# Patient Record
Sex: Female | Born: 1940 | Race: Black or African American | Hispanic: No | Smoking: Never smoker
Health system: Southern US, Community
[De-identification: ages and names within clinical notes are randomized; demographics above are authoritative.]

## PROBLEM LIST (undated history)

## (undated) DIAGNOSIS — G709 Myoneural disorder, unspecified: Secondary | ICD-10-CM

## (undated) DIAGNOSIS — I509 Heart failure, unspecified: Secondary | ICD-10-CM

## (undated) DIAGNOSIS — R0600 Dyspnea, unspecified: Secondary | ICD-10-CM

## (undated) DIAGNOSIS — J189 Pneumonia, unspecified organism: Secondary | ICD-10-CM

## (undated) DIAGNOSIS — I272 Pulmonary hypertension, unspecified: Secondary | ICD-10-CM

## (undated) DIAGNOSIS — I10 Essential (primary) hypertension: Secondary | ICD-10-CM

## (undated) DIAGNOSIS — N189 Chronic kidney disease, unspecified: Secondary | ICD-10-CM

## (undated) DIAGNOSIS — E119 Type 2 diabetes mellitus without complications: Secondary | ICD-10-CM

## (undated) HISTORY — DX: Chronic kidney disease, unspecified: N18.9

## (undated) HISTORY — PX: TONSILLECTOMY: SUR1361

---

## 2013-04-23 ENCOUNTER — Ambulatory Visit: Payer: Self-pay | Admitting: Podiatry

## 2013-04-25 ENCOUNTER — Encounter: Payer: Self-pay | Admitting: Podiatry

## 2013-04-25 ENCOUNTER — Ambulatory Visit (INDEPENDENT_AMBULATORY_CARE_PROVIDER_SITE_OTHER): Payer: Medicare Other | Admitting: Podiatry

## 2013-04-25 DIAGNOSIS — B351 Tinea unguium: Secondary | ICD-10-CM

## 2013-04-25 DIAGNOSIS — M79609 Pain in unspecified limb: Secondary | ICD-10-CM

## 2013-04-25 NOTE — Patient Instructions (Signed)
Diabetes and Foot Care Diabetes may cause you to have problems because of poor blood supply (circulation) to your feet and legs. This may cause the skin on your feet to become thinner, break easier, and heal more slowly. Your skin may become dry, and the skin may peel and crack. You may also have nerve damage in your legs and feet causing decreased feeling in them. You may not notice minor injuries to your feet that could lead to infections or more serious problems. Taking care of your feet is one of the most important things you can do for yourself.  HOME CARE INSTRUCTIONS  Wear shoes at all times, even in the house. Do not go barefoot. Bare feet are easily injured.  Check your feet daily for blisters, cuts, and redness. If you cannot see the bottom of your feet, use a mirror or ask someone for help.  Wash your feet with warm water (do not use hot water) and mild soap. Then pat your feet and the areas between your toes until they are completely dry. Do not soak your feet as this can dry your skin.  Apply a moisturizing lotion or petroleum jelly (that does not contain alcohol and is unscented) to the skin on your feet and to dry, brittle toenails. Do not apply lotion between your toes.  Trim your toenails straight across. Do not dig under them or around the cuticle. File the edges of your nails with an emery board or nail file.  Do not cut corns or calluses or try to remove them with medicine.  Wear clean socks or stockings every day. Make sure they are not too tight. Do not wear knee-high stockings since they may decrease blood flow to your legs.  Wear shoes that fit properly and have enough cushioning. To break in new shoes, wear them for just a few hours a day. This prevents you from injuring your feet. Always look in your shoes before you put them on to be sure there are no objects inside.  Do not cross your legs. This may decrease the blood flow to your feet.  If you find a minor scrape,  cut, or break in the skin on your feet, keep it and the skin around it clean and dry. These areas may be cleansed with mild soap and water. Do not cleanse the area with peroxide, alcohol, or iodine.  When you remove an adhesive bandage, be sure not to damage the skin around it.  If you have a wound, look at it several times a day to make sure it is healing.  Do not use heating pads or hot water bottles. They may burn your skin. If you have lost feeling in your feet or legs, you may not know it is happening until it is too late.  Make sure your health care provider performs a complete foot exam at least annually or more often if you have foot problems. Report any cuts, sores, or bruises to your health care provider immediately. SEEK MEDICAL CARE IF:   You have an injury that is not healing.  You have cuts or breaks in the skin.  You have an ingrown nail.  You notice redness on your legs or feet.  You feel burning or tingling in your legs or feet.  You have pain or cramps in your legs and feet.  Your legs or feet are numb.  Your feet always feel cold. SEEK IMMEDIATE MEDICAL CARE IF:   There is increasing redness,   swelling, or pain in or around a wound.  There is a red line that goes up your leg.  Pus is coming from a wound.  You develop a fever or as directed by your health care provider.  You notice a bad smell coming from an ulcer or wound. Document Released: 04/16/2000 Document Revised: 12/20/2012 Document Reviewed: 09/26/2012 [employer] Patient Information 2014 [employer].  

## 2013-04-25 NOTE — Progress Notes (Signed)
   Subjective:    Patient ID: [name], female    DOB: [birth year], 72 y.o.   MRN: [id number]  HPI Comments: ''TOENAILS TRIM.''  Patient presents complaining of painful toenails. The last visit for this service was made 29 2014   Review of Systems     Objective:   Physical Exam Orientated x27 72 year old known diabetic  Dermatological: Hypertrophic, elongated, incurvated, discolored toenails x10.       Assessment & Plan:   Assessment: Symptomatic onychomycoses x10  Plan: All 10 toenails are debrided back without any bleeding. Reappoint at patient's request.

## 2013-07-25 ENCOUNTER — Encounter: Payer: Self-pay | Admitting: Podiatry

## 2013-07-25 ENCOUNTER — Ambulatory Visit (INDEPENDENT_AMBULATORY_CARE_PROVIDER_SITE_OTHER): Payer: Medicare Other | Admitting: Podiatry

## 2013-07-25 VITALS — BP 172/86 | HR 82 | Resp 12

## 2013-07-25 DIAGNOSIS — B351 Tinea unguium: Secondary | ICD-10-CM

## 2013-07-25 DIAGNOSIS — M79609 Pain in unspecified limb: Secondary | ICD-10-CM

## 2013-07-26 NOTE — Progress Notes (Signed)
Patient ID: [name], female   DOB: 1941/04/27, 73 y.o.   MRN: [id number]  Subjective: This patient presents for ongoing debridement of painful mycotic toenails  Objective: Elongated, hypertrophic, discolored toenails with palpable tenderness x10  Assessment: Symptomatic onychomycoses x10  Plan: Nails x10 are debrided without any bleeding. Reappoint at three-month intervals.

## 2013-10-24 ENCOUNTER — Ambulatory Visit: Payer: Medicare Other | Admitting: Podiatry

## 2013-11-05 ENCOUNTER — Ambulatory Visit: Payer: Medicare Other | Admitting: Podiatry

## 2013-11-19 ENCOUNTER — Encounter: Payer: Self-pay | Admitting: Podiatry

## 2013-11-19 ENCOUNTER — Ambulatory Visit (INDEPENDENT_AMBULATORY_CARE_PROVIDER_SITE_OTHER): Payer: Medicare Other | Admitting: Podiatry

## 2013-11-19 VITALS — BP 158/74 | HR 77 | Resp 18

## 2013-11-19 DIAGNOSIS — M79609 Pain in unspecified limb: Secondary | ICD-10-CM

## 2013-11-19 DIAGNOSIS — M79673 Pain in unspecified foot: Secondary | ICD-10-CM

## 2013-11-19 DIAGNOSIS — B351 Tinea unguium: Secondary | ICD-10-CM

## 2013-11-19 NOTE — Progress Notes (Signed)
Patient ID: [name], female   DOB: 11-Aug-1940, 73 y.o.   MRN: [id number]  Subjective: This patient presents complaining of painful toenails  Objective: Orientated x3 Hypertrophic, elongated, incurvated toenails 6-10  Assessment: Symptomatic onychomycoses 6-10  Plan: Nails x10 are debrided without a bleeding  Reappoint at three-month

## 2014-02-25 ENCOUNTER — Encounter: Payer: Self-pay | Admitting: Podiatry

## 2014-02-25 ENCOUNTER — Ambulatory Visit (INDEPENDENT_AMBULATORY_CARE_PROVIDER_SITE_OTHER): Payer: Medicare Other | Admitting: Podiatry

## 2014-02-25 DIAGNOSIS — M79676 Pain in unspecified toe(s): Secondary | ICD-10-CM

## 2014-02-25 DIAGNOSIS — B351 Tinea unguium: Secondary | ICD-10-CM

## 2014-02-26 NOTE — Progress Notes (Signed)
Patient ID: [name], female   DOB: 1941/03/23, 73 y.o.   MRN: [id number]  Subjective: This patient presents again complaining of painful toenails  Objective: The toenails are hypertrophic, elongated, discolored 6-10  Assessment: Symptomatic onychomycosis 6-10  Plan: Debridement of nails 10 without any bleeding  Reappointed 3 months

## 2014-06-03 ENCOUNTER — Ambulatory Visit (INDEPENDENT_AMBULATORY_CARE_PROVIDER_SITE_OTHER): Payer: Medicare Other | Admitting: Podiatry

## 2014-06-03 ENCOUNTER — Encounter: Payer: Self-pay | Admitting: Podiatry

## 2014-06-03 DIAGNOSIS — B351 Tinea unguium: Secondary | ICD-10-CM | POA: Diagnosis not present

## 2014-06-03 DIAGNOSIS — M79676 Pain in unspecified toe(s): Secondary | ICD-10-CM | POA: Diagnosis not present

## 2014-06-03 NOTE — Patient Instructions (Signed)
Apply skin lotion to the backside right and left heels twice a day  Diabetes and Foot Care Diabetes may cause you to have problems because of poor blood supply (circulation) to your feet and legs. This may cause the skin on your feet to become thinner, break easier, and heal more slowly. Your skin may become dry, and the skin may peel and crack. You may also have nerve damage in your legs and feet causing decreased feeling in them. You may not notice minor injuries to your feet that could lead to infections or more serious problems. Taking care of your feet is one of the most important things you can do for yourself.  HOME CARE INSTRUCTIONS  Wear shoes at all times, even in the house. Do not go barefoot. Bare feet are easily injured.  Check your feet daily for blisters, cuts, and redness. If you cannot see the bottom of your feet, use a mirror or ask someone for help.  Wash your feet with warm water (do not use hot water) and mild soap. Then pat your feet and the areas between your toes until they are completely dry. Do not soak your feet as this can dry your skin.  Apply a moisturizing lotion or petroleum jelly (that does not contain alcohol and is unscented) to the skin on your feet and to dry, brittle toenails. Do not apply lotion between your toes.  Trim your toenails straight across. Do not dig under them or around the cuticle. File the edges of your nails with an emery board or nail file.  Do not cut corns or calluses or try to remove them with medicine.  Wear clean socks or stockings every day. Make sure they are not too tight. Do not wear knee-high stockings since they may decrease blood flow to your legs.  Wear shoes that fit properly and have enough cushioning. To break in new shoes, wear them for just a few hours a day. This prevents you from injuring your feet. Always look in your shoes before you put them on to be sure there are no objects inside.  Do not cross your legs. This may  decrease the blood flow to your feet.  If you find a minor scrape, cut, or break in the skin on your feet, keep it and the skin around it clean and dry. These areas may be cleansed with mild soap and water. Do not cleanse the area with peroxide, alcohol, or iodine.  When you remove an adhesive bandage, be sure not to damage the skin around it.  If you have a wound, look at it several times a day to make sure it is healing.  Do not use heating pads or hot water bottles. They may burn your skin. If you have lost feeling in your feet or legs, you may not know it is happening until it is too late.  Make sure your health care provider performs a complete foot exam at least annually or more often if you have foot problems. Report any cuts, sores, or bruises to your health care provider immediately. SEEK MEDICAL CARE IF:   You have an injury that is not healing.  You have cuts or breaks in the skin.  You have an ingrown nail.  You notice redness on your legs or feet.  You feel burning or tingling in your legs or feet.  You have pain or cramps in your legs and feet.  Your legs or feet are numb.  Your feet  always feel cold. SEEK IMMEDIATE MEDICAL CARE IF:   There is increasing redness, swelling, or pain in or around a wound.  There is a red line that goes up your leg.  Pus is coming from a wound.  You develop a fever or as directed by your health care provider.  You notice a bad smell coming from an ulcer or wound. Document Released: 04/16/2000 Document Revised: 12/20/2012 Document Reviewed: 09/26/2012 [facility] Patient Information 2015 [place], [place]. This information is not intended to replace advice given to you by your health care provider. Make sure you discuss any questions you have with your health care provider.

## 2014-06-03 NOTE — Progress Notes (Signed)
Patient ID: [name], female   DOB: 1941/02/13, 74 y.o.   MRN: [id number]  Subjective: This patient presents today complaining of painful toenails and scaling in the heels bilaterally  Objective: The toenails are elongated, hypertrophic, incurvated and tender to palpation 6-10 Dry scaling skin posterior heels bilaterally  Assessment: Symptomatic onychomycoses 6-10 Xerosis heels bilaterally  Plan: Debridement of toenails 10 without a bleeding Advised patient apply skin lotion to heels twice a day  Reappoint 3 months

## 2014-09-09 ENCOUNTER — Encounter: Payer: Self-pay | Admitting: Podiatry

## 2014-09-09 ENCOUNTER — Ambulatory Visit (INDEPENDENT_AMBULATORY_CARE_PROVIDER_SITE_OTHER): Payer: Medicare Other | Admitting: Podiatry

## 2014-09-09 DIAGNOSIS — M79676 Pain in unspecified toe(s): Secondary | ICD-10-CM

## 2014-09-09 DIAGNOSIS — B351 Tinea unguium: Secondary | ICD-10-CM

## 2014-09-09 NOTE — Patient Instructions (Signed)
Diabetes and Foot Care Diabetes may cause you to have problems because of poor blood supply (circulation) to your feet and legs. This may cause the skin on your feet to become thinner, break easier, and heal more slowly. Your skin may become dry, and the skin may peel and crack. You may also have nerve damage in your legs and feet causing decreased feeling in them. You may not notice minor injuries to your feet that could lead to infections or more serious problems. Taking care of your feet is one of the most important things you can do for yourself.  HOME CARE INSTRUCTIONS  Wear shoes at all times, even in the house. Do not go barefoot. Bare feet are easily injured.  Check your feet daily for blisters, cuts, and redness. If you cannot see the bottom of your feet, use a mirror or ask someone for help.  Wash your feet with warm water (do not use hot water) and mild soap. Then pat your feet and the areas between your toes until they are completely dry. Do not soak your feet as this can dry your skin.  Apply a moisturizing lotion or petroleum jelly (that does not contain alcohol and is unscented) to the skin on your feet and to dry, brittle toenails. Do not apply lotion between your toes.  Trim your toenails straight across. Do not dig under them or around the cuticle. File the edges of your nails with an emery board or nail file.  Do not cut corns or calluses or try to remove them with medicine.  Wear clean socks or stockings every day. Make sure they are not too tight. Do not wear knee-high stockings since they may decrease blood flow to your legs.  Wear shoes that fit properly and have enough cushioning. To break in new shoes, wear them for just a few hours a day. This prevents you from injuring your feet. Always look in your shoes before you put them on to be sure there are no objects inside.  Do not cross your legs. This may decrease the blood flow to your feet.  If you find a minor scrape,  cut, or break in the skin on your feet, keep it and the skin around it clean and dry. These areas may be cleansed with mild soap and water. Do not cleanse the area with peroxide, alcohol, or iodine.  When you remove an adhesive bandage, be sure not to damage the skin around it.  If you have a wound, look at it several times a day to make sure it is healing.  Do not use heating pads or hot water bottles. They may burn your skin. If you have lost feeling in your feet or legs, you may not know it is happening until it is too late.  Make sure your health care provider performs a complete foot exam at least annually or more often if you have foot problems. Report any cuts, sores, or bruises to your health care provider immediately. SEEK MEDICAL CARE IF:   You have an injury that is not healing.  You have cuts or breaks in the skin.  You have an ingrown nail.  You notice redness on your legs or feet.  You feel burning or tingling in your legs or feet.  You have pain or cramps in your legs and feet.  Your legs or feet are numb.  Your feet always feel cold. SEEK IMMEDIATE MEDICAL CARE IF:   There is increasing redness,   swelling, or pain in or around a wound.  There is a red line that goes up your leg.  Pus is coming from a wound.  You develop a fever or as directed by your health care provider.  You notice a bad smell coming from an ulcer or wound. Document Released: 04/16/2000 Document Revised: 12/20/2012 Document Reviewed: 09/26/2012 [employer] Patient Information 2015 [employer]. This information is not intended to replace advice given to you by your health care provider. Make sure you discuss any questions you have with your health care provider.  

## 2014-09-10 NOTE — Progress Notes (Signed)
Patient ID: [name], female   DOB: [birth year], 74 y.o.   MRN: [id number]  Subjective: This patient presents again complaining of painful toenails and requesting nail debridement  Objective: The toenails are hypertrophic, elongated, incurvated and tender to direct palpation 6-10  Assessment: Symptomatic onychomycoses 6-10 Type II diabetic  Plan: Debridement of toenails 10 without any bleeding  Reappoint 3 months

## 2014-12-17 ENCOUNTER — Telehealth: Payer: Self-pay | Admitting: *Deleted

## 2014-12-17 NOTE — Telephone Encounter (Signed)
Pt states she would like to change her 12/18/2014 1115am appt to 12/19/2014 at 1115am.

## 2014-12-18 ENCOUNTER — Encounter: Payer: Self-pay | Admitting: Podiatry

## 2014-12-18 ENCOUNTER — Ambulatory Visit (INDEPENDENT_AMBULATORY_CARE_PROVIDER_SITE_OTHER): Payer: Medicare Other | Admitting: Podiatry

## 2014-12-18 DIAGNOSIS — B351 Tinea unguium: Secondary | ICD-10-CM

## 2014-12-18 DIAGNOSIS — M79676 Pain in unspecified toe(s): Secondary | ICD-10-CM | POA: Diagnosis not present

## 2014-12-18 NOTE — Patient Instructions (Signed)
Diabetes and Foot Care Diabetes may cause you to have problems because of poor blood supply (circulation) to your feet and legs. This may cause the skin on your feet to become thinner, break easier, and heal more slowly. Your skin may become dry, and the skin may peel and crack. You may also have nerve damage in your legs and feet causing decreased feeling in them. You may not notice minor injuries to your feet that could lead to infections or more serious problems. Taking care of your feet is one of the most important things you can do for yourself.  HOME CARE INSTRUCTIONS  Wear shoes at all times, even in the house. Do not go barefoot. Bare feet are easily injured.  Check your feet daily for blisters, cuts, and redness. If you cannot see the bottom of your feet, use a mirror or ask someone for help.  Wash your feet with warm water (do not use hot water) and mild soap. Then pat your feet and the areas between your toes until they are completely dry. Do not soak your feet as this can dry your skin.  Apply a moisturizing lotion or petroleum jelly (that does not contain alcohol and is unscented) to the skin on your feet and to dry, brittle toenails. Do not apply lotion between your toes.  Trim your toenails straight across. Do not dig under them or around the cuticle. File the edges of your nails with an emery board or nail file.  Do not cut corns or calluses or try to remove them with medicine.  Wear clean socks or stockings every day. Make sure they are not too tight. Do not wear knee-high stockings since they may decrease blood flow to your legs.  Wear shoes that fit properly and have enough cushioning. To break in new shoes, wear them for just a few hours a day. This prevents you from injuring your feet. Always look in your shoes before you put them on to be sure there are no objects inside.  Do not cross your legs. This may decrease the blood flow to your feet.  If you find a minor scrape,  cut, or break in the skin on your feet, keep it and the skin around it clean and dry. These areas may be cleansed with mild soap and water. Do not cleanse the area with peroxide, alcohol, or iodine.  When you remove an adhesive bandage, be sure not to damage the skin around it.  If you have a wound, look at it several times a day to make sure it is healing.  Do not use heating pads or hot water bottles. They may burn your skin. If you have lost feeling in your feet or legs, you may not know it is happening until it is too late.  Make sure your health care provider performs a complete foot exam at least annually or more often if you have foot problems. Report any cuts, sores, or bruises to your health care provider immediately. SEEK MEDICAL CARE IF:   You have an injury that is not healing.  You have cuts or breaks in the skin.  You have an ingrown nail.  You notice redness on your legs or feet.  You feel burning or tingling in your legs or feet.  You have pain or cramps in your legs and feet.  Your legs or feet are numb.  Your feet always feel cold. SEEK IMMEDIATE MEDICAL CARE IF:   There is increasing redness,   swelling, or pain in or around a wound.  There is a red line that goes up your leg.  Pus is coming from a wound.  You develop a fever or as directed by your health care provider.  You notice a bad smell coming from an ulcer or wound. Document Released: 04/16/2000 Document Revised: 12/20/2012 Document Reviewed: 09/26/2012 [employer] Patient Information 2015 [employer]. This information is not intended to replace advice given to you by your health care provider. Make sure you discuss any questions you have with your health care provider.  

## 2014-12-19 NOTE — Progress Notes (Signed)
Patient ID: [name], female   DOB: 1940-08-16, 74 y.o.   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

## 2015-03-18 ENCOUNTER — Encounter: Payer: Self-pay | Admitting: Podiatry

## 2015-03-18 ENCOUNTER — Ambulatory Visit (INDEPENDENT_AMBULATORY_CARE_PROVIDER_SITE_OTHER): Payer: Medicare Other | Admitting: Podiatry

## 2015-03-18 VITALS — BP 142/86 | HR 69 | Resp 12

## 2015-03-18 DIAGNOSIS — T300 Burn of unspecified body region, unspecified degree: Secondary | ICD-10-CM

## 2015-03-18 DIAGNOSIS — B351 Tinea unguium: Secondary | ICD-10-CM | POA: Diagnosis not present

## 2015-03-18 DIAGNOSIS — M79676 Pain in unspecified toe(s): Secondary | ICD-10-CM

## 2015-03-18 MED ORDER — SILVER SULFADIAZINE 1 % EX CREA: 1.0000 "application " | TOPICAL_CREAM | Freq: Every day | CUTANEOUS | Status: DC

## 2015-03-18 NOTE — Patient Instructions (Signed)
Apply Silvadene cream daily to burn on right ankle on cover with gauze until healed Diabetes and Foot Care Diabetes may cause you to have problems because of poor blood supply (circulation) to your feet and legs. This may cause the skin on your feet to become thinner, break easier, and heal more slowly. Your skin may become dry, and the skin may peel and crack. You may also have nerve damage in your legs and feet causing decreased feeling in them. You may not notice minor injuries to your feet that could lead to infections or more serious problems. Taking care of your feet is one of the most important things you can do for yourself.  HOME CARE INSTRUCTIONS  Wear shoes at all times, even in the house. Do not go barefoot. Bare feet are easily injured.  Check your feet daily for blisters, cuts, and redness. If you cannot see the bottom of your feet, use a mirror or ask someone for help.  Wash your feet with warm water (do not use hot water) and mild soap. Then pat your feet and the areas between your toes until they are completely dry. Do not soak your feet as this can dry your skin.  Apply a moisturizing lotion or petroleum jelly (that does not contain alcohol and is unscented) to the skin on your feet and to dry, brittle toenails. Do not apply lotion between your toes.  Trim your toenails straight across. Do not dig under them or around the cuticle. File the edges of your nails with an emery board or nail file.  Do not cut corns or calluses or try to remove them with medicine.  Wear clean socks or stockings every day. Make sure they are not too tight. Do not wear knee-high stockings since they may decrease blood flow to your legs.  Wear shoes that fit properly and have enough cushioning. To break in new shoes, wear them for just a few hours a day. This prevents you from injuring your feet. Always look in your shoes before you put them on to be sure there are no objects inside.  Do not cross your  legs. This may decrease the blood flow to your feet.  If you find a minor scrape, cut, or break in the skin on your feet, keep it and the skin around it clean and dry. These areas may be cleansed with mild soap and water. Do not cleanse the area with peroxide, alcohol, or iodine.  When you remove an adhesive bandage, be sure not to damage the skin around it.  If you have a wound, look at it several times a day to make sure it is healing.  Do not use heating pads or hot water bottles. They may burn your skin. If you have lost feeling in your feet or legs, you may not know it is happening until it is too late.  Make sure your health care provider performs a complete foot exam at least annually or more often if you have foot problems. Report any cuts, sores, or bruises to your health care provider immediately. SEEK MEDICAL CARE IF:   You have an injury that is not healing.  You have cuts or breaks in the skin.  You have an ingrown nail.  You notice redness on your legs or feet.  You feel burning or tingling in your legs or feet.  You have pain or cramps in your legs and feet.  Your legs or feet are numb.  Your  feet always feel cold. SEEK IMMEDIATE MEDICAL CARE IF:   There is increasing redness, swelling, or pain in or around a wound.  There is a red line that goes up your leg.  Pus is coming from a wound.  You develop a fever or as directed by your health care provider.  You notice a bad smell coming from an ulcer or wound.   This information is not intended to replace advice given to you by your health care provider. Make sure you discuss any questions you have with your health care provider.   Document Released: 04/16/2000 Document Revised: 12/20/2012 Document Reviewed: 09/26/2012 Elsevier Interactive Patient Education [employer].

## 2015-03-18 NOTE — Progress Notes (Signed)
   Subjective:    Patient ID: [name], female    DOB: 10-25-1940, 74 y.o.   MRN: [id number]  HPI   This patient presents today for a scheduled visit for debridement of painful toenails which are on-call for walking wearing shoes. She also was complaining of a draining itching area that resulted from spilling grease from cooking on the  right ankle area approximately a week ago. She says at this time the wound is improving, however, it is itching and she is applying some over-the-counter medication for burns. He says that the areas improved since the injury  Review of Systems  Skin: Positive for color change and rash.       Objective:   Physical Exam    orientated 3 Anterior right ankle has superficial scaling with a granular fibrotic base 3 x 1 cm. There is no surrounding erythema, edema, warmth or active drainage surrounding this wound  The toenails are elongated, hypertrophic, discolored, and tender to direct palpation 6-10      Assessment & Plan:   Assessment: First-degree burn anterior right ankle without clinical sign of infection Mycotic toenails 6-10  Plan: Today I Rx Silvadene cream for patient to apply at least once daily and cover with gauze until the wound site heels The toenails 6-10 were debrided mechanically and electrically without any bleeding  Reappoint 3 months

## 2015-06-17 ENCOUNTER — Ambulatory Visit (INDEPENDENT_AMBULATORY_CARE_PROVIDER_SITE_OTHER): Payer: Medicare Other | Admitting: Podiatry

## 2015-06-17 ENCOUNTER — Encounter: Payer: Self-pay | Admitting: Podiatry

## 2015-06-17 DIAGNOSIS — M79676 Pain in unspecified toe(s): Secondary | ICD-10-CM

## 2015-06-17 DIAGNOSIS — B351 Tinea unguium: Secondary | ICD-10-CM

## 2015-06-17 NOTE — Progress Notes (Signed)
Patient ID: [name], female   DOB: 12-Apr-1941, 75 y.o.   MRN: [id number]  Subjective: As patient presents again today for scheduled visit complaining that her toenails are thickened and elongated uncomfortable walking wearing shoes and request toenail debridement  Objective: Orientated 3 No open skin lesions bilaterally The toenails are elongated, discolored, incurvated, deformed, nontender direct palpation 6-10  Assessment: Symptomatic onychomycoses 6-10 Diabetic  Plan: Debridement toenails 6-10 mechanically and electrically without any bleeding  Reappoint 3 months

## 2015-06-17 NOTE — Patient Instructions (Signed)
Diabetes and Foot Care Diabetes may cause you to have problems because of poor blood supply (circulation) to your feet and legs. This may cause the skin on your feet to become thinner, break easier, and heal more slowly. Your skin may become dry, and the skin may peel and crack. You may also have nerve damage in your legs and feet causing decreased feeling in them. You may not notice minor injuries to your feet that could lead to infections or more serious problems. Taking care of your feet is one of the most important things you can do for yourself.  HOME CARE INSTRUCTIONS  Wear shoes at all times, even in the house. Do not go barefoot. Bare feet are easily injured.  Check your feet daily for blisters, cuts, and redness. If you cannot see the bottom of your feet, use a mirror or ask someone for help.  Wash your feet with warm water (do not use hot water) and mild soap. Then pat your feet and the areas between your toes until they are completely dry. Do not soak your feet as this can dry your skin.  Apply a moisturizing lotion or petroleum jelly (that does not contain alcohol and is unscented) to the skin on your feet and to dry, brittle toenails. Do not apply lotion between your toes.  Trim your toenails straight across. Do not dig under them or around the cuticle. File the edges of your nails with an emery board or nail file.  Do not cut corns or calluses or try to remove them with medicine.  Wear clean socks or stockings every day. Make sure they are not too tight. Do not wear knee-high stockings since they may decrease blood flow to your legs.  Wear shoes that fit properly and have enough cushioning. To break in new shoes, wear them for just a few hours a day. This prevents you from injuring your feet. Always look in your shoes before you put them on to be sure there are no objects inside.  Do not cross your legs. This may decrease the blood flow to your feet.  If you find a minor scrape,  cut, or break in the skin on your feet, keep it and the skin around it clean and dry. These areas may be cleansed with mild soap and water. Do not cleanse the area with peroxide, alcohol, or iodine.  When you remove an adhesive bandage, be sure not to damage the skin around it.  If you have a wound, look at it several times a day to make sure it is healing.  Do not use heating pads or hot water bottles. They may burn your skin. If you have lost feeling in your feet or legs, you may not know it is happening until it is too late.  Make sure your health care provider performs a complete foot exam at least annually or more often if you have foot problems. Report any cuts, sores, or bruises to your health care provider immediately. SEEK MEDICAL CARE IF:   You have an injury that is not healing.  You have cuts or breaks in the skin.  You have an ingrown nail.  You notice redness on your legs or feet.  You feel burning or tingling in your legs or feet.  You have pain or cramps in your legs and feet.  Your legs or feet are numb.  Your feet always feel cold. SEEK IMMEDIATE MEDICAL CARE IF:   There is increasing redness,   swelling, or pain in or around a wound.  There is a red line that goes up your leg.  Pus is coming from a wound.  You develop a fever or as directed by your health care provider.  You notice a bad smell coming from an ulcer or wound.   This information is not intended to replace advice given to you by your health care provider. Make sure you discuss any questions you have with your health care provider.   Document Released: 04/16/2000 Document Revised: 12/20/2012 Document Reviewed: 09/26/2012 Elsevier Interactive Patient Education 2016 [employer]  

## 2015-09-16 ENCOUNTER — Ambulatory Visit (INDEPENDENT_AMBULATORY_CARE_PROVIDER_SITE_OTHER): Payer: Medicare Other | Admitting: Podiatry

## 2015-09-16 ENCOUNTER — Encounter: Payer: Self-pay | Admitting: Podiatry

## 2015-09-16 DIAGNOSIS — M79676 Pain in unspecified toe(s): Secondary | ICD-10-CM

## 2015-09-16 DIAGNOSIS — B351 Tinea unguium: Secondary | ICD-10-CM

## 2015-09-16 NOTE — Patient Instructions (Signed)
Diabetes and Foot Care Diabetes may cause you to have problems because of poor blood supply (circulation) to your feet and legs. This may cause the skin on your feet to become thinner, break easier, and heal more slowly. Your skin may become dry, and the skin may peel and crack. You may also have nerve damage in your legs and feet causing decreased feeling in them. You may not notice minor injuries to your feet that could lead to infections or more serious problems. Taking care of your feet is one of the most important things you can do for yourself.  HOME CARE INSTRUCTIONS  Wear shoes at all times, even in the house. Do not go barefoot. Bare feet are easily injured.  Check your feet daily for blisters, cuts, and redness. If you cannot see the bottom of your feet, use a mirror or ask someone for help.  Wash your feet with warm water (do not use hot water) and mild soap. Then pat your feet and the areas between your toes until they are completely dry. Do not soak your feet as this can dry your skin.  Apply a moisturizing lotion or petroleum jelly (that does not contain alcohol and is unscented) to the skin on your feet and to dry, brittle toenails. Do not apply lotion between your toes.  Trim your toenails straight across. Do not dig under them or around the cuticle. File the edges of your nails with an emery board or nail file.  Do not cut corns or calluses or try to remove them with medicine.  Wear clean socks or stockings every day. Make sure they are not too tight. Do not wear knee-high stockings since they may decrease blood flow to your legs.  Wear shoes that fit properly and have enough cushioning. To break in new shoes, wear them for just a few hours a day. This prevents you from injuring your feet. Always look in your shoes before you put them on to be sure there are no objects inside.  Do not cross your legs. This may decrease the blood flow to your feet.  If you find a minor scrape,  cut, or break in the skin on your feet, keep it and the skin around it clean and dry. These areas may be cleansed with mild soap and water. Do not cleanse the area with peroxide, alcohol, or iodine.  When you remove an adhesive bandage, be sure not to damage the skin around it.  If you have a wound, look at it several times a day to make sure it is healing.  Do not use heating pads or hot water bottles. They may burn your skin. If you have lost feeling in your feet or legs, you may not know it is happening until it is too late.  Make sure your health care provider performs a complete foot exam at least annually or more often if you have foot problems. Report any cuts, sores, or bruises to your health care provider immediately. SEEK MEDICAL CARE IF:   You have an injury that is not healing.  You have cuts or breaks in the skin.  You have an ingrown nail.  You notice redness on your legs or feet.  You feel burning or tingling in your legs or feet.  You have pain or cramps in your legs and feet.  Your legs or feet are numb.  Your feet always feel cold. SEEK IMMEDIATE MEDICAL CARE IF:   There is increasing redness,   swelling, or pain in or around a wound.  There is a red line that goes up your leg.  Pus is coming from a wound.  You develop a fever or as directed by your health care provider.  You notice a bad smell coming from an ulcer or wound.   This information is not intended to replace advice given to you by your health care provider. Make sure you discuss any questions you have with your health care provider.   Document Released: 04/16/2000 Document Revised: 12/20/2012 Document Reviewed: 09/26/2012 Elsevier Interactive Patient Education 2016 [employer]  

## 2015-09-16 NOTE — Progress Notes (Signed)
Patient ID: [name], female   DOB: 05-06-1940, 75 y.o.   MRN: [id number]  Subjective: As patient presents again today for scheduled visit complaining that her toenails are thickened and elongated uncomfortable walking wearing shoes and request toenail debridement  Objective: Orientated 3 No open skin lesions bilaterally The toenails are elongated, discolored, incurvated, deformed, nontender direct palpation 6-10  Assessment: Symptomatic onychomycoses 6-10 Diabetic  Plan: Debridement toenails 6-10 mechanically and electrically without any bleeding  Reappoint 3 months

## 2015-12-23 ENCOUNTER — Ambulatory Visit: Payer: Medicare Other | Admitting: Podiatry

## 2016-01-07 ENCOUNTER — Encounter: Payer: Self-pay | Admitting: Podiatry

## 2016-01-07 ENCOUNTER — Ambulatory Visit (INDEPENDENT_AMBULATORY_CARE_PROVIDER_SITE_OTHER): Payer: Medicare Other | Admitting: Podiatry

## 2016-01-07 DIAGNOSIS — M79676 Pain in unspecified toe(s): Secondary | ICD-10-CM | POA: Diagnosis not present

## 2016-01-07 DIAGNOSIS — B351 Tinea unguium: Secondary | ICD-10-CM

## 2016-01-07 NOTE — Progress Notes (Signed)
Patient ID: [name], female   DOB: 10-11-1940, 75 y.o.   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

## 2016-01-07 NOTE — Patient Instructions (Signed)
Diabetes and Foot Care Diabetes may cause you to have problems because of poor blood supply (circulation) to your feet and legs. This may cause the skin on your feet to become thinner, break easier, and heal more slowly. Your skin may become dry, and the skin may peel and crack. You may also have nerve damage in your legs and feet causing decreased feeling in them. You may not notice minor injuries to your feet that could lead to infections or more serious problems. Taking care of your feet is one of the most important things you can do for yourself.  HOME CARE INSTRUCTIONS  Wear shoes at all times, even in the house. Do not go barefoot. Bare feet are easily injured.  Check your feet daily for blisters, cuts, and redness. If you cannot see the bottom of your feet, use a mirror or ask someone for help.  Wash your feet with warm water (do not use hot water) and mild soap. Then pat your feet and the areas between your toes until they are completely dry. Do not soak your feet as this can dry your skin.  Apply a moisturizing lotion or petroleum jelly (that does not contain alcohol and is unscented) to the skin on your feet and to dry, brittle toenails. Do not apply lotion between your toes.  Trim your toenails straight across. Do not dig under them or around the cuticle. File the edges of your nails with an emery board or nail file.  Do not cut corns or calluses or try to remove them with medicine.  Wear clean socks or stockings every day. Make sure they are not too tight. Do not wear knee-high stockings since they may decrease blood flow to your legs.  Wear shoes that fit properly and have enough cushioning. To break in new shoes, wear them for just a few hours a day. This prevents you from injuring your feet. Always look in your shoes before you put them on to be sure there are no objects inside.  Do not cross your legs. This may decrease the blood flow to your feet.  If you find a minor scrape,  cut, or break in the skin on your feet, keep it and the skin around it clean and dry. These areas may be cleansed with mild soap and water. Do not cleanse the area with peroxide, alcohol, or iodine.  When you remove an adhesive bandage, be sure not to damage the skin around it.  If you have a wound, look at it several times a day to make sure it is healing.  Do not use heating pads or hot water bottles. They may burn your skin. If you have lost feeling in your feet or legs, you may not know it is happening until it is too late.  Make sure your health care provider performs a complete foot exam at least annually or more often if you have foot problems. Report any cuts, sores, or bruises to your health care provider immediately. SEEK MEDICAL CARE IF:   You have an injury that is not healing.  You have cuts or breaks in the skin.  You have an ingrown nail.  You notice redness on your legs or feet.  You feel burning or tingling in your legs or feet.  You have pain or cramps in your legs and feet.  Your legs or feet are numb.  Your feet always feel cold. SEEK IMMEDIATE MEDICAL CARE IF:   There is increasing redness,   swelling, or pain in or around a wound.  There is a red line that goes up your leg.  Pus is coming from a wound.  You develop a fever or as directed by your health care provider.  You notice a bad smell coming from an ulcer or wound.   This information is not intended to replace advice given to you by your health care provider. Make sure you discuss any questions you have with your health care provider.   Document Released: 04/16/2000 Document Revised: 12/20/2012 Document Reviewed: 09/26/2012 Elsevier Interactive Patient Education 2016 [employer]  

## 2016-04-07 ENCOUNTER — Encounter: Payer: Self-pay | Admitting: Podiatry

## 2016-04-07 ENCOUNTER — Ambulatory Visit (INDEPENDENT_AMBULATORY_CARE_PROVIDER_SITE_OTHER): Payer: Medicare Other | Admitting: Podiatry

## 2016-04-07 DIAGNOSIS — B351 Tinea unguium: Secondary | ICD-10-CM | POA: Diagnosis not present

## 2016-04-07 DIAGNOSIS — M79676 Pain in unspecified toe(s): Secondary | ICD-10-CM | POA: Diagnosis not present

## 2016-04-07 NOTE — Patient Instructions (Signed)

## 2016-04-07 NOTE — Progress Notes (Signed)
Patient ID: [name], female   DOB: 02-28-1941, 75 y.o.   MRN: [id number]     Subjective: As patient presents again today for scheduled visit complaining that her toenails are thickened and elongated uncomfortable walking wearing shoes and request toenail debridement  Objective: Orientated 3   nonpitting edema bilaterally DP and PT pulses 2/4 bilaterally Reflex immediate bilaterally Sensation to 10 g monofilament wire intact 1/5 right and 3/5 left Vibratory sensation nonreactive bilaterally Ankle reflexes reactive bilaterally No open skin lesions bilaterally The toenails are elongated, discolored, incurvated, deformed, nontender direct palpation 6-10  Assessment: 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

## 2016-07-06 ENCOUNTER — Encounter: Payer: Self-pay | Admitting: Podiatry

## 2016-07-06 ENCOUNTER — Ambulatory Visit (INDEPENDENT_AMBULATORY_CARE_PROVIDER_SITE_OTHER): Payer: Medicare Other | Admitting: Podiatry

## 2016-07-06 DIAGNOSIS — B351 Tinea unguium: Secondary | ICD-10-CM

## 2016-07-06 DIAGNOSIS — M79676 Pain in unspecified toe(s): Secondary | ICD-10-CM | POA: Diagnosis not present

## 2016-07-06 DIAGNOSIS — E1142 Type 2 diabetes mellitus with diabetic polyneuropathy: Secondary | ICD-10-CM

## 2016-07-06 NOTE — Patient Instructions (Signed)

## 2016-07-06 NOTE — Progress Notes (Signed)
Patient ID: [name], female   DOB: Jul 18, 1940, 76 y.o.   MRN: [id number]    Subjective: As patient presents again today for scheduled visit complaining that her toenails are thickened and elongated uncomfortable walking wearing shoes and request toenail debridement  Objective: Orientated 3  nonpitting edema bilaterally DP and PT pulses 2/4 bilaterally Reflex immediate bilaterally Sensation to 10 g monofilament wire intact 1/5 right and 3/5 left Vibratory sensation nonreactive bilaterally Ankle reflexes reactive bilaterally No open skin lesions bilaterally The toenails are elongated, discolored, incurvated, deformed, nontender direct palpation 6-10 Manual motor testing dorsi flexion, plantar flexion 5/5 bilaterally  Assessment: 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

## 2016-10-06 ENCOUNTER — Ambulatory Visit: Payer: Medicare Other | Admitting: Podiatry

## 2016-10-26 ENCOUNTER — Ambulatory Visit (INDEPENDENT_AMBULATORY_CARE_PROVIDER_SITE_OTHER): Payer: Medicare Other | Admitting: Podiatry

## 2016-10-26 ENCOUNTER — Encounter: Payer: Self-pay | Admitting: Podiatry

## 2016-10-26 DIAGNOSIS — M79676 Pain in unspecified toe(s): Secondary | ICD-10-CM | POA: Diagnosis not present

## 2016-10-26 DIAGNOSIS — B351 Tinea unguium: Secondary | ICD-10-CM

## 2016-10-26 DIAGNOSIS — E1142 Type 2 diabetes mellitus with diabetic polyneuropathy: Secondary | ICD-10-CM

## 2016-10-26 NOTE — Progress Notes (Signed)
Patient ID: [name], female   DOB: 04-20-1941, 76 y.o.   MRN: [id number]   Subjective: As patient presents again today for scheduled visit complaining that her toenails are thickened and elongated uncomfortable walking wearing shoes and request toenail debridement  Objective: Orientated 3 nonpitting edema bilaterally DP and PT pulses 2/4 bilaterally Reflex immediate bilaterally Sensation to 10 g monofilament wire intact 1/5 right and 3/5 left Vibratory sensation nonreactive bilaterally Ankle reflexes reactive bilaterally No open skin lesions bilaterally Absent hair growth bilaterally The toenails are elongated, discolored, incurvated, deformed, nontender direct palpation 6-10 Manual motor testing dorsi flexion, plantar flexion 5/5 bilaterally  Assessment: 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

## 2016-10-26 NOTE — Patient Instructions (Signed)

## 2017-01-24 ENCOUNTER — Encounter: Payer: Self-pay | Admitting: Podiatry

## 2017-01-24 ENCOUNTER — Ambulatory Visit (INDEPENDENT_AMBULATORY_CARE_PROVIDER_SITE_OTHER): Payer: Medicare Other | Admitting: Podiatry

## 2017-01-24 DIAGNOSIS — B351 Tinea unguium: Secondary | ICD-10-CM

## 2017-01-24 DIAGNOSIS — E1142 Type 2 diabetes mellitus with diabetic polyneuropathy: Secondary | ICD-10-CM

## 2017-01-24 DIAGNOSIS — M79676 Pain in unspecified toe(s): Secondary | ICD-10-CM

## 2017-01-24 NOTE — Progress Notes (Signed)
Patient ID: [name], female   DOB: Jun 02, 1940, 76 y.o.   MRN: [id number]    Subjective: As patient presents again today for scheduled visit complaining that her toenails are thickened and elongated uncomfortable walking wearing shoes and request toenail debridement  Objective: Orientated 3 nonpitting edema bilaterally DP and PT pulses 2/4 bilaterally Reflex immediate bilaterally Sensation to 10 g monofilament wire intact 1/5 right and 3/5 left Vibratory sensation nonreactive bilaterally Ankle reflexes reactive bilaterally No open skin lesions bilaterally Absent hair growth bilaterally The toenails are elongated, discolored, incurvated, deformed, nontender direct palpation 6-10 Manual motor testing dorsi flexion, plantar flexion 5/5 bilaterally  Assessment: 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

## 2017-01-24 NOTE — Patient Instructions (Signed)

## 2017-01-26 ENCOUNTER — Ambulatory Visit: Payer: Medicare Other | Admitting: Podiatry

## 2017-05-09 ENCOUNTER — Ambulatory Visit: Payer: Medicare Other | Admitting: Podiatry

## 2017-05-20 DIAGNOSIS — I129 Hypertensive chronic kidney disease with stage 1 through stage 4 chronic kidney disease, or unspecified chronic kidney disease: Secondary | ICD-10-CM | POA: Diagnosis not present

## 2017-05-20 DIAGNOSIS — N183 Chronic kidney disease, stage 3 (moderate): Secondary | ICD-10-CM | POA: Diagnosis not present

## 2017-05-20 DIAGNOSIS — Z6827 Body mass index (BMI) 27.0-27.9, adult: Secondary | ICD-10-CM | POA: Diagnosis not present

## 2017-05-20 DIAGNOSIS — E1122 Type 2 diabetes mellitus with diabetic chronic kidney disease: Secondary | ICD-10-CM | POA: Diagnosis not present

## 2017-06-15 ENCOUNTER — Ambulatory Visit (INDEPENDENT_AMBULATORY_CARE_PROVIDER_SITE_OTHER): Payer: Medicare HMO | Admitting: Podiatry

## 2017-06-15 ENCOUNTER — Encounter: Payer: Self-pay | Admitting: Podiatry

## 2017-06-15 DIAGNOSIS — M79674 Pain in right toe(s): Secondary | ICD-10-CM

## 2017-06-15 DIAGNOSIS — B351 Tinea unguium: Secondary | ICD-10-CM | POA: Diagnosis not present

## 2017-06-15 DIAGNOSIS — M79675 Pain in left toe(s): Secondary | ICD-10-CM

## 2017-06-15 DIAGNOSIS — E1143 Type 2 diabetes mellitus with diabetic autonomic (poly)neuropathy: Secondary | ICD-10-CM

## 2017-06-15 DIAGNOSIS — M205X1 Other deformities of toe(s) (acquired), right foot: Secondary | ICD-10-CM

## 2017-06-15 NOTE — Progress Notes (Addendum)
Complaint:  Visit Type: Patient returns to my office for continued preventative foot care services. Complaint: Patient states" my nails have grown long and thick and become painful to walk and wear shoes" Patient has been diagnosed with DM with neuropathy.. The patient presents for preventative foot care services. No changes to ROS  Podiatric Exam: Vascular: dorsalis pedis and posterior tibial pulses are palpable bilateral. Capillary return is immediate. Temperature gradient is WNL. Skin turgor WNL  Sensorium: Diminished  Semmes Weinstein monofilament test. Diminished  tactile sensation bilaterally. Nail Exam: Pt has thick disfigured discolored nails with subungual debris noted bilateral entire nail hallux through fifth toenails Ulcer Exam: There is no evidence of ulcer or pre-ulcerative changes or infection. Orthopedic Exam: Muscle tone and strength are WNL. No limitations in general ROM. No crepitus or effusions noted. Foot type and digits show no abnormalities. Hallux limitus right foot. Skin: No Porokeratosis. 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  Treatment & Plan Procedures and Treatment: Consent by patient was obtained for treatment procedures.   Debridement of mycotic and hypertrophic toenails, 1 through 5 bilateral and clearing of subungual debris. No ulceration, no infection noted. 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 Return Visit-Office Procedure: Patient instructed to return to the office for a follow up visit 3 months for continued evaluation and treatment.    Gardiner Barefoot DPM

## 2017-06-16 ENCOUNTER — Ambulatory Visit: Payer: Medicare HMO | Admitting: Orthotics

## 2017-06-16 DIAGNOSIS — M205X1 Other deformities of toe(s) (acquired), right foot: Secondary | ICD-10-CM

## 2017-06-16 DIAGNOSIS — E1142 Type 2 diabetes mellitus with diabetic polyneuropathy: Secondary | ICD-10-CM

## 2017-06-16 DIAGNOSIS — E1143 Type 2 diabetes mellitus with diabetic autonomic (poly)neuropathy: Secondary | ICD-10-CM

## 2017-06-16 NOTE — Progress Notes (Signed)
Patient presents today for diabetic shoe measurement and foam casting.  Goals of diabetic shoes/inserts to offer protection from conditions secondary to DM2, offer relief from sheer forces that could lead to ulcerations, protect the foot, and offer greater stability. 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

## 2017-07-01 DIAGNOSIS — Z8249 Family history of ischemic heart disease and other diseases of the circulatory system: Secondary | ICD-10-CM | POA: Diagnosis not present

## 2017-07-01 DIAGNOSIS — Z833 Family history of diabetes mellitus: Secondary | ICD-10-CM | POA: Diagnosis not present

## 2017-07-01 DIAGNOSIS — I10 Essential (primary) hypertension: Secondary | ICD-10-CM | POA: Diagnosis not present

## 2017-07-01 DIAGNOSIS — E119 Type 2 diabetes mellitus without complications: Secondary | ICD-10-CM | POA: Diagnosis not present

## 2017-07-01 DIAGNOSIS — Z7984 Long term (current) use of oral hypoglycemic drugs: Secondary | ICD-10-CM | POA: Diagnosis not present

## 2017-07-01 DIAGNOSIS — Z6828 Body mass index (BMI) 28.0-28.9, adult: Secondary | ICD-10-CM | POA: Diagnosis not present

## 2017-07-01 DIAGNOSIS — E663 Overweight: Secondary | ICD-10-CM | POA: Diagnosis not present

## 2017-07-01 DIAGNOSIS — Z9889 Other specified postprocedural states: Secondary | ICD-10-CM | POA: Diagnosis not present

## 2017-07-01 DIAGNOSIS — Z823 Family history of stroke: Secondary | ICD-10-CM | POA: Diagnosis not present

## 2017-07-11 DIAGNOSIS — N183 Chronic kidney disease, stage 3 (moderate): Secondary | ICD-10-CM | POA: Diagnosis not present

## 2017-07-11 DIAGNOSIS — E119 Type 2 diabetes mellitus without complications: Secondary | ICD-10-CM | POA: Diagnosis not present

## 2017-07-11 DIAGNOSIS — I119 Hypertensive heart disease without heart failure: Secondary | ICD-10-CM | POA: Diagnosis not present

## 2017-07-11 DIAGNOSIS — E559 Vitamin D deficiency, unspecified: Secondary | ICD-10-CM | POA: Diagnosis not present

## 2017-07-11 DIAGNOSIS — I10 Essential (primary) hypertension: Secondary | ICD-10-CM | POA: Diagnosis not present

## 2017-07-11 DIAGNOSIS — E785 Hyperlipidemia, unspecified: Secondary | ICD-10-CM | POA: Diagnosis not present

## 2017-07-13 DIAGNOSIS — R69 Illness, unspecified: Secondary | ICD-10-CM | POA: Diagnosis not present

## 2017-08-05 DIAGNOSIS — M545 Low back pain: Secondary | ICD-10-CM | POA: Diagnosis not present

## 2017-08-05 DIAGNOSIS — M542 Cervicalgia: Secondary | ICD-10-CM | POA: Diagnosis not present

## 2017-08-05 DIAGNOSIS — M9903 Segmental and somatic dysfunction of lumbar region: Secondary | ICD-10-CM | POA: Diagnosis not present

## 2017-08-05 DIAGNOSIS — M9901 Segmental and somatic dysfunction of cervical region: Secondary | ICD-10-CM | POA: Diagnosis not present

## 2017-08-12 DIAGNOSIS — Z1231 Encounter for screening mammogram for malignant neoplasm of breast: Secondary | ICD-10-CM | POA: Diagnosis not present

## 2017-08-15 DIAGNOSIS — H35373 Puckering of macula, bilateral: Secondary | ICD-10-CM | POA: Diagnosis not present

## 2017-08-15 DIAGNOSIS — H35341 Macular cyst, hole, or pseudohole, right eye: Secondary | ICD-10-CM | POA: Diagnosis not present

## 2017-08-15 DIAGNOSIS — E113551 Type 2 diabetes mellitus with stable proliferative diabetic retinopathy, right eye: Secondary | ICD-10-CM | POA: Diagnosis not present

## 2017-08-15 DIAGNOSIS — E113522 Type 2 diabetes mellitus with proliferative diabetic retinopathy with traction retinal detachment involving the macula, left eye: Secondary | ICD-10-CM | POA: Diagnosis not present

## 2017-08-15 DIAGNOSIS — Z961 Presence of intraocular lens: Secondary | ICD-10-CM | POA: Diagnosis not present

## 2017-08-18 DIAGNOSIS — M542 Cervicalgia: Secondary | ICD-10-CM | POA: Diagnosis not present

## 2017-08-18 DIAGNOSIS — M9901 Segmental and somatic dysfunction of cervical region: Secondary | ICD-10-CM | POA: Diagnosis not present

## 2017-08-18 DIAGNOSIS — M9903 Segmental and somatic dysfunction of lumbar region: Secondary | ICD-10-CM | POA: Diagnosis not present

## 2017-08-18 DIAGNOSIS — M545 Low back pain: Secondary | ICD-10-CM | POA: Diagnosis not present

## 2017-08-30 DIAGNOSIS — E113532 Type 2 diabetes mellitus with proliferative diabetic retinopathy with traction retinal detachment not involving the macula, left eye: Secondary | ICD-10-CM | POA: Diagnosis not present

## 2017-08-30 DIAGNOSIS — E113522 Type 2 diabetes mellitus with proliferative diabetic retinopathy with traction retinal detachment involving the macula, left eye: Secondary | ICD-10-CM | POA: Diagnosis not present

## 2017-09-02 DIAGNOSIS — M545 Low back pain: Secondary | ICD-10-CM | POA: Diagnosis not present

## 2017-09-02 DIAGNOSIS — M9903 Segmental and somatic dysfunction of lumbar region: Secondary | ICD-10-CM | POA: Diagnosis not present

## 2017-09-02 DIAGNOSIS — M542 Cervicalgia: Secondary | ICD-10-CM | POA: Diagnosis not present

## 2017-09-02 DIAGNOSIS — M9901 Segmental and somatic dysfunction of cervical region: Secondary | ICD-10-CM | POA: Diagnosis not present

## 2017-09-07 DIAGNOSIS — R69 Illness, unspecified: Secondary | ICD-10-CM | POA: Diagnosis not present

## 2017-09-14 ENCOUNTER — Encounter: Payer: Self-pay | Admitting: Podiatry

## 2017-09-14 ENCOUNTER — Ambulatory Visit: Payer: Medicare HMO | Admitting: Podiatry

## 2017-09-14 DIAGNOSIS — M79674 Pain in right toe(s): Secondary | ICD-10-CM | POA: Diagnosis not present

## 2017-09-14 DIAGNOSIS — E1142 Type 2 diabetes mellitus with diabetic polyneuropathy: Secondary | ICD-10-CM | POA: Diagnosis not present

## 2017-09-14 DIAGNOSIS — M79675 Pain in left toe(s): Secondary | ICD-10-CM | POA: Diagnosis not present

## 2017-09-14 DIAGNOSIS — B351 Tinea unguium: Secondary | ICD-10-CM | POA: Diagnosis not present

## 2017-09-14 DIAGNOSIS — M205X1 Other deformities of toe(s) (acquired), right foot: Secondary | ICD-10-CM

## 2017-09-14 NOTE — Progress Notes (Addendum)
Complaint:  Visit Type: Patient returns to my office for continued preventative foot care services. Complaint: Patient states" my nails have grown long and thick and become painful to walk and wear shoes" Patient has been diagnosed with DM with neuropathy.. The patient presents for preventative foot care services. No changes to ROS  Podiatric Exam: Vascular: dorsalis pedis and posterior tibial pulses are palpable bilateral. Capillary return is immediate. Temperature gradient is WNL. Skin turgor WNL  Sensorium: Diminished  Semmes Weinstein monofilament test. Diminished  tactile sensation bilaterally. Nail Exam: Pt has thick disfigured discolored nails with subungual debris noted bilateral entire nail hallux through fifth toenails Ulcer Exam: There is no evidence of ulcer or pre-ulcerative changes or infection. Orthopedic Exam: Muscle tone and strength are WNL. No limitations in general ROM. No crepitus or effusions noted. Foot type and digits show no abnormalities. Hallux limitus right foot. Skin: No Porokeratosis. 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  Treatment & Plan Procedures and Treatment: Consent by patient was obtained for treatment procedures.   Debridement of mycotic and hypertrophic toenails, 1 through 5 bilateral and clearing of subungual debris. No ulceration, no infection noted. 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 Return Visit-Office Procedure: Patient instructed to return to the office for a follow up visit 3 months for continued evaluation and treatment.    Gardiner Barefoot DPM

## 2017-09-16 DIAGNOSIS — M542 Cervicalgia: Secondary | ICD-10-CM | POA: Diagnosis not present

## 2017-09-16 DIAGNOSIS — M545 Low back pain: Secondary | ICD-10-CM | POA: Diagnosis not present

## 2017-09-16 DIAGNOSIS — M9903 Segmental and somatic dysfunction of lumbar region: Secondary | ICD-10-CM | POA: Diagnosis not present

## 2017-09-16 DIAGNOSIS — M9901 Segmental and somatic dysfunction of cervical region: Secondary | ICD-10-CM | POA: Diagnosis not present

## 2017-10-11 DIAGNOSIS — I10 Essential (primary) hypertension: Secondary | ICD-10-CM | POA: Diagnosis not present

## 2017-10-11 DIAGNOSIS — E559 Vitamin D deficiency, unspecified: Secondary | ICD-10-CM | POA: Diagnosis not present

## 2017-10-11 DIAGNOSIS — E785 Hyperlipidemia, unspecified: Secondary | ICD-10-CM | POA: Diagnosis not present

## 2017-10-11 DIAGNOSIS — E119 Type 2 diabetes mellitus without complications: Secondary | ICD-10-CM | POA: Diagnosis not present

## 2017-10-11 DIAGNOSIS — N183 Chronic kidney disease, stage 3 (moderate): Secondary | ICD-10-CM | POA: Diagnosis not present

## 2017-10-11 DIAGNOSIS — I119 Hypertensive heart disease without heart failure: Secondary | ICD-10-CM | POA: Diagnosis not present

## 2017-10-11 DIAGNOSIS — Z0000 Encounter for general adult medical examination without abnormal findings: Secondary | ICD-10-CM | POA: Diagnosis not present

## 2017-10-27 DIAGNOSIS — R69 Illness, unspecified: Secondary | ICD-10-CM | POA: Diagnosis not present

## 2017-10-28 DIAGNOSIS — M545 Low back pain: Secondary | ICD-10-CM | POA: Diagnosis not present

## 2017-10-28 DIAGNOSIS — M542 Cervicalgia: Secondary | ICD-10-CM | POA: Diagnosis not present

## 2017-10-28 DIAGNOSIS — M9903 Segmental and somatic dysfunction of lumbar region: Secondary | ICD-10-CM | POA: Diagnosis not present

## 2017-10-28 DIAGNOSIS — M9901 Segmental and somatic dysfunction of cervical region: Secondary | ICD-10-CM | POA: Diagnosis not present

## 2017-11-21 ENCOUNTER — Ambulatory Visit (INDEPENDENT_AMBULATORY_CARE_PROVIDER_SITE_OTHER): Payer: Medicare HMO | Admitting: Orthotics

## 2017-11-21 DIAGNOSIS — E113512 Type 2 diabetes mellitus with proliferative diabetic retinopathy with macular edema, left eye: Secondary | ICD-10-CM | POA: Diagnosis not present

## 2017-11-21 DIAGNOSIS — E1142 Type 2 diabetes mellitus with diabetic polyneuropathy: Secondary | ICD-10-CM

## 2017-11-21 DIAGNOSIS — M205X1 Other deformities of toe(s) (acquired), right foot: Secondary | ICD-10-CM

## 2017-11-21 DIAGNOSIS — M79674 Pain in right toe(s): Secondary | ICD-10-CM

## 2017-11-21 DIAGNOSIS — E113511 Type 2 diabetes mellitus with proliferative diabetic retinopathy with macular edema, right eye: Secondary | ICD-10-CM | POA: Diagnosis not present

## 2017-11-21 DIAGNOSIS — H338 Other retinal detachments: Secondary | ICD-10-CM | POA: Diagnosis not present

## 2017-11-21 DIAGNOSIS — B351 Tinea unguium: Secondary | ICD-10-CM | POA: Diagnosis not present

## 2017-11-21 DIAGNOSIS — M205X2 Other deformities of toe(s) (acquired), left foot: Secondary | ICD-10-CM | POA: Diagnosis not present

## 2017-11-21 DIAGNOSIS — M79675 Pain in left toe(s): Secondary | ICD-10-CM

## 2017-11-21 DIAGNOSIS — Z961 Presence of intraocular lens: Secondary | ICD-10-CM | POA: Diagnosis not present

## 2017-11-21 DIAGNOSIS — H35373 Puckering of macula, bilateral: Secondary | ICD-10-CM | POA: Diagnosis not present

## 2017-11-21 NOTE — Progress Notes (Signed)

## 2017-11-29 DIAGNOSIS — Z0000 Encounter for general adult medical examination without abnormal findings: Secondary | ICD-10-CM | POA: Diagnosis not present

## 2017-11-29 DIAGNOSIS — E785 Hyperlipidemia, unspecified: Secondary | ICD-10-CM | POA: Diagnosis not present

## 2017-11-29 DIAGNOSIS — I119 Hypertensive heart disease without heart failure: Secondary | ICD-10-CM | POA: Diagnosis not present

## 2017-11-29 DIAGNOSIS — E559 Vitamin D deficiency, unspecified: Secondary | ICD-10-CM | POA: Diagnosis not present

## 2017-11-29 DIAGNOSIS — N183 Chronic kidney disease, stage 3 (moderate): Secondary | ICD-10-CM | POA: Diagnosis not present

## 2017-11-29 DIAGNOSIS — E119 Type 2 diabetes mellitus without complications: Secondary | ICD-10-CM | POA: Diagnosis not present

## 2017-11-29 DIAGNOSIS — I10 Essential (primary) hypertension: Secondary | ICD-10-CM | POA: Diagnosis not present

## 2017-11-29 DIAGNOSIS — Z136 Encounter for screening for cardiovascular disorders: Secondary | ICD-10-CM | POA: Diagnosis not present

## 2017-11-29 DIAGNOSIS — Z01118 Encounter for examination of ears and hearing with other abnormal findings: Secondary | ICD-10-CM | POA: Diagnosis not present

## 2017-12-02 DIAGNOSIS — M9903 Segmental and somatic dysfunction of lumbar region: Secondary | ICD-10-CM | POA: Diagnosis not present

## 2017-12-02 DIAGNOSIS — M542 Cervicalgia: Secondary | ICD-10-CM | POA: Diagnosis not present

## 2017-12-02 DIAGNOSIS — M9901 Segmental and somatic dysfunction of cervical region: Secondary | ICD-10-CM | POA: Diagnosis not present

## 2017-12-02 DIAGNOSIS — M545 Low back pain: Secondary | ICD-10-CM | POA: Diagnosis not present

## 2017-12-06 ENCOUNTER — Other Ambulatory Visit: Payer: Self-pay | Admitting: Internal Medicine

## 2017-12-06 DIAGNOSIS — R69 Illness, unspecified: Secondary | ICD-10-CM | POA: Diagnosis not present

## 2017-12-06 DIAGNOSIS — E2839 Other primary ovarian failure: Secondary | ICD-10-CM

## 2017-12-07 DIAGNOSIS — E113511 Type 2 diabetes mellitus with proliferative diabetic retinopathy with macular edema, right eye: Secondary | ICD-10-CM | POA: Diagnosis not present

## 2017-12-14 ENCOUNTER — Ambulatory Visit: Payer: Medicare HMO | Admitting: Podiatry

## 2017-12-14 ENCOUNTER — Encounter: Payer: Self-pay | Admitting: Podiatry

## 2017-12-14 DIAGNOSIS — M79675 Pain in left toe(s): Secondary | ICD-10-CM | POA: Diagnosis not present

## 2017-12-14 DIAGNOSIS — B351 Tinea unguium: Secondary | ICD-10-CM

## 2017-12-14 DIAGNOSIS — E1142 Type 2 diabetes mellitus with diabetic polyneuropathy: Secondary | ICD-10-CM | POA: Diagnosis not present

## 2017-12-14 DIAGNOSIS — M79674 Pain in right toe(s): Secondary | ICD-10-CM

## 2017-12-14 NOTE — Progress Notes (Signed)
Complaint:  Visit Type: Patient returns to my office for continued preventative foot care services. Complaint: Patient states" my nails have grown long and thick and become painful to walk and wear shoes" Patient has been diagnosed with DM with neuropathy.. The patient presents for preventative foot care services. No changes to ROS  Podiatric Exam: Vascular: dorsalis pedis and posterior tibial pulses are palpable bilateral. Capillary return is immediate. Temperature gradient is WNL. Skin turgor WNL  Sensorium: Diminished  Semmes Weinstein monofilament test. Diminished  tactile sensation bilaterally. Nail Exam: Pt has thick disfigured discolored nails with subungual debris noted bilateral entire nail hallux through fifth toenails Ulcer Exam: There is no evidence of ulcer or pre-ulcerative changes or infection. Orthopedic Exam: Muscle tone and strength are WNL. No limitations in general ROM. No crepitus or effusions noted. Foot type and digits show no abnormalities. Hallux limitus right foot. Skin: No Porokeratosis. No infection or ulcers  Diagnosis:  Onychomycosis, , Pain in right toe, pain in left toes Diabetic neuropathy  Treatment & Plan Procedures and Treatment: Consent by patient was obtained for treatment procedures.   Debridement of mycotic and hypertrophic toenails, 1 through 5 bilateral and clearing of subungual debris. No ulceration, no infection noted. 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 Return Visit-Office Procedure: Patient instructed to return to the office for a follow up visit 3 months for continued evaluation and treatment.    Gardiner Barefoot DPM

## 2017-12-23 DIAGNOSIS — M545 Low back pain: Secondary | ICD-10-CM | POA: Diagnosis not present

## 2017-12-23 DIAGNOSIS — M542 Cervicalgia: Secondary | ICD-10-CM | POA: Diagnosis not present

## 2017-12-23 DIAGNOSIS — M9901 Segmental and somatic dysfunction of cervical region: Secondary | ICD-10-CM | POA: Diagnosis not present

## 2017-12-23 DIAGNOSIS — M9903 Segmental and somatic dysfunction of lumbar region: Secondary | ICD-10-CM | POA: Diagnosis not present

## 2017-12-27 DIAGNOSIS — M542 Cervicalgia: Secondary | ICD-10-CM | POA: Diagnosis not present

## 2017-12-27 DIAGNOSIS — M545 Low back pain: Secondary | ICD-10-CM | POA: Diagnosis not present

## 2017-12-27 DIAGNOSIS — M9903 Segmental and somatic dysfunction of lumbar region: Secondary | ICD-10-CM | POA: Diagnosis not present

## 2017-12-27 DIAGNOSIS — M9901 Segmental and somatic dysfunction of cervical region: Secondary | ICD-10-CM | POA: Diagnosis not present

## 2017-12-30 DIAGNOSIS — E113512 Type 2 diabetes mellitus with proliferative diabetic retinopathy with macular edema, left eye: Secondary | ICD-10-CM | POA: Diagnosis not present

## 2018-01-03 DIAGNOSIS — M9901 Segmental and somatic dysfunction of cervical region: Secondary | ICD-10-CM | POA: Diagnosis not present

## 2018-01-03 DIAGNOSIS — M542 Cervicalgia: Secondary | ICD-10-CM | POA: Diagnosis not present

## 2018-01-03 DIAGNOSIS — M9903 Segmental and somatic dysfunction of lumbar region: Secondary | ICD-10-CM | POA: Diagnosis not present

## 2018-01-03 DIAGNOSIS — M545 Low back pain: Secondary | ICD-10-CM | POA: Diagnosis not present

## 2018-01-06 DIAGNOSIS — M4856XD Collapsed vertebra, not elsewhere classified, lumbar region, subsequent encounter for fracture with routine healing: Secondary | ICD-10-CM | POA: Diagnosis not present

## 2018-01-06 DIAGNOSIS — M542 Cervicalgia: Secondary | ICD-10-CM | POA: Diagnosis not present

## 2018-01-06 DIAGNOSIS — M47816 Spondylosis without myelopathy or radiculopathy, lumbar region: Secondary | ICD-10-CM | POA: Diagnosis not present

## 2018-01-06 DIAGNOSIS — M5136 Other intervertebral disc degeneration, lumbar region: Secondary | ICD-10-CM | POA: Diagnosis not present

## 2018-01-06 DIAGNOSIS — M4316 Spondylolisthesis, lumbar region: Secondary | ICD-10-CM | POA: Diagnosis not present

## 2018-01-06 DIAGNOSIS — M9903 Segmental and somatic dysfunction of lumbar region: Secondary | ICD-10-CM | POA: Diagnosis not present

## 2018-01-06 DIAGNOSIS — M9901 Segmental and somatic dysfunction of cervical region: Secondary | ICD-10-CM | POA: Diagnosis not present

## 2018-01-06 DIAGNOSIS — M545 Low back pain: Secondary | ICD-10-CM | POA: Diagnosis not present

## 2018-01-10 DIAGNOSIS — M545 Low back pain: Secondary | ICD-10-CM | POA: Diagnosis not present

## 2018-01-10 DIAGNOSIS — M9903 Segmental and somatic dysfunction of lumbar region: Secondary | ICD-10-CM | POA: Diagnosis not present

## 2018-01-10 DIAGNOSIS — M9901 Segmental and somatic dysfunction of cervical region: Secondary | ICD-10-CM | POA: Diagnosis not present

## 2018-01-10 DIAGNOSIS — M542 Cervicalgia: Secondary | ICD-10-CM | POA: Diagnosis not present

## 2018-01-24 DIAGNOSIS — N183 Chronic kidney disease, stage 3 (moderate): Secondary | ICD-10-CM | POA: Diagnosis not present

## 2018-01-24 DIAGNOSIS — E119 Type 2 diabetes mellitus without complications: Secondary | ICD-10-CM | POA: Diagnosis not present

## 2018-01-24 DIAGNOSIS — E785 Hyperlipidemia, unspecified: Secondary | ICD-10-CM | POA: Diagnosis not present

## 2018-01-24 DIAGNOSIS — Z2821 Immunization not carried out because of patient refusal: Secondary | ICD-10-CM | POA: Diagnosis not present

## 2018-01-24 DIAGNOSIS — E559 Vitamin D deficiency, unspecified: Secondary | ICD-10-CM | POA: Diagnosis not present

## 2018-01-24 DIAGNOSIS — I119 Hypertensive heart disease without heart failure: Secondary | ICD-10-CM | POA: Diagnosis not present

## 2018-01-24 DIAGNOSIS — I10 Essential (primary) hypertension: Secondary | ICD-10-CM | POA: Diagnosis not present

## 2018-01-24 DIAGNOSIS — E1142 Type 2 diabetes mellitus with diabetic polyneuropathy: Secondary | ICD-10-CM | POA: Diagnosis not present

## 2018-02-02 DIAGNOSIS — R94120 Abnormal auditory function study: Secondary | ICD-10-CM | POA: Diagnosis not present

## 2018-02-02 DIAGNOSIS — H9201 Otalgia, right ear: Secondary | ICD-10-CM | POA: Insufficient documentation

## 2018-02-02 DIAGNOSIS — H938X2 Other specified disorders of left ear: Secondary | ICD-10-CM | POA: Diagnosis not present

## 2018-02-02 DIAGNOSIS — H6121 Impacted cerumen, right ear: Secondary | ICD-10-CM | POA: Diagnosis not present

## 2018-02-02 DIAGNOSIS — Z972 Presence of dental prosthetic device (complete) (partial): Secondary | ICD-10-CM | POA: Diagnosis not present

## 2018-02-06 DIAGNOSIS — R69 Illness, unspecified: Secondary | ICD-10-CM | POA: Diagnosis not present

## 2018-02-17 DIAGNOSIS — M9903 Segmental and somatic dysfunction of lumbar region: Secondary | ICD-10-CM | POA: Diagnosis not present

## 2018-02-17 DIAGNOSIS — M545 Low back pain: Secondary | ICD-10-CM | POA: Diagnosis not present

## 2018-02-17 DIAGNOSIS — M542 Cervicalgia: Secondary | ICD-10-CM | POA: Diagnosis not present

## 2018-02-17 DIAGNOSIS — M9901 Segmental and somatic dysfunction of cervical region: Secondary | ICD-10-CM | POA: Diagnosis not present

## 2018-03-03 DIAGNOSIS — M545 Low back pain: Secondary | ICD-10-CM | POA: Diagnosis not present

## 2018-03-03 DIAGNOSIS — M9903 Segmental and somatic dysfunction of lumbar region: Secondary | ICD-10-CM | POA: Diagnosis not present

## 2018-03-03 DIAGNOSIS — M9901 Segmental and somatic dysfunction of cervical region: Secondary | ICD-10-CM | POA: Diagnosis not present

## 2018-03-03 DIAGNOSIS — M542 Cervicalgia: Secondary | ICD-10-CM | POA: Diagnosis not present

## 2018-03-14 DIAGNOSIS — I10 Essential (primary) hypertension: Secondary | ICD-10-CM | POA: Diagnosis not present

## 2018-03-14 DIAGNOSIS — E559 Vitamin D deficiency, unspecified: Secondary | ICD-10-CM | POA: Diagnosis not present

## 2018-03-14 DIAGNOSIS — Z7689 Persons encountering health services in other specified circumstances: Secondary | ICD-10-CM | POA: Diagnosis not present

## 2018-03-14 DIAGNOSIS — E119 Type 2 diabetes mellitus without complications: Secondary | ICD-10-CM | POA: Diagnosis not present

## 2018-03-14 DIAGNOSIS — E785 Hyperlipidemia, unspecified: Secondary | ICD-10-CM | POA: Diagnosis not present

## 2018-03-14 DIAGNOSIS — N183 Chronic kidney disease, stage 3 (moderate): Secondary | ICD-10-CM | POA: Diagnosis not present

## 2018-03-14 DIAGNOSIS — E1142 Type 2 diabetes mellitus with diabetic polyneuropathy: Secondary | ICD-10-CM | POA: Diagnosis not present

## 2018-03-14 DIAGNOSIS — I119 Hypertensive heart disease without heart failure: Secondary | ICD-10-CM | POA: Diagnosis not present

## 2018-03-15 ENCOUNTER — Ambulatory Visit: Payer: Medicare HMO | Admitting: Podiatry

## 2018-03-15 ENCOUNTER — Encounter: Payer: Self-pay | Admitting: Podiatry

## 2018-03-15 DIAGNOSIS — M79674 Pain in right toe(s): Secondary | ICD-10-CM

## 2018-03-15 DIAGNOSIS — B351 Tinea unguium: Secondary | ICD-10-CM | POA: Diagnosis not present

## 2018-03-15 DIAGNOSIS — M79675 Pain in left toe(s): Secondary | ICD-10-CM | POA: Diagnosis not present

## 2018-03-15 DIAGNOSIS — E1142 Type 2 diabetes mellitus with diabetic polyneuropathy: Secondary | ICD-10-CM

## 2018-03-15 NOTE — Progress Notes (Signed)
Complaint:  Visit Type: Patient returns to my office for continued preventative foot care services. Complaint: Patient states" my nails have grown long and thick and become painful to walk and wear shoes" Patient has been diagnosed with DM with neuropathy.. The patient presents for preventative foot care services. No changes to ROS  Podiatric Exam: Vascular: dorsalis pedis and posterior tibial pulses are palpable bilateral. Capillary return is immediate. Temperature gradient is WNL. Skin turgor WNL  Sensorium: Diminished  Semmes Weinstein monofilament test. Diminished  tactile sensation bilaterally. Nail Exam: Pt has thick disfigured discolored nails with subungual debris noted bilateral entire nail hallux through fifth toenails Ulcer Exam: There is no evidence of ulcer or pre-ulcerative changes or infection. Orthopedic Exam: Muscle tone and strength are WNL. No limitations in general ROM. No crepitus or effusions noted. Foot type and digits show no abnormalities. Hallux limitus right foot. Skin: No Porokeratosis. No infection or ulcers  Diagnosis:  Onychomycosis, , Pain in right toe, pain in left toes Diabetic neuropathy  Treatment & Plan Procedures and Treatment: Consent by patient was obtained for treatment procedures.   Debridement of mycotic and hypertrophic toenails, 1 through 5 bilateral and clearing of subungual debris. No ulceration, no infection noted. 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 Return Visit-Office Procedure: Patient instructed to return to the office for a follow up visit 3 months for continued evaluation and treatment.    Gardiner Barefoot DPM

## 2018-03-24 DIAGNOSIS — M9903 Segmental and somatic dysfunction of lumbar region: Secondary | ICD-10-CM | POA: Diagnosis not present

## 2018-03-24 DIAGNOSIS — M545 Low back pain: Secondary | ICD-10-CM | POA: Diagnosis not present

## 2018-03-24 DIAGNOSIS — M542 Cervicalgia: Secondary | ICD-10-CM | POA: Diagnosis not present

## 2018-03-24 DIAGNOSIS — M9901 Segmental and somatic dysfunction of cervical region: Secondary | ICD-10-CM | POA: Diagnosis not present

## 2018-04-04 DIAGNOSIS — E7800 Pure hypercholesterolemia, unspecified: Secondary | ICD-10-CM | POA: Diagnosis not present

## 2018-04-04 DIAGNOSIS — Z2821 Immunization not carried out because of patient refusal: Secondary | ICD-10-CM | POA: Diagnosis not present

## 2018-04-04 DIAGNOSIS — E1142 Type 2 diabetes mellitus with diabetic polyneuropathy: Secondary | ICD-10-CM | POA: Diagnosis not present

## 2018-04-04 DIAGNOSIS — N183 Chronic kidney disease, stage 3 (moderate): Secondary | ICD-10-CM | POA: Diagnosis not present

## 2018-04-04 DIAGNOSIS — I10 Essential (primary) hypertension: Secondary | ICD-10-CM | POA: Diagnosis not present

## 2018-04-04 DIAGNOSIS — I119 Hypertensive heart disease without heart failure: Secondary | ICD-10-CM | POA: Diagnosis not present

## 2018-04-04 DIAGNOSIS — E119 Type 2 diabetes mellitus without complications: Secondary | ICD-10-CM | POA: Diagnosis not present

## 2018-04-04 DIAGNOSIS — E559 Vitamin D deficiency, unspecified: Secondary | ICD-10-CM | POA: Diagnosis not present

## 2018-04-21 DIAGNOSIS — M9903 Segmental and somatic dysfunction of lumbar region: Secondary | ICD-10-CM | POA: Diagnosis not present

## 2018-04-21 DIAGNOSIS — M545 Low back pain: Secondary | ICD-10-CM | POA: Diagnosis not present

## 2018-04-21 DIAGNOSIS — M9901 Segmental and somatic dysfunction of cervical region: Secondary | ICD-10-CM | POA: Diagnosis not present

## 2018-04-21 DIAGNOSIS — M542 Cervicalgia: Secondary | ICD-10-CM | POA: Diagnosis not present

## 2018-04-30 DIAGNOSIS — R69 Illness, unspecified: Secondary | ICD-10-CM | POA: Diagnosis not present

## 2018-05-12 DIAGNOSIS — M545 Low back pain: Secondary | ICD-10-CM | POA: Diagnosis not present

## 2018-05-12 DIAGNOSIS — M9903 Segmental and somatic dysfunction of lumbar region: Secondary | ICD-10-CM | POA: Diagnosis not present

## 2018-05-12 DIAGNOSIS — M542 Cervicalgia: Secondary | ICD-10-CM | POA: Diagnosis not present

## 2018-05-12 DIAGNOSIS — M9901 Segmental and somatic dysfunction of cervical region: Secondary | ICD-10-CM | POA: Diagnosis not present

## 2018-05-22 DIAGNOSIS — E1165 Type 2 diabetes mellitus with hyperglycemia: Secondary | ICD-10-CM | POA: Diagnosis not present

## 2018-05-22 DIAGNOSIS — R42 Dizziness and giddiness: Secondary | ICD-10-CM | POA: Diagnosis not present

## 2018-05-23 ENCOUNTER — Other Ambulatory Visit: Payer: Self-pay

## 2018-05-23 ENCOUNTER — Emergency Department (HOSPITAL_COMMUNITY): Payer: Medicare HMO

## 2018-05-23 ENCOUNTER — Encounter (HOSPITAL_COMMUNITY): Payer: Self-pay

## 2018-05-23 ENCOUNTER — Emergency Department (HOSPITAL_COMMUNITY)
Admission: EM | Admit: 2018-05-23 | Discharge: 2018-05-23 | Disposition: A | Discharge status: Home / Self Care | Payer: Medicare HMO | Attending: Emergency Medicine | Admitting: Emergency Medicine

## 2018-05-23 DIAGNOSIS — N3000 Acute cystitis without hematuria: Secondary | ICD-10-CM | POA: Diagnosis not present

## 2018-05-23 DIAGNOSIS — Z7984 Long term (current) use of oral hypoglycemic drugs: Secondary | ICD-10-CM | POA: Diagnosis not present

## 2018-05-23 DIAGNOSIS — I10 Essential (primary) hypertension: Secondary | ICD-10-CM | POA: Diagnosis not present

## 2018-05-23 DIAGNOSIS — R531 Weakness: Secondary | ICD-10-CM | POA: Diagnosis not present

## 2018-05-23 DIAGNOSIS — Z79899 Other long term (current) drug therapy: Secondary | ICD-10-CM | POA: Diagnosis not present

## 2018-05-23 DIAGNOSIS — J069 Acute upper respiratory infection, unspecified: Secondary | ICD-10-CM | POA: Diagnosis not present

## 2018-05-23 DIAGNOSIS — E119 Type 2 diabetes mellitus without complications: Secondary | ICD-10-CM | POA: Diagnosis not present

## 2018-05-23 DIAGNOSIS — R05 Cough: Secondary | ICD-10-CM | POA: Diagnosis not present

## 2018-05-23 HISTORY — DX: Essential (primary) hypertension: I10

## 2018-05-23 HISTORY — DX: Type 2 diabetes mellitus without complications: E11.9

## 2018-05-23 LAB — URINALYSIS, ROUTINE W REFLEX MICROSCOPIC
Bilirubin Urine: NEGATIVE
Glucose, UA: NEGATIVE mg/dL
Ketones, ur: NEGATIVE mg/dL
Nitrite: NEGATIVE
Protein, ur: 30 mg/dL — AB
Specific Gravity, Urine: 1.004 — ABNORMAL LOW (ref 1.005–1.030)
pH: 6 (ref 5.0–8.0)

## 2018-05-23 LAB — CBG MONITORING, ED: Glucose-Capillary: 70 mg/dL (ref 70–99)

## 2018-05-23 LAB — CBC
HCT: 37.7 % (ref 36.0–46.0)
Hemoglobin: 11.6 g/dL — ABNORMAL LOW (ref 12.0–15.0)
MCH: 28.4 pg (ref 26.0–34.0)
MCHC: 30.8 g/dL (ref 30.0–36.0)
MCV: 92.2 fL (ref 80.0–100.0)
Platelets: 177 10*3/uL (ref 150–400)
RBC: 4.09 MIL/uL (ref 3.87–5.11)
RDW: 13.2 % (ref 11.5–15.5)
WBC: 4.4 10*3/uL (ref 4.0–10.5)
nRBC: 0 % (ref 0.0–0.2)

## 2018-05-23 LAB — BASIC METABOLIC PANEL
Anion gap: 12 (ref 5–15)
BUN: 38 mg/dL — ABNORMAL HIGH (ref 8–23)
CO2: 25 mmol/L (ref 22–32)
Calcium: 9 mg/dL (ref 8.9–10.3)
Chloride: 97 mmol/L — ABNORMAL LOW (ref 98–111)
Creatinine, Ser: 1.88 mg/dL — ABNORMAL HIGH (ref 0.44–1.00)
GFR calc Af Amer: 30 mL/min — ABNORMAL LOW (ref >60)
GFR calc non Af Amer: 25 mL/min — ABNORMAL LOW (ref >60)
Glucose, Bld: 74 mg/dL (ref 70–99)
Potassium: 3.7 mmol/L (ref 3.5–5.1)
Sodium: 134 mmol/L — ABNORMAL LOW (ref 135–145)

## 2018-05-23 MED ORDER — SODIUM CHLORIDE 0.9 % IV BOLUS
1000.0000 mL | Freq: Once | INTRAVENOUS | Status: AC
  Administered 2018-05-23: 1000 mL via INTRAVENOUS

## 2018-05-23 MED ORDER — CEPHALEXIN 500 MG PO CAPS: 500.0000 mg | ORAL_CAPSULE | Freq: Four times a day (QID) | ORAL | 0 refills | Status: DC

## 2018-05-23 MED ORDER — SODIUM CHLORIDE 0.9% FLUSH: 3.0000 mL | Freq: Once | INTRAVENOUS | Status: DC

## 2018-05-23 NOTE — ED Provider Notes (Signed)
[facility] DEPT Provider Note   CSN: [id number] Arrival date & time: 05/23/18  1501     History   Chief Complaint Chief Complaint  Patient presents with  . Generalized Body Aches  . Weakness  . Cough  . Otalgia    HPI [name] is a 78 y.o. female.  Patient is a 78 year old female with history of hypertension, diabetes.  She presents today for a 2-week history of cough, congestion, intermittent low back pain, dysuria, and generally not feeling well.  Today she felt very weak when she was standing and walking.  She denies any chest pain or difficulty breathing.  She also complains of pain in her right ear.  The history is provided by the patient.  Weakness  Severity:  Moderate Onset quality:  Gradual Duration:  2 weeks Timing:  Constant Progression:  Worsening Chronicity:  New Relieved by:  Nothing Worsened by:  Nothing Associated symptoms: cough   Associated symptoms: no nausea and no shortness of breath   Cough  Associated symptoms: ear pain   Associated symptoms: no shortness of breath   Otalgia  Associated symptoms: cough     Past Medical History:  Diagnosis Date  . Diabetes mellitus without complication ([place])   . Hypertension     There are no active problems to display for this patient.   Past Surgical History:  Procedure Laterality Date  . TONSILLECTOMY       OB History   No obstetric history on file.      Home Medications    Prior to Admission medications   Medication Sig Start Date End Date Taking? Authorizing Provider  Cholecalciferol (VITAMIN D3) 25 MCG (1000 UT) CAPS  07/29/14  Yes [provider]  glipiZIDE (GLUCOTROL) 10 MG tablet  02/07/13  Yes [provider]  hydrALAZINE (APRESOLINE) 50 MG tablet  07/22/14  Yes [provider]  vitamin B-12 (CYANOCOBALAMIN) 1000 MCG tablet  07/29/14  Yes [provider]  glucose blood (ONE TOUCH ULTRA TEST) test strip TEST BLOOD  SUGARS 3 4 TIMES DAILY. 10/27/17   [provider]  silver sulfADIAZINE (SILVADENE) 1 % cream Apply 1 application topically daily. 03/18/15   Gean Birchwood, DPM    Family History History reviewed. No pertinent family history.  Social History Social History   Tobacco Use  . Smoking status: Never Smoker  . Smokeless tobacco: Never Used  Substance Use Topics  . Alcohol use: No  . Drug use: No     Allergies   Patient has no known allergies.   Review of Systems Review of Systems  HENT: Positive for ear pain.   Respiratory: Positive for cough. Negative for shortness of breath.   Gastrointestinal: Negative for nausea.  Neurological: Positive for weakness.     Physical Exam Updated Vital Signs BP 124/73 (BP Location: Left Arm)   Pulse 73   Temp 98.5 F (36.9 C) (Oral)   Resp 15   Ht 5\' 5"  (1.651 m)   Wt 72.6 kg   SpO2 99%   BMI 26.63 kg/m   Physical Exam   ED Treatments / Results  Labs (all labs ordered are listed, but only abnormal results are displayed) Labs Reviewed  BASIC METABOLIC PANEL - Abnormal; Notable for the following components:      Result Value   Sodium 134 (*)    Chloride 97 (*)    BUN 38 (*)    Creatinine, Ser 1.88 (*)    GFR calc  non Af Amer 25 (*)    GFR calc Af Amer 30 (*)    All other components within normal limits  CBC - Abnormal; Notable for the following components:   Hemoglobin 11.6 (*)    All other components within normal limits  URINALYSIS, ROUTINE W REFLEX MICROSCOPIC  CBG MONITORING, ED    EKG EKG Interpretation  Date/Time:  Tuesday May 23 2018 15:38:03 EST Ventricular Rate:  73 PR Interval:    QRS Duration: 107 QT Interval:  405 QTC Calculation: 447 R Axis:   121 Text Interpretation:  Sinus rhythm Atrial premature complex Low voltage, precordial leads Probable right ventricular hypertrophy Baseline wander in lead(s) I III aVR aVL Confirmed by Veryl Speak [phone]) on 05/23/2018 6:50:28  PM   Radiology No results found.  Procedures Procedures (including critical care time)  Medications Ordered in ED Medications  sodium chloride flush (NS) 0.9 % injection 3 mL (has no administration in time range)  sodium chloride 0.9 % bolus 1,000 mL (has no administration in time range)     Initial Impression / Assessment and Plan / ED Course  I have reviewed the triage vital signs and the nursing notes.  Pertinent labs & imaging results that were available during my care of the patient were reviewed by me and considered in my medical decision making (see chart for details).  Patient with prolonged URI and evidence for a UTI.  This will be treated with antibiotics and follow-up as needed.  She appears otherwise well.  Vitals are stable and there is no hypoxia.  Final Clinical Impressions(s) / ED Diagnoses   Final diagnoses:  None       Veryl Speak, MD 05/23/18 2239

## 2018-05-23 NOTE — Discharge Instructions (Addendum)
Keflex as prescribed.  Ibuprofen 600 mg every 6 hours as needed for pain or fever.  Return to the emergency department if you develop worsening pain, difficulty breathing, or other new and concerning symptoms.

## 2018-05-23 NOTE — ED Triage Notes (Addendum)
Patient c/o weakness and low back pain x 2 days. Patient has a productive cough with a small amount of yellow sputum x 1 week.  Patient added at the end of triage that she has right ear pain and states she had a diagnosed right ear infection 4 months ago.

## 2018-05-23 NOTE — ED Notes (Signed)
Pt is aware she needs to give a urine sample

## 2018-06-02 DIAGNOSIS — M9903 Segmental and somatic dysfunction of lumbar region: Secondary | ICD-10-CM | POA: Diagnosis not present

## 2018-06-02 DIAGNOSIS — M545 Low back pain: Secondary | ICD-10-CM | POA: Diagnosis not present

## 2018-06-02 DIAGNOSIS — M9901 Segmental and somatic dysfunction of cervical region: Secondary | ICD-10-CM | POA: Diagnosis not present

## 2018-06-02 DIAGNOSIS — M542 Cervicalgia: Secondary | ICD-10-CM | POA: Diagnosis not present

## 2018-06-14 ENCOUNTER — Ambulatory Visit: Payer: Medicare HMO | Admitting: Podiatry

## 2018-06-16 DIAGNOSIS — M545 Low back pain: Secondary | ICD-10-CM | POA: Diagnosis not present

## 2018-06-16 DIAGNOSIS — M9901 Segmental and somatic dysfunction of cervical region: Secondary | ICD-10-CM | POA: Diagnosis not present

## 2018-06-16 DIAGNOSIS — M542 Cervicalgia: Secondary | ICD-10-CM | POA: Diagnosis not present

## 2018-06-16 DIAGNOSIS — M9903 Segmental and somatic dysfunction of lumbar region: Secondary | ICD-10-CM | POA: Diagnosis not present

## 2018-07-14 DIAGNOSIS — M9903 Segmental and somatic dysfunction of lumbar region: Secondary | ICD-10-CM | POA: Diagnosis not present

## 2018-07-14 DIAGNOSIS — M9901 Segmental and somatic dysfunction of cervical region: Secondary | ICD-10-CM | POA: Diagnosis not present

## 2018-07-14 DIAGNOSIS — M545 Low back pain: Secondary | ICD-10-CM | POA: Diagnosis not present

## 2018-07-14 DIAGNOSIS — M542 Cervicalgia: Secondary | ICD-10-CM | POA: Diagnosis not present

## 2018-08-01 DIAGNOSIS — E559 Vitamin D deficiency, unspecified: Secondary | ICD-10-CM | POA: Diagnosis not present

## 2018-08-01 DIAGNOSIS — I119 Hypertensive heart disease without heart failure: Secondary | ICD-10-CM | POA: Diagnosis not present

## 2018-08-01 DIAGNOSIS — E1142 Type 2 diabetes mellitus with diabetic polyneuropathy: Secondary | ICD-10-CM | POA: Diagnosis not present

## 2018-08-01 DIAGNOSIS — E7800 Pure hypercholesterolemia, unspecified: Secondary | ICD-10-CM | POA: Diagnosis not present

## 2018-08-01 DIAGNOSIS — N183 Chronic kidney disease, stage 3 (moderate): Secondary | ICD-10-CM | POA: Diagnosis not present

## 2018-08-01 DIAGNOSIS — E1165 Type 2 diabetes mellitus with hyperglycemia: Secondary | ICD-10-CM | POA: Diagnosis not present

## 2018-08-01 DIAGNOSIS — I10 Essential (primary) hypertension: Secondary | ICD-10-CM | POA: Diagnosis not present

## 2018-08-03 ENCOUNTER — Other Ambulatory Visit: Payer: Self-pay | Admitting: Internal Medicine

## 2018-08-03 DIAGNOSIS — Z1231 Encounter for screening mammogram for malignant neoplasm of breast: Secondary | ICD-10-CM

## 2018-08-07 DIAGNOSIS — N3090 Cystitis, unspecified without hematuria: Secondary | ICD-10-CM | POA: Diagnosis not present

## 2018-08-07 DIAGNOSIS — I10 Essential (primary) hypertension: Secondary | ICD-10-CM | POA: Diagnosis not present

## 2018-08-07 DIAGNOSIS — N183 Chronic kidney disease, stage 3 (moderate): Secondary | ICD-10-CM | POA: Diagnosis not present

## 2018-08-07 DIAGNOSIS — E1165 Type 2 diabetes mellitus with hyperglycemia: Secondary | ICD-10-CM | POA: Diagnosis not present

## 2018-08-07 DIAGNOSIS — E1142 Type 2 diabetes mellitus with diabetic polyneuropathy: Secondary | ICD-10-CM | POA: Diagnosis not present

## 2018-08-07 DIAGNOSIS — E559 Vitamin D deficiency, unspecified: Secondary | ICD-10-CM | POA: Diagnosis not present

## 2018-08-07 DIAGNOSIS — I119 Hypertensive heart disease without heart failure: Secondary | ICD-10-CM | POA: Diagnosis not present

## 2018-08-07 DIAGNOSIS — E7800 Pure hypercholesterolemia, unspecified: Secondary | ICD-10-CM | POA: Diagnosis not present

## 2018-10-06 ENCOUNTER — Ambulatory Visit: Payer: Medicare HMO

## 2018-11-20 DIAGNOSIS — I10 Essential (primary) hypertension: Secondary | ICD-10-CM | POA: Diagnosis not present

## 2018-11-20 DIAGNOSIS — E1142 Type 2 diabetes mellitus with diabetic polyneuropathy: Secondary | ICD-10-CM | POA: Diagnosis not present

## 2018-11-20 DIAGNOSIS — I119 Hypertensive heart disease without heart failure: Secondary | ICD-10-CM | POA: Diagnosis not present

## 2018-11-20 DIAGNOSIS — N183 Chronic kidney disease, stage 3 (moderate): Secondary | ICD-10-CM | POA: Diagnosis not present

## 2018-11-20 DIAGNOSIS — E1165 Type 2 diabetes mellitus with hyperglycemia: Secondary | ICD-10-CM | POA: Diagnosis not present

## 2018-11-20 DIAGNOSIS — E7800 Pure hypercholesterolemia, unspecified: Secondary | ICD-10-CM | POA: Diagnosis not present

## 2018-11-20 DIAGNOSIS — E559 Vitamin D deficiency, unspecified: Secondary | ICD-10-CM | POA: Diagnosis not present

## 2018-11-27 DIAGNOSIS — M9903 Segmental and somatic dysfunction of lumbar region: Secondary | ICD-10-CM | POA: Diagnosis not present

## 2018-11-27 DIAGNOSIS — M545 Low back pain: Secondary | ICD-10-CM | POA: Diagnosis not present

## 2018-11-27 DIAGNOSIS — M542 Cervicalgia: Secondary | ICD-10-CM | POA: Diagnosis not present

## 2018-11-27 DIAGNOSIS — M9901 Segmental and somatic dysfunction of cervical region: Secondary | ICD-10-CM | POA: Diagnosis not present

## 2018-12-01 DIAGNOSIS — E1165 Type 2 diabetes mellitus with hyperglycemia: Secondary | ICD-10-CM | POA: Diagnosis not present

## 2018-12-01 DIAGNOSIS — E7800 Pure hypercholesterolemia, unspecified: Secondary | ICD-10-CM | POA: Diagnosis not present

## 2018-12-01 DIAGNOSIS — N183 Chronic kidney disease, stage 3 (moderate): Secondary | ICD-10-CM | POA: Diagnosis not present

## 2018-12-01 DIAGNOSIS — I119 Hypertensive heart disease without heart failure: Secondary | ICD-10-CM | POA: Diagnosis not present

## 2018-12-01 DIAGNOSIS — Z0100 Encounter for examination of eyes and vision without abnormal findings: Secondary | ICD-10-CM | POA: Diagnosis not present

## 2018-12-01 DIAGNOSIS — Z0000 Encounter for general adult medical examination without abnormal findings: Secondary | ICD-10-CM | POA: Diagnosis not present

## 2018-12-01 DIAGNOSIS — I10 Essential (primary) hypertension: Secondary | ICD-10-CM | POA: Diagnosis not present

## 2018-12-01 DIAGNOSIS — E559 Vitamin D deficiency, unspecified: Secondary | ICD-10-CM | POA: Diagnosis not present

## 2018-12-01 DIAGNOSIS — E1142 Type 2 diabetes mellitus with diabetic polyneuropathy: Secondary | ICD-10-CM | POA: Diagnosis not present

## 2018-12-11 DIAGNOSIS — R69 Illness, unspecified: Secondary | ICD-10-CM | POA: Diagnosis not present

## 2018-12-12 DIAGNOSIS — R69 Illness, unspecified: Secondary | ICD-10-CM | POA: Diagnosis not present

## 2019-01-03 DIAGNOSIS — N183 Chronic kidney disease, stage 3 (moderate): Secondary | ICD-10-CM | POA: Diagnosis not present

## 2019-01-03 DIAGNOSIS — Z1389 Encounter for screening for other disorder: Secondary | ICD-10-CM | POA: Diagnosis not present

## 2019-01-03 DIAGNOSIS — Z0001 Encounter for general adult medical examination with abnormal findings: Secondary | ICD-10-CM | POA: Diagnosis not present

## 2019-01-03 DIAGNOSIS — E1142 Type 2 diabetes mellitus with diabetic polyneuropathy: Secondary | ICD-10-CM | POA: Diagnosis not present

## 2019-01-03 DIAGNOSIS — E7800 Pure hypercholesterolemia, unspecified: Secondary | ICD-10-CM | POA: Diagnosis not present

## 2019-01-03 DIAGNOSIS — I119 Hypertensive heart disease without heart failure: Secondary | ICD-10-CM | POA: Diagnosis not present

## 2019-01-03 DIAGNOSIS — E559 Vitamin D deficiency, unspecified: Secondary | ICD-10-CM | POA: Diagnosis not present

## 2019-01-03 DIAGNOSIS — I10 Essential (primary) hypertension: Secondary | ICD-10-CM | POA: Diagnosis not present

## 2019-01-03 DIAGNOSIS — E1165 Type 2 diabetes mellitus with hyperglycemia: Secondary | ICD-10-CM | POA: Diagnosis not present

## 2019-01-09 DIAGNOSIS — R69 Illness, unspecified: Secondary | ICD-10-CM | POA: Diagnosis not present

## 2019-01-16 DIAGNOSIS — R69 Illness, unspecified: Secondary | ICD-10-CM | POA: Diagnosis not present

## 2019-01-19 ENCOUNTER — Ambulatory Visit: Payer: Medicare HMO | Admitting: Podiatry

## 2019-01-19 ENCOUNTER — Encounter: Payer: Self-pay | Admitting: Podiatry

## 2019-01-19 ENCOUNTER — Other Ambulatory Visit: Payer: Self-pay

## 2019-01-19 DIAGNOSIS — M9901 Segmental and somatic dysfunction of cervical region: Secondary | ICD-10-CM | POA: Diagnosis not present

## 2019-01-19 DIAGNOSIS — B351 Tinea unguium: Secondary | ICD-10-CM | POA: Diagnosis not present

## 2019-01-19 DIAGNOSIS — M545 Low back pain: Secondary | ICD-10-CM | POA: Diagnosis not present

## 2019-01-19 DIAGNOSIS — M79675 Pain in left toe(s): Secondary | ICD-10-CM | POA: Diagnosis not present

## 2019-01-19 DIAGNOSIS — M79674 Pain in right toe(s): Secondary | ICD-10-CM

## 2019-01-19 DIAGNOSIS — E1142 Type 2 diabetes mellitus with diabetic polyneuropathy: Secondary | ICD-10-CM

## 2019-01-19 DIAGNOSIS — M542 Cervicalgia: Secondary | ICD-10-CM | POA: Diagnosis not present

## 2019-01-19 DIAGNOSIS — M9903 Segmental and somatic dysfunction of lumbar region: Secondary | ICD-10-CM | POA: Diagnosis not present

## 2019-01-19 NOTE — Progress Notes (Signed)
Complaint:  Visit Type: Patient returns to my office for continued preventative foot care services. Complaint: Patient states" my nails have grown long and thick and become painful to walk and wear shoes" Patient has been diagnosed with DM with neuropathy.. The patient presents for preventative foot care services. No changes to ROS  Podiatric Exam: Vascular: dorsalis pedis and posterior tibial pulses are palpable bilateral. Capillary return is immediate. Temperature gradient is WNL. Skin turgor WNL  Sensorium: Diminished  Semmes Weinstein monofilament test. Diminished  tactile sensation bilaterally. Nail Exam: Pt has thick disfigured discolored nails with subungual debris noted bilateral entire nail hallux through fifth toenails Ulcer Exam: There is no evidence of ulcer or pre-ulcerative changes or infection. Orthopedic Exam: Muscle tone and strength are WNL. No limitations in general ROM. No crepitus or effusions noted. Foot type and digits show no abnormalities. Hallux limitus right foot. Skin: No Porokeratosis. No infection or ulcers  Diagnosis:  Onychomycosis, , Pain in right toe, pain in left toes Diabetic neuropathy  Treatment & Plan Procedures and Treatment: Consent by patient was obtained for treatment procedures.   Debridement of mycotic and hypertrophic toenails, 1 through 5 bilateral and clearing of subungual debris. No ulceration, no infection noted. 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 Return Visit-Office Procedure: Patient instructed to return to the office for a follow up visit 3 months for continued evaluation and treatment.    Paw Karstens DPM 

## 2019-02-16 DIAGNOSIS — M9901 Segmental and somatic dysfunction of cervical region: Secondary | ICD-10-CM | POA: Diagnosis not present

## 2019-02-16 DIAGNOSIS — M545 Low back pain: Secondary | ICD-10-CM | POA: Diagnosis not present

## 2019-02-16 DIAGNOSIS — M9903 Segmental and somatic dysfunction of lumbar region: Secondary | ICD-10-CM | POA: Diagnosis not present

## 2019-02-16 DIAGNOSIS — M542 Cervicalgia: Secondary | ICD-10-CM | POA: Diagnosis not present

## 2019-03-05 DIAGNOSIS — M542 Cervicalgia: Secondary | ICD-10-CM | POA: Diagnosis not present

## 2019-03-05 DIAGNOSIS — M9901 Segmental and somatic dysfunction of cervical region: Secondary | ICD-10-CM | POA: Diagnosis not present

## 2019-03-05 DIAGNOSIS — M9903 Segmental and somatic dysfunction of lumbar region: Secondary | ICD-10-CM | POA: Diagnosis not present

## 2019-03-05 DIAGNOSIS — M545 Low back pain: Secondary | ICD-10-CM | POA: Diagnosis not present

## 2019-04-06 DIAGNOSIS — M9903 Segmental and somatic dysfunction of lumbar region: Secondary | ICD-10-CM | POA: Diagnosis not present

## 2019-04-06 DIAGNOSIS — M545 Low back pain: Secondary | ICD-10-CM | POA: Diagnosis not present

## 2019-04-06 DIAGNOSIS — M542 Cervicalgia: Secondary | ICD-10-CM | POA: Diagnosis not present

## 2019-04-06 DIAGNOSIS — M9901 Segmental and somatic dysfunction of cervical region: Secondary | ICD-10-CM | POA: Diagnosis not present

## 2019-04-16 DIAGNOSIS — I119 Hypertensive heart disease without heart failure: Secondary | ICD-10-CM | POA: Diagnosis not present

## 2019-04-16 DIAGNOSIS — I872 Venous insufficiency (chronic) (peripheral): Secondary | ICD-10-CM | POA: Diagnosis not present

## 2019-04-16 DIAGNOSIS — E1165 Type 2 diabetes mellitus with hyperglycemia: Secondary | ICD-10-CM | POA: Diagnosis not present

## 2019-04-16 DIAGNOSIS — E7800 Pure hypercholesterolemia, unspecified: Secondary | ICD-10-CM | POA: Diagnosis not present

## 2019-04-16 DIAGNOSIS — N1832 Chronic kidney disease, stage 3b: Secondary | ICD-10-CM | POA: Diagnosis not present

## 2019-04-16 DIAGNOSIS — E559 Vitamin D deficiency, unspecified: Secondary | ICD-10-CM | POA: Diagnosis not present

## 2019-04-16 DIAGNOSIS — Z0001 Encounter for general adult medical examination with abnormal findings: Secondary | ICD-10-CM | POA: Diagnosis not present

## 2019-04-16 DIAGNOSIS — E1142 Type 2 diabetes mellitus with diabetic polyneuropathy: Secondary | ICD-10-CM | POA: Diagnosis not present

## 2019-04-16 DIAGNOSIS — I10 Essential (primary) hypertension: Secondary | ICD-10-CM | POA: Diagnosis not present

## 2019-04-19 DIAGNOSIS — R69 Illness, unspecified: Secondary | ICD-10-CM | POA: Diagnosis not present

## 2019-04-20 ENCOUNTER — Ambulatory Visit: Payer: Medicare HMO | Admitting: Podiatry

## 2019-04-25 ENCOUNTER — Other Ambulatory Visit: Payer: Self-pay

## 2019-04-25 ENCOUNTER — Emergency Department (HOSPITAL_BASED_OUTPATIENT_CLINIC_OR_DEPARTMENT_OTHER)
Admission: EM | Admit: 2019-04-25 | Discharge: 2019-04-25 | Disposition: A | Discharge status: Home / Self Care | Payer: Medicare HMO | Attending: Emergency Medicine | Admitting: Emergency Medicine

## 2019-04-25 ENCOUNTER — Emergency Department (HOSPITAL_BASED_OUTPATIENT_CLINIC_OR_DEPARTMENT_OTHER): Payer: Medicare HMO

## 2019-04-25 ENCOUNTER — Encounter (HOSPITAL_BASED_OUTPATIENT_CLINIC_OR_DEPARTMENT_OTHER): Payer: Self-pay

## 2019-04-25 DIAGNOSIS — N1832 Chronic kidney disease, stage 3b: Secondary | ICD-10-CM | POA: Diagnosis not present

## 2019-04-25 DIAGNOSIS — I119 Hypertensive heart disease without heart failure: Secondary | ICD-10-CM | POA: Diagnosis not present

## 2019-04-25 DIAGNOSIS — I509 Heart failure, unspecified: Secondary | ICD-10-CM | POA: Diagnosis not present

## 2019-04-25 DIAGNOSIS — D649 Anemia, unspecified: Secondary | ICD-10-CM

## 2019-04-25 DIAGNOSIS — M7989 Other specified soft tissue disorders: Secondary | ICD-10-CM

## 2019-04-25 DIAGNOSIS — J90 Pleural effusion, not elsewhere classified: Secondary | ICD-10-CM | POA: Diagnosis not present

## 2019-04-25 DIAGNOSIS — R05 Cough: Secondary | ICD-10-CM

## 2019-04-25 DIAGNOSIS — E119 Type 2 diabetes mellitus without complications: Secondary | ICD-10-CM | POA: Diagnosis not present

## 2019-04-25 DIAGNOSIS — Z7984 Long term (current) use of oral hypoglycemic drugs: Secondary | ICD-10-CM | POA: Diagnosis not present

## 2019-04-25 DIAGNOSIS — I110 Hypertensive heart disease with heart failure: Secondary | ICD-10-CM | POA: Diagnosis not present

## 2019-04-25 DIAGNOSIS — Z79899 Other long term (current) drug therapy: Secondary | ICD-10-CM | POA: Insufficient documentation

## 2019-04-25 DIAGNOSIS — Z20828 Contact with and (suspected) exposure to other viral communicable diseases: Secondary | ICD-10-CM | POA: Insufficient documentation

## 2019-04-25 DIAGNOSIS — E559 Vitamin D deficiency, unspecified: Secondary | ICD-10-CM | POA: Diagnosis not present

## 2019-04-25 DIAGNOSIS — R059 Cough, unspecified: Secondary | ICD-10-CM

## 2019-04-25 DIAGNOSIS — I872 Venous insufficiency (chronic) (peripheral): Secondary | ICD-10-CM | POA: Diagnosis not present

## 2019-04-25 DIAGNOSIS — I10 Essential (primary) hypertension: Secondary | ICD-10-CM | POA: Diagnosis not present

## 2019-04-25 DIAGNOSIS — E1142 Type 2 diabetes mellitus with diabetic polyneuropathy: Secondary | ICD-10-CM | POA: Diagnosis not present

## 2019-04-25 DIAGNOSIS — E1165 Type 2 diabetes mellitus with hyperglycemia: Secondary | ICD-10-CM | POA: Diagnosis not present

## 2019-04-25 DIAGNOSIS — R0602 Shortness of breath: Secondary | ICD-10-CM | POA: Diagnosis not present

## 2019-04-25 DIAGNOSIS — E7800 Pure hypercholesterolemia, unspecified: Secondary | ICD-10-CM | POA: Diagnosis not present

## 2019-04-25 LAB — BASIC METABOLIC PANEL
Anion gap: 8 (ref 5–15)
BUN: 28 mg/dL — ABNORMAL HIGH (ref 8–23)
CO2: 22 mmol/L (ref 22–32)
Calcium: 8.8 mg/dL — ABNORMAL LOW (ref 8.9–10.3)
Chloride: 105 mmol/L (ref 98–111)
Creatinine, Ser: 1.36 mg/dL — ABNORMAL HIGH (ref 0.44–1.00)
GFR calc Af Amer: 43 mL/min — ABNORMAL LOW (ref >60)
GFR calc non Af Amer: 37 mL/min — ABNORMAL LOW (ref >60)
Glucose, Bld: 185 mg/dL — ABNORMAL HIGH (ref 70–99)
Potassium: 4.5 mmol/L (ref 3.5–5.1)
Sodium: 135 mmol/L (ref 135–145)

## 2019-04-25 LAB — CBC
HCT: 27.9 % — ABNORMAL LOW (ref 36.0–46.0)
Hemoglobin: 8.7 g/dL — ABNORMAL LOW (ref 12.0–15.0)
MCH: 29.1 pg (ref 26.0–34.0)
MCHC: 31.2 g/dL (ref 30.0–36.0)
MCV: 93.3 fL (ref 80.0–100.0)
Platelets: 209 10*3/uL (ref 150–400)
RBC: 2.99 MIL/uL — ABNORMAL LOW (ref 3.87–5.11)
RDW: 15.1 % (ref 11.5–15.5)
WBC: 4.8 10*3/uL (ref 4.0–10.5)
nRBC: 0 % (ref 0.0–0.2)

## 2019-04-25 LAB — SARS CORONAVIRUS 2 AG (30 MIN TAT): SARS Coronavirus 2 Ag: NEGATIVE

## 2019-04-25 LAB — BRAIN NATRIURETIC PEPTIDE: B Natriuretic Peptide: 628.4 pg/mL — ABNORMAL HIGH (ref 0.0–100.0)

## 2019-04-25 LAB — TROPONIN I (HIGH SENSITIVITY): Troponin I (High Sensitivity): 14 ng/L (ref <18)

## 2019-04-25 MED ORDER — FUROSEMIDE 20 MG PO TABS: 20.0000 mg | ORAL_TABLET | Freq: Every day | ORAL | 0 refills | Status: DC

## 2019-04-25 MED FILL — FUROSEMIDE 20 MG TAB: 20 | 90 days supply | Qty: 90 | Fill #0

## 2019-04-25 NOTE — ED Provider Notes (Signed)
[facility] EMERGENCY DEPARTMENT Provider Note   CSN: [id number] Arrival date & time: 04/25/19  1324     History Chief Complaint  Patient presents with  . Leg Swelling    [name] is a 78 y.o. female with a history of hypertension, diabetes, presented to the ED with leg swelling shortness of breath.  Patient reports she has had intermittent episodes of bilateral leg swelling for several weeks to months.  She says currently her legs are swollen but not as bad as it used to be.  She also describes feeling dyspnea on exertion, although it is unclear when this began.  Difficult to obtain an exact timeline of events.  Patient does report to me that she feels more short of breath than usual with basic activities.  She denies orthopnea.  She denies any known history of heart failure.  She is not on any diuretics.  She denies any known history of blood clots in the legs or lungs.  She denies any chest pain or syncope.  HPI     Past Medical History:  Diagnosis Date  . Diabetes mellitus without complication ([place])   . Hypertension     There are no problems to display for this patient.   Past Surgical History:  Procedure Laterality Date  . TONSILLECTOMY       OB History   No obstetric history on file.     No family history on file.  Social History   Tobacco Use  . Smoking status: Never Smoker  . Smokeless tobacco: Never Used  Substance Use Topics  . Alcohol use: No  . Drug use: No    Home Medications Prior to Admission medications   Medication Sig Start Date End Date Taking? Authorizing Provider  Accu-Chek FastClix Lancets MISC See admin instructions. 01/16/19   [provider]  amLODipine (NORVASC) 10 MG tablet TK 1 T PO QD 12/12/18   [provider]  Blood Glucose Monitoring Suppl (ACCU-CHEK GUIDE ME) w/Device KIT See admin instructions. 12/11/18   [provider]  cephALEXin (KEFLEX) 500 MG capsule Take 1 capsule (500 mg total)  by mouth 4 (four) times daily. 05/23/18   Veryl Speak, MD  Cholecalciferol (VITAMIN D3) 25 MCG (1000 UT) CAPS  07/29/14   [provider]  ciprofloxacin (CIPRO) 250 MG tablet TK 1 T PO Q 12 H FOR 5 DAYS 08/07/18   [provider]  furosemide (LASIX) 20 MG tablet Take 1 tablet (20 mg total) by mouth daily for 90 doses. 04/25/19 07/24/19  Wyvonnia Dusky, MD  gabapentin (NEURONTIN) 100 MG capsule TK 1 C PO BID 12/12/18   [provider]  glipiZIDE (GLUCOTROL) 10 MG tablet  02/07/13   [provider]  glucose blood (ONE TOUCH ULTRA TEST) test strip TEST BLOOD SUGARS 3 4 TIMES DAILY. 10/27/17   [provider]  hydrALAZINE (APRESOLINE) 50 MG tablet  07/22/14   [provider]  pravastatin (PRAVACHOL) 20 MG tablet TAKE 1 TABLET BY MOUTH QD AT BEDTIME 12/02/18   [provider]  silver sulfADIAZINE (SILVADENE) 1 % cream Apply 1 application topically daily. 03/18/15   Tuchman, Richard C, DPM  TRADJENTA 5 MG TABS tablet TK 1 T PO QD 12/02/18   [provider]  vitamin B-12 (CYANOCOBALAMIN) 1000 MCG tablet  07/29/14   [provider]    Allergies    Patient has no known allergies.  Review of Systems   Review of Systems  Constitutional: Positive for  fatigue. Negative for chills and fever.  Eyes: Negative for photophobia, pain and visual disturbance.  Respiratory: Positive for shortness of breath. Negative for cough.   Cardiovascular: Positive for leg swelling. Negative for chest pain and palpitations.  Gastrointestinal: Negative for abdominal pain, nausea and vomiting.  Genitourinary: Negative for dysuria and hematuria.  Musculoskeletal: Negative for arthralgias and back pain.  Skin: Negative for color change and rash.  Neurological: Negative for syncope and light-headedness.  All other systems reviewed and are negative.   Physical Exam Updated Vital Signs BP (!) 164/77 (BP Location: Left Arm)   Pulse 82   Temp 97.9 F  (36.6 C) (Oral)   Resp 16   Ht 5' 4.5" (1.638 m)   Wt 85.9 kg   SpO2 100%   BMI 32.01 kg/m   Physical Exam Vitals and nursing note reviewed.  Constitutional:      General: She is not in acute distress.    Appearance: She is well-developed.  HENT:     Head: Normocephalic and atraumatic.  Eyes:     Conjunctiva/sclera: Conjunctivae normal.  Cardiovascular:     Rate and Rhythm: Normal rate and regular rhythm.     Pulses: Normal pulses.  Pulmonary:     Effort: Pulmonary effort is normal. No respiratory distress.     Comments: Crackles in lung bases Sating 100% on room air Breathing and speaking comfortably Abdominal:     Palpations: Abdomen is soft.     Tenderness: There is no abdominal tenderness.  Musculoskeletal:        General: No deformity.     Cervical back: Neck supple.     Right lower leg: Edema present.     Left lower leg: Edema present.  Skin:    General: Skin is warm and dry.  Neurological:     General: No focal deficit present.     Mental Status: She is alert and oriented to person, place, and time.     ED Results / Procedures / Treatments   Labs (all labs ordered are listed, but only abnormal results are displayed) Labs Reviewed  BASIC METABOLIC PANEL - Abnormal; Notable for the following components:      Result Value   Glucose, Bld 185 (*)    BUN 28 (*)    Creatinine, Ser 1.36 (*)    Calcium 8.8 (*)    GFR calc non Af Amer 37 (*)    GFR calc Af Amer 43 (*)    All other components within normal limits  CBC - Abnormal; Notable for the following components:   RBC 2.99 (*)    Hemoglobin 8.7 (*)    HCT 27.9 (*)    All other components within normal limits  BRAIN NATRIURETIC PEPTIDE - Abnormal; Notable for the following components:   B Natriuretic Peptide 628.4 (*)    All other components within normal limits  SARS CORONAVIRUS 2 AG (30 MIN TAT)  TROPONIN I (HIGH SENSITIVITY)    EKG EKG Interpretation  Date/Time:  Wednesday April 25 2019  14:00:47 EST Ventricular Rate:  82 PR Interval:    QRS Duration: 116 QT Interval:  414 QTC Calculation: 484 R Axis:   86 Text Interpretation: Sinus rhythm IRBBB and LPFB Low voltage, precordial leads Consider anterior infarct No STEMI Confirmed by Octaviano Glow [phone]) on 04/25/2019 2:16:53 PM   Radiology DG Chest Port 1 View  Result Date: 04/25/2019 CLINICAL DATA:  Bilateral lower extremity swelling. EXAM: PORTABLE CHEST 1 VIEW COMPARISON:  Chest x-ray 05/23/2018.  FINDINGS: Mediastinum hilar structures normal. Cardiomegaly. Mild pulmonary venous congestion bilateral interstitial prominence. Findings suggest mild CHF. Calcified pulmonary nodules noted consistent with old calcified granulomas. Small right pleural effusion cannot be excluded. IMPRESSION: Cardiomegaly with mild pulmonary venous congestion and bilateral interstitial prominence. Findings suggest mild CHF. Small right pleural effusion noted. Electronically Signed   By: Marcello Moores  Register   On: 04/25/2019 14:56    Procedures Procedures (including critical care time)  Medications Ordered in ED Medications - No data to display  ED Course  I have reviewed the triage vital signs and the nursing notes.  Pertinent labs & imaging results that were available during my care of the patient were reviewed by me and considered in my medical decision making (see chart for details).  78 yo female presenting to ED with leg swelling for several months, and intermittent episodes of dyspnea on exertion.  She wanted to see her PCP but reports she was told she couldn't be seen in the office due to high-risk covid exposure (her granddaughter is positive and lives with her).  Plan to check for covid BNP and trop - possible new onset heart failure.  She has never had an echocardiogram.  She is stable from a respiratory standpoint Low suspicion for ACS or PE  LE edema may alternatively be related to venous congestion given its intermittent  nature  Clinical Course as of Apr 24 1914  Wed Apr 25, 2019  1531 BNP elevation consistent with congestive heart failure.  I contacted heartcare group, they can see her on Jan 5th at 11 am with Dr Gwenlyn Found for new patient appointment, they'll do an office assessment and determine need for echocardiogram.  In the meantime I'll start her on lasix.  Given that this has been ongoing for months, and that she is stable on room air, I believe it is reasonable to discharge her home at this time.   [MT]    Clinical Course User Index [MT] Wyvonnia Dusky, MD    Final Clinical Impression(s) / ED Diagnoses Final diagnoses:  Leg swelling  Heart failure, unspecified HF chronicity, unspecified heart failure type ([place])  Anemia, unspecified type    Rx / DC Orders ED Discharge Orders         Ordered    furosemide (LASIX) 20 MG tablet  Daily     04/25/19 1535           Wyvonnia Dusky, MD 04/25/19 [phone]

## 2019-04-25 NOTE — ED Triage Notes (Signed)
Pt c/o swelling/pain to bilat LE-states she was to see MD today but refused follow up due to pt has cough and a +covid family member-pt NAD-to triage in w/c

## 2019-04-25 NOTE — ED Notes (Signed)
JVD present.

## 2019-04-25 NOTE — Discharge Instructions (Addendum)
Your workup in the ER today was concerning for new onset of congestive heart failure.  I started you on a water pill, called Lasix, to help you pee some of the fluid out of your legs and lungs.  You can start by taking 20 mg of Lasix once a day in the morning.  If you begin to feel more short of breath at home, you can double your dose to 40 mg of Lasix in the morning (or else take 20 mg in the morning and 20 mg at night).  If this is still not helping, come back to the ER.  It is VERY important that you are seen by a heart doctor as soon as possible.  We've scheduled you an appointment with Dr Gwenlyn Found on January 5th 2021 at 11 AM.  I've circled their address on your papers.  Please do not miss this appointment.  You need close attention from a heart doctor to make sure this condition does not get worse.  Separately, your hemoglobin level was lower than usual today.  It was 8.3.  This is a sign of anemia.  You should have your primary care doctor recheck this level, and your kidney function, in 1-2 weeks if possible.  If you feel like your breathing is getting worse, or you are lightheaded, or having chest pain, or any other concerning symptoms, please return to the ER immediately.

## 2019-05-08 ENCOUNTER — Encounter: Payer: Self-pay | Admitting: Cardiovascular Disease

## 2019-05-08 ENCOUNTER — Other Ambulatory Visit: Payer: Self-pay

## 2019-05-08 ENCOUNTER — Ambulatory Visit (INDEPENDENT_AMBULATORY_CARE_PROVIDER_SITE_OTHER): Payer: Medicare HMO | Admitting: Cardiovascular Disease

## 2019-05-08 DIAGNOSIS — E782 Mixed hyperlipidemia: Secondary | ICD-10-CM

## 2019-05-08 DIAGNOSIS — R600 Localized edema: Secondary | ICD-10-CM | POA: Diagnosis not present

## 2019-05-08 DIAGNOSIS — E785 Hyperlipidemia, unspecified: Secondary | ICD-10-CM | POA: Insufficient documentation

## 2019-05-08 DIAGNOSIS — I10 Essential (primary) hypertension: Secondary | ICD-10-CM | POA: Diagnosis not present

## 2019-05-08 MED ORDER — FUROSEMIDE 20 MG PO TABS: 20.0000 mg | ORAL_TABLET | Freq: Two times a day (BID) | ORAL | 0 refills | Status: DC

## 2019-05-08 NOTE — Assessment & Plan Note (Signed)
Patient was seen in the emergency room 04/25/2019 with bilateral lower extreme edema.  She was treated with furosemide.  She has lost a significant amount of weight.  Her edema has markedly improved but not completely resolved.  She does complain of some dyspnea.  Going to increase her furosemide from 20 mg once a day to twice daily.  We will check a basic metabolic panel in 2 weeks, obtain a 2D echocardiogram.  She will see an APP back in 6 weeks and me back in 3 months.

## 2019-05-08 NOTE — Assessment & Plan Note (Signed)
History of hyperlipidemia on pravastatin with lipid profile performed 12/01/2018 revealing total cholesterol 219, LDL 119 HDL 70.

## 2019-05-08 NOTE — Patient Instructions (Addendum)
Medication Instructions:  Start taking 20mg  Lasix Twice Daily  If you need a refill on your cardiac medications before your next appointment, please call your pharmacy.   Lab work: BMET in 2 weeks If you have labs (blood work) drawn today and your tests are completely normal, you will receive your results only by: [place] (if you have MyChart) OR A paper copy in the mail If you have any lab test that is abnormal or we need to change your treatment, we will call you to review the results.  Testing/Procedures: NONE  Follow-Up: At [facility], you and your health needs are our priority.  As part of our continuing mission to provide you with exceptional heart care, we have created designated Provider Care Teams.  These Care Teams include your primary Cardiologist (physician) and Advanced Practice Providers (APPs -  Physician Assistants and Nurse Practitioners) who all work together to provide you with the care you need, when you need it. You may see Dr Gwenlyn Found or one of the following Advanced Practice Providers on your designated Care Team:    Kerin Ransom, PA-C  [place], [place]  Coletta Memos, [place]  Your physician wants you to follow-up in: 6 weeks with a Physicians Assistant  Your physician wants you to follow-up in: 3 months with Dr Gwenlyn Found

## 2019-05-08 NOTE — Progress Notes (Signed)
05/08/2019 [name]   1941-03-03  [id number]  Primary Physician Benito Mccreedy, MD Primary Cardiologist: Lorretta Harp MD Garret Reddish, [place], [place]  HPI:  [name] is a 79 y.o. moderately overweight divorced African-American female mother of 4 living children (1 deceased), grandmother of at least 37 grandchildren referred by Dr. Langston Masker at the emergency room for bilateral lower extreme edema.  She relocated from [place] to [place] 4 to 5 years ago to be closer to her family.  She lives with her daughter.  She does have a history of treated hypertension, diabetes and hyperlipidemia.  She is never had a heart attack or stroke.  She complains of dyspnea but denies chest pain.  She was seen in the emergency room on 04/24/2019 with bilateral lower extremity edema and was begun on furosemide p.o. which has resulted in marked improvement in her edema.     Current Meds  Medication Sig  . Accu-Chek FastClix Lancets MISC See admin instructions.  Marland Kitchen amLODipine (NORVASC) 10 MG tablet TK 1 T PO QD  . Blood Glucose Monitoring Suppl (ACCU-CHEK GUIDE ME) w/Device KIT See admin instructions.  . cephALEXin (KEFLEX) 500 MG capsule Take 1 capsule (500 mg total) by mouth 4 (four) times daily.  . Cholecalciferol (VITAMIN D3) 25 MCG (1000 UT) CAPS   . ciprofloxacin (CIPRO) 250 MG tablet TK 1 T PO Q 12 H FOR 5 DAYS  . furosemide (LASIX) 20 MG tablet Take 1 tablet (20 mg total) by mouth daily for 90 doses.  Marland Kitchen gabapentin (NEURONTIN) 100 MG capsule TK 1 C PO BID  . glipiZIDE (GLUCOTROL) 10 MG tablet   . glucose blood (ONE TOUCH ULTRA TEST) test strip TEST BLOOD SUGARS 3 4 TIMES DAILY.  . hydrALAZINE (APRESOLINE) 50 MG tablet   . pravastatin (PRAVACHOL) 20 MG tablet TAKE 1 TABLET BY MOUTH QD AT BEDTIME  . silver sulfADIAZINE (SILVADENE) 1 % cream Apply 1 application topically daily.  . TRADJENTA 5 MG TABS tablet TK 1 T PO QD  . vitamin B-12 (CYANOCOBALAMIN) 1000 MCG tablet      No Known  Allergies  Social History   Socioeconomic History  . Marital status: Divorced    Spouse name: Not on file  . Number of children: Not on file  . Years of education: Not on file  . Highest education level: Not on file  Occupational History  . Not on file  Tobacco Use  . Smoking status: Never Smoker  . Smokeless tobacco: Never Used  Substance and Sexual Activity  . Alcohol use: No  . Drug use: No  . Sexual activity: Not on file  Other Topics Concern  . Not on file  Social History Narrative  . Not on file   Social Determinants of Health   Financial Resource Strain:   . Difficulty of Paying Living Expenses: Not on file  Food Insecurity:   . Worried About Charity fundraiser in the Last Year: Not on file  . Ran Out of Food in the Last Year: Not on file  Transportation Needs:   . Lack of Transportation (Medical): Not on file  . Lack of Transportation (Non-Medical): Not on file  Physical Activity:   . Days of Exercise per Week: Not on file  . Minutes of Exercise per Session: Not on file  Stress:   . Feeling of Stress : Not on file  Social Connections:   . Frequency of Communication with Friends and Family: Not on  file  . Frequency of Social Gatherings with Friends and Family: Not on file  . Attends Religious Services: Not on file  . Active Member of Clubs or Organizations: Not on file  . Attends Archivist Meetings: Not on file  . Marital Status: Not on file  Intimate Partner Violence:   . Fear of Current or Ex-Partner: Not on file  . Emotionally Abused: Not on file  . Physically Abused: Not on file  . Sexually Abused: Not on file     Review of Systems: General: negative for chills, fever, night sweats or weight changes.  Cardiovascular: negative for chest pain, dyspnea on exertion, edema, orthopnea, palpitations, paroxysmal nocturnal dyspnea or shortness of breath Dermatological: negative for rash Respiratory: negative for cough or wheezing Urologic:  negative for hematuria Abdominal: negative for nausea, vomiting, diarrhea, bright red blood per rectum, melena, or hematemesis Neurologic: negative for visual changes, syncope, or dizziness All other systems reviewed and are otherwise negative except as noted above.    Blood pressure 140/73, pulse 88, temperature (!) 97.2 F (36.2 C), height 5' 4.5" (1.638 m), weight 194 lb (88 kg), SpO2 99 %.  General appearance: alert and no distress Neck: no adenopathy, no carotid bruit, supple, symmetrical, trachea midline and thyroid not enlarged, symmetric, no tenderness/mass/nodules Lungs: clear to auscultation bilaterally Heart: There was a summation gallop Extremities: 1-2+ bilateral pitting edema Pulses: 2+ and symmetric Skin: Skin color, texture, turgor normal. No rashes or lesions Neurologic: Alert and oriented X 3, normal strength and tone. Normal symmetric reflexes. Normal coordination and gait  EKG not performed today.  EKG performed 04/25/2019 showed sinus rhythm with right bundle branch block.  ASSESSMENT AND PLAN:   Essential hypertension History of essential potential blood pressure measured today 140/73.  She is on amlodipine and hydralazine.  Hyperlipidemia History of hyperlipidemia on pravastatin with lipid profile performed 12/01/2018 revealing total cholesterol 219, LDL 119 HDL 70.  Bilateral lower extremity edema Patient was seen in the emergency room 04/25/2019 with bilateral lower extreme edema.  She was treated with furosemide.  She has lost a significant amount of weight.  Her edema has markedly improved but not completely resolved.  She does complain of some dyspnea.  Going to increase her furosemide from 20 mg once a day to twice daily.  We will check a basic metabolic panel in 2 weeks, obtain a 2D echocardiogram.  She will see an APP back in 6 weeks and me back in 3 months.      Lorretta Harp MD FACP,FACC,FAHA, [facility] 05/08/2019 12:12 PM

## 2019-05-08 NOTE — Assessment & Plan Note (Signed)
History of essential potential blood pressure measured today 140/73.  She is on amlodipine and hydralazine.

## 2019-05-10 ENCOUNTER — Other Ambulatory Visit: Payer: Self-pay | Admitting: Cardiovascular Disease

## 2019-05-14 DIAGNOSIS — E1165 Type 2 diabetes mellitus with hyperglycemia: Secondary | ICD-10-CM | POA: Diagnosis not present

## 2019-05-18 DIAGNOSIS — R69 Illness, unspecified: Secondary | ICD-10-CM | POA: Diagnosis not present

## 2019-05-18 DIAGNOSIS — Z1231 Encounter for screening mammogram for malignant neoplasm of breast: Secondary | ICD-10-CM | POA: Diagnosis not present

## 2019-05-23 LAB — BASIC METABOLIC PANEL
BUN/Creatinine Ratio: 9 — ABNORMAL LOW (ref 12–28)
BUN: 11 mg/dL (ref 8–27)
CO2: 28 mmol/L (ref 20–29)
Calcium: 9.7 mg/dL (ref 8.7–10.3)
Chloride: 100 mmol/L (ref 96–106)
Creatinine, Ser: 1.16 mg/dL — ABNORMAL HIGH (ref 0.57–1.00)
GFR calc Af Amer: 52 mL/min/{1.73_m2} — ABNORMAL LOW (ref >59)
GFR calc non Af Amer: 46 mL/min/{1.73_m2} — ABNORMAL LOW (ref >59)
Glucose: 182 mg/dL — ABNORMAL HIGH (ref 65–99)
Potassium: 4 mmol/L (ref 3.5–5.2)
Sodium: 141 mmol/L (ref 134–144)

## 2019-06-06 ENCOUNTER — Encounter: Payer: Self-pay | Admitting: Podiatry

## 2019-06-06 ENCOUNTER — Other Ambulatory Visit: Payer: Self-pay

## 2019-06-06 ENCOUNTER — Ambulatory Visit (INDEPENDENT_AMBULATORY_CARE_PROVIDER_SITE_OTHER): Payer: Medicare HMO | Admitting: Podiatry

## 2019-06-06 DIAGNOSIS — E1142 Type 2 diabetes mellitus with diabetic polyneuropathy: Secondary | ICD-10-CM | POA: Diagnosis not present

## 2019-06-06 DIAGNOSIS — M79674 Pain in right toe(s): Secondary | ICD-10-CM | POA: Diagnosis not present

## 2019-06-06 DIAGNOSIS — M79675 Pain in left toe(s): Secondary | ICD-10-CM | POA: Diagnosis not present

## 2019-06-06 DIAGNOSIS — B351 Tinea unguium: Secondary | ICD-10-CM | POA: Diagnosis not present

## 2019-06-06 NOTE — Progress Notes (Addendum)
Complaint:  Visit Type: Patient returns to my office for continued preventative foot care services. Complaint: Patient states" my nails have grown long and thick and become painful to walk and wear shoes" Patient has been diagnosed with DM with neuropathy.. The patient presents for preventative foot care services. No changes to ROS  Podiatric Exam: Vascular: dorsalis pedis and posterior tibial pulses are palpable bilateral. Capillary return is immediate. Temperature gradient is WNL. Skin turgor WNL  Sensorium: Diminished  Semmes Weinstein monofilament test. Diminished  tactile sensation bilaterally. Nail Exam: Pt has thick disfigured discolored nails with subungual debris noted bilateral entire nail hallux through fifth toenails Ulcer Exam: There is no evidence of ulcer or pre-ulcerative changes or infection. Orthopedic Exam: Muscle tone and strength are WNL. No limitations in general ROM. No crepitus or effusions noted. Foot type and digits show no abnormalities. Hallux limitus right foot.  Pes planus  B/L.  Skin: No Porokeratosis. No infection or ulcers  Diagnosis:  Onychomycosis, , Pain in right toe, pain in left toes Diabetic neuropathy  Treatment & Plan Procedures and Treatment: Consent by patient was obtained for treatment procedures.   Debridement of mycotic and hypertrophic toenails, 1 through 5 bilateral and clearing of subungual debris. No ulceration, no infection noted. Patient is concerned about her legs being swollen.  She cannot wear compression socks and is taking water pills.  She desires venous consult. Return Visit-Office Procedure: Patient instructed to return to the office for a follow up visit 3 months for continued evaluation and treatment.    Gardiner Barefoot DPM

## 2019-06-22 ENCOUNTER — Ambulatory Visit: Payer: Medicare HMO | Admitting: General Practice

## 2019-06-22 NOTE — Progress Notes (Deleted)
Cardiology Clinic Note   [name] Date of Encounter: 06/22/2019  Primary Care Provider:  Benito Mccreedy, MD Primary Cardiologist:  No primary care provider on file.  Patient Profile    [name] 79 year old female presents today for follow-up of her essential hypertension, hyperlipidemia, and bilateral lower extremity edema.  Past Medical History    Past Medical History:  Diagnosis Date  . Diabetes mellitus without complication ([place])   . Hypertension    Past Surgical History:  Procedure Laterality Date  . TONSILLECTOMY      Allergies  No Known Allergies  History of Present Illness    [name] has a past medical history of hypertension, diabetes, and hyperlipidemia.  She was referred by Dr. Langston Masker in the emergency room for bilateral lower extremity edema.  She has relocated to [place] from [place] 4 to 5 years ago to be closer to her family.  She has no history of prior heart attack or stroke.  When she was seen and evaluated by Dr. Gwenlyn Found on 05/08/2019 she complained of dyspnea but denied chest pain.  She was seen and evaluated in the emergency department on 04/24/2019.  During that time she had lower extremity edema was started on p.o. furosemide which provides a significant improvement.  She presents to the clinic today for follow-up evaluation and states***  *** denies chest pain, shortness of breath, lower extremity edema, fatigue, palpitations, melena, hematuria, hemoptysis, diaphoresis, weakness, presyncope, syncope, orthopnea, and PND.  Home Medications    Prior to Admission medications   Medication Sig Start Date End Date Taking? Authorizing Provider  Accu-Chek FastClix Lancets MISC See admin instructions. 01/16/19   [provider]  amLODipine (NORVASC) 10 MG tablet TK 1 T PO QD 12/12/18   [provider]  Blood Glucose Monitoring Suppl (ACCU-CHEK GUIDE ME) w/Device KIT See admin instructions. 12/11/18    [provider]  cephALEXin (KEFLEX) 500 MG capsule Take 1 capsule (500 mg total) by mouth 4 (four) times daily. 05/23/18   Veryl Speak, MD  Cholecalciferol (VITAMIN D3) 25 MCG (1000 UT) CAPS  07/29/14   [provider]  ciprofloxacin (CIPRO) 250 MG tablet TK 1 T PO Q 12 H FOR 5 DAYS 08/07/18   [provider]  clopidogrel (PLAVIX) 75 MG tablet Take 75 mg by mouth daily.    [provider]  furosemide (LASIX) 20 MG tablet Take 1 tablet (20 mg total) by mouth 2 (two) times daily. 05/11/19   Lorretta Harp, MD  gabapentin (NEURONTIN) 100 MG capsule TK 1 C PO BID 12/12/18   [provider]  glipiZIDE (GLUCOTROL) 10 MG tablet  02/07/13   [provider]  glucose blood (ONE TOUCH ULTRA TEST) test strip TEST BLOOD SUGARS 3 4 TIMES DAILY. 10/27/17   [provider]  hydrALAZINE (APRESOLINE) 50 MG tablet  07/22/14   [provider]  metFORMIN (GLUCOPHAGE) 1000 MG tablet Take 1,000 mg by mouth 2 (two) times daily. 02/28/19   [provider]  pravastatin (PRAVACHOL) 20 MG tablet TAKE 1 TABLET BY MOUTH QD AT BEDTIME 12/02/18   [provider]  silver sulfADIAZINE (SILVADENE) 1 % cream Apply 1 application topically daily. 03/18/15   Tuchman, Richard C, DPM  TRADJENTA 5 MG TABS tablet TK 1 T PO QD 12/02/18   [provider]  triamcinolone cream (KENALOG) 0.5 % APPLY EXTERNALLY TO THE AFFECTED AREA TWICE DAILY FOR 7 DAYS 05/23/19   [provider]  vitamin  B-12 (CYANOCOBALAMIN) 1000 MCG tablet  07/29/14   [provider]    Family History    No family history on file. She indicated that her mother is deceased. She indicated that her father is deceased.  Social History    Social History   Socioeconomic History  . Marital status: Divorced    Spouse name: Not on file  . Number of children: Not on file  . Years of education: Not on file  . Highest education level: Not on file  Occupational History   . Not on file  Tobacco Use  . Smoking status: Never Smoker  . Smokeless tobacco: Never Used  Substance and Sexual Activity  . Alcohol use: No  . Drug use: No  . Sexual activity: Not on file  Other Topics Concern  . Not on file  Social History Narrative  . Not on file   Social Determinants of Health   Financial Resource Strain:   . Difficulty of Paying Living Expenses: Not on file  Food Insecurity:   . Worried About Charity fundraiser in the Last Year: Not on file  . Ran Out of Food in the Last Year: Not on file  Transportation Needs:   . Lack of Transportation (Medical): Not on file  . Lack of Transportation (Non-Medical): Not on file  Physical Activity:   . Days of Exercise per Week: Not on file  . Minutes of Exercise per Session: Not on file  Stress:   . Feeling of Stress : Not on file  Social Connections:   . Frequency of Communication with Friends and Family: Not on file  . Frequency of Social Gatherings with Friends and Family: Not on file  . Attends Religious Services: Not on file  . Active Member of Clubs or Organizations: Not on file  . Attends Archivist Meetings: Not on file  . Marital Status: Not on file  Intimate Partner Violence:   . Fear of Current or Ex-Partner: Not on file  . Emotionally Abused: Not on file  . Physically Abused: Not on file  . Sexually Abused: Not on file     Review of Systems    General:  No chills, fever, night sweats or weight changes.  Cardiovascular:  No chest pain, dyspnea on exertion, edema, orthopnea, palpitations, paroxysmal nocturnal dyspnea. Dermatological: No rash, lesions/masses Respiratory: No cough, dyspnea Urologic: No hematuria, dysuria Abdominal:   No nausea, vomiting, diarrhea, bright red blood per rectum, melena, or hematemesis Neurologic:  No visual changes, wkns, changes in mental status. All other systems reviewed and are otherwise negative except as noted above.  Physical Exam    VS:  There  were no vitals taken for this visit. , BMI There is no height or weight on file to calculate BMI. GEN: Well nourished, well developed, in no acute distress. HEENT: normal. Neck: Supple, no JVD, carotid bruits, or masses. Cardiac: RRR, no murmurs, rubs, or gallops. No clubbing, cyanosis, edema.  Radials/DP/PT 2+ and equal bilaterally.  Respiratory:  Respirations regular and unlabored, clear to auscultation bilaterally. GI: Soft, nontender, nondistended, BS + x 4. MS: no deformity or atrophy. Skin: warm and dry, no rash. Neuro:  Strength and sensation are intact. Psych: Normal affect.  Accessory Clinical Findings    ECG personally reviewed by me today- *** - No acute changes  EKG 04/26/2019 Sinus rhythm 84 bpm     Assessment & Plan   1.  Essential hypertension-BP today*** Continue amlodipine 10 mg daily Continue  hydralazine 50 mg daily Heart healthy low-sodium diet Increase physical activity as tolerated  Hyperlipidemia-LDL 119 on 12/01/2018 Continue pravastatin 20 mg daily Heart healthy low-sodium high-fiber diet Increase physical activity as tolerated.  Bilateral lower extremity edema-euvolemic today.  Weight today***down from 194 pounds on 05/08/2019.  Presented to the emergency room 04/25/2019 with bilateral lower extremity edema.  She was placed on furosemide at that time and has done well with the medication.  Echocardiogram Continue furosemide 20 mg twice daily   Disposition: Follow-up with Dr. Gwenlyn Found in 3 months.   Jossie Ng. [facility] [place] [address] Office [phone] Fax [phone]

## 2019-06-25 ENCOUNTER — Ambulatory Visit: Payer: Medicare HMO | Admitting: General Practice

## 2019-06-26 NOTE — Progress Notes (Deleted)
Cardiology Clinic Note   [name] Date of Encounter: 06/26/2019  Primary Care Provider:  Benito Mccreedy, MD Primary Cardiologist:  No primary care provider on file.  Patient Profile    [name] 79 year old female presents today for follow-up of her essential hypertension, hyperlipidemia, and bilateral lower extremity edema.  Past Medical History    Past Medical History:  Diagnosis Date  . Diabetes mellitus without complication ([place])   . Hypertension    Past Surgical History:  Procedure Laterality Date  . TONSILLECTOMY      Allergies  No Known Allergies  History of Present Illness    [name] has a past medical history of hypertension, diabetes, and hyperlipidemia.  She was referred by Dr. Langston Masker in the emergency room for bilateral lower extremity edema.  She has relocated to [place] from [place] 4 to 5 years ago to be closer to her family.  She has no history of prior heart attack or stroke.  When she was seen and evaluated by Dr. Gwenlyn Found on 05/08/2019 she complained of dyspnea but denied chest pain.  She was seen and evaluated in the emergency department on 04/24/2019.  During that time she had lower extremity edema was started on p.o. furosemide which provides a significant improvement.  She presents to the clinic today for follow-up evaluation and states***  *** denies chest pain, shortness of breath, lower extremity edema, fatigue, palpitations, melena, hematuria, hemoptysis, diaphoresis, weakness, presyncope, syncope, orthopnea, and PND.  Home Medications    Prior to Admission medications   Medication Sig Start Date End Date Taking? Authorizing Provider  Accu-Chek FastClix Lancets MISC See admin instructions. 01/16/19   [provider]  amLODipine (NORVASC) 10 MG tablet TK 1 T PO QD 12/12/18   [provider]  Blood Glucose Monitoring Suppl (ACCU-CHEK GUIDE ME) w/Device KIT See admin instructions. 12/11/18    [provider]  cephALEXin (KEFLEX) 500 MG capsule Take 1 capsule (500 mg total) by mouth 4 (four) times daily. 05/23/18   Veryl Speak, MD  Cholecalciferol (VITAMIN D3) 25 MCG (1000 UT) CAPS  07/29/14   [provider]  ciprofloxacin (CIPRO) 250 MG tablet TK 1 T PO Q 12 H FOR 5 DAYS 08/07/18   [provider]  clopidogrel (PLAVIX) 75 MG tablet Take 75 mg by mouth daily.    [provider]  furosemide (LASIX) 20 MG tablet Take 1 tablet (20 mg total) by mouth 2 (two) times daily. 05/11/19   Lorretta Harp, MD  gabapentin (NEURONTIN) 100 MG capsule TK 1 C PO BID 12/12/18   [provider]  glipiZIDE (GLUCOTROL) 10 MG tablet  02/07/13   [provider]  glucose blood (ONE TOUCH ULTRA TEST) test strip TEST BLOOD SUGARS 3 4 TIMES DAILY. 10/27/17   [provider]  hydrALAZINE (APRESOLINE) 50 MG tablet  07/22/14   [provider]  metFORMIN (GLUCOPHAGE) 1000 MG tablet Take 1,000 mg by mouth 2 (two) times daily. 02/28/19   [provider]  pravastatin (PRAVACHOL) 20 MG tablet TAKE 1 TABLET BY MOUTH QD AT BEDTIME 12/02/18   [provider]  silver sulfADIAZINE (SILVADENE) 1 % cream Apply 1 application topically daily. 03/18/15   Tuchman, Richard C, DPM  TRADJENTA 5 MG TABS tablet TK 1 T PO QD 12/02/18   [provider]  triamcinolone cream (KENALOG) 0.5 % APPLY EXTERNALLY TO THE AFFECTED AREA TWICE DAILY FOR 7 DAYS 05/23/19   [provider]  vitamin  B-12 (CYANOCOBALAMIN) 1000 MCG tablet  07/29/14   [provider]    Family History    No family history on file. She indicated that her mother is deceased. She indicated that her father is deceased.  Social History    Social History   Socioeconomic History  . Marital status: Divorced    Spouse name: Not on file  . Number of children: Not on file  . Years of education: Not on file  . Highest education level: Not on file  Occupational History   . Not on file  Tobacco Use  . Smoking status: Never Smoker  . Smokeless tobacco: Never Used  Substance and Sexual Activity  . Alcohol use: No  . Drug use: No  . Sexual activity: Not on file  Other Topics Concern  . Not on file  Social History Narrative  . Not on file   Social Determinants of Health   Financial Resource Strain:   . Difficulty of Paying Living Expenses: Not on file  Food Insecurity:   . Worried About Charity fundraiser in the Last Year: Not on file  . Ran Out of Food in the Last Year: Not on file  Transportation Needs:   . Lack of Transportation (Medical): Not on file  . Lack of Transportation (Non-Medical): Not on file  Physical Activity:   . Days of Exercise per Week: Not on file  . Minutes of Exercise per Session: Not on file  Stress:   . Feeling of Stress : Not on file  Social Connections:   . Frequency of Communication with Friends and Family: Not on file  . Frequency of Social Gatherings with Friends and Family: Not on file  . Attends Religious Services: Not on file  . Active Member of Clubs or Organizations: Not on file  . Attends Archivist Meetings: Not on file  . Marital Status: Not on file  Intimate Partner Violence:   . Fear of Current or Ex-Partner: Not on file  . Emotionally Abused: Not on file  . Physically Abused: Not on file  . Sexually Abused: Not on file     Review of Systems    General:  No chills, fever, night sweats or weight changes.  Cardiovascular:  No chest pain, dyspnea on exertion, edema, orthopnea, palpitations, paroxysmal nocturnal dyspnea. Dermatological: No rash, lesions/masses Respiratory: No cough, dyspnea Urologic: No hematuria, dysuria Abdominal:   No nausea, vomiting, diarrhea, bright red blood per rectum, melena, or hematemesis Neurologic:  No visual changes, wkns, changes in mental status. All other systems reviewed and are otherwise negative except as noted above.  Physical Exam    VS:  There  were no vitals taken for this visit. , BMI There is no height or weight on file to calculate BMI. GEN: Well nourished, well developed, in no acute distress. HEENT: normal. Neck: Supple, no JVD, carotid bruits, or masses. Cardiac: RRR, no murmurs, rubs, or gallops. No clubbing, cyanosis, edema.  Radials/DP/PT 2+ and equal bilaterally.  Respiratory:  Respirations regular and unlabored, clear to auscultation bilaterally. GI: Soft, nontender, nondistended, BS + x 4. MS: no deformity or atrophy. Skin: warm and dry, no rash. Neuro:  Strength and sensation are intact. Psych: Normal affect.  Accessory Clinical Findings    ECG personally reviewed by me today- *** - No acute changes  EKG 04/26/2019 Sinus rhythm 84 bpm  Assessment & Plan   1.  Essential hypertension-BP today*** Continue amlodipine 10 mg daily Continue hydralazine 50 mg  daily Heart healthy low-sodium diet Increase physical activity as tolerated  Hyperlipidemia-LDL 119 on 12/01/2018 Continue pravastatin 20 mg daily Heart healthy low-sodium high-fiber diet Increase physical activity as tolerated.  Bilateral lower extremity edema-euvolemic today.  Weight today***down from 194 pounds on 05/08/2019.  Presented to the emergency room 04/25/2019 with bilateral lower extremity edema.  She was placed on furosemide at that time and has done well with the medication.  Echocardiogram Continue furosemide 20 mg twice daily   Disposition: Follow-up with Dr. Gwenlyn Found in 3 months.   Jossie Ng. [facility] [place] [address] Office [phone] Fax [phone]

## 2019-06-27 ENCOUNTER — Ambulatory Visit: Payer: Medicare HMO | Admitting: General Practice

## 2019-06-28 ENCOUNTER — Telehealth: Payer: Medicare HMO | Admitting: Cardiology

## 2019-08-07 ENCOUNTER — Telehealth: Payer: Self-pay | Admitting: Cardiovascular Disease

## 2019-08-07 ENCOUNTER — Ambulatory Visit: Payer: Medicare HMO | Admitting: Cardiovascular Disease

## 2019-08-07 NOTE — Telephone Encounter (Signed)
Spoke with patient's son. Okayed his sister to accompany patient to her appointment for mobility and safety concerns.

## 2019-08-07 NOTE — Telephone Encounter (Signed)
New Message    Pts son is calling and says the  Pt will need assistance to her appt today because she has had dizziness, lightheadedness and uses a walker.  Pts son says his sister Lynelle Smoke will be assisting     Please advise

## 2019-08-08 NOTE — Progress Notes (Signed)
Cardiology Clinic Note   [name] Date of Encounter: 08/09/2019  Primary Care Provider:  Benito Mccreedy, MD Primary Cardiologist:  Quay Burow, MD  Patient Profile    [name] 79 year old female presents today for follow-up evaluation of her lower extremity edema.  Past Medical History    Past Medical History:  Diagnosis Date  . Diabetes mellitus without complication ([place])   . Hypertension    Past Surgical History:  Procedure Laterality Date  . TONSILLECTOMY      Allergies  No Known Allergies  History of Present Illness    [name] has a past medical history of essential hypertension, hyperlipidemia, and bilateral lower extremity edema.  She is a moderately overweight divorced 79 year old African-American female of 4 living children (1 deceased), and grandmother of 23.  She was initially referred by Dr. Langston Masker from the emergency department for bilateral lower extremity edema.  She had relocated from [place] to [place] around 4-5 years ago in order to be closer to her family.  She lives with her daughter.  She does not have a history of MI or CVA.  She was last seen by Dr. Gwenlyn Found on 05/08/2019.  During that time she complained of dyspnea but did not have chest pain.  She had been seen in the emergency department on 04/24/2019 for bilateral lower extremity edema.  She was started on furosemide at that time which produced great improvement in her lower extremity edema.  She presents the clinic today for follow-up evaluation and states she has been having tingling in her bilateral hands.  She feels that her lower extremity swelling is much better.  She has only been taking her furosemide once daily.  When asked about her blood sugar ranges she states that her daughter helps her with her medications and her blood pressures have been running higher.  She has not taken any of her medications this morning.  I will change her furosemide to 30 mg once  daily, give her the salty 6 sheet, have her follow-up with her PCP regarding her hand tingling and her increased blood sugars.  Today she denies chest pain, shortness of breath, lower extremity edema, fatigue, palpitations, melena, hematuria, hemoptysis, diaphoresis, weakness, presyncope, syncope, orthopnea, and PND.    Home Medications    Prior to Admission medications   Medication Sig Start Date End Date Taking? Authorizing Provider  Accu-Chek FastClix Lancets MISC See admin instructions. 01/16/19   [provider]  amLODipine (NORVASC) 10 MG tablet TK 1 T PO QD 12/12/18   [provider]  Blood Glucose Monitoring Suppl (ACCU-CHEK GUIDE ME) w/Device KIT See admin instructions. 12/11/18   [provider]  cephALEXin (KEFLEX) 500 MG capsule Take 1 capsule (500 mg total) by mouth 4 (four) times daily. 05/23/18   Veryl Speak, MD  Cholecalciferol (VITAMIN D3) 25 MCG (1000 UT) CAPS  07/29/14   [provider]  ciprofloxacin (CIPRO) 250 MG tablet TK 1 T PO Q 12 H FOR 5 DAYS 08/07/18   [provider]  clopidogrel (PLAVIX) 75 MG tablet Take 75 mg by mouth daily.    [provider]  furosemide (LASIX) 20 MG tablet Take 1 tablet (20 mg total) by mouth 2 (two) times daily. 05/11/19   Lorretta Harp, MD  gabapentin (NEURONTIN) 100 MG capsule TK 1 C PO BID 12/12/18   [provider]  glipiZIDE (GLUCOTROL) 10 MG tablet  02/07/13   [provider]  glucose blood (ONE  TOUCH ULTRA TEST) test strip TEST BLOOD SUGARS 3 4 TIMES DAILY. 10/27/17   [provider]  hydrALAZINE (APRESOLINE) 50 MG tablet  07/22/14   [provider]  metFORMIN (GLUCOPHAGE) 1000 MG tablet Take 1,000 mg by mouth 2 (two) times daily. 02/28/19   [provider]  pravastatin (PRAVACHOL) 20 MG tablet TAKE 1 TABLET BY MOUTH QD AT BEDTIME 12/02/18   [provider]  silver sulfADIAZINE (SILVADENE) 1 % cream Apply 1 application topically daily.  03/18/15   Tuchman, Richard C, DPM  TRADJENTA 5 MG TABS tablet TK 1 T PO QD 12/02/18   [provider]  triamcinolone cream (KENALOG) 0.5 % APPLY EXTERNALLY TO THE AFFECTED AREA TWICE DAILY FOR 7 DAYS 05/23/19   [provider]  vitamin B-12 (CYANOCOBALAMIN) 1000 MCG tablet  07/29/14   [provider]    Family History    No family history on file. She indicated that her mother is deceased. She indicated that her father is deceased.  Social History    Social History   Socioeconomic History  . Marital status: Divorced    Spouse name: Not on file  . Number of children: Not on file  . Years of education: Not on file  . Highest education level: Not on file  Occupational History  . Not on file  Tobacco Use  . Smoking status: Never Smoker  . Smokeless tobacco: Never Used  Substance and Sexual Activity  . Alcohol use: No  . Drug use: No  . Sexual activity: Not on file  Other Topics Concern  . Not on file  Social History Narrative  . Not on file   Social Determinants of Health   Financial Resource Strain:   . Difficulty of Paying Living Expenses:   Food Insecurity:   . Worried About Charity fundraiser in the Last Year:   . Arboriculturist in the Last Year:   Transportation Needs:   . Film/video editor (Medical):   Marland Kitchen Lack of Transportation (Non-Medical):   Physical Activity:   . Days of Exercise per Week:   . Minutes of Exercise per Session:   Stress:   . Feeling of Stress :   Social Connections:   . Frequency of Communication with Friends and Family:   . Frequency of Social Gatherings with Friends and Family:   . Attends Religious Services:   . Active Member of Clubs or Organizations:   . Attends Archivist Meetings:   Marland Kitchen Marital Status:   Intimate Partner Violence:   . Fear of Current or Ex-Partner:   . Emotionally Abused:   Marland Kitchen Physically Abused:   . Sexually Abused:      Review of Systems    General:  No chills, fever,  night sweats or weight changes.  Cardiovascular:  No chest pain, dyspnea on exertion, edema, orthopnea, palpitations, paroxysmal nocturnal dyspnea. Dermatological: No rash, lesions/masses Respiratory: No cough, dyspnea Urologic: No hematuria, dysuria Abdominal:   No nausea, vomiting, diarrhea, bright red blood per rectum, melena, or hematemesis Neurologic:  No visual changes, wkns, changes in mental status. All other systems reviewed and are otherwise negative except as noted above.  Physical Exam    VS:  BP (!) 146/74   Pulse 77   Ht '5\' 4"'  (1.626 m)   Wt 149 lb 9.6 oz (67.9 kg)   SpO2 98%   BMI 25.68 kg/m  , BMI Body mass index is 25.68 kg/m. GEN: Well nourished, well  developed, in no acute distress. HEENT: normal. Neck: Supple, no JVD, carotid bruits, or masses. Cardiac: RRR, no murmurs, rubs, or gallops. No clubbing, cyanosis, generalized lower extremity nonpitting edema.  Radials/DP/PT 2+ and equal bilaterally.  Respiratory:  Respirations regular and unlabored, clear to auscultation bilaterally. GI: Soft, nontender, nondistended, BS + x 4. MS: no deformity or atrophy. Skin: warm and dry, no rash. Neuro:  Strength and sensation are intact. Psych: Normal affect.  Accessory Clinical Findings    ECG personally reviewed by me today-none today.  EKG 04/25/2019 Sinus rhythm no ST or T wave deviation 82 bpm    Assessment & Plan   1.  Tingling in bilateral hands-tingling along bilateral posterior aspect of hands.  Appears to be related to her diabetes.  She indicates that she has been having higher blood sugars. Order BMP Continue current diabetic regimen Heart healthy carb mod diet-information given Keep Monday appointment with PCP  Bilateral lower extremity edema-generalized nonpitting lower extremity edema today blood pressure.  Weight today 149 which has been stable.  No increased work of breathing or dyspnea. Change  Furosemide to 30 mg daily- pt was not taking BID  Heart healthy low-sodium diet-salty 6 given Lower extremity support stockings Daily weights Increase physical activity as tolerated  Essential hypertension-BP today 146/74 Heart healthy low-sodium diet Increase physical activity as tolerated BP log Order BMP  Hyperlipidemia-LDL 119 12/01/2018 Continue pravastatin Heart healthy low-sodium high-fiber diet Increase physical activity as tolerated  Disposition: Follow-up with Dr. Gwenlyn Found in 6 months.   Jossie Ng. [facility] [place] [address] Office [phone] Fax [phone]

## 2019-08-09 ENCOUNTER — Other Ambulatory Visit: Payer: Self-pay

## 2019-08-09 ENCOUNTER — Ambulatory Visit: Payer: Medicare HMO | Admitting: General Practice

## 2019-08-09 ENCOUNTER — Encounter: Payer: Self-pay | Admitting: General Practice

## 2019-08-09 VITALS — BP 146/74 | HR 77 | Ht 64.0 in | Wt 149.6 lb

## 2019-08-09 DIAGNOSIS — E782 Mixed hyperlipidemia: Secondary | ICD-10-CM

## 2019-08-09 DIAGNOSIS — R600 Localized edema: Secondary | ICD-10-CM | POA: Diagnosis not present

## 2019-08-09 DIAGNOSIS — I10 Essential (primary) hypertension: Secondary | ICD-10-CM

## 2019-08-09 DIAGNOSIS — Z79899 Other long term (current) drug therapy: Secondary | ICD-10-CM

## 2019-08-09 LAB — BASIC METABOLIC PANEL
BUN/Creatinine Ratio: 12 (ref 12–28)
BUN: 18 mg/dL (ref 8–27)
CO2: 26 mmol/L (ref 20–29)
Calcium: 9.9 mg/dL (ref 8.7–10.3)
Chloride: 95 mmol/L — ABNORMAL LOW (ref 96–106)
Creatinine, Ser: 1.51 mg/dL — ABNORMAL HIGH (ref 0.57–1.00)
GFR calc Af Amer: 38 mL/min/{1.73_m2} — ABNORMAL LOW (ref >59)
GFR calc non Af Amer: 33 mL/min/{1.73_m2} — ABNORMAL LOW (ref >59)
Glucose: 405 mg/dL — ABNORMAL HIGH (ref 65–99)
Potassium: 4.8 mmol/L (ref 3.5–5.2)
Sodium: 135 mmol/L (ref 134–144)

## 2019-08-09 MED ORDER — FUROSEMIDE 20 MG PO TABS: 30.0000 mg | ORAL_TABLET | Freq: Every day | ORAL | 3 refills | Status: DC

## 2019-08-09 NOTE — Patient Instructions (Signed)
Medication Instructions:  TAKE FUROSEMIDE 30MG  DAILY-1-1/2 TABS *If you need a refill on your cardiac medications before your next appointment, please call your pharmacy*  Lab Work: BMET [place]  If you have labs (blood work) drawn today and your tests are completely normal, you will receive your results only by:  [place] (if you have MyChart) OR A paper copy in the mail.  If you have any lab test that is abnormal or we need to change your treatment, we will call you to review the results.  Special Instructions PLEASE READ AND FOLLOW CARBOHYDRATE MODIFIED DIET  PLEASE READ AND FOLLOW SALTY 6-ATTACHED  Follow-Up: MAKE SURE TO GO TO YOUR APPOINTMENT Monday WITH PCP  Your next appointment:  6 month(s) Please call our office 2 months in advance to schedule this appointment. In Person with Quay Burow, MD  At [facility], you and your health needs are our priority.  As part of our continuing mission to provide you with exceptional heart care, we have created designated Provider Care Teams.  These Care Teams include your primary Cardiologist (physician) and Advanced Practice Providers (APPs -  Physician Assistants and Nurse Practitioners) who all work together to provide you with the care you need, when you need it.  We recommend signing up for the patient portal called "MyChart".  Sign up information is provided on this After Visit Summary.  MyChart is used to connect with patients for Virtual Visits (Telemedicine).  Patients are able to view lab/test results, encounter notes, upcoming appointments, etc.  Non-urgent messages can be sent to your provider as well.   To learn more about what you can do with MyChart, go to [email or web address].          Carbohydrate modified diet  Carbohydrate counting is a method of keeping track of how many carbohydrates you eat. Eating carbohydrates naturally increases the amount of sugar (glucose) in the blood. Counting  how many carbohydrates you eat helps keep your blood glucose within normal limits, which helps you manage your diabetes (diabetes mellitus). It is important to know how many carbohydrates you can safely have in each meal. This is different for every person. A diet and nutrition specialist (registered dietitian) can help you make a meal plan and calculate how many carbohydrates you should have at each meal and snack. Carbohydrates are found in the following foods:  Grains, such as breads and cereals.  Dried beans and soy products.  Starchy vegetables, such as potatoes, peas, and corn.  Fruit and fruit juices.  Milk and yogurt.  Sweets and snack foods, such as cake, cookies, candy, chips, and soft drinks. How do I count carbohydrates? There are two ways to count carbohydrates in food. You can use either of the methods or a combination of both. Reading "Nutrition Facts" on packaged food The "Nutrition Facts" list is included on the labels of almost all packaged foods and beverages in the [place] It includes:  The serving size.  Information about nutrients in each serving, including the grams (g) of carbohydrate per serving. To use the "Nutrition Facts":  Decide how many servings you will have.  Multiply the number of servings by the number of carbohydrates per serving.  The resulting number is the total amount of carbohydrates that you will be having. Learning standard serving sizes of other foods When you eat carbohydrate foods that are not packaged or do not include "Nutrition Facts" on the label, you need to measure the servings in order  to count the amount of carbohydrates:  Measure the foods that you will eat with a food scale or measuring cup, if needed.  Decide how many standard-size servings you will eat.  Multiply the number of servings by 15. Most carbohydrate-rich foods have about 15 g of carbohydrates per serving. ? For example, if you eat 8 oz (170 g) of strawberries, you  will have eaten 2 servings and 30 g of carbohydrates (2 servings x 15 g = 30 g).  For foods that have more than one food mixed, such as soups and casseroles, you must count the carbohydrates in each food that is included. The following list contains standard serving sizes of common carbohydrate-rich foods. Each of these servings has about 15 g of carbohydrates:   hamburger bun or  English muffin.   oz (15 mL) syrup.   oz (14 g) jelly.  1 slice of bread.  1 six-inch tortilla.  3 oz (85 g) cooked rice or pasta.  4 oz (113 g) cooked dried beans.  4 oz (113 g) starchy vegetable, such as peas, corn, or potatoes.  4 oz (113 g) hot cereal.  4 oz (113 g) mashed potatoes or  of a large baked potato.  4 oz (113 g) canned or frozen fruit.  4 oz (120 mL) fruit juice.  4-6 crackers.  6 chicken nuggets.  6 oz (170 g) unsweetened dry cereal.  6 oz (170 g) plain fat-free yogurt or yogurt sweetened with artificial sweeteners.  8 oz (240 mL) milk.  8 oz (170 g) fresh fruit or one small piece of fruit.  24 oz (680 g) popped popcorn. Example of carbohydrate counting Sample meal  3 oz (85 g) chicken breast.  6 oz (170 g) brown rice.  4 oz (113 g) corn.  8 oz (240 mL) milk.  8 oz (170 g) strawberries with sugar-free whipped topping. Carbohydrate calculation 1. Identify the foods that contain carbohydrates: ? Rice. ? Corn. ? Milk. ? Strawberries. 2. Calculate how many servings you have of each food: ? 2 servings rice. ? 1 serving corn. ? 1 serving milk. ? 1 serving strawberries. 3. Multiply each number of servings by 15 g: ? 2 servings rice x 15 g = 30 g. ? 1 serving corn x 15 g = 15 g. ? 1 serving milk x 15 g = 15 g. ? 1 serving strawberries x 15 g = 15 g. 4. Add together all of the amounts to find the total grams of carbohydrates eaten: ? 30 g + 15 g + 15 g + 15 g = 75 g of carbohydrates total. Summary  Carbohydrate counting is a method of keeping track of  how many carbohydrates you eat.  Eating carbohydrates naturally increases the amount of sugar (glucose) in the blood.  Counting how many carbohydrates you eat helps keep your blood glucose within normal limits, which helps you manage your diabetes.  A diet and nutrition specialist (registered dietitian) can help you make a meal plan and calculate how many carbohydrates you should have at each meal and snack. This information is not intended to replace advice given to you by your health care provider. Make sure you discuss any questions you have with your health care provider.

## 2019-08-09 NOTE — Progress Notes (Signed)
Cardiology Clinic Note   [name] Date of Encounter: 08/09/2019  Primary Care Provider:  Benito Mccreedy, MD Primary Cardiologist:  Quay Burow, MD  Patient Profile    [name] 79 year old female presents today for follow-up evaluation of her lower extremity edema.  Past Medical History    Past Medical History:  Diagnosis Date  . Diabetes mellitus without complication ([place])   . Hypertension    Past Surgical History:  Procedure Laterality Date  . TONSILLECTOMY      Allergies  No Known Allergies  History of Present Illness    [name] has a past medical history of essential hypertension, hyperlipidemia, and bilateral lower extremity edema.  She is a moderately overweight divorced 79 year old African-American female of 4 living children (1 deceased), and grandmother of 29.  She was initially referred by Dr. Langston Masker from the emergency department for bilateral lower extremity edema.  She had relocated from [place] to [place] around 4-5 years ago in order to be closer to her family.  She lives with her daughter.  She does not have a history of MI or CVA.  She was last seen by Dr. Gwenlyn Found on 05/08/2019.  During that time she complained of dyspnea but did not have chest pain.  She had been seen in the emergency department on 04/24/2019 for bilateral lower extremity edema.  She was started on furosemide at that time which produced great improvement in her lower extremity edema.  She presents the clinic today for follow-up evaluation and states she has been having tingling in her bilateral hands.  She feels that her lower extremity swelling is much better.  She has only been taking her furosemide once daily.  When asked about her blood sugar ranges she states that her daughter helps her with her medications and her blood pressures have been running higher.  She has not taken any of her medications this morning.  I will change her furosemide to 30 mg once  daily, give her the salty 6 sheet, have her follow-up with her PCP regarding her hand tingling and her increased blood sugars.  Today she denies chest pain, shortness of breath, lower extremity edema, fatigue, palpitations, melena, hematuria, hemoptysis, diaphoresis, weakness, presyncope, syncope, orthopnea, and PND.    Home Medications    Prior to Admission medications   Medication Sig Start Date End Date Taking? Authorizing Provider  Accu-Chek FastClix Lancets MISC See admin instructions. 01/16/19   [provider]  amLODipine (NORVASC) 10 MG tablet TK 1 T PO QD 12/12/18   [provider]  Blood Glucose Monitoring Suppl (ACCU-CHEK GUIDE ME) w/Device KIT See admin instructions. 12/11/18   [provider]  cephALEXin (KEFLEX) 500 MG capsule Take 1 capsule (500 mg total) by mouth 4 (four) times daily. 05/23/18   Veryl Speak, MD  Cholecalciferol (VITAMIN D3) 25 MCG (1000 UT) CAPS  07/29/14   [provider]  ciprofloxacin (CIPRO) 250 MG tablet TK 1 T PO Q 12 H FOR 5 DAYS 08/07/18   [provider]  clopidogrel (PLAVIX) 75 MG tablet Take 75 mg by mouth daily.    [provider]  furosemide (LASIX) 20 MG tablet Take 1 tablet (20 mg total) by mouth 2 (two) times daily. 05/11/19   Lorretta Harp, MD  gabapentin (NEURONTIN) 100 MG capsule TK 1 C PO BID 12/12/18   [provider]  glipiZIDE (GLUCOTROL) 10 MG tablet  02/07/13   [provider]  glucose blood (ONE  TOUCH ULTRA TEST) test strip TEST BLOOD SUGARS 3 4 TIMES DAILY. 10/27/17   [provider]  hydrALAZINE (APRESOLINE) 50 MG tablet  07/22/14   [provider]  metFORMIN (GLUCOPHAGE) 1000 MG tablet Take 1,000 mg by mouth 2 (two) times daily. 02/28/19   [provider]  pravastatin (PRAVACHOL) 20 MG tablet TAKE 1 TABLET BY MOUTH QD AT BEDTIME 12/02/18   [provider]  silver sulfADIAZINE (SILVADENE) 1 % cream Apply 1 application topically daily.  03/18/15   Tuchman, Richard C, DPM  TRADJENTA 5 MG TABS tablet TK 1 T PO QD 12/02/18   [provider]  triamcinolone cream (KENALOG) 0.5 % APPLY EXTERNALLY TO THE AFFECTED AREA TWICE DAILY FOR 7 DAYS 05/23/19   [provider]  vitamin B-12 (CYANOCOBALAMIN) 1000 MCG tablet  07/29/14   [provider]    Family History    No family history on file. She indicated that her mother is deceased. She indicated that her father is deceased.  Social History    Social History   Socioeconomic History  . Marital status: Divorced    Spouse name: Not on file  . Number of children: Not on file  . Years of education: Not on file  . Highest education level: Not on file  Occupational History  . Not on file  Tobacco Use  . Smoking status: Never Smoker  . Smokeless tobacco: Never Used  Substance and Sexual Activity  . Alcohol use: No  . Drug use: No  . Sexual activity: Not on file  Other Topics Concern  . Not on file  Social History Narrative  . Not on file   Social Determinants of Health   Financial Resource Strain:   . Difficulty of Paying Living Expenses:   Food Insecurity:   . Worried About Charity fundraiser in the Last Year:   . Arboriculturist in the Last Year:   Transportation Needs:   . Film/video editor (Medical):   Marland Kitchen Lack of Transportation (Non-Medical):   Physical Activity:   . Days of Exercise per Week:   . Minutes of Exercise per Session:   Stress:   . Feeling of Stress :   Social Connections:   . Frequency of Communication with Friends and Family:   . Frequency of Social Gatherings with Friends and Family:   . Attends Religious Services:   . Active Member of Clubs or Organizations:   . Attends Archivist Meetings:   Marland Kitchen Marital Status:   Intimate Partner Violence:   . Fear of Current or Ex-Partner:   . Emotionally Abused:   Marland Kitchen Physically Abused:   . Sexually Abused:      Review of Systems    General:  No chills, fever,  night sweats or weight changes.  Cardiovascular:  No chest pain, dyspnea on exertion, edema, orthopnea, palpitations, paroxysmal nocturnal dyspnea. Dermatological: No rash, lesions/masses Respiratory: No cough, dyspnea Urologic: No hematuria, dysuria Abdominal:   No nausea, vomiting, diarrhea, bright red blood per rectum, melena, or hematemesis Neurologic:  No visual changes, wkns, changes in mental status. All other systems reviewed and are otherwise negative except as noted above.  Physical Exam    VS:  BP (!) 146/74   Pulse 77   Ht '5\' 4"'  (1.626 m)   Wt 149 lb 9.6 oz (67.9 kg)   SpO2 98%   BMI 25.68 kg/m  , BMI Body mass index is 25.68 kg/m. GEN: Well nourished, well  developed, in no acute distress. HEENT: normal. Neck: Supple, no JVD, carotid bruits, or masses. Cardiac: RRR, no murmurs, rubs, or gallops. No clubbing, cyanosis, generalized lower extremity nonpitting edema.  Radials/DP/PT 2+ and equal bilaterally.  Respiratory:  Respirations regular and unlabored, clear to auscultation bilaterally. GI: Soft, nontender, nondistended, BS + x 4. MS: no deformity or atrophy. Skin: warm and dry, no rash. Neuro:  Strength and sensation are intact. Psych: Normal affect.  Accessory Clinical Findings    ECG personally reviewed by me today-none today.  EKG 04/25/2019 Sinus rhythm no ST or T wave deviation 82 bpm    Assessment & Plan   1.  Tingling in bilateral hands-tingling along bilateral posterior aspect of hands.  Appears to be related to her diabetes.  She indicates that she has been having higher blood sugars. Order BMP Continue current diabetic regimen Heart healthy carb mod diet-information given Keep Monday appointment with PCP  Bilateral lower extremity edema-generalized nonpitting lower extremity edema today blood pressure. Weight today 149 pounds which has been stable.  No increased work of breathing or dyspnea. Change  Furosemide to 30 mg daily- pt was not taking  BID Heart healthy low-sodium diet-salty 6 given Lower extremity support stockings Daily weights Increase physical activity as tolerated  Essential hypertension-BP today 146/74.  Had not taken blood pressure medications today. Heart healthy low-sodium diet Increase physical activity as tolerated BP log Order BMP  Hyperlipidemia-LDL 119 12/01/2018 Continue pravastatin 20 mg daily Heart healthy low-sodium high-fiber diet Increase physical activity as tolerated  Disposition: Follow-up with Dr. Gwenlyn Found in 6 months.   Jossie Ng. [facility] [address] [address] Office [phone] Fax [phone]

## 2019-08-31 ENCOUNTER — Ambulatory Visit: Payer: Medicare HMO | Admitting: Cardiovascular Disease

## 2019-09-05 ENCOUNTER — Ambulatory Visit: Payer: Medicare HMO | Admitting: Podiatry

## 2019-09-05 ENCOUNTER — Encounter: Payer: Self-pay | Admitting: Podiatry

## 2019-09-05 ENCOUNTER — Other Ambulatory Visit: Payer: Self-pay

## 2019-09-05 DIAGNOSIS — M79674 Pain in right toe(s): Secondary | ICD-10-CM | POA: Diagnosis not present

## 2019-09-05 DIAGNOSIS — B351 Tinea unguium: Secondary | ICD-10-CM | POA: Diagnosis not present

## 2019-09-05 DIAGNOSIS — M79675 Pain in left toe(s): Secondary | ICD-10-CM | POA: Diagnosis not present

## 2019-09-05 DIAGNOSIS — E1142 Type 2 diabetes mellitus with diabetic polyneuropathy: Secondary | ICD-10-CM

## 2019-09-05 NOTE — Progress Notes (Signed)
This patient returns to my office for at risk foot care.  This patient requires this care by a professional since this patient will be at risk due to having  Diabetes.  This patient is unable to cut nails herself since the patient cannot reach her nails.These nails are painful walking and wearing shoes.  This patient presents for at risk foot care today.  General Appearance  Alert, conversant and in no acute stress.  Vascular  Dorsalis pedis and posterior tibial  pulses are palpable  bilaterally.  Capillary return is within normal limits  bilaterally. Temperature is within normal limits  bilaterally.  Neurologic  Senn-Weinstein monofilament wire test diminished   bilaterally. Muscle power within normal limits bilaterally.  Nails Thick disfigured discolored nails with subungual debris  from hallux to fifth toes bilaterally. No evidence of bacterial infection or drainage bilaterally.  Orthopedic  No limitations of motion  feet .  No crepitus or effusions noted.  No bony pathology or digital deformities noted.  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  Pes planus  Skin  normotropic skin with no porokeratosis noted bilaterally.  No signs of infections or ulcers noted.     Onychomycosis  Pain in right toes  Pain in left toes  Consent was obtained for treatment procedures.   Mechanical debridement of nails 1-5  bilaterally performed with a nail nipper.  Filed with dremel without incident.    Return office visit   3 months                   Told patient to return for periodic foot care and evaluation due to potential at risk complications.   Gardiner Barefoot DPM

## 2019-12-05 ENCOUNTER — Encounter: Payer: Self-pay | Admitting: Podiatry

## 2019-12-05 ENCOUNTER — Ambulatory Visit: Payer: Medicare HMO | Admitting: Podiatry

## 2019-12-05 ENCOUNTER — Other Ambulatory Visit: Payer: Self-pay

## 2019-12-05 DIAGNOSIS — E1142 Type 2 diabetes mellitus with diabetic polyneuropathy: Secondary | ICD-10-CM

## 2019-12-05 DIAGNOSIS — M205X1 Other deformities of toe(s) (acquired), right foot: Secondary | ICD-10-CM

## 2019-12-05 DIAGNOSIS — M79674 Pain in right toe(s): Secondary | ICD-10-CM | POA: Diagnosis not present

## 2019-12-05 DIAGNOSIS — B351 Tinea unguium: Secondary | ICD-10-CM | POA: Diagnosis not present

## 2019-12-05 DIAGNOSIS — M79675 Pain in left toe(s): Secondary | ICD-10-CM

## 2019-12-05 NOTE — Progress Notes (Signed)
This patient returns to my office for at risk foot care.  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  Patient is taking plavix..  This patient is unable to cut nails herself since the patient cannot reach her nails.These nails are painful walking and wearing shoes.  This patient presents for at risk foot care today. Patient asks for diabetic shoes.  General Appearance  Alert, conversant and in no acute stress.  Vascular  Dorsalis pedis and posterior tibial  pulses are palpable  bilaterally.  Capillary return is within normal limits  bilaterally. Temperature is within normal limits  bilaterally.  Neurologic  Senn-Weinstein monofilament wire test diminished   bilaterally. Muscle power within normal limits bilaterally.  Nails Thick disfigured discolored nails with subungual debris  from hallux to fifth toes bilaterally. No evidence of bacterial infection or drainage bilaterally.  Orthopedic  No limitations of motion  feet .  No crepitus or effusions noted.  No bony pathology or digital deformities noted.  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  Pes planus  Skin  normotropic skin with no porokeratosis noted bilaterally.  No signs of infections or ulcers noted.     Onychomycosis  Pain in right toes  Pain in left toes  Pes planus.  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    Consent was obtained for treatment procedures.   Mechanical debridement of nails 1-5  bilaterally performed with a nail nipper.  Filed with dremel without incident.  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 Patient to make an appointment with Liliane Channel.   Return office visit   3 months                   Told patient to return for periodic foot care and evaluation due to potential at risk complications.   Gardiner Barefoot DPM

## 2019-12-28 ENCOUNTER — Other Ambulatory Visit: Payer: Medicare HMO | Admitting: Orthotics

## 2019-12-28 ENCOUNTER — Other Ambulatory Visit: Payer: Self-pay

## 2020-03-07 ENCOUNTER — Ambulatory Visit: Payer: Medicare HMO | Admitting: Podiatry

## 2020-03-14 ENCOUNTER — Ambulatory Visit: Payer: Medicare HMO | Admitting: Cardiovascular Disease

## 2020-03-19 ENCOUNTER — Encounter: Payer: Self-pay | Admitting: Cardiovascular Disease

## 2020-03-19 ENCOUNTER — Ambulatory Visit (INDEPENDENT_AMBULATORY_CARE_PROVIDER_SITE_OTHER): Payer: Medicare HMO | Admitting: Cardiovascular Disease

## 2020-03-19 VITALS — BP 144/64 | HR 80 | Ht 64.0 in | Wt 167.0 lb

## 2020-03-19 DIAGNOSIS — E782 Mixed hyperlipidemia: Secondary | ICD-10-CM

## 2020-03-19 DIAGNOSIS — R600 Localized edema: Secondary | ICD-10-CM

## 2020-03-19 DIAGNOSIS — I10 Essential (primary) hypertension: Secondary | ICD-10-CM | POA: Diagnosis not present

## 2020-03-19 NOTE — Progress Notes (Signed)
03/19/2020 [name]   July 06, 1940  [id number]  Primary Physician Benito Mccreedy, MD Primary Cardiologist: Lorretta Harp MD Garret Redd[facility], [place], [place]  HPI:  [name] is a 79 y.o.  moderately overweight divorced African-American female mother of 4 living children (1 deceased), grandmother of at least 51 grandchildren referred by Dr. Langston Masker at the emergency room for bilateral lower extreme edema.  She relocated from [place] to [place] 4 to 5 years ago to be closer to her family.  She lives with her daughter.  I last saw her in the office 05/08/2019. She does have a history of treated hypertension, diabetes and hyperlipidemia.  She is never had a heart attack or stroke.  She complains of dyspnea but denies chest pain.  She was seen in the emergency room on 04/24/2019 with bilateral lower extremity edema and was begun on furosemide p.o. which has resulted in marked improvement in her edema.  Since I saw her close to a year ago she continues to do well.  She has minimal ankle edema.  She denies chest pain or shortness of breath.   Current Meds  Medication Sig  . Accu-Chek FastClix Lancets MISC See admin instructions.  Marland Kitchen amLODipine (NORVASC) 10 MG tablet TK 1 T PO QD  . BD PEN NEEDLE NANO 2ND GEN 32G X 4 MM MISC 3 (three) times daily. as directed  . Blood Glucose Monitoring Suppl (ACCU-CHEK GUIDE ME) w/Device KIT See admin instructions.  . Cholecalciferol (VITAMIN D3) 25 MCG (1000 UT) CAPS   . clopidogrel (PLAVIX) 75 MG tablet Take 75 mg by mouth daily.  . furosemide (LASIX) 20 MG tablet Take 1.5 tablets (30 mg total) by mouth daily.  Marland Kitchen gabapentin (NEURONTIN) 100 MG capsule TK 1 C PO BID  . glimepiride (AMARYL) 2 MG tablet Take 2 mg by mouth daily.  Marland Kitchen glipiZIDE (GLUCOTROL) 10 MG tablet   . glucose blood (ONE TOUCH ULTRA TEST) test strip TEST BLOOD SUGARS 3 4 TIMES DAILY.  . hydrALAZINE (APRESOLINE) 50 MG tablet   . insulin lispro (HUMALOG) 100 UNIT/ML KwikPen   .  metFORMIN (GLUCOPHAGE) 1000 MG tablet Take 1,000 mg by mouth 2 (two) times daily.  . pravastatin (PRAVACHOL) 20 MG tablet TAKE 1 TABLET BY MOUTH QD AT BEDTIME  . silver sulfADIAZINE (SILVADENE) 1 % cream Apply 1 application topically daily.  . TRADJENTA 5 MG TABS tablet TK 1 T PO QD  . triamcinolone cream (KENALOG) 0.5 % APPLY EXTERNALLY TO THE AFFECTED AREA TWICE DAILY FOR 7 DAYS  . vitamin B-12 (CYANOCOBALAMIN) 1000 MCG tablet      No Known Allergies  Social History   Socioeconomic History  . Marital status: Divorced    Spouse name: Not on file  . Number of children: Not on file  . Years of education: Not on file  . Highest education level: Not on file  Occupational History  . Not on file  Tobacco Use  . Smoking status: Never Smoker  . Smokeless tobacco: Never Used  Vaping Use  . Vaping Use: Never used  Substance and Sexual Activity  . Alcohol use: No  . Drug use: No  . Sexual activity: Not on file  Other Topics Concern  . Not on file  Social History Narrative  . Not on file   Social Determinants of Health   Financial Resource Strain:   . Difficulty of Paying Living Expenses: Not on file  Food Insecurity:   . Worried About Estate manager/land agent  of Food in the Last Year: Not on file  . Ran Out of Food in the Last Year: Not on file  Transportation Needs:   . Lack of Transportation (Medical): Not on file  . Lack of Transportation (Non-Medical): Not on file  Physical Activity:   . Days of Exercise per Week: Not on file  . Minutes of Exercise per Session: Not on file  Stress:   . Feeling of Stress : Not on file  Social Connections:   . Frequency of Communication with Friends and Family: Not on file  . Frequency of Social Gatherings with Friends and Family: Not on file  . Attends Religious Services: Not on file  . Active Member of Clubs or Organizations: Not on file  . Attends Archivist Meetings: Not on file  . Marital Status: Not on file  Intimate Partner  Violence:   . Fear of Current or Ex-Partner: Not on file  . Emotionally Abused: Not on file  . Physically Abused: Not on file  . Sexually Abused: Not on file     Review of Systems: General: negative for chills, fever, night sweats or weight changes.  Cardiovascular: negative for chest pain, dyspnea on exertion, edema, orthopnea, palpitations, paroxysmal nocturnal dyspnea or shortness of breath Dermatological: negative for rash Respiratory: negative for cough or wheezing Urologic: negative for hematuria Abdominal: negative for nausea, vomiting, diarrhea, bright red blood per rectum, melena, or hematemesis Neurologic: negative for visual changes, syncope, or dizziness All other systems reviewed and are otherwise negative except as noted above.    Blood pressure (!) 144/64, pulse 80, height _0  (1.626 m), weight 167 lb (75.8 kg).  General appearance: alert and no distress Neck: no adenopathy, no carotid bruit, no JVD, supple, symmetrical, trachea midline and thyroid not enlarged, symmetric, no tenderness/mass/nodules Lungs: clear to auscultation bilaterally Heart: regular rate and rhythm, S1, S2 normal, no murmur, click, rub or gallop Extremities: extremities normal, atraumatic, no cyanosis or edema Pulses: 2+ and symmetric Skin: Skin color, texture, turgor normal. No rashes or lesions Neurologic: Alert and oriented X 3, normal strength and tone. Normal symmetric reflexes. Normal coordination and gait  EKG sinus rhythm at 80 with right bundle branch block and left axis deviation.  I personally reviewed this EKG.   ASSESSMENT AND PLAN:   Essential hypertension History of essential hypertension blood pressure measured today 144/64.  She is on hydralazine.  Hyperlipidemia History of hyperlipidemia on statin therapy with lipid profile performed 02/15/2020 revealing total cholesterol 224, LDL of 119 and HDL of 113.  Bilateral lower extremity edema History of bilateral lower  extremity edema in the past improved on furosemide.      Lorretta Harp MD FACP,FACC,FAHA, [facility] 03/19/2020 10:52 AM

## 2020-03-19 NOTE — Assessment & Plan Note (Signed)
History of essential hypertension blood pressure measured today 144/64.  She is on hydralazine.

## 2020-03-19 NOTE — Assessment & Plan Note (Signed)
History of hyperlipidemia on statin therapy with lipid profile performed 02/15/2020 revealing total cholesterol 224, LDL of 119 and HDL of 113.

## 2020-03-19 NOTE — Assessment & Plan Note (Signed)
History of bilateral lower extremity edema in the past improved on furosemide.

## 2020-03-19 NOTE — Patient Instructions (Signed)

## 2020-05-15 DIAGNOSIS — I872 Venous insufficiency (chronic) (peripheral): Secondary | ICD-10-CM | POA: Diagnosis not present

## 2020-05-15 DIAGNOSIS — I509 Heart failure, unspecified: Secondary | ICD-10-CM | POA: Diagnosis not present

## 2020-05-15 DIAGNOSIS — E7800 Pure hypercholesterolemia, unspecified: Secondary | ICD-10-CM | POA: Diagnosis not present

## 2020-05-15 DIAGNOSIS — I119 Hypertensive heart disease without heart failure: Secondary | ICD-10-CM | POA: Diagnosis not present

## 2020-05-15 DIAGNOSIS — E1142 Type 2 diabetes mellitus with diabetic polyneuropathy: Secondary | ICD-10-CM | POA: Diagnosis not present

## 2020-05-15 DIAGNOSIS — N1832 Chronic kidney disease, stage 3b: Secondary | ICD-10-CM | POA: Diagnosis not present

## 2020-05-15 DIAGNOSIS — E1165 Type 2 diabetes mellitus with hyperglycemia: Secondary | ICD-10-CM | POA: Diagnosis not present

## 2020-05-15 DIAGNOSIS — E559 Vitamin D deficiency, unspecified: Secondary | ICD-10-CM | POA: Diagnosis not present

## 2020-05-15 DIAGNOSIS — I10 Essential (primary) hypertension: Secondary | ICD-10-CM | POA: Diagnosis not present

## 2020-05-28 ENCOUNTER — Ambulatory Visit: Payer: Self-pay | Admitting: Cardiology

## 2020-05-28 NOTE — Progress Notes (Deleted)
Primary Physician/Referring:  Benito Mccreedy, MD  Patient ID: [name], female    DOB: [birth year], 80 y.o.   MRN: [id number]  No chief complaint on file.  HPI:    [name]  is a 80 y.o. 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  She was evaluated by Dr. Quay Burow in November 2021 for evaluation of chronic leg edema which improved with furosemide and management of hypertension.  She had not had any cardiac work-up.  Past Medical History:  Diagnosis Date  . Diabetes mellitus without complication ([place])   . Hypertension    Past Surgical History:  Procedure Laterality Date  . TONSILLECTOMY     No family history on file.  Social History   Tobacco Use  . Smoking status: Never Smoker  . Smokeless tobacco: Never Used  Substance Use Topics  . Alcohol use: No   Marital Status: Divorced  ROS  ***ROS Objective  There were no vitals taken for this visit.  Vitals with BMI 03/19/2020 08/09/2019 08/09/2019  Height 5' 4" - 5' 4"  Weight 167 lbs - 149 lbs 10 oz  BMI 19.14 - 78.29  Systolic 562 130 865  Diastolic 64 74 72  Pulse 80 - 77     ***Physical Exam Laboratory examination:   Recent Labs    08/09/19 1104  NA 135  K 4.8  CL 95*  CO2 26  GLUCOSE 405*  BUN 18  CREATININE 1.51*  CALCIUM 9.9  GFRNONAA 33*  GFRAA 38*   CrCl cannot be calculated (Patient's most recent lab result is older than the maximum 21 days allowed.).  CMP Latest Ref Rng & Units 08/09/2019 05/22/2019 04/25/2019  Glucose 65 - 99 mg/dL 405(H) 182(H) 185(H)  BUN 8 - 27 mg/dL 18 11 28(H)  Creatinine 0.57 - 1.00 mg/dL 1.51(H) 1.16(H) 1.36(H)  Sodium 134 - 144 mmol/L 135 141 135  Potassium 3.5 - 5.2 mmol/L 4.8 4.0 4.5  Chloride 96 - 106 mmol/L 95(L) 100 105  CO2 20 - 29 mmol/L _0 Calcium 8.7  - 10.3 mg/dL 9.9 9.7 8.8(L)   CBC Latest Ref Rng & Units 04/25/2019 05/23/2018  WBC 4.0 - 10.5 K/uL 4.8 4.4  Hemoglobin 12.0 - 15.0 g/dL 8.7(L) 11.6(L)  Hematocrit 36.0 - 46.0 % 27.9(L) 37.7  Platelets 150 - 400 K/uL 209 177    Lipid Panel No results for input(s): CHOL, TRIG, LDLCALC, VLDL, HDL, CHOLHDL, LDLDIRECT in the last 8760 hours.  HEMOGLOBIN A1C No results found for: HGBA1C, MPG TSH No results for input(s): TSH in the last 8760 hours.  External labs:   Labs 05/15/2020:  Serum glucose 184 mg, BUN 26, creatinine 1.60, EGFR 35 mL, sodium 137, potassium 4.3, CMP otherwise normal.  A1c 10.0%.  BNP 08/10/1975.  Total cholesterol 224, triglycerides 46, HDL 113, LDL 103.  Non-HDL cholesterol 111.  Labs 10/20/2016: TSH normal.  Medications and allergies  No Known Allergies   Outpatient Medications Prior to Visit  Medication Sig Dispense Refill  . Accu-Chek FastClix Lancets MISC See admin instructions.    Marland Kitchen amLODipine (NORVASC) 10 MG tablet TK 1 T PO QD    . BD PEN NEEDLE NANO 2ND GEN 32G X 4 MM MISC 3 (three) times daily. as directed    . Blood Glucose Monitoring Suppl (ACCU-CHEK GUIDE ME) w/Device KIT See admin instructions.    Marland Kitchen  Cholecalciferol (VITAMIN D3) 25 MCG (1000 UT) CAPS     . clopidogrel (PLAVIX) 75 MG tablet Take 75 mg by mouth daily.    . furosemide (LASIX) 20 MG tablet Take 1.5 tablets (30 mg total) by mouth daily. 180 tablet 3  . gabapentin (NEURONTIN) 100 MG capsule TK 1 C PO BID    . glimepiride (AMARYL) 2 MG tablet Take 2 mg by mouth daily.    Marland Kitchen glipiZIDE (GLUCOTROL) 10 MG tablet     . glucose blood (ONE TOUCH ULTRA TEST) test strip TEST BLOOD SUGARS 3 4 TIMES DAILY.    . hydrALAZINE (APRESOLINE) 50 MG tablet     . insulin lispro (HUMALOG) 100 UNIT/ML KwikPen     . metFORMIN (GLUCOPHAGE) 1000 MG tablet Take 1,000 mg by mouth 2 (two) times daily.    . pravastatin (PRAVACHOL) 20 MG tablet TAKE 1 TABLET BY MOUTH QD AT BEDTIME    . silver sulfADIAZINE  (SILVADENE) 1 % cream Apply 1 application topically daily. 50 g 0  . TRADJENTA 5 MG TABS tablet TK 1 T PO QD    . triamcinolone cream (KENALOG) 0.5 % APPLY EXTERNALLY TO THE AFFECTED AREA TWICE DAILY FOR 7 DAYS    . vitamin B-12 (CYANOCOBALAMIN) 1000 MCG tablet      No facility-administered medications prior to visit.    Radiology:   Chest x-ray single view 04/25/2019: Mediastinum hilar structures normal. Cardiomegaly. Mild pulmonary venous congestion bilateral interstitial prominence. Findings suggest mild CHF.  Calcified pulmonary nodules noted consistent with old calcified granulomas. Small right pleural effusion cannot be Excluded.  Cardiac Studies:   *** EKG:     ***  EKG 04-11-2020: Sinus rhythm with first-degree AV block at rate of 80 bpm, left axis deviation, left anterior fascicular block.  Right bundle branch block.  Trifascicular block.  Assessment  No diagnosis found.   There are no discontinued medications.  No orders of the defined types were placed in this encounter.  No orders of the defined types were placed in this encounter.   Recommendations:   [name] is a 80 y.o. 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  ***    Adrian Prows, MD, [facility] 05/28/2020, 4:28 AM Office: [phone]

## 2020-06-01 NOTE — Progress Notes (Signed)
Primary Physician/Referring:  Terry Mccreedy, MD  Patient ID: [name], female    DOB: 1940-06-12, 80 y.o.   MRN: [id number]  Chief Complaint  Patient presents with  . Congestive Heart Failure  . Pulm HTN    Ref by [name] Number, PA   HPI:    [name]  is a 80 y.o. 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  She was evaluated by Dr. Quay Burow in November 2021 for evaluation of chronic leg edema which improved with furosemide and management of hypertension.  She had not had any cardiac work-up.  Patient states that she has had chronic leg swelling for many months.  Recently started on furosemide with mild improvement but still continues to have significant leg swelling and also states that she has been having leg cramps.  Activity is reduced due to leg swelling and also has noticed shortness of breath.  Denies PND or orthopnea.  No chest pain or palpitations.  Past Medical History:  Diagnosis Date  . Diabetes mellitus without complication ([place])   . Hypertension    Past Surgical History:  Procedure Laterality Date  . TONSILLECTOMY     History reviewed. No pertinent family history.  Social History   Tobacco Use  . Smoking status: Never Smoker  . Smokeless tobacco: Never Used  Substance Use Topics  . Alcohol use: No   Marital Status: Divorced  ROS  Review of Systems  Cardiovascular: Positive for dyspnea on exertion and leg swelling. Negative for chest pain.  Musculoskeletal: Positive for muscle cramps (legs).  Gastrointestinal: Negative for melena.   Objective  Blood pressure (!) 145/66, pulse 83, temperature 97.9 F (36.6 C), temperature source Temporal, resp. rate 16, height 5' 4" (1.626 m), weight 186 lb 12.8 oz (84.7 kg), SpO2 99 %.  Vitals with BMI  06/02/2020 06/02/2020 03/19/2020  Height - 5' 4" 5' 4"  Weight - 186 lbs 13 oz 167 lbs  BMI - 76.16 07.37  Systolic 106 269 485  Diastolic 66 65 64  Pulse 83 85 80     Physical Exam Constitutional:      Comments: Mildly obese in no acute distress.  Cardiovascular:     Rate and Rhythm: Normal rate and regular rhythm.     Pulses:          Carotid pulses are 2+ on the right side and 2+ on the left side.      Femoral pulses are 2+ on the right side and 2+ on the left side.      Dorsalis pedis pulses are 2+ on the right side and 1+ on the left side.       Posterior tibial pulses are 2+ on the right side and 0 on the left side.     Heart sounds: Normal heart sounds. No murmur heard. No gallop.      Comments: Popliteal pulse difficult to feel due to patient's body habitus.  There is nonpitting bilateral leg edema all the way up to the thigh, [place] appearance of the skin.  No JVD. Pulmonary:     Effort: Pulmonary effort is normal.     Breath sounds: Normal breath sounds.  Abdominal:     General: Bowel sounds are normal.     Palpations: Abdomen is soft.     Comments: Obese. Pannus present  Laboratory examination:   Recent Labs    08/09/19 1104  NA 135  K 4.8  CL 95*  CO2 26  GLUCOSE 405*  BUN 18  CREATININE 1.51*  CALCIUM 9.9  GFRNONAA 33*  GFRAA 38*   CrCl cannot be calculated (Patient's most recent lab result is older than the maximum 21 days allowed.).  CMP Latest Ref Rng & Units 08/09/2019 05/22/2019 04/25/2019  Glucose 65 - 99 mg/dL 405(H) 182(H) 185(H)  BUN 8 - 27 mg/dL 18 11 28(H)  Creatinine 0.57 - 1.00 mg/dL 1.51(H) 1.16(H) 1.36(H)  Sodium 134 - 144 mmol/L 135 141 135  Potassium 3.5 - 5.2 mmol/L 4.8 4.0 4.5  Chloride 96 - 106 mmol/L 95(L) 100 105  CO2 20 - 29 mmol/L _0 Calcium 8.7 - 10.3 mg/dL 9.9 9.7 8.8(L)   CBC Latest Ref Rng & Units 04/25/2019 05/23/2018  WBC 4.0 - 10.5 K/uL 4.8 4.4  Hemoglobin 12.0 - 15.0 g/dL 8.7(L) 11.6(L)  Hematocrit  36.0 - 46.0 % 27.9(L) 37.7  Platelets 150 - 400 K/uL 209 177    Lipid Panel No results for input(s): CHOL, TRIG, LDLCALC, VLDL, HDL, CHOLHDL, LDLDIRECT in the last 8760 hours.  HEMOGLOBIN A1C No results found for: HGBA1C, MPG TSH No results for input(s): TSH in the last 8760 hours.  External labs:   Labs 05/15/2020:  Serum glucose 184 mg, BUN 26, creatinine 1.60, EGFR 35 mL, sodium 137, potassium 4.3, CMP otherwise normal.  A1c 10.0%.  BNP 08/10/1975.  Total cholesterol 224, triglycerides 46, HDL 113, LDL 103.  Non-HDL cholesterol 111.  Labs 10/20/2016: TSH normal.  Medications and allergies  No Known Allergies   Outpatient Medications Prior to Visit  Medication Sig Dispense Refill  . Accu-Chek FastClix Lancets MISC See admin instructions.    . BD PEN NEEDLE NANO 2ND GEN 32G X 4 MM MISC 3 (three) times daily. as directed    . Blood Glucose Monitoring Suppl (ACCU-CHEK GUIDE ME) w/Device KIT See admin instructions.    . Calcium-Magnesium-Vitamin D [phone] MG-MG-UNIT TABS Take by mouth.    . Cholecalciferol (VITAMIN D3) 25 MCG (1000 UT) CAPS     . Coenzyme Q10 200 MG/GM POWD Take by mouth.    . furosemide (LASIX) 40 MG tablet Take 40 mg by mouth 2 (two) times daily.    Marland Kitchen gabapentin (NEURONTIN) 100 MG capsule TK 1 C PO BID    . glipiZIDE (GLUCOTROL) 10 MG tablet     . glucose blood test strip TEST BLOOD SUGARS 3 4 TIMES DAILY.    Marland Kitchen Homeopathic Products (LEG CRAMPS PM SL) Place under the tongue.    . insulin lispro (HUMALOG) 100 UNIT/ML KwikPen     . Magnesium 250 MG TABS Take by mouth.    . Misc Natural Products (LEG VEIN & CIRCULATION) TABS Take by mouth.    . NON FORMULARY 125 mg. [employer], womans vitamins    . pravastatin (PRAVACHOL) 20 MG tablet TAKE 1 TABLET BY MOUTH QD AT BEDTIME    . silver sulfADIAZINE (SILVADENE) 1 % cream Apply 1 application topically daily. 50 g 0  . TRADJENTA 5 MG TABS tablet TK 1 T PO QD    . triamcinolone cream (KENALOG)  0.5 % APPLY EXTERNALLY TO THE AFFECTED AREA TWICE DAILY FOR 7 DAYS    . vitamin B-12 (CYANOCOBALAMIN) 1000 MCG tablet     . amLODipine (NORVASC) 10 MG tablet TK 1 T PO QD    . furosemide (LASIX)  20 MG tablet Take 1.5 tablets (30 mg total) by mouth daily. (Patient taking differently: Take 40 mg by mouth 2 (two) times daily.) 180 tablet 3  . hydrALAZINE (APRESOLINE) 50 MG tablet in the morning and at bedtime.    . clopidogrel (PLAVIX) 75 MG tablet Take 75 mg by mouth daily.    Marland Kitchen glimepiride (AMARYL) 2 MG tablet Take 2 mg by mouth daily.    . metFORMIN (GLUCOPHAGE) 1000 MG tablet Take 1,000 mg by mouth 2 (two) times daily.     No facility-administered medications prior to visit.    Radiology:   Chest x-ray single view 04/25/2019: Mediastinum hilar structures normal. Cardiomegaly. Mild pulmonary venous congestion bilateral interstitial prominence. Findings suggest mild CHF.  Calcified pulmonary nodules noted consistent with old calcified granulomas. Small right pleural effusion cannot be Excluded.  Cardiac Studies:   None EKG:      EKG 03/19/2020: Sinus rhythm with first-degree AV block at rate of 80 bpm, left axis deviation, left anterior fascicular block.  Right bundle branch block.  Trifascicular block.  Assessment     ICD-10-CM   1. Dyspnea on exertion  R06.00 EKG 12-Lead    PCV ECHOCARDIOGRAM COMPLETE    PCV MYOCARDIAL PERFUSION WITH LEXISCAN  2. Bilateral leg edema  R60.0 Ambulatory referral to Vascular Surgery    Compression stockings  3. 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 Primary hypertension  I10 hydrALAZINE (APRESOLINE) 50 MG tablet    isosorbide dinitrate (ISORDIL) 30 MG tablet  6. Type 2 diabetes mellitus with stage 3b chronic kidney disease, with long-term current use of insulin ([facility])  E11.22 PCV MYOCARDIAL PERFUSION WITH LEXISCAN   N18.32    Z79.4   7.  Bilateral leg cramps  R25.2 PCV ANKLE BRACHIAL INDEX (ABI)     Medications Discontinued During This Encounter  Medication Reason  . glimepiride (AMARYL) 2 MG tablet Change in therapy  . amLODipine (NORVASC) 10 MG tablet Discontinued by provider  . clopidogrel (PLAVIX) 75 MG tablet Error  . metFORMIN (GLUCOPHAGE) 1000 MG tablet Error  . furosemide (LASIX) 20 MG tablet Error  . hydrALAZINE (APRESOLINE) 50 MG tablet Reorder    Meds ordered this encounter  Medications  . hydrALAZINE (APRESOLINE) 50 MG tablet    Sig: Take 1 tablet (50 mg total) by mouth 3 (three) times daily.    Dispense:  90 tablet    Refill:  2  . isosorbide dinitrate (ISORDIL) 30 MG tablet    Sig: Take 1 tablet (30 mg total) by mouth 3 (three) times daily.    Dispense:  90 tablet    Refill:  2    Discontinue amlodipine   Orders Placed This Encounter  Procedures  . Compression stockings  . Ambulatory referral to Vascular Surgery    Referral Priority:   Routine    Referral Type:   Surgical    Referral Reason:   Specialty Services Required    Requested Specialty:   Vascular Surgery    Number of Visits Requested:   1  . PCV MYOCARDIAL PERFUSION WITH LEXISCAN    Standing Status:   Future    Standing Expiration Date:   07/31/2020  . EKG 12-Lead  . PCV ECHOCARDIOGRAM COMPLETE    Standing Status:   Future    Standing Expiration Date:   06/02/2021      Recommendations:   Zoya Sprecher is a 80 y.o.  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  Extremely complex patient with multiple medical comorbidity.  Recently started on furosemide and she has had partial response and has started to feel better but still has significant amount of bilateral leg edema which appears to be mostly nonpitting.  She may have chronic venous  insufficiency versus lymphedema.  I would like to obtain a consultation from vascular surgery.  I have prescribed her support stockings thigh-high.  In view of significant leg edema, I would like to manage hypertension by discontinuing amlodipine, increase hydralazine from twice daily to 3 times daily dosing of 50 mg, patient has been taking twice daily dosing Streb 3 times daily and I will add isosorbide dinitrate 30 mg 3 times daily.  Patient has underlying trifascicular block, presently 80 years of age hence do not want to use a beta-blocker.  Risk of complete heart block.  With regard to dyspnea on exertion, suspect diastolic dysfunction.  Underlying coronary artery disease need to be excluded. Will schedule for an echocardiogram. Schedule for a Lexiscan Nuclear stress test to evaluate for myocardial ischemia. Patient unable to do treadmill stress testing due to dyspnea. Office visit following the work-up/investigations.    I reviewed her external labs, diabetes continues to be uncontrolled and has underlying stage III chronic kidney disease.  Lipids are not well controlled.  On her next office visit I will consider switching her from pravastatin to Lipitor.  This was a 60-minute encounter with evaluation of complex medical issues, coordination of care and evaluation of external records.   Adrian Prows, MD, [facility] 06/02/2020, 1:24 PM Office: [phone]

## 2020-06-02 ENCOUNTER — Ambulatory Visit: Payer: Medicare Other | Admitting: Cardiology

## 2020-06-02 ENCOUNTER — Encounter: Payer: Self-pay | Admitting: Cardiology

## 2020-06-02 ENCOUNTER — Other Ambulatory Visit: Payer: Self-pay

## 2020-06-02 VITALS — BP 145/66 | HR 83 | Temp 97.9°F | Resp 16 | Ht 64.0 in | Wt 186.8 lb

## 2020-06-02 DIAGNOSIS — R0609 Other forms of dyspnea: Secondary | ICD-10-CM | POA: Diagnosis not present

## 2020-06-02 DIAGNOSIS — R252 Cramp and spasm: Secondary | ICD-10-CM | POA: Diagnosis not present

## 2020-06-02 DIAGNOSIS — I10 Essential (primary) hypertension: Secondary | ICD-10-CM | POA: Diagnosis not present

## 2020-06-02 DIAGNOSIS — E1122 Type 2 diabetes mellitus with diabetic chronic kidney disease: Secondary | ICD-10-CM

## 2020-06-02 DIAGNOSIS — R600 Localized edema: Secondary | ICD-10-CM

## 2020-06-02 DIAGNOSIS — N1832 Chronic kidney disease, stage 3b: Secondary | ICD-10-CM | POA: Diagnosis not present

## 2020-06-02 DIAGNOSIS — Z794 Long term (current) use of insulin: Secondary | ICD-10-CM

## 2020-06-02 DIAGNOSIS — I453 Trifascicular block: Secondary | ICD-10-CM

## 2020-06-02 DIAGNOSIS — R0600 Dyspnea, unspecified: Secondary | ICD-10-CM

## 2020-06-02 DIAGNOSIS — I878 Other specified disorders of veins: Secondary | ICD-10-CM

## 2020-06-02 MED ORDER — HYDRALAZINE HCL 50 MG PO TABS: 50.0000 mg | ORAL_TABLET | Freq: Three times a day (TID) | ORAL | 2 refills | Status: DC

## 2020-06-02 MED ORDER — ISOSORBIDE DINITRATE 30 MG PO TABS: 30.0000 mg | ORAL_TABLET | Freq: Three times a day (TID) | ORAL | 2 refills | Status: DC

## 2020-06-05 ENCOUNTER — Telehealth: Payer: Self-pay

## 2020-06-05 NOTE — Telephone Encounter (Signed)
Increased Hydralazine 50 mg to TID from BID and sent in for Isosorbide dinitrate 30 mg TID

## 2020-06-05 NOTE — Telephone Encounter (Signed)
Pt called stating she was here on 06/02/2020 and she never got her medication for her legs from her pharmacy. I have updated her pharmacy in her chart, I just am not sure which medications were ordered.

## 2020-06-06 ENCOUNTER — Other Ambulatory Visit: Payer: Self-pay

## 2020-06-06 DIAGNOSIS — I10 Essential (primary) hypertension: Secondary | ICD-10-CM

## 2020-06-06 MED ORDER — ISOSORBIDE DINITRATE 30 MG PO TABS: 30.0000 mg | ORAL_TABLET | Freq: Three times a day (TID) | ORAL | 2 refills | Status: DC

## 2020-06-06 MED ORDER — HYDRALAZINE HCL 50 MG PO TABS: 50.0000 mg | ORAL_TABLET | Freq: Three times a day (TID) | ORAL | 2 refills | Status: DC

## 2020-06-06 NOTE — Telephone Encounter (Signed)
Called and spoke with pt, pt voiced understanding. Refills have been sent to her new pharmacy.

## 2020-06-09 ENCOUNTER — Other Ambulatory Visit: Payer: Self-pay

## 2020-06-09 MED ORDER — TRIAMCINOLONE ACETONIDE 0.5 % EX CREA: TOPICAL_CREAM | Freq: Two times a day (BID) | CUTANEOUS | 1 refills | Status: DC

## 2020-06-10 DIAGNOSIS — I10 Essential (primary) hypertension: Secondary | ICD-10-CM | POA: Diagnosis not present

## 2020-06-10 DIAGNOSIS — E1142 Type 2 diabetes mellitus with diabetic polyneuropathy: Secondary | ICD-10-CM | POA: Diagnosis not present

## 2020-06-10 DIAGNOSIS — E1165 Type 2 diabetes mellitus with hyperglycemia: Secondary | ICD-10-CM | POA: Diagnosis not present

## 2020-06-10 DIAGNOSIS — E559 Vitamin D deficiency, unspecified: Secondary | ICD-10-CM | POA: Diagnosis not present

## 2020-06-10 DIAGNOSIS — N1832 Chronic kidney disease, stage 3b: Secondary | ICD-10-CM | POA: Diagnosis not present

## 2020-06-10 DIAGNOSIS — E7800 Pure hypercholesterolemia, unspecified: Secondary | ICD-10-CM | POA: Diagnosis not present

## 2020-06-10 DIAGNOSIS — I872 Venous insufficiency (chronic) (peripheral): Secondary | ICD-10-CM | POA: Diagnosis not present

## 2020-06-10 DIAGNOSIS — I509 Heart failure, unspecified: Secondary | ICD-10-CM | POA: Diagnosis not present

## 2020-06-10 DIAGNOSIS — I119 Hypertensive heart disease without heart failure: Secondary | ICD-10-CM | POA: Diagnosis not present

## 2020-06-11 ENCOUNTER — Other Ambulatory Visit: Payer: Self-pay

## 2020-06-11 ENCOUNTER — Ambulatory Visit: Payer: Medicare Other

## 2020-06-11 DIAGNOSIS — R0609 Other forms of dyspnea: Secondary | ICD-10-CM

## 2020-06-11 DIAGNOSIS — N1832 Chronic kidney disease, stage 3b: Secondary | ICD-10-CM | POA: Diagnosis not present

## 2020-06-11 DIAGNOSIS — E1122 Type 2 diabetes mellitus with diabetic chronic kidney disease: Secondary | ICD-10-CM | POA: Diagnosis not present

## 2020-06-11 DIAGNOSIS — R0600 Dyspnea, unspecified: Secondary | ICD-10-CM

## 2020-06-11 DIAGNOSIS — Z794 Long term (current) use of insulin: Secondary | ICD-10-CM | POA: Diagnosis not present

## 2020-06-16 NOTE — Progress Notes (Signed)
Called and spoke with pt regarding stress test result. Pt voiced understanding.

## 2020-06-18 ENCOUNTER — Other Ambulatory Visit: Payer: Self-pay

## 2020-06-18 DIAGNOSIS — N183 Chronic kidney disease, stage 3 unspecified: Secondary | ICD-10-CM | POA: Insufficient documentation

## 2020-06-18 DIAGNOSIS — E7800 Pure hypercholesterolemia, unspecified: Secondary | ICD-10-CM | POA: Insufficient documentation

## 2020-06-18 DIAGNOSIS — I872 Venous insufficiency (chronic) (peripheral): Secondary | ICD-10-CM | POA: Insufficient documentation

## 2020-06-18 DIAGNOSIS — E119 Type 2 diabetes mellitus without complications: Secondary | ICD-10-CM | POA: Insufficient documentation

## 2020-06-18 DIAGNOSIS — E1165 Type 2 diabetes mellitus with hyperglycemia: Secondary | ICD-10-CM | POA: Insufficient documentation

## 2020-06-18 DIAGNOSIS — N3946 Mixed incontinence: Secondary | ICD-10-CM | POA: Insufficient documentation

## 2020-06-18 DIAGNOSIS — I119 Hypertensive heart disease without heart failure: Secondary | ICD-10-CM | POA: Insufficient documentation

## 2020-06-18 DIAGNOSIS — E1142 Type 2 diabetes mellitus with diabetic polyneuropathy: Secondary | ICD-10-CM | POA: Insufficient documentation

## 2020-06-18 DIAGNOSIS — I509 Heart failure, unspecified: Secondary | ICD-10-CM | POA: Insufficient documentation

## 2020-06-18 DIAGNOSIS — E559 Vitamin D deficiency, unspecified: Secondary | ICD-10-CM | POA: Insufficient documentation

## 2020-06-18 DIAGNOSIS — R269 Unspecified abnormalities of gait and mobility: Secondary | ICD-10-CM | POA: Insufficient documentation

## 2020-06-19 ENCOUNTER — Other Ambulatory Visit: Payer: Self-pay

## 2020-06-19 ENCOUNTER — Ambulatory Visit: Payer: Medicare Other

## 2020-06-19 DIAGNOSIS — R0600 Dyspnea, unspecified: Secondary | ICD-10-CM

## 2020-06-19 DIAGNOSIS — R0609 Other forms of dyspnea: Secondary | ICD-10-CM | POA: Diagnosis not present

## 2020-06-19 DIAGNOSIS — R252 Cramp and spasm: Secondary | ICD-10-CM | POA: Diagnosis not present

## 2020-06-22 NOTE — Progress Notes (Signed)
She was supposed to see me after the tests. Please schedule her in next 2 weeks. I will discuss tests on visit

## 2020-06-24 NOTE — Progress Notes (Signed)
Please call patient to discuss test results. Thank you.

## 2020-06-25 DIAGNOSIS — N1832 Chronic kidney disease, stage 3b: Secondary | ICD-10-CM | POA: Diagnosis not present

## 2020-06-25 DIAGNOSIS — E1165 Type 2 diabetes mellitus with hyperglycemia: Secondary | ICD-10-CM | POA: Diagnosis not present

## 2020-06-25 DIAGNOSIS — E7800 Pure hypercholesterolemia, unspecified: Secondary | ICD-10-CM | POA: Diagnosis not present

## 2020-06-25 DIAGNOSIS — I10 Essential (primary) hypertension: Secondary | ICD-10-CM | POA: Diagnosis not present

## 2020-06-25 DIAGNOSIS — E559 Vitamin D deficiency, unspecified: Secondary | ICD-10-CM | POA: Diagnosis not present

## 2020-06-25 DIAGNOSIS — I872 Venous insufficiency (chronic) (peripheral): Secondary | ICD-10-CM | POA: Diagnosis not present

## 2020-06-25 DIAGNOSIS — I119 Hypertensive heart disease without heart failure: Secondary | ICD-10-CM | POA: Diagnosis not present

## 2020-06-25 DIAGNOSIS — M25512 Pain in left shoulder: Secondary | ICD-10-CM | POA: Diagnosis not present

## 2020-06-25 DIAGNOSIS — E1142 Type 2 diabetes mellitus with diabetic polyneuropathy: Secondary | ICD-10-CM | POA: Diagnosis not present

## 2020-06-25 DIAGNOSIS — I509 Heart failure, unspecified: Secondary | ICD-10-CM | POA: Diagnosis not present

## 2020-07-07 ENCOUNTER — Encounter: Payer: Medicare Other | Admitting: Vascular Surgery

## 2020-07-07 ENCOUNTER — Ambulatory Visit (HOSPITAL_COMMUNITY): Payer: Medicare Other | Attending: Vascular Surgery

## 2020-07-10 ENCOUNTER — Ambulatory Visit: Payer: Medicare Other | Admitting: Cardiology

## 2020-07-14 ENCOUNTER — Ambulatory Visit: Payer: Medicare Other | Admitting: Student

## 2020-07-16 NOTE — Progress Notes (Signed)
Primary Physician/Referring:  Benito Mccreedy, MD  Patient ID: [name], female    DOB: [birth year], 80 y.o.   MRN: [id number]  Chief Complaint  Patient presents with  . Shortness of Breath  . Results  . Follow-up  . Arm tingling   HPI:    [name]  is a 80 y.o. 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  Patient presents for 2 week follow up of hypertension, shortness of breath, and bilateral lower leg edema. At last visit stopped amlodipine, increased hydralazine to 50 mg TID and added isosorbide dinitrate TID.  However patient only increased hydralazine has not taking isosorbide dinitrate.  Patient reports she never started isosorbide dinitrate, as the pharmacy did not call her to let her know she needs to pick it up.  Also referred patient to vascular surgery for evaluation of lymphedema vs venous insufficiency. Also at last visit prescribed thigh-high support stockings, which she states she is not currently wearing on a regular basis.  Patient reports shortness of breath and swelling have both improved significantly since last visit.  Patient denies chest pain, palpitations, syncope, near syncope.  Denies orthopnea, PND.  Past Medical History:  Diagnosis Date  . Diabetes mellitus without complication ([place])   . Hypertension    Past Surgical History:  Procedure Laterality Date  . TONSILLECTOMY     History reviewed. No pertinent family history.  Social History   Tobacco Use  . Smoking status: Never Smoker  . Smokeless tobacco: Never Used  Substance Use Topics  . Alcohol use: No   Marital Status: Divorced  ROS  Review of Systems  Constitutional: Negative for malaise/fatigue.  Cardiovascular: Positive for dyspnea on exertion (imrpoved) and leg swelling (improved).  Negative for chest pain, claudication, near-syncope, orthopnea, palpitations, paroxysmal nocturnal dyspnea and syncope.  Respiratory: Negative for shortness of breath.   Hematologic/Lymphatic: Does not bruise/bleed easily.  Musculoskeletal: Positive for muscle cramps (legs).  Gastrointestinal: Negative for melena.  Neurological: Negative for dizziness and weakness.   Objective  Blood pressure 137/75, pulse 84, temperature (!) 97.3 F (36.3 C), height '5\' 4"'  (1.626 m), weight 158 lb (71.7 kg).  Vitals with BMI 07/17/2020 06/02/2020 06/02/2020  Height '5\' 4"'  - '5\' 4"'   Weight 158 lbs - 186 lbs 13 oz  BMI 53.00 - 51.10  Systolic 211 173 567  Diastolic 75 66 65  Pulse 84 83 85     Physical Exam Constitutional:      Comments: Mildly obese in no acute distress.  Cardiovascular:     Rate and Rhythm: Normal rate and regular rhythm.     Pulses:          Carotid pulses are 2+ on the right side and 2+ on the left side.      Femoral pulses are 2+ on the right side and 2+ on the left side.      Dorsalis pedis pulses are 2+ on the right side and 1+ on the left side.       Posterior tibial pulses are 2+ on the right side and 0 on the left side.     Heart sounds: Normal heart sounds. No murmur heard. No gallop.      Comments: Popliteal pulse difficult to feel due to patient's body habitus.  There is nonpitting bilateral leg edema all the way up to the  thigh, [place] appearance of the skin.  No JVD. Pulmonary:     Effort: Pulmonary effort is normal.     Breath sounds: Normal breath sounds.  Abdominal:     General: Bowel sounds are normal.     Palpations: Abdomen is soft.     Comments: Obese. Pannus present  Skin:    General: Skin is warm and dry.    Laboratory examination:   Recent Labs    08/09/19 1104  NA 135  K 4.8  CL 95*  CO2 26  GLUCOSE 405*  BUN 18  CREATININE 1.51*  CALCIUM 9.9  GFRNONAA 33*  GFRAA 38*   CrCl cannot be calculated (Patient's most recent lab result is  older than the maximum 21 days allowed.).  CMP Latest Ref Rng & Units 08/09/2019 05/22/2019 04/25/2019  Glucose 65 - 99 mg/dL 405(H) 182(H) 185(H)  BUN 8 - 27 mg/dL 18 11 28(H)  Creatinine 0.57 - 1.00 mg/dL 1.51(H) 1.16(H) 1.36(H)  Sodium 134 - 144 mmol/L 135 141 135  Potassium 3.5 - 5.2 mmol/L 4.8 4.0 4.5  Chloride 96 - 106 mmol/L 95(L) 100 105  CO2 20 - 29 mmol/L '26 28 22  ' Calcium 8.7 - 10.3 mg/dL 9.9 9.7 8.8(L)   CBC Latest Ref Rng & Units 04/25/2019 05/23/2018  WBC 4.0 - 10.5 K/uL 4.8 4.4  Hemoglobin 12.0 - 15.0 g/dL 8.7(L) 11.6(L)  Hematocrit 36.0 - 46.0 % 27.9(L) 37.7  Platelets 150 - 400 K/uL 209 177    Lipid Panel No results for input(s): CHOL, TRIG, LDLCALC, VLDL, HDL, CHOLHDL, LDLDIRECT in the last 8760 hours.  HEMOGLOBIN A1C No results found for: HGBA1C, MPG TSH No results for input(s): TSH in the last 8760 hours.  External labs:   Labs 05/15/2020:  Serum glucose 184 mg, BUN 26, creatinine 1.60, EGFR 35 mL, sodium 137, potassium 4.3, CMP otherwise normal.  A1c 10.0%.  BNP 08/10/1975.  Total cholesterol 224, triglycerides 46, HDL 113, LDL 103.  Non-HDL cholesterol 111.  Labs 10/20/2016: TSH normal.  Medications and allergies  No Known Allergies   Outpatient Medications Prior to Visit  Medication Sig Dispense Refill  . Accu-Chek FastClix Lancets MISC See admin instructions.    . BD PEN NEEDLE NANO 2ND GEN 32G X 4 MM MISC 3 (three) times daily. as directed    . Blood Glucose Monitoring Suppl (ACCU-CHEK GUIDE ME) w/Device KIT See admin instructions.    . Calcium-Magnesium-Vitamin D [phone] MG-MG-UNIT TABS Take by mouth.    . Coenzyme Q10 200 MG/GM POWD Take by mouth.    . furosemide (LASIX) 40 MG tablet Take 40 mg by mouth in the morning. With additional 40 mg as needed for fluid overload     . gabapentin (NEURONTIN) 100 MG capsule TK 1 C PO BID    . glipiZIDE (GLUCOTROL) 10 MG tablet     . glucose blood test strip TEST BLOOD SUGARS 3 4 TIMES DAILY.    Marland Kitchen  Homeopathic Products (LEG CRAMPS PM SL) Place under the tongue.    . insulin lispro (HUMALOG) 100 UNIT/ML KwikPen Inject 10 Units into the skin at bedtime.    . Magnesium 250 MG TABS Take by mouth.    . Misc Natural Products (LEG VEIN & CIRCULATION) TABS Take by mouth.    . NON FORMULARY 125 mg. [employer], womans vitamins    . pravastatin (PRAVACHOL) 20 MG tablet TAKE 1 TABLET BY MOUTH QD AT BEDTIME    . silver sulfADIAZINE (SILVADENE) 1 %  cream Apply 1 application topically daily. 50 g 0  . TRADJENTA 5 MG TABS tablet TK 1 T PO QD    . Cholecalciferol (VITAMIN D3) 25 MCG (1000 UT) CAPS  (Patient not taking: Reported on 07/17/2020)    . hydrALAZINE (APRESOLINE) 50 MG tablet Take 1 tablet (50 mg total) by mouth 3 (three) times daily. 90 tablet 2  . triamcinolone cream (KENALOG) 0.5 % Apply topically 2 (two) times daily. (Patient not taking: Reported on 07/17/2020) 30 g 1  . vitamin B-12 (CYANOCOBALAMIN) 1000 MCG tablet  (Patient not taking: Reported on 07/17/2020)    . isosorbide dinitrate (ISORDIL) 30 MG tablet Take 1 tablet (30 mg total) by mouth 3 (three) times daily. (Patient not taking: Reported on 07/17/2020) 90 tablet 2   No facility-administered medications prior to visit.    Radiology:   Chest x-ray single view 04/25/2019: Mediastinum hilar structures normal. Cardiomegaly. Mild pulmonary venous congestion bilateral interstitial prominence. Findings suggest mild CHF.  Calcified pulmonary nodules noted consistent with old calcified granulomas. Small right pleural effusion cannot be Excluded.  Cardiac Studies:   ABI 06/19/2020:  This exam reveals normal perfusion of the right and left lower extremity  (ABI 1.00). Mildly abnormal biphasic waveform noted at the ankles. There  is mild plaque evident in the lower extremity vessels at the ankles.  PCV ECHOCARDIOGRAM COMPLETE 06/19/2020 Left ventricle cavity is normal in size and wall thickness. Normal global wall motion.  Normal LV systolic function with EF 55%. Doppler evidence of grade II (pseudonormal) diastolic dysfunction, elevated LAP. Calculated EF 55%. Left atrial cavity is mildly dilated. Right atrial cavity is moderately dilated. Right ventricle cavity is moderately dilated. Moderately reduced right ventricular function. Trileaflet aortic valve with mild aortic valve leaflet calcification. Trace aortic stenosis. Mild (Grade I) aortic regurgitation. Moderate (Grade III) mitral regurgitation. Severe tricuspid regurgitation. Moderate pulmonary hypertension. Estimated pulmonary artery systolic pressure 52 mmHg.   PCV MYOCARDIAL PERFUSION WITH LEXISCAN 06/11/2020 Nondiagnostic ECG stress. Perfusion imaging study demonstrates decreased uptake in the inferior wall suggestive of soft tissue attenuation.  Ischemia in this region cannot be completely excluded. Overall LV systolic function is normal without regional wall motion abnormalities. Stress LV EF: 82%. Low risk.   EKG:   EKG 03/19/2020: Sinus rhythm with first-degree AV block at rate of 80 bpm, left axis deviation, left anterior fascicular block.  Right bundle branch block.  Trifascicular block.  Assessment     ICD-10-CM   1. Primary hypertension  I10 isosorbide dinitrate (ISORDIL) 30 MG tablet    Basic metabolic panel  2. Dyspnea on exertion  R06.00   3. 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 isosorbide dinitrate (ISORDIL) 30 MG tablet Reorder    Meds ordered this encounter  Medications  . isosorbide dinitrate (ISORDIL) 30 MG tablet    Sig: Take 1 tablet (30 mg total) by mouth 3 (three) times daily.    Dispense:  90 tablet    Refill:  2    Discontinue amlodipine   Recommendations:   Rusti Arizmendi is a 80 y.o. African-American female with history of hypertension, uncontrolled diabetes mellitus with stage  IIIb chronic kidney disease, hyperlipidemia, who has been referred to me for evaluation of pulmonary hypertension, leg edema and dyspnea on exertion.  Patient presents for 2 week follow up.  Patient symptoms of dyspnea on exertion  as well as leg edema have both significantly improved since last visit.  Reviewed and discussed with patient regarding results of ABI, stress test, and echocardiogram, details above.  Ankle-brachial index revealed normal perfusion of bilateral lower extremities with mild plaque evident at the ankles.  Echocardiogram revealed normal LVEF at 89%, grade 2 diastolic dysfunction, dilated LA and RA as well as moderate mitral regurgitation, severe tricuspid regurgitation and moderate pulmonary hypertension, likely group 2.  Nuclear stress test was low risk with normal LV systolic function and no regional wall motion abnormalities, however ischemia in the inferior wall cannot be excluded.  Patient verbalized understanding of results, and her questions were addressed.  Suspect underlying diastolic dysfunction to be primary etiology of pulmonary hypertension and therefore patient symptoms of dyspnea on exertion.  Patient's blood pressure remains uncontrolled, advised her to start isosorbide dinitrate 30 mg 3 times daily.  She would also likely benefit from addition of spironolactone in the future. In view of underlying bifascicular block and patient's advanced age of 33 years, will avoid use of beta-blocker therapy.  Patient will monitor blood pressure on a daily basis at home and bring with her written log to her next visit.  Follow up in 3 weeks, sooner if needed, for hypertension and diastolic heart failure.    Alethia Berthold, PA-C 07/20/2020, 2:28 PM Office: [phone]

## 2020-07-17 ENCOUNTER — Encounter: Payer: Self-pay | Admitting: Student

## 2020-07-17 ENCOUNTER — Ambulatory Visit: Payer: Medicare Other | Admitting: Student

## 2020-07-17 ENCOUNTER — Other Ambulatory Visit: Payer: Self-pay

## 2020-07-17 ENCOUNTER — Other Ambulatory Visit: Payer: Self-pay | Admitting: *Deleted

## 2020-07-17 VITALS — BP 137/75 | HR 84 | Temp 97.3°F | Ht 64.0 in | Wt 158.0 lb

## 2020-07-17 DIAGNOSIS — I453 Trifascicular block: Secondary | ICD-10-CM

## 2020-07-17 DIAGNOSIS — I10 Essential (primary) hypertension: Secondary | ICD-10-CM

## 2020-07-17 DIAGNOSIS — R0609 Other forms of dyspnea: Secondary | ICD-10-CM

## 2020-07-17 DIAGNOSIS — I872 Venous insufficiency (chronic) (peripheral): Secondary | ICD-10-CM

## 2020-07-17 DIAGNOSIS — R600 Localized edema: Secondary | ICD-10-CM | POA: Diagnosis not present

## 2020-07-17 DIAGNOSIS — E782 Mixed hyperlipidemia: Secondary | ICD-10-CM | POA: Diagnosis not present

## 2020-07-17 DIAGNOSIS — R0600 Dyspnea, unspecified: Secondary | ICD-10-CM

## 2020-07-17 MED ORDER — ISOSORBIDE DINITRATE 30 MG PO TABS: 30.0000 mg | ORAL_TABLET | Freq: Three times a day (TID) | ORAL | 2 refills | Status: DC

## 2020-07-21 ENCOUNTER — Ambulatory Visit (HOSPITAL_COMMUNITY): Admission: RE | Admit: 2020-07-21 | Payer: Medicare Other | Source: Ambulatory Visit

## 2020-07-21 DIAGNOSIS — I10 Essential (primary) hypertension: Secondary | ICD-10-CM | POA: Diagnosis not present

## 2020-07-22 ENCOUNTER — Telehealth: Payer: Self-pay

## 2020-07-22 LAB — BASIC METABOLIC PANEL
BUN/Creatinine Ratio: 15 (ref 12–28)
BUN: 22 mg/dL (ref 8–27)
CO2: 24 mmol/L (ref 20–29)
Calcium: 9.6 mg/dL (ref 8.7–10.3)
Chloride: 95 mmol/L — ABNORMAL LOW (ref 96–106)
Creatinine, Ser: 1.42 mg/dL — ABNORMAL HIGH (ref 0.57–1.00)
Glucose: 412 mg/dL — ABNORMAL HIGH (ref 65–99)
Potassium: 4.1 mmol/L (ref 3.5–5.2)
Sodium: 137 mmol/L (ref 134–144)
eGFR: 38 mL/min/{1.73_m2} — ABNORMAL LOW (ref >59)

## 2020-07-22 NOTE — Progress Notes (Signed)
Please inform patient her kidney function and electrolytes are stable.

## 2020-07-22 NOTE — Progress Notes (Signed)
Called and spoke to pt regarding lab results. Pt voiced understanding.

## 2020-07-22 NOTE — Telephone Encounter (Signed)
-----   Message from Alethia Berthold, [place] sent at 07/22/2020 10:47 AM EDT ----- Please inform patient her kidney function and electrolytes are stable.

## 2020-07-23 ENCOUNTER — Encounter (HOSPITAL_COMMUNITY): Payer: Medicare Other

## 2020-08-07 ENCOUNTER — Other Ambulatory Visit: Payer: Self-pay

## 2020-08-07 ENCOUNTER — Telehealth: Payer: Self-pay

## 2020-08-07 ENCOUNTER — Encounter: Payer: Self-pay | Admitting: Student

## 2020-08-07 ENCOUNTER — Other Ambulatory Visit: Payer: Self-pay | Admitting: Cardiovascular Disease

## 2020-08-07 ENCOUNTER — Ambulatory Visit: Payer: Medicare Other | Admitting: Student

## 2020-08-07 VITALS — BP 206/93 | HR 82 | Temp 98.4°F | Ht 64.0 in | Wt 159.0 lb

## 2020-08-07 DIAGNOSIS — I453 Trifascicular block: Secondary | ICD-10-CM

## 2020-08-07 DIAGNOSIS — R0600 Dyspnea, unspecified: Secondary | ICD-10-CM

## 2020-08-07 DIAGNOSIS — R0609 Other forms of dyspnea: Secondary | ICD-10-CM

## 2020-08-07 DIAGNOSIS — I10 Essential (primary) hypertension: Secondary | ICD-10-CM

## 2020-08-07 DIAGNOSIS — R600 Localized edema: Secondary | ICD-10-CM

## 2020-08-07 MED ORDER — ISOSORBIDE DINITRATE 40 MG PO TABS: 40.0000 mg | ORAL_TABLET | Freq: Three times a day (TID) | ORAL | 3 refills | Status: DC

## 2020-08-07 MED ORDER — OLMESARTAN MEDOXOMIL 20 MG PO TABS: 20.0000 mg | ORAL_TABLET | Freq: Every day | ORAL | 3 refills | Status: DC

## 2020-08-07 NOTE — Progress Notes (Signed)
Primary Physician/Referring:  Benito Mccreedy, MD  Patient ID: [name], female    DOB: Nov 08, 1940, 80 y.o.   MRN: [id number]  Chief Complaint  Patient presents with  . Hypertension  . diastolic heart failure   HPI:    [name]  is a 80 y.o. 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  Patient presents for 3-week follow-up of hypertension and diastolic heart failure.  At last visit started isosorbide dinitrate 30 mg 3 times daily.  Patient reports she is feeling well overall, particularly compared to last visit.  Denies chest pain, palpitations, syncope, near syncope, orthopnea, PND.  Patient is not regularly wearing support stockings.  However bilateral lower leg edema has resolved since last visit.  Patient's renal function has remained stable.  Patient reports medication compliance, however upon further questioning it is unclear whether she may be missing doses of medications or taking them properly.  Past Medical History:  Diagnosis Date  . Diabetes mellitus without complication ([place])   . Hypertension    Past Surgical History:  Procedure Laterality Date  . TONSILLECTOMY     History reviewed. No pertinent family history.  No history of premature coronary artery disease or sudden cardiac death in her family. Social History   Tobacco Use  . Smoking status: Never Smoker  . Smokeless tobacco: Never Used  Substance Use Topics  . Alcohol use: No   Marital Status: Divorced  ROS  Review of Systems  Constitutional: Negative for malaise/fatigue and weight gain.  Eyes: Negative for visual disturbance.  Cardiovascular: Positive for dyspnea on exertion (imrpoved) and leg swelling (Resolved). Negative for chest pain, claudication, near-syncope, orthopnea, palpitations,  paroxysmal nocturnal dyspnea and syncope.  Respiratory: Negative for shortness of breath.   Hematologic/Lymphatic: Does not bruise/bleed easily.  Musculoskeletal: Positive for muscle cramps (legs).  Gastrointestinal: Negative for melena.  Neurological: Negative for dizziness, headaches, light-headedness and weakness.   Objective  Blood pressure (!) 206/93, pulse 82, temperature 98.4 F (36.9 C), height _0  (1.626 m), weight 159 lb (72.1 kg), SpO2 96 %.  Vitals with BMI 08/07/2020 08/07/2020 07/17/2020  Height - _1  _2   Weight - 159 lbs 158 lbs  BMI - 75.30 05.11  Systolic 021 117 356  Diastolic 93 90 75  Pulse 82 85 84     Physical Exam Constitutional:      Comments: Mildly obese in no acute distress.  Cardiovascular:     Rate and Rhythm: Normal rate and regular rhythm.     Pulses:          Carotid pulses are 2+ on the right side and 2+ on the left side.      Femoral pulses are 2+ on the right side and 2+ on the left side.      Dorsalis pedis pulses are 2+ on the right side and 1+ on the left side.       Posterior tibial pulses are 2+ on the right side and 0 on the left side.     Heart sounds: Normal heart sounds. No murmur heard. No gallop.      Comments: Popliteal pulse difficult to feel due to patient's body habitus.  Trace bilateral edema. No JVD.   Pulmonary:     Effort: Pulmonary effort is normal.     Breath sounds: Normal breath sounds.  Abdominal:  Comments: Obese. Pannus present  Skin:    General: Skin is warm and dry.  Neurological:     General: No focal deficit present.     Mental Status: She is alert and oriented to person, place, and time.     Cranial Nerves: No cranial nerve deficit.     Motor: No weakness.    Laboratory examination:   Recent Labs    08/09/19 1104 07/21/20 1321  NA 135 137  K 4.8 4.1  CL 95* 95*  CO2 26 24  GLUCOSE 405* 412*  BUN 18 22  CREATININE 1.51* 1.42*  CALCIUM 9.9 9.6  GFRNONAA 33*  --   GFRAA 38*  --     estimated creatinine clearance is 31.8 mL/min (A) (by C-G formula based on SCr of 1.42 mg/dL (H)).  CMP Latest Ref Rng & Units 07/21/2020 08/09/2019 05/22/2019  Glucose 65 - 99 mg/dL 412(H) 405(H) 182(H)  BUN 8 - 27 mg/dL _0 Creatinine 0.57 - 1.00 mg/dL 1.42(H) 1.51(H) 1.16(H)  Sodium 134 - 144 mmol/L 137 135 141  Potassium 3.5 - 5.2 mmol/L 4.1 4.8 4.0  Chloride 96 - 106 mmol/L 95(L) 95(L) 100  CO2 20 - 29 mmol/L _1 Calcium 8.7 - 10.3 mg/dL 9.6 9.9 9.7   CBC Latest Ref Rng & Units 04/25/2019 05/23/2018  WBC 4.0 - 10.5 K/uL 4.8 4.4  Hemoglobin 12.0 - 15.0 g/dL 8.7(L) 11.6(L)  Hematocrit 36.0 - 46.0 % 27.9(L) 37.7  Platelets 150 - 400 K/uL 209 177    Lipid Panel No results for input(s): CHOL, TRIG, LDLCALC, VLDL, HDL, CHOLHDL, LDLDIRECT in the last 8760 hours.  HEMOGLOBIN A1C No results found for: HGBA1C, MPG TSH No results for input(s): TSH in the last 8760 hours.  External labs:   05/15/2020: Serum glucose 184 mg, BUN 26, creatinine 1.60, EGFR 35 mL, sodium 137, potassium 4.3, CMP otherwise normal. A1c 10.0%. BNP 08/10/1975. Total cholesterol 224, triglycerides 46, HDL 113, LDL 103.  Non-HDL cholesterol 111. Labs 10/20/2016: TSH normal.  Medications and allergies   Allergies  Allergen Reactions  . Amlodipine Swelling     Outpatient Medications Prior to Visit  Medication Sig Dispense Refill  . Calcium-Magnesium-Vitamin D [phone] MG-MG-UNIT TABS Take by mouth.    . Cholecalciferol (VITAMIN D3) 25 MCG (1000 UT) CAPS     . Coenzyme Q10 200 MG/GM POWD Take by mouth.    . furosemide (LASIX) 40 MG tablet Take 40 mg by mouth in the morning. With additional 40 mg as needed for fluid overload     . gabapentin (NEURONTIN) 100 MG capsule TK 1 C PO BID    . glimepiride (AMARYL) 2 MG tablet Take 2 mg by mouth daily.    Marland Kitchen glipiZIDE (GLUCOTROL) 10 MG tablet     . Homeopathic Products (LEG CRAMPS PM SL) Place under the tongue.    . hydrALAZINE (APRESOLINE) 50 MG  tablet Take 1 tablet (50 mg total) by mouth 3 (three) times daily. 90 tablet 2  . ibuprofen (ADVIL) 200 MG tablet Take 200 mg by mouth every 6 (six) hours as needed.    . insulin lispro (HUMALOG) 100 UNIT/ML KwikPen Inject 10 Units into the skin at bedtime.    . Magnesium 250 MG TABS Take by mouth.    . Misc Natural Products (LEG VEIN & CIRCULATION) TABS Take by mouth.    . pravastatin (PRAVACHOL) 20 MG tablet TAKE 1 TABLET BY MOUTH QD AT BEDTIME    .  silver sulfADIAZINE (SILVADENE) 1 % cream Apply 1 application topically daily. 50 g 0  . TRADJENTA 5 MG TABS tablet TK 1 T PO QD    . triamcinolone cream (KENALOG) 0.5 % Apply topically 2 (two) times daily. 30 g 1  . vitamin B-12 (CYANOCOBALAMIN) 1000 MCG tablet     . isosorbide dinitrate (ISORDIL) 30 MG tablet Take 1 tablet (30 mg total) by mouth 3 (three) times daily. 90 tablet 2  . Accu-Chek FastClix Lancets MISC See admin instructions.    . BD PEN NEEDLE NANO 2ND GEN 32G X 4 MM MISC 3 (three) times daily. as directed    . Blood Glucose Monitoring Suppl (ACCU-CHEK GUIDE ME) w/Device KIT See admin instructions.    Marland Kitchen glucose blood test strip TEST BLOOD SUGARS 3 4 TIMES DAILY.    . NON FORMULARY 125 mg. [employer], womans vitamins     No facility-administered medications prior to visit.    Radiology:   Chest x-ray single view 04/25/2019: Mediastinum hilar structures normal. Cardiomegaly. Mild pulmonary venous congestion bilateral interstitial prominence. Findings suggest mild CHF.  Calcified pulmonary nodules noted consistent with old calcified granulomas. Small right pleural effusion cannot be Excluded.  Cardiac Studies:   ABI 06/19/2020:  This exam reveals normal perfusion of the right and left lower extremity  (ABI 1.00). Mildly abnormal biphasic waveform noted at the ankles. There  is mild plaque evident in the lower extremity vessels at the ankles.  PCV ECHOCARDIOGRAM COMPLETE 06/19/2020 Left ventricle cavity is  normal in size and wall thickness. Normal global wall motion. Normal LV systolic function with EF 55%. Doppler evidence of grade II (pseudonormal) diastolic dysfunction, elevated LAP. Calculated EF 55%. Left atrial cavity is mildly dilated. Right atrial cavity is moderately dilated. Right ventricle cavity is moderately dilated. Moderately reduced right ventricular function. Trileaflet aortic valve with mild aortic valve leaflet calcification. Trace aortic stenosis. Mild (Grade I) aortic regurgitation. Moderate (Grade III) mitral regurgitation. Severe tricuspid regurgitation. Moderate pulmonary hypertension. Estimated pulmonary artery systolic pressure 52 mmHg.   PCV MYOCARDIAL PERFUSION WITH LEXISCAN 06/11/2020 Nondiagnostic ECG stress. Perfusion imaging study demonstrates decreased uptake in the inferior wall suggestive of soft tissue attenuation.  Ischemia in this region cannot be completely excluded. Overall LV systolic function is normal without regional wall motion abnormalities. Stress LV EF: 82%. Low risk.   EKG:   EKG 03/19/2020: Sinus rhythm with first-degree AV block at rate of 80 bpm, left axis deviation, left anterior fascicular block.  Right bundle branch block.  Trifascicular block.  Assessment     ICD-10-CM   1. Primary hypertension  I10 isosorbide dinitrate (ISORDIL) 40 MG tablet    Basic metabolic panel  2. Dyspnea on exertion  R06.00 Brain natriuretic peptide  3. Bilateral leg edema  R60.0   4. 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 isosorbide dinitrate (ISORDIL) 30 MG tablet     Meds ordered this encounter  Medications  . isosorbide dinitrate (ISORDIL) 40 MG tablet    Sig: Take 1 tablet (40 mg total) by mouth 3 (three) times daily.    Dispense:  270 tablet    Refill:  3    Discontinue amlodipine  . olmesartan (BENICAR) 20 MG tablet    Sig: Take 1 tablet (20 mg total) by mouth daily.    Dispense:   90 tablet    Refill:  3   Recommendations:   [name] is a  80 y.o. 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  Patient underwent cardiac work-up in February 2022.Ankle-brachial index revealed normal perfusion of bilateral lower extremities with mild plaque evident at the ankles.  Echocardiogram revealed normal LVEF at 29%, grade 2 diastolic dysfunction, dilated LA and RA as well as moderate mitral regurgitation, severe tricuspid regurgitation and moderate pulmonary hypertension, likely group 2.  Nuclear stress test was low risk with normal LV systolic function and no regional wall motion abnormalities, however ischemia in the inferior wall cannot be excluded.  Patient presents for 3-week follow-up of hypertension and diastolic heart failure.  At last visit started isosorbide dinitrate 30 mg 3 times daily.  Patient reports medication compliance, however it is unclear upon further questioning whether she is truly compliant with medications at this time.  Encourage patient to obtain a pillbox and have her daughter assist her in managing medications, patient verbalized understanding agreement.  On presentation patient's blood pressure was markedly elevated, however did improve upon recheck to 160/90 mmHg.  Counseled patient regarding risk of stroke as well as signs and symptoms that would warrant emergent evaluation, she verbalized understanding and agreement.  On exam there are no focal neurologic deficits.  Discussed at length with patient regarding the importance of medication compliance and blood pressure control.  In view of continued uncontrolled hypertension we will increase isosorbide dinitrate to 40 mg 3 times daily and add olmesartan 20 mg daily.  We will repeat BMP in 1 week.  Patient may  benefit from addition of spironolactone in the future in view of diastolic dysfunction.  To advanced age and underlying bifascicular block will avoid use of beta-blocker therapy for treatment of hypertension.  As patient's bilateral lower leg edema has resolved, may consider reducing furosemide dosing in the future.  Follow-up in 2 weeks, sooner if needed, for uncontrolled hypertension.   Terry Berthold, PA-C 08/07/2020, 2:02 PM Office: [phone]

## 2020-08-09 LAB — BASIC METABOLIC PANEL
BUN/Creatinine Ratio: 13 (ref 12–28)
BUN: 21 mg/dL (ref 8–27)
CO2: 23 mmol/L (ref 20–29)
Calcium: 9.3 mg/dL (ref 8.7–10.3)
Chloride: 100 mmol/L (ref 96–106)
Creatinine, Ser: 1.66 mg/dL — ABNORMAL HIGH (ref 0.57–1.00)
Glucose: 179 mg/dL — ABNORMAL HIGH (ref 65–99)
Potassium: 4 mmol/L (ref 3.5–5.2)
Sodium: 141 mmol/L (ref 134–144)
eGFR: 31 mL/min/{1.73_m2} — ABNORMAL LOW (ref >59)

## 2020-08-09 LAB — BRAIN NATRIURETIC PEPTIDE: BNP: 495.4 pg/mL — ABNORMAL HIGH (ref 0.0–100.0)

## 2020-08-12 NOTE — Progress Notes (Signed)
Attempted to call patient to discuss lab results, mailbox is full and therefore was unable to leave voicemail.  Renal function has decreased since addition of olmesartan, however BNP is also up.  Wish to discuss management of olmesartan as well as diuretics.

## 2020-08-14 ENCOUNTER — Ambulatory Visit (HOSPITAL_COMMUNITY): Admission: RE | Admit: 2020-08-14 | Payer: Medicare Other | Source: Ambulatory Visit

## 2020-08-18 ENCOUNTER — Other Ambulatory Visit: Payer: Self-pay | Admitting: Student

## 2020-08-18 DIAGNOSIS — R0600 Dyspnea, unspecified: Secondary | ICD-10-CM

## 2020-08-18 DIAGNOSIS — R0609 Other forms of dyspnea: Secondary | ICD-10-CM

## 2020-08-18 DIAGNOSIS — I10 Essential (primary) hypertension: Secondary | ICD-10-CM

## 2020-08-18 NOTE — Progress Notes (Signed)
Called and spoke to pt regarding lab results. Pt voiced understanding and will be getting lab work done before next appointment.

## 2020-08-18 NOTE — Progress Notes (Signed)
Please call patient and have her go to [facility] for labs before visit this week.

## 2020-08-20 ENCOUNTER — Encounter: Payer: Self-pay | Admitting: Podiatry

## 2020-08-20 ENCOUNTER — Ambulatory Visit (INDEPENDENT_AMBULATORY_CARE_PROVIDER_SITE_OTHER): Payer: Medicare Other | Admitting: Podiatry

## 2020-08-20 ENCOUNTER — Other Ambulatory Visit: Payer: Self-pay

## 2020-08-20 DIAGNOSIS — B351 Tinea unguium: Secondary | ICD-10-CM | POA: Diagnosis not present

## 2020-08-20 DIAGNOSIS — M79675 Pain in left toe(s): Secondary | ICD-10-CM

## 2020-08-20 DIAGNOSIS — E1142 Type 2 diabetes mellitus with diabetic polyneuropathy: Secondary | ICD-10-CM

## 2020-08-20 DIAGNOSIS — N183 Chronic kidney disease, stage 3 unspecified: Secondary | ICD-10-CM | POA: Diagnosis not present

## 2020-08-20 DIAGNOSIS — M205X1 Other deformities of toe(s) (acquired), right foot: Secondary | ICD-10-CM

## 2020-08-20 DIAGNOSIS — M79674 Pain in right toe(s): Secondary | ICD-10-CM

## 2020-08-20 NOTE — Progress Notes (Signed)
Primary Physician/Referring:  Trey Sailors, PA  Patient ID: [name], female    DOB: [birth year], 80 y.o.   MRN: [id number]  Chief Complaint  Patient presents with  . Hypertension   HPI:    [name]  is a 80 y.o. 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  Patient presents for 2-week follow-up of uncontrolled hypertension.  At last visit increased isosorbide dinitrate to 40 mg 3 times daily and added olmesartan 20 mg daily.  Repeat BMP showed mildly reduced renal function, however BNP was also elevated.  Therefore advised patient to continue adjusted medications with out that olmesartan may improve BNP.  Ordered repeat BNP and BMP prior to today's office visit, unfortunately this was not done.  Also upon further questioning patient states she has not been taking Benicar, she never picked it up from the pharmacy.  She does report she has now consistently taking BiDil 3 times daily.  She remains asymptomatic.  Denies chest pain, palpitations, syncope, near syncope, orthopnea, PND.  She has had no further bilateral lower leg edema.  Past Medical History:  Diagnosis Date  . Diabetes mellitus without complication ([place])   . Hypertension    Past Surgical History:  Procedure Laterality Date  . TONSILLECTOMY     History reviewed. No pertinent family history.  No history of premature coronary artery disease or sudden cardiac death in her family. Social History   Tobacco Use  . Smoking status: Never Smoker  . Smokeless tobacco: Never Used  Substance Use Topics  . Alcohol use: No   Marital Status: Divorced  ROS  Review of Systems  Constitutional: Negative for malaise/fatigue and weight gain.  Eyes: Negative for visual disturbance.  Cardiovascular: Negative for chest pain,  claudication, dyspnea on exertion (imrpoved), leg swelling (Resolved), near-syncope, orthopnea, palpitations, paroxysmal nocturnal dyspnea and syncope.  Respiratory: Negative for shortness of breath.   Hematologic/Lymphatic: Does not bruise/bleed easily.  Musculoskeletal: Positive for muscle cramps (leg cramps at night).  Gastrointestinal: Negative for melena.  Neurological: Negative for dizziness, headaches, light-headedness and weakness.   Objective  Blood pressure (!) 156/94, pulse 82, temperature 97.8 F (36.6 C), height '5\' 4"'  (1.626 m), weight 161 lb (73 kg), SpO2 99 %.  Vitals with BMI 08/21/2020 08/07/2020 08/07/2020  Height '5\' 4"'  - '5\' 4"'   Weight 161 lbs - 159 lbs  BMI 26.71 - 24.58  Systolic 099 833 825  Diastolic 94 93 90  Pulse 82 82 85     Physical Exam Constitutional:      Comments: Mildly obese in no acute distress.  Cardiovascular:     Rate and Rhythm: Normal rate and regular rhythm.     Pulses:          Carotid pulses are 2+ on the right side and 2+ on the left side.      Femoral pulses are 2+ on the right side and 2+ on the left side.      Dorsalis pedis pulses are 2+ on the right side and 1+ on the left side.       Posterior tibial pulses are 2+ on the right side and 0 on the left side.     Heart sounds: Normal heart sounds. No murmur heard. No gallop.      Comments: Popliteal pulse difficult to feel due to patient's body habitus.  No JVD.   Pulmonary:     Effort: Pulmonary effort is normal.     Breath sounds: Normal breath sounds. No wheezing or rales.  Abdominal:     Comments: Obese. Pannus present  Musculoskeletal:     Right lower leg: No edema.     Left lower leg: No edema.  Skin:    General: Skin is warm and dry.     Comments: Hair loss and atrophic changes bilateral lower legs.  Neurological:     General: No focal deficit present.     Mental Status: She is alert and oriented to person, place, and time.    Laboratory examination:   Recent Labs     07/21/20 1321 08/08/20 1449  NA 137 141  K 4.1 4.0  CL 95* 100  CO2 24 23  GLUCOSE 412* 179*  BUN 22 21  CREATININE 1.42* 1.66*  CALCIUM 9.6 9.3   estimated creatinine clearance is 27.3 mL/min (A) (by C-G formula based on SCr of 1.66 mg/dL (H)).  CMP Latest Ref Rng & Units 08/08/2020 07/21/2020 08/09/2019  Glucose 65 - 99 mg/dL 179(H) 412(H) 405(H)  BUN 8 - 27 mg/dL '21 22 18  ' Creatinine 0.57 - 1.00 mg/dL 1.66(H) 1.42(H) 1.51(H)  Sodium 134 - 144 mmol/L 141 137 135  Potassium 3.5 - 5.2 mmol/L 4.0 4.1 4.8  Chloride 96 - 106 mmol/L 100 95(L) 95(L)  CO2 20 - 29 mmol/L '23 24 26  ' Calcium 8.7 - 10.3 mg/dL 9.3 9.6 9.9   CBC Latest Ref Rng & Units 04/25/2019 05/23/2018  WBC 4.0 - 10.5 K/uL 4.8 4.4  Hemoglobin 12.0 - 15.0 g/dL 8.7(L) 11.6(L)  Hematocrit 36.0 - 46.0 % 27.9(L) 37.7  Platelets 150 - 400 K/uL 209 177    Lipid Panel No results for input(s): CHOL, TRIG, LDLCALC, VLDL, HDL, CHOLHDL, LDLDIRECT in the last 8760 hours.  HEMOGLOBIN A1C No results found for: HGBA1C, MPG TSH No results for input(s): TSH in the last 8760 hours.  BNP    Component Value Date/Time   BNP 495.4 (H) 08/08/2020 1449   BNP 628.4 (H) 04/25/2019 1410    ProBNP No results found for: PROBNP  External labs:   05/15/2020: Serum glucose 184 mg, BUN 26, creatinine 1.60, EGFR 35 mL, sodium 137, potassium 4.3, CMP otherwise normal. A1c 10.0%. BNP 08/10/1975. Total cholesterol 224, triglycerides 46, HDL 113, LDL 103.  Non-HDL cholesterol 111. Labs 10/20/2016: TSH normal.  Medications and allergies   Allergies  Allergen Reactions  . Amlodipine Swelling     Outpatient Medications Prior to Visit  Medication Sig Dispense Refill  . Accu-Chek FastClix Lancets MISC See admin instructions.    . BD PEN NEEDLE NANO 2ND GEN 32G X 4 MM MISC 3 (three) times daily. as directed    . Blood Glucose Monitoring Suppl (ACCU-CHEK GUIDE ME) w/Device KIT See admin instructions.    . Calcium-Magnesium-Vitamin D  [phone] MG-MG-UNIT TABS Take by mouth.    . Cholecalciferol (VITAMIN D3) 25 MCG (1000 UT) CAPS     . Coenzyme Q10 200 MG/GM POWD Take by mouth.    . furosemide (LASIX) 40 MG tablet Take 40 mg by mouth in the morning. With additional 40 mg as needed for fluid overload     . gabapentin (NEURONTIN) 100 MG capsule TK 1 C PO BID    . glimepiride (AMARYL) 2 MG tablet Take 2 mg by mouth daily.    Marland Kitchen glipiZIDE (GLUCOTROL) 10 MG tablet     . glucose  blood test strip TEST BLOOD SUGARS 3 4 TIMES DAILY.    Marland Kitchen Homeopathic Products (LEG CRAMPS PM SL) Place under the tongue.    . hydrALAZINE (APRESOLINE) 50 MG tablet Take 1 tablet (50 mg total) by mouth 3 (three) times daily. 90 tablet 2  . ibuprofen (ADVIL) 200 MG tablet Take 200 mg by mouth every 6 (six) hours as needed.    . insulin lispro (HUMALOG) 100 UNIT/ML KwikPen Inject 10 Units into the skin at bedtime.    . isosorbide dinitrate (ISORDIL) 40 MG tablet Take 1 tablet (40 mg total) by mouth 3 (three) times daily. 270 tablet 3  . Magnesium 250 MG TABS Take by mouth.    . Misc Natural Products (LEG VEIN & CIRCULATION) TABS Take by mouth.    . NON FORMULARY 125 mg. [employer], womans vitamins    . olmesartan (BENICAR) 20 MG tablet Take 1 tablet (20 mg total) by mouth daily. 90 tablet 3  . pravastatin (PRAVACHOL) 20 MG tablet TAKE 1 TABLET BY MOUTH QD AT BEDTIME    . silver sulfADIAZINE (SILVADENE) 1 % cream Apply 1 application topically daily. 50 g 0  . TRADJENTA 5 MG TABS tablet TK 1 T PO QD    . triamcinolone cream (KENALOG) 0.5 % Apply topically 2 (two) times daily. 30 g 1  . vitamin B-12 (CYANOCOBALAMIN) 1000 MCG tablet     . furosemide (LASIX) 20 MG tablet TAKE 1 TABLET(20 MG) BY MOUTH TWICE DAILY 180 tablet 3   No facility-administered medications prior to visit.    Radiology:   Chest x-ray single view 04/25/2019: Mediastinum hilar structures normal. Cardiomegaly. Mild pulmonary venous congestion bilateral interstitial  prominence. Findings suggest mild CHF.  Calcified pulmonary nodules noted consistent with old calcified granulomas. Small right pleural effusion cannot be Excluded.  Cardiac Studies:   ABI 06/19/2020:  This exam reveals normal perfusion of the right and left lower extremity  (ABI 1.00). Mildly abnormal biphasic waveform noted at the ankles. There  is mild plaque evident in the lower extremity vessels at the ankles.  PCV ECHOCARDIOGRAM COMPLETE 06/19/2020 Left ventricle cavity is normal in size and wall thickness. Normal global wall motion. Normal LV systolic function with EF 55%. Doppler evidence of grade II (pseudonormal) diastolic dysfunction, elevated LAP. Calculated EF 55%. Left atrial cavity is mildly dilated. Right atrial cavity is moderately dilated. Right ventricle cavity is moderately dilated. Moderately reduced right ventricular function. Trileaflet aortic valve with mild aortic valve leaflet calcification. Trace aortic stenosis. Mild (Grade I) aortic regurgitation. Moderate (Grade III) mitral regurgitation. Severe tricuspid regurgitation. Moderate pulmonary hypertension. Estimated pulmonary artery systolic pressure 52 mmHg.   PCV MYOCARDIAL PERFUSION WITH LEXISCAN 06/11/2020 Nondiagnostic ECG stress. Perfusion imaging study demonstrates decreased uptake in the inferior wall suggestive of soft tissue attenuation.  Ischemia in this region cannot be completely excluded. Overall LV systolic function is normal without regional wall motion abnormalities. Stress LV EF: 82%. Low risk.   EKG:   EKG 03/19/2020: Sinus rhythm with first-degree AV block at rate of 80 bpm, left axis deviation, left anterior fascicular block.  Right bundle branch block.  Trifascicular block.  Assessment     ICD-10-CM   1. 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 Chronic diastolic heart failure (HCC)  I50.32      Medications Discontinued During This Encounter  Medication  Reason  . furosemide (LASIX) 20 MG tablet Change in therapy    No orders of  the defined types were placed in this encounter.  Recommendations:   Britton Perkinson is a 80 y.o. 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  Patient underwent cardiac work-up in February 2022.Ankle-brachial index revealed normal perfusion of bilateral lower extremities with mild plaque evident at the ankles.  Echocardiogram revealed normal LVEF at 21%, grade 2 diastolic dysfunction, dilated LA and RA as well as moderate mitral regurgitation, severe tricuspid regurgitation and moderate pulmonary hypertension, likely group 2.  Nuclear stress test was low risk with normal LV systolic function and no regional wall motion abnormalities, however ischemia in the inferior wall cannot be excluded.  Patient presents for 2-week follow-up of uncontrolled hypertension.  At last visit increased isosorbide dinitrate to 40 mg 3 times daily and added olmesartan 20 mg daily.  Repeat BMP showed mildly reduced renal function, however BNP was also elevated.  Therefore advised patient to continue adjusted medications with out that olmesartan may improve BNP.  Ordered repeat BNP and BMP prior to today's office visit, unfortunately it was not done.   In view of elevated BNP advised patient to increase Lasix to 40 mg twice daily for 3 days.  Patient will then return to taking Lasix 40 mg once daily.  Also advised patient to start telmisartan as her blood pressure remains uncontrolled.  We will plan to repeat BNP and BMP in 1 week.  Otherwise will not make changes to patient's medications at this time.  As patient seems difficulty with medication compliance and blood pressure remains uncontrolled, patient is enrolled with our office in remote monitoring  of blood pressure.  She will work with our office clinical pharmacist.  Notably will continue to avoid AV nodal blocking agents in view of underlying bifascicular block and advanced age.  Again discussed at length regarding importance of diet and medication compliance.  Patient continues to admit to high sodium intake.  Follow-up in 6 weeks, sooner if needed, for hypertension and diastolic heart failure.   Alethia Berthold, PA-C 08/21/2020, 1:08 PM Office: [phone]

## 2020-08-20 NOTE — Progress Notes (Addendum)
This patient returns to my office for at risk foot care.  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  Patient is taking plavix..  This patient is unable to cut nails herself since the patient cannot reach her nails.These nails are painful walking and wearing shoes.  This patient presents for at risk foot care today. Patient asks for diabetic shoes.  General Appearance  Alert, conversant and in no acute stress.  Vascular  Dorsalis pedis and posterior tibial  pulses are weakly  palpable  bilaterally.  Capillary return is within normal limits  bilaterally. Cold feet.  bilaterally.  Neurologic  Senn-Weinstein monofilament wire test diminished   bilaterally. Muscle power within normal limits bilaterally.  Nails Thick disfigured discolored nails with subungual debris  from hallux to fifth toes bilaterally. No evidence of bacterial infection or drainage bilaterally.  Orthopedic  No limitations of motion  feet .  No crepitus or effusions noted.  No bony pathology or digital deformities noted.  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  Pes planus  Skin  normotropic skin with no porokeratosis noted bilaterally.  No signs of infections or ulcers noted.     Onychomycosis  Pain in right toes  Pain in left toes  Pes planus.  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    Consent was obtained for treatment procedures.   Mechanical debridement of nails 1-5  bilaterally performed with a nail nipper.  Filed with dremel without incident.  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  She is awaiting her diabetic shoes.   Return office visit   3 months                   Told patient to return for periodic foot care and evaluation due to potential at risk complications.   Gardiner Barefoot DPM

## 2020-08-21 ENCOUNTER — Encounter: Payer: Self-pay | Admitting: Student

## 2020-08-21 ENCOUNTER — Ambulatory Visit: Payer: Medicare HMO | Admitting: Student

## 2020-08-21 VITALS — BP 156/94 | HR 82 | Temp 97.8°F | Ht 64.0 in | Wt 161.0 lb

## 2020-08-21 DIAGNOSIS — I5032 Chronic diastolic (congestive) heart failure: Secondary | ICD-10-CM

## 2020-08-21 DIAGNOSIS — I10 Essential (primary) hypertension: Secondary | ICD-10-CM

## 2020-08-21 DIAGNOSIS — I453 Trifascicular block: Secondary | ICD-10-CM

## 2020-08-22 ENCOUNTER — Telehealth: Payer: Self-pay | Admitting: Pharmacist

## 2020-08-28 NOTE — Telephone Encounter (Signed)
done

## 2020-08-29 NOTE — Telephone Encounter (Signed)
Pt came to the office with her medication. Med list reviewed and updated. Reviewed in great detail the indications, directions, and side effects of all her medications. Home BP noted to be improving but BG readings considerable elevated with readings in 300s and 400s. Called to make pt's PCP aware of pt's BG readings. Pt attests to symptoms of polydipsia, polyuria, polyphagia, blurry vision, neuropathy. Pt has an upcoming OV scheduled for next month.   Pt not currently taking Olmesartan as previously discussed by Celeste. Called pt's pharmacy and per pharmacy staff, pt noted to have never previously picked up the Rx initially. Pt agreeable to restart on Olmesartan and getting lab works completed in 1 week.

## 2020-08-31 DIAGNOSIS — I480 Paroxysmal atrial fibrillation: Secondary | ICD-10-CM

## 2020-08-31 HISTORY — DX: Paroxysmal atrial fibrillation: I48.0

## 2020-09-05 ENCOUNTER — Emergency Department (HOSPITAL_COMMUNITY): Payer: Medicare Other

## 2020-09-05 ENCOUNTER — Other Ambulatory Visit: Payer: Self-pay | Admitting: *Deleted

## 2020-09-05 ENCOUNTER — Encounter (HOSPITAL_COMMUNITY): Payer: Self-pay | Admitting: Emergency Medicine

## 2020-09-05 ENCOUNTER — Inpatient Hospital Stay (HOSPITAL_COMMUNITY)
Admission: EM | Admit: 2020-09-05 | Discharge: 2020-09-08 | DRG: 309 | Disposition: A | Discharge status: Home / Self Care | Payer: Medicare Other | Attending: Internal Medicine | Admitting: Internal Medicine

## 2020-09-05 ENCOUNTER — Other Ambulatory Visit: Payer: Self-pay

## 2020-09-05 DIAGNOSIS — N183 Chronic kidney disease, stage 3 unspecified: Secondary | ICD-10-CM | POA: Diagnosis present

## 2020-09-05 DIAGNOSIS — I248 Other forms of acute ischemic heart disease: Secondary | ICD-10-CM | POA: Diagnosis present

## 2020-09-05 DIAGNOSIS — J101 Influenza due to other identified influenza virus with other respiratory manifestations: Secondary | ICD-10-CM | POA: Diagnosis present

## 2020-09-05 DIAGNOSIS — Z79899 Other long term (current) drug therapy: Secondary | ICD-10-CM

## 2020-09-05 DIAGNOSIS — I872 Venous insufficiency (chronic) (peripheral): Secondary | ICD-10-CM

## 2020-09-05 DIAGNOSIS — N1832 Chronic kidney disease, stage 3b: Secondary | ICD-10-CM | POA: Diagnosis present

## 2020-09-05 DIAGNOSIS — I4819 Other persistent atrial fibrillation: Secondary | ICD-10-CM

## 2020-09-05 DIAGNOSIS — E1122 Type 2 diabetes mellitus with diabetic chronic kidney disease: Secondary | ICD-10-CM | POA: Diagnosis present

## 2020-09-05 DIAGNOSIS — Z7984 Long term (current) use of oral hypoglycemic drugs: Secondary | ICD-10-CM

## 2020-09-05 DIAGNOSIS — I10 Essential (primary) hypertension: Secondary | ICD-10-CM | POA: Diagnosis present

## 2020-09-05 DIAGNOSIS — R600 Localized edema: Secondary | ICD-10-CM

## 2020-09-05 DIAGNOSIS — R9431 Abnormal electrocardiogram [ECG] [EKG]: Secondary | ICD-10-CM

## 2020-09-05 DIAGNOSIS — E1165 Type 2 diabetes mellitus with hyperglycemia: Secondary | ICD-10-CM | POA: Diagnosis present

## 2020-09-05 DIAGNOSIS — I5032 Chronic diastolic (congestive) heart failure: Secondary | ICD-10-CM | POA: Diagnosis present

## 2020-09-05 DIAGNOSIS — I452 Bifascicular block: Secondary | ICD-10-CM | POA: Diagnosis present

## 2020-09-05 DIAGNOSIS — E876 Hypokalemia: Secondary | ICD-10-CM | POA: Diagnosis present

## 2020-09-05 DIAGNOSIS — Z794 Long term (current) use of insulin: Secondary | ICD-10-CM

## 2020-09-05 DIAGNOSIS — I4891 Unspecified atrial fibrillation: Principal | ICD-10-CM | POA: Diagnosis present

## 2020-09-05 DIAGNOSIS — E1129 Type 2 diabetes mellitus with other diabetic kidney complication: Secondary | ICD-10-CM

## 2020-09-05 DIAGNOSIS — I272 Pulmonary hypertension, unspecified: Secondary | ICD-10-CM | POA: Diagnosis present

## 2020-09-05 DIAGNOSIS — I130 Hypertensive heart and chronic kidney disease with heart failure and stage 1 through stage 4 chronic kidney disease, or unspecified chronic kidney disease: Secondary | ICD-10-CM | POA: Diagnosis present

## 2020-09-05 DIAGNOSIS — Z888 Allergy status to other drugs, medicaments and biological substances status: Secondary | ICD-10-CM

## 2020-09-05 DIAGNOSIS — I083 Combined rheumatic disorders of mitral, aortic and tricuspid valves: Secondary | ICD-10-CM | POA: Diagnosis present

## 2020-09-05 DIAGNOSIS — Z20822 Contact with and (suspected) exposure to covid-19: Secondary | ICD-10-CM | POA: Diagnosis present

## 2020-09-05 NOTE — ED Triage Notes (Signed)
Brought in by EMS from home. At 4pm pt started having intermittent confusion, weakness, and became sluggish per the pt family. Strong urinary odor present on arrival.

## 2020-09-06 ENCOUNTER — Inpatient Hospital Stay (HOSPITAL_COMMUNITY): Payer: Medicare HMO

## 2020-09-06 ENCOUNTER — Emergency Department (HOSPITAL_COMMUNITY): Payer: Medicare Other

## 2020-09-06 ENCOUNTER — Encounter (HOSPITAL_COMMUNITY): Payer: Self-pay | Admitting: Family Medicine

## 2020-09-06 DIAGNOSIS — I4891 Unspecified atrial fibrillation: Secondary | ICD-10-CM | POA: Diagnosis not present

## 2020-09-06 DIAGNOSIS — R9431 Abnormal electrocardiogram [ECG] [EKG]: Secondary | ICD-10-CM

## 2020-09-06 DIAGNOSIS — E1129 Type 2 diabetes mellitus with other diabetic kidney complication: Secondary | ICD-10-CM

## 2020-09-06 DIAGNOSIS — J101 Influenza due to other identified influenza virus with other respiratory manifestations: Secondary | ICD-10-CM

## 2020-09-06 DIAGNOSIS — I4819 Other persistent atrial fibrillation: Secondary | ICD-10-CM

## 2020-09-06 LAB — CBC
HCT: 32.2 % — ABNORMAL LOW (ref 36.0–46.0)
Hemoglobin: 10.2 g/dL — ABNORMAL LOW (ref 12.0–15.0)
MCH: 29.8 pg (ref 26.0–34.0)
MCHC: 31.7 g/dL (ref 30.0–36.0)
MCV: 94.2 fL (ref 80.0–100.0)
Platelets: 128 10*3/uL — ABNORMAL LOW (ref 150–400)
RBC: 3.42 MIL/uL — ABNORMAL LOW (ref 3.87–5.11)
RDW: 14.9 % (ref 11.5–15.5)
WBC: 5.9 10*3/uL (ref 4.0–10.5)
nRBC: 0 % (ref 0.0–0.2)

## 2020-09-06 LAB — CBC WITH DIFFERENTIAL/PLATELET
Abs Immature Granulocytes: 0.04 10*3/uL (ref 0.00–0.07)
Basophils Absolute: 0 10*3/uL (ref 0.0–0.1)
Basophils Relative: 0 %
Eosinophils Absolute: 0 10*3/uL (ref 0.0–0.5)
Eosinophils Relative: 0 %
HCT: 33.6 % — ABNORMAL LOW (ref 36.0–46.0)
Hemoglobin: 10.4 g/dL — ABNORMAL LOW (ref 12.0–15.0)
Immature Granulocytes: 1 %
Lymphocytes Relative: 8 %
Lymphs Abs: 0.6 10*3/uL — ABNORMAL LOW (ref 0.7–4.0)
MCH: 29.1 pg (ref 26.0–34.0)
MCHC: 31 g/dL (ref 30.0–36.0)
MCV: 93.9 fL (ref 80.0–100.0)
Monocytes Absolute: 0.8 10*3/uL (ref 0.1–1.0)
Monocytes Relative: 12 %
Neutro Abs: 5.4 10*3/uL (ref 1.7–7.7)
Neutrophils Relative %: 79 %
Platelets: 144 10*3/uL — ABNORMAL LOW (ref 150–400)
RBC: 3.58 MIL/uL — ABNORMAL LOW (ref 3.87–5.11)
RDW: 14.9 % (ref 11.5–15.5)
WBC: 6.9 10*3/uL (ref 4.0–10.5)
nRBC: 0 % (ref 0.0–0.2)

## 2020-09-06 LAB — URINALYSIS, ROUTINE W REFLEX MICROSCOPIC
Bacteria, UA: NONE SEEN
Bilirubin Urine: NEGATIVE
Glucose, UA: >=500 mg/dL — AB
Ketones, ur: NEGATIVE mg/dL
Leukocytes,Ua: NEGATIVE
Nitrite: NEGATIVE
Protein, ur: >=300 mg/dL — AB
Specific Gravity, Urine: 1.023 (ref 1.005–1.030)
pH: 7 (ref 5.0–8.0)

## 2020-09-06 LAB — COMPREHENSIVE METABOLIC PANEL
ALT: 42 U/L (ref 0–44)
AST: 47 U/L — ABNORMAL HIGH (ref 15–41)
Albumin: 3.8 g/dL (ref 3.5–5.0)
Alkaline Phosphatase: 77 U/L (ref 38–126)
Anion gap: 11 (ref 5–15)
BUN: 21 mg/dL (ref 8–23)
CO2: 23 mmol/L (ref 22–32)
Calcium: 9.2 mg/dL (ref 8.9–10.3)
Chloride: 103 mmol/L (ref 98–111)
Creatinine, Ser: 1.65 mg/dL — ABNORMAL HIGH (ref 0.44–1.00)
GFR, Estimated: 32 mL/min — ABNORMAL LOW (ref >60)
Glucose, Bld: 245 mg/dL — ABNORMAL HIGH (ref 70–99)
Potassium: 3.7 mmol/L (ref 3.5–5.1)
Sodium: 137 mmol/L (ref 135–145)
Total Bilirubin: 0.6 mg/dL (ref 0.3–1.2)
Total Protein: 7.7 g/dL (ref 6.5–8.1)

## 2020-09-06 LAB — PROTIME-INR
INR: 1.1 (ref 0.8–1.2)
Prothrombin Time: 14.4 seconds (ref 11.4–15.2)

## 2020-09-06 LAB — TROPONIN I (HIGH SENSITIVITY)
Troponin I (High Sensitivity): 139 ng/L (ref <18)
Troponin I (High Sensitivity): 124 ng/L (ref <18)
Troponin I (High Sensitivity): 164 ng/L (ref <18)

## 2020-09-06 LAB — ECHOCARDIOGRAM COMPLETE
Area-P 1/2: 4.06 cm2
Calc EF: 61.4 %
Height: 64 in
MV M vel: 5.73 m/s
MV Peak grad: 131.3 mmHg
MV VTI: 1.43 cm2
Radius: 0.3 cm
S' Lateral: 2.1 cm
Single Plane A2C EF: 69.3 %
Single Plane A4C EF: 49.7 %
Weight: 2635.2 oz

## 2020-09-06 LAB — BASIC METABOLIC PANEL
Anion gap: 10 (ref 5–15)
BUN: 21 mg/dL (ref 8–23)
CO2: 25 mmol/L (ref 22–32)
Calcium: 9 mg/dL (ref 8.9–10.3)
Chloride: 103 mmol/L (ref 98–111)
Creatinine, Ser: 1.46 mg/dL — ABNORMAL HIGH (ref 0.44–1.00)
GFR, Estimated: 37 mL/min — ABNORMAL LOW (ref >60)
Glucose, Bld: 199 mg/dL — ABNORMAL HIGH (ref 70–99)
Potassium: 4 mmol/L (ref 3.5–5.1)
Sodium: 138 mmol/L (ref 135–145)

## 2020-09-06 LAB — LACTIC ACID, PLASMA
Lactic Acid, Venous: 2.2 mmol/L (ref 0.5–1.9)
Lactic Acid, Venous: 1.3 mmol/L (ref 0.5–1.9)

## 2020-09-06 LAB — RESP PANEL BY RT-PCR (FLU A&B, COVID) ARPGX2
Influenza A by PCR: POSITIVE — AB
Influenza B by PCR: NEGATIVE
SARS Coronavirus 2 by RT PCR: NEGATIVE

## 2020-09-06 LAB — GLUCOSE, CAPILLARY
Glucose-Capillary: 195 mg/dL — ABNORMAL HIGH (ref 70–99)
Glucose-Capillary: 215 mg/dL — ABNORMAL HIGH (ref 70–99)
Glucose-Capillary: 156 mg/dL — ABNORMAL HIGH (ref 70–99)

## 2020-09-06 LAB — CBG MONITORING, ED: Glucose-Capillary: 206 mg/dL — ABNORMAL HIGH (ref 70–99)

## 2020-09-06 LAB — HEMOGLOBIN A1C
Hgb A1c MFr Bld: 10.4 % — ABNORMAL HIGH (ref 4.8–5.6)
Mean Plasma Glucose: 251.78 mg/dL

## 2020-09-06 MED ORDER — LACTATED RINGERS IV SOLN: INTRAVENOUS | Status: DC

## 2020-09-06 MED ORDER — APIXABAN 2.5 MG PO TABS
2.5000 mg | ORAL_TABLET | Freq: Two times a day (BID) | ORAL | Status: DC
  Administered 2020-09-06 (×2): 2.5 mg via ORAL
  Filled 2020-09-06 (×3): qty 1

## 2020-09-06 MED ORDER — INSULIN ASPART 100 UNIT/ML IJ SOLN
0.0000 [IU] | Freq: Three times a day (TID) | INTRAMUSCULAR | Status: DC
Start: 2020-09-06 — End: 2020-09-08
  Administered 2020-09-06: 3 [IU] via SUBCUTANEOUS
  Administered 2020-09-06: 2 [IU] via SUBCUTANEOUS
  Administered 2020-09-06: 3 [IU] via SUBCUTANEOUS
  Administered 2020-09-07: 2 [IU] via SUBCUTANEOUS
  Administered 2020-09-07 (×2): 3 [IU] via SUBCUTANEOUS
  Administered 2020-09-08: 1 [IU] via SUBCUTANEOUS
  Administered 2020-09-08 (×2): 2 [IU] via SUBCUTANEOUS
  Filled 2020-09-06: qty 0.09

## 2020-09-06 MED ORDER — DILTIAZEM HCL 30 MG PO TABS
30.0000 mg | ORAL_TABLET | Freq: Two times a day (BID) | ORAL | Status: DC
  Administered 2020-09-06 – 2020-09-07 (×3): 30 mg via ORAL
  Filled 2020-09-06 (×3): qty 1

## 2020-09-06 MED ORDER — IRBESARTAN 150 MG PO TABS
150.0000 mg | ORAL_TABLET | Freq: Every day | ORAL | Status: DC
  Administered 2020-09-07 – 2020-09-08 (×2): 150 mg via ORAL
  Filled 2020-09-06 (×2): qty 1

## 2020-09-06 MED ORDER — OSELTAMIVIR PHOSPHATE 30 MG PO CAPS
30.0000 mg | ORAL_CAPSULE | Freq: Once | ORAL | Status: AC
  Administered 2020-09-06: 30 mg via ORAL
  Filled 2020-09-06: qty 1

## 2020-09-06 MED ORDER — PRAVASTATIN SODIUM 20 MG PO TABS
20.0000 mg | ORAL_TABLET | Freq: Every day | ORAL | Status: DC
  Administered 2020-09-06 – 2020-09-08 (×3): 20 mg via ORAL
  Filled 2020-09-06 (×3): qty 1

## 2020-09-06 MED ORDER — ACETAMINOPHEN 325 MG PO TABS: 650.0000 mg | ORAL_TABLET | Freq: Once | ORAL | Status: DC

## 2020-09-06 MED ORDER — ACETAMINOPHEN 325 MG PO TABS
650.0000 mg | ORAL_TABLET | ORAL | Status: DC | PRN
  Administered 2020-09-06 – 2020-09-08 (×2): 650 mg via ORAL
  Filled 2020-09-06 (×3): qty 2

## 2020-09-06 MED ORDER — LINAGLIPTIN 5 MG PO TABS
5.0000 mg | ORAL_TABLET | Freq: Every day | ORAL | Status: DC
  Administered 2020-09-07 – 2020-09-08 (×2): 5 mg via ORAL
  Filled 2020-09-06 (×2): qty 1

## 2020-09-06 MED ORDER — INSULIN ASPART 100 UNIT/ML IJ SOLN
0.0000 [IU] | Freq: Every day | INTRAMUSCULAR | Status: DC
  Filled 2020-09-06: qty 0.05

## 2020-09-06 MED ORDER — HYDRALAZINE HCL 50 MG PO TABS
50.0000 mg | ORAL_TABLET | Freq: Three times a day (TID) | ORAL | Status: DC
  Administered 2020-09-06 – 2020-09-08 (×8): 50 mg via ORAL
  Filled 2020-09-06: qty 1
  Filled 2020-09-06: qty 2
  Filled 2020-09-06 (×6): qty 1

## 2020-09-06 MED ORDER — LACTATED RINGERS IV BOLUS
1000.0000 mL | Freq: Once | INTRAVENOUS | Status: AC
  Administered 2020-09-06: 1000 mL via INTRAVENOUS

## 2020-09-06 MED ORDER — ISOSORBIDE DINITRATE 20 MG PO TABS
40.0000 mg | ORAL_TABLET | Freq: Three times a day (TID) | ORAL | Status: DC
  Administered 2020-09-06 – 2020-09-08 (×6): 40 mg via ORAL
  Filled 2020-09-06 (×9): qty 2

## 2020-09-06 MED ORDER — OSELTAMIVIR PHOSPHATE 30 MG PO CAPS
30.0000 mg | ORAL_CAPSULE | Freq: Every day | ORAL | Status: DC
  Administered 2020-09-07 – 2020-09-08 (×2): 30 mg via ORAL
  Filled 2020-09-06 (×2): qty 1

## 2020-09-06 MED ORDER — DILTIAZEM LOAD VIA INFUSION
10.0000 mg | Freq: Once | INTRAVENOUS | Status: DC
  Filled 2020-09-06: qty 10

## 2020-09-06 MED ORDER — DILTIAZEM HCL 25 MG/5ML IV SOLN
10.0000 mg | Freq: Once | INTRAVENOUS | Status: AC
  Administered 2020-09-06: 10 mg via INTRAVENOUS
  Filled 2020-09-06: qty 5

## 2020-09-06 NOTE — ED Notes (Signed)
Pt given bed bath, linen changed, placed on gown. Purewick applied to pt

## 2020-09-06 NOTE — Progress Notes (Signed)
   09/06/20 1722  Assess: MEWS Score  Temp (!) 100.7 F (38.2 C)  BP (!) 174/103  Pulse Rate (!) 101  Resp 16  SpO2 96 %  O2 Device Room Air  Assess: MEWS Score  MEWS Temp 1  MEWS Systolic 0  MEWS Pulse 1  MEWS RR 0  MEWS LOC 0  MEWS Score 2  MEWS Score Color Yellow  Assess: if the MEWS score is Yellow or Red  Were vital signs taken at a resting state? Yes  Focused Assessment No change from prior assessment  Early Detection of Sepsis Score *See Row Information* High  MEWS guidelines implemented *See Row Information* Yes  Treat  Pain Scale 0-10  Pain Score 0  Notify: Charge Nurse/RN  Name of Charge Nurse/RN Notified Dana, RN  Date Charge Nurse/RN Notified 09/06/20  Time Charge Nurse/RN Notified 1759

## 2020-09-06 NOTE — ED Provider Notes (Signed)
[place] DEPT Provider Note: Georgena Spurling, MD, FACEP  CSN: [id number] MRN: [id number] ARRIVAL: 09/05/20 at 2330 ROOM: WA17/WA17   CHIEF COMPLAINT  Fever   HISTORY OF PRESENT ILLNESS  09/06/20 12:29 AM [name] is a 80 y.o. female who began having intermittent confusion, generalized weakness and lethargy per her family.  They called EMS who found the patient to be febrile with a temperature of 103.  She was given 1 g of Tylenol prior to arrival.  They report a strong urinary odor present.  On arrival the patient's temperature was 102.1 and the sepsis protocol was initiated.  The patient denies acute complaint to me apart from feeling cold.  She denies any pain.  She denies any dysuria.  She states she has had a cough for about 2 months.   Past Medical History:  Diagnosis Date  . Diabetes mellitus without complication ([place])   . Hypertension     Past Surgical History:  Procedure Laterality Date  . TONSILLECTOMY      History reviewed. No pertinent family history.  Social History   Tobacco Use  . Smoking status: Never Smoker  . Smokeless tobacco: Never Used  Vaping Use  . Vaping Use: Never used  Substance Use Topics  . Alcohol use: No  . Drug use: No    Prior to Admission medications   Medication Sig Start Date End Date Taking? Authorizing Provider  Accu-Chek FastClix Lancets MISC See admin instructions. 01/16/19   [provider]  BD PEN NEEDLE NANO 2ND GEN 32G X 4 MM MISC 3 (three) times daily. as directed 08/13/19   [provider]  Blood Glucose Monitoring Suppl (ACCU-CHEK GUIDE ME) w/Device KIT See admin instructions. 12/11/18   [provider]  Cholecalciferol (VITAMIN D3) 50 MCG (2000 UT) capsule Take 1 capsule by mouth daily. 07/29/14   [provider]  furosemide (LASIX) 40 MG tablet Take 40 mg by mouth in the morning. With additional 40 mg as needed for fluid overload     [provider]  gabapentin (NEURONTIN)  100 MG capsule TK 1 C PO BID 12/12/18   [provider]  glimepiride (AMARYL) 2 MG tablet Take 2 mg by mouth daily. 07/22/20   [provider]  glucose blood test strip TEST BLOOD SUGARS 3 4 TIMES DAILY. 10/27/17   [provider]  hydrALAZINE (APRESOLINE) 50 MG tablet Take 1 tablet (50 mg total) by mouth 3 (three) times daily. 06/06/20   Adrian Prows, MD  insulin lispro (HUMALOG) 100 UNIT/ML KwikPen Inject 10 Units into the skin at bedtime. 08/14/19   [provider]  isosorbide dinitrate (ISORDIL) 40 MG tablet Take 1 tablet (40 mg total) by mouth 3 (three) times daily. 08/07/20   Cantwell, Celeste C, PA-C  Magnesium 250 MG TABS Take by mouth. Patient not taking: Reported on 08/22/2020    [provider]  Misc Natural Products ([place]) TABS Take by mouth.    [provider]  MISC NATURAL PRODUCTS PO Take 1 capsule by mouth daily. Hawkeye: HEMP Capsules    [provider]  MISC NATURAL PRODUCTS PO Take 1 capsule by mouth daily. Phytoceramides    [provider]  MISC NATURAL PRODUCTS PO Take 1 capsule by mouth daily. Ultimate Friendly Flora Probiotic    [provider]  olmesartan (BENICAR) 20 MG tablet Take 1 tablet (20 mg total) by mouth daily. 08/07/20   Cantwell, Celeste C, PA-C  pravastatin (PRAVACHOL) 10 MG  tablet TAKE 1 TABLET BY MOUTH QD AT BEDTIME 12/02/18   [provider]  TRADJENTA 5 MG TABS tablet TK 1 T PO QD 12/02/18   [provider]  UNABLE TO FIND Take 1 capsule by mouth daily. Med Name: Essential 1 (5000 IU Vit D3, 100 mcg B12, 1200 mcg Biotin, 400 mcg Folate, Lutein, Lycopene, Zeaxanthin)    [provider]  UNABLE TO FIND Take 1 capsule by mouth daily. Med Name: D-Mannose 500    [provider]    Allergies Amlodipine   REVIEW OF SYSTEMS  Negative except as noted here or in the History of Present Illness.   PHYSICAL EXAMINATION  Initial Vital  Signs Blood pressure (!) 159/99, pulse (!) 102, temperature (!) 102.1 F (38.9 C), temperature source Rectal, resp. rate 16, height _0  (1.626 m), weight 73 kg, SpO2 98 %.  Examination General: Well-developed, well-nourished female in no acute distress; appearance consistent with age of record HENT: normocephalic; atraumatic Eyes: pupils equal, round and reactive to light; extraocular muscles intact; arcus senilis bilaterally Neck: supple Heart: Narrow complex tachycardia, which appears to be a junctional rhythm, with occasional sinus beats Lungs: clear to auscultation bilaterally Abdomen: soft; nondistended; nontender; bowel sounds present Extremities: No deformity; full range of motion; trace edema of lower legs Neurologic: Awake, alert and oriented; motor function intact in all extremities and symmetric; no facial droop Skin: Warm and dry Psychiatric: Normal mood and affect   RESULTS  Summary of this visit's results, reviewed and interpreted by myself:   EKG Interpretation  Date/Time:  Saturday Sep 06 2020 00:53:22 EDT Ventricular Rate:  101 PR Interval:    QRS Duration: 118 QT Interval:  380 QTC Calculation: 493 R Axis:   -83 Text Interpretation: Atrial flutter Incomplete RBBB and LAFB Confirmed by Levonne Carreras [phone]) on 09/06/2020 1:50:01 AM      Laboratory Studies: Results for orders placed or performed during the hospital encounter of 09/05/20 (from the past 24 hour(s))  Urinalysis, Routine w reflex microscopic Urine, Catheterized     Status: Abnormal   Collection Time: 09/06/20 12:07 AM  Result Value Ref Range   Color, Urine YELLOW YELLOW   APPearance CLEAR CLEAR   Specific Gravity, Urine 1.023 1.005 - 1.030   pH 7.0 5.0 - 8.0   Glucose, UA >=500 (A) NEGATIVE mg/dL   Hgb urine dipstick SMALL (A) NEGATIVE   Bilirubin Urine NEGATIVE NEGATIVE   Ketones, ur NEGATIVE NEGATIVE mg/dL   Protein, ur >=300 (A) NEGATIVE mg/dL   Nitrite NEGATIVE NEGATIVE   Leukocytes,Ua  NEGATIVE NEGATIVE   RBC / HPF 11-20 0 - 5 RBC/hpf   WBC, UA 0-5 0 - 5 WBC/hpf   Bacteria, UA NONE SEEN NONE SEEN   Mucus PRESENT    Hyaline Casts, UA PRESENT    Non Squamous Epithelial 0-5 (A) NONE SEEN  Comprehensive metabolic panel     Status: Abnormal   Collection Time: 09/06/20 12:07 AM  Result Value Ref Range   Sodium 137 135 - 145 mmol/L   Potassium 3.7 3.5 - 5.1 mmol/L   Chloride 103 98 - 111 mmol/L   CO2 23 22 - 32 mmol/L   Glucose, Bld 245 (H) 70 - 99 mg/dL   BUN 21 8 - 23 mg/dL   Creatinine, Ser 1.65 (H) 0.44 - 1.00 mg/dL   Calcium 9.2 8.9 - 10.3 mg/dL   Total Protein 7.7 6.5 - 8.1 g/dL   Albumin 3.8 3.5 - 5.0 g/dL   AST  47 (H) 15 - 41 U/L   ALT 42 0 - 44 U/L   Alkaline Phosphatase 77 38 - 126 U/L   Total Bilirubin 0.6 0.3 - 1.2 mg/dL   GFR, Estimated 32 (L) >60 mL/min   Anion gap 11 5 - 15  CBC with Differential     Status: Abnormal   Collection Time: 09/06/20 12:07 AM  Result Value Ref Range   WBC 6.9 4.0 - 10.5 K/uL   RBC 3.58 (L) 3.87 - 5.11 MIL/uL   Hemoglobin 10.4 (L) 12.0 - 15.0 g/dL   HCT 33.6 (L) 36.0 - 46.0 %   MCV 93.9 80.0 - 100.0 fL   MCH 29.1 26.0 - 34.0 pg   MCHC 31.0 30.0 - 36.0 g/dL   RDW 14.9 11.5 - 15.5 %   Platelets 144 (L) 150 - 400 K/uL   nRBC 0.0 0.0 - 0.2 %   Neutrophils Relative % 79 %   Neutro Abs 5.4 1.7 - 7.7 K/uL   Lymphocytes Relative 8 %   Lymphs Abs 0.6 (L) 0.7 - 4.0 K/uL   Monocytes Relative 12 %   Monocytes Absolute 0.8 0.1 - 1.0 K/uL   Eosinophils Relative 0 %   Eosinophils Absolute 0.0 0.0 - 0.5 K/uL   Basophils Relative 0 %   Basophils Absolute 0.0 0.0 - 0.1 K/uL   Immature Granulocytes 1 %   Abs Immature Granulocytes 0.04 0.00 - 0.07 K/uL  Protime-INR     Status: None   Collection Time: 09/06/20 12:07 AM  Result Value Ref Range   Prothrombin Time 14.4 11.4 - 15.2 seconds   INR 1.1 0.8 - 1.2  Lactic acid, plasma     Status: Abnormal   Collection Time: 09/06/20 12:08 AM  Result Value Ref Range   Lactic Acid,  Venous 2.2 (HH) 0.5 - 1.9 mmol/L  Resp Panel by RT-PCR (Flu A&B, Covid) Nasopharyngeal Swab     Status: Abnormal   Collection Time: 09/06/20 12:27 AM   Specimen: Nasopharyngeal Swab; Nasopharyngeal(NP) swabs in vial transport medium  Result Value Ref Range   SARS Coronavirus 2 by RT PCR NEGATIVE NEGATIVE   Influenza A by PCR POSITIVE (A) NEGATIVE   Influenza B by PCR NEGATIVE NEGATIVE   Imaging Studies: DG Chest 2 View  Result Date: 09/06/2020 CLINICAL DATA:  Confusion and weakness EXAM: CHEST - 2 VIEW COMPARISON:  04/25/2019 FINDINGS: The heart size and mediastinal contours are within normal limits. Both lungs are clear. The visualized skeletal structures are unremarkable. IMPRESSION: No active cardiopulmonary disease. Electronically Signed   By: Ulyses Jarred M.D.   On: 09/06/2020 01:10    ED COURSE and MDM  Nursing notes, initial and subsequent vitals signs, including pulse oximetry, reviewed and interpreted by myself.  Vitals:   09/06/20 0130 09/06/20 0155 09/06/20 0200 09/06/20 0300  BP: (!) 161/93  (!) 160/101 (!) 148/95  Pulse: 100  (!) 102 92  Resp: (!) 23  (!) 28 20  Temp:  100.1 F (37.8 C)    TempSrc:  Oral    SpO2: 97%  97% 94%  Weight:      Height:       Medications  lactated ringers bolus 1,000 mL (1,000 mLs Intravenous New Bag/Given 09/06/20 0202)   2:03 AM Fever and tachycardia improved.    3:56 AM Monitor still showing irregular heartbeat, likely atrial fibrillation although there continue to be occasional sinus beats.  The patient is positive for influenza A.  We will have her admitted  for this and for her arrhythmia.  PROCEDURES  Procedures   ED DIAGNOSES     ICD-10-CM   1. Influenza A  J10.1   2. Atrial fibrillation, new onset ([place])  I48.91        Terry Gierke, MD 09/06/20 [phone]

## 2020-09-06 NOTE — H&P (Signed)
History and Physical    [name] WUJ:[id number] DOB: March 05, 1941 DOA: 09/05/2020  PCP: Trey Sailors, PA   Patient coming from:  Home  Chief Complaint: Fever, Cough  HPI: [name] is a 80 y.o. female with medical history significant for DMT2, HTN, CKD 3 who presents by EMS for evaluation of generalized weakness, cough, fever and chills. Her son reported to EMS that patient had intermittent confusion yesterday.  No family is with her at this time and she is coherent and alert and oriented x3.  She is able answer all questions appropriately.  She reports a chronic cough that has been worse in the last 1 to 2 days.  Cough is nonproductive.  She has not taken any over-the-counter cough or cold medications.  She had a temperature yesterday to 103 degrees.  Family reported to EMS that patient had "strong smelling urine".  Patient denies any urinary frequency or dysuria.  She denies any chest pain, abdominal pain, headache, neck pain.  She has not had any syncope.  Reports has been compliant with her medications.  Denies any known sick contacts recently.  She did not have a flu or COVID-vaccine she states. Her son lives with her.  She denies tobacco, alcohol, illicit drug use.  ED Course: In the emergency room patient has tachycardia and elevated blood pressure.  Heart rate has been irregular and she is found to have atrial fibrillation on cardiac monitoring.  He is negative for COVID-19 but is positive for influenza A..  Urinalysis is negative.  He is 1.65.  Baseline creatinine is 1.2-1.6.  This 21 which is consistent with her baseline level.  WBC 6.9 hemoglobin 10.4, hematocrit 33.6 and platelets of 144.  Initial lactic acid is 2.2.  Awaiting repeat lactic acid level now Chest x-ray does not show any infiltrate or consolidation.   Review of Systems:  General: Reports fever, chills. Reports generalized weakness. Denies weight loss, night sweats. Denies dizziness.  Denies change in  appetite HENT: Denies head trauma, headache, denies change in hearing, tinnitus. Denies nasal bleeding. Denies sore throat, sores in mouth. Denies difficulty swallowing Eyes: Denies blurry vision, pain in eye, drainage.  Denies discoloration of eyes. Neck: Denies pain.  Denies swelling.  Denies pain with movement. Cardiovascular: Reports palpitations. Denies chest pain.  Denies edema.  Denies orthopnea Respiratory: Reports nonproductive cough. Denies shortness of breath.  Denies wheezing.  Denies sputum production Gastrointestinal: Denies abdominal pain, swelling.  Denies nausea, vomiting, diarrhea.  Denies melena.  Denies hematemesis. Musculoskeletal: Denies limitation of movement. Denies deformity or swelling. Denies arthralgias or myalgias. Genitourinary: Denies pelvic pain. Denies urinary frequency or hesitancy. Denies dysuria.  Skin: Denies rash.  Denies petechiae, purpura, ecchymosis. Neurological: Denies syncope. Denies seizure activity. Denies paresthesia. Denies slurred speech, drooping face.  Denies visual change. Psychiatric: Denies depression, anxiety. Denies hallucinations.  Past Medical History:  Diagnosis Date  . Diabetes mellitus without complication ([place])   . Hypertension     Past Surgical History:  Procedure Laterality Date  . TONSILLECTOMY      Social History  reports that she has never smoked. She has never used smokeless tobacco. She reports that she does not drink alcohol and does not use drugs.  Allergies  Allergen Reactions  . Amlodipine Swelling    History reviewed. No pertinent family history.   Prior to Admission medications   Medication Sig Start Date End Date Taking? Authorizing Provider  Accu-Chek FastClix Lancets MISC See admin instructions. 01/16/19   [provider]  BD PEN NEEDLE NANO 2ND GEN 32G X 4 MM MISC 3 (three) times daily. as directed 08/13/19   [provider]  Blood Glucose Monitoring Suppl (ACCU-CHEK GUIDE ME)  w/Device KIT See admin instructions. 12/11/18   [provider]  Cholecalciferol (VITAMIN D3) 50 MCG (2000 UT) capsule Take 1 capsule by mouth daily. 07/29/14   [provider]  furosemide (LASIX) 40 MG tablet Take 40 mg by mouth in the morning. With additional 40 mg as needed for fluid overload     [provider]  gabapentin (NEURONTIN) 100 MG capsule TK 1 C PO BID 12/12/18   [provider]  glimepiride (AMARYL) 2 MG tablet Take 2 mg by mouth daily. 07/22/20   [provider]  glucose blood test strip TEST BLOOD SUGARS 3 4 TIMES DAILY. 10/27/17   [provider]  hydrALAZINE (APRESOLINE) 50 MG tablet Take 1 tablet (50 mg total) by mouth 3 (three) times daily. 06/06/20   Adrian Prows, MD  insulin lispro (HUMALOG) 100 UNIT/ML KwikPen Inject 10 Units into the skin at bedtime. 08/14/19   [provider]  isosorbide dinitrate (ISORDIL) 40 MG tablet Take 1 tablet (40 mg total) by mouth 3 (three) times daily. 08/07/20   Cantwell, Celeste C, PA-C  Magnesium 250 MG TABS Take by mouth. Patient not taking: Reported on 08/22/2020    [provider]  Misc Natural Products ([place]) TABS Take by mouth.    [provider]  MISC NATURAL PRODUCTS PO Take 1 capsule by mouth daily. Hawkeye: HEMP Capsules    [provider]  MISC NATURAL PRODUCTS PO Take 1 capsule by mouth daily. Phytoceramides    [provider]  MISC NATURAL PRODUCTS PO Take 1 capsule by mouth daily. Ultimate Friendly Flora Probiotic    [provider]  olmesartan (BENICAR) 20 MG tablet Take 1 tablet (20 mg total) by mouth daily. 08/07/20   Cantwell, Celeste C, PA-C  pravastatin (PRAVACHOL) 10 MG tablet TAKE 1 TABLET BY MOUTH QD AT BEDTIME 12/02/18   [provider]  TRADJENTA 5 MG TABS tablet TK 1 T PO QD 12/02/18   [provider]  UNABLE TO FIND Take 1 capsule by mouth daily. Med Name: Essential 1 (5000 IU Vit D3, 100 mcg  B12, 1200 mcg Biotin, 400 mcg Folate, Lutein, Lycopene, Zeaxanthin)    [provider]  UNABLE TO FIND Take 1 capsule by mouth daily. Med Name: [facility] 500    [provider]    Physical Exam: Vitals:   09/06/20 0155 09/06/20 0200 09/06/20 0300 09/06/20 0400  BP:  (!) 160/101 (!) 148/95 (!) 161/116  Pulse:  (!) 102 92 96  Resp:  (!) 28 20 (!) 21  Temp: 100.1 F (37.8 C)     TempSrc: Oral     SpO2:  97% 94% 95%  Weight:      Height:        Constitutional: NAD, calm Vitals:   09/06/20 0155 09/06/20 0200 09/06/20 0300 09/06/20 0400  BP:  (!) 160/101 (!) 148/95 (!) 161/116  Pulse:  (!) 102 92 96  Resp:  (!) 28 20 (!) 21  Temp: 100.1 F (37.8 C)     TempSrc: Oral     SpO2:  97% 94% 95%  Weight:      Height:       General: WDWN, Alert and oriented x3.  Eyes: EOMI, PERRL, conjunctivae normal.  Sclera nonicteric HENT:  [place]/AT, external ears  normal.  Nares patent without epistasis.  Mucous membranes are moist.  Neck: Soft, normal range of motion, supple, no masses, no thyromegaly.  Trachea midline Respiratory: clear to auscultation bilaterally, no wheezing, no crackles. Normal respiratory effort. No accessory muscle use.  Cardiovascular: Irregularly irregular rhythm. Tachycardia., 2/6 Systolic murmur. No rubs / gallops. No extremity edema. 2+ pedal pulses.  Abdomen: Soft, no tenderness, nondistended, no rebound or guarding.  No masses palpated. No hepatosplenomegaly. Bowel sounds normoactive Musculoskeletal: FROM. no cyanosis. No joint deformity upper and lower extremities. Normal muscle tone.  Skin: Warm, dry, intact no rashes, lesions, ulcers. No induration Neurologic: CN 2-12 grossly intact.  Normal speech.  Sensation intact. Strength 5/5 in all extremities.   Psychiatric: Normal judgment and insight.  Normal mood.  CHAD-VASC2 score is 5.     Labs on Admission: I have personally reviewed following labs and imaging studies  CBC: Recent Labs  Lab  09/06/20 0007  WBC 6.9  NEUTROABS 5.4  HGB 10.4*  HCT 33.6*  MCV 93.9  PLT 144*    Basic Metabolic Panel: Recent Labs  Lab 09/06/20 0007  NA 137  K 3.7  CL 103  CO2 23  GLUCOSE 245*  BUN 21  CREATININE 1.65*  CALCIUM 9.2    GFR: Estimated Creatinine Clearance: 27.5 mL/min (A) (by C-G formula based on SCr of 1.65 mg/dL (H)).  Liver Function Tests: Recent Labs  Lab 09/06/20 0007  AST 47*  ALT 42  ALKPHOS 77  BILITOT 0.6  PROT 7.7  ALBUMIN 3.8    Urine analysis:    Component Value Date/Time   COLORURINE YELLOW 09/06/2020 0007   APPEARANCEUR CLEAR 09/06/2020 0007   LABSPEC 1.023 09/06/2020 0007   PHURINE 7.0 09/06/2020 0007   GLUCOSEU >=500 (A) 09/06/2020 0007   HGBUR SMALL (A) 09/06/2020 0007   BILIRUBINUR NEGATIVE 09/06/2020 0007   KETONESUR NEGATIVE 09/06/2020 0007   PROTEINUR >=300 (A) 09/06/2020 0007   NITRITE NEGATIVE 09/06/2020 0007   LEUKOCYTESUR NEGATIVE 09/06/2020 0007    Radiological Exams on Admission: DG Chest 2 View  Result Date: 09/06/2020 CLINICAL DATA:  Confusion and weakness EXAM: CHEST - 2 VIEW COMPARISON:  04/25/2019 FINDINGS: The heart size and mediastinal contours are within normal limits. Both lungs are clear. The visualized skeletal structures are unremarkable. IMPRESSION: No active cardiopulmonary disease. Electronically Signed   By: Ulyses Jarred M.D.   On: 09/06/2020 01:10    EKG: Independently reviewed.  EKG shows atrial fibrillation/flutter with no acute ST elevation or depression.  QTc 493.  On telemetry monitor patient is now in A. fib with heart rate of 108-115  Assessment/Plan Principal Problem:   Atrial fibrillation, new onset  Ms. Hannis is admitted to telemetry floor.  He is given dose of Cardizem 10 mg IV in the emergency room now.  Will be started on Cardizem 30 mg twice daily starting this morning.  Continue to monitor on telemetry. Check serial troponin levels. Obtain echocardiogram in the morning to evaluate wall  motion, EF and valvular function Patient started on Eliquis for anticoagulation.  She is started on 2.5 mg twice a days due to her renal function  Active Problems:   Influenza A Started on Tamiflu twice daily for 5 days.  Patient placed and influenza precautions    Essential hypertension Continue home medication of hydralazine, isosorbide.  Benicar is replaced with Avapro due to hospital formulary.  Monitor blood pressure    Stage 3 chronic kidney disease Monitor renal function with labs in the  morning    DM (diabetes mellitus), type 2 with renal complications Continue Tradjenta.  Blood sugars were monitored with meals and at bedtime.  Sliding scale insulin provided as needed for glycemic control.  Check hemoglobin A1c. Amaryl held while in hospital due to risk of hypoglycemia per guidelines. Continue statin therapy.     Prolonged QT interval Avoid medications that can further prolong QT interval.    DVT prophylaxis: Started on Eliquis for full anticoagulation with a-fib Code Status:   Full code  Family Communication:  Diagnosis and plan discussed with patient.  She verbalized understanding agrees with plan.  Questions answered.  Further recommendations to follow as clinically indicated Disposition Plan:   Patient is from:  Home  Anticipated DC to:  Home  Anticipated DC date:  Anticipate 2 midnights or more stay to treat acute condition  Anticipated DC barriers: No barriers to discharge identified at this time   Admission status:    Inpatient   Yevonne Aline Lariyah Shetterly MD [facility]  How to contact the [facility] Attending or Consulting provider [place] or covering provider during after hours [place], for this patient?   1. Check the care team in [facility] and look for a) attending/consulting [facility] provider listed and b) the [facility] team listed 2. Log into [email or web address] and use [place]'s universal password to access. If you do not have the password, please contact the hospital  operator. 3. Locate the [facility] provider you are looking for under [facility] and page to a number that you can be directly reached. 4. If you still have difficulty reaching the provider, please page the [facility] (Director on Call) for the Hospitalists listed on amion for assistance.  09/06/2020, 4:49 AM

## 2020-09-06 NOTE — Progress Notes (Signed)
  Echocardiogram 2D Echocardiogram has been performed.  [name] 09/06/2020, 2:22 PM

## 2020-09-06 NOTE — ED Notes (Signed)
BREAKFAST TRAY GIVEN 

## 2020-09-06 NOTE — Care Plan (Signed)
This 80 years old female with PMH significant for DM, HTN, CKD stage III presents in the ED for the evaluation of generalized weakness, cough, fever, chills.  Son reported to the EMS that patient had intermittent confusion yesterday.  Patient is awake, alert and able to answer all questions appropriately.  In the ED she is found to have irregularly irregular heart rate, EKG shows atrial fibrillation.  COVID-negative but influenza A +.  UA is negative.  chest x-ray does not show any infiltrate or consolidation. Patient is admitted for new onset atrial fibrillation, patient was given Cardizem 10 mg IV push in the ED,  heart rate is controlled, continued on Cardizem 30 mg every 12 hr,  Eliquis 5 mg twice daily.  Patient was also started on Tamiflu for influenza A . Home blood pressure and diabetic medications were resumed,  There were slightly elevated troponin.  Cardiology was consulted, awaiting recommendation.Patient was seen in the ED.

## 2020-09-07 ENCOUNTER — Encounter (HOSPITAL_COMMUNITY): Payer: Self-pay | Admitting: Family Medicine

## 2020-09-07 DIAGNOSIS — J101 Influenza due to other identified influenza virus with other respiratory manifestations: Secondary | ICD-10-CM

## 2020-09-07 DIAGNOSIS — I10 Essential (primary) hypertension: Secondary | ICD-10-CM

## 2020-09-07 LAB — BASIC METABOLIC PANEL
Anion gap: 14 (ref 5–15)
BUN: 22 mg/dL (ref 8–23)
CO2: 21 mmol/L — ABNORMAL LOW (ref 22–32)
Calcium: 8.7 mg/dL — ABNORMAL LOW (ref 8.9–10.3)
Chloride: 102 mmol/L (ref 98–111)
Creatinine, Ser: 1.28 mg/dL — ABNORMAL HIGH (ref 0.44–1.00)
GFR, Estimated: 43 mL/min — ABNORMAL LOW (ref >60)
Glucose, Bld: 197 mg/dL — ABNORMAL HIGH (ref 70–99)
Potassium: 3.7 mmol/L (ref 3.5–5.1)
Sodium: 137 mmol/L (ref 135–145)

## 2020-09-07 LAB — URINE CULTURE
Culture: <10000 — AB
Special Requests: NORMAL

## 2020-09-07 LAB — CBC
HCT: 33.3 % — ABNORMAL LOW (ref 36.0–46.0)
Hemoglobin: 10.7 g/dL — ABNORMAL LOW (ref 12.0–15.0)
MCH: 29.2 pg (ref 26.0–34.0)
MCHC: 32.1 g/dL (ref 30.0–36.0)
MCV: 90.7 fL (ref 80.0–100.0)
Platelets: 114 10*3/uL — ABNORMAL LOW (ref 150–400)
RBC: 3.67 MIL/uL — ABNORMAL LOW (ref 3.87–5.11)
RDW: 14.7 % (ref 11.5–15.5)
WBC: 5 10*3/uL (ref 4.0–10.5)
nRBC: 0 % (ref 0.0–0.2)

## 2020-09-07 LAB — PHOSPHORUS: Phosphorus: 3 mg/dL (ref 2.5–4.6)

## 2020-09-07 LAB — TSH: TSH: 1.023 u[IU]/mL (ref 0.350–4.500)

## 2020-09-07 LAB — GLUCOSE, CAPILLARY
Glucose-Capillary: 165 mg/dL — ABNORMAL HIGH (ref 70–99)
Glucose-Capillary: 224 mg/dL — ABNORMAL HIGH (ref 70–99)
Glucose-Capillary: 204 mg/dL — ABNORMAL HIGH (ref 70–99)
Glucose-Capillary: 179 mg/dL — ABNORMAL HIGH (ref 70–99)

## 2020-09-07 LAB — MAGNESIUM: Magnesium: 1.7 mg/dL (ref 1.7–2.4)

## 2020-09-07 MED ORDER — APIXABAN 5 MG PO TABS
5.0000 mg | ORAL_TABLET | Freq: Two times a day (BID) | ORAL | Status: DC
  Administered 2020-09-07 – 2020-09-08 (×3): 5 mg via ORAL
  Filled 2020-09-07 (×3): qty 1

## 2020-09-07 MED ORDER — GABAPENTIN 100 MG PO CAPS
100.0000 mg | ORAL_CAPSULE | Freq: Two times a day (BID) | ORAL | Status: DC
  Administered 2020-09-07 – 2020-09-08 (×3): 100 mg via ORAL
  Filled 2020-09-07 (×3): qty 1

## 2020-09-07 MED ORDER — VITAMIN D3 25 MCG (1000 UNIT) PO TABS
2000.0000 [IU] | ORAL_TABLET | Freq: Every day | ORAL | Status: DC
  Administered 2020-09-08: 2000 [IU] via ORAL
  Filled 2020-09-07: qty 2

## 2020-09-07 MED ORDER — DILTIAZEM HCL 30 MG PO TABS
30.0000 mg | ORAL_TABLET | Freq: Three times a day (TID) | ORAL | Status: DC
  Administered 2020-09-07 (×2): 30 mg via ORAL
  Filled 2020-09-07 (×2): qty 1

## 2020-09-07 MED ORDER — IPRATROPIUM-ALBUTEROL 0.5-2.5 (3) MG/3ML IN SOLN
3.0000 mL | RESPIRATORY_TRACT | Status: DC | PRN
  Administered 2020-09-08: 3 mL via RESPIRATORY_TRACT
  Filled 2020-09-07: qty 3

## 2020-09-07 NOTE — Consult Note (Addendum)
CARDIOLOGY CONSULT NOTE  Patient ID: [name] MRN: [id number] DOB/AGE: Sep 12, 1940 80 y.o.  Admit date: 09/05/2020 Attending physician: Jennye Boroughs, MD Primary Physician:  Trey Sailors, [place] Outpatient Cardiologist: Dr. Quay Burow, Dr. Adrian Prows, [place] Inpatient Cardiologist: Rex Kras, DO, [facility]  Chief complaint: Fever, cough  Reason of consultation: New onset of atrial fibrillation & elevated troponins Referring physician: Dr. Shawna Clamp   HPI:  [name] is a 80 y.o. 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  Patient presents to the hospital with a chief complaint of fever and nonproductive cough.  Patient was brought in to the hospital via EMS due to generalized weakness, cough, fever.  Pulmonary work-up noted that she was COVID-19 negative but positive for influenza A infection.  Patient has not been vaccinated for flu or COVID-19 infection.  Cardiology consulted during this hospitalization for new onset of atrial fibrillation and elevated troponin levels.   No prior history of atrial fibrillation.  Patient is asymptomatic and therefore the duration of atrial fibrillation is unknown.  Denies any prior history of gastrointestinal or intracranial bleeding.  Patient denies any chest pain, shortness of breath at rest or with effort related activities.  No change in physical endurance.  ALLERGIES: Allergies  Allergen Reactions  . Amlodipine Swelling    PAST MEDICAL HISTORY: Past Medical History:  Diagnosis Date  . Diabetes mellitus without complication ([place])   . Hypertension     PAST SURGICAL HISTORY: Past Surgical History:  Procedure Laterality Date  .  TONSILLECTOMY      FAMILY HISTORY: No family history of premature CAD.  SOCIAL HISTORY:  The patient  reports that she has never smoked. She has never used smokeless tobacco. She reports that she does not drink alcohol and does not use drugs.  MEDICATIONS: Current Outpatient Medications  Medication Instructions  . Accu-Chek FastClix Lancets MISC See admin instructions  . BD PEN NEEDLE NANO 2ND GEN 32G X 4 MM MISC 3 times daily, as directed  . Blood Glucose Monitoring Suppl (ACCU-CHEK GUIDE ME) w/Device KIT See admin instructions  . Cholecalciferol (VITAMIN D3) 50 MCG (2000 UT) capsule 1 capsule, Oral, Daily  . furosemide (LASIX) 40 mg, Oral, Every morning, With additional 40 mg as needed for fluid overload   . furosemide (LASIX) 20 mg, Oral, 2 times daily  . gabapentin (NEURONTIN) 100 mg, Oral, 2 times daily  . glimepiride (AMARYL) 2 mg, Oral, Daily  . glucose blood test strip TEST BLOOD SUGARS 3 4 TIMES DAILY.  . hydrALAZINE (APRESOLINE) 50 mg, Oral, 3 times daily  . insulin lispro (HUMALOG) 10 Units, Subcutaneous, Nightly  . isosorbide dinitrate (ISORDIL) 40 mg, Oral, 3 times daily  . Magnesium 250 MG TABS 1 tablet, Oral, Daily  . Misc Natural Products (LEG VEIN & CIRCULATION) TABS 1 tablet, Oral, Daily  . MISC NATURAL PRODUCTS PO 1 capsule, Oral, Daily, [place]keye: HEMP Capsules  . MISC NATURAL PRODUCTS PO 1 capsule, Oral, Daily, Phytoceramides  . MISC NATURAL PRODUCTS PO 1 capsule, Oral, Daily, Ultimate Friendly Flora Probiotic  . olmesartan (BENICAR) 20 mg, Oral, Daily  . pravastatin (PRAVACHOL) 10 mg, Oral, Daily  . Tradjenta 5 mg, Oral, Daily  . UNABLE TO FIND 1 capsule, Oral, Daily, Med Name: Essential 1 (5000 IU Vit D3,  100 mcg B12, 1200 mcg Biotin, 400 mcg Folate, Lutein, Lycopene, Zeaxanthin)  . UNABLE TO FIND 1 capsule, Oral, Daily, Med Name: D-Mannose 500    REVIEW OF SYSTEMS: Review of Systems  Constitutional: Positive for fever and malaise/fatigue. Negative for  chills.  HENT: Negative for hoarse voice and nosebleeds.   Eyes: Negative for discharge, double vision and pain.  Cardiovascular: Negative for chest pain, claudication, dyspnea on exertion, leg swelling, near-syncope, orthopnea, palpitations, paroxysmal nocturnal dyspnea and syncope.  Respiratory: Positive for cough. Negative for hemoptysis and shortness of breath.   Musculoskeletal: Negative for muscle cramps and myalgias.  Gastrointestinal: Negative for abdominal pain, constipation, diarrhea, hematemesis, hematochezia, melena, nausea and vomiting.  Neurological: Negative for dizziness and light-headedness.  All other systems reviewed and are negative.   PHYSICAL EXAM: Vitals with BMI 09/07/2020 09/07/2020 09/06/2020  Height - - -  Weight - - -  BMI - - -  Systolic 250 037 048  Diastolic 889 98 65  Pulse 110 109 88     Intake/Output Summary (Last 24 hours) at 09/07/2020 1108 Last data filed at 09/07/2020 0939 Gross per 24 hour  Intake 2264.98 ml  Output 901 ml  Net 1363.98 ml    Net IO Since Admission: 2,363.98 mL [09/07/20 1108]  CONSTITUTIONAL: Age-appropriate female, hemodynamically stable, No acute distress.  SKIN: Skin is warm and dry. No rash noted. No cyanosis. No pallor. No jaundice HEAD: Normocephalic and atraumatic.  EYES: No scleral icterus MOUTH/THROAT: Moist oral membranes.  NECK: No JVD present. No thyromegaly noted. No carotid bruits  LYMPHATIC: No visible cervical adenopathy.  CHEST Normal respiratory effort. No intercostal retractions  LUNGS: Decreased breath sounds bilaterally, rhonchi, No rales.  CARDIOVASCULAR: Irregularly irregular, positive V6-X4, soft holosystolic murmur heard at the left lower sternal border, no gallops or rubs ABDOMINAL: Soft, nontender, distended, positive bowel sounds in all 4 quadrants, no apparent ascites.  EXTREMITIES: No peripheral edema  HEMATOLOGIC: No significant bruising NEUROLOGIC: Oriented to person, place, and time. Nonfocal.  Normal muscle tone.  PSYCHIATRIC: Normal mood and affect. Normal behavior. Cooperative  RADIOLOGY: DG Chest 2 View  Result Date: 09/06/2020 CLINICAL DATA:  Confusion and weakness EXAM: CHEST - 2 VIEW COMPARISON:  04/25/2019 FINDINGS: The heart size and mediastinal contours are within normal limits. Both lungs are clear. The visualized skeletal structures are unremarkable. IMPRESSION: No active cardiopulmonary disease. Electronically Signed   By: Ulyses Jarred M.D.   On: 09/06/2020 01:10   ECHOCARDIOGRAM COMPLETE  Result Date: 09/06/2020    ECHOCARDIOGRAM REPORT   [name] Date of Exam: 09/06/2020 Medical Rec #:  [id number]     Height:       64.0 in Accession #:    [id number]    Weight:       164.7 lb Date of Birth:  04-11-1941     BSA:          1.801 m Patient Age:    82 years      BP:           146/74 mmHg Patient Gender: F             HR:           83 bpm. Exam Location:  Inpatient Procedure: 2D Echo, 3D Echo, Cardiac Doppler and Color Doppler Indications:    I48.91* Unspeicified atrial fibrillation  History:        Patient has no prior history of Echocardiogram examinations.  CHF, Abnormal ECG, Arrythmias:Atrial Fibrillation; Risk                 Factors:Hypertension, Dyslipidemia and Diabetes. Flu.  Sonographer:    Roseanna Rainbow RDCS Referring Phys: [id number] [facility]  Sonographer Comments: Technically difficult study due to poor echo windows. Interrupted by tech to get blood sugar. IMPRESSIONS  1. Left ventricular ejection fraction, by estimation, is 55 to 60%. Left ventricular ejection fraction by 3D volume is 54 %. The left ventricle has normal function. The left ventricle has no regional wall motion abnormalities. Left ventricular diastolic  function could not be evaluated. There is the interventricular septum is flattened in diastole ('D' shaped left ventricle), consistent with right ventricular volume overload.  2. Right ventricular systolic function is  moderately reduced. The right ventricular size is severely enlarged. There is severely elevated pulmonary artery systolic pressure. The estimated right ventricular systolic pressure is 38.3 mmHg.  3. Left atrial size was severely dilated.  4. Right atrial size was severely dilated.  5. The mitral valve is degenerative. Mild to moderate mitral valve regurgitation. Moderate mitral annular calcification.  6. The tricuspid valve is abnormal. There is annular dilation and leaflet malcoaptation. Tricuspid valve regurgitation is severe.  7. The aortic valve is tricuspid. There is mild calcification of the aortic valve. There is moderate thickening of the aortic valve. Aortic valve regurgitation is not visualized. Mild to moderate aortic valve sclerosis/calcification is present, without any evidence of aortic stenosis.  8. The inferior vena cava is dilated in size with <50% respiratory variability, suggesting right atrial pressure of 15 mmHg. FINDINGS  Left Ventricle: Left ventricular ejection fraction, by estimation, is 55 to 60%. Left ventricular ejection fraction by 3D volume is 54 %. The left ventricle has normal function. The left ventricle has no regional wall motion abnormalities. The left ventricular internal cavity size was normal in size. There is no left ventricular hypertrophy. The interventricular septum is flattened in diastole ('D' shaped left ventricle), consistent with right ventricular volume overload. Left ventricular diastolic  function could not be evaluated due to atrial fibrillation. Left ventricular diastolic function could not be evaluated. Right Ventricle: The right ventricular size is severely enlarged. No increase in right ventricular wall thickness. Right ventricular systolic function is moderately reduced. There is severely elevated pulmonary artery systolic pressure. The tricuspid regurgitant velocity is 3.65 m/s, and with an assumed right atrial pressure of 15 mmHg, the estimated right  ventricular systolic pressure is 29.1 mmHg. Left Atrium: Left atrial size was severely dilated. Right Atrium: Right atrial size was severely dilated. Pericardium: There is no evidence of pericardial effusion. Mitral Valve: The mitral valve is degenerative in appearance. Moderate mitral annular calcification. Mild to moderate mitral valve regurgitation, with centrally-directed jet. MV peak gradient, 10.1 mmHg. The mean mitral valve gradient is 3.0 mmHg. Tricuspid Valve: The tricuspid valve is abnormal. Tricuspid valve regurgitation is severe. The flow in the hepatic veins is reversed during ventricular systole. Aortic Valve: The aortic valve is tricuspid. There is mild calcification of the aortic valve. There is moderate thickening of the aortic valve. Aortic valve regurgitation is not visualized. Mild to moderate aortic valve sclerosis/calcification is present, without any evidence of aortic stenosis. Pulmonic Valve: The pulmonic valve was normal in structure. Pulmonic valve regurgitation is mild to moderate. Aorta: The aortic root and ascending aorta are structurally normal, with no evidence of dilitation. Venous: The inferior vena cava is dilated in size with less than 50% respiratory variability, suggesting right atrial  pressure of 15 mmHg. IAS/Shunts: No atrial level shunt detected by color flow Doppler.  LEFT VENTRICLE PLAX 2D LVIDd:         3.20 cm LVIDs:         2.10 cm LV PW:         1.12 cm         3D Volume EF LV IVS:        0.94 cm         LV 3D EF:    Left LVOT diam:     1.70 cm                      ventricular LV SV:         41                           ejection LV SV Index:   23                           fraction by LVOT Area:     2.27 cm                     3D volume                                             is 54 %.  LV Volumes (MOD) LV vol d, MOD    62.6 ml       3D Volume EF: A2C:                           3D EF:        54 % LV vol d, MOD    53.2 ml A4C: LV vol s, MOD    19.2 ml A2C: LV vol s,  MOD    26.8 ml A4C: LV SV MOD A2C:   43.4 ml LV SV MOD A4C:   53.2 ml LV SV MOD BP:    37.0 ml RIGHT VENTRICLE             IVC RV S prime:     10.85 cm/s  IVC diam: 2.50 cm TAPSE (M-mode): 1.8 cm LEFT ATRIUM             Index       RIGHT ATRIUM           Index LA diam:        3.90 cm 2.16 cm/m  RA Area:     24.20 cm LA Vol (A2C):   70.8 ml 39.30 ml/m RA Volume:   80.80 ml  44.85 ml/m LA Vol (A4C):   65.6 ml 36.42 ml/m LA Biplane Vol: 70.4 ml 39.08 ml/m  AORTIC VALVE             PULMONIC VALVE LVOT Vmax:   103.00 cm/s PR End Diast Vel: 2.78 msec LVOT Vmean:  72.500 cm/s LVOT VTI:    0.180 m  AORTA Ao Root diam: 2.70 cm Ao Asc diam:  3.10 cm MITRAL VALVE                 TRICUSPID VALVE MV Area (PHT): 4.06 cm      TR Peak grad:   53.3 mmHg MV  Area VTI:   1.43 cm      TR Vmax:        365.00 cm/s MV Peak grad:  10.1 mmHg MV Mean grad:  3.0 mmHg      SHUNTS MV Vmax:       1.59 m/s      Systemic VTI:  0.18 m MV Vmean:      69.1 cm/s     Systemic Diam: 1.70 cm MV Decel Time: 187 msec MR Peak grad:    131.3 mmHg MR Mean grad:    90.0 mmHg MR Vmax:         573.00 cm/s MR Vmean:        446.0 cm/s MR PISA:         0.57 cm MR PISA Eff ROA: 3 mm MR PISA Radius:  0.30 cm MV E velocity: 140.00 cm/s Mihai Croitoru MD Electronically signed by Sanda Klein MD Signature Date/Time: 09/06/2020/2:27:59 PM    Final     LABORATORY DATA: Lab Results  Component Value Date   WBC 5.0 09/07/2020   HGB 10.7 (L) 09/07/2020   HCT 33.3 (L) 09/07/2020   MCV 90.7 09/07/2020   PLT 114 (L) 09/07/2020    Recent Labs  Lab 09/06/20 0007 09/06/20 0605 09/07/20 0437  NA 137   < > 137  K 3.7   < > 3.7  CL 103   < > 102  CO2 23   < > 21*  BUN 21   < > 22  CREATININE 1.65*   < > 1.28*  CALCIUM 9.2   < > 8.7*  PROT 7.7  --   --   BILITOT 0.6  --   --   ALKPHOS 77  --   --   ALT 42  --   --   AST 47*  --   --   GLUCOSE 245*   < > 197*   < > = values in this interval not displayed.    Lipid Panel  No results found for:  CHOL, TRIG, HDL, CHOLHDL, VLDL, LDLCALC  BNP (last 3 results) Recent Labs    08/08/20 1449  BNP 495.4*    HEMOGLOBIN A1C Lab Results  Component Value Date   HGBA1C 10.4 (H) 09/06/2020   MPG 251.78 09/06/2020    Cardiac Panel (last 3 results) Cardiac Panel (last 3 results) Recent Labs    09/06/20 0605 09/06/20 0724 09/06/20 0924  TROPONINIHS 139* 124* 164*   TSH No results for input(s): TSH in the last 8760 hours.   CARDIAC DATABASE: EKG  09/07/2020: Atrial fibrillation, 104 bpm, right bundle branch block, left anterior fascicular block, poor R wave progression, without underlying injury pattern.  ABI 06/19/2020:  This exam reveals normal perfusion of the right and left lower extremity  (ABI 1.00). Mildly abnormal biphasic waveform noted at the ankles. There  is mild plaque evident in the lower extremity vessels at the ankles.  PCV ECHOCARDIOGRAM COMPLETE 06/19/2020 Left ventricle cavity is normal in size and wall thickness. Normal global wall motion. Normal LV systolic function with EF 55%. Doppler evidence of grade II (pseudonormal) diastolic dysfunction, elevated LAP. Calculated EF 55%. Left atrial cavity is mildly dilated. Right atrial cavity is moderately dilated. Right ventricle cavity is moderately dilated. Moderately reduced right ventricular function. Trileaflet aortic valve with mild aortic valve leaflet calcification. Trace aortic stenosis. Mild (Grade I) aortic regurgitation. Moderate (Grade III) mitral regurgitation. Severe tricuspid regurgitation. Moderate pulmonary hypertension. Estimated pulmonary artery systolic pressure 52  mmHg.  Echo 09/06/2020: 1. Left ventricular ejection fraction, by estimation, is 55 to 60%. Left  ventricular ejection fraction by 3D volume is 54 %. The left ventricle has  normal function. The left ventricle has no regional wall motion  abnormalities. Left ventricular diastolic  function could not be evaluated. There is the  interventricular septum is  flattened in diastole ('D' shaped left ventricle), consistent with right  ventricular volume overload.  2. Right ventricular systolic function is moderately reduced. The right  ventricular size is severely enlarged. There is severely elevated  pulmonary artery systolic pressure. The estimated right ventricular  systolic pressure is 62.9 mmHg.  3. Left atrial size was severely dilated.  4. Right atrial size was severely dilated.  5. The mitral valve is degenerative. Mild to moderate mitral valve  regurgitation. Moderate mitral annular calcification.  6. The tricuspid valve is abnormal. There is annular dilation and leaflet  malcoaptation. Tricuspid valve regurgitation is severe.  7. The aortic valve is tricuspid. There is mild calcification of the  aortic valve. There is moderate thickening of the aortic valve. Aortic  valve regurgitation is not visualized. Mild to moderate aortic valve  sclerosis/calcification is present, without  any evidence of aortic stenosis.  8. The inferior vena cava is dilated in size with <50% respiratory  variability, suggesting right atrial pressure of 15 mmHg.    PCV MYOCARDIAL PERFUSION WITH LEXISCAN 06/11/2020 Nondiagnostic ECG stress. Perfusion imaging study demonstrates decreased uptake in the inferior wall suggestive of soft tissue attenuation.  Ischemia in this region cannot be completely excluded. Overall LV systolic function is normal without regional wall motion abnormalities. Stress LV EF: 82%. Low risk.  IMPRESSION & RECOMMENDATIONS: Maire Govan is a 80 y.o. 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  Impression: Atrial Fibrillation with controlled ventricular  rate  Long-term oral anticoagulation  Elevated troponin -most likely secondary to supply demand ischemia in the setting of A. fib with RVR on presentation Influenza A infection  Influenza A infection  Bifascicular block  Hypertension with chronic kidney disease stage III  Uncontrolled diabetes mellitus type 2  Biatrial enlargement.  Severe tricuspid regurgitation with moderate pulmonary hypertension per echo   Plan: Atrial fibrillation with controlled ventricular rate: Rate control: Cardizem oral Rhythm control: N/A Thromboembolic prophylaxis: Eliquis Nonvalvular A. Fib. Currently asymptomatic most likely secondary to influenza A infection. Echocardiogram notes severe dilatation of the left atrium No prior history of atrial fibrillation. Check TSH Discussed the pathophysiology and management of atrial fibrillation with the patient during today's encounter.  Discussed rate control strategy and thromboembolic prophylaxis for stroke prevention. Ventricular rate is controlled compared to admission.   We will increase Cardizem to 30 mg p.o. 3 times daily for now with a goal heart rate of less than 110 bpm.   Cautious uptitrating AV nodal blocking agents in the setting of underlying bifascicular block. CHA2DS2-VASc SCORE is 5 which correlates to 6.7 % risk of stroke per year (hypertension, age greater than 35, diabetes, gender). Would recommend rate control strategy for now until she has complete resolution of influenza A infection.  If she continues to have atrial fibrillation may consider cardioversion after 4 weeks of oral anticoagulation uninterrupted.  Long-term oral anticoagulation: Given the elevated CHA2DS2-VASc score patient is considered high risk for thromboembolic events. She is already initiated on Eliquis 5 mg p.o.  twice daily, agree. Did discuss with the patient with regards to the risks, benefits, and alternatives to oral anticoagulation.  Patient verbalized  understanding and would like to be on anticoagulation. Patient has been educated on the importance of monitoring for evidence of bleeding which includes but not limited to hemoptysis, hematochezia, melanotic stools, and hematuria. Patient educated on fall precautions and if she gets injured irrespective of mechanism of injury she is to seek medical attention by going to the closest ER since she is on blood thinners.  Patient understands importance of this because if internal bleeding is not treated in a timely manner it may further lead to morbidity and/or mortality.  Patient voices understanding of these recommendations and provides verbal feedback.  Elevated troponins: Patient denies any anginal discomfort. I suspect is most likely secondary to supply demand ischemia in the setting of rapid ventricular rate on presentation. She also had a recent stress test which was noted to be low risk. In the setting of influenza, asymptomatic from cardiac standpoint, would treat her medically for now unless there is change in clinical status or she develops any symptoms.  Patient agrees and verbalizes understanding.   Influenza A infection: Currently being treated by primary team.  Patient not been vaccinated for flu or COVID-19 infection.  Continue management with regards to her chronic comorbid conditions.  Will follow patient with you peripherally.  Please reach out if any questions or concerns arise.  Case discussed with the patient's nurse at bedside.   Total encounter time 87 minutes. *Total Encounter Time as defined by the Centers for Medicare and Medicaid Services includes, in addition to the face-to-face time of a patient visit (documented in the note above) non-face-to-face time: obtaining and reviewing outside history, ordering and reviewing medications, tests or procedures, care coordination (communications with other health care professionals or caregivers) and documentation in the medical  record.  Patient's questions and concerns were addressed to her satisfaction. She voices understanding of the instructions provided during this encounter.   This note was created using a voice recognition software as a result there may be grammatical errors inadvertently enclosed that do not reflect the nature of this encounter. Every attempt is made to correct such errors.  Mechele Claude [facility]  Pager: [phone] Office: [phone] 09/07/2020, 11:08 AM

## 2020-09-07 NOTE — Plan of Care (Signed)

## 2020-09-07 NOTE — Progress Notes (Addendum)
Progress Note    [name]  [id number] DOB: 1940-08-20  DOA: 09/05/2020 PCP: Trey Sailors, PA      Brief Narrative:    Medical records reviewed and are as summarized below:  [name] is a 80 y.o. female       Assessment/Plan:   Principal Problem:   Atrial fibrillation, new onset ([place]) Active Problems:   Essential hypertension   Stage 3 chronic kidney disease ([place])   Influenza A   DM (diabetes mellitus), type 2 with renal complications ([facility])   Prolonged QT interval    Body mass index is 28.27 kg/m.    Influenza A infection: Continue Tamiflu  Atrial fibrillation with RVR: Continue Cardizem and Eliquis.   Chronic diastolic CHF: Compensated.  Hold Lasix because of diarrhea.  2D echo showed EF estimated at 79 to 60%, severely dilated left and right atria, severely enlarged right ventricle, severely elevated pulmonary artery systolic pressure, mild to moderate mitral regurgitation, severe tricuspid regurgitation  Elevated troponins: Likely due to demand ischemia.  She has been evaluated by cardiologist and there is no plan for invasive work-up.  CKD stage IIIb: Creatinine is stable.  Diarrhea: This is probably due to influenza A infection.  Discontinue IV fluids to avoid fluid overload.  Type II DM: Hemoglobin A1c is 10.4.  On MiraLAX on hold.  Other comorbidities include hypertension and type 2 diabetes mellitus.  Diet Order            Diet heart healthy/carb modified Room service appropriate? Yes; Fluid consistency: Thin  Diet effective now                    Consultants:  Cardiologist  Procedures:  None    Medications:   . apixaban  5 mg Oral BID  . diltiazem  30 mg Oral Q8H  . hydrALAZINE  50 mg Oral TID  . insulin aspart  0-5 Units Subcutaneous QHS  . insulin aspart  0-9 Units Subcutaneous TID WC  . irbesartan  150 mg Oral Daily  . isosorbide dinitrate  40 mg Oral TID  . linagliptin  5 mg Oral Daily  .  oseltamivir  30 mg Oral Daily  . pravastatin  20 mg Oral Daily   Continuous Infusions: . lactated ringers 75 mL/hr at 09/07/20 1308     Anti-infectives (From admission, onward)   Start     Dose/Rate Route Frequency Ordered Stop   09/07/20 1000  oseltamivir (TAMIFLU) capsule 30 mg        30 mg Oral Daily 09/06/20 0523 09/12/20 0959   09/06/20 0415  oseltamivir (TAMIFLU) capsule 30 mg        30 mg Oral  Once 09/06/20 0410 09/06/20 0441             Family Communication/Anticipated D/C date and plan/Code Status   DVT prophylaxis:  apixaban (ELIQUIS) tablet 5 mg     Code Status: Full Code  Family Communication: None Disposition Plan:    Status is: Inpatient  Remains inpatient appropriate because:Inpatient level of care appropriate due to severity of illness   Dispo: The patient is from: Home              Anticipated d/c is to: Home              Patient currently is not medically stable to d/c.   Difficult to place patient No           Subjective:  Interval events noted.  She had fever with T-max of 101.1 F this morning.  C/o 1 episode of watery stools this morning.  No vomiting or abdominal pain.  No chest pain, shortness of breath or palpitations.  Objective:    Vitals:   09/06/20 2156 09/06/20 2216 09/07/20 0133 09/07/20 0550  BP: 127/79 123/65 (!) 162/98 (!) 176/104  Pulse: 84 88 (!) 109 (!) 110  Resp: '18 18 20 18  '$ Temp: (!) 101 F (38.3 C) 100 F (37.8 C) 98.6 F (37 C) 98 F (36.7 C)  TempSrc: Oral Oral Oral Oral  SpO2: 98% 95% 94% 96%  Weight:      Height:       No data found.   Intake/Output Summary (Last 24 hours) at 09/07/2020 1337 Last data filed at 09/07/2020 0939 Gross per 24 hour  Intake 2264.98 ml  Output 901 ml  Net 1363.98 ml   Filed Weights   09/05/20 2352 09/06/20 1132  Weight: 73 kg 74.7 kg    Exam:  GEN: NAD SKIN: Warm and dry EYES: EOMI ENT: MMM CV: RRR PULM: CTA B ABD: soft, ND, NT, +BS CNS: AAO x 3,  non focal EXT: No edema or tenderness        Data Reviewed:   I have personally reviewed following labs and imaging studies:  Labs: Labs show the following:   Basic Metabolic Panel: Recent Labs  Lab 09/06/20 0007 09/06/20 0605 09/07/20 0437  NA 137 138 137  K 3.7 4.0 3.7  CL 103 103 102  CO2 23 25 21*  GLUCOSE 245* 199* 197*  BUN '21 21 22  '$ CREATININE 1.65* 1.46* 1.28*  CALCIUM 9.2 9.0 8.7*  MG  --   --  1.7  PHOS  --   --  3.0   GFR Estimated Creatinine Clearance: 35.9 mL/min (A) (by C-G formula based on SCr of 1.28 mg/dL (H)). Liver Function Tests: Recent Labs  Lab 09/06/20 0007  AST 47*  ALT 42  ALKPHOS 77  BILITOT 0.6  PROT 7.7  ALBUMIN 3.8   No results for input(s): LIPASE, AMYLASE in the last 168 hours. No results for input(s): AMMONIA in the last 168 hours. Coagulation profile Recent Labs  Lab 09/06/20 0007  INR 1.1    CBC: Recent Labs  Lab 09/06/20 0007 09/06/20 0605 09/07/20 0437  WBC 6.9 5.9 5.0  NEUTROABS 5.4  --   --   HGB 10.4* 10.2* 10.7*  HCT 33.6* 32.2* 33.3*  MCV 93.9 94.2 90.7  PLT 144* 128* 114*   Cardiac Enzymes: No results for input(s): CKTOTAL, CKMB, CKMBINDEX, TROPONINI in the last 168 hours. BNP (last 3 results) No results for input(s): PROBNP in the last 8760 hours. CBG: Recent Labs  Lab 09/06/20 1406 09/06/20 1719 09/06/20 2156 09/07/20 0741 09/07/20 1144  GLUCAP 195* 215* 156* 165* 224*   D-Dimer: No results for input(s): DDIMER in the last 72 hours. Hgb A1c: Recent Labs    09/06/20 0605  HGBA1C 10.4*   Lipid Profile: No results for input(s): CHOL, HDL, LDLCALC, TRIG, CHOLHDL, LDLDIRECT in the last 72 hours. Thyroid function studies: No results for input(s): TSH, T4TOTAL, T3FREE, THYROIDAB in the last 72 hours.  Invalid input(s): FREET3 Anemia work up: No results for input(s): VITAMINB12, FOLATE, FERRITIN, TIBC, IRON, RETICCTPCT in the last 72 hours. Sepsis Labs: Recent Labs  Lab  09/06/20 0007 09/06/20 0008 09/06/20 0605 09/07/20 0437  WBC 6.9  --  5.9 5.0  LATICACIDVEN  --  2.2*  1.3  --     Microbiology Recent Results (from the past 240 hour(s))  Urine C&S     Status: Abnormal   Collection Time: 09/06/20 12:07 AM   Specimen: Urine, Catheterized  Result Value Ref Range Status   Specimen Description   Final    URINE, CATHETERIZED Performed at [facility] [address] [place], [place] [address]    Special Requests   Final    Normal Performed at [facility], [place] [address] [place], [place] [address]    Culture (A)  Final    <10,000 COLONIES/mL INSIGNIFICANT GROWTH Performed at [facility] [address], [place], [place] [address]    Report Status 09/07/2020 FINAL  Final  Culture, blood (Routine x 2)     Status: None (Preliminary result)   Collection Time: 09/06/20 12:08 AM   Specimen: BLOOD  Result Value Ref Range Status   Specimen Description   Final    BLOOD LEFT WRIST Performed at [facility] [address], [place], [place] [address]    Special Requests   Final    BOTTLES DRAWN AEROBIC AND ANAEROBIC Blood Culture results may not be optimal due to an inadequate volume of blood received in culture bottles Performed at [facility] [address], [place], [place] [address]    Culture   Final    NO GROWTH 1 DAY Performed at [facility], [place] [address] [place], [place] [address]    Report Status PENDING  Incomplete  Culture, blood (Routine x 2)     Status: None (Preliminary result)   Collection Time: 09/06/20 12:13 AM   Specimen: BLOOD  Result Value Ref Range Status   Specimen Description   Final    BLOOD RIGHT ANTECUBITAL Performed at [facility] [address], [place], [place] [address]    Special Requests   Final    BOTTLES DRAWN AEROBIC AND ANAEROBIC Blood Culture results may not be optimal due to an excessive  volume of blood received in culture bottles Performed at [facility] [address] [place], [place] [address]    Culture   Final    NO GROWTH 1 DAY Performed at [facility], [place] [address], [place],  [address]    Report Status PENDING  Incomplete  Resp Panel by RT-PCR (Flu A&B, Covid) Nasopharyngeal Swab     Status: Abnormal   Collection Time: 09/06/20 12:27 AM   Specimen: Nasopharyngeal Swab; Nasopharyngeal(NP) swabs in vial transport medium  Result Value Ref Range Status   SARS Coronavirus 2 by RT PCR NEGATIVE NEGATIVE Final    Comment: (NOTE) SARS-CoV-2 target nucleic acids are NOT DETECTED.  The SARS-CoV-2 RNA is generally detectable in upper respiratory specimens during the acute phase of infection. The lowest concentration of SARS-CoV-2 viral copies this assay can detect is 138 copies/mL. A negative result does not preclude SARS-Cov-2 infection and should not be used as the sole basis for treatment or other patient management decisions. A negative result may occur with  improper specimen collection/handling, submission of specimen other than nasopharyngeal swab, presence of viral mutation(s) within the areas targeted by this assay, and inadequate number of viral copies(<138 copies/mL). A negative result must be combined with clinical observations, patient history, and epidemiological information. The expected result is Negative.  Fact Sheet for Patients:  EntrepreneurPulse.com.au  Fact Sheet for Healthcare Providers:  IncredibleEmployment.be  This test is no t  yet approved or cleared by the [place] and  has been authorized for detection and/or diagnosis of SARS-CoV-2 by FDA under an Emergency Use Authorization (EUA). This EUA will remain  in effect (meaning this test can be used) for the duration of the COVID-19 declaration under Section 564(b)(1) of the Act, 21 U.S.C.section  360bbb-3(b)(1), unless the authorization is terminated  or revoked sooner.       Influenza A by PCR POSITIVE (A) NEGATIVE Final   Influenza B by PCR NEGATIVE NEGATIVE Final    Comment: (NOTE) The Xpert Xpress SARS-CoV-2/FLU/RSV plus assay is intended as an aid in the diagnosis of influenza from Nasopharyngeal swab specimens and should not be used as a sole basis for treatment. Nasal washings and aspirates are unacceptable for Xpert Xpress SARS-CoV-2/FLU/RSV testing.  Fact Sheet for Patients: EntrepreneurPulse.com.au  Fact Sheet for Healthcare Providers: IncredibleEmployment.be  This test is not yet approved or cleared by the [place] FDA and has been authorized for detection and/or diagnosis of SARS-CoV-2 by FDA under an Emergency Use Authorization (EUA). This EUA will remain in effect (meaning this test can be used) for the duration of the COVID-19 declaration under Section 564(b)(1) of the Act, 21 U.S.C. section 360bbb-3(b)(1), unless the authorization is terminated or revoked.  Performed at [facility], [place] [address] [place], [place] [address]     Procedures and diagnostic studies:  DG Chest 2 View  Result Date: 09/06/2020 CLINICAL DATA:  Confusion and weakness EXAM: CHEST - 2 VIEW COMPARISON:  04/25/2019 FINDINGS: The heart size and mediastinal contours are within normal limits. Both lungs are clear. The visualized skeletal structures are unremarkable. IMPRESSION: No active cardiopulmonary disease. Electronically Signed   By: Ulyses Jarred M.D.   On: 09/06/2020 01:10   ECHOCARDIOGRAM COMPLETE  Result Date: 09/06/2020    ECHOCARDIOGRAM REPORT   [name] Date of Exam: 09/06/2020 Medical Rec #:  [id number]     Height:       64.0 in Accession #:    [id number]    Weight:       164.7 lb Date of Birth:  [birth year]     BSA:          1.801 m Patient Age:    75 years      BP:           146/74 mmHg Patient  Gender: F             HR:           83 bpm. Exam Location:  Inpatient Procedure: 2D Echo, 3D Echo, Cardiac Doppler and Color Doppler Indications:    I48.91* Unspeicified atrial fibrillation  History:        Patient has no prior history of Echocardiogram examinations.                 CHF, Abnormal ECG, Arrythmias:Atrial Fibrillation; Risk                 Factors:Hypertension, Dyslipidemia and Diabetes. Flu.  Sonographer:    Roseanna Rainbow RDCS Referring Phys: [id number] [facility]  Sonographer Comments: Technically difficult study due to poor echo windows. Interrupted by tech to get blood sugar. IMPRESSIONS  1. Left ventricular ejection fraction, by estimation, is 55 to 60%. Left ventricular ejection fraction by 3D volume is 54 %. The left ventricle has normal function. The left ventricle has no regional wall motion abnormalities. Left ventricular diastolic  function could not be evaluated.  There is the interventricular septum is flattened in diastole ('D' shaped left ventricle), consistent with right ventricular volume overload.  2. Right ventricular systolic function is moderately reduced. The right ventricular size is severely enlarged. There is severely elevated pulmonary artery systolic pressure. The estimated right ventricular systolic pressure is 123456 mmHg.  3. Left atrial size was severely dilated.  4. Right atrial size was severely dilated.  5. The mitral valve is degenerative. Mild to moderate mitral valve regurgitation. Moderate mitral annular calcification.  6. The tricuspid valve is abnormal. There is annular dilation and leaflet malcoaptation. Tricuspid valve regurgitation is severe.  7. The aortic valve is tricuspid. There is mild calcification of the aortic valve. There is moderate thickening of the aortic valve. Aortic valve regurgitation is not visualized. Mild to moderate aortic valve sclerosis/calcification is present, without any evidence of aortic stenosis.  8. The inferior vena cava is dilated  in size with <50% respiratory variability, suggesting right atrial pressure of 15 mmHg. FINDINGS  Left Ventricle: Left ventricular ejection fraction, by estimation, is 55 to 60%. Left ventricular ejection fraction by 3D volume is 54 %. The left ventricle has normal function. The left ventricle has no regional wall motion abnormalities. The left ventricular internal cavity size was normal in size. There is no left ventricular hypertrophy. The interventricular septum is flattened in diastole ('D' shaped left ventricle), consistent with right ventricular volume overload. Left ventricular diastolic  function could not be evaluated due to atrial fibrillation. Left ventricular diastolic function could not be evaluated. Right Ventricle: The right ventricular size is severely enlarged. No increase in right ventricular wall thickness. Right ventricular systolic function is moderately reduced. There is severely elevated pulmonary artery systolic pressure. The tricuspid regurgitant velocity is 3.65 m/s, and with an assumed right atrial pressure of 15 mmHg, the estimated right ventricular systolic pressure is 123456 mmHg. Left Atrium: Left atrial size was severely dilated. Right Atrium: Right atrial size was severely dilated. Pericardium: There is no evidence of pericardial effusion. Mitral Valve: The mitral valve is degenerative in appearance. Moderate mitral annular calcification. Mild to moderate mitral valve regurgitation, with centrally-directed jet. MV peak gradient, 10.1 mmHg. The mean mitral valve gradient is 3.0 mmHg. Tricuspid Valve: The tricuspid valve is abnormal. Tricuspid valve regurgitation is severe. The flow in the hepatic veins is reversed during ventricular systole. Aortic Valve: The aortic valve is tricuspid. There is mild calcification of the aortic valve. There is moderate thickening of the aortic valve. Aortic valve regurgitation is not visualized. Mild to moderate aortic valve sclerosis/calcification is  present, without any evidence of aortic stenosis. Pulmonic Valve: The pulmonic valve was normal in structure. Pulmonic valve regurgitation is mild to moderate. Aorta: The aortic root and ascending aorta are structurally normal, with no evidence of dilitation. Venous: The inferior vena cava is dilated in size with less than 50% respiratory variability, suggesting right atrial pressure of 15 mmHg. IAS/Shunts: No atrial level shunt detected by color flow Doppler.  LEFT VENTRICLE PLAX 2D LVIDd:         3.20 cm LVIDs:         2.10 cm LV PW:         1.12 cm         3D Volume EF LV IVS:        0.94 cm         LV 3D EF:    Left LVOT diam:     1.70 cm  ventricular LV SV:         41                           ejection LV SV Index:   23                           fraction by LVOT Area:     2.27 cm                     3D volume                                             is 54 %.  LV Volumes (MOD) LV vol d, MOD    62.6 ml       3D Volume EF: A2C:                           3D EF:        54 % LV vol d, MOD    53.2 ml A4C: LV vol s, MOD    19.2 ml A2C: LV vol s, MOD    26.8 ml A4C: LV SV MOD A2C:   43.4 ml LV SV MOD A4C:   53.2 ml LV SV MOD BP:    37.0 ml RIGHT VENTRICLE             IVC RV S prime:     10.85 cm/s  IVC diam: 2.50 cm TAPSE (M-mode): 1.8 cm LEFT ATRIUM             Index       RIGHT ATRIUM           Index LA diam:        3.90 cm 2.16 cm/m  RA Area:     24.20 cm LA Vol (A2C):   70.8 ml 39.30 ml/m RA Volume:   80.80 ml  44.85 ml/m LA Vol (A4C):   65.6 ml 36.42 ml/m LA Biplane Vol: 70.4 ml 39.08 ml/m  AORTIC VALVE             PULMONIC VALVE LVOT Vmax:   103.00 cm/s PR End Diast Vel: 2.78 msec LVOT Vmean:  72.500 cm/s LVOT VTI:    0.180 m  AORTA Ao Root diam: 2.70 cm Ao Asc diam:  3.10 cm MITRAL VALVE                 TRICUSPID VALVE MV Area (PHT): 4.06 cm      TR Peak grad:   53.3 mmHg MV Area VTI:   1.43 cm      TR Vmax:        365.00 cm/s MV Peak grad:  10.1 mmHg MV Mean grad:  3.0 mmHg       SHUNTS MV Vmax:       1.59 m/s      Systemic VTI:  0.18 m MV Vmean:      69.1 cm/s     Systemic Diam: 1.70 cm MV Decel Time: 187 msec MR Peak grad:    131.3 mmHg MR Mean grad:    90.0 mmHg MR Vmax:         573.00 cm/s MR Vmean:  446.0 cm/s MR PISA:         0.57 cm MR PISA Eff ROA: 3 mm MR PISA Radius:  0.30 cm MV E velocity: 140.00 cm/s Dani Gobble Croitoru MD Electronically signed by Sanda Klein MD Signature Date/Time: 09/06/2020/2:27:59 PM    Final                LOS: 1 day   Teresea Donley  [facility]   Pager on [email or web address]. If 7PM-7AM, please contact night-coverage at [email or web address]     09/07/2020, 1:37 PM

## 2020-09-08 LAB — BASIC METABOLIC PANEL
Anion gap: 7 (ref 5–15)
BUN: 21 mg/dL (ref 8–23)
CO2: 27 mmol/L (ref 22–32)
Calcium: 8.4 mg/dL — ABNORMAL LOW (ref 8.9–10.3)
Chloride: 105 mmol/L (ref 98–111)
Creatinine, Ser: 1.3 mg/dL — ABNORMAL HIGH (ref 0.44–1.00)
GFR, Estimated: 42 mL/min — ABNORMAL LOW (ref >60)
Glucose, Bld: 117 mg/dL — ABNORMAL HIGH (ref 70–99)
Potassium: 3.2 mmol/L — ABNORMAL LOW (ref 3.5–5.1)
Sodium: 139 mmol/L (ref 135–145)

## 2020-09-08 LAB — GLUCOSE, CAPILLARY
Glucose-Capillary: 135 mg/dL — ABNORMAL HIGH (ref 70–99)
Glucose-Capillary: 196 mg/dL — ABNORMAL HIGH (ref 70–99)
Glucose-Capillary: 168 mg/dL — ABNORMAL HIGH (ref 70–99)

## 2020-09-08 LAB — MAGNESIUM: Magnesium: 1.8 mg/dL (ref 1.7–2.4)

## 2020-09-08 MED ORDER — POTASSIUM CHLORIDE CRYS ER 20 MEQ PO TBCR
40.0000 meq | EXTENDED_RELEASE_TABLET | ORAL | Status: AC
  Administered 2020-09-08 (×2): 40 meq via ORAL
  Filled 2020-09-08 (×2): qty 2

## 2020-09-08 MED ORDER — APIXABAN 5 MG PO TABS: 5.0000 mg | ORAL_TABLET | Freq: Two times a day (BID) | ORAL | 0 refills | Status: DC

## 2020-09-08 MED ORDER — DILTIAZEM HCL ER COATED BEADS 120 MG PO CP24: 120.0000 mg | ORAL_CAPSULE | Freq: Every day | ORAL | 0 refills | Status: DC

## 2020-09-08 MED ORDER — OSELTAMIVIR PHOSPHATE 30 MG PO CAPS: 30.0000 mg | ORAL_CAPSULE | Freq: Every day | ORAL | 0 refills | Status: AC

## 2020-09-08 MED ORDER — DILTIAZEM HCL ER COATED BEADS 120 MG PO CP24
120.0000 mg | ORAL_CAPSULE | Freq: Every day | ORAL | Status: DC
  Administered 2020-09-08: 120 mg via ORAL
  Filled 2020-09-08: qty 1

## 2020-09-08 MED ORDER — MAGNESIUM OXIDE -MG SUPPLEMENT 400 (240 MG) MG PO TABS
800.0000 mg | ORAL_TABLET | Freq: Once | ORAL | Status: AC
  Administered 2020-09-08: 800 mg via ORAL
  Filled 2020-09-08: qty 2

## 2020-09-08 NOTE — Discharge Summary (Signed)
Physician Discharge Summary  [name] JME:[id number] DOB: [birth year] DOA: 09/05/2020  PCP: Terry Sailors, PA  Admit date: 09/05/2020 Discharge date: 09/08/2020  Discharge disposition: Home   Recommendations for Outpatient Follow-Up:   Follow-up with PCP in 1 week. Follow-up with cardiologist as scheduled.   Discharge Diagnosis:   Principal Problem:   Atrial fibrillation, new onset ([place]) Active Problems:   Essential hypertension   Stage 3 chronic kidney disease (HCC)   Influenza A   DM (diabetes mellitus), type 2 with renal complications (HCC)   Prolonged QT interval    Discharge Condition: Stable.  Diet recommendation:  Diet Order            Diet - low sodium heart healthy           Diet heart healthy/carb modified Room service appropriate? Yes; Fluid consistency: Thin  Diet effective now                   Code Status: Full Code     Hospital Course:   [name] is a 79 year old woman with medical history significant for diabetes mellitus, hypertension, CKD stage IIIb, who presented to the hospital with cough, fever, chills, intermittent confusion and generalized weakness. She was found to have influenza A infection and atrial fibrillation with RVR.  She was treated with Tamiflu, IV Cardizem (subsequently transitioned to oral Cardizem) and Eliquis for stroke prophylaxis.  Risks and benefits of long-term anticoagulation were discussed with the patient and she is agreeable to proceed with treatment.  Cardiologist was consulted to assist with management.  She also had hypokalemia that was repleted.  Her condition has improved and she is deemed stable for discharge to home today.   Medical Consultants:    Cardiologist, Dr. Rex Kras   Discharge Exam:    Vitals:   09/08/20 0418 09/08/20 0426 09/08/20 0857 09/08/20 1254  BP: (!) 142/79 115/72 (!) 142/72 (!) 152/85  Pulse: 80 77  63  Resp: _0 Temp: 98 F (36.7 C) 97.9 F (36.6 C)   98 F (36.7 C)  TempSrc: Oral Oral  Oral  SpO2: 94% 96%  100%  Weight:      Height:         GEN: NAD SKIN: War and dry EYES: EOMI ENT: MMM CV: Irregular rate and rhythm PULM: CTA B ABD: soft, ND, NT, +BS CNS: AAO x 3, non focal EXT: No edema or tenderness   The results of significant diagnostics from this hospitalization (including imaging, microbiology, ancillary and laboratory) are listed below for reference.     Procedures and Diagnostic Studies:   DG Chest 2 View  Result Date: 09/06/2020 CLINICAL DATA:  Confusion and weakness EXAM: CHEST - 2 VIEW COMPARISON:  04/25/2019 FINDINGS: The heart size and mediastinal contours are within normal limits. Both lungs are clear. The visualized skeletal structures are unremarkable. IMPRESSION: No active cardiopulmonary disease. Electronically Signed   By: Ulyses Jarred M.D.   On: 09/06/2020 01:10   ECHOCARDIOGRAM COMPLETE  Result Date: 09/06/2020    ECHOCARDIOGRAM REPORT   [name] Date of Exam: 09/06/2020 Medical Rec #:  [id number]     Height:       64.0 in Accession #:    [id number]    Weight:       164.7 lb Date of Birth:  [birth year]     BSA:          1.801 m Patient Age:  79 years      BP:           146/74 mmHg Patient Gender: F             HR:           83 bpm. Exam Location:  Inpatient Procedure: 2D Echo, 3D Echo, Cardiac Doppler and Color Doppler Indications:    I48.91* Unspeicified atrial fibrillation  History:        Patient has no prior history of Echocardiogram examinations.                 CHF, Abnormal ECG, Arrythmias:Atrial Fibrillation; Risk                 Factors:Hypertension, Dyslipidemia and Diabetes. Flu.  Sonographer:    Roseanna Rainbow RDCS Referring Phys: [id number] [facility]  Sonographer Comments: Technically difficult study due to poor echo windows. Interrupted by tech to get blood sugar. IMPRESSIONS  1. Left ventricular ejection fraction, by estimation, is 55 to 60%. Left ventricular ejection fraction  by 3D volume is 54 %. The left ventricle has normal function. The left ventricle has no regional wall motion abnormalities. Left ventricular diastolic  function could not be evaluated. There is the interventricular septum is flattened in diastole ('D' shaped left ventricle), consistent with right ventricular volume overload.  2. Right ventricular systolic function is moderately reduced. The right ventricular size is severely enlarged. There is severely elevated pulmonary artery systolic pressure. The estimated right ventricular systolic pressure is 63.0 mmHg.  3. Left atrial size was severely dilated.  4. Right atrial size was severely dilated.  5. The mitral valve is degenerative. Mild to moderate mitral valve regurgitation. Moderate mitral annular calcification.  6. The tricuspid valve is abnormal. There is annular dilation and leaflet malcoaptation. Tricuspid valve regurgitation is severe.  7. The aortic valve is tricuspid. There is mild calcification of the aortic valve. There is moderate thickening of the aortic valve. Aortic valve regurgitation is not visualized. Mild to moderate aortic valve sclerosis/calcification is present, without any evidence of aortic stenosis.  8. The inferior vena cava is dilated in size with <50% respiratory variability, suggesting right atrial pressure of 15 mmHg. FINDINGS  Left Ventricle: Left ventricular ejection fraction, by estimation, is 55 to 60%. Left ventricular ejection fraction by 3D volume is 54 %. The left ventricle has normal function. The left ventricle has no regional wall motion abnormalities. The left ventricular internal cavity size was normal in size. There is no left ventricular hypertrophy. The interventricular septum is flattened in diastole ('D' shaped left ventricle), consistent with right ventricular volume overload. Left ventricular diastolic  function could not be evaluated due to atrial fibrillation. Left ventricular diastolic function could not be  evaluated. Right Ventricle: The right ventricular size is severely enlarged. No increase in right ventricular wall thickness. Right ventricular systolic function is moderately reduced. There is severely elevated pulmonary artery systolic pressure. The tricuspid regurgitant velocity is 3.65 m/s, and with an assumed right atrial pressure of 15 mmHg, the estimated right ventricular systolic pressure is 16.0 mmHg. Left Atrium: Left atrial size was severely dilated. Right Atrium: Right atrial size was severely dilated. Pericardium: There is no evidence of pericardial effusion. Mitral Valve: The mitral valve is degenerative in appearance. Moderate mitral annular calcification. Mild to moderate mitral valve regurgitation, with centrally-directed jet. MV peak gradient, 10.1 mmHg. The mean mitral valve gradient is 3.0 mmHg. Tricuspid Valve: The tricuspid valve is abnormal. Tricuspid valve regurgitation is severe.  The flow in the hepatic veins is reversed during ventricular systole. Aortic Valve: The aortic valve is tricuspid. There is mild calcification of the aortic valve. There is moderate thickening of the aortic valve. Aortic valve regurgitation is not visualized. Mild to moderate aortic valve sclerosis/calcification is present, without any evidence of aortic stenosis. Pulmonic Valve: The pulmonic valve was normal in structure. Pulmonic valve regurgitation is mild to moderate. Aorta: The aortic root and ascending aorta are structurally normal, with no evidence of dilitation. Venous: The inferior vena cava is dilated in size with less than 50% respiratory variability, suggesting right atrial pressure of 15 mmHg. IAS/Shunts: No atrial level shunt detected by color flow Doppler.  LEFT VENTRICLE PLAX 2D LVIDd:         3.20 cm LVIDs:         2.10 cm LV PW:         1.12 cm         3D Volume EF LV IVS:        0.94 cm         LV 3D EF:    Left LVOT diam:     1.70 cm                      ventricular LV SV:         41                            ejection LV SV Index:   23                           fraction by LVOT Area:     2.27 cm                     3D volume                                             is 54 %.  LV Volumes (MOD) LV vol d, MOD    62.6 ml       3D Volume EF: A2C:                           3D EF:        54 % LV vol d, MOD    53.2 ml A4C: LV vol s, MOD    19.2 ml A2C: LV vol s, MOD    26.8 ml A4C: LV SV MOD A2C:   43.4 ml LV SV MOD A4C:   53.2 ml LV SV MOD BP:    37.0 ml RIGHT VENTRICLE             IVC RV S prime:     10.85 cm/s  IVC diam: 2.50 cm TAPSE (M-mode): 1.8 cm LEFT ATRIUM             Index       RIGHT ATRIUM           Index LA diam:        3.90 cm 2.16 cm/m  RA Area:     24.20 cm LA Vol (A2C):   70.8 ml 39.30 ml/m RA Volume:   80.80 ml  44.85 ml/m LA Vol (A4C):  65.6 ml 36.42 ml/m LA Biplane Vol: 70.4 ml 39.08 ml/m  AORTIC VALVE             PULMONIC VALVE LVOT Vmax:   103.00 cm/s PR End Diast Vel: 2.78 msec LVOT Vmean:  72.500 cm/s LVOT VTI:    0.180 m  AORTA Ao Root diam: 2.70 cm Ao Asc diam:  3.10 cm MITRAL VALVE                 TRICUSPID VALVE MV Area (PHT): 4.06 cm      TR Peak grad:   53.3 mmHg MV Area VTI:   1.43 cm      TR Vmax:        365.00 cm/s MV Peak grad:  10.1 mmHg MV Mean grad:  3.0 mmHg      SHUNTS MV Vmax:       1.59 m/s      Systemic VTI:  0.18 m MV Vmean:      69.1 cm/s     Systemic Diam: 1.70 cm MV Decel Time: 187 msec MR Peak grad:    131.3 mmHg MR Mean grad:    90.0 mmHg MR Vmax:         573.00 cm/s MR Vmean:        446.0 cm/s MR PISA:         0.57 cm MR PISA Eff ROA: 3 mm MR PISA Radius:  0.30 cm MV E velocity: 140.00 cm/s Dani Gobble Croitoru MD Electronically signed by Sanda Klein MD Signature Date/Time: 09/06/2020/2:27:59 PM    Final      Labs:   Basic Metabolic Panel: Recent Labs  Lab 09/06/20 0007 09/06/20 0605 09/07/20 0437 09/08/20 0437  NA 137 138 137 139  K 3.7 4.0 3.7 3.2*  CL 103 103 102 105  CO2 23 25 21* 27  GLUCOSE 245* 199* 197* 117*  BUN _0 CREATININE 1.65* 1.46* 1.28* 1.30*  CALCIUM 9.2 9.0 8.7* 8.4*  MG  --   --  1.7 1.8  PHOS  --   --  3.0  --    GFR Estimated Creatinine Clearance: 35.3 mL/min (A) (by C-G formula based on SCr of 1.3 mg/dL (H)). Liver Function Tests: Recent Labs  Lab 09/06/20 0007  AST 47*  ALT 42  ALKPHOS 77  BILITOT 0.6  PROT 7.7  ALBUMIN 3.8   No results for input(s): LIPASE, AMYLASE in the last 168 hours. No results for input(s): AMMONIA in the last 168 hours. Coagulation profile Recent Labs  Lab 09/06/20 0007  INR 1.1    CBC: Recent Labs  Lab 09/06/20 0007 09/06/20 0605 09/07/20 0437  WBC 6.9 5.9 5.0  NEUTROABS 5.4  --   --   HGB 10.4* 10.2* 10.7*  HCT 33.6* 32.2* 33.3*  MCV 93.9 94.2 90.7  PLT 144* 128* 114*   Cardiac Enzymes: No results for input(s): CKTOTAL, CKMB, CKMBINDEX, TROPONINI in the last 168 hours. BNP: Invalid input(s): POCBNP CBG: Recent Labs  Lab 09/07/20 1144 09/07/20 1622 09/07/20 2025 09/08/20 0743 09/08/20 1115  GLUCAP 224* 204* 179* 135* 196*   D-Dimer No results for input(s): DDIMER in the last 72 hours. Hgb A1c Recent Labs    09/06/20 0605  HGBA1C 10.4*   Lipid Profile No results for input(s): CHOL, HDL, LDLCALC, TRIG, CHOLHDL, LDLDIRECT in the last 72 hours. Thyroid function studies Recent Labs    09/07/20 1255  TSH 1.023   Anemia work up No results for  input(s): VITAMINB12, FOLATE, FERRITIN, TIBC, IRON, RETICCTPCT in the last 72 hours. Microbiology Recent Results (from the past 240 hour(s))  Urine C&S     Status: Abnormal   Collection Time: 09/06/20 12:07 AM   Specimen: Urine, Catheterized  Result Value Ref Range Status   Specimen Description   Final    URINE, CATHETERIZED Performed at [facility] [address], [place], [place] [address]    Special Requests   Final    Normal Performed at [facility] [address], [place], [place] [address]    Culture (A)  Final    <10,000  COLONIES/mL INSIGNIFICANT GROWTH Performed at [facility] [address], [place], [place] [address]    Report Status 09/07/2020 FINAL  Final  Culture, blood (Routine x 2)     Status: None (Preliminary result)   Collection Time: 09/06/20 12:08 AM   Specimen: BLOOD  Result Value Ref Range Status   Specimen Description   Final    BLOOD LEFT WRIST Performed at [facility] [address], [place], [place] [address]    Special Requests   Final    BOTTLES DRAWN AEROBIC AND ANAEROBIC Blood Culture results may not be optimal due to an inadequate volume of blood received in culture bottles Performed at [facility] [address], [place], [place] [address]    Culture   Final    NO GROWTH 2 DAYS Performed at [facility] [address], [place], [place] [address]    Report Status PENDING  Incomplete  Culture, blood (Routine x 2)     Status: None (Preliminary result)   Collection Time: 09/06/20 12:13 AM   Specimen: BLOOD  Result Value Ref Range Status   Specimen Description   Final    BLOOD RIGHT ANTECUBITAL Performed at [facility] [address] [place], [place] [address]    Special Requests   Final    BOTTLES DRAWN AEROBIC AND ANAEROBIC Blood Culture results may not be optimal due to an excessive volume of blood received in culture bottles Performed at [facility] [address], [place], [place] [address]    Culture   Final    NO GROWTH 2 DAYS Performed at [facility] [address] [place], [place] [address]    Report Status PENDING  Incomplete  Resp Panel by RT-PCR (Flu A&B, Covid) Nasopharyngeal Swab     Status: Abnormal   Collection Time: 09/06/20 12:27 AM   Specimen: Nasopharyngeal Swab; Nasopharyngeal(NP) swabs in vial transport medium  Result Value Ref Range Status   SARS Coronavirus 2 by RT PCR NEGATIVE NEGATIVE Final    Comment: (NOTE) SARS-CoV-2 target nucleic  acids are NOT DETECTED.  The SARS-CoV-2 RNA is generally detectable in upper respiratory specimens during the acute phase of infection. The lowest concentration of SARS-CoV-2 viral copies this assay can detect is 138 copies/mL. A negative result does not preclude SARS-Cov-2 infection and should not be used as the sole basis for treatment or other patient management decisions. A negative result may occur with  improper specimen collection/handling, submission of specimen other than nasopharyngeal swab, presence of viral mutation(s) within the areas targeted by this assay, and inadequate number of viral copies(<138 copies/mL). A negative result must be combined with clinical observations, patient history, and epidemiological information. The expected result is Negative.  Fact Sheet for Patients:  EntrepreneurPulse.com.au  Fact Sheet for Healthcare Providers:  IncredibleEmployment.be  This test is no t yet approved or cleared by the [place] and  has been authorized for detection and/or diagnosis of SARS-CoV-2 by FDA under an Emergency Use Authorization (EUA). This EUA will remain  in effect (meaning this test can be used) for the duration of the COVID-19 declaration under Section 564(b)(1) of the Act, 21 U.S.C.section 360bbb-3(b)(1), unless the authorization is terminated  or revoked sooner.       Influenza A by PCR POSITIVE (A) NEGATIVE Final   Influenza B by PCR NEGATIVE NEGATIVE Final    Comment: (NOTE) The Xpert Xpress SARS-CoV-2/FLU/RSV plus assay is intended as an aid in the diagnosis of influenza from Nasopharyngeal swab specimens and should not be used as a sole basis for treatment. Nasal washings and aspirates are unacceptable for Xpert Xpress SARS-CoV-2/FLU/RSV testing.  Fact Sheet for Patients: EntrepreneurPulse.com.au  Fact Sheet for Healthcare  Providers: IncredibleEmployment.be  This test is not yet approved or cleared by the [place] FDA and has been authorized for detection and/or diagnosis of SARS-CoV-2 by FDA under an Emergency Use Authorization (EUA). This EUA will remain in effect (meaning this test can be used) for the duration of the COVID-19 declaration under Section 564(b)(1) of the Act, 21 U.S.C. section 360bbb-3(b)(1), unless the authorization is terminated or revoked.  Performed at [facility], [place] [address], [place], [place] [address]      Discharge Instructions:   Discharge Instructions    Diet - low sodium heart healthy   Complete by: As directed    Increase activity slowly   Complete by: As directed      Allergies as of 09/08/2020      Reactions   Amlodipine Swelling      Medication List    STOP taking these medications   UNABLE TO FIND   UNABLE TO FIND     TAKE these medications   Accu-Chek FastClix Lancets Misc See admin instructions.   Accu-Chek Guide Me w/Device Kit See admin instructions.   apixaban 5 MG Tabs tablet Commonly known as: ELIQUIS Take 1 tablet (5 mg total) by mouth 2 (two) times daily.   BD Pen Needle Nano 2nd Gen 32G X 4 MM Misc Generic drug: Insulin Pen Needle 3 (three) times daily. as directed   diltiazem 120 MG 24 hr capsule Commonly known as: CARDIZEM CD Take 1 capsule (120 mg total) by mouth daily. Start taking on: Sep 09, 2020   furosemide 20 MG tablet Commonly known as: LASIX Take 20 mg by mouth 2 (two) times daily. What changed: Another medication with the same name was removed. Continue taking this medication, and follow the directions you see here.   gabapentin 100 MG capsule Commonly known as: NEURONTIN Take 100 mg by mouth 2 (two) times daily.   glimepiride 2 MG tablet Commonly known as: AMARYL Take 2 mg by mouth daily.   glucose blood test strip TEST BLOOD SUGARS 3 4 TIMES DAILY.    hydrALAZINE 50 MG tablet Commonly known as: APRESOLINE Take 1 tablet (50 mg total) by mouth 3 (three) times daily.   insulin lispro 100 UNIT/ML KwikPen Commonly known as: HUMALOG Inject 10 Units into the skin at bedtime.   isosorbide dinitrate 40 MG tablet Commonly known as: ISORDIL Take 1 tablet (40 mg total) by mouth 3 (three) times daily.   Leg Vein & Circulation Tabs Take 1 tablet by mouth daily. What changed: Another medication with the same name was removed. Continue taking this medication, and  follow the directions you see here.   MISC NATURAL PRODUCTS PO Take 1 capsule by mouth daily. [place]: HEMP Capsules What changed: Another medication with the same name was removed. Continue taking this medication, and follow the directions you see here.   MISC NATURAL PRODUCTS PO Take 1 capsule by mouth daily. Ultimate Friendly Flora Probiotic What changed: Another medication with the same name was removed. Continue taking this medication, and follow the directions you see here.   Magnesium 250 MG Tabs Take 1 tablet by mouth daily.   olmesartan 20 MG tablet Commonly known as: BENICAR Take 1 tablet (20 mg total) by mouth daily.   oseltamivir 30 MG capsule Commonly known as: TAMIFLU Take 1 capsule (30 mg total) by mouth daily for 2 days. Start taking on: Sep 09, 2020   pravastatin 10 MG tablet Commonly known as: PRAVACHOL Take 10 mg by mouth daily.   Tradjenta 5 MG Tabs tablet Generic drug: linagliptin Take 5 mg by mouth daily.   Vitamin D3 50 MCG (2000 UT) capsule Take 1 capsule by mouth daily.       Follow-up Information    [name], [place]. Go on 09/09/2020.   Specialty: Physician Assistant Contact information: [place] [address] [phone]                Time coordinating discharge: 32 minutes  Signed:  [facility] 09/08/2020, 12:58 PM   Pager on [email or web address]. If 7PM-7AM, please contact  night-coverage at [email or web address]

## 2020-09-08 NOTE — Plan of Care (Signed)
  Problem: Clinical Measurements: Goal: Cardiovascular complication will be avoided Outcome: Progressing   Problem: Activity: Goal: Risk for activity intolerance will decrease Outcome: Progressing   Problem: Coping: Goal: Level of anxiety will decrease Outcome: Progressing   Problem: Clinical Measurements: Goal: Will remain free from infection Outcome: Adequate for Discharge Goal: Respiratory complications will improve Outcome: Adequate for Discharge

## 2020-09-08 NOTE — Plan of Care (Signed)
  Problem: Education: Goal: Knowledge of General Education information will improve Description: Including pain rating scale, medication(s)/side effects and non-pharmacologic comfort measures Outcome: Adequate for Discharge   Problem: Health Behavior/Discharge Planning: Goal: Ability to manage health-related needs will improve Outcome: Adequate for Discharge   Problem: Clinical Measurements: Goal: Ability to maintain clinical measurements within normal limits will improve Outcome: Adequate for Discharge Goal: Will remain free from infection 09/08/2020 1747 by Lennie Hummer, RN Outcome: Adequate for Discharge 09/08/2020 1644 by Lennie Hummer, RN Outcome: Adequate for Discharge Goal: Diagnostic test results will improve Outcome: Adequate for Discharge Goal: Respiratory complications will improve 09/08/2020 1747 by Lennie Hummer, RN Outcome: Adequate for Discharge 09/08/2020 1644 by Lennie Hummer, RN Outcome: Adequate for Discharge Goal: Cardiovascular complication will be avoided 09/08/2020 1747 by Lennie Hummer, RN Outcome: Adequate for Discharge 09/08/2020 1644 by Lennie Hummer, RN Outcome: Progressing   Problem: Activity: Goal: Risk for activity intolerance will decrease 09/08/2020 1747 by Lennie Hummer, RN Outcome: Adequate for Discharge 09/08/2020 1644 by Lennie Hummer, RN Outcome: Progressing   Problem: Nutrition: Goal: Adequate nutrition will be maintained Outcome: Adequate for Discharge   Problem: Coping: Goal: Level of anxiety will decrease 09/08/2020 1747 by Lennie Hummer, RN Outcome: Adequate for Discharge 09/08/2020 1644 by Lennie Hummer, RN Outcome: Progressing   Problem: Elimination: Goal: Will not experience complications related to bowel motility Outcome: Adequate for Discharge Goal: Will not experience complications related to urinary retention Outcome: Adequate for Discharge   Problem: Pain Managment: Goal: General experience of comfort will  improve Outcome: Adequate for Discharge   Problem: Safety: Goal: Ability to remain free from injury will improve Outcome: Adequate for Discharge   Problem: Skin Integrity: Goal: Risk for impaired skin integrity will decrease Outcome: Adequate for Discharge

## 2020-09-11 LAB — CULTURE, BLOOD (ROUTINE X 2)
Culture: NO GROWTH
Culture: NO GROWTH

## 2020-09-12 ENCOUNTER — Telehealth: Payer: Self-pay | Admitting: Podiatry

## 2020-09-12 NOTE — Telephone Encounter (Signed)
Diabetic shoes/inserts in.. lvm for pts daughter to have pt call to schedule an appt to pick them up.. Called pts number multiple times and no voicemail set up.

## 2020-09-23 ENCOUNTER — Other Ambulatory Visit: Payer: Self-pay

## 2020-09-23 ENCOUNTER — Ambulatory Visit (HOSPITAL_COMMUNITY)
Admission: RE | Admit: 2020-09-23 | Discharge: 2020-09-23 | Disposition: A | Discharge status: Home / Self Care | Payer: Medicare HMO | Source: Ambulatory Visit | Attending: Vascular Surgery | Admitting: Vascular Surgery

## 2020-09-23 ENCOUNTER — Ambulatory Visit (INDEPENDENT_AMBULATORY_CARE_PROVIDER_SITE_OTHER): Payer: Medicare HMO | Admitting: Physician Assistant

## 2020-09-23 VITALS — BP 157/80 | HR 83 | Temp 97.6°F | Resp 20 | Ht 64.0 in | Wt 164.8 lb

## 2020-09-23 DIAGNOSIS — R600 Localized edema: Secondary | ICD-10-CM

## 2020-09-23 DIAGNOSIS — I872 Venous insufficiency (chronic) (peripheral): Secondary | ICD-10-CM

## 2020-09-24 ENCOUNTER — Other Ambulatory Visit: Payer: Self-pay

## 2020-09-24 ENCOUNTER — Encounter: Payer: Self-pay | Admitting: Physician Assistant

## 2020-09-24 NOTE — Progress Notes (Signed)
Office Note     CC:  follow up Requesting Provider:  Trey Sailors, PA  HPI: [name] is a 80 y.o. (Dec 04, 1940) female who presents for evaluation of bilateral lower extremity edema and discomfort.  She denies any history of DVT, venous ulcerations, trauma, or prior vascular intervention.  She has tried compression stockings in the past however was unable to tolerate how tight they were.  She has also not been making an effort to elevate her legs during the day.  She denies traditional claudication, rest pain, or nonhealing wounds of bilateral lower extremities.  She is on Eliquis for atrial fibrillation.  She denies tobacco use.  Past medical history also significant for insulin-dependent diabetes mellitus.  Past Medical History:  Diagnosis Date  . Diabetes mellitus without complication ([place])   . Hypertension     Past Surgical History:  Procedure Laterality Date  . TONSILLECTOMY      Social History   Socioeconomic History  . Marital status: Divorced    Spouse name: Not on file  . Number of children: 5  . Years of education: Not on file  . Highest education level: Not on file  Occupational History  . Not on file  Tobacco Use  . Smoking status: Never Smoker  . Smokeless tobacco: Never Used  Vaping Use  . Vaping Use: Never used  Substance and Sexual Activity  . Alcohol use: No  . Drug use: No  . Sexual activity: Not on file  Other Topics Concern  . Not on file  Social History Narrative   4 living children   Social Determinants of Health   Financial Resource Strain: Not on file  Food Insecurity: Not on file  Transportation Needs: Not on file  Physical Activity: Not on file  Stress: Not on file  Social Connections: Not on file  Intimate Partner Violence: Not on file   History reviewed. No pertinent family history.  Current Outpatient Medications  Medication Sig Dispense Refill  . Accu-Chek FastClix Lancets MISC See admin instructions.    Marland Kitchen apixaban  (ELIQUIS) 5 MG TABS tablet Take 1 tablet (5 mg total) by mouth 2 (two) times daily. 60 tablet 0  . BD PEN NEEDLE NANO 2ND GEN 32G X 4 MM MISC 3 (three) times daily. as directed    . Blood Glucose Monitoring Suppl (ACCU-CHEK GUIDE ME) w/Device KIT See admin instructions.    . Cholecalciferol (VITAMIN D3) 50 MCG (2000 UT) capsule Take 1 capsule by mouth daily.    Marland Kitchen diltiazem (CARDIZEM CD) 120 MG 24 hr capsule Take 1 capsule (120 mg total) by mouth daily. 30 capsule 0  . furosemide (LASIX) 20 MG tablet Take 20 mg by mouth 2 (two) times daily.    Marland Kitchen gabapentin (NEURONTIN) 100 MG capsule Take 100 mg by mouth 2 (two) times daily.    Marland Kitchen glimepiride (AMARYL) 2 MG tablet Take 2 mg by mouth daily.    Marland Kitchen glucose blood test strip TEST BLOOD SUGARS 3 4 TIMES DAILY.    . hydrALAZINE (APRESOLINE) 50 MG tablet Take 1 tablet (50 mg total) by mouth 3 (three) times daily. 90 tablet 2  . insulin lispro (HUMALOG) 100 UNIT/ML KwikPen Inject 10 Units into the skin at bedtime.    . isosorbide dinitrate (ISORDIL) 40 MG tablet Take 1 tablet (40 mg total) by mouth 3 (three) times daily. 270 tablet 3  . Misc Natural Products (LEG VEIN & CIRCULATION) TABS Take 1 tablet by mouth daily.    Marland Kitchen  MISC NATURAL PRODUCTS PO Take 1 capsule by mouth daily. Hawkeye: HEMP Capsules    . MISC NATURAL PRODUCTS PO Take 1 capsule by mouth daily. Ultimate Friendly Flora Probiotic    . olmesartan (BENICAR) 20 MG tablet Take 1 tablet (20 mg total) by mouth daily. 90 tablet 3  . pravastatin (PRAVACHOL) 10 MG tablet Take 10 mg by mouth daily.    . TRADJENTA 5 MG TABS tablet Take 5 mg by mouth daily.    . Magnesium 250 MG TABS Take 1 tablet by mouth daily. (Patient not taking: Reported on 09/23/2020)     No current facility-administered medications for this visit.    Allergies  Allergen Reactions  . Amlodipine Swelling     REVIEW OF SYSTEMS:   _0  denotes positive finding, _1  denotes negative finding Cardiac  Comments:  Chest pain or chest  pressure:    Shortness of breath upon exertion:    Short of breath when lying flat:    Irregular heart rhythm:        Vascular    Pain in calf, thigh, or hip brought on by ambulation:    Pain in feet at night that wakes you up from your sleep:     Blood clot in your veins:    Leg swelling:         Pulmonary    Oxygen at home:    Productive cough:     Wheezing:         Neurologic    Sudden weakness in arms or legs:     Sudden numbness in arms or legs:     Sudden onset of difficulty speaking or slurred speech:    Temporary loss of vision in one eye:     Problems with dizziness:         Gastrointestinal    Blood in stool:     Vomited blood:         Genitourinary    Burning when urinating:     Blood in urine:        Psychiatric    Major depression:         Hematologic    Bleeding problems:    Problems with blood clotting too easily:        Skin    Rashes or ulcers:        Constitutional    Fever or chills:      PHYSICAL EXAMINATION:  Vitals:   09/23/20 1400  BP: (!) 157/80  Pulse: 83  Resp: 20  Temp: 97.6 F (36.4 C)  TempSrc: Temporal  SpO2: 100%  Weight: 164 lb 12.8 oz (74.8 kg)  Height: _2  (1.626 m)    General:  WDWN in NAD; vital signs documented above Gait: Not observed HENT: WNL, normocephalic Pulmonary: normal non-labored breathing , without Rales, rhonchi,  wheezing Cardiac: regular HR Abdomen: soft, NT, no masses Skin: without rashes Vascular Exam/Pulses:  Right Left  Radial 2+ (normal) 2+ (normal)  DP  brisk by Doppler Soft by doppler  PT Brisk by doppler Soft by doppler   Extremities: without ischemic changes, without Gangrene , without cellulitis; without open wounds; pigmentation changes of both medial lower legs; no significant edema  BLE Musculoskeletal: no muscle wasting or atrophy  Neurologic: A&O X 3;  No focal weakness or paresthesias are detected Psychiatric:  The pt has Normal affect.   Non-Invasive Vascular Imaging:    Venous Reflux Times  +--------------+---------+------+-----------+------------+--------+  RIGHT     Reflux  NoRefluxReflux TimeDiameter cmsComments               Yes                   +--------------+---------+------+-----------+------------+--------+  CFV      no                         +--------------+---------+------+-----------+------------+--------+  FV mid    no                         +--------------+---------+------+-----------+------------+--------+  Popliteal         yes  >1 second             +--------------+---------+------+-----------+------------+--------+  GSV at [facility]  no               0.47        +--------------+---------+------+-----------+------------+--------+  GSV prox thighno               0.37        +--------------+---------+------+-----------+------------+--------+  GSV mid thigh no               0.32        +--------------+---------+------+-----------+------------+--------+  GSV dist thigh      yes  >500 ms   0.27        +--------------+---------+------+-----------+------------+--------+  GSV at knee  no               0.25        +--------------+---------+------+-----------+------------+--------+  GSV prox calf no               0.22        +--------------+---------+------+-----------+------------+--------+  GSV mid calf no               0.19        +--------------+---------+------+-----------+------------+--------+  SSV Pop Fossa no               0.34        +--------------+---------+------+-----------+------------+--------+  SSV prox calf no               0.32         +--------------+---------+------+-----------+------------+--------+  SSV mid calf no               0.26        +--------------+---------+------+-----------+------------+--------+     +--------------+---------+------+-----------+------------+--------+  LEFT     Reflux NoRefluxReflux TimeDiameter cmsComments               Yes                   +--------------+---------+------+-----------+------------+--------+  CFV            yes  >1 second             +--------------+---------+------+-----------+------------+--------+  FV mid    no                         +--------------+---------+------+-----------+------------+--------+  Popliteal   no                         +--------------+---------+------+-----------+------------+--------+  GSV at [facility]  no               0.68        +--------------+---------+------+-----------+------------+--------+  GSV prox thigh      yes  >500 ms   0.62        +--------------+---------+------+-----------+------------+--------+  GSV mid thigh       yes  >500 ms   0.33        +--------------+---------+------+-----------+------------+--------+  GSV dist thighno               0.29        +--------------+---------+------+-----------+------------+--------+  GSV at knee  no               0.30        +--------------+---------+------+-----------+------------+--------+  GSV prox calf no               0.23        +--------------+---------+------+-----------+------------+--------+  GSV mid calf no               0.17        +--------------+---------+------+-----------+------------+--------+  SSV Pop Fossa no                0.52        +--------------+---------+------+-----------+------------+--------+  SSV prox calf no               0.34        +--------------+---------+------+-----------+------------+--------+  SSV mid calf no               0.36      ASSESSMENT/PLAN:: 80 y.o. 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  Despite this she has Doppler signals onto the foot bilaterally and is asymptomatic.  No indication for further work-up of possible arterial disease -Patient knows to follow-up if she has any problems or concerns in the future   Dagoberto Ligas, PA-C [facility] [phone]  Clinic MD:   Dr. Carlis Abbott

## 2020-09-27 LAB — BASIC METABOLIC PANEL
BUN/Creatinine Ratio: 13 (ref 12–28)
BUN: 21 mg/dL (ref 8–27)
CO2: 24 mmol/L (ref 20–29)
Calcium: 9.2 mg/dL (ref 8.7–10.3)
Chloride: 101 mmol/L (ref 96–106)
Creatinine, Ser: 1.59 mg/dL — ABNORMAL HIGH (ref 0.57–1.00)
Glucose: 244 mg/dL — ABNORMAL HIGH (ref 65–99)
Potassium: 5 mmol/L (ref 3.5–5.2)
Sodium: 137 mmol/L (ref 134–144)
eGFR: 33 mL/min/{1.73_m2} — ABNORMAL LOW (ref >59)

## 2020-09-27 LAB — BRAIN NATRIURETIC PEPTIDE: BNP: 321.4 pg/mL — ABNORMAL HIGH (ref 0.0–100.0)

## 2020-09-30 ENCOUNTER — Telehealth: Payer: Self-pay | Admitting: Pharmacist

## 2020-09-30 NOTE — Telephone Encounter (Signed)
Reviewed recent lab results with pt and Celeste. BNP noted to be trending down compared to labs from 08/08/20. Mild increase in renal function and elevated BG. Diabetes managed by PCP. Pt currently taking lasix 40 mg daily. Per Celeste, okay to decrease lasix to PRN use moving forward. Encouraged pt to increase her fluid intake and avoiding any ETOH or NSAID use. Pt has an OV scheduled for 10/02/20.

## 2020-09-30 NOTE — Progress Notes (Signed)
Reviewed and discussed with [name]. Patient will reduce lasix to PRN dosing as renal function has deteriorated some.

## 2020-10-01 NOTE — Progress Notes (Deleted)
Primary Physician/Referring:  Trey Sailors, PA  Patient ID: [name], female    DOB: 04-Mar-1941, 80 y.o.   MRN: [id number]  No chief complaint on file.  HPI:    [name]  is a 80 y.o. 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  Patient was recently hospitalized 09/06/2020 - 09/08/2020 with influenza.  During hospitalization patient developed new onset atrial fibrillation with elevated troponin levels.  Recommended rate control strategy given presentation of acute influenza infection, with recommendation that if atrial fibrillation continued could consider cardioversion following 4 weeks of anticoagulation.  Given patient's CHA2DS2-VASc score of 5 she was started on Eliquis.  Elevation of troponin was likely related to supply demand ischemia in the setting of rapid ventricular response and acute influenza infection.  Patient was discharged on Eliquis and diltiazem.  ***She now presents for follow up.  Since last visit repeat BMP revealed mild reduction in renal function, therefore advised patient to switch Lasix from daily to as needed.  ***Lasix?   Patient presents for 2-week follow-up of uncontrolled hypertension.  At last visit increased isosorbide dinitrate to 40 mg 3 times daily and added olmesartan 20 mg daily.  Repeat BMP showed mildly reduced renal function, however BNP was also elevated.  Therefore advised patient to continue adjusted medications with out that olmesartan may improve BNP.  Ordered repeat BNP and BMP prior to today's office visit, unfortunately this was not done.  Also upon further questioning patient states she has not been taking Benicar, she never picked it up from the pharmacy.  She does report she has now consistently taking BiDil 3 times daily.  She remains  asymptomatic.  Denies chest pain, palpitations, syncope, near syncope, orthopnea, PND.  She has had no further bilateral lower leg edema.  Past Medical History:  Diagnosis Date  . Diabetes mellitus without complication ([place])   . Hypertension    Past Surgical History:  Procedure Laterality Date  . TONSILLECTOMY     No family history on file.  No history of premature coronary artery disease or sudden cardiac death in her family. Social History   Tobacco Use  . Smoking status: Never Smoker  . Smokeless tobacco: Never Used  Substance Use Topics  . Alcohol use: No   Marital Status: Divorced  ROS  Review of Systems  Constitutional: Negative for malaise/fatigue and weight gain.  Eyes: Negative for visual disturbance.  Cardiovascular: Negative for chest pain, claudication, dyspnea on exertion (imrpoved), leg swelling (Resolved), near-syncope, orthopnea, palpitations, paroxysmal nocturnal dyspnea and syncope.  Respiratory: Negative for shortness of breath.   Hematologic/Lymphatic: Does not bruise/bleed easily.  Musculoskeletal: Positive for muscle cramps (leg cramps at night).  Gastrointestinal: Negative for melena.  Neurological: Negative for dizziness, headaches, light-headedness and weakness.   Objective  There were no vitals taken for this visit.  Vitals with BMI 09/23/2020 09/08/2020 09/08/2020  Height '5\' 4"'  - -  Weight 164 lbs 13 oz - -  BMI 32.35 - -  Systolic 573 220 254  Diastolic 80 85 72  Pulse 83 63 -     Physical Exam Constitutional:      Comments: Mildly obese in no acute distress.  Cardiovascular:     Rate and Rhythm: Normal rate and regular rhythm.     Pulses:          Carotid pulses  are 2+ on the right side and 2+ on the left side.      Femoral pulses are 2+ on the right side and 2+ on the left side.      Dorsalis pedis pulses are 2+ on the right side and 1+ on the left side.       Posterior tibial pulses are 2+ on the right side and 0 on the left side.      Heart sounds: Normal heart sounds. No murmur heard. No gallop.      Comments: Popliteal pulse difficult to feel due to patient's body habitus.  No JVD.   Pulmonary:     Effort: Pulmonary effort is normal.     Breath sounds: Normal breath sounds. No wheezing or rales.  Abdominal:     Comments: Obese. Pannus present  Musculoskeletal:     Right lower leg: No edema.     Left lower leg: No edema.  Skin:    General: Skin is warm and dry.     Comments: Hair loss and atrophic changes bilateral lower legs.  Neurological:     General: No focal deficit present.     Mental Status: She is alert and oriented to person, place, and time.    Laboratory examination:   Recent Labs    09/06/20 0605 09/07/20 0437 09/08/20 0437 09/26/20 1413  NA 138 137 139 137  K 4.0 3.7 3.2* 5.0  CL 103 102 105 101  CO2 25 21* 27 24  GLUCOSE 199* 197* 117* 244*  BUN '21 22 21 21  ' CREATININE 1.46* 1.28* 1.30* 1.59*  CALCIUM 9.0 8.7* 8.4* 9.2  GFRNONAA 37* 43* 42*  --    estimated creatinine clearance is 28.9 mL/min (A) (by Marquez-G formula based on SCr of 1.59 mg/dL (H)).  CMP Latest Ref Rng & Units 09/26/2020 09/08/2020 09/07/2020  Glucose 65 - 99 mg/dL 244(H) 117(H) 197(H)  BUN 8 - 27 mg/dL '21 21 22  ' Creatinine 0.57 - 1.00 mg/dL 1.59(H) 1.30(H) 1.28(H)  Sodium 134 - 144 mmol/L 137 139 137  Potassium 3.5 - 5.2 mmol/L 5.0 3.2(L) 3.7  Chloride 96 - 106 mmol/L 101 105 102  CO2 20 - 29 mmol/L 24 27 21(L)  Calcium 8.7 - 10.3 mg/dL 9.2 8.4(L) 8.7(L)  Total Protein 6.5 - 8.1 g/dL - - -  Total Bilirubin 0.3 - 1.2 mg/dL - - -  Alkaline Phos 38 - 126 U/L - - -  AST 15 - 41 U/L - - -  ALT 0 - 44 U/L - - -   CBC Latest Ref Rng & Units 09/07/2020 09/06/2020 09/06/2020  WBC 4.0 - 10.5 K/uL 5.0 5.9 6.9  Hemoglobin 12.0 - 15.0 g/dL 10.7(L) 10.2(L) 10.4(L)  Hematocrit 36.0 - 46.0 % 33.3(L) 32.2(L) 33.6(L)  Platelets 150 - 400 K/uL 114(L) 128(L) 144(L)    Lipid Panel No results for input(s): CHOL, TRIG, LDLCALC, VLDL, HDL,  CHOLHDL, LDLDIRECT in the last 8760 hours.  HEMOGLOBIN A1C Lab Results  Component Value Date   HGBA1C 10.4 (H) 09/06/2020   MPG 251.78 09/06/2020   TSH Recent Labs    09/07/20 1255  TSH 1.023    BNP    Component Value Date/Time   BNP 321.4 (H) 09/26/2020 1407   BNP 628.4 (H) 04/25/2019 1410    ProBNP No results found for: PROBNP  External labs:   05/15/2020: Serum glucose 184 mg, BUN 26, creatinine 1.60, EGFR 35 mL, sodium 137, potassium 4.3, CMP otherwise normal. A1c 10.0%. BNP 08/10/1975.  Total cholesterol 224, triglycerides 46, HDL 113, LDL 103.  Non-HDL cholesterol 111. Labs 10/20/2016: TSH normal. Allergies   Allergies  Allergen Reactions  . Amlodipine Swelling      Medications Prior to Visit:   Outpatient Medications Prior to Visit  Medication Sig Dispense Refill  . Accu-Chek FastClix Lancets MISC See admin instructions.    Marland Kitchen apixaban (ELIQUIS) 5 MG TABS tablet Take 1 tablet (5 mg total) by mouth 2 (two) times daily. 60 tablet 0  . BD PEN NEEDLE NANO 2ND GEN 32G X 4 MM MISC 3 (three) times daily. as directed    . Blood Glucose Monitoring Suppl (ACCU-CHEK GUIDE ME) w/Device KIT See admin instructions.    . Cholecalciferol (VITAMIN D3) 50 MCG (2000 UT) capsule Take 1 capsule by mouth daily.    Marland Kitchen diltiazem (CARDIZEM CD) 120 MG 24 hr capsule Take 1 capsule (120 mg total) by mouth daily. 30 capsule 0  . furosemide (LASIX) 40 MG tablet Take 40 mg by mouth daily as needed for fluid.    Marland Kitchen gabapentin (NEURONTIN) 100 MG capsule Take 100 mg by mouth 2 (two) times daily.    Marland Kitchen glimepiride (AMARYL) 2 MG tablet Take 2 mg by mouth daily.    Marland Kitchen glucose blood test strip TEST BLOOD SUGARS 3 4 TIMES DAILY.    . hydrALAZINE (APRESOLINE) 50 MG tablet Take 1 tablet (50 mg total) by mouth 3 (three) times daily. 90 tablet 2  . insulin lispro (HUMALOG) 100 UNIT/ML KwikPen Inject 10 Units into the skin at bedtime.    . isosorbide dinitrate (ISORDIL) 40 MG tablet Take 1 tablet (40 mg  total) by mouth 3 (three) times daily. 270 tablet 3  . Magnesium 250 MG TABS Take 1 tablet by mouth daily. (Patient not taking: Reported on 09/23/2020)    . Misc Natural Products (LEG VEIN & CIRCULATION) TABS Take 1 tablet by mouth daily.    Marland Kitchen MISC NATURAL PRODUCTS PO Take 1 capsule by mouth daily. Hawkeye: HEMP Capsules    . MISC NATURAL PRODUCTS PO Take 1 capsule by mouth daily. Ultimate Friendly Flora Probiotic    . olmesartan (BENICAR) 20 MG tablet Take 1 tablet (20 mg total) by mouth daily. 90 tablet 3  . pravastatin (PRAVACHOL) 10 MG tablet Take 10 mg by mouth daily.    . TRADJENTA 5 MG TABS tablet Take 5 mg by mouth daily.     No facility-administered medications prior to visit.     Final Medications at End of Visit    No outpatient medications have been marked as taking for the 10/02/20 encounter (Appointment) with Rayetta Pigg, Terry Lindenbaum C, PA-Marquez.     Radiology:   Chest x-ray single view 04/25/2019: Mediastinum hilar structures normal. Cardiomegaly. Mild pulmonary venous congestion bilateral interstitial prominence. Findings suggest mild CHF.  Calcified pulmonary nodules noted consistent with old calcified granulomas. Small right pleural effusion cannot be Excluded.  Cardiac Studies:   ABI 06/19/2020:  This exam reveals normal perfusion of the right and left lower extremity  (ABI 1.00). Mildly abnormal biphasic waveform noted at the ankles. There  is mild plaque evident in the lower extremity vessels at the ankles.  PCV ECHOCARDIOGRAM COMPLETE 06/19/2020 Left ventricle cavity is normal in size and wall thickness. Normal global wall motion. Normal LV systolic function with EF 55%. Doppler evidence of grade II (pseudonormal) diastolic dysfunction, elevated LAP. Calculated EF 55%. Left atrial cavity is mildly dilated. Right atrial cavity is moderately dilated. Right ventricle cavity is moderately dilated. Moderately  reduced right ventricular function. Trileaflet aortic valve with  mild aortic valve leaflet calcification. Trace aortic stenosis. Mild (Grade I) aortic regurgitation. Moderate (Grade III) mitral regurgitation. Severe tricuspid regurgitation. Moderate pulmonary hypertension. Estimated pulmonary artery systolic pressure 52 mmHg.   PCV MYOCARDIAL PERFUSION WITH LEXISCAN 06/11/2020 Nondiagnostic ECG stress. Perfusion imaging study demonstrates decreased uptake in the inferior wall suggestive of soft tissue attenuation.  Ischemia in this region cannot be completely excluded. Overall LV systolic function is normal without regional wall motion abnormalities. Stress LV EF: 82%. Low risk.   EKG:  ***   09/07/2020: Atrial fibrillation, 104 bpm, right bundle branch block, left anterior fascicular block, poor R wave progression, without underlying injury pattern.  EKG 03/19/2020: Sinus rhythm with first-degree AV block at rate of 80 bpm, left axis deviation, left anterior fascicular block.  Right bundle branch block.  Trifascicular block.  Assessment   No diagnosis found.   There are no discontinued medications.  No orders of the defined types were placed in this encounter.  Recommendations:   [name] is a 80 y.o. 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  Patient was recently hospitalized 09/06/2020 - 09/08/2020 with influenza.  During hospitalization patient developed new onset atrial fibrillation with elevated troponin levels.  Recommended rate control strategy given presentation of acute influenza infection, with recommendation that if atrial fibrillation continued could consider cardioversion following 4 weeks of anticoagulation.  Given patient's CHA2DS2-VASc score of 5 she was started on Eliquis.  Elevation of troponin was likely related to supply demand  ischemia in the setting of rapid ventricular response and acute influenza infection.  Patient was discharged on Eliquis and diltiazem.  ***She now presents for follow up.  Since last visit repeat BMP revealed mild reduction in renal function, therefore advised patient to switch Lasix from daily to as needed.  ***Lasix?   ***   Patient underwent cardiac work-up in February 2022.Ankle-brachial index revealed normal perfusion of bilateral lower extremities with mild plaque evident at the ankles.  Echocardiogram revealed normal LVEF at 16%, grade 2 diastolic dysfunction, dilated LA and RA as well as moderate mitral regurgitation, severe tricuspid regurgitation and moderate pulmonary hypertension, likely group 2.  Nuclear stress test was low risk with normal LV systolic function and no regional wall motion abnormalities, however ischemia in the inferior wall cannot be excluded.  Patient presents for 2-week follow-up of uncontrolled hypertension.  At last visit increased isosorbide dinitrate to 40 mg 3 times daily and added olmesartan 20 mg daily.  Repeat BMP showed mildly reduced renal function, however BNP was also elevated.  Therefore advised patient to continue adjusted medications with out that olmesartan may improve BNP.  Ordered repeat BNP and BMP prior to today's office visit, unfortunately it was not done.   In view of elevated BNP advised patient to increase Lasix to 40 mg twice daily for 3 days.  Patient will then return to taking Lasix 40 mg once daily.  Also advised patient to start telmisartan as her blood pressure remains uncontrolled.  We will plan to repeat BNP and BMP in 1 week.  Otherwise will not make changes to patient's medications at this time.  As patient seems difficulty with medication compliance and blood pressure remains uncontrolled, patient is enrolled with our office in remote monitoring of blood pressure.  She will work with our office  clinical pharmacist.  Notably will  continue to avoid AV nodal blocking agents in view of underlying bifascicular block and advanced age.  Again discussed at length regarding importance of diet and medication compliance.  Patient continues to admit to high sodium intake.  Follow-up in 6 weeks, sooner if needed, for hypertension and diastolic heart failure.   Terry Berthold, PA-Marquez 10/01/2020, 5:01 PM Office: [phone]

## 2020-10-02 ENCOUNTER — Ambulatory Visit: Payer: Medicare Other | Admitting: Student

## 2020-10-02 ENCOUNTER — Other Ambulatory Visit: Payer: Medicare HMO

## 2020-10-02 DIAGNOSIS — I4891 Unspecified atrial fibrillation: Secondary | ICD-10-CM

## 2020-10-02 DIAGNOSIS — I10 Essential (primary) hypertension: Secondary | ICD-10-CM

## 2020-10-16 ENCOUNTER — Ambulatory Visit: Payer: Medicare HMO | Admitting: Student

## 2020-10-16 ENCOUNTER — Other Ambulatory Visit: Payer: Self-pay

## 2020-10-16 ENCOUNTER — Encounter: Payer: Self-pay | Admitting: Student

## 2020-10-16 ENCOUNTER — Inpatient Hospital Stay: Payer: Medicare HMO

## 2020-10-16 VITALS — BP 120/68 | HR 104 | Temp 97.8°F | Ht 64.0 in | Wt 163.0 lb

## 2020-10-16 DIAGNOSIS — I10 Essential (primary) hypertension: Secondary | ICD-10-CM

## 2020-10-16 DIAGNOSIS — I480 Paroxysmal atrial fibrillation: Secondary | ICD-10-CM

## 2020-10-16 DIAGNOSIS — I4891 Unspecified atrial fibrillation: Secondary | ICD-10-CM

## 2020-10-16 NOTE — Patient Instructions (Signed)
Go to [facility] for labs in 1 week.

## 2020-10-16 NOTE — Progress Notes (Addendum)
Primary Physician/Referring:  Trey Sailors, PA  Patient ID: [name], female    DOB: [birth year], 80 y.o.   MRN: [id number]  Chief Complaint  Patient presents with   Hospitalization Follow-up   Atrial Fibrillation   HPI:    [name]  is a 80 y.o. African-American female with history of hypertension, uncontrolled diabetes mellitus with stage IIIb chronic kidney disease, who has been referred to me for evaluation of leg edema, pulmonary hypertension and dyspnea on exertion. Patient was recently hospitalized 09/06/2020 - 09/08/2020 with influenza. During hospitalization patient developed new onset atrial fibrillation with elevated troponin levels. Recommended rate control strategy given presentation of acute influenza infection, with recommendation that if atrial fibrillation continued could consider cardioversion following 4 weeks of anticoagulation. Given patient's CHA2DS2-VASc score of 5 she was started on Eliquis. Elevation of troponin was likely related to supply demand ischemia in the setting of rapid ventricular response and acute influenza infection. Patient was discharged on Eliquis and diltiazem. She now presents for follow up. Since last visit repeat BMP revealed mild reduction in renal function, therefore advised patient to switch Lasix from daily to as needed. Although upon further questioning it is unclear whether patient has indeed switched Lasix from daily to as needed dosing. Patient was ports overall she has improved significantly since hospitalization. Her primary concern is intermittent right arm numbness at night. She has had no known recurrence of atrial fibrillation since discharge last month. Denies chest pain, palpitations, syncope, near syncope, dizziness. She is presently tolerating anticoagulation without bleeding diathesis. hyperlipidemia, who has been referred to me for evaluation of leg edema, pulmonary hypertension and dyspnea on exertion.  Patient was recently hospitalized 09/06/2020 - 09/08/2020 with influenza.  During hospitalization patient developed new onset atrial fibrillation with elevated troponin levels.  Recommended rate control strategy given presentation of acute influenza infection, with recommendation that if atrial fibrillation continued could consider cardioversion following 4 weeks of anticoagulation.  Given patient's CHA2DS2-VASc score of 5 she was started on Eliquis.  Elevation of troponin was likely related to supply demand ischemia in the setting of rapid ventricular response and acute influenza infection.  Patient was discharged on Eliquis and diltiazem.  She now presents for follow up.  Since last visit repeat BMP revealed mild reduction in renal function, therefore advised patient to switch Lasix from daily to as needed.  Although upon further questioning it is unclear whether patient has indeed switched Lasix from daily to as needed dosing.  Patient was ports overall she has improved significantly since hospitalization.  Her primary concern is intermittent right arm numbness at night.  She has had no known recurrence of atrial fibrillation since discharge last month.  Denies chest pain, palpitations, syncope, near syncope, dizziness. She is presently tolerating anticoagulation without bleeding diathesis.   Past Medical History:  Diagnosis Date   Diabetes mellitus without complication ([place])    Hypertension     Past Surgical History:  Procedure Laterality Date   TONSILLECTOMY     History reviewed. No pertinent family history.  No history of premature coronary artery disease or sudden cardiac death in her family. Social History   Tobacco Use   Smoking status: Never   Smokeless tobacco: Never  Substance Use Topics   Alcohol use: No   Marital Status: Divorced  ROS  Review of Systems  Constitutional: Negative for malaise/fatigue and weight gain.  Cardiovascular:  Negative for chest pain, claudication, leg swelling, near-syncope, orthopnea, palpitations, paroxysmal nocturnal dyspnea and syncope.  Respiratory:  Negative for shortness of breath.   Neurological:  Negative for dizziness.  Objective  Blood pressure 120/68, pulse (!) 104, temperature 97.8 F (36.6 C), height '5\' 4"'  (1.626 m), weight 163 lb (73.9 kg), SpO2 95 %.  Vitals with BMI 10/16/2020 09/23/2020 09/08/2020  Height '5\' 4"'  '5\' 4"'  -  Weight 163 lbs 164 lbs 13 oz -  BMI 60.73 71.06 -  Systolic 269 485 462  Diastolic 68 80 85  Pulse 703 83 63     Physical Exam Vitals reviewed.  Constitutional:      Comments: Mildly obese in no acute distress.  Cardiovascular:     Rate and Rhythm: Normal rate and regular rhythm.     Pulses:          Carotid pulses are 2+ on the right side and 2+ on the left side.      Femoral pulses are 2+ on the right side and 2+ on the left side.      Dorsalis pedis pulses are 2+ on the right side and 1+ on the left side.       Posterior tibial pulses are 2+ on  the right side and 0 on the left side.     Heart sounds: Normal heart sounds. No murmur heard.   No gallop.     Comments: Popliteal pulse difficult to feel due to patient's body habitus.  No JVD.   Pulmonary:     Effort: Pulmonary effort is normal.     Breath sounds: Normal breath sounds. No wheezing or rales.  Abdominal:     Comments: Obese. Pannus present  Musculoskeletal:     Right lower leg: No edema.     Left lower leg: No edema.   Skin:    General: Skin is warm and dry.     Comments: Hair loss and atrophic changes bilateral lower legs.  Neurological:     General: No focal deficit present.     Mental Status: She is oriented to person, place, and time.   Laboratory examination:   Recent Labs    09/06/20 0605 09/07/20 0437 09/08/20 0437 09/26/20 1413  NA 138 137 139 137  K 4.0 3.7 3.2* 5.0  CL 103 102 105 101  CO2 25 21* 27 24  GLUCOSE 199* 197* 117* 244*  BUN '21 22 21 21  ' CREATININE 1.46* 1.28* 1.30* 1.59*  CALCIUM 9.0 8.7* 8.4* 9.2  GFRNONAA 37* 43* 42*  --    estimated creatinine clearance is 28.7 mL/min (A) (by C-G formula based on SCr of 1.59 mg/dL (H)).  CMP Latest Ref Rng & Units 09/26/2020 09/08/2020 09/07/2020  Glucose 65 - 99 mg/dL 244(H) 117(H) 197(H)  BUN 8 - 27 mg/dL '21 21 22  ' Creatinine 0.57 - 1.00 mg/dL 1.59(H) 1.30(H) 1.28(H)  Sodium 134 - 144 mmol/L 137 139 137  Potassium 3.5 - 5.2 mmol/L 5.0 3.2(L) 3.7  Chloride 96 - 106 mmol/L 101 105 102  CO2 20 - 29 mmol/L 24 27 21(L)  Calcium 8.7 - 10.3 mg/dL 9.2 8.4(L) 8.7(L)  Total Protein 6.5 - 8.1 g/dL - - -  Total Bilirubin 0.3 - 1.2 mg/dL - - -  Alkaline Phos 38 - 126 U/L - - -  AST 15 - 41 U/L - - -  ALT 0 - 44 U/L - - -   CBC Latest Ref Rng & Units 09/07/2020 09/06/2020 09/06/2020  WBC 4.0 - 10.5 K/uL 5.0 5.9 6.9  Hemoglobin 12.0 - 15.0 g/dL 10.7(L) 10.2(L) 10.4(L)  Hematocrit 36.0 - 46.0 % 33.3(L) 32.2(L) 33.6(L)  Platelets 150 - 400 K/uL 114(L) 128(L) 144(L)    Lipid Panel No results for input(s): CHOL, TRIG, LDLCALC, VLDL, HDL, CHOLHDL, LDLDIRECT in the last 8760 hours.  HEMOGLOBIN A1C Lab Results  Component Value Date   HGBA1C 10.4 (H) 09/06/2020   MPG 251.78 09/06/2020   TSH Recent Labs    09/07/20 1255  TSH 1.023    BNP    Component Value Date/Time   BNP 321.4 (H) 09/26/2020 1407   BNP 628.4 (H) 04/25/2019 1410    ProBNP No results found for: PROBNP  External labs:   05/15/2020: Serum glucose 184 mg, BUN 26,  creatinine 1.60, EGFR 35 mL, sodium 137, potassium 4.3, CMP otherwise normal. A1c 10.0%. BNP 08/10/1975. Total cholesterol 224, triglycerides 46, HDL 113, LDL 103.  Non-HDL cholesterol 111. Labs 10/20/2016: TSH normal. Allergies   Allergies  Allergen Reactions   Amlodipine Swelling      Medications Prior to Visit:   Outpatient Medications Prior to Visit  Medication Sig Dispense Refill   apixaban (ELIQUIS) 5 MG TABS tablet Take 1 tablet (5 mg total) by mouth  2 (two) times daily. 60 tablet 0   BD PEN NEEDLE NANO 2ND GEN 32G X 4 MM MISC 3 (three) times daily. as directed     Blood Glucose Monitoring Suppl (ACCU-CHEK GUIDE ME) w/Device KIT See admin instructions.     Cholecalciferol (VITAMIN D3) 50 MCG (2000 UT) capsule Take 1 capsule by mouth daily.     diltiazem (CARDIZEM CD) 120 MG 24 hr capsule Take 1 capsule (120 mg total) by mouth daily. 30 capsule 0   furosemide (LASIX) 40 MG tablet Take 40 mg by mouth daily as needed for fluid.     gabapentin (NEURONTIN) 100 MG capsule Take 100 mg by mouth 2 (two) times daily.     glimepiride (AMARYL) 2 MG tablet Take 2 mg by mouth daily.     hydrALAZINE (APRESOLINE) 50 MG tablet Take 1 tablet (50 mg total) by mouth 3 (three) times daily. 90 tablet 2   insulin lispro (HUMALOG) 100 UNIT/ML KwikPen Inject 15 Units into the skin at bedtime.     isosorbide dinitrate (ISORDIL) 40 MG tablet Take 1 tablet (40 mg total) by mouth 3 (three) times daily. 270 tablet 3   Magnesium 250 MG TABS Take 1 tablet by mouth daily.     Misc Natural Products (LEG VEIN & CIRCULATION) TABS Take 1 tablet by mouth daily.     MISC NATURAL PRODUCTS PO Take 1 capsule by mouth daily. Hawkeye: HEMP Capsules     MISC NATURAL PRODUCTS PO Take 1 capsule by mouth daily. Ultimate Friendly Flora Probiotic     pravastatin (PRAVACHOL) 10 MG tablet Take 10 mg by mouth daily.     TRADJENTA 5 MG TABS tablet Take 5 mg by mouth daily.     Accu-Chek FastClix Lancets MISC See admin  instructions.     glucose blood test strip TEST BLOOD SUGARS 3 4 TIMES DAILY.     olmesartan (BENICAR) 20 MG tablet Take 1 tablet (20 mg total) by mouth daily. 90 tablet 3   No facility-administered medications prior to visit.     Final Medications at End of Visit    Current Meds  Medication Sig   apixaban (ELIQUIS) 5 MG TABS tablet Take 1 tablet (5 mg total) by mouth 2 (two) times daily.   BD PEN NEEDLE NANO 2ND GEN 32G X 4 MM MISC 3 (three) times daily. as directed   Blood Glucose Monitoring Suppl (ACCU-CHEK GUIDE ME) w/Device KIT See admin instructions.   Cholecalciferol (VITAMIN D3) 50 MCG (2000 UT) capsule Take 1 capsule by mouth daily.   diltiazem (CARDIZEM CD) 120 MG 24 hr capsule Take 1 capsule (120 mg total) by mouth daily.   furosemide (LASIX) 40 MG tablet Take 40 mg by mouth daily as needed for fluid.   gabapentin (NEURONTIN) 100 MG capsule Take 100 mg by mouth 2 (two) times daily.   glimepiride (AMARYL) 2 MG tablet Take 2 mg by mouth daily.   hydrALAZINE (APRESOLINE) 50 MG tablet Take 1 tablet (50 mg total) by mouth 3 (three) times daily.   insulin lispro (HUMALOG) 100 UNIT/ML KwikPen Inject 15 Units into the skin at bedtime.   isosorbide dinitrate (ISORDIL) 40 MG tablet Take 1 tablet (40 mg total) by mouth 3 (three) times daily.   Magnesium 250 MG TABS Take 1 tablet by mouth daily.   Misc Natural Products (LEG VEIN & CIRCULATION) TABS Take 1 tablet by mouth daily.   MISC NATURAL PRODUCTS PO Take 1 capsule by mouth daily. Hawkeye: HEMP Capsules  MISC NATURAL PRODUCTS PO Take 1 capsule by mouth daily. Ultimate Friendly Flora Probiotic   pravastatin (PRAVACHOL) 10 MG tablet Take 10 mg by mouth daily.   TRADJENTA 5 MG TABS tablet Take 5 mg by mouth daily.     Radiology:   Chest x-ray single view 04/25/2019: Mediastinum hilar structures normal. Cardiomegaly. Mild pulmonary venous congestion bilateral interstitial prominence. Findings suggest mild CHF.  Calcified pulmonary  nodules noted consistent with old calcified granulomas. Small right pleural effusion cannot be Excluded.  Cardiac Studies:   ABI 06/19/2020:  This exam reveals normal perfusion of the right and left lower extremity  (ABI 1.00). Mildly abnormal biphasic waveform noted at the ankles.  There  is mild plaque evident in the lower extremity vessels at the ankles.  PCV ECHOCARDIOGRAM COMPLETE 06/19/2020 Left ventricle cavity is normal in size and wall thickness. Normal global wall motion. Normal LV systolic function with EF 55%. Doppler evidence of grade II (pseudonormal) diastolic dysfunction, elevated LAP. Calculated EF 55%. Left atrial cavity is mildly dilated. Right atrial cavity is moderately dilated. Right ventricle cavity is moderately dilated. Moderately reduced right ventricular function. Trileaflet aortic valve with mild aortic valve leaflet calcification. Trace aortic stenosis. Mild (Grade I) aortic regurgitation. Moderate (Grade III) mitral regurgitation. Severe tricuspid regurgitation. Moderate pulmonary hypertension. Estimated pulmonary artery systolic pressure 52 mmHg.   PCV MYOCARDIAL PERFUSION WITH LEXISCAN 06/11/2020 Nondiagnostic ECG stress. Perfusion imaging study demonstrates decreased uptake in the inferior wall suggestive of soft tissue attenuation.  Ischemia in this region cannot be completely excluded. Overall LV systolic function is normal without regional wall motion abnormalities. Stress LV EF: 82%. Low risk.   EKG:  10/16/2020: Sinus rhythm with borderline first-degree AV block at a rate of 85 bpm.  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  09/07/2020: Atrial fibrillation, 104 bpm, right bundle branch block, left anterior fascicular block, poor R wave progression, without underlying injury pattern.  EKG 03/19/2020: Sinus rhythm with first-degree AV block at rate of 80 bpm, left axis deviation, left anterior  fascicular block.  Right bundle branch block.  Trifascicular block.  Assessment     ICD-10-CM   1. Atrial fibrillation, unspecified type ([place])  I48.91 EKG 12-Lead    2. Paroxysmal atrial fibrillation ([facility])  I48.0 LONG TERM MONITOR (3-14 DAYS)    3. Essential hypertension  M35 Basic metabolic panel       There are no discontinued medications.  No orders of the defined types were placed in this encounter.  Recommendations:   Zykeria Laguardia is a 80 y.o. African-American female with history of hypertension, uncontrolled diabetes mellitus with stage IIIb chronic kidney disease, who has been referred to me for evaluation of leg edema, pulmonary hypertension and dyspnea on exertion. Patient was recently hospitalized 09/06/2020 - 09/08/2020 with influenza. During hospitalization patient developed new onset atrial fibrillation with elevated troponin levels. Recommended rate control strategy given presentation of acute influenza infection, with recommendation that if atrial fibrillation continued could consider cardioversion following 4 weeks of anticoagulation. Given patient's CHA2DS2-VASc score of 5 she was started on Eliquis. Elevation of troponin was likely related to supply demand ischemia in the setting of rapid ventricular response and acute influenza infection. Patient was discharged on Eliquis and diltiazem. She now presents for follow up. Since last visit repeat BMP revealed mild reduction in renal function, therefore advised patient to switch Lasix from daily to as needed. Although upon further questioning it is unclear whether patient has indeed switched Lasix from daily to as needed dosing. Patient was ports overall she has improved significantly since hospitalization. Her primary concern is intermittent right arm numbness at night. She has had no known recurrence of atrial fibrillation since discharge last month. Denies chest pain, palpitations, syncope, near syncope, dizziness. She is presently tolerating anticoagulation without bleeding diathesis. hyperlipidemia, who has been referred to me for evaluation of pulmonary hypertension, leg edema and dyspnea on exertion.  Patient was recently hospitalized 09/06/2020 - 09/08/2020 with influenza.  During hospitalization patient developed new onset atrial fibrillation with elevated troponin levels.  Recommended rate control strategy given presentation of acute influenza infection, with recommendation that if atrial fibrillation continued could consider cardioversion following 4 weeks of anticoagulation.  Given patient's CHA2DS2-VASc score of 5 she was started on Eliquis.  Elevation of troponin was likely related to supply demand ischemia in the setting of rapid ventricular response and acute influenza infection.  Patient was discharged on Eliquis and diltiazem. Will monitor closely given underlying trifacisular block and use of diltiazem.   She now presents for follow up.  Since last visit repeat BMP revealed mild reduction in renal function, therefore  advised patient to switch Lasix from daily to as needed.  However it is unclear if patient has actually made this change with her Lasix dosing.  Therefore reiterated the importance of discontinuing daily Lasix and switching to as needed.  Discussed with patient regarding indications to take furosemide including swelling, shortness of breath, weight gain greater than or equal to 3  pounds in a day or 5 pounds in 1 week.  Patient verbalized understanding agreement.  She agrees to double check her medications when she gets and make the appropriate adjustment. Patient's blood pressure is now well controlled.   In regard to atrial fibrillation, patient has had no known recurrence and she is in sinus rhythm in the office at this time.  Suspect episode of atrial fibrillation was related to underlying acute influenza infection. Will obtain 2 week cardiac monitor to elvaluate for recurrence of atrial fibrillation. Given patient's CHA2DS2-VASc score of 5 will continue Eliquis for now as she is tolerating it without bleeding diathesis. However, if no atrial fibrillation episodes on monitor, could consider discontinuation of anticoagulation.   Follow up in 8 weeks, sooner if needed, for atrial fibrillation and resutls of cardiac monitor.    Alethia Berthold, PA-C 10/16/2020, 4:29 PM Office: [phone]  Addendum 11/28/2020:  Further review of EKF 10/16/2020 with Dr. Einar Gip believe there was an error in the original impression, patient's rhythm is slow atypical atrial flutter.  This patients CHA2DS2-VASc Score 5 (HNT, DM, F, Age) and yearly risk of stroke 7.2%. Recommend continuation of Eliquis.  Review of 2 week ambulatory cardiac telemetry with Dr. Einar Gip also reveals continued slow atypical atrial flutter. Therefore continued anticoagulation is indicated.  Will bring patient in for office visit as soon as possible to discuss further management, including discussion of cardioversion.    Alethia Berthold, PA-C 11/28/2020, 11:25 AM Office: [phone]

## 2020-11-08 LAB — BASIC METABOLIC PANEL
BUN/Creatinine Ratio: 10 — ABNORMAL LOW (ref 12–28)
BUN: 18 mg/dL (ref 8–27)
CO2: 21 mmol/L (ref 20–29)
Calcium: 8.9 mg/dL (ref 8.7–10.3)
Chloride: 103 mmol/L (ref 96–106)
Creatinine, Ser: 1.75 mg/dL — ABNORMAL HIGH (ref 0.57–1.00)
Glucose: 256 mg/dL — ABNORMAL HIGH (ref 65–99)
Potassium: 4.8 mmol/L (ref 3.5–5.2)
Sodium: 139 mmol/L (ref 134–144)
eGFR: 29 mL/min/{1.73_m2} — ABNORMAL LOW (ref >59)

## 2020-11-14 ENCOUNTER — Other Ambulatory Visit: Payer: Self-pay | Admitting: Cardiology

## 2020-11-14 DIAGNOSIS — I10 Essential (primary) hypertension: Secondary | ICD-10-CM

## 2020-11-19 ENCOUNTER — Other Ambulatory Visit: Payer: Self-pay | Admitting: Student

## 2020-11-19 DIAGNOSIS — I10 Essential (primary) hypertension: Secondary | ICD-10-CM

## 2020-11-19 NOTE — Progress Notes (Signed)
Kidney function continues to trend down. Please call pt and verify she is only taking Lasix PRN. If she is only taking it PRN please advise her to cut olmesartan to 10 mg once daily and keep follow up appt next month. Have repeat BMP done in 1 week, order is in.

## 2020-11-20 NOTE — Progress Notes (Signed)
Called patient, NA, LMAM

## 2020-11-20 NOTE — Progress Notes (Signed)
Patient has been taking her Lasix daily. Per Celeste, switch to PRN (Lasix) and leave Olmesartan as is, have repeat BMP in 1 week, patient aware.

## 2020-11-25 ENCOUNTER — Ambulatory Visit: Payer: Medicare Other | Admitting: Podiatry

## 2020-11-28 NOTE — Progress Notes (Signed)
Called and left voicemail for patient to call the office back to discuss findings.

## 2020-12-01 ENCOUNTER — Ambulatory Visit: Payer: 59 | Admitting: Cardiology

## 2020-12-01 NOTE — Progress Notes (Signed)
Please call patient to notify she was in atrial flutter during the course of the monitor.   Please move her appt up to this week if possible.

## 2020-12-02 NOTE — Progress Notes (Signed)
Called and spoke to pt, pt is aware. Front is scheduling pt now.

## 2020-12-03 ENCOUNTER — Other Ambulatory Visit: Payer: Self-pay

## 2020-12-03 ENCOUNTER — Ambulatory Visit: Payer: Medicare HMO | Admitting: Student

## 2020-12-03 ENCOUNTER — Encounter: Payer: Self-pay | Admitting: Student

## 2020-12-03 VITALS — BP 154/78 | HR 82 | Temp 98.0°F | Ht 64.0 in | Wt 169.2 lb

## 2020-12-03 DIAGNOSIS — I480 Paroxysmal atrial fibrillation: Secondary | ICD-10-CM

## 2020-12-03 DIAGNOSIS — I10 Essential (primary) hypertension: Secondary | ICD-10-CM

## 2020-12-03 DIAGNOSIS — I484 Atypical atrial flutter: Secondary | ICD-10-CM

## 2020-12-03 NOTE — Progress Notes (Signed)
Primary Physician/Referring:  Trey Sailors, PA  Patient ID: [name], female    DOB: 06/21/1940, 80 y.o.   MRN: [id number]  Chief Complaint  Patient presents with   Atrial Fibrillation   Follow-up   sleepiness   HPI:    [name]  is a 80 y.o. 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  Admitted 08/2021 with influenza and found to have new onset atrial fibrillation.  Patient was discharged with Eliquis and diltiazem.  She presented to our office 10/16/2020 for follow-up and EKG revealed slow atypical atrial flutter.  Therefore ordered amatory cardiac monitor to evaluate A. fib/flutter burden.  She now presents for follow-up.  2-week cardiac monitor revealed patient remained in slow atypical atrial flutter for the duration of the monitoring period.  She is asymptomatic. Denies chest pain, palpitations, syncope, near syncope, dizziness. She is presently tolerating anticoagulation without bleeding diathesis.   Blood pressure is elevated in the office today, however she states she has not yet taken her antihypertensive medications today.  Notably medication compliance has been an ongoing issue, however she is working with our clinical pharmacist to improve this.  Patient admits she is only been taking Eliquis 5 mg once daily, instead of twice daily as directed.  Past Medical History:  Diagnosis Date   Diabetes mellitus without complication ([place])    Hypertension    Past Surgical History:  Procedure Laterality Date   TONSILLECTOMY     History reviewed. No pertinent family history.  No history of premature coronary artery disease or sudden cardiac death in her family. Social History   Tobacco Use   Smoking status: Never   Smokeless tobacco: Never  Substance Use Topics    Alcohol use: No   Marital Status: Divorced  ROS  Review of Systems  Constitutional: Negative for malaise/fatigue and weight gain.  Cardiovascular:  Negative for chest pain, claudication, leg swelling, near-syncope, orthopnea, palpitations, paroxysmal nocturnal dyspnea and syncope.  Respiratory:  Negative for shortness of breath.   Neurological:  Negative for dizziness.  Objective  Blood pressure (!) 154/78, pulse 82, temperature 98 F (36.7 C), height '5\' 4"'  (1.626 m), weight 169 lb 3.2 oz (76.7 kg), SpO2 98 %.  Vitals with BMI 12/03/2020 10/16/2020 09/23/2020  Height '5\' 4"'  '5\' 4"'  '5\' 4"'   Weight 169 lbs 3 oz 163 lbs 164 lbs 13 oz  BMI 29.03 45.62 56.38  Systolic 937 342 876  Diastolic 78 68 80  Pulse 82 104 83     Physical Exam Vitals reviewed.  Constitutional:      Comments: Mildly obese in no acute distress.  Cardiovascular:     Rate and Rhythm: Normal rate and regular rhythm.     Pulses:          Carotid pulses are 2+ on the right side and 2+ on the left side.      Femoral pulses are 2+ on the right side and 2+ on the left side.      Dorsalis pedis pulses are 2+ on the right side and 1+ on the left side.       Posterior tibial pulses are 2+ on the right side and 0 on the left side.     Heart sounds: Normal heart sounds. No murmur heard.   No gallop.     Comments: Popliteal pulse difficult to feel  due to patient's body habitus.  No JVD.   Pulmonary:     Effort: Pulmonary effort is normal.     Breath sounds: Normal breath sounds. No wheezing or rales.  Abdominal:     Comments: Obese. Pannus present  Musculoskeletal:     Right lower leg: No edema.     Left lower leg: No edema.  Skin:    Comments: Hair loss and atrophic changes bilateral lower legs.  Neurological:     Mental Status: She is alert.   Laboratory examination:   Recent Labs    09/06/20 0605 09/07/20 0437 09/08/20 0437 09/26/20 1413 11/07/20 1415  NA 138 137 139 137 139  K 4.0 3.7 3.2* 5.0 4.8  CL 103  102 105 101 103  CO2 25 21* '27 24 21  ' GLUCOSE 199* 197* 117* 244* 256*  BUN '21 22 21 21 18  ' CREATININE 1.46* 1.28* 1.30* 1.59* 1.75*  CALCIUM 9.0 8.7* 8.4* 9.2 8.9  GFRNONAA 37* 43* 42*  --   --    CrCl cannot be calculated (Patient's most recent lab result is older than the maximum 21 days allowed.).  CMP Latest Ref Rng & Units 11/07/2020 09/26/2020 09/08/2020  Glucose 65 - 99 mg/dL 256(H) 244(H) 117(H)  BUN 8 - 27 mg/dL '18 21 21  ' Creatinine 0.57 - 1.00 mg/dL 1.75(H) 1.59(H) 1.30(H)  Sodium 134 - 144 mmol/L 139 137 139  Potassium 3.5 - 5.2 mmol/L 4.8 5.0 3.2(L)  Chloride 96 - 106 mmol/L 103 101 105  CO2 20 - 29 mmol/L '21 24 27  ' Calcium 8.7 - 10.3 mg/dL 8.9 9.2 8.4(L)  Total Protein 6.5 - 8.1 g/dL - - -  Total Bilirubin 0.3 - 1.2 mg/dL - - -  Alkaline Phos 38 - 126 U/L - - -  AST 15 - 41 U/L - - -  ALT 0 - 44 U/L - - -   CBC Latest Ref Rng & Units 09/07/2020 09/06/2020 09/06/2020  WBC 4.0 - 10.5 K/uL 5.0 5.9 6.9  Hemoglobin 12.0 - 15.0 g/dL 10.7(L) 10.2(L) 10.4(L)  Hematocrit 36.0 - 46.0 % 33.3(L) 32.2(L) 33.6(L)  Platelets 150 - 400 K/uL 114(L) 128(L) 144(L)    Lipid Panel No results for input(s): CHOL, TRIG, LDLCALC, VLDL, HDL, CHOLHDL, LDLDIRECT in the last 8760 hours.  HEMOGLOBIN A1C Lab Results  Component Value Date   HGBA1C 10.4 (H) 09/06/2020   MPG 251.78 09/06/2020   TSH Recent Labs    09/07/20 1255  TSH 1.023    BNP    Component Value Date/Time   BNP 321.4 (H) 09/26/2020 1407   BNP 628.4 (H) 04/25/2019 1410    ProBNP No results found for: PROBNP  External labs:   05/15/2020: Serum glucose 184 mg, BUN 26, creatinine 1.60, EGFR 35 mL, sodium 137, potassium 4.3, CMP otherwise normal. A1c 10.0%. BNP 08/10/1975. Total cholesterol 224, triglycerides 46, HDL 113, LDL 103.  Non-HDL cholesterol 111. Labs 10/20/2016: TSH normal.  Allergies   Allergies  Allergen Reactions   Amlodipine Swelling    Medications Prior to Visit:   Outpatient Medications Prior to  Visit  Medication Sig Dispense Refill   Accu-Chek FastClix Lancets MISC See admin instructions.     apixaban (ELIQUIS) 5 MG TABS tablet Take 1 tablet (5 mg total) by mouth 2 (two) times daily. 60 tablet 0   BD PEN NEEDLE NANO 2ND GEN 32G X 4 MM MISC 3 (three) times daily. as directed     Blood Glucose Monitoring Suppl (ACCU-CHEK GUIDE  ME) w/Device KIT See admin instructions.     Cholecalciferol (VITAMIN D3) 50 MCG (2000 UT) capsule Take 1 capsule by mouth daily.     diltiazem (CARDIZEM CD) 120 MG 24 hr capsule Take 1 capsule (120 mg total) by mouth daily. 30 capsule 0   furosemide (LASIX) 40 MG tablet Take 40 mg by mouth daily as needed for fluid.     gabapentin (NEURONTIN) 100 MG capsule Take 100 mg by mouth 2 (two) times daily.     hydrALAZINE (APRESOLINE) 50 MG tablet TAKE 1 TABLET(50 MG) BY MOUTH THREE TIMES DAILY 270 tablet 3   insulin lispro (HUMALOG) 100 UNIT/ML KwikPen Inject 15 Units into the skin at bedtime.     MISC NATURAL PRODUCTS PO Take 1 capsule by mouth daily. Hawkeye: HEMP Capsules     MISC NATURAL PRODUCTS PO Take 1 capsule by mouth daily. Ultimate Friendly Flora Probiotic     TRADJENTA 5 MG TABS tablet Take 5 mg by mouth daily.     glimepiride (AMARYL) 2 MG tablet Take 2 mg by mouth daily. (Patient not taking: Reported on 12/03/2020)     glucose blood test strip TEST BLOOD SUGARS 3 4 TIMES DAILY.     isosorbide dinitrate (ISORDIL) 40 MG tablet Take 1 tablet (40 mg total) by mouth 3 (three) times daily. 270 tablet 3   Magnesium 250 MG TABS Take 1 tablet by mouth daily. (Patient not taking: Reported on 12/03/2020)     Misc Natural Products (LEG VEIN & CIRCULATION) TABS Take 1 tablet by mouth daily. (Patient not taking: Reported on 12/03/2020)     olmesartan (BENICAR) 20 MG tablet Take 1 tablet (20 mg total) by mouth daily. 90 tablet 3   pravastatin (PRAVACHOL) 10 MG tablet Take 10 mg by mouth daily.     No facility-administered medications prior to visit.   Final Medications at  End of Visit    Current Meds  Medication Sig   Accu-Chek FastClix Lancets MISC See admin instructions.   apixaban (ELIQUIS) 5 MG TABS tablet Take 1 tablet (5 mg total) by mouth 2 (two) times daily.   BD PEN NEEDLE NANO 2ND GEN 32G X 4 MM MISC 3 (three) times daily. as directed   Blood Glucose Monitoring Suppl (ACCU-CHEK GUIDE ME) w/Device KIT See admin instructions.   Cholecalciferol (VITAMIN D3) 50 MCG (2000 UT) capsule Take 1 capsule by mouth daily.   diltiazem (CARDIZEM CD) 120 MG 24 hr capsule Take 1 capsule (120 mg total) by mouth daily.   furosemide (LASIX) 40 MG tablet Take 40 mg by mouth daily as needed for fluid.   gabapentin (NEURONTIN) 100 MG capsule Take 100 mg by mouth 2 (two) times daily.   hydrALAZINE (APRESOLINE) 50 MG tablet TAKE 1 TABLET(50 MG) BY MOUTH THREE TIMES DAILY   insulin lispro (HUMALOG) 100 UNIT/ML KwikPen Inject 15 Units into the skin at bedtime.   MISC NATURAL PRODUCTS PO Take 1 capsule by mouth daily. Hawkeye: HEMP Capsules   MISC NATURAL PRODUCTS PO Take 1 capsule by mouth daily. Ultimate Friendly Flora Probiotic   TRADJENTA 5 MG TABS tablet Take 5 mg by mouth daily.   Radiology:   Chest x-ray single view 04/25/2019: Mediastinum hilar structures normal. Cardiomegaly. Mild pulmonary venous congestion bilateral interstitial prominence. Findings suggest mild CHF.  Calcified pulmonary nodules noted consistent with old calcified granulomas. Small right pleural effusion cannot be Excluded.  Cardiac Studies:   ABI 06/19/2020:  This exam reveals normal perfusion of the right and  left lower extremity  (ABI 1.00). Mildly abnormal biphasic waveform noted at the ankles.  There  is mild plaque evident in the lower extremity vessels at the ankles.  PCV ECHOCARDIOGRAM COMPLETE 06/19/2020 Left ventricle cavity is normal in size and wall thickness. Normal global wall motion. Normal LV systolic function with EF 55%. Doppler evidence of grade II (pseudonormal) diastolic  dysfunction, elevated LAP. Calculated EF 55%. Left atrial cavity is mildly dilated. Right atrial cavity is moderately dilated. Right ventricle cavity is moderately dilated. Moderately reduced right ventricular function. Trileaflet aortic valve with mild aortic valve leaflet calcification. Trace aortic stenosis. Mild (Grade I) aortic regurgitation. Moderate (Grade III) mitral regurgitation. Severe tricuspid regurgitation. Moderate pulmonary hypertension. Estimated pulmonary artery systolic pressure 52 mmHg.   PCV MYOCARDIAL PERFUSION WITH LEXISCAN 06/11/2020 Nondiagnostic ECG stress. Perfusion imaging study demonstrates decreased uptake in the inferior wall suggestive of soft tissue attenuation.  Ischemia in this region cannot be completely excluded. Overall LV systolic function is normal without regional wall motion abnormalities. Stress LV EF: 82%. Low risk.  Ambulatory cardiac telemetry 10/16/20-6/30-22: Patient in atypical atrial flutter 100% of the time. Heart rate 53-130 bpm with average 87 bpm. No symptoms reported. Rare PVCs. Dr. Einar Gip assisted in interpretation of findings.  EKG:  12/03/2020: Slow atypical atrial flutter at a rate of 83 bpm.  Normal axis.  Right bundle branch block.  10/16/2020: Slow atypical atrial flutter at a rate of 85 bpm.  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  09/07/2020: Atrial fibrillation, 104 bpm, right bundle branch block, left anterior fascicular block, poor R wave progression, without underlying injury pattern.  EKG 03/19/2020: Sinus rhythm with first-degree AV block at rate of 80 bpm, left axis deviation, left anterior fascicular block.  Right bundle branch block.  Trifascicular block.  Assessment     ICD-10-CM   1. Paroxysmal atrial fibrillation (HCC)  I48.0 EKG 12-Lead    PCV ECHOCARDIOGRAM COMPLETE    2. Atypical atrial flutter (HCC)  I48.4 PCV ECHOCARDIOGRAM COMPLETE    3. Essential  hypertension  I10        There are no discontinued medications.  No orders of the defined types were placed in this encounter.  This patients CHA2DS2-VASc Score 5 (HTN, DM, age, F) and yearly risk of stroke 7.2%.   Recommendations:   [name] is a 80 y.o. 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  Admitted 08/2021 with influenza and found to have new onset atrial fibrillation.  Patient was discharged with Eliquis and diltiazem.  She presented to our office 10/16/2020 for follow-up and EKG revealed slow atypical atrial flutter.  Therefore ordered amatory cardiac monitor to evaluate A. fib/flutter burden.  She now presents for follow-up.  Reviewed and discussed with patient results of cardiac monitor, details above.  Discussed with patient regarding management options of atrial flutter including cardioversion, antiarrhythmic therapy, rate control strategy.  Discussed with patient at length the importance of medication compliance, particularly regarding anticoagulation given her high thromboembolic risk.  Patient verbalized understanding and appears motivated to take Eliquis, and other medications as directed. Patient verbalized understanding of risks, benefits of each management strategy and shared decision was to proceed with cardioversion following 4 weeks of compliance with anticoagulation.   In regard to hypertension, patient's blood pressure is elevated in the office today likely as  she has not taken her antihypertensive medications yet.  Home blood pressure readings are well controlled.  Will not make changes to medications at this time.  Will obtain repeat echocardiogram as patient has remained in atrial flutter since hospitalization in May.  Follow up in 4 weeks, sooner if needed, for  atrial flutter, hypertension.    Alethia Berthold, PA-C 12/03/2020, 5:18 PM Office: [phone]

## 2020-12-11 ENCOUNTER — Ambulatory Visit: Payer: Medicare HMO | Admitting: Student

## 2020-12-15 ENCOUNTER — Other Ambulatory Visit: Payer: Self-pay | Admitting: Pharmacist

## 2020-12-15 DIAGNOSIS — E782 Mixed hyperlipidemia: Secondary | ICD-10-CM

## 2020-12-15 DIAGNOSIS — I480 Paroxysmal atrial fibrillation: Secondary | ICD-10-CM

## 2020-12-15 NOTE — Telephone Encounter (Signed)
Reviewed and updated med list together. Pt noted to be not taking Diltiazem 120 mg, pravastatin 20 mg, and gabapentin 100 mg. Reports to be taking her Eliquis as directed and reported only missing one dose over the past 2 week. Reviewed with pt the importance of being compliant and adherent to her medication regimens in order to her co-morbidity management. Refill for pravastatin and diltiazem pended for approval with [facility]. Encouraged pt to consider using a pill box in order to help her manage her medication in a more orderly way. Also printed pt's updated medication list for pt's future reference.

## 2020-12-16 MED ORDER — PRAVASTATIN SODIUM 20 MG PO TABS: 20.0000 mg | ORAL_TABLET | Freq: Every day | ORAL | 1 refills | Status: DC

## 2020-12-16 MED ORDER — DILTIAZEM HCL ER COATED BEADS 120 MG PO CP24: 120.0000 mg | ORAL_CAPSULE | Freq: Every day | ORAL | 2 refills | Status: DC

## 2020-12-18 ENCOUNTER — Other Ambulatory Visit: Payer: Medicare HMO

## 2020-12-22 ENCOUNTER — Other Ambulatory Visit: Payer: Self-pay | Admitting: Student

## 2020-12-22 DIAGNOSIS — I10 Essential (primary) hypertension: Secondary | ICD-10-CM

## 2020-12-22 NOTE — Telephone Encounter (Signed)
This should not filled correct?

## 2020-12-22 NOTE — Telephone Encounter (Signed)
Correct, she should be taking 40 mg

## 2020-12-26 ENCOUNTER — Ambulatory Visit: Payer: Medicare HMO

## 2020-12-26 ENCOUNTER — Other Ambulatory Visit: Payer: Self-pay

## 2020-12-26 DIAGNOSIS — I484 Atypical atrial flutter: Secondary | ICD-10-CM

## 2020-12-26 DIAGNOSIS — I480 Paroxysmal atrial fibrillation: Secondary | ICD-10-CM

## 2020-12-30 ENCOUNTER — Encounter: Payer: Self-pay | Admitting: Student

## 2020-12-30 ENCOUNTER — Ambulatory Visit: Payer: Medicare HMO | Admitting: Student

## 2020-12-30 ENCOUNTER — Other Ambulatory Visit: Payer: Self-pay

## 2020-12-30 VITALS — BP 166/89 | HR 88 | Temp 97.8°F | Ht 64.0 in | Wt 169.0 lb

## 2020-12-30 DIAGNOSIS — I10 Essential (primary) hypertension: Secondary | ICD-10-CM

## 2020-12-30 DIAGNOSIS — I484 Atypical atrial flutter: Secondary | ICD-10-CM

## 2020-12-30 DIAGNOSIS — I480 Paroxysmal atrial fibrillation: Secondary | ICD-10-CM

## 2020-12-30 NOTE — Progress Notes (Signed)
 Primary Physician/Referring:  Vanstory, Ashley N, PA  Patient ID: [name], female    DOB: 05/08/1940, 80 y.o.   MRN: [id number]  Chief Complaint  Patient presents with   Atrial Fibrillation   Hypertension   Atrial Flutter   Follow-up   Weakness   HPI:    [name]  is a 80 y.o. 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  Admitted 08/2020 with influenza and found to have new onset atrial fibrillation.  Patient was discharged with Eliquis and diltiazem.  She presented to our office 10/16/2020 for follow-up and EKG revealed slow atypical atrial flutter.  Therefore ordered amatory cardiac monitor to evaluate A. fib/flutter burden.  She now presents for follow-up.  2-week cardiac monitor revealed patient remained in slow atypical atrial flutter for the duration of the monitoring period.  She is asymptomatic. Denies chest pain, palpitations, syncope, near syncope, dizziness. She is presently tolerating anticoagulation without bleeding diathesis.   Patient presents for follow-up.  She is enrolled in remote patient monitoring for blood pressure which is now well controlled with average 134/75 mmHg and heart rate averaging 81 bpm now the patient is compliant with medications.  Patient also reports she has been taking Eliquis 5 mg twice daily as directed.  Patient is feeling well without specific complaints today.  Past Medical History:  Diagnosis Date   Diabetes mellitus without complication (HCC)    Hypertension    Past Surgical History:  Procedure Laterality Date   TONSILLECTOMY     History reviewed. No pertinent family history.  No history of premature coronary artery disease or sudden cardiac death in her family. Social History   Tobacco Use   Smoking status: Never   Smokeless  tobacco: Never  Substance Use Topics   Alcohol use: No   Marital Status: Divorced  ROS  Review of Systems  Cardiovascular:  Negative for chest pain, claudication, leg swelling, near-syncope, orthopnea, palpitations, paroxysmal nocturnal dyspnea and syncope.  Respiratory:  Negative for shortness of breath.   Neurological:  Negative for dizziness.  Objective  Blood pressure (!) 166/89, pulse 88, temperature 97.8 F (36.6 C), temperature source Temporal, height 5' 4" (1.626 m), weight 169 lb (76.7 kg), head circumference 16" (40.6 cm), SpO2 97 %.  Vitals with BMI 12/30/2020 12/30/2020 12/03/2020  Height - 5' 4" 5' 4"  Weight - 169 lbs 169 lbs 3 oz  BMI - 28.99 29.03  Systolic 166 163 154  Diastolic 89 96 78  Pulse 88 105 82     Physical Exam Vitals reviewed.  Constitutional:      Comments: Mildly obese in no acute distress.  Cardiovascular:     Rate and Rhythm: Regular rhythm. Tachycardia present.     Pulses:          Carotid pulses are 2+ on the right side and 2+ on the left side.      Femoral pulses are 2+ on the right side and 2+ on the left side.      Dorsalis pedis pulses are 2+ on the right side and 1+ on the left side.       Posterior tibial pulses are 2+ on the right side and 0 on the left side.     Heart sounds: Normal heart sounds. No murmur heard.   No gallop.     Comments: Popliteal pulse difficult   to feel due to patient's body habitus.  No JVD.   Pulmonary:     Effort: Pulmonary effort is normal.     Breath sounds: Normal breath sounds. No wheezing or rales.  Abdominal:     Comments: Obese. Pannus present  Musculoskeletal:     Right lower leg: No edema.     Left lower leg: No edema.  Skin:    Comments: Hair loss and atrophic changes bilateral lower legs.  Neurological:     Mental Status: She is alert.   Laboratory examination:   Recent Labs    09/06/20 0605 09/07/20 0437 09/08/20 0437 09/26/20 1413 11/07/20 1415  NA 138 137 139 137 139  K 4.0 3.7 3.2*  5.0 4.8  CL 103 102 105 101 103  CO2 25 21* 27 24 21  GLUCOSE 199* 197* 117* 244* 256*  BUN 21 22 21 21 18  CREATININE 1.46* 1.28* 1.30* 1.59* 1.75*  CALCIUM 9.0 8.7* 8.4* 9.2 8.9  GFRNONAA 37* 43* 42*  --   --    CrCl cannot be calculated (Patient's most recent lab result is older than the maximum 21 days allowed.).  CMP Latest Ref Rng & Units 11/07/2020 09/26/2020 09/08/2020  Glucose 65 - 99 mg/dL 256(H) 244(H) 117(H)  BUN 8 - 27 mg/dL 18 21 21  Creatinine 0.57 - 1.00 mg/dL 1.75(H) 1.59(H) 1.30(H)  Sodium 134 - 144 mmol/L 139 137 139  Potassium 3.5 - 5.2 mmol/L 4.8 5.0 3.2(L)  Chloride 96 - 106 mmol/L 103 101 105  CO2 20 - 29 mmol/L 21 24 27  Calcium 8.7 - 10.3 mg/dL 8.9 9.2 8.4(L)  Total Protein 6.5 - 8.1 g/dL - - -  Total Bilirubin 0.3 - 1.2 mg/dL - - -  Alkaline Phos 38 - 126 U/L - - -  AST 15 - 41 U/L - - -  ALT 0 - 44 U/L - - -   CBC Latest Ref Rng & Units 09/07/2020 09/06/2020 09/06/2020  WBC 4.0 - 10.5 K/uL 5.0 5.9 6.9  Hemoglobin 12.0 - 15.0 g/dL 10.7(L) 10.2(L) 10.4(L)  Hematocrit 36.0 - 46.0 % 33.3(L) 32.2(L) 33.6(L)  Platelets 150 - 400 K/uL 114(L) 128(L) 144(L)    Lipid Panel No results for input(s): CHOL, TRIG, LDLCALC, VLDL, HDL, CHOLHDL, LDLDIRECT in the last 8760 hours.  HEMOGLOBIN A1C Lab Results  Component Value Date   HGBA1C 10.4 (H) 09/06/2020   MPG 251.78 09/06/2020   TSH Recent Labs    09/07/20 1255  TSH 1.023    BNP    Component Value Date/Time   BNP 321.4 (H) 09/26/2020 1407   BNP 628.4 (H) 04/25/2019 1410    ProBNP No results found for: PROBNP  External labs:   05/15/2020: Serum glucose 184 mg, BUN 26, creatinine 1.60, EGFR 35 mL, sodium 137, potassium 4.3, CMP otherwise normal. A1c 10.0%. BNP 08/10/1975. Total cholesterol 224, triglycerides 46, HDL 113, LDL 103.  Non-HDL cholesterol 111. Labs 10/20/2016: TSH normal.  Allergies   Allergies  Allergen Reactions   Amlodipine Swelling    Medications Prior to Visit:   Outpatient  Medications Prior to Visit  Medication Sig Dispense Refill   Accu-Chek FastClix Lancets MISC See admin instructions.     apixaban (ELIQUIS) 5 MG TABS tablet Take 1 tablet (5 mg total) by mouth 2 (two) times daily. 60 tablet 0   BD PEN NEEDLE NANO 2ND GEN 32G X 4 MM MISC 3 (three) times daily. as directed     Blood Glucose Monitoring Suppl (  ACCU-CHEK GUIDE ME) w/Device KIT See admin instructions.     Calcium-Magnesium-Vitamin D (CALCIUM MAGNESIUM PO) Take 1 tablet by mouth daily. 500-200 mg     Cholecalciferol (VITAMIN D3) 50 MCG (2000 UT) capsule Take 1 capsule by mouth daily.     Coenzyme Q10 200 MG capsule Take 200 mg by mouth daily.     D-Mannose 500 MG CAPS Take 1 capsule by mouth daily.     diltiazem (CARDIZEM CD) 120 MG 24 hr capsule Take 1 capsule (120 mg total) by mouth daily. 30 capsule 2   furosemide (LASIX) 40 MG tablet Take 40 mg by mouth daily as needed for edema or fluid.     gabapentin (NEURONTIN) 100 MG capsule Take 100 mg by mouth 2 (two) times daily.     glucose blood test strip TEST BLOOD SUGARS 3 4 TIMES DAILY.     hydrALAZINE (APRESOLINE) 50 MG tablet TAKE 1 TABLET(50 MG) BY MOUTH THREE TIMES DAILY 270 tablet 3   insulin lispro (HUMALOG) 100 UNIT/ML KwikPen Inject 15 Units into the skin at bedtime.     isosorbide dinitrate (ISORDIL) 40 MG tablet Take 1 tablet (40 mg total) by mouth 3 (three) times daily. (Patient taking differently: Take 30 mg by mouth 3 (three) times daily.) 270 tablet 3   MISC NATURAL PRODUCTS PO Take 1 capsule by mouth daily. Hawkeye: HEMP Capsules     MISC NATURAL PRODUCTS PO Take 1 capsule by mouth daily. Ultimate Friendly Flora Probiotic     Multiple Vitamins-Minerals (MULTIVITAMIN ADULTS) TABS Take 1 tablet by mouth daily.     olmesartan (BENICAR) 20 MG tablet Take 1 tablet (20 mg total) by mouth daily. 90 tablet 3   pravastatin (PRAVACHOL) 20 MG tablet Take 1 tablet (20 mg total) by mouth daily. 90 tablet 1   TRADJENTA 5 MG TABS tablet Take 5 mg by  mouth daily.     Misc Natural Products (LEG VEIN & CIRCULATION) TABS Take 1 tablet by mouth daily.     No facility-administered medications prior to visit.   Final Medications at End of Visit    Current Meds  Medication Sig   Accu-Chek FastClix Lancets MISC See admin instructions.   apixaban (ELIQUIS) 5 MG TABS tablet Take 1 tablet (5 mg total) by mouth 2 (two) times daily.   BD PEN NEEDLE NANO 2ND GEN 32G X 4 MM MISC 3 (three) times daily. as directed   Blood Glucose Monitoring Suppl (ACCU-CHEK GUIDE ME) w/Device KIT See admin instructions.   Calcium-Magnesium-Vitamin D (CALCIUM MAGNESIUM PO) Take 1 tablet by mouth daily. 500-200 mg   Cholecalciferol (VITAMIN D3) 50 MCG (2000 UT) capsule Take 1 capsule by mouth daily.   Coenzyme Q10 200 MG capsule Take 200 mg by mouth daily.   D-Mannose 500 MG CAPS Take 1 capsule by mouth daily.   diltiazem (CARDIZEM CD) 120 MG 24 hr capsule Take 1 capsule (120 mg total) by mouth daily.   furosemide (LASIX) 40 MG tablet Take 40 mg by mouth daily as needed for edema or fluid.   gabapentin (NEURONTIN) 100 MG capsule Take 100 mg by mouth 2 (two) times daily.   glucose blood test strip TEST BLOOD SUGARS 3 4 TIMES DAILY.   hydrALAZINE (APRESOLINE) 50 MG tablet TAKE 1 TABLET(50 MG) BY MOUTH THREE TIMES DAILY   insulin lispro (HUMALOG) 100 UNIT/ML KwikPen Inject 15 Units into the skin at bedtime.   isosorbide dinitrate (ISORDIL) 40 MG tablet Take 1 tablet (40 mg total) by   mouth 3 (three) times daily. (Patient taking differently: Take 30 mg by mouth 3 (three) times daily.)   MISC NATURAL PRODUCTS PO Take 1 capsule by mouth daily. Hawkeye: HEMP Capsules   MISC NATURAL PRODUCTS PO Take 1 capsule by mouth daily. Ultimate Friendly Flora Probiotic   Multiple Vitamins-Minerals (MULTIVITAMIN ADULTS) TABS Take 1 tablet by mouth daily.   olmesartan (BENICAR) 20 MG tablet Take 1 tablet (20 mg total) by mouth daily.   pravastatin (PRAVACHOL) 20 MG tablet Take 1 tablet (20  mg total) by mouth daily.   TRADJENTA 5 MG TABS tablet Take 5 mg by mouth daily.   Radiology:   Chest x-ray single view 04/25/2019: Mediastinum hilar structures normal. Cardiomegaly. Mild pulmonary venous congestion bilateral interstitial prominence. Findings suggest mild CHF.  Calcified pulmonary nodules noted consistent with old calcified granulomas. Small right pleural effusion cannot be Excluded.  Cardiac Studies:   ABI 06/19/2020:  This exam reveals normal perfusion of the right and left lower extremity  (ABI 1.00). Mildly abnormal biphasic waveform noted at the ankles.  There  is mild plaque evident in the lower extremity vessels at the ankles.  PCV ECHOCARDIOGRAM COMPLETE 06/19/2020 Left ventricle cavity is normal in size and wall thickness. Normal global wall motion. Normal LV systolic function with EF 55%. Doppler evidence of grade II (pseudonormal) diastolic dysfunction, elevated LAP. Calculated EF 55%. Left atrial cavity is mildly dilated. Right atrial cavity is moderately dilated. Right ventricle cavity is moderately dilated. Moderately reduced right ventricular function. Trileaflet aortic valve with mild aortic valve leaflet calcification. Trace aortic stenosis. Mild (Grade I) aortic regurgitation. Moderate (Grade III) mitral regurgitation. Severe tricuspid regurgitation. Moderate pulmonary hypertension. Estimated pulmonary artery systolic pressure 52 mmHg.   PCV MYOCARDIAL PERFUSION WITH LEXISCAN 06/11/2020 Nondiagnostic ECG stress. Perfusion imaging study demonstrates decreased uptake in the inferior wall suggestive of soft tissue attenuation.  Ischemia in this region cannot be completely excluded. Overall LV systolic function is normal without regional wall motion abnormalities. Stress LV EF: 82%. Low risk.  Ambulatory cardiac telemetry 10/16/20-6/30-22: Patient in atypical atrial flutter 100% of the time. Heart rate 53-130 bpm with average 87 bpm. No symptoms  reported. Rare PVCs. Dr. Ganji assisted in interpretation of findings.  EKG:  12/30/2020: Atypical atrial flutter at a rate of 104 bpm.  Left axis.  right bundle branch block.  10/16/2020: Slow atypical atrial flutter at a rate of 85 bpm.  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  09/07/2020: Atrial fibrillation, 104 bpm, right bundle branch block, left anterior fascicular block, poor R wave progression, without underlying injury pattern.  EKG 03/19/2020: Sinus rhythm with first-degree AV block at rate of 80 bpm, left axis deviation, left anterior fascicular block.  Right bundle branch block.  Trifascicular block.  Assessment     ICD-10-CM   1. Atypical atrial flutter (HCC)  I48.4     2. Essential hypertension  I10     3. Paroxysmal atrial fibrillation (HCC)  I48.0 EKG 12-Lead    Basic metabolic panel    CBC       Medications Discontinued During This Encounter  Medication Reason   Misc Natural Products (LEG VEIN & CIRCULATION) TABS Error    No orders of the defined types were placed in this encounter.  This patients CHA2DS2-VASc Score 5 (HTN, DM, age, F) and yearly risk of stroke 7.2%.   Recommendations:   [name] is a 80 y.o. African-American female with history of hypertension, uncontrolled   diabetes mellitus with stage IIIb chronic kidney disease, hyperlipidemia, who was originally referred for evaluation of leg edema, pulmonary hypertension and dyspnea on exertion.  Admitted 08/2021 with influenza and found to have new onset atrial fibrillation.  Patient was discharged with Eliquis and diltiazem.  She presented to our office 10/16/2020 for follow-up and EKG revealed slow atypical atrial flutter.  Therefore ordered amatory cardiac monitor to evaluate A. fib/flutter burden.  She now presents for follow-up.  Patient's blood pressure is now well controlled and she been compliant with medications, including Eliquis.  Again  discussed with patient regarding direct-current cardioversion.  Discussed risks versus benefits, patient verbalized understanding and wishes to proceed with cardioversion.  Discussed with Dr. Ganji starting amiodarone prior to cardioversion, however recommendation was to hold off and instead proceed with direct-current cardioversion without antiarrhythmic therapy.  Will not make changes to patient's medications at this time.  We will plan to follow-up after cardioversion with repeat EKG.  Patient was seen in collaboration with Dr. Ganji and he is in agreement with the plan.     Pranay Hilbun C Kingsten Enfield, PA-C 12/30/2020, 2:52 PM Office: [phone]  

## 2020-12-30 NOTE — H&P (View-Only) (Signed)
Primary Physician/Referring:  Trey Sailors, PA  Patient ID: [name], female    DOB: 1940-09-11, 80 y.o.   MRN: [id number]  Chief Complaint  Patient presents with   Atrial Fibrillation   Hypertension   Atrial Flutter   Follow-up   Weakness   HPI:    [name]  is a 80 y.o. 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  Admitted 08/2020 with influenza and found to have new onset atrial fibrillation.  Patient was discharged with Eliquis and diltiazem.  She presented to our office 10/16/2020 for follow-up and EKG revealed slow atypical atrial flutter.  Therefore ordered amatory cardiac monitor to evaluate A. fib/flutter burden.  She now presents for follow-up.  2-week cardiac monitor revealed patient remained in slow atypical atrial flutter for the duration of the monitoring period.  She is asymptomatic. Denies chest pain, palpitations, syncope, near syncope, dizziness. She is presently tolerating anticoagulation without bleeding diathesis.   Patient presents for follow-up.  She is enrolled in remote patient monitoring for blood pressure which is now well controlled with average 134/75 mmHg and heart rate averaging 81 bpm now the patient is compliant with medications.  Patient also reports she has been taking Eliquis 5 mg twice daily as directed.  Patient is feeling well without specific complaints today.  Past Medical History:  Diagnosis Date   Diabetes mellitus without complication ([place])    Hypertension    Past Surgical History:  Procedure Laterality Date   TONSILLECTOMY     History reviewed. No pertinent family history.  No history of premature coronary artery disease or sudden cardiac death in her family. Social History   Tobacco Use   Smoking status: Never   Smokeless  tobacco: Never  Substance Use Topics   Alcohol use: No   Marital Status: Divorced  ROS  Review of Systems  Cardiovascular:  Negative for chest pain, claudication, leg swelling, near-syncope, orthopnea, palpitations, paroxysmal nocturnal dyspnea and syncope.  Respiratory:  Negative for shortness of breath.   Neurological:  Negative for dizziness.  Objective  Blood pressure (!) 166/89, pulse 88, temperature 97.8 F (36.6 C), temperature source Temporal, height 5' 4" (1.626 m), weight 169 lb (76.7 kg), head circumference 16" (40.6 cm), SpO2 97 %.  Vitals with BMI 12/30/2020 12/30/2020 12/03/2020  Height - 5' 4" 5' 4"  Weight - 169 lbs 169 lbs 3 oz  BMI - 88.89 16.94  Systolic 503 888 280  Diastolic 89 96 78  Pulse 88 105 82     Physical Exam Vitals reviewed.  Constitutional:      Comments: Mildly obese in no acute distress.  Cardiovascular:     Rate and Rhythm: Regular rhythm. Tachycardia present.     Pulses:          Carotid pulses are 2+ on the right side and 2+ on the left side.      Femoral pulses are 2+ on the right side and 2+ on the left side.      Dorsalis pedis pulses are 2+ on the right side and 1+ on the left side.       Posterior tibial pulses are 2+ on the right side and 0 on the left side.     Heart sounds: Normal heart sounds. No murmur heard.   No gallop.     Comments: Popliteal pulse difficult  to feel due to patient's body habitus.  No JVD.   Pulmonary:     Effort: Pulmonary effort is normal.     Breath sounds: Normal breath sounds. No wheezing or rales.  Abdominal:     Comments: Obese. Pannus present  Musculoskeletal:     Right lower leg: No edema.     Left lower leg: No edema.  Skin:    Comments: Hair loss and atrophic changes bilateral lower legs.  Neurological:     Mental Status: She is alert.   Laboratory examination:   Recent Labs    09/06/20 0605 09/07/20 0437 09/08/20 0437 09/26/20 1413 11/07/20 1415  NA 138 137 139 137 139  K 4.0 3.7 3.2*  5.0 4.8  CL 103 102 105 101 103  CO2 25 21* _0 GLUCOSE 199* 197* 117* 244* 256*  BUN _1 CREATININE 1.46* 1.28* 1.30* 1.59* 1.75*  CALCIUM 9.0 8.7* 8.4* 9.2 8.9  GFRNONAA 37* 43* 42*  --   --    CrCl cannot be calculated (Patient's most recent lab result is older than the maximum 21 days allowed.).  CMP Latest Ref Rng & Units 11/07/2020 09/26/2020 09/08/2020  Glucose 65 - 99 mg/dL 256(H) 244(H) 117(H)  BUN 8 - 27 mg/dL _2 Creatinine 0.57 - 1.00 mg/dL 1.75(H) 1.59(H) 1.30(H)  Sodium 134 - 144 mmol/L 139 137 139  Potassium 3.5 - 5.2 mmol/L 4.8 5.0 3.2(L)  Chloride 96 - 106 mmol/L 103 101 105  CO2 20 - 29 mmol/L _3 Calcium 8.7 - 10.3 mg/dL 8.9 9.2 8.4(L)  Total Protein 6.5 - 8.1 g/dL - - -  Total Bilirubin 0.3 - 1.2 mg/dL - - -  Alkaline Phos 38 - 126 U/L - - -  AST 15 - 41 U/L - - -  ALT 0 - 44 U/L - - -   CBC Latest Ref Rng & Units 09/07/2020 09/06/2020 09/06/2020  WBC 4.0 - 10.5 K/uL 5.0 5.9 6.9  Hemoglobin 12.0 - 15.0 g/dL 10.7(L) 10.2(L) 10.4(L)  Hematocrit 36.0 - 46.0 % 33.3(L) 32.2(L) 33.6(L)  Platelets 150 - 400 K/uL 114(L) 128(L) 144(L)    Lipid Panel No results for input(s): CHOL, TRIG, LDLCALC, VLDL, HDL, CHOLHDL, LDLDIRECT in the last 8760 hours.  HEMOGLOBIN A1C Lab Results  Component Value Date   HGBA1C 10.4 (H) 09/06/2020   MPG 251.78 09/06/2020   TSH Recent Labs    09/07/20 1255  TSH 1.023    BNP    Component Value Date/Time   BNP 321.4 (H) 09/26/2020 1407   BNP 628.4 (H) 04/25/2019 1410    ProBNP No results found for: PROBNP  External labs:   05/15/2020: Serum glucose 184 mg, BUN 26, creatinine 1.60, EGFR 35 mL, sodium 137, potassium 4.3, CMP otherwise normal. A1c 10.0%. BNP 08/10/1975. Total cholesterol 224, triglycerides 46, HDL 113, LDL 103.  Non-HDL cholesterol 111. Labs 10/20/2016: TSH normal.  Allergies   Allergies  Allergen Reactions   Amlodipine Swelling    Medications Prior to Visit:   Outpatient  Medications Prior to Visit  Medication Sig Dispense Refill   Accu-Chek FastClix Lancets MISC See admin instructions.     apixaban (ELIQUIS) 5 MG TABS tablet Take 1 tablet (5 mg total) by mouth 2 (two) times daily. 60 tablet 0   BD PEN NEEDLE NANO 2ND GEN 32G X 4 MM MISC 3 (three) times daily. as directed     Blood Glucose Monitoring Suppl (  ACCU-CHEK GUIDE ME) w/Device KIT See admin instructions.     Calcium-Magnesium-Vitamin D (CALCIUM MAGNESIUM PO) Take 1 tablet by mouth daily. 500-200 mg     Cholecalciferol (VITAMIN D3) 50 MCG (2000 UT) capsule Take 1 capsule by mouth daily.     Coenzyme Q10 200 MG capsule Take 200 mg by mouth daily.     D-Mannose 500 MG CAPS Take 1 capsule by mouth daily.     diltiazem (CARDIZEM CD) 120 MG 24 hr capsule Take 1 capsule (120 mg total) by mouth daily. 30 capsule 2   furosemide (LASIX) 40 MG tablet Take 40 mg by mouth daily as needed for edema or fluid.     gabapentin (NEURONTIN) 100 MG capsule Take 100 mg by mouth 2 (two) times daily.     glucose blood test strip TEST BLOOD SUGARS 3 4 TIMES DAILY.     hydrALAZINE (APRESOLINE) 50 MG tablet TAKE 1 TABLET(50 MG) BY MOUTH THREE TIMES DAILY 270 tablet 3   insulin lispro (HUMALOG) 100 UNIT/ML KwikPen Inject 15 Units into the skin at bedtime.     isosorbide dinitrate (ISORDIL) 40 MG tablet Take 1 tablet (40 mg total) by mouth 3 (three) times daily. (Patient taking differently: Take 30 mg by mouth 3 (three) times daily.) 270 tablet 3   MISC NATURAL PRODUCTS PO Take 1 capsule by mouth daily. Hawkeye: HEMP Capsules     MISC NATURAL PRODUCTS PO Take 1 capsule by mouth daily. Ultimate Friendly Flora Probiotic     Multiple Vitamins-Minerals (MULTIVITAMIN ADULTS) TABS Take 1 tablet by mouth daily.     olmesartan (BENICAR) 20 MG tablet Take 1 tablet (20 mg total) by mouth daily. 90 tablet 3   pravastatin (PRAVACHOL) 20 MG tablet Take 1 tablet (20 mg total) by mouth daily. 90 tablet 1   TRADJENTA 5 MG TABS tablet Take 5 mg by  mouth daily.     Misc Natural Products (LEG VEIN & CIRCULATION) TABS Take 1 tablet by mouth daily.     No facility-administered medications prior to visit.   Final Medications at End of Visit    Current Meds  Medication Sig   Accu-Chek FastClix Lancets MISC See admin instructions.   apixaban (ELIQUIS) 5 MG TABS tablet Take 1 tablet (5 mg total) by mouth 2 (two) times daily.   BD PEN NEEDLE NANO 2ND GEN 32G X 4 MM MISC 3 (three) times daily. as directed   Blood Glucose Monitoring Suppl (ACCU-CHEK GUIDE ME) w/Device KIT See admin instructions.   Calcium-Magnesium-Vitamin D (CALCIUM MAGNESIUM PO) Take 1 tablet by mouth daily. 500-200 mg   Cholecalciferol (VITAMIN D3) 50 MCG (2000 UT) capsule Take 1 capsule by mouth daily.   Coenzyme Q10 200 MG capsule Take 200 mg by mouth daily.   D-Mannose 500 MG CAPS Take 1 capsule by mouth daily.   diltiazem (CARDIZEM CD) 120 MG 24 hr capsule Take 1 capsule (120 mg total) by mouth daily.   furosemide (LASIX) 40 MG tablet Take 40 mg by mouth daily as needed for edema or fluid.   gabapentin (NEURONTIN) 100 MG capsule Take 100 mg by mouth 2 (two) times daily.   glucose blood test strip TEST BLOOD SUGARS 3 4 TIMES DAILY.   hydrALAZINE (APRESOLINE) 50 MG tablet TAKE 1 TABLET(50 MG) BY MOUTH THREE TIMES DAILY   insulin lispro (HUMALOG) 100 UNIT/ML KwikPen Inject 15 Units into the skin at bedtime.   isosorbide dinitrate (ISORDIL) 40 MG tablet Take 1 tablet (40 mg total) by  mouth 3 (three) times daily. (Patient taking differently: Take 30 mg by mouth 3 (three) times daily.)   MISC NATURAL PRODUCTS PO Take 1 capsule by mouth daily. Hawkeye: HEMP Capsules   MISC NATURAL PRODUCTS PO Take 1 capsule by mouth daily. Ultimate Friendly Flora Probiotic   Multiple Vitamins-Minerals (MULTIVITAMIN ADULTS) TABS Take 1 tablet by mouth daily.   olmesartan (BENICAR) 20 MG tablet Take 1 tablet (20 mg total) by mouth daily.   pravastatin (PRAVACHOL) 20 MG tablet Take 1 tablet (20  mg total) by mouth daily.   TRADJENTA 5 MG TABS tablet Take 5 mg by mouth daily.   Radiology:   Chest x-ray single view 04/25/2019: Mediastinum hilar structures normal. Cardiomegaly. Mild pulmonary venous congestion bilateral interstitial prominence. Findings suggest mild CHF.  Calcified pulmonary nodules noted consistent with old calcified granulomas. Small right pleural effusion cannot be Excluded.  Cardiac Studies:   ABI 06/19/2020:  This exam reveals normal perfusion of the right and left lower extremity  (ABI 1.00). Mildly abnormal biphasic waveform noted at the ankles.  There  is mild plaque evident in the lower extremity vessels at the ankles.  PCV ECHOCARDIOGRAM COMPLETE 06/19/2020 Left ventricle cavity is normal in size and wall thickness. Normal global wall motion. Normal LV systolic function with EF 55%. Doppler evidence of grade II (pseudonormal) diastolic dysfunction, elevated LAP. Calculated EF 55%. Left atrial cavity is mildly dilated. Right atrial cavity is moderately dilated. Right ventricle cavity is moderately dilated. Moderately reduced right ventricular function. Trileaflet aortic valve with mild aortic valve leaflet calcification. Trace aortic stenosis. Mild (Grade I) aortic regurgitation. Moderate (Grade III) mitral regurgitation. Severe tricuspid regurgitation. Moderate pulmonary hypertension. Estimated pulmonary artery systolic pressure 52 mmHg.   PCV MYOCARDIAL PERFUSION WITH LEXISCAN 06/11/2020 Nondiagnostic ECG stress. Perfusion imaging study demonstrates decreased uptake in the inferior wall suggestive of soft tissue attenuation.  Ischemia in this region cannot be completely excluded. Overall LV systolic function is normal without regional wall motion abnormalities. Stress LV EF: 82%. Low risk.  Ambulatory cardiac telemetry 10/16/20-6/30-22: Patient in atypical atrial flutter 100% of the time. Heart rate 53-130 bpm with average 87 bpm. No symptoms  reported. Rare PVCs. Dr. Einar Gip assisted in interpretation of findings.  EKG:  12/30/2020: Atypical atrial flutter at a rate of 104 bpm.  Left axis.  right bundle branch block.  10/16/2020: Slow atypical atrial flutter at a rate of 85 bpm.  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  09/07/2020: Atrial fibrillation, 104 bpm, right bundle branch block, left anterior fascicular block, poor R wave progression, without underlying injury pattern.  EKG 03/19/2020: Sinus rhythm with first-degree AV block at rate of 80 bpm, left axis deviation, left anterior fascicular block.  Right bundle branch block.  Trifascicular block.  Assessment     ICD-10-CM   1. Atypical atrial flutter (HCC)  I48.4     2. Essential hypertension  I10     3. Paroxysmal atrial fibrillation (HCC)  I48.0 EKG [id number]    Basic metabolic panel    CBC       Medications Discontinued During This Encounter  Medication Reason   Misc Natural Products ([place]) TABS Error    No orders of the defined types were placed in this encounter.  This patients CHA2DS2-VASc Score 5 (HTN, DM, age, F) and yearly risk of stroke 7.2%.   Recommendations:   [name] is a 80 y.o. African-American female with history of hypertension, uncontrolled  diabetes mellitus with stage IIIb chronic kidney disease, hyperlipidemia, who was originally referred for evaluation of leg edema, pulmonary hypertension and dyspnea on exertion.  Admitted 08/2021 with influenza and found to have new onset atrial fibrillation.  Patient was discharged with Eliquis and diltiazem.  She presented to our office 10/16/2020 for follow-up and EKG revealed slow atypical atrial flutter.  Therefore ordered amatory cardiac monitor to evaluate A. fib/flutter burden.  She now presents for follow-up.  Patient's blood pressure is now well controlled and she been compliant with medications, including Eliquis.  Again  discussed with patient regarding direct-current cardioversion.  Discussed risks versus benefits, patient verbalized understanding and wishes to proceed with cardioversion.  Discussed with Dr. Einar Gip starting amiodarone prior to cardioversion, however recommendation was to hold off and instead proceed with direct-current cardioversion without antiarrhythmic therapy.  Will not make changes to patient's medications at this time.  We will plan to follow-up after cardioversion with repeat EKG.  Patient was seen in collaboration with Dr. Einar Gip and he is in agreement with the plan.     Terry Berthold, PA-C 12/30/2020, 2:52 PM Office: [phone]

## 2020-12-31 ENCOUNTER — Ambulatory Visit: Payer: Medicare HMO | Admitting: Student

## 2021-01-14 ENCOUNTER — Encounter (HOSPITAL_COMMUNITY): Payer: Self-pay | Admitting: Cardiology

## 2021-01-14 ENCOUNTER — Ambulatory Visit (HOSPITAL_COMMUNITY): Payer: Medicare HMO | Admitting: Anesthesiology

## 2021-01-14 ENCOUNTER — Ambulatory Visit (HOSPITAL_COMMUNITY)
Admission: RE | Admit: 2021-01-14 | Discharge: 2021-01-14 | Disposition: A | Discharge status: Home / Self Care | Payer: Medicare HMO | Attending: Cardiology | Admitting: Cardiology

## 2021-01-14 ENCOUNTER — Other Ambulatory Visit: Payer: Self-pay

## 2021-01-14 ENCOUNTER — Encounter (HOSPITAL_COMMUNITY)
Admission: RE | Disposition: A | Discharge status: Home / Self Care | Payer: Self-pay | Source: Home / Self Care | Attending: Cardiology

## 2021-01-14 DIAGNOSIS — I484 Atypical atrial flutter: Secondary | ICD-10-CM | POA: Diagnosis not present

## 2021-01-14 DIAGNOSIS — N183 Chronic kidney disease, stage 3 unspecified: Secondary | ICD-10-CM | POA: Diagnosis not present

## 2021-01-14 DIAGNOSIS — Z79899 Other long term (current) drug therapy: Secondary | ICD-10-CM | POA: Insufficient documentation

## 2021-01-14 DIAGNOSIS — E785 Hyperlipidemia, unspecified: Secondary | ICD-10-CM | POA: Insufficient documentation

## 2021-01-14 DIAGNOSIS — I4819 Other persistent atrial fibrillation: Secondary | ICD-10-CM

## 2021-01-14 DIAGNOSIS — I272 Pulmonary hypertension, unspecified: Secondary | ICD-10-CM | POA: Diagnosis not present

## 2021-01-14 DIAGNOSIS — E1165 Type 2 diabetes mellitus with hyperglycemia: Secondary | ICD-10-CM | POA: Diagnosis not present

## 2021-01-14 DIAGNOSIS — E1122 Type 2 diabetes mellitus with diabetic chronic kidney disease: Secondary | ICD-10-CM | POA: Diagnosis not present

## 2021-01-14 DIAGNOSIS — I480 Paroxysmal atrial fibrillation: Secondary | ICD-10-CM | POA: Insufficient documentation

## 2021-01-14 DIAGNOSIS — I129 Hypertensive chronic kidney disease with stage 1 through stage 4 chronic kidney disease, or unspecified chronic kidney disease: Secondary | ICD-10-CM | POA: Insufficient documentation

## 2021-01-14 DIAGNOSIS — Z794 Long term (current) use of insulin: Secondary | ICD-10-CM | POA: Diagnosis not present

## 2021-01-14 DIAGNOSIS — Z7901 Long term (current) use of anticoagulants: Secondary | ICD-10-CM | POA: Insufficient documentation

## 2021-01-14 DIAGNOSIS — R0609 Other forms of dyspnea: Secondary | ICD-10-CM | POA: Insufficient documentation

## 2021-01-14 HISTORY — PX: CARDIOVERSION: SHX1299

## 2021-01-14 LAB — BASIC METABOLIC PANEL
BUN/Creatinine Ratio: 9 — ABNORMAL LOW (ref 12–28)
BUN: 17 mg/dL (ref 8–27)
CO2: 20 mmol/L (ref 20–29)
Calcium: 8.7 mg/dL (ref 8.7–10.3)
Chloride: 98 mmol/L (ref 96–106)
Creatinine, Ser: 1.95 mg/dL — ABNORMAL HIGH (ref 0.57–1.00)
Glucose: 194 mg/dL — ABNORMAL HIGH (ref 65–99)
Potassium: 5 mmol/L (ref 3.5–5.2)
Sodium: 133 mmol/L — ABNORMAL LOW (ref 134–144)
eGFR: 26 mL/min/{1.73_m2} — ABNORMAL LOW (ref >59)

## 2021-01-14 LAB — CBC
Hematocrit: 28.5 % — ABNORMAL LOW (ref 34.0–46.6)
Hemoglobin: 9.3 g/dL — ABNORMAL LOW (ref 11.1–15.9)
MCH: 28.4 pg (ref 26.6–33.0)
MCHC: 32.6 g/dL (ref 31.5–35.7)
MCV: 87 fL (ref 79–97)
Platelets: 165 10*3/uL (ref 150–450)
RBC: 3.27 x10E6/uL — ABNORMAL LOW (ref 3.77–5.28)
RDW: 14.2 % (ref 11.7–15.4)
WBC: 4.6 10*3/uL (ref 3.4–10.8)

## 2021-01-14 LAB — GLUCOSE, CAPILLARY: Glucose-Capillary: 80 mg/dL (ref 70–99)

## 2021-01-14 SURGERY — CARDIOVERSION
Anesthesia: General

## 2021-01-14 MED ORDER — LIDOCAINE HCL (CARDIAC) PF 100 MG/5ML IV SOSY
PREFILLED_SYRINGE | INTRAVENOUS | Status: DC | PRN
  Administered 2021-01-14: 40 mg via INTRAVENOUS

## 2021-01-14 MED ORDER — SODIUM CHLORIDE 0.9 % IV SOLN: INTRAVENOUS | Status: DC | PRN

## 2021-01-14 MED ORDER — PROPOFOL 10 MG/ML IV BOLUS
INTRAVENOUS | Status: DC | PRN
  Administered 2021-01-14: 50 mg via INTRAVENOUS

## 2021-01-14 NOTE — Interval H&P Note (Signed)
History and Physical Interval Note:  01/14/2021 8:36 AM  [name]  has presented today for surgery, with the diagnosis of AFIB.  The various methods of treatment have been discussed with the patient and family. After consideration of risks, benefits and other options for treatment, the patient has consented to  Procedure(s): CARDIOVERSION (N/A) as a surgical intervention.  The patient's history has been reviewed, patient examined, no change in status, stable for surgery.  I have reviewed the patient's chart and labs.  Questions were answered to the patient's satisfaction.     Adrian Prows

## 2021-01-14 NOTE — CV Procedure (Signed)
Direct current cardioversion 01/14/2021 8:59 AM  Indication symptomatic A. Fibrillation.  Procedure: Using 40 mg of IV Propofol and 50 IV Lidocaine (for reducing venous pain) for achieving deep sedation, synchronized direct current cardioversion performed. Patient was delivered with 120 Joules of electricity X 1 with success to NSR. Patient tolerated the procedure well. No immediate complication noted.   Allergies as of 01/14/2021       Reactions   Peanut-containing Drug Products    Stomach pain   Amlodipine Swelling        Medication List     STOP taking these medications    ibuprofen 200 MG tablet Commonly known as: ADVIL   olmesartan 20 MG tablet Commonly known as: BENICAR       TAKE these medications    Accu-Chek FastClix Lancets Misc See admin instructions.   Accu-Chek Guide Me w/Device Kit See admin instructions.   apixaban 5 MG Tabs tablet Commonly known as: ELIQUIS Take 1 tablet (5 mg total) by mouth 2 (two) times daily.   BD Pen Needle Nano 2nd Gen 32G X 4 MM Misc Generic drug: Insulin Pen Needle 3 (three) times daily. as directed   Coenzyme Q10 200 MG capsule Take 200 mg by mouth 3 (three) times a week.   D-Mannose 500 MG Caps Take 1 capsule by mouth daily.   diltiazem 120 MG 24 hr capsule Commonly known as: CARDIZEM CD Take 1 capsule (120 mg total) by mouth daily.   furosemide 20 MG tablet Commonly known as: LASIX Take 20 mg by mouth at bedtime.   glimepiride 2 MG tablet Commonly known as: AMARYL Take 2 mg by mouth daily.   glucose blood test strip TEST BLOOD SUGARS 3 4 TIMES DAILY.   hydrALAZINE 50 MG tablet Commonly known as: APRESOLINE TAKE 1 TABLET(50 MG) BY MOUTH THREE TIMES DAILY   insulin lispro 100 UNIT/ML KwikPen Commonly known as: HUMALOG Inject 15 Units into the skin at bedtime.   isosorbide dinitrate 30 MG tablet Commonly known as: ISORDIL Take 30 mg by mouth 3 (three) times daily.   MISC NATURAL PRODUCTS PO Take 1  capsule by mouth 3 (three) times a week. Hawkeye: HEMP Capsules   MISC NATURAL PRODUCTS PO Take 1 capsule by mouth daily. Ultimate Friendly Flora Probiotic   Multivitamin Adults Tabs Take 1 tablet by mouth daily.   MUSCLE RUB EX Apply 1 application topically daily as needed (leg cramps).   pravastatin 20 MG tablet Commonly known as: PRAVACHOL Take 1 tablet (20 mg total) by mouth daily. What changed: when to take this   VITAMIN D PO Take 1 capsule by mouth 3 (three) times a week.          Adrian Prows, MD, [facility] 01/14/2021, 8:59 AM Office: [phone] Fax: [phone] Pager: [phone]

## 2021-01-14 NOTE — Transfer of Care (Signed)
Immediate Anesthesia Transfer of Care Note  Patient: [name]  Procedure(s) Performed: CARDIOVERSION  Patient Location: Endoscopy Unit  Anesthesia Type:MAC  Level of Consciousness: drowsy  Airway & Oxygen Therapy: Patient Spontanous Breathing  Post-op Assessment: Report given to RN and Post -op Vital signs reviewed and stable  Post vital signs: Reviewed and stable  Last Vitals:  Vitals Value Taken Time  BP    Temp    Pulse    Resp    SpO2      Last Pain:  Vitals:   01/14/21 0808  TempSrc: Temporal  PainSc: 0-No pain         Complications: No notable events documented.

## 2021-01-14 NOTE — Anesthesia Preprocedure Evaluation (Addendum)
Anesthesia Evaluation  Patient identified by MRN, date of birth, ID band Patient awake    Reviewed: Allergy & Precautions, NPO status , Patient's Chart, lab work & pertinent test results  Airway Mallampati: I  TM Distance: >3 FB Neck ROM: Full    Dental  (+) Edentulous Upper, Edentulous Lower   Pulmonary neg pulmonary ROS,    Pulmonary exam normal        Cardiovascular hypertension, Pt. on medications  Rhythm:Irregular Rate:Abnormal  Echo:  Normal LV systolic function with visual EF 60-65%. Left ventricle cavity  is normal in size. Mild left ventricular hypertrophy. D-shaped septum in  systole and diastole suggestive of RV pressure and volume overload.   Normal global wall motion. Doppler evidence of grade III (restrictive)  diastolic dysfunction, elevated LAP.  Left atrial cavity is mildly dilated. Atrial septum bows from right to  left, suggestive of elevated RAP.  Right atrial cavity is visually dilated.  Right ventricle cavity is visually dilated and normal function.  Mild (Grade I) aortic regurgitation.  Mild mitral valve annulus calcification. Moderate (Grade III) mitral  regurgitation.  Severe tricuspid regurgitation. Moderate pulmonary hypertension. RVSP  measures 51 mmHg (may be unappreciated given RAE and dilated IVC leading  to equalization of pressures).  Moderate pulmonic regurgitation.    Neuro/Psych negative psych ROS   GI/Hepatic negative GI ROS, Neg liver ROS,   Endo/Other  diabetes, Type 2, Oral Hypoglycemic Agents, Insulin Dependent  Renal/GU Renal disease     Musculoskeletal negative musculoskeletal ROS (+)   Abdominal Normal abdominal exam  (+)   Peds  Hematology negative hematology ROS (+)   Anesthesia Other Findings   Reproductive/Obstetrics                            Anesthesia Physical Anesthesia Plan  ASA: 3  Anesthesia Plan: General   Post-op Pain  Management:    Induction: Intravenous  PONV Risk Score and Plan: 0  Airway Management Planned: Natural Airway and Simple Face Mask  Additional Equipment: None  Intra-op Plan:   Post-operative Plan:   Informed Consent: I have reviewed the patients History and Physical, chart, labs and discussed the procedure including the risks, benefits and alternatives for the proposed anesthesia with the patient or authorized representative who has indicated his/her understanding and acceptance.       Plan Discussed with: CRNA  Anesthesia Plan Comments:        Anesthesia Quick Evaluation

## 2021-01-14 NOTE — Anesthesia Postprocedure Evaluation (Signed)
Anesthesia Post Note  Patient: [name]  Procedure(s) Performed: CARDIOVERSION     Patient location during evaluation: PACU Anesthesia Type: General Level of consciousness: awake and alert Pain management: pain level controlled Vital Signs Assessment: post-procedure vital signs reviewed and stable Respiratory status: spontaneous breathing, nonlabored ventilation, respiratory function stable and patient connected to nasal cannula oxygen Cardiovascular status: blood pressure returned to baseline and stable Postop Assessment: no apparent nausea or vomiting Anesthetic complications: no   No notable events documented.  Last Vitals:  Vitals:   01/14/21 0905 01/14/21 0917  BP: 121/65 130/70  Pulse: 65 70  Resp: 16 15  Temp:    SpO2: 97% 99%    Last Pain:  Vitals:   01/14/21 0917  TempSrc:   PainSc: 0-No pain                 Effie Berkshire

## 2021-01-15 ENCOUNTER — Encounter: Payer: Self-pay | Admitting: Student

## 2021-01-15 ENCOUNTER — Ambulatory Visit: Payer: Medicare HMO | Admitting: Student

## 2021-01-15 VITALS — BP 158/76 | HR 99 | Temp 98.3°F | Ht 64.0 in | Wt 184.6 lb

## 2021-01-15 DIAGNOSIS — R059 Cough, unspecified: Secondary | ICD-10-CM

## 2021-01-15 DIAGNOSIS — R220 Localized swelling, mass and lump, head: Secondary | ICD-10-CM

## 2021-01-15 DIAGNOSIS — R0609 Other forms of dyspnea: Secondary | ICD-10-CM

## 2021-01-15 DIAGNOSIS — R0600 Dyspnea, unspecified: Secondary | ICD-10-CM

## 2021-01-15 DIAGNOSIS — I484 Atypical atrial flutter: Secondary | ICD-10-CM

## 2021-01-15 NOTE — Progress Notes (Signed)
Primary Physician/Referring:  Trey Sailors, PA  Patient ID: [name], female    DOB: 1940/12/31, 80 y.o.   MRN: [id number]  Chief Complaint  Patient presents with   Edema   Follow-up   Hospitalization Follow-up   HPI:    [name]  is a 80 y.o. 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  Admitted 08/2020 with influenza and found to have new onset atrial fibrillation.  Patient was discharged with Eliquis and diltiazem.  She presented to our office 10/16/2020 for follow-up and EKG revealed slow atypical atrial flutter which persisted through [facility] cardiac monitor.  Patient struggled with medication compliance, however when this improved and she was anticoagulated recommended cardioversion.  Patient underwent successful direct-current cardioversion 01/14/2021 (yesterday).  Patient presents today for urgent visit with concerns of face swelling following cardioversion yesterday.  Patient states she woke up this morning and her face was swollen, however it has resolved over the last few hours.  She has also been experiencing worsening cough over the last 2 weeks which is now associated with right lower quadrant abdominal pain when she coughs.  She continues to have dyspnea on exertion which is stable compared to previous visit.  Denies chest pain, palpitations, syncope, near syncope, dizziness.  Denies tongue swelling or difficulty breathing.  Past Medical History:  Diagnosis Date   Diabetes mellitus without complication ([place])    Hypertension    Past Surgical History:  Procedure Laterality Date   CARDIOVERSION N/A 01/14/2021   Procedure: CARDIOVERSION;  Surgeon: Adrian Prows, MD;  Location: [facility];  Service: Cardiovascular;  Laterality: N/A;   TONSILLECTOMY     History  reviewed. No pertinent family history.  No history of premature coronary artery disease or sudden cardiac death in her family. Social History   Tobacco Use   Smoking status: Never   Smokeless tobacco: Never  Substance Use Topics   Alcohol use: No   Marital Status: Divorced  ROS  Review of Systems  Cardiovascular:  Negative for chest pain, claudication, leg swelling, near-syncope, orthopnea, palpitations, paroxysmal nocturnal dyspnea and syncope.  Respiratory:  Positive for cough. Negative for shortness of breath.   Neurological:  Negative for dizziness.  Objective  Blood pressure (!) 158/76, pulse 99, temperature 98.3 F (36.8 C), height '5\' 4"'  (1.626 m), weight 184 lb 9.6 oz (83.7 kg), SpO2 97 %.  Vitals with BMI 01/15/2021 01/15/2021 01/14/2021  Height - '5\' 4"'  -  Weight - 184 lbs 10 oz -  BMI - 93.23 -  Systolic 557 322 025  Diastolic 76 84 70  Pulse 99 90 70     Physical Exam Vitals reviewed.  Constitutional:      Comments: Mildly obese in no acute distress.  Cardiovascular:     Rate and Rhythm: Normal rate and regular rhythm.     Pulses:          Carotid pulses are 2+ on the right side and 2+ on the left side.      Femoral pulses are 2+ on the right side and 2+ on the left side.      Dorsalis pedis pulses are 2+ on the right side and 1+ on the left side.       Posterior tibial pulses are 2+ on the right side and 0 on the left side.     Heart sounds:  Normal heart sounds. No murmur heard.   No gallop.     Comments: Popliteal pulse difficult to feel due to patient's body habitus.  No JVD.   Pulmonary:     Effort: Pulmonary effort is normal.     Breath sounds: Wheezing (expiratory) present. No rales.  Abdominal:     Comments: Obese. Pannus present  Musculoskeletal:     Right lower leg: Edema (trace) present.     Left lower leg: Edema (trace) present.  Skin:    Comments: Hair loss and atrophic changes bilateral lower legs.  Neurological:     Mental Status: She is  alert.   Laboratory examination:   Recent Labs    09/06/20 0605 09/07/20 0437 09/08/20 0437 09/26/20 1413 11/07/20 1415 01/13/21 1143  NA 138 137 139 137 139 133*  K 4.0 3.7 3.2* 5.0 4.8 5.0  CL 103 102 105 101 103 98  CO2 25 21* '27 24 21 20  ' GLUCOSE 199* 197* 117* 244* 256* 194*  BUN '21 22 21 21 18 17  ' CREATININE 1.46* 1.28* 1.30* 1.59* 1.75* 1.95*  CALCIUM 9.0 8.7* 8.4* 9.2 8.9 8.7  GFRNONAA 37* 43* 42*  --   --   --    estimated creatinine clearance is 24.5 mL/min (A) (by C-G formula based on SCr of 1.95 mg/dL (H)).  CMP Latest Ref Rng & Units 01/13/2021 11/07/2020 09/26/2020  Glucose 65 - 99 mg/dL 194(H) 256(H) 244(H)  BUN 8 - 27 mg/dL '17 18 21  ' Creatinine 0.57 - 1.00 mg/dL 1.95(H) 1.75(H) 1.59(H)  Sodium 134 - 144 mmol/L 133(L) 139 137  Potassium 3.5 - 5.2 mmol/L 5.0 4.8 5.0  Chloride 96 - 106 mmol/L 98 103 101  CO2 20 - 29 mmol/L '20 21 24  ' Calcium 8.7 - 10.3 mg/dL 8.7 8.9 9.2  Total Protein 6.5 - 8.1 g/dL - - -  Total Bilirubin 0.3 - 1.2 mg/dL - - -  Alkaline Phos 38 - 126 U/L - - -  AST 15 - 41 U/L - - -  ALT 0 - 44 U/L - - -   CBC Latest Ref Rng & Units 01/13/2021 09/07/2020 09/06/2020  WBC 3.4 - 10.8 x10E3/uL 4.6 5.0 5.9  Hemoglobin 11.1 - 15.9 g/dL 9.3(L) 10.7(L) 10.2(L)  Hematocrit 34.0 - 46.6 % 28.5(L) 33.3(L) 32.2(L)  Platelets 150 - 450 x10E3/uL 165 114(L) 128(L)    Lipid Panel No results for input(s): CHOL, TRIG, LDLCALC, VLDL, HDL, CHOLHDL, LDLDIRECT in the last 8760 hours.  HEMOGLOBIN A1C Lab Results  Component Value Date   HGBA1C 10.4 (H) 09/06/2020   MPG 251.78 09/06/2020   TSH Recent Labs    09/07/20 1255  TSH 1.023    BNP    Component Value Date/Time   BNP 321.4 (H) 09/26/2020 1407   BNP 628.4 (H) 04/25/2019 1410    ProBNP No results found for: PROBNP  External labs:   05/15/2020: Serum glucose 184 mg, BUN 26, creatinine 1.60, EGFR 35 mL, sodium 137, potassium 4.3, CMP otherwise normal. A1c 10.0%. BNP 08/10/1975. Total cholesterol  224, triglycerides 46, HDL 113, LDL 103.  Non-HDL cholesterol 111. Labs 10/20/2016: TSH normal.  Allergies   Allergies  Allergen Reactions   Peanut-Containing Drug Products     Stomach pain   Amlodipine Swelling    Medications Prior to Visit:   Outpatient Medications Prior to Visit  Medication Sig Dispense Refill   Accu-Chek FastClix Lancets MISC See admin instructions.     apixaban (ELIQUIS) 5 MG TABS tablet  Take 1 tablet (5 mg total) by mouth 2 (two) times daily. 60 tablet 0   BD PEN NEEDLE NANO 2ND GEN 32G X 4 MM MISC 3 (three) times daily. as directed     Blood Glucose Monitoring Suppl (ACCU-CHEK GUIDE ME) w/Device KIT See admin instructions.     Coenzyme Q10 200 MG capsule Take 200 mg by mouth 3 (three) times a week.     D-Mannose 500 MG CAPS Take 1 capsule by mouth daily.     diltiazem (CARDIZEM CD) 120 MG 24 hr capsule Take 1 capsule (120 mg total) by mouth daily. 30 capsule 2   furosemide (LASIX) 20 MG tablet Take 40 mg by mouth at bedtime.     glimepiride (AMARYL) 2 MG tablet Take 2 mg by mouth daily.     glucose blood test strip TEST BLOOD SUGARS 3 4 TIMES DAILY.     hydrALAZINE (APRESOLINE) 50 MG tablet TAKE 1 TABLET(50 MG) BY MOUTH THREE TIMES DAILY 270 tablet 3   insulin lispro (HUMALOG) 100 UNIT/ML KwikPen Inject 15 Units into the skin at bedtime.     isosorbide dinitrate (ISORDIL) 30 MG tablet Take 30 mg by mouth 3 (three) times daily.     Menthol-Methyl Salicylate (MUSCLE RUB EX) Apply 1 application topically daily as needed (leg cramps).     MISC NATURAL PRODUCTS PO Take 1 capsule by mouth 3 (three) times a week. Hawkeye: HEMP Capsules     MISC NATURAL PRODUCTS PO Take 1 capsule by mouth daily. Ultimate Friendly Flora Probiotic     Multiple Vitamins-Minerals (MULTIVITAMIN ADULTS) TABS Take 1 tablet by mouth daily.     pravastatin (PRAVACHOL) 20 MG tablet Take 1 tablet (20 mg total) by mouth daily. (Patient taking differently: Take 20 mg by mouth at bedtime.) 90 tablet  1   VITAMIN D PO Take 1 capsule by mouth 3 (three) times a week.     No facility-administered medications prior to visit.   Final Medications at End of Visit    Current Meds  Medication Sig   Accu-Chek FastClix Lancets MISC See admin instructions.   apixaban (ELIQUIS) 5 MG TABS tablet Take 1 tablet (5 mg total) by mouth 2 (two) times daily.   BD PEN NEEDLE NANO 2ND GEN 32G X 4 MM MISC 3 (three) times daily. as directed   Blood Glucose Monitoring Suppl (ACCU-CHEK GUIDE ME) w/Device KIT See admin instructions.   Coenzyme Q10 200 MG capsule Take 200 mg by mouth 3 (three) times a week.   D-Mannose 500 MG CAPS Take 1 capsule by mouth daily.   diltiazem (CARDIZEM CD) 120 MG 24 hr capsule Take 1 capsule (120 mg total) by mouth daily.   furosemide (LASIX) 20 MG tablet Take 40 mg by mouth at bedtime.   glimepiride (AMARYL) 2 MG tablet Take 2 mg by mouth daily.   glucose blood test strip TEST BLOOD SUGARS 3 4 TIMES DAILY.   hydrALAZINE (APRESOLINE) 50 MG tablet TAKE 1 TABLET(50 MG) BY MOUTH THREE TIMES DAILY   insulin lispro (HUMALOG) 100 UNIT/ML KwikPen Inject 15 Units into the skin at bedtime.   isosorbide dinitrate (ISORDIL) 30 MG tablet Take 30 mg by mouth 3 (three) times daily.   Menthol-Methyl Salicylate (MUSCLE RUB EX) Apply 1 application topically daily as needed (leg cramps).   MISC NATURAL PRODUCTS PO Take 1 capsule by mouth 3 (three) times a week. Hawkeye: HEMP Capsules   MISC NATURAL PRODUCTS PO Take 1 capsule by mouth daily. Ultimate Friendly  Flora Probiotic   Multiple Vitamins-Minerals (MULTIVITAMIN ADULTS) TABS Take 1 tablet by mouth daily.   pravastatin (PRAVACHOL) 20 MG tablet Take 1 tablet (20 mg total) by mouth daily. (Patient taking differently: Take 20 mg by mouth at bedtime.)   VITAMIN D PO Take 1 capsule by mouth 3 (three) times a week.   Radiology:   Chest x-Terry single view 04/25/2019: Mediastinum hilar structures normal. Cardiomegaly. Mild pulmonary venous congestion  bilateral interstitial prominence. Findings suggest mild CHF.  Calcified pulmonary nodules noted consistent with old calcified granulomas. Small right pleural effusion cannot be Excluded.  Cardiac Studies:   ABI 06/19/2020:  This exam reveals normal perfusion of the right and left lower extremity  (ABI 1.00). Mildly abnormal biphasic waveform noted at the ankles.  There  is mild plaque evident in the lower extremity vessels at the ankles.  PCV ECHOCARDIOGRAM COMPLETE 06/19/2020 Left ventricle cavity is normal in size and wall thickness. Normal global wall motion. Normal LV systolic function with EF 55%. Doppler evidence of grade II (pseudonormal) diastolic dysfunction, elevated LAP. Calculated EF 55%. Left atrial cavity is mildly dilated. Right atrial cavity is moderately dilated. Right ventricle cavity is moderately dilated. Moderately reduced right ventricular function. Trileaflet aortic valve with mild aortic valve leaflet calcification. Trace aortic stenosis. Mild (Grade I) aortic regurgitation. Moderate (Grade III) mitral regurgitation. Severe tricuspid regurgitation. Moderate pulmonary hypertension. Estimated pulmonary artery systolic pressure 52 mmHg.   PCV MYOCARDIAL PERFUSION WITH LEXISCAN 06/11/2020 Nondiagnostic ECG stress. Perfusion imaging study demonstrates decreased uptake in the inferior wall suggestive of soft tissue attenuation.  Ischemia in this region cannot be completely excluded. Overall LV systolic function is normal without regional wall motion abnormalities. Stress LV EF: 82%. Low risk.  Ambulatory cardiac telemetry 10/16/20-6/30-22: Patient in atypical atrial flutter 100% of the time. Heart rate 53-130 bpm with average 87 bpm. No symptoms reported. Rare PVCs. Dr. Einar Gip assisted in interpretation of findings.  EKG:  12/30/2020: Atypical atrial flutter at a rate of 104 bpm.  Left axis.  right bundle branch block.  10/16/2020: Slow atypical atrial flutter at a  rate of 85 bpm.  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  09/07/2020: Atrial fibrillation, 104 bpm, right bundle branch block, left anterior fascicular block, poor R wave progression, without underlying injury pattern.  EKG 03/19/2020: Sinus rhythm with first-degree AV block at rate of 80 bpm, left axis deviation, left anterior fascicular block.  Right bundle branch block.  Trifascicular block.  Assessment     ICD-10-CM   1. Facial swelling  R22.0 Brain natriuretic peptide    2. Atypical atrial flutter ([place]) s/p cardioversion 01/14/21  I48.4     3. Cough  R05.9 Brain natriuretic peptide    DG Chest 2 View    4. Dyspnea on exertion  R06.00 Brain natriuretic peptide       There are no discontinued medications.   No orders of the defined types were placed in this encounter.  This patients CHA2DS2-VASc Score 5 (HTN, DM, age, F) and yearly risk of stroke 7.2%.   Recommendations:   Tonja Jezewski is a 80 y.o. 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  Admitted 08/2020 with influenza and found to have new onset atrial fibrillation.  Patient was discharged with Eliquis and diltiazem.  She presented to our office 10/16/2020 for  follow-up and EKG revealed slow atypical atrial flutter which persisted through [facility] cardiac monitor.  Patient struggled with medication compliance, however when this improved and she was anticoagulated recommended cardioversion.  Patient underwent successful direct-current cardioversion 01/14/2021 (yesterday).  Patient presents today for urgent visit with concerns of face swelling following cardioversion yesterday.  Given patient's wheezing and worsening cough as well as mild bilateral leg edema will obtain BNP.  We will  also have patient take Lasix 40 mg twice daily for the next 3 days, then return to taking Lasix 40 mg once daily.  Given her cough worsening over the last 2 weeks will obtain chest x-Terry.  Patient's symptoms of stay swelling have resolved, however I am concerned she may have had an allergic reaction.  Given her ongoing cough as well as concern for allergic reaction advised patient to follow-up with PCP urgently, patient verbalized understanding and agreement.  Patient's blood pressure is elevated in the office today, will hold off on making changes until recheck at office visit in 1 week.  Counseled patient regarding signs and symptoms that would warrant urgent or emergent evaluation, particularly review of facial swelling this morning.  Follow-up as previously scheduled in 1 week with repeat EKG following cardioversion.   Alethia Berthold, PA-C 01/15/2021, 3:52 PM Office: [phone]

## 2021-01-16 ENCOUNTER — Emergency Department (HOSPITAL_COMMUNITY)
Admission: EM | Admit: 2021-01-16 | Discharge: 2021-01-16 | Disposition: A | Discharge status: Home / Self Care | Payer: Medicare HMO | Attending: Student | Admitting: Student

## 2021-01-16 ENCOUNTER — Other Ambulatory Visit: Payer: Self-pay

## 2021-01-16 ENCOUNTER — Emergency Department (HOSPITAL_COMMUNITY): Payer: Medicare HMO

## 2021-01-16 DIAGNOSIS — E1122 Type 2 diabetes mellitus with diabetic chronic kidney disease: Secondary | ICD-10-CM | POA: Insufficient documentation

## 2021-01-16 DIAGNOSIS — I509 Heart failure, unspecified: Secondary | ICD-10-CM | POA: Diagnosis not present

## 2021-01-16 DIAGNOSIS — N183 Chronic kidney disease, stage 3 unspecified: Secondary | ICD-10-CM | POA: Insufficient documentation

## 2021-01-16 DIAGNOSIS — E1142 Type 2 diabetes mellitus with diabetic polyneuropathy: Secondary | ICD-10-CM | POA: Insufficient documentation

## 2021-01-16 DIAGNOSIS — Z79899 Other long term (current) drug therapy: Secondary | ICD-10-CM | POA: Insufficient documentation

## 2021-01-16 DIAGNOSIS — Z91010 Allergy to peanuts: Secondary | ICD-10-CM | POA: Diagnosis not present

## 2021-01-16 DIAGNOSIS — Z794 Long term (current) use of insulin: Secondary | ICD-10-CM | POA: Insufficient documentation

## 2021-01-16 DIAGNOSIS — Z7984 Long term (current) use of oral hypoglycemic drugs: Secondary | ICD-10-CM | POA: Insufficient documentation

## 2021-01-16 DIAGNOSIS — I130 Hypertensive heart and chronic kidney disease with heart failure and stage 1 through stage 4 chronic kidney disease, or unspecified chronic kidney disease: Secondary | ICD-10-CM | POA: Insufficient documentation

## 2021-01-16 DIAGNOSIS — Z7901 Long term (current) use of anticoagulants: Secondary | ICD-10-CM | POA: Insufficient documentation

## 2021-01-16 DIAGNOSIS — I4819 Other persistent atrial fibrillation: Secondary | ICD-10-CM | POA: Diagnosis not present

## 2021-01-16 DIAGNOSIS — R0602 Shortness of breath: Secondary | ICD-10-CM | POA: Diagnosis present

## 2021-01-16 DIAGNOSIS — R140 Abdominal distension (gaseous): Secondary | ICD-10-CM

## 2021-01-16 DIAGNOSIS — N200 Calculus of kidney: Secondary | ICD-10-CM | POA: Diagnosis not present

## 2021-01-16 LAB — COMPREHENSIVE METABOLIC PANEL
ALT: 18 U/L (ref 0–44)
AST: 36 U/L (ref 15–41)
Albumin: 4.4 g/dL (ref 3.5–5.0)
Alkaline Phosphatase: 81 U/L (ref 38–126)
Anion gap: 11 (ref 5–15)
BUN: 23 mg/dL (ref 8–23)
CO2: 23 mmol/L (ref 22–32)
Calcium: 9.3 mg/dL (ref 8.9–10.3)
Chloride: 101 mmol/L (ref 98–111)
Creatinine, Ser: 1.87 mg/dL — ABNORMAL HIGH (ref 0.44–1.00)
GFR, Estimated: 27 mL/min — ABNORMAL LOW (ref >60)
Glucose, Bld: 80 mg/dL (ref 70–99)
Potassium: 4.2 mmol/L (ref 3.5–5.1)
Sodium: 135 mmol/L (ref 135–145)
Total Bilirubin: 1 mg/dL (ref 0.3–1.2)
Total Protein: 7.8 g/dL (ref 6.5–8.1)

## 2021-01-16 LAB — CBC WITH DIFFERENTIAL/PLATELET
Abs Immature Granulocytes: 0.03 10*3/uL (ref 0.00–0.07)
Basophils Absolute: 0 10*3/uL (ref 0.0–0.1)
Basophils Relative: 1 %
Eosinophils Absolute: 0.3 10*3/uL (ref 0.0–0.5)
Eosinophils Relative: 5 %
HCT: 28.3 % — ABNORMAL LOW (ref 36.0–46.0)
Hemoglobin: 8.9 g/dL — ABNORMAL LOW (ref 12.0–15.0)
Immature Granulocytes: 1 %
Lymphocytes Relative: 18 %
Lymphs Abs: 1 10*3/uL (ref 0.7–4.0)
MCH: 29.2 pg (ref 26.0–34.0)
MCHC: 31.4 g/dL (ref 30.0–36.0)
MCV: 92.8 fL (ref 80.0–100.0)
Monocytes Absolute: 0.9 10*3/uL (ref 0.1–1.0)
Monocytes Relative: 15 %
Neutro Abs: 3.6 10*3/uL (ref 1.7–7.7)
Neutrophils Relative %: 60 %
Platelets: 165 10*3/uL (ref 150–400)
RBC: 3.05 MIL/uL — ABNORMAL LOW (ref 3.87–5.11)
RDW: 15.8 % — ABNORMAL HIGH (ref 11.5–15.5)
WBC: 5.9 10*3/uL (ref 4.0–10.5)
nRBC: 0 % (ref 0.0–0.2)

## 2021-01-16 LAB — BRAIN NATRIURETIC PEPTIDE
B Natriuretic Peptide: 596.2 pg/mL — ABNORMAL HIGH (ref 0.0–100.0)
BNP: 465.6 pg/mL — ABNORMAL HIGH (ref 0.0–100.0)

## 2021-01-16 LAB — URINALYSIS, ROUTINE W REFLEX MICROSCOPIC
Bilirubin Urine: NEGATIVE
Glucose, UA: NEGATIVE mg/dL
Hgb urine dipstick: NEGATIVE
Ketones, ur: NEGATIVE mg/dL
Leukocytes,Ua: NEGATIVE
Nitrite: NEGATIVE
Specific Gravity, Urine: <1.005 — ABNORMAL LOW (ref 1.005–1.030)
pH: 6.5 (ref 5.0–8.0)

## 2021-01-16 LAB — TROPONIN I (HIGH SENSITIVITY)
Troponin I (High Sensitivity): 38 ng/L — ABNORMAL HIGH (ref <18)
Troponin I (High Sensitivity): 36 ng/L — ABNORMAL HIGH (ref <18)

## 2021-01-16 NOTE — ED Triage Notes (Addendum)
Pt states she was sent to ED by pcp for swelling in her abdomin and sob. Pt had to have a cardioversion a couple days ago for her A-fib. Pt denies cp

## 2021-01-16 NOTE — ED Provider Notes (Signed)
[facility] DEPT Provider Note   CSN: [id number] Arrival date & time: 01/16/21  1612     History Chief Complaint  Patient presents with   abdominal swelling   Shortness of Breath    [name] is a 80 y.o. female with PMH DM, HTN, persistent atrial flutter status post recent cardioversion, T2DM, CKD 3 who presents emergency department for evaluation of shortness of breath and abdominal swelling.  She states her symptoms have been present for approximately 2 weeks and have not worsened.  Her Lasix was recently increased after to 40 mg once daily for 3 days after seeing her cardiologist yesterday.  He saw her primary care physician today who sent her to the emergency department due to persistent shortness of breath and abdominal swelling.  She denies chest pain, diarrhea, cough, orthopnea, headache, fever or other systemic symptoms.   Shortness of Breath Associated symptoms: no abdominal pain, no chest pain, no cough, no ear pain, no fever, no rash, no sore throat and no vomiting       Past Medical History:  Diagnosis Date   Diabetes mellitus without complication ([place])    Hypertension     Patient Active Problem List   Diagnosis Date Noted   Influenza A 09/06/2020   Persistent atrial fibrillation ([place]) 09/06/2020   DM (diabetes mellitus), type 2 with renal complications ([place]) 59/45/8592   Prolonged QT interval 09/06/2020   Abnormal gait 06/18/2020   Heart failure ([place]) 06/18/2020   Hyperglycemia due to type 2 diabetes mellitus ([place]) 06/18/2020   Hypertensive heart disease without congestive heart failure 06/18/2020   Mixed incontinence 06/18/2020   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/2020   Pure hypercholesterolemia 06/18/2020   Stage 3 chronic kidney disease ([place]) 06/18/2020   Type 2 diabetes mellitus without complications ([place]) 92/44/6286   Vitamin D deficiency 06/18/2020    Essential hypertension 05/08/2019   Hyperlipidemia 05/08/2019   Bilateral lower extremity edema 05/08/2019   Failed hearing screening 02/02/2018   Impacted cerumen of right ear 02/02/2018   Otalgia, right 02/02/2018    Past Surgical History:  Procedure Laterality Date   CARDIOVERSION N/A 01/14/2021   Procedure: CARDIOVERSION;  Surgeon: Adrian Prows, MD;  Location: [facility] ENDOSCOPY;  Service: Cardiovascular;  Laterality: N/A;   TONSILLECTOMY       OB History   No obstetric history on file.     No family history on file.  Social History   Tobacco Use   Smoking status: Never   Smokeless tobacco: Never  Vaping Use   Vaping Use: Never used  Substance Use Topics   Alcohol use: No   Drug use: No    Home Medications Prior to Admission medications   Medication Sig Start Date End Date Taking? Authorizing Provider  Accu-Chek FastClix Lancets MISC See admin instructions. 01/16/19   [provider]  apixaban (ELIQUIS) 5 MG TABS tablet Take 1 tablet (5 mg total) by mouth 2 (two) times daily. 09/08/20   Jennye Boroughs, MD  BD PEN NEEDLE NANO 2ND GEN 32G X 4 MM MISC 3 (three) times daily. as directed 08/13/19   [provider]  Blood Glucose Monitoring Suppl (ACCU-CHEK GUIDE ME) w/Device KIT See admin instructions. 12/11/18   [provider]  Coenzyme Q10 200 MG capsule Take 200 mg by mouth 3 (three) times a week.    [provider]  D-Mannose 500 MG CAPS Take 1 capsule by mouth daily.  [provider]  diltiazem (CARDIZEM CD) 120 MG 24 hr capsule Take 1 capsule (120 mg total) by mouth daily. 12/16/20   Cantwell, Celeste C, PA-C  furosemide (LASIX) 20 MG tablet Take 40 mg by mouth at bedtime.    [provider]  glimepiride (AMARYL) 2 MG tablet Take 2 mg by mouth daily. 01/02/21   [provider]  glucose blood test strip TEST BLOOD SUGARS 3 4 TIMES DAILY. 10/27/17   [provider]  hydrALAZINE (APRESOLINE) 50 MG tablet TAKE  1 TABLET(50 MG) BY MOUTH THREE TIMES DAILY 11/14/20   Cantwell, Celeste C, PA-C  insulin lispro (HUMALOG) 100 UNIT/ML KwikPen Inject 15 Units into the skin at bedtime. 08/14/19   [provider]  isosorbide dinitrate (ISORDIL) 30 MG tablet Take 30 mg by mouth 3 (three) times daily.    [provider]  Menthol-Methyl Salicylate (MUSCLE RUB EX) Apply 1 application topically daily as needed (leg cramps).    [provider]  MISC NATURAL PRODUCTS PO Take 1 capsule by mouth 3 (three) times a week. [place]: HEMP Capsules    [provider]  MISC NATURAL PRODUCTS PO Take 1 capsule by mouth daily. Ultimate Friendly Flora Probiotic    [provider]  Multiple Vitamins-Minerals (MULTIVITAMIN ADULTS) TABS Take 1 tablet by mouth daily.    [provider]  pravastatin (PRAVACHOL) 20 MG tablet Take 1 tablet (20 mg total) by mouth daily. Patient taking differently: Take 20 mg by mouth at bedtime. 12/16/20   Cantwell, Celeste C, PA-C  VITAMIN D PO Take 1 capsule by mouth 3 (three) times a week.    [provider]    Allergies    Peanut-containing drug products and Amlodipine  Review of Systems   Review of Systems  Constitutional:  Negative for chills and fever.  HENT:  Negative for ear pain and sore throat.   Eyes:  Negative for pain and visual disturbance.  Respiratory:  Positive for shortness of breath. Negative for cough.   Cardiovascular:  Negative for chest pain and palpitations.  Gastrointestinal:  Positive for abdominal distention. Negative for abdominal pain and vomiting.  Genitourinary:  Negative for dysuria and hematuria.  Musculoskeletal:  Negative for arthralgias and back pain.  Skin:  Negative for color change and rash.  Neurological:  Negative for seizures and syncope.  All other systems reviewed and are negative.  Physical Exam Updated Vital Signs BP (!) 142/81   Pulse (!) 101   Temp 97.9 F (36.6 C) (Oral)   Resp 16    SpO2 98%   Physical Exam Vitals and nursing note reviewed.  Constitutional:      General: She is not in acute distress.    Appearance: She is well-developed.  HENT:     Head: Normocephalic and atraumatic.  Eyes:     Conjunctiva/sclera: Conjunctivae normal.  Cardiovascular:     Rate and Rhythm: Normal rate and regular rhythm.     Heart sounds: No murmur heard. Pulmonary:     Effort: Pulmonary effort is normal. No respiratory distress.     Breath sounds: Normal breath sounds.  Abdominal:     Palpations: Abdomen is soft.     Tenderness: There is abdominal tenderness (LLQ).  Musculoskeletal:     Cervical back: Neck supple.  Skin:    General: Skin is warm and dry.  Neurological:     Mental Status: She is alert.    ED Results / Procedures / Treatments   Labs (all  labs ordered are listed, but only abnormal results are displayed) Labs Reviewed  CBC WITH DIFFERENTIAL/PLATELET - Abnormal; Notable for the following components:      Result Value   RBC 3.05 (*)    Hemoglobin 8.9 (*)    HCT 28.3 (*)    RDW 15.8 (*)    All other components within normal limits  COMPREHENSIVE METABOLIC PANEL - Abnormal; Notable for the following components:   Creatinine, Ser 1.87 (*)    GFR, Estimated 27 (*)    All other components within normal limits  BRAIN NATRIURETIC PEPTIDE - Abnormal; Notable for the following components:   B Natriuretic Peptide 596.2 (*)    All other components within normal limits  URINALYSIS, ROUTINE W REFLEX MICROSCOPIC - Abnormal; Notable for the following components:   Color, Urine YELLOW (*)    APPearance CLEAR (*)    Specific Gravity, Urine <1.005 (*)    Protein, ur TRACE (*)    Bacteria, UA RARE (*)    All other components within normal limits  TROPONIN I (HIGH SENSITIVITY) - Abnormal; Notable for the following components:   Troponin I (High Sensitivity) 38 (*)    All other components within normal limits  TROPONIN I (HIGH SENSITIVITY) - Abnormal; Notable for  the following components:   Troponin I (High Sensitivity) 36 (*)    All other components within normal limits    EKG EKG Interpretation  Date/Time:  Friday January 16 2021 17:37:31 EDT Ventricular Rate:  95 PR Interval:  180 QRS Duration: 112 QT Interval:  376 QTC Calculation: 472 R Axis:   121 Text Interpretation: Sinus rhythm with Premature atrial complexes Low voltage QRS Right bundle branch block Abnormal ECG Confirmed by [place] ([id number]) on 01/16/2021 9:26:27 PM  Radiology CT ABDOMEN PELVIS WO CONTRAST  Result Date: 01/16/2021 CLINICAL DATA:  Left lower quadrant abdominal pain. EXAM: CT ABDOMEN AND PELVIS WITHOUT CONTRAST TECHNIQUE: Multidetector CT imaging of the abdomen and pelvis was performed following the standard protocol without IV contrast. COMPARISON:  None. FINDINGS: Lower chest: There are small bilateral pleural effusions with some atelectasis in both lung bases. Hepatobiliary: The liver and gallbladder appear within normal limits. There is no biliary ductal dilatation. Pancreas: Grossly within normal limits. Spleen: Normal in size without focal abnormality. Adrenals/Urinary Tract: There is mild bilateral renal atrophy. No renal calculi. No hydronephrosis. There are few punctate calculi measuring up to 3 mm in the region of the left retroperitoneum. Mid left ureteral calculi would be difficult to exclude. The adrenal glands appear within normal limits. The bladder is within normal limits. Stomach/Bowel: Stomach is within normal limits. Appendix is not seen. No evidence of bowel wall thickening, distention, or inflammatory changes. Vascular/Lymphatic: Aortic atherosclerosis. No enlarged abdominal or pelvic lymph nodes. Reproductive: There are calcified uterine fibroids measuring up to 16 mm. Adnexa are unremarkable. Other: There is a small amount of ascites throughout the abdomen and pelvis. There is no free intraperitoneal air. There is also diffuse body wall edema. There  are some prominent bilateral inguinal lymph nodes. There is no focal abdominal wall hernia. Musculoskeletal: There is mild compression deformity of the superior endplate of L2 which is age indeterminate, but favored is chronic. There is no retropulsion of fracture fragments. IMPRESSION: 1. Cannot exclude mid left ureteral calculi measuring up to 3 mm. There is no hydronephrosis. 2. Small amount of ascites.  Diffuse body wall edema. 3. Small bilateral pleural effusions. 4. Mild bilateral renal atrophy. 5. Calcified uterine fibroids. 6. Mild compression  deformity superior endplate of L2 age indeterminate, but favored as chronic. Correlate clinically. Electronically Signed   By: Ronney Asters M.D.   On: 01/16/2021 20:53   DG ABD ACUTE 2+V W 1V CHEST  Result Date: 01/16/2021 CLINICAL DATA:  Abdominal swelling and shortness of breath. EXAM: DG ABDOMEN ACUTE WITH 1 VIEW CHEST COMPARISON:  Chest x-ray 09/06/2020. FINDINGS: The heart is borderline enlarged, unchanged. There is no focal lung consolidation, pleural effusion or pneumothorax identified. Calcified nodular density in the right lower lung is unchanged. There is no free air under the diaphragm. Bowel gas pattern is nonobstructive. There are calcifications in the pelvis measuring up to 15 mm which may be related to calcified uterine fibroids. Vascular calcifications are also seen in the pelvis. No acute fractures are identified. Degenerative changes affect both hips. IMPRESSION: 1. No acute cardiopulmonary process. 2. Nonobstructive, nonspecific bowel gas pattern. 3. Likely calcified uterine fibroids. Electronically Signed   By: Ronney Asters M.D.   On: 01/16/2021 18:51    Procedures Procedures   Medications Ordered in ED Medications - No data to display  ED Course  I have reviewed the triage vital signs and the nursing notes.  Pertinent labs & imaging results that were available during my care of the patient were reviewed by me and considered in my  medical decision making (see chart for details).    MDM Rules/Calculators/A&P                           Patient seen in the emergency department for evaluation of multiple complaints as described above.  Physical exam reveals some mild left lower quadrant tenderness but is otherwise unremarkable.  No left CVA tenderness.  Laboratory evaluation reveals a hemoglobin of 8.9 which is baseline for the patient, BNP elevated to 596 which is improvement from labs obtained a year ago.  Initial troponin 38, delta troponin 36.  Creatinine elevated to 1.87.  Urinalysis unremarkable.  CT abdomen pelvis with possible left-sided nephrolithiasis 3 mm, small amount of abdominal ascites with diffuse body wall edema, small bilateral pleural effusions, age-indeterminate L2 likely chronic compression fracture.  On reevaluation, patient states that her shortness of breath has improved as she is actively diuresing fairly significantly here in the emergency department.  She put out at least 1 L while here via pure wick.  She has close cardiology follow-up and the patient is likely having a mild CHF exacerbation and is currently on appropriate diuretic therapy.  She was encouraged to follow-up with her cardiologist early next week and was given strict return precautions in which she voiced understanding.  Patient then discharged. Final Clinical Impression(s) / ED Diagnoses Final diagnoses:  Acute on chronic congestive heart failure, unspecified heart failure type [facility])  Nephrolithiasis    Rx / DC Orders ED Discharge Orders     None        Othel Dicostanzo, Debe Coder, MD 01/17/21 [id number]

## 2021-01-16 NOTE — ED Provider Notes (Signed)
Emergency Medicine Provider Triage Evaluation Note  [name] , a 80 y.o. female  was evaluated in triage.  Pt complains of  shortness of breath and abdominal swelling.  States that shortness of breath has been going on for 1 week and abdominal swelling has been going on for 2 weeks. Patient has history of heart failure and A. fib.  Was cardioverted on Tuesday.  States she has not missed any of her Lasix.  Review of Systems  Positive: Shortness of breath, abdominal swelling, cough Negative: Fever, nausea, vomiting, diarrhea  Physical Exam  BP (!) 157/80   Pulse 95   Temp 97.9 F (36.6 C) (Oral)   Resp 19   SpO2 99%  Gen:   Awake, no distress   Resp:  Normal effort  MSK:   Moves extremities without difficulty, 1+ pitting edema noted in bilateral lower extremities    Medical Decision Making  Medically screening exam initiated at 5:44 PM.  Appropriate orders placed.  Koralee Buehner was informed that the remainder of the evaluation will be completed by another provider, this initial triage assessment does not replace that evaluation, and the importance of remaining in the ED until their evaluation is complete.     Nestor Lewandowsky 01/16/21 1746    Teressa Lower, MD 01/17/21 [phone]

## 2021-01-16 NOTE — Discharge Instructions (Addendum)
You were seen in the emergency department for evaluation of abdominal swelling and shortness of breath.  Your laboratory evaluation here is largely unremarkable we do not see evidence of acute heart damage today.  You appear to be urinating well and this is likely a result of your Lasix that was prescribed by your cardiologist.  Your CAT scan of your abdomen does show some fluid in the abdomen that is likely a result of congestive heart failure.  We can decrease the amount of fluid in your belly by continuing this diuretic therapy.  Your CAT scan also shows a possible kidney stone on the left and as you continue to urinate frequently, it is possible that you may develop pain on the side.  We see no evidence that this is obstructing the kidney or urinary flow and you are safe for discharge at this time, but please return the emergency department immediately if you have new or worsening chest pain, shortness of breath, vomiting, fever, back pain, urinary pain or any other concerning symptoms.  It is important that you continue to take your Lasix and follow-up with your cardiologist this week to monitor for improvement.

## 2021-01-22 NOTE — Progress Notes (Signed)
Please call patient to see if SOB has improved and verify she is back to regular lasix dosing

## 2021-01-23 NOTE — Progress Notes (Signed)
Called and spoke with patient, she has not been having SOB and has been taking regular dose of Lasix.

## 2021-01-23 NOTE — Progress Notes (Signed)
Noted will discuss further at upcoming [place].

## 2021-01-26 NOTE — Progress Notes (Deleted)
Primary Physician/Referring:  Trey Sailors, PA  Patient ID: [name], female    DOB: May 03, 1941, 80 y.o.   MRN: [id number]  No chief complaint on file.  HPI:    [name]  is a 80 y.o. 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  Admitted 08/2020 with influenza and found to have new onset atrial fibrillation.  Patient was discharged with Eliquis and diltiazem.  She presented to our office 10/16/2020 for follow-up and EKG revealed slow atypical atrial flutter which persisted through [facility] cardiac monitor.  Patient struggled with medication compliance, however when this improved and she was anticoagulated and recommended cardioversion.  Patient underwent successful direct-current cardioversion 01/14/2021.  She then presented to our office the following day with dyspnea and worsening swelling therefore advised her to increase Lasix for approximately 3 days.  However patient then presented to emergency department 01/16/2021.  At that time BNP elevated at 596 and troponin mildly elevated but flat, consistent with acute on chronic heart failure.  While in the emergency department patient diuresed approximately 1 L and was advised to continue outpatient regimen as directed at her last office visit here.  Patient now presents for follow-up.***  ***  Patient presents today for urgent visit with concerns of face swelling following cardioversion yesterday.  Patient states she woke up this morning and her face was swollen, however it has resolved over the last few hours.  She has also been experiencing worsening cough over the last 2 weeks which is now associated with right lower quadrant abdominal pain when she coughs.  She continues to have dyspnea on exertion which is stable compared to previous  visit.  Denies chest pain, palpitations, syncope, near syncope, dizziness.  Denies tongue swelling or difficulty breathing.  Past Medical History:  Diagnosis Date   Diabetes mellitus without complication ([place])    Hypertension    Past Surgical History:  Procedure Laterality Date   CARDIOVERSION N/A 01/14/2021   Procedure: CARDIOVERSION;  Surgeon: Adrian Prows, MD;  Location: [place];  Service: Cardiovascular;  Laterality: N/A;   TONSILLECTOMY     No family history on file.  No history of premature coronary artery disease or sudden cardiac death in her family. Social History   Tobacco Use   Smoking status: Never   Smokeless tobacco: Never  Substance Use Topics   Alcohol use: No   Marital Status: Divorced  ROS  Review of Systems  Cardiovascular:  Negative for chest pain, claudication, leg swelling, near-syncope, orthopnea, palpitations, paroxysmal nocturnal dyspnea and syncope.  Respiratory:  Positive for cough. Negative for shortness of breath.   Neurological:  Negative for dizziness.  Objective  There were no vitals taken for this visit.  Vitals with BMI 01/16/2021 01/16/2021 01/16/2021  Height - - -  Weight - - -  BMI - - -  Systolic 027 253 664  Diastolic 81 98 93  Pulse 403 97 99     Physical Exam Vitals reviewed.  Constitutional:      Comments: Mildly obese in no acute distress.  Cardiovascular:     Rate and Rhythm: Normal rate and regular rhythm.     Pulses:          Carotid pulses are 2+ on the right side and 2+ on the left side.      Femoral pulses are 2+ on the right  side and 2+ on the left side.      Dorsalis pedis pulses are 2+ on the right side and 1+ on the left side.       Posterior tibial pulses are 2+ on the right side and 0 on the left side.     Heart sounds: Normal heart sounds. No murmur heard.   No gallop.     Comments: Popliteal pulse difficult to feel due to patient's body habitus.  No JVD.   Pulmonary:     Effort: Pulmonary effort is  normal.     Breath sounds: Wheezing (expiratory) present. No rales.  Abdominal:     Comments: Obese. Pannus present  Musculoskeletal:     Right lower leg: Edema (trace) present.     Left lower leg: Edema (trace) present.  Skin:    Comments: Hair loss and atrophic changes bilateral lower legs.  Neurological:     Mental Status: She is alert.   Laboratory examination:   Recent Labs    09/07/20 0437 09/08/20 0437 09/26/20 1413 11/07/20 1415 01/13/21 1143 01/16/21 1755  NA 137 139   < > 139 133* 135  K 3.7 3.2*   < > 4.8 5.0 4.2  CL 102 105   < > 103 98 101  CO2 21* 27   < > '21 20 23  ' GLUCOSE 197* 117*   < > 256* 194* 80  BUN 22 21   < > '18 17 23  ' CREATININE 1.28* 1.30*   < > 1.75* 1.95* 1.87*  CALCIUM 8.7* 8.4*   < > 8.9 8.7 9.3  GFRNONAA 43* 42*  --   --   --  27*   < > = values in this interval not displayed.    estimated creatinine clearance is 25.5 mL/min (A) (by C-G formula based on SCr of 1.87 mg/dL (H)).  CMP Latest Ref Rng & Units 01/16/2021 01/13/2021 11/07/2020  Glucose 70 - 99 mg/dL 80 194(H) 256(H)  BUN 8 - 23 mg/dL '23 17 18  ' Creatinine 0.44 - 1.00 mg/dL 1.87(H) 1.95(H) 1.75(H)  Sodium 135 - 145 mmol/L 135 133(L) 139  Potassium 3.5 - 5.1 mmol/L 4.2 5.0 4.8  Chloride 98 - 111 mmol/L 101 98 103  CO2 22 - 32 mmol/L '23 20 21  ' Calcium 8.9 - 10.3 mg/dL 9.3 8.7 8.9  Total Protein 6.5 - 8.1 g/dL 7.8 - -  Total Bilirubin 0.3 - 1.2 mg/dL 1.0 - -  Alkaline Phos 38 - 126 U/L 81 - -  AST 15 - 41 U/L 36 - -  ALT 0 - 44 U/L 18 - -   CBC Latest Ref Rng & Units 01/16/2021 01/13/2021 09/07/2020  WBC 4.0 - 10.5 K/uL 5.9 4.6 5.0  Hemoglobin 12.0 - 15.0 g/dL 8.9(L) 9.3(L) 10.7(L)  Hematocrit 36.0 - 46.0 % 28.3(L) 28.5(L) 33.3(L)  Platelets 150 - 400 K/uL 165 165 114(L)    Lipid Panel No results for input(s): CHOL, TRIG, LDLCALC, VLDL, HDL, CHOLHDL, LDLDIRECT in the last 8760 hours.  HEMOGLOBIN A1C Lab Results  Component Value Date   HGBA1C 10.4 (H) 09/06/2020   MPG 251.78  09/06/2020   TSH Recent Labs    09/07/20 1255  TSH 1.023     BNP    Component Value Date/Time   BNP 596.2 (H) 01/16/2021 1755    ProBNP No results found for: PROBNP  External labs:  01/16/2021: BNP 596 High-sensitivity troponin 38--> 36  05/15/2020: Serum glucose 184 mg, BUN 26, creatinine 1.60, EGFR  35 mL, sodium 137, potassium 4.3, CMP otherwise normal. A1c 10.0%. BNP 08/10/1975. Total cholesterol 224, triglycerides 46, HDL 113, LDL 103.  Non-HDL cholesterol 111. Labs 10/20/2016: TSH normal.  Allergies   Allergies  Allergen Reactions   Peanut-Containing Drug Products     Stomach pain   Amlodipine Swelling    Medications Prior to Visit:   Outpatient Medications Prior to Visit  Medication Sig Dispense Refill   Accu-Chek FastClix Lancets MISC See admin instructions.     apixaban (ELIQUIS) 5 MG TABS tablet Take 1 tablet (5 mg total) by mouth 2 (two) times daily. 60 tablet 0   BD PEN NEEDLE NANO 2ND GEN 32G X 4 MM MISC 3 (three) times daily. as directed     Blood Glucose Monitoring Suppl (ACCU-CHEK GUIDE ME) w/Device KIT See admin instructions.     Coenzyme Q10 200 MG capsule Take 200 mg by mouth 3 (three) times a week.     D-Mannose 500 MG CAPS Take 1 capsule by mouth daily.     diltiazem (CARDIZEM CD) 120 MG 24 hr capsule Take 1 capsule (120 mg total) by mouth daily. 30 capsule 2   furosemide (LASIX) 20 MG tablet Take 40 mg by mouth at bedtime.     glimepiride (AMARYL) 2 MG tablet Take 2 mg by mouth daily.     glucose blood test strip TEST BLOOD SUGARS 3 4 TIMES DAILY.     hydrALAZINE (APRESOLINE) 50 MG tablet TAKE 1 TABLET(50 MG) BY MOUTH THREE TIMES DAILY 270 tablet 3   insulin lispro (HUMALOG) 100 UNIT/ML KwikPen Inject 15 Units into the skin at bedtime.     isosorbide dinitrate (ISORDIL) 30 MG tablet Take 30 mg by mouth 3 (three) times daily.     Menthol-Methyl Salicylate (MUSCLE RUB EX) Apply 1 application topically daily as needed (leg cramps).     MISC  NATURAL PRODUCTS PO Take 1 capsule by mouth 3 (three) times a week. Hawkeye: HEMP Capsules     MISC NATURAL PRODUCTS PO Take 1 capsule by mouth daily. Ultimate Friendly Flora Probiotic     Multiple Vitamins-Minerals (MULTIVITAMIN ADULTS) TABS Take 1 tablet by mouth daily.     pravastatin (PRAVACHOL) 20 MG tablet Take 1 tablet (20 mg total) by mouth daily. (Patient taking differently: Take 20 mg by mouth at bedtime.) 90 tablet 1   VITAMIN D PO Take 1 capsule by mouth 3 (three) times a week.     No facility-administered medications prior to visit.   Final Medications at End of Visit    No outpatient medications have been marked as taking for the 01/27/21 encounter (Appointment) with Rayetta Pigg, Loranda Mastel C, PA-C.   Radiology:   Chest x-ray single view 04/25/2019: Mediastinum hilar structures normal. Cardiomegaly. Mild pulmonary venous congestion bilateral interstitial prominence. Findings suggest mild CHF.  Calcified pulmonary nodules noted consistent with old calcified granulomas. Small right pleural effusion cannot be Excluded.  Cardiac Studies:   ABI 06/19/2020:  This exam reveals normal perfusion of the right and left lower extremity  (ABI 1.00). Mildly abnormal biphasic waveform noted at the ankles.  There  is mild plaque evident in the lower extremity vessels at the ankles.  PCV ECHOCARDIOGRAM COMPLETE 06/19/2020 Left ventricle cavity is normal in size and wall thickness. Normal global wall motion. Normal LV systolic function with EF 55%. Doppler evidence of grade II (pseudonormal) diastolic dysfunction, elevated LAP. Calculated EF 55%. Left atrial cavity is mildly dilated. Right atrial cavity is moderately dilated. Right ventricle cavity is  moderately dilated. Moderately reduced right ventricular function. Trileaflet aortic valve with mild aortic valve leaflet calcification. Trace aortic stenosis. Mild (Grade I) aortic regurgitation. Moderate (Grade III) mitral regurgitation. Severe  tricuspid regurgitation. Moderate pulmonary hypertension. Estimated pulmonary artery systolic pressure 52 mmHg.   PCV MYOCARDIAL PERFUSION WITH LEXISCAN 06/11/2020 Nondiagnostic ECG stress. Perfusion imaging study demonstrates decreased uptake in the inferior wall suggestive of soft tissue attenuation.  Ischemia in this region cannot be completely excluded. Overall LV systolic function is normal without regional wall motion abnormalities. Stress LV EF: 82%. Low risk.  Ambulatory cardiac telemetry 10/16/20-6/30-22: Patient in atypical atrial flutter 100% of the time. Heart rate 53-130 bpm with average 87 bpm. No symptoms reported. Rare PVCs. Dr. Einar Gip assisted in interpretation of findings.  EKG:  12/30/2020: Atypical atrial flutter at a rate of 104 bpm.  Left axis.  right bundle branch block.  10/16/2020: Slow atypical atrial flutter at a rate of 85 bpm.  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  09/07/2020: Atrial fibrillation, 104 bpm, right bundle branch block, left anterior fascicular block, poor R wave progression, without underlying injury pattern.  EKG 03/19/2020: Sinus rhythm with first-degree AV block at rate of 80 bpm, left axis deviation, left anterior fascicular block.  Right bundle branch block.  Trifascicular block.  Assessment   No diagnosis found.    There are no discontinued medications.   No orders of the defined types were placed in this encounter.  This patients CHA2DS2-VASc Score 5 (HTN, DM, age, F) and yearly risk of stroke 7.2%.   Recommendations:   Petronella Shuford is a 80 y.o. 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  Admitted 08/2020 with influenza and found to have new onset atrial fibrillation.  Patient was  discharged with Eliquis and diltiazem.  She presented to our office 10/16/2020 for follow-up and EKG revealed slow atypical atrial flutter which persisted through [facility] cardiac monitor.  Patient struggled with medication compliance, however when this improved and she was anticoagulated recommended cardioversion.  Patient underwent successful direct-current cardioversion 01/14/2021.  She presented to our office 01/15/2021 and again to the emergency department 01/16/2021 with worsening dyspnea and swelling.  During her office visit on 9/15 patient had been advised to increase outpatient Lasix. She now presents for follow up. ***  ***  Patient presents today for urgent visit with concerns of face swelling following cardioversion yesterday.  Given patient's wheezing and worsening cough as well as mild bilateral leg edema will obtain BNP.  We will also have patient take Lasix 40 mg twice daily for the next 3 days, then return to taking Lasix 40 mg once daily.  Given her cough worsening over the last 2 weeks will obtain chest x-ray.  Patient's symptoms of stay swelling have resolved, however I am concerned she may have had an allergic reaction.  Given her ongoing cough as well as concern for allergic reaction advised patient to follow-up with PCP urgently, patient verbalized understanding and agreement.  Patient's blood pressure is elevated in the office today, will hold off on making changes until recheck at office visit in 1 week.  Counseled patient regarding signs and symptoms that would warrant urgent or emergent evaluation, particularly review of facial swelling this morning.  Follow-up as previously scheduled in 1 week with repeat EKG following cardioversion.   Azel Gumina  Royston Sinner, PA-C 01/26/2021, 12:04 PM Office: [phone]

## 2021-01-27 ENCOUNTER — Ambulatory Visit: Payer: Medicare HMO | Admitting: Student

## 2021-01-27 DIAGNOSIS — I484 Atypical atrial flutter: Secondary | ICD-10-CM

## 2021-01-27 DIAGNOSIS — I5032 Chronic diastolic (congestive) heart failure: Secondary | ICD-10-CM

## 2021-02-02 ENCOUNTER — Ambulatory Visit: Payer: Medicare HMO | Admitting: Student

## 2021-02-02 ENCOUNTER — Encounter: Payer: Self-pay | Admitting: Student

## 2021-02-02 ENCOUNTER — Other Ambulatory Visit: Payer: Self-pay

## 2021-02-02 VITALS — BP 129/61 | HR 95 | Ht 64.0 in | Wt 165.0 lb

## 2021-02-02 DIAGNOSIS — R0609 Other forms of dyspnea: Secondary | ICD-10-CM

## 2021-02-02 DIAGNOSIS — I480 Paroxysmal atrial fibrillation: Secondary | ICD-10-CM

## 2021-02-02 DIAGNOSIS — I10 Essential (primary) hypertension: Secondary | ICD-10-CM

## 2021-02-02 DIAGNOSIS — I5032 Chronic diastolic (congestive) heart failure: Secondary | ICD-10-CM

## 2021-02-02 DIAGNOSIS — I484 Atypical atrial flutter: Secondary | ICD-10-CM

## 2021-02-02 MED ORDER — DILTIAZEM HCL ER COATED BEADS 180 MG PO CP24: 180.0000 mg | ORAL_CAPSULE | Freq: Every day | ORAL | 3 refills | Status: DC

## 2021-02-02 NOTE — Progress Notes (Signed)
Primary Physician/Referring:  Trey Sailors, PA  Patient ID: [name], female    DOB: 1940/10/08, 80 y.o.   MRN: [id number]  Chief Complaint  Patient presents with   Atrial Fibrillation   Hospitalization Follow-up   HPI:    [name]  is a 80 y.o. 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  Admitted 08/2020 with influenza and found to have new onset atrial fibrillation.  Patient was discharged with Eliquis and diltiazem.  She presented to our office 10/16/2020 for follow-up and EKG revealed slow atypical atrial flutter which persisted through [facility] cardiac monitor.  Patient struggled with medication compliance, however when this improved and she was anticoagulated and recommended cardioversion.  Patient underwent successful direct-current cardioversion 01/14/2021.  She then presented to our office the following day with dyspnea and worsening swelling therefore advised her to increase Lasix for approximately 3 days.  However patient then presented to emergency department 01/16/2021.  At that time BNP elevated at 596 and troponin mildly elevated but flat, consistent with acute on chronic heart failure.  While in the emergency department patient diuresed approximately 1 L and was advised to continue outpatient regimen as directed at her last office visit here.  Patient now presents for follow-up.  Patient's dyspnea and swelling of both significantly improved since last office visit.  She is presently taking Lasix 40 mg daily.  Denies chest pain, palpitations, syncope, near syncope, dizziness.  Past Medical History:  Diagnosis Date   Diabetes mellitus without complication ([place])    Hypertension    Past Surgical History:  Procedure Laterality Date   CARDIOVERSION N/A 01/14/2021    Procedure: CARDIOVERSION;  Surgeon: Adrian Prows, MD;  Location: [place];  Service: Cardiovascular;  Laterality: N/A;   TONSILLECTOMY     History reviewed. No pertinent family history.  No history of premature coronary artery disease or sudden cardiac death in her family. Social History   Tobacco Use   Smoking status: Never   Smokeless tobacco: Never  Substance Use Topics   Alcohol use: No   Marital Status: Divorced  ROS  Review of Systems  Constitutional: Positive for weight loss. Negative for malaise/fatigue and weight gain.  Cardiovascular:  Negative for chest pain, claudication, leg swelling, near-syncope, orthopnea, palpitations, paroxysmal nocturnal dyspnea and syncope.  Respiratory:  Negative for shortness of breath.   Neurological:  Negative for dizziness.  Objective  Blood pressure 129/61, pulse 95, height _0  (1.626 m), weight 165 lb (74.8 kg), SpO2 95 %.  Vitals with BMI 02/02/2021 01/16/2021 01/16/2021  Height _1  - -  Weight 165 lbs - -  BMI 40.34 - -  Systolic 742 595 638  Diastolic 61 81 98  Pulse 95 101 97     Physical Exam Vitals reviewed.  Constitutional:      Appearance: She is obese.     Comments: Mildly obese in no acute distress.  Neck:     Vascular: No carotid bruit or JVD.  Cardiovascular:     Rate and Rhythm: Normal rate and regular rhythm.     Pulses:          Carotid pulses are 2+ on the right side and 2+ on the left side.      Femoral pulses are 2+ on the right side and 2+ on the left side.      Dorsalis  pedis pulses are 2+ on the right side and 1+ on the left side.       Posterior tibial pulses are 2+ on the right side and 0 on the left side.     Heart sounds: Normal heart sounds. No murmur heard.   No gallop.     Comments:   Pulmonary:     Effort: Pulmonary effort is normal.     Breath sounds: No wheezing or rales.  Musculoskeletal:     Right lower leg: No edema.     Left lower leg: No edema.  Skin:    Comments: Hair loss and  atrophic changes bilateral lower legs.  Neurological:     Mental Status: She is alert.   Laboratory examination:   Recent Labs    09/07/20 0437 09/08/20 0437 09/26/20 1413 11/07/20 1415 01/13/21 1143 01/16/21 1755  NA 137 139   < > 139 133* 135  K 3.7 3.2*   < > 4.8 5.0 4.2  CL 102 105   < > 103 98 101  CO2 21* 27   < > _0 GLUCOSE 197* 117*   < > 256* 194* 80  BUN 22 21   < > _1 CREATININE 1.28* 1.30*   < > 1.75* 1.95* 1.87*  CALCIUM 8.7* 8.4*   < > 8.9 8.7 9.3  GFRNONAA 43* 42*  --   --   --  27*   < > = values in this interval not displayed.   estimated creatinine clearance is 24.1 mL/min (A) (by C-G formula based on SCr of 1.87 mg/dL (H)).  CMP Latest Ref Rng & Units 01/16/2021 01/13/2021 11/07/2020  Glucose 70 - 99 mg/dL 80 194(H) 256(H)  BUN 8 - 23 mg/dL _2 Creatinine 0.44 - 1.00 mg/dL 1.87(H) 1.95(H) 1.75(H)  Sodium 135 - 145 mmol/L 135 133(L) 139  Potassium 3.5 - 5.1 mmol/L 4.2 5.0 4.8  Chloride 98 - 111 mmol/L 101 98 103  CO2 22 - 32 mmol/L _3 Calcium 8.9 - 10.3 mg/dL 9.3 8.7 8.9  Total Protein 6.5 - 8.1 g/dL 7.8 - -  Total Bilirubin 0.3 - 1.2 mg/dL 1.0 - -  Alkaline Phos 38 - 126 U/L 81 - -  AST 15 - 41 U/L 36 - -  ALT 0 - 44 U/L 18 - -   CBC Latest Ref Rng & Units 01/16/2021 01/13/2021 09/07/2020  WBC 4.0 - 10.5 K/uL 5.9 4.6 5.0  Hemoglobin 12.0 - 15.0 g/dL 8.9(L) 9.3(L) 10.7(L)  Hematocrit 36.0 - 46.0 % 28.3(L) 28.5(L) 33.3(L)  Platelets 150 - 400 K/uL 165 165 114(L)    Lipid Panel No results for input(s): CHOL, TRIG, LDLCALC, VLDL, HDL, CHOLHDL, LDLDIRECT in the last 8760 hours.  HEMOGLOBIN A1C Lab Results  Component Value Date   HGBA1C 10.4 (H) 09/06/2020   MPG 251.78 09/06/2020   TSH Recent Labs    09/07/20 1255  TSH 1.023    BNP    Component Value Date/Time   BNP 596.2 (H) 01/16/2021 1755    ProBNP No results found for: PROBNP  External labs:  01/16/2021: BNP 596 High-sensitivity troponin 38-->  36  05/15/2020: Serum glucose 184 mg, BUN 26, creatinine 1.60, EGFR 35 mL, sodium 137, potassium 4.3, CMP otherwise normal. A1c 10.0%. BNP 08/10/1975. Total cholesterol 224, triglycerides 46, HDL 113, LDL 103.  Non-HDL cholesterol 111. Labs 10/20/2016: TSH normal.  Allergies   Allergies  Allergen Reactions   Peanut-Containing  Drug Products     Stomach pain   Amlodipine Swelling    Medications Prior to Visit:   Outpatient Medications Prior to Visit  Medication Sig Dispense Refill   Accu-Chek FastClix Lancets MISC See admin instructions.     apixaban (ELIQUIS) 5 MG TABS tablet Take 1 tablet (5 mg total) by mouth 2 (two) times daily. 60 tablet 0   BD PEN NEEDLE NANO 2ND GEN 32G X 4 MM MISC 3 (three) times daily. as directed     benzonatate (TESSALON) 100 MG capsule Take 100 mg by mouth 3 (three) times daily.     Blood Glucose Monitoring Suppl (ACCU-CHEK GUIDE ME) w/Device KIT See admin instructions.     Coenzyme Q10 200 MG capsule Take 200 mg by mouth 3 (three) times a week.     D-Mannose 500 MG CAPS Take 1 capsule by mouth daily.     furosemide (LASIX) 20 MG tablet Take 40 mg by mouth at bedtime.     glimepiride (AMARYL) 2 MG tablet Take 2 mg by mouth daily.     glucose blood test strip TEST BLOOD SUGARS 3 4 TIMES DAILY.     hydrALAZINE (APRESOLINE) 50 MG tablet TAKE 1 TABLET(50 MG) BY MOUTH THREE TIMES DAILY 270 tablet 3   insulin lispro (HUMALOG) 100 UNIT/ML KwikPen Inject 15 Units into the skin at bedtime.     isosorbide dinitrate (ISORDIL) 30 MG tablet Take 30 mg by mouth 3 (three) times daily.     Menthol-Methyl Salicylate (MUSCLE RUB EX) Apply 1 application topically daily as needed (leg cramps).     MISC NATURAL PRODUCTS PO Take 1 capsule by mouth 3 (three) times a week. Hawkeye: HEMP Capsules     MISC NATURAL PRODUCTS PO Take 1 capsule by mouth daily. Ultimate Friendly Flora Probiotic     Multiple Vitamins-Minerals (MULTIVITAMIN ADULTS) TABS Take 1 tablet by mouth daily.      pravastatin (PRAVACHOL) 20 MG tablet Take 1 tablet (20 mg total) by mouth daily. (Patient taking differently: Take 20 mg by mouth at bedtime.) 90 tablet 1   VITAMIN D PO Take 1 capsule by mouth 3 (three) times a week.     diltiazem (CARDIZEM CD) 120 MG 24 hr capsule Take 1 capsule (120 mg total) by mouth daily. 30 capsule 2   No facility-administered medications prior to visit.   Final Medications at End of Visit    Current Meds  Medication Sig   Accu-Chek FastClix Lancets MISC See admin instructions.   apixaban (ELIQUIS) 5 MG TABS tablet Take 1 tablet (5 mg total) by mouth 2 (two) times daily.   BD PEN NEEDLE NANO 2ND GEN 32G X 4 MM MISC 3 (three) times daily. as directed   benzonatate (TESSALON) 100 MG capsule Take 100 mg by mouth 3 (three) times daily.   Blood Glucose Monitoring Suppl (ACCU-CHEK GUIDE ME) w/Device KIT See admin instructions.   Coenzyme Q10 200 MG capsule Take 200 mg by mouth 3 (three) times a week.   D-Mannose 500 MG CAPS Take 1 capsule by mouth daily.   furosemide (LASIX) 20 MG tablet Take 40 mg by mouth at bedtime.   glimepiride (AMARYL) 2 MG tablet Take 2 mg by mouth daily.   glucose blood test strip TEST BLOOD SUGARS 3 4 TIMES DAILY.   hydrALAZINE (APRESOLINE) 50 MG tablet TAKE 1 TABLET(50 MG) BY MOUTH THREE TIMES DAILY   insulin lispro (HUMALOG) 100 UNIT/ML KwikPen Inject 15 Units into the skin at bedtime.  isosorbide dinitrate (ISORDIL) 30 MG tablet Take 30 mg by mouth 3 (three) times daily.   Menthol-Methyl Salicylate (MUSCLE RUB EX) Apply 1 application topically daily as needed (leg cramps).   MISC NATURAL PRODUCTS PO Take 1 capsule by mouth 3 (three) times a week. Hawkeye: HEMP Capsules   MISC NATURAL PRODUCTS PO Take 1 capsule by mouth daily. Ultimate Friendly Flora Probiotic   Multiple Vitamins-Minerals (MULTIVITAMIN ADULTS) TABS Take 1 tablet by mouth daily.   pravastatin (PRAVACHOL) 20 MG tablet Take 1 tablet (20 mg total) by mouth daily. (Patient taking  differently: Take 20 mg by mouth at bedtime.)   VITAMIN D PO Take 1 capsule by mouth 3 (three) times a week.   [DISCONTINUED] diltiazem (CARDIZEM CD) 120 MG 24 hr capsule Take 1 capsule (120 mg total) by mouth daily.   Radiology:   Chest x-ray single view 04/25/2019: Mediastinum hilar structures normal. Cardiomegaly. Mild pulmonary venous congestion bilateral interstitial prominence. Findings suggest mild CHF.  Calcified pulmonary nodules noted consistent with old calcified granulomas. Small right pleural effusion cannot be Excluded.  Cardiac Studies:   ABI 06/19/2020:  This exam reveals normal perfusion of the right and left lower extremity  (ABI 1.00). Mildly abnormal biphasic waveform noted at the ankles.  There  is mild plaque evident in the lower extremity vessels at the ankles.  PCV ECHOCARDIOGRAM COMPLETE 06/19/2020 Left ventricle cavity is normal in size and wall thickness. Normal global wall motion. Normal LV systolic function with EF 55%. Doppler evidence of grade II (pseudonormal) diastolic dysfunction, elevated LAP. Calculated EF 55%. Left atrial cavity is mildly dilated. Right atrial cavity is moderately dilated. Right ventricle cavity is moderately dilated. Moderately reduced right ventricular function. Trileaflet aortic valve with mild aortic valve leaflet calcification. Trace aortic stenosis. Mild (Grade I) aortic regurgitation. Moderate (Grade III) mitral regurgitation. Severe tricuspid regurgitation. Moderate pulmonary hypertension. Estimated pulmonary artery systolic pressure 52 mmHg.   PCV MYOCARDIAL PERFUSION WITH LEXISCAN 06/11/2020 Nondiagnostic ECG stress. Perfusion imaging study demonstrates decreased uptake in the inferior wall suggestive of soft tissue attenuation.  Ischemia in this region cannot be completely excluded. Overall LV systolic function is normal without regional wall motion abnormalities. Stress LV EF: 82%. Low risk.  Ambulatory cardiac  telemetry 10/16/20-6/30-22: Patient in atypical atrial flutter 100% of the time. Heart rate 53-130 bpm with average 87 bpm. No symptoms reported. Rare PVCs. Dr. Einar Gip assisted in interpretation of findings.  EKG:  02/02/2021: Sinus rhythm with first-degree AV block at a rate of 96 bpm.  Right bundle branch block.  12/30/2020: Atypical atrial flutter at a rate of 104 bpm.  Left axis.  right bundle branch block.  10/16/2020: Slow atypical atrial flutter at a rate of 85 bpm.  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  09/07/2020: Atrial fibrillation, 104 bpm, right bundle branch block, left anterior fascicular block, poor R wave progression, without underlying injury pattern.  EKG 03/19/2020: Sinus rhythm with first-degree AV block at rate of 80 bpm, left axis deviation, left anterior fascicular block.  Right bundle branch block.  Trifascicular block.  Assessment     ICD-10-CM   1. Atypical atrial flutter (HCC)  I48.4 EKG 12-Lead    2. Paroxysmal atrial fibrillation (HCC)  I48.0 diltiazem (CARDIZEM CD) 180 MG 24 hr capsule    3. Chronic diastolic heart failure (HCC)  M19.62 Basic metabolic panel    Brain natriuretic peptide    4. Essential hypertension  I10  5. Dyspnea on exertion  R06.09        Medications Discontinued During This Encounter  Medication Reason   diltiazem (CARDIZEM CD) 120 MG 24 hr capsule      Meds ordered this encounter  Medications   diltiazem (CARDIZEM CD) 180 MG 24 hr capsule    Sig: Take 1 capsule (180 mg total) by mouth daily.    Dispense:  30 capsule    Refill:  3    This patients CHA2DS2-VASc Score 5 (HTN, DM, age, F) and yearly risk of stroke 7.2%.   Recommendations:   Deshannon Seide is a 80 y.o. African-American female with history of hypertension, uncontrolled diabetes mellitus with stage IIIb chronic kidney disease, hyperlipidemia, who was originally referred for evaluation of leg edema, pulmonary  hypertension and dyspnea on exertion.  Admitted 08/2020 with influenza and found to have new onset atrial fibrillation.  Patient was discharged with Eliquis and diltiazem.  She presented to our office 10/16/2020 for follow-up and EKG revealed slow atypical atrial flutter which persisted through [facility] cardiac monitor.  Patient struggled with medication compliance, however when this improved and she was anticoagulated recommended cardioversion.  Patient underwent successful direct-current cardioversion 01/14/2021.  She presented to our office 01/15/2021 and again to the emergency department 01/16/2021 with worsening dyspnea and swelling.  During her office visit on 9/15 patient had been advised to increase outpatient Lasix. She now presents for follow up.  Patient's symptoms of dyspnea and swelling have significantly improved.  She appears euvolemic on exam with no evidence of acute decompensated heart failure.  Advised her to continue Lasix 40 mg once daily.  We will repeat BNP and BMP at this time.  She is presently maintaining sinus rhythm, however her heart rate is elevated at today's office visit, will increase diltiazem from 120 mg to 180 mg daily.  Patient is otherwise stable from a cardiovascular standpoint.  Blood pressure is well controlled.  Follow-up in 3 months, sooner if needed, for HFpEF, atrial fibrillation/flutter, hypertension.   Alethia Berthold, PA-C 02/02/2021, 11:50 AM Office: [phone]

## 2021-02-02 NOTE — Patient Instructions (Addendum)
Take Lasix 40 mg once daily at night Increase diltiazem from 120 mg to 180 mg daily, new prescription sent to [facility] labs done at [employer] [facility] in the next 1-2 weeks

## 2021-02-04 LAB — BRAIN NATRIURETIC PEPTIDE: BNP: 640.9 pg/mL — ABNORMAL HIGH (ref 0.0–100.0)

## 2021-02-04 LAB — BASIC METABOLIC PANEL
BUN/Creatinine Ratio: 10 — ABNORMAL LOW (ref 12–28)
BUN: 15 mg/dL (ref 8–27)
CO2: 24 mmol/L (ref 20–29)
Calcium: 9.8 mg/dL (ref 8.7–10.3)
Chloride: 99 mmol/L (ref 96–106)
Creatinine, Ser: 1.54 mg/dL — ABNORMAL HIGH (ref 0.57–1.00)
Glucose: 143 mg/dL — ABNORMAL HIGH (ref 70–99)
Potassium: 4.4 mmol/L (ref 3.5–5.2)
Sodium: 139 mmol/L (ref 134–144)
eGFR: 34 mL/min/{1.73_m2} — ABNORMAL LOW (ref >59)

## 2021-02-10 NOTE — Progress Notes (Signed)
Called patient, Na, LMAM.

## 2021-02-12 ENCOUNTER — Telehealth: Payer: Self-pay | Admitting: Student

## 2021-02-12 NOTE — Progress Notes (Signed)
Patient called back, I have discussed results with her, and she is agreeable to make changes accordingly.

## 2021-02-12 NOTE — Progress Notes (Signed)
2nd attempt : Called patient, NA, LMAM

## 2021-02-15 ENCOUNTER — Other Ambulatory Visit: Payer: Self-pay | Admitting: Cardiology

## 2021-03-02 ENCOUNTER — Encounter: Payer: Self-pay | Admitting: Pharmacist

## 2021-03-02 NOTE — Progress Notes (Signed)
CARE PLAN ENTRY  03/02/2021 Name: [name] MRN: [id number] DOB: 1940/10/05  [name] is enrolled in Remote Patient Monitoring/Principle Care Monitoring.  Date of Enrollment: 08/22/20 Supervising physician: Adrian Prows Indication: HTN  Remote Readings: Compliant and Avg BP: 156/82, HR:82  Next scheduled OV: 05/11/21  Pharmacist Clinical Goal(s):  Over the next 90 days, patient will demonstrate Improved medication adherence as evidenced by medication fill history Over the next 90 days, patient will demonstrate improved understanding of prescribed medications and rationale for usage as evidenced by patient teach back Over the next 90 days, patient will experience decrease in ED visits. ED visits in last 6 months = 1 Over the next 90 days, patient will not experience hospital admission. Hospital Admissions in last 6 months = 0  Interventions: Provider and Inter-disciplinary care team collaboration (see longitudinal plan of care) Comprehensive medication review performed. Discussed plans with patient for ongoing care management follow up and provided patient with direct contact information for care management team Collaboration with provider re: medication management  Patient Self Care Activities:  Self administers medications as prescribed Attends all scheduled provider appointments Performs ADL's independently Performs IADL's independently  Allergies  Allergen Reactions   Peanut-Containing Drug Products     Stomach pain   Amlodipine Swelling   Outpatient Encounter Medications as of 03/02/2021  Medication Sig   Accu-Chek FastClix Lancets MISC See admin instructions.   apixaban (ELIQUIS) 5 MG TABS tablet Take 1 tablet (5 mg total) by mouth 2 (two) times daily.   BD PEN NEEDLE NANO 2ND GEN 32G X 4 MM MISC 3 (three) times daily. as directed   benzonatate (TESSALON) 100 MG capsule Take 100 mg by mouth 3 (three) times daily.   Blood Glucose Monitoring Suppl (ACCU-CHEK GUIDE  ME) w/Device KIT See admin instructions.   Coenzyme Q10 200 MG capsule Take 200 mg by mouth 3 (three) times a week.   D-Mannose 500 MG CAPS Take 1 capsule by mouth daily.   diltiazem (CARDIZEM CD) 180 MG 24 hr capsule Take 1 capsule (180 mg total) by mouth daily.   furosemide (LASIX) 20 MG tablet Take 40 mg by mouth at bedtime.   glimepiride (AMARYL) 2 MG tablet Take 2 mg by mouth daily.   glucose blood test strip TEST BLOOD SUGARS 3 4 TIMES DAILY.   hydrALAZINE (APRESOLINE) 50 MG tablet TAKE 1 TABLET(50 MG) BY MOUTH THREE TIMES DAILY   insulin lispro (HUMALOG) 100 UNIT/ML KwikPen Inject 15 Units into the skin at bedtime.   isosorbide dinitrate (ISORDIL) 30 MG tablet Take 30 mg by mouth 3 (three) times daily.   Menthol-Methyl Salicylate (MUSCLE RUB EX) Apply 1 application topically daily as needed (leg cramps).   MISC NATURAL PRODUCTS PO Take 1 capsule by mouth 3 (three) times a week. Hawkeye: HEMP Capsules   MISC NATURAL PRODUCTS PO Take 1 capsule by mouth daily. Ultimate Friendly Flora Probiotic   Multiple Vitamins-Minerals (MULTIVITAMIN ADULTS) TABS Take 1 tablet by mouth daily.   pravastatin (PRAVACHOL) 20 MG tablet Take 1 tablet (20 mg total) by mouth daily. (Patient taking differently: Take 20 mg by mouth at bedtime.)   VITAMIN D PO Take 1 capsule by mouth 3 (three) times a week.   No facility-administered encounter medications on file as of 03/02/2021.    Hypertension   BP goal is:  <130/80  Office blood pressures are  BP Readings from Last 3 Encounters:  02/02/21 129/61  01/16/21 (!) 142/81  01/15/21 (!) 158/76    Patient  is currently uncontrolled on the following medications: Diltiazem 180 mg, isordil 30 mg TID, lasix 40 mg, hydralazine 50 mg TID,   Patient checks BP at home daily  Patient home BP readings are ranging: 137-175/69-105  We discussed diet and exercise extensively  Plan  Continue current medications   ______________ Visit Information SDOH (Social  Determinants of Health) assessments performed: Yes.  [name] was given information about Principle Care Management/Remote Patient Monitoring services today including:  RPM/PCM service includes personalized support from designated clinical staff supervised by her physician, including individualized plan of care and coordination with other care providers 24/7 contact phone numbers for assistance for urgent and routine care needs. Standard insurance, coinsurance, copays and deductibles apply for principle care management only during months in which we provide at least 30 minutes of these services. Most insurances cover these services at 100%, however patients may be responsible for any copay, coinsurance and/or deductible if applicable. This service may help you avoid the need for more expensive face-to-face services. Only one practitioner may furnish and bill the service in a calendar month. The patient may stop PCM/RPM services at any time (effective at the end of the month) by phone call to the office staff.  Patient agreed to services and verbal consent obtained.   Manuela Schwartz, Pharm.D. [facility] [phone] Ext: 120

## 2021-04-10 ENCOUNTER — Other Ambulatory Visit: Payer: Self-pay | Admitting: Cardiology

## 2021-04-15 ENCOUNTER — Other Ambulatory Visit: Payer: Self-pay | Admitting: Student

## 2021-04-16 ENCOUNTER — Other Ambulatory Visit: Payer: Self-pay | Admitting: Cardiovascular Disease

## 2021-04-24 ENCOUNTER — Encounter (HOSPITAL_COMMUNITY): Payer: Self-pay | Admitting: Emergency Medicine

## 2021-04-24 ENCOUNTER — Emergency Department (HOSPITAL_COMMUNITY): Payer: Medicare HMO

## 2021-04-24 ENCOUNTER — Other Ambulatory Visit: Payer: Self-pay

## 2021-04-24 ENCOUNTER — Inpatient Hospital Stay (HOSPITAL_COMMUNITY)
Admission: EM | Admit: 2021-04-24 | Discharge: 2021-05-02 | DRG: 871 | Disposition: A | Discharge status: Home / Self Care | Payer: Medicare HMO | Attending: Internal Medicine | Admitting: Internal Medicine

## 2021-04-24 DIAGNOSIS — N189 Chronic kidney disease, unspecified: Secondary | ICD-10-CM

## 2021-04-24 DIAGNOSIS — Z6831 Body mass index (BMI) 31.0-31.9, adult: Secondary | ICD-10-CM

## 2021-04-24 DIAGNOSIS — R410 Disorientation, unspecified: Secondary | ICD-10-CM

## 2021-04-24 DIAGNOSIS — R188 Other ascites: Secondary | ICD-10-CM | POA: Diagnosis present

## 2021-04-24 DIAGNOSIS — Z794 Long term (current) use of insulin: Secondary | ICD-10-CM

## 2021-04-24 DIAGNOSIS — E1122 Type 2 diabetes mellitus with diabetic chronic kidney disease: Secondary | ICD-10-CM | POA: Diagnosis present

## 2021-04-24 DIAGNOSIS — I10 Essential (primary) hypertension: Secondary | ICD-10-CM | POA: Diagnosis not present

## 2021-04-24 DIAGNOSIS — N170 Acute kidney failure with tubular necrosis: Secondary | ICD-10-CM | POA: Diagnosis present

## 2021-04-24 DIAGNOSIS — A419 Sepsis, unspecified organism: Secondary | ICD-10-CM

## 2021-04-24 DIAGNOSIS — Z7901 Long term (current) use of anticoagulants: Secondary | ICD-10-CM

## 2021-04-24 DIAGNOSIS — E1129 Type 2 diabetes mellitus with other diabetic kidney complication: Secondary | ICD-10-CM | POA: Diagnosis present

## 2021-04-24 DIAGNOSIS — R7989 Other specified abnormal findings of blood chemistry: Secondary | ICD-10-CM

## 2021-04-24 DIAGNOSIS — R061 Stridor: Secondary | ICD-10-CM | POA: Diagnosis present

## 2021-04-24 DIAGNOSIS — N1832 Chronic kidney disease, stage 3b: Secondary | ICD-10-CM | POA: Diagnosis present

## 2021-04-24 DIAGNOSIS — N3000 Acute cystitis without hematuria: Secondary | ICD-10-CM

## 2021-04-24 DIAGNOSIS — Z79899 Other long term (current) drug therapy: Secondary | ICD-10-CM

## 2021-04-24 DIAGNOSIS — A4151 Sepsis due to Escherichia coli [E. coli]: Principal | ICD-10-CM | POA: Diagnosis present

## 2021-04-24 DIAGNOSIS — G928 Other toxic encephalopathy: Secondary | ICD-10-CM | POA: Diagnosis present

## 2021-04-24 DIAGNOSIS — D631 Anemia in chronic kidney disease: Secondary | ICD-10-CM | POA: Diagnosis present

## 2021-04-24 DIAGNOSIS — R7881 Bacteremia: Secondary | ICD-10-CM | POA: Diagnosis present

## 2021-04-24 DIAGNOSIS — N3001 Acute cystitis with hematuria: Secondary | ICD-10-CM | POA: Diagnosis present

## 2021-04-24 DIAGNOSIS — J90 Pleural effusion, not elsewhere classified: Secondary | ICD-10-CM | POA: Diagnosis present

## 2021-04-24 DIAGNOSIS — E440 Moderate protein-calorie malnutrition: Secondary | ICD-10-CM | POA: Diagnosis present

## 2021-04-24 DIAGNOSIS — R062 Wheezing: Secondary | ICD-10-CM

## 2021-04-24 DIAGNOSIS — D696 Thrombocytopenia, unspecified: Secondary | ICD-10-CM | POA: Diagnosis present

## 2021-04-24 DIAGNOSIS — R1011 Right upper quadrant pain: Secondary | ICD-10-CM

## 2021-04-24 DIAGNOSIS — N179 Acute kidney failure, unspecified: Secondary | ICD-10-CM | POA: Diagnosis not present

## 2021-04-24 DIAGNOSIS — I129 Hypertensive chronic kidney disease with stage 1 through stage 4 chronic kidney disease, or unspecified chronic kidney disease: Secondary | ICD-10-CM | POA: Diagnosis present

## 2021-04-24 DIAGNOSIS — I4819 Other persistent atrial fibrillation: Secondary | ICD-10-CM | POA: Diagnosis present

## 2021-04-24 DIAGNOSIS — Z20822 Contact with and (suspected) exposure to covid-19: Secondary | ICD-10-CM | POA: Diagnosis present

## 2021-04-24 DIAGNOSIS — R6521 Severe sepsis with septic shock: Secondary | ICD-10-CM | POA: Diagnosis present

## 2021-04-24 DIAGNOSIS — R6520 Severe sepsis without septic shock: Secondary | ICD-10-CM | POA: Diagnosis not present

## 2021-04-24 DIAGNOSIS — E1165 Type 2 diabetes mellitus with hyperglycemia: Secondary | ICD-10-CM | POA: Diagnosis present

## 2021-04-24 LAB — COMPREHENSIVE METABOLIC PANEL
ALT: 17 U/L (ref 0–44)
AST: 29 U/L (ref 15–41)
Albumin: 3.8 g/dL (ref 3.5–5.0)
Alkaline Phosphatase: 114 U/L (ref 38–126)
Anion gap: 11 (ref 5–15)
BUN: 33 mg/dL — ABNORMAL HIGH (ref 8–23)
CO2: 24 mmol/L (ref 22–32)
Calcium: 9.2 mg/dL (ref 8.9–10.3)
Chloride: 103 mmol/L (ref 98–111)
Creatinine, Ser: 2.44 mg/dL — ABNORMAL HIGH (ref 0.44–1.00)
GFR, Estimated: 20 mL/min — ABNORMAL LOW (ref >60)
Glucose, Bld: 406 mg/dL — ABNORMAL HIGH (ref 70–99)
Potassium: 3.7 mmol/L (ref 3.5–5.1)
Sodium: 138 mmol/L (ref 135–145)
Total Bilirubin: 1 mg/dL (ref 0.3–1.2)
Total Protein: 7.9 g/dL (ref 6.5–8.1)

## 2021-04-24 LAB — URINALYSIS, ROUTINE W REFLEX MICROSCOPIC
Bilirubin Urine: NEGATIVE
Glucose, UA: NEGATIVE mg/dL
Ketones, ur: NEGATIVE mg/dL
Nitrite: POSITIVE — AB
Protein, ur: >300 mg/dL — AB
RBC / HPF: >50 RBC/hpf — ABNORMAL HIGH (ref 0–5)
Specific Gravity, Urine: 1.02 (ref 1.005–1.030)
WBC, UA: >50 WBC/hpf — ABNORMAL HIGH (ref 0–5)
pH: 6 (ref 5.0–8.0)

## 2021-04-24 LAB — CBC WITH DIFFERENTIAL/PLATELET
Abs Immature Granulocytes: 0.11 10*3/uL — ABNORMAL HIGH (ref 0.00–0.07)
Basophils Absolute: 0 10*3/uL (ref 0.0–0.1)
Basophils Relative: 0 %
Eosinophils Absolute: 0 10*3/uL (ref 0.0–0.5)
Eosinophils Relative: 0 %
HCT: 33.9 % — ABNORMAL LOW (ref 36.0–46.0)
Hemoglobin: 10.8 g/dL — ABNORMAL LOW (ref 12.0–15.0)
Immature Granulocytes: 1 %
Lymphocytes Relative: 3 %
Lymphs Abs: 0.3 10*3/uL — ABNORMAL LOW (ref 0.7–4.0)
MCH: 29.3 pg (ref 26.0–34.0)
MCHC: 31.9 g/dL (ref 30.0–36.0)
MCV: 91.9 fL (ref 80.0–100.0)
Monocytes Absolute: 0.4 10*3/uL (ref 0.1–1.0)
Monocytes Relative: 5 %
Neutro Abs: 8.1 10*3/uL — ABNORMAL HIGH (ref 1.7–7.7)
Neutrophils Relative %: 91 %
Platelets: 106 10*3/uL — ABNORMAL LOW (ref 150–400)
RBC: 3.69 MIL/uL — ABNORMAL LOW (ref 3.87–5.11)
RDW: 13.9 % (ref 11.5–15.5)
WBC: 9 10*3/uL (ref 4.0–10.5)
nRBC: 0 % (ref 0.0–0.2)

## 2021-04-24 LAB — PROTIME-INR
INR: 1.3 — ABNORMAL HIGH (ref 0.8–1.2)
Prothrombin Time: 15.9 seconds — ABNORMAL HIGH (ref 11.4–15.2)

## 2021-04-24 LAB — RESP PANEL BY RT-PCR (FLU A&B, COVID) ARPGX2
Influenza A by PCR: NEGATIVE
Influenza B by PCR: NEGATIVE
SARS Coronavirus 2 by RT PCR: NEGATIVE

## 2021-04-24 LAB — APTT: aPTT: 32 seconds (ref 24–36)

## 2021-04-24 LAB — LACTIC ACID, PLASMA
Lactic Acid, Venous: 3.1 mmol/L (ref 0.5–1.9)
Lactic Acid, Venous: 3.9 mmol/L (ref 0.5–1.9)

## 2021-04-24 LAB — CBG MONITORING, ED: Glucose-Capillary: 350 mg/dL — ABNORMAL HIGH (ref 70–99)

## 2021-04-24 MED ORDER — SODIUM CHLORIDE 0.9 % IV SOLN
2.0000 g | INTRAVENOUS | Status: DC
  Administered 2021-04-24: 21:00:00 2 g via INTRAVENOUS
  Filled 2021-04-24: qty 20

## 2021-04-24 MED ORDER — LACTATED RINGERS IV BOLUS (SEPSIS)
250.0000 mL | Freq: Once | INTRAVENOUS | Status: AC
  Administered 2021-04-24: 21:00:00 250 mL via INTRAVENOUS

## 2021-04-24 MED ORDER — LACTATED RINGERS IV SOLN: INTRAVENOUS | Status: DC

## 2021-04-24 MED ORDER — LACTATED RINGERS IV BOLUS (SEPSIS)
1000.0000 mL | Freq: Once | INTRAVENOUS | Status: AC
  Administered 2021-04-24: 21:00:00 1000 mL via INTRAVENOUS

## 2021-04-24 MED ORDER — LACTATED RINGERS IV BOLUS
2000.0000 mL | Freq: Once | INTRAVENOUS | Status: AC
  Administered 2021-04-24: 18:00:00 2000 mL via INTRAVENOUS

## 2021-04-24 NOTE — Assessment & Plan Note (Signed)
SSI. Hold amaryl.

## 2021-04-24 NOTE — Assessment & Plan Note (Signed)
Continue with gentle IVF due to 4.25 liters of IVF resuscitation. Repeat BMP in AM. Hold all nephrotoxic drugs. Pt's baseline Scr 1.7

## 2021-04-24 NOTE — ED Provider Notes (Signed)
[facility] DEPT Provider Note   CSN: [id number] Arrival date & time: 04/24/21  1740     History Chief Complaint  Patient presents with   Weakness   Hyperglycemia    [name] is a 79 y.o. female.  HPI She is coming from home to be evaluated for altered mental status, and inability to care for self.  She was found by EMS, covered in feces, with a very high fever.  She was treated with oral Tylenol during transport.  On initial evaluation by me, no one is with her.  She is unable to give any history.    Level 5 caveat-altered mental status    Past Medical History:  Diagnosis Date   Diabetes mellitus without complication ([place])    Hypertension     Patient Active Problem List   Diagnosis Date Noted   Influenza A 09/06/2020   Persistent atrial fibrillation ([place]) 09/06/2020   DM (diabetes mellitus), type 2 with renal complications ([place]) 78/46/9629   Prolonged QT interval 09/06/2020   Abnormal gait 06/18/2020   Heart failure ([place]) 06/18/2020   Hyperglycemia due to type 2 diabetes mellitus ([place]) 06/18/2020   Hypertensive heart disease without congestive heart failure 06/18/2020   Mixed incontinence 06/18/2020   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/18/2020   Pure hypercholesterolemia 06/18/2020   Stage 3 chronic kidney disease ([place]) 06/18/2020   Type 2 diabetes mellitus without complications ([place]) 52/84/1324   Vitamin D deficiency 06/18/2020   Essential hypertension 05/08/2019   Hyperlipidemia 05/08/2019   Bilateral lower extremity edema 05/08/2019   Failed hearing screening 02/02/2018   Impacted cerumen of right ear 02/02/2018   Otalgia, right 02/02/2018    Past Surgical History:  Procedure Laterality Date   CARDIOVERSION N/A 01/14/2021   Procedure: CARDIOVERSION;  Surgeon: Adrian Prows, MD;  Location: [facility] ENDOSCOPY;  Service: Cardiovascular;  Laterality: N/A;    TONSILLECTOMY       OB History   No obstetric history on file.     History reviewed. No pertinent family history.  Social History   Tobacco Use   Smoking status: Never   Smokeless tobacco: Never  Vaping Use   Vaping Use: Never used  Substance Use Topics   Alcohol use: No   Drug use: No    Home Medications Prior to Admission medications   Medication Sig Start Date End Date Taking? Authorizing Provider  Accu-Chek FastClix Lancets MISC See admin instructions. 01/16/19   [provider]  apixaban (ELIQUIS) 5 MG TABS tablet Take 1 tablet (5 mg total) by mouth 2 (two) times daily. 09/08/20   Jennye Boroughs, MD  BD PEN NEEDLE NANO 2ND GEN 32G X 4 MM MISC 3 (three) times daily. as directed 08/13/19   [provider]  benzonatate (TESSALON) 100 MG capsule Take 100 mg by mouth 3 (three) times daily. 01/20/21   [provider]  Blood Glucose Monitoring Suppl (ACCU-CHEK GUIDE ME) w/Device KIT See admin instructions. 12/11/18   [provider]  Coenzyme Q10 200 MG capsule Take 200 mg by mouth 3 (three) times a week.    [provider]  D-Mannose 500 MG CAPS Take 1 capsule by mouth daily.    [provider]  diltiazem (CARDIZEM CD) 180 MG 24 hr capsule Take 1 capsule (180 mg total) by mouth daily. 02/02/21   Cantwell, Celeste C, PA-C  furosemide (LASIX) 20 MG tablet Take 40 mg by mouth at bedtime.  [provider]  glimepiride (AMARYL) 2 MG tablet Take 2 mg by mouth daily. 01/02/21   [provider]  glucose blood test strip TEST BLOOD SUGARS 3 4 TIMES DAILY. 10/27/17   [provider]  hydrALAZINE (APRESOLINE) 50 MG tablet TAKE 1 TABLET(50 MG) BY MOUTH THREE TIMES DAILY 11/14/20   Cantwell, Celeste C, PA-C  insulin lispro (HUMALOG) 100 UNIT/ML KwikPen Inject 15 Units into the skin at bedtime. 08/14/19   [provider]  isosorbide dinitrate (ISORDIL) 30 MG tablet TAKE 1 TABLET(30 MG) BY MOUTH THREE TIMES DAILY  04/15/21   Cantwell, Celeste C, PA-C  Menthol-Methyl Salicylate (MUSCLE RUB EX) Apply 1 application topically daily as needed (leg cramps).    [provider]  MISC NATURAL PRODUCTS PO Take 1 capsule by mouth 3 (three) times a week. [place]: HEMP Capsules    [provider]  MISC NATURAL PRODUCTS PO Take 1 capsule by mouth daily. Ultimate Friendly Flora Probiotic    [provider]  Multiple Vitamins-Minerals (MULTIVITAMIN ADULTS) TABS Take 1 tablet by mouth daily.    [provider]  pravastatin (PRAVACHOL) 20 MG tablet Take 1 tablet (20 mg total) by mouth daily. Patient taking differently: Take 20 mg by mouth at bedtime. 12/16/20   Cantwell, Celeste C, PA-C  VITAMIN D PO Take 1 capsule by mouth 3 (three) times a week.    [provider]    Allergies    Peanut-containing drug products and Amlodipine  Review of Systems   Review of Systems  Unable to perform ROS: Mental status change   Physical Exam Updated Vital Signs BP 99/60 (BP Location: Right Wrist)    Pulse 95    Temp 98.8 F (37.1 C) (Oral)    Resp (!) 22    Ht _0  (1.626 m)    Wt 74.8 kg    SpO2 100%    BMI 28.32 kg/m   Physical Exam Vitals and nursing note reviewed.  Constitutional:      General: She is not in acute distress.    Appearance: She is well-developed. She is not ill-appearing or toxic-appearing.  HENT:     Head: Normocephalic and atraumatic.     Right Ear: External ear normal.     Left Ear: External ear normal.  Eyes:     Conjunctiva/sclera: Conjunctivae normal.     Pupils: Pupils are equal, round, and reactive to light.  Neck:     Trachea: Phonation normal.  Cardiovascular:     Rate and Rhythm: Tachycardia present.  Pulmonary:     Effort: Pulmonary effort is normal.  Abdominal:     General: Abdomen is flat. There is no distension.     Palpations: Abdomen is soft. There is no mass.     Tenderness: There is no abdominal tenderness.     Hernia: No hernia is  present.  Musculoskeletal:        General: No swelling or tenderness. Normal range of motion.     Cervical back: Normal range of motion and neck supple.     Right lower leg: No edema.     Left lower leg: No edema.  Skin:    General: Skin is warm and dry.     Coloration: Skin is not jaundiced or pale.  Neurological:     Mental Status: She is alert.     Cranial Nerves: No cranial nerve deficit.     Motor: No abnormal muscle tone.     Coordination: Coordination normal.  Comments: She is responsive, but confused.  She speaks softly.  Psychiatric:        Behavior: Behavior normal.    ED Results / Procedures / Treatments   Labs (all labs ordered are listed, but only abnormal results are displayed) Labs Reviewed  LACTIC ACID, PLASMA - Abnormal; Notable for the following components:      Result Value   Lactic Acid, Venous 3.1 (*)    All other components within normal limits  COMPREHENSIVE METABOLIC PANEL - Abnormal; Notable for the following components:   Glucose, Bld 406 (*)    BUN 33 (*)    Creatinine, Ser 2.44 (*)    GFR, Estimated 20 (*)    All other components within normal limits  CBC WITH DIFFERENTIAL/PLATELET - Abnormal; Notable for the following components:   RBC 3.69 (*)    Hemoglobin 10.8 (*)    HCT 33.9 (*)    Platelets 106 (*)    Neutro Abs 8.1 (*)    Lymphs Abs 0.3 (*)    Abs Immature Granulocytes 0.11 (*)    All other components within normal limits  PROTIME-INR - Abnormal; Notable for the following components:   Prothrombin Time 15.9 (*)    INR 1.3 (*)    All other components within normal limits  URINALYSIS, ROUTINE W REFLEX MICROSCOPIC - Abnormal; Notable for the following components:   APPearance CLOUDY (*)    Hgb urine dipstick LARGE (*)    Protein, ur >300 (*)    Nitrite POSITIVE (*)    Leukocytes,Ua MODERATE (*)    RBC / HPF >50 (*)    WBC, UA >50 (*)    Bacteria, UA RARE (*)    Non Squamous Epithelial 0-5 (*)    All other components within  normal limits  CBG MONITORING, ED - Abnormal; Notable for the following components:   Glucose-Capillary 350 (*)    All other components within normal limits  RESP PANEL BY RT-PCR (FLU A&B, COVID) ARPGX2  CULTURE, BLOOD (ROUTINE X 2)  CULTURE, BLOOD (ROUTINE X 2)  URINE CULTURE  APTT  LACTIC ACID, PLASMA    EKG EKG Interpretation  Date/Time:  Friday April 24 2021 18:07:15 EST Ventricular Rate:  110 PR Interval:  186 QRS Duration: 115 QT Interval:  342 QTC Calculation: 463 R Axis:   -89 Text Interpretation: Sinus tachycardia Right bundle branch block Since last tracing Non-specific abnormality, ST segment, and/or T-wave , and rate is faster. Otherwise no significant change Confirmed by Daleen Bo [phone]) on 04/24/2021 6:16:02 PM  Radiology DG Chest Port 1 View  Result Date: 04/24/2021 CLINICAL DATA:  Possible sepsis EXAM: PORTABLE CHEST 1 VIEW COMPARISON:  09/06/2020 FINDINGS: Cardiac shadow is mildly prominent but stable. Calcified granulomas are noted bilaterally. No focal infiltrate or effusion is seen. No bony abnormality is noted. IMPRESSION: No active disease. Electronically Signed   By: Inez Catalina M.D.   On: 04/24/2021 19:00    Procedures .Critical Care Performed by: Daleen Bo, MD Authorized by: Daleen Bo, MD   Critical care provider statement:    Critical care time (minutes):  35   Critical care start time:  04/24/2021 6:10 PM   Critical care end time:  04/24/2021 9:20 PM   Critical care time was exclusive of:  Separately billable procedures and treating other patients   Critical care was necessary to treat or prevent imminent or life-threatening deterioration of the following conditions:  Sepsis   Critical care was time spent personally by me on  the following activities:  Blood draw for specimens, development of treatment plan with patient or surrogate, discussions with consultants, evaluation of patient's response to treatment, examination of  patient, ordering and performing treatments and interventions, ordering and review of laboratory studies, ordering and review of radiographic studies, pulse oximetry, re-evaluation of patient's condition and review of old charts   Medications Ordered in ED Medications  lactated ringers infusion (has no administration in time range)  cefTRIAXone (ROCEPHIN) 2 g in sodium chloride 0.9 % 100 mL IVPB (2 g Intravenous New Bag/Given 04/24/21 2033)  lactated ringers bolus 2,000 mL (0 mLs Intravenous Stopped 04/24/21 2012)  lactated ringers bolus 1,000 mL (1,000 mLs Intravenous New Bag/Given 04/24/21 2034)    And  lactated ringers bolus 1,000 mL (1,000 mLs Intravenous New Bag/Given 04/24/21 2034)    And  lactated ringers bolus 250 mL (250 mLs Intravenous New Bag/Given 04/24/21 2034)    ED Course  I have reviewed the triage vital signs and the nursing notes.  Pertinent labs & imaging results that were available during my care of the patient were reviewed by me and considered in my medical decision making (see chart for details).    MDM Rules/Calculators/A&P                          Patient Vitals for the past 24 hrs:  BP Temp Temp src Pulse Resp SpO2 Height Weight  04/24/21 2051 99/60 98.8 F (37.1 C) Oral 95 (!) 22 100 % -- --  04/24/21 2014 98/67 -- -- 96 20 100 % -- --  04/24/21 1957 -- -- -- 96 16 100 % -- --  04/24/21 1845 (!) 135/114 -- -- (!) 104 16 100 % -- --  04/24/21 1830 (!) 150/59 -- -- (!) 103 (!) 26 100 % -- --  04/24/21 1815 (!) 159/61 -- -- (!) 107 (!) 23 100 % -- --  04/24/21 1810 (!) 151/63 -- -- (!) 107 20 100 % -- --  04/24/21 1807 -- -- -- -- -- -- _0  (1.626 m) 74.8 kg  04/24/21 1805 -- -- -- (!) 107 (!) 24 100 % -- --  04/24/21 1802 -- (!) 103.4 F (39.7 C) Rectal -- -- -- -- --  04/24/21 1751 -- -- -- -- -- 97 % -- --    9:16 PM Reevaluation with update and discussion. After initial assessment and treatment, an updated evaluation reveals she is much more alert  and comfortable.  Blood pressure soft.  Vital signs reassuring.Daleen Bo   Medical Decision Making:  This patient is presenting for evaluation of altered mental status and fever, which does require a range of treatment options, and is a complaint that involves a high risk of morbidity and mortality. The differential diagnoses include viral illness, bacterial illness, metabolic disorders and volume disorders. I decided to review old records, and in summary elderly female, living at home, presenting with confusion and fever likely delirium.  I obtained additional historical information from daughter at bedside.  Clinical Laboratory Tests Ordered, included  sepsis bundle, viral panel . Review indicates normal except glucose high, lactate high, BUN high, creatinine high, hemoglobin low, urinalysis abnormal. Radiologic Tests Ordered, included chest x-ray.  I independently Visualized: Radiographic images, which show no infiltrate or edema  Cardiac Monitor Tracing which shows sinus tachycardia    Critical Interventions-evaluation for laboratory testing, IV fluids, empiric antibiotics, radiography, observation and reassessment  After These Interventions, the Patient was reevaluated  and was found with sepsis secondary to UTI.  Confusion secondary to UTI.  Blood pressure soft.  She is currently on hydralazine at home for hypertension.  She requires hospitalization for stabilization.  I suspect she will improve with fluids and antibiotics.  She does not require ICU care at this time.  CRITICAL CARE-yes Performed by: Daleen Bo  9:19 PM-Consult complete with hospitalist. Patient case explained and discussed.  He agrees to admit patient for further evaluation and treatment. Call ended at 9:30 PM       Final Clinical Impression(s) / ED Diagnoses Final diagnoses:  Sepsis with acute renal failure without septic shock, due to unspecified organism, unspecified acute renal failure type [facility])   Confusion    Rx / DC Orders ED Discharge Orders     None        Daleen Bo, MD 04/24/21 2135

## 2021-04-24 NOTE — Assessment & Plan Note (Addendum)
Admit to medical telemetry bed. IV rocephin. Blood and urine cx sent. Pt has documented that she was given 4.25 liters of IVF resuscitation. Monitor for volume overload.

## 2021-04-24 NOTE — ED Triage Notes (Signed)
Per [facility] pt coming from home- family states patient has not been feeling well over the past few days. States less verbal than usual. Pt presents covered in urine and feces. 1,000 mg tylenol PO given by EMS. 200 CC NS given en route.

## 2021-04-24 NOTE — ED Notes (Signed)
X-ray at bedside

## 2021-04-24 NOTE — Assessment & Plan Note (Signed)
In NSR. Continue Eliquis and cardizem.

## 2021-04-24 NOTE — Assessment & Plan Note (Signed)
Hold lasix. Monitor BP.

## 2021-04-24 NOTE — Subjective & Objective (Signed)
CC: altered mental status HPI: 80 year old African-American female with history of diabetes type 2, hypertension, CKD stage III presents the ER today with a 1 day history of altered mental status.  Patient's daughter is with her.  Daughter's name is [name].  Daughter states the patient went to patient's PCP appointment for regular scheduled checkup.  She complained to her PCP that she felt cold.  Patient's dtr  states that no lab work was done.  Patient went to bed that evening.  Did not eat dinner.  She woke up the next morning.  Daughter states that the patient was incontinent of stool and feces.  She had feces running down her leg.  Patient was confused.  EMS was called.  Patient brought to the ER.  Patient noted to be febrile to 103.  Initial lactic acid of 3.1.  UA was positive for nitrates leukoesterase and pyuria.  White count 9, hemoglobin 10.8  Chemistry BUN 33 creatinine 2.4.  Glucose 406.  Baseline creatinine 1.7.  Blood and urine cultures obtained.  Chest x-ray negative for infiltrates.  Patient resuscitated with 4.25 L of IV fluids.  Patient's reported weight is 74.8 kg.  This is 56 mL/kg IV fluid bolus.  [facility] hospitalist contacted for admission.

## 2021-04-24 NOTE — H&P (Signed)
History and Physical    [name] HYW:[id number] DOB: July 11, 1940 DOA: 04/24/2021  PCP: Trey Sailors, PA   Patient coming from: Home  I have personally briefly reviewed patient's old medical records in [place]  CC: altered mental status HPI: 80 year old African-American female with history of diabetes type 2, hypertension, CKD stage III presents the ER today with a 1 day history of altered mental status.  Patient's daughter is with her.  Daughter's name is [name].  Daughter states the patient went to patient's PCP appointment for regular scheduled checkup.  She complained to her PCP that she felt cold.  Patient's dtr  states that no lab work was done.  Patient went to bed that evening.  Did not eat dinner.  She woke up the next morning.  Daughter states that the patient was incontinent of stool and feces.  She had feces running down her leg.  Patient was confused.  EMS was called.  Patient brought to the ER.  Patient noted to be febrile to 103.  Initial lactic acid of 3.1.  UA was positive for nitrates leukoesterase and pyuria.  White count 9, hemoglobin 10.8  Chemistry BUN 33 creatinine 2.4.  Glucose 406.  Baseline creatinine 1.7.  Blood and urine cultures obtained.  Chest x-ray negative for infiltrates.  Patient resuscitated with 4.25 L of IV fluids.  Patient's reported weight is 74.8 kg.  This is 56 mL/kg IV fluid bolus.  [facility] hospitalist contacted for admission.   ED Course: Patient given nearly 60 cc/kg IV fluid bolus.  Blood and urine cultures obtained.  Started on IV Rocephin.  Chest x-ray negative.  Review of Systems:  Review of Systems  Unable to perform ROS: Mental status change   Past Medical History:  Diagnosis Date   Diabetes mellitus without complication ([place])    Hypertension     Past Surgical History:  Procedure Laterality Date   CARDIOVERSION N/A 01/14/2021   Procedure: CARDIOVERSION;  Surgeon: Adrian Prows, MD;  Location: [facility];   Service: Cardiovascular;  Laterality: N/A;   TONSILLECTOMY       reports that she has never smoked. She has never used smokeless tobacco. She reports that she does not drink alcohol and does not use drugs.  Allergies  Allergen Reactions   Peanut-Containing Drug Products     Stomach pain   Amlodipine Swelling    History reviewed. No pertinent family history.  Prior to Admission medications   Medication Sig Start Date End Date Taking? Authorizing Provider  Accu-Chek FastClix Lancets MISC See admin instructions. 01/16/19   [provider]  apixaban (ELIQUIS) 5 MG TABS tablet Take 1 tablet (5 mg total) by mouth 2 (two) times daily. 09/08/20   Jennye Boroughs, MD  BD PEN NEEDLE NANO 2ND GEN 32G X 4 MM MISC 3 (three) times daily. as directed 08/13/19   [provider]  benzonatate (TESSALON) 100 MG capsule Take 100 mg by mouth 3 (three) times daily. 01/20/21   [provider]  Blood Glucose Monitoring Suppl (ACCU-CHEK GUIDE ME) w/Device KIT See admin instructions. 12/11/18   [provider]  Coenzyme Q10 200 MG capsule Take 200 mg by mouth 3 (three) times a week.    [provider]  D-Mannose 500 MG CAPS Take 1 capsule by mouth daily.    [provider]  diltiazem (CARDIZEM CD) 180 MG 24 hr capsule Take 1 capsule (180 mg total) by mouth daily. 02/02/21   C[place]well, Celeste C, PA-C  furosemide (  LASIX) 20 MG tablet Take 40 mg by mouth at bedtime.    [provider]  glimepiride (AMARYL) 2 MG tablet Take 2 mg by mouth daily. 01/02/21   [provider]  glucose blood test strip TEST BLOOD SUGARS 3 4 TIMES DAILY. 10/27/17   [provider]  hydrALAZINE (APRESOLINE) 50 MG tablet TAKE 1 TABLET(50 MG) BY MOUTH THREE TIMES DAILY 11/14/20   C[place]well, Celeste C, PA-C  insulin lispro (HUMALOG) 100 UNIT/ML KwikPen Inject 15 Units into the skin at bedtime. 08/14/19   [provider]  isosorbide dinitrate (ISORDIL) 30 MG tablet TAKE  1 TABLET(30 MG) BY MOUTH THREE TIMES DAILY 04/15/21   Cantwell, Celeste C, PA-C  Menthol-Methyl Salicylate (MUSCLE RUB EX) Apply 1 application topically daily as needed (leg cramps).    [provider]  MISC NATURAL PRODUCTS PO Take 1 capsule by mouth 3 (three) times a week. [place]: HEMP Capsules    [provider]  MISC NATURAL PRODUCTS PO Take 1 capsule by mouth daily. Ultimate Friendly Flora Probiotic    [provider]  Multiple Vitamins-Minerals (MULTIVITAMIN ADULTS) TABS Take 1 tablet by mouth daily.    [provider]  pravastatin (PRAVACHOL) 20 MG tablet Take 1 tablet (20 mg total) by mouth daily. Patient taking differently: Take 20 mg by mouth at bedtime. 12/16/20   Cantwell, Celeste C, PA-C  VITAMIN D PO Take 1 capsule by mouth 3 (three) times a week.    [provider]    Physical Exam: Vitals:   04/24/21 2014 04/24/21 2051 04/24/21 2100 04/24/21 2133  BP: 98/67 99/60 (!) 104/49 108/86  Pulse: 96 95 92 92  Resp: 20 (!) 22 (!) 22 16  Temp:  98.8 F (37.1 C)    TempSrc:  Oral    SpO2: 100% 100% 100% 100%  Weight:      Height:        Physical Exam Vitals and nursing note reviewed.  Constitutional:      General: She is not in acute distress.    Appearance: Normal appearance. She is not ill-appearing, toxic-appearing or diaphoretic.  HENT:     Head: Normocephalic and atraumatic.     Nose: Nose normal. No rhinorrhea.  Eyes:     General: No scleral icterus. Cardiovascular:     Rate and Rhythm: Normal rate and regular rhythm.     Heart sounds: Murmur heard.  Pulmonary:     Effort: Pulmonary effort is normal. No respiratory distress.     Breath sounds: Normal breath sounds. No wheezing or rales.  Abdominal:     General: Abdomen is flat. Bowel sounds are normal. There is no distension.     Tenderness: There is no abdominal tenderness. There is no guarding or rebound.  Musculoskeletal:     Right lower leg: No edema.     Left  lower leg: No edema.  Skin:    General: Skin is warm and dry.     Capillary Refill: Capillary refill takes less than 2 seconds.  Neurological:     General: No focal deficit present.     Mental Status: She is alert.     Comments: Oriented to person but not time.     Labs on Admission: I have personally reviewed following labs and imaging studies  CBC: Recent Labs  Lab 04/24/21 1821  WBC 9.0  NEUTROABS 8.1*  HGB 10.8*  HCT 33.9*  MCV 91.9  PLT 099*   Basic Metabolic Panel: Recent Labs  Lab 04/24/21 1821  NA 138  K 3.7  CL 103  CO2 24  GLUCOSE 406*  BUN 33*  CREATININE 2.44*  CALCIUM 9.2   GFR: Estimated Creatinine Clearance: 18.5 mL/min (A) (by C-G formula based on SCr of 2.44 mg/dL (H)). Liver Function Tests: Recent Labs  Lab 04/24/21 1821  AST 29  ALT 17  ALKPHOS 114  BILITOT 1.0  PROT 7.9  ALBUMIN 3.8   No results for input(s): LIPASE, AMYLASE in the last 168 hours. No results for input(s): AMMONIA in the last 168 hours. Coagulation Profile: Recent Labs  Lab 04/24/21 1821  INR 1.3*   Cardiac Enzymes: No results for input(s): CKTOTAL, CKMB, CKMBINDEX, TROPONINI in the last 168 hours. BNP (last 3 results) No results for input(s): PROBNP in the last 8760 hours. HbA1C: No results for input(s): HGBA1C in the last 72 hours. CBG: Recent Labs  Lab 04/24/21 1846  GLUCAP 350*   Lipid Profile: No results for input(s): CHOL, HDL, LDLCALC, TRIG, CHOLHDL, LDLDIRECT in the last 72 hours. Thyroid Function Tests: No results for input(s): TSH, T4TOTAL, FREET4, T3FREE, THYROIDAB in the last 72 hours. Anemia Panel: No results for input(s): VITAMINB12, FOLATE, FERRITIN, TIBC, IRON, RETICCTPCT in the last 72 hours. Urine analysis:    Component Value Date/Time   COLORURINE YELLOW 04/24/2021 1835   APPEARANCEUR CLOUDY (A) 04/24/2021 1835   LABSPEC 1.020 04/24/2021 1835   PHURINE 6.0 04/24/2021 1835   GLUCOSEU NEGATIVE 04/24/2021 1835   HGBUR LARGE (A)  04/24/2021 1835   BILIRUBINUR NEGATIVE 04/24/2021 1835   KETONESUR NEGATIVE 04/24/2021 1835   PROTEINUR >300 (A) 04/24/2021 1835   NITRITE POSITIVE (A) 04/24/2021 1835   LEUKOCYTESUR MODERATE (A) 04/24/2021 1835    Radiological Exams on Admission: I have personally reviewed images DG Chest Port 1 View  Result Date: 04/24/2021 CLINICAL DATA:  Possible sepsis EXAM: PORTABLE CHEST 1 VIEW COMPARISON:  09/06/2020 FINDINGS: Cardiac shadow is mildly prominent but stable. Calcified granulomas are noted bilaterally. No focal infiltrate or effusion is seen. No bony abnormality is noted. IMPRESSION: No active disease. Electronically Signed   By: Inez Catalina M.D.   On: 04/24/2021 19:00    EKG: I have personally reviewed EKG: sinus tachycardia, RBBB    Assessment/Plan Principal Problem:   Sepsis with acute organ dysfunction without septic shock (HCC) Active Problems:   Acute cystitis without hematuria   Acute kidney injury superimposed on chronic kidney disease (HCC)   Essential hypertension   Persistent atrial fibrillation (HCC)   DM (diabetes mellitus), type 2 with renal complications ([place])    Sepsis with acute organ dysfunction without septic shock ([place]) Admit to medical telemetry bed. IV rocephin. Blood and urine cx sent. Pt has documented that she was given 4.25 liters of IVF resuscitation. Monitor for volume overload.  Acute cystitis without hematuria IV rocephin. Follow urine cultures.  Acute kidney injury superimposed on chronic kidney disease ([place]) Continue with gentle IVF due to 4.25 liters of IVF resuscitation. Repeat BMP in AM. Hold all nephrotoxic drugs. Pt's baseline Scr 1.7  Essential hypertension Hold lasix. Monitor BP.  Type 2 diabetes mellitus without complications (HCC) SSI. Hold amaryl.  Persistent atrial fibrillation (HCC) In NSR. Continue Eliquis and cardizem.  DM (diabetes mellitus), type 2 with renal complications (HCC) SSI. Hold amaryl.  DVT prophylaxis:  Eliquis Code Status: Full Code Family Communication: discussed at bedside with pt's dtr tammy. Pt lives with dtr tammy  Disposition Plan: return home  Consults called: none  Admission status: Inpatient, Telemetry  bed   Kristopher Oppenheim, DO [facility] 04/24/2021, 9:58 PM

## 2021-04-24 NOTE — Sepsis Progress Note (Signed)
Notified provider of need to order repeat lactic acid. ° °

## 2021-04-24 NOTE — Sepsis Progress Note (Signed)
Elink following Code Sepsis. 

## 2021-04-24 NOTE — Assessment & Plan Note (Signed)
IV rocephin. Follow urine cultures.

## 2021-04-25 ENCOUNTER — Inpatient Hospital Stay (HOSPITAL_COMMUNITY): Payer: Medicare HMO

## 2021-04-25 DIAGNOSIS — R6521 Severe sepsis with septic shock: Secondary | ICD-10-CM

## 2021-04-25 DIAGNOSIS — A419 Sepsis, unspecified organism: Secondary | ICD-10-CM | POA: Diagnosis not present

## 2021-04-25 DIAGNOSIS — N3000 Acute cystitis without hematuria: Secondary | ICD-10-CM | POA: Diagnosis not present

## 2021-04-25 DIAGNOSIS — N179 Acute kidney failure, unspecified: Secondary | ICD-10-CM | POA: Diagnosis not present

## 2021-04-25 DIAGNOSIS — A4151 Sepsis due to Escherichia coli [E. coli]: Secondary | ICD-10-CM | POA: Diagnosis not present

## 2021-04-25 DIAGNOSIS — R062 Wheezing: Secondary | ICD-10-CM

## 2021-04-25 DIAGNOSIS — R410 Disorientation, unspecified: Secondary | ICD-10-CM

## 2021-04-25 DIAGNOSIS — R7881 Bacteremia: Secondary | ICD-10-CM | POA: Diagnosis present

## 2021-04-25 LAB — BLOOD CULTURE ID PANEL (REFLEXED) - BCID2

## 2021-04-25 LAB — CBC WITH DIFFERENTIAL/PLATELET
Abs Immature Granulocytes: 1.17 10*3/uL — ABNORMAL HIGH (ref 0.00–0.07)
Basophils Absolute: 0.1 10*3/uL (ref 0.0–0.1)
Basophils Relative: 0 %
Eosinophils Absolute: 1.7 10*3/uL — ABNORMAL HIGH (ref 0.0–0.5)
Eosinophils Relative: 9 %
HCT: 30.9 % — ABNORMAL LOW (ref 36.0–46.0)
Hemoglobin: 9.9 g/dL — ABNORMAL LOW (ref 12.0–15.0)
Immature Granulocytes: 6 %
Lymphocytes Relative: 6 %
Lymphs Abs: 1.1 10*3/uL (ref 0.7–4.0)
MCH: 29.9 pg (ref 26.0–34.0)
MCHC: 32 g/dL (ref 30.0–36.0)
MCV: 93.4 fL (ref 80.0–100.0)
Monocytes Absolute: 1.1 10*3/uL — ABNORMAL HIGH (ref 0.1–1.0)
Monocytes Relative: 6 %
Neutro Abs: 14.1 10*3/uL — ABNORMAL HIGH (ref 1.7–7.7)
Neutrophils Relative %: 73 %
Platelets: 67 10*3/uL — ABNORMAL LOW (ref 150–400)
RBC: 3.31 MIL/uL — ABNORMAL LOW (ref 3.87–5.11)
RDW: 14.1 % (ref 11.5–15.5)
WBC: 19.2 10*3/uL — ABNORMAL HIGH (ref 4.0–10.5)
nRBC: 0 % (ref 0.0–0.2)

## 2021-04-25 LAB — BASIC METABOLIC PANEL
Anion gap: 12 (ref 5–15)
Anion gap: 12 (ref 5–15)
BUN: 47 mg/dL — ABNORMAL HIGH (ref 8–23)
BUN: 48 mg/dL — ABNORMAL HIGH (ref 8–23)
CO2: 22 mmol/L (ref 22–32)
CO2: 21 mmol/L — ABNORMAL LOW (ref 22–32)
Calcium: 7.7 mg/dL — ABNORMAL LOW (ref 8.9–10.3)
Calcium: 7.8 mg/dL — ABNORMAL LOW (ref 8.9–10.3)
Chloride: 105 mmol/L (ref 98–111)
Chloride: 105 mmol/L (ref 98–111)
Creatinine, Ser: 3.53 mg/dL — ABNORMAL HIGH (ref 0.44–1.00)
Creatinine, Ser: 3.5 mg/dL — ABNORMAL HIGH (ref 0.44–1.00)
GFR, Estimated: 13 mL/min — ABNORMAL LOW (ref >60)
GFR, Estimated: 13 mL/min — ABNORMAL LOW (ref >60)
Glucose, Bld: 176 mg/dL — ABNORMAL HIGH (ref 70–99)
Glucose, Bld: 159 mg/dL — ABNORMAL HIGH (ref 70–99)
Potassium: 3.7 mmol/L (ref 3.5–5.1)
Potassium: 4.1 mmol/L (ref 3.5–5.1)
Sodium: 139 mmol/L (ref 135–145)
Sodium: 138 mmol/L (ref 135–145)

## 2021-04-25 LAB — COMPREHENSIVE METABOLIC PANEL
ALT: 31 U/L (ref 0–44)
AST: 63 U/L — ABNORMAL HIGH (ref 15–41)
Albumin: 3 g/dL — ABNORMAL LOW (ref 3.5–5.0)
Alkaline Phosphatase: 62 U/L (ref 38–126)
Anion gap: 12 (ref 5–15)
BUN: 36 mg/dL — ABNORMAL HIGH (ref 8–23)
CO2: 23 mmol/L (ref 22–32)
Calcium: 8.5 mg/dL — ABNORMAL LOW (ref 8.9–10.3)
Chloride: 104 mmol/L (ref 98–111)
Creatinine, Ser: 3.08 mg/dL — ABNORMAL HIGH (ref 0.44–1.00)
GFR, Estimated: 15 mL/min — ABNORMAL LOW (ref >60)
Glucose, Bld: 372 mg/dL — ABNORMAL HIGH (ref 70–99)
Potassium: 4.4 mmol/L (ref 3.5–5.1)
Sodium: 139 mmol/L (ref 135–145)
Total Bilirubin: 1 mg/dL (ref 0.3–1.2)
Total Protein: 6.5 g/dL (ref 6.5–8.1)

## 2021-04-25 LAB — LACTIC ACID, PLASMA
Lactic Acid, Venous: 3.9 mmol/L (ref 0.5–1.9)
Lactic Acid, Venous: 4.3 mmol/L (ref 0.5–1.9)
Lactic Acid, Venous: 3 mmol/L (ref 0.5–1.9)
Lactic Acid, Venous: 4 mmol/L (ref 0.5–1.9)
Lactic Acid, Venous: 3.4 mmol/L (ref 0.5–1.9)
Lactic Acid, Venous: 3.3 mmol/L (ref 0.5–1.9)

## 2021-04-25 LAB — CBG MONITORING, ED
Glucose-Capillary: 382 mg/dL — ABNORMAL HIGH (ref 70–99)
Glucose-Capillary: 251 mg/dL — ABNORMAL HIGH (ref 70–99)
Glucose-Capillary: 212 mg/dL — ABNORMAL HIGH (ref 70–99)

## 2021-04-25 LAB — HEMOGLOBIN A1C
Hgb A1c MFr Bld: 9.6 % — ABNORMAL HIGH (ref 4.8–5.6)
Mean Plasma Glucose: 228.82 mg/dL

## 2021-04-25 LAB — GLUCOSE, CAPILLARY
Glucose-Capillary: 152 mg/dL — ABNORMAL HIGH (ref 70–99)
Glucose-Capillary: 142 mg/dL — ABNORMAL HIGH (ref 70–99)

## 2021-04-25 LAB — MRSA NEXT GEN BY PCR, NASAL: MRSA by PCR Next Gen: NOT DETECTED

## 2021-04-25 MED ORDER — PANTOPRAZOLE SODIUM 40 MG IV SOLR
40.0000 mg | Freq: Every day | INTRAVENOUS | Status: DC
  Administered 2021-04-25 – 2021-05-01 (×7): 40 mg via INTRAVENOUS
  Filled 2021-04-25 (×8): qty 40

## 2021-04-25 MED ORDER — ACETAMINOPHEN 325 MG PO TABS
650.0000 mg | ORAL_TABLET | Freq: Four times a day (QID) | ORAL | Status: DC | PRN
  Administered 2021-04-25 – 2021-05-01 (×7): 650 mg via ORAL
  Filled 2021-04-25 (×8): qty 2

## 2021-04-25 MED ORDER — ORAL CARE MOUTH RINSE
15.0000 mL | Freq: Two times a day (BID) | OROMUCOSAL | Status: DC
  Administered 2021-04-25 – 2021-05-02 (×13): 15 mL via OROMUCOSAL

## 2021-04-25 MED ORDER — DILTIAZEM HCL ER COATED BEADS 180 MG PO CP24
180.0000 mg | ORAL_CAPSULE | Freq: Every day | ORAL | Status: DC
  Administered 2021-04-25: 10:00:00 180 mg via ORAL
  Filled 2021-04-25: qty 1

## 2021-04-25 MED ORDER — HYDRALAZINE HCL 50 MG PO TABS
50.0000 mg | ORAL_TABLET | Freq: Three times a day (TID) | ORAL | Status: DC
  Administered 2021-04-25: 10:00:00 50 mg via ORAL
  Filled 2021-04-25: qty 2

## 2021-04-25 MED ORDER — LACTATED RINGERS IV BOLUS
1000.0000 mL | Freq: Once | INTRAVENOUS | Status: AC
  Administered 2021-04-25: 17:00:00 1000 mL via INTRAVENOUS

## 2021-04-25 MED ORDER — ONDANSETRON HCL 4 MG/2ML IJ SOLN
4.0000 mg | Freq: Four times a day (QID) | INTRAMUSCULAR | Status: DC | PRN
  Administered 2021-04-28: 18:00:00 4 mg via INTRAVENOUS
  Filled 2021-04-25: qty 2

## 2021-04-25 MED ORDER — INSULIN ASPART 100 UNIT/ML IJ SOLN
0.0000 [IU] | Freq: Every day | INTRAMUSCULAR | Status: DC
  Administered 2021-04-25: 02:00:00 5 [IU] via SUBCUTANEOUS
  Administered 2021-04-27: 22:00:00 2 [IU] via SUBCUTANEOUS
  Filled 2021-04-25: qty 0.05

## 2021-04-25 MED ORDER — LACTATED RINGERS IV SOLN: INTRAVENOUS | Status: AC

## 2021-04-25 MED ORDER — CHLORHEXIDINE GLUCONATE CLOTH 2 % EX PADS
6.0000 | MEDICATED_PAD | Freq: Every day | CUTANEOUS | Status: DC
  Administered 2021-04-26 – 2021-05-01 (×6): 6 via TOPICAL

## 2021-04-25 MED ORDER — IPRATROPIUM BROMIDE 0.02 % IN SOLN: 0.5000 mg | Freq: Four times a day (QID) | RESPIRATORY_TRACT | Status: DC

## 2021-04-25 MED ORDER — INSULIN ASPART 100 UNIT/ML IJ SOLN
0.0000 [IU] | Freq: Three times a day (TID) | INTRAMUSCULAR | Status: DC
  Administered 2021-04-25: 13:00:00 5 [IU] via SUBCUTANEOUS
  Administered 2021-04-25 (×2): 3 [IU] via SUBCUTANEOUS
  Administered 2021-04-26 (×2): 2 [IU] via SUBCUTANEOUS
  Administered 2021-04-26: 12:00:00 3 [IU] via SUBCUTANEOUS
  Administered 2021-04-27: 18:00:00 5 [IU] via SUBCUTANEOUS
  Administered 2021-04-27: 12:00:00 2 [IU] via SUBCUTANEOUS
  Administered 2021-04-28: 08:00:00 15 [IU] via SUBCUTANEOUS
  Filled 2021-04-25: qty 0.15

## 2021-04-25 MED ORDER — SODIUM CHLORIDE 0.9 % IV SOLN
2.0000 g | INTRAVENOUS | Status: DC
  Administered 2021-04-25 – 2021-04-26 (×2): 2 g via INTRAVENOUS
  Filled 2021-04-25 (×3): qty 20

## 2021-04-25 MED ORDER — ONDANSETRON HCL 4 MG PO TABS: 4.0000 mg | ORAL_TABLET | Freq: Four times a day (QID) | ORAL | Status: DC | PRN

## 2021-04-25 MED ORDER — ALBUTEROL SULFATE (2.5 MG/3ML) 0.083% IN NEBU
2.5000 mg | INHALATION_SOLUTION | Freq: Once | RESPIRATORY_TRACT | Status: AC | PRN
  Administered 2021-04-25: 17:00:00 2.5 mg via RESPIRATORY_TRACT
  Filled 2021-04-25: qty 3

## 2021-04-25 MED ORDER — SODIUM CHLORIDE 0.9 % IV BOLUS
1000.0000 mL | Freq: Once | INTRAVENOUS | Status: AC
  Administered 2021-04-25: 10:00:00 1000 mL via INTRAVENOUS

## 2021-04-25 MED ORDER — ACETAMINOPHEN 650 MG RE SUPP: 650.0000 mg | Freq: Four times a day (QID) | RECTAL | Status: DC | PRN

## 2021-04-25 MED ORDER — APIXABAN 5 MG PO TABS: 5.0000 mg | ORAL_TABLET | Freq: Two times a day (BID) | ORAL | Status: DC

## 2021-04-25 NOTE — Progress Notes (Addendum)
[facility] Progress Note [name] DOB: [birth year] MRN: [id number]   Date of Service  04/25/2021  HPI/Events of Note  Asked to follow a BMP and lactate. BMP not ordered in system. Order placed to draw with LA and notified RN to call us when resulted.   eICU Interventions  As above      Intervention Category Major Interventions: Acute renal failure - evaluation and management  [name] 04/25/2021, 7:15 PM  Addendum at 820 pm Noted BMP and lactate LA improving , worsening renal failure K and bicarb are ok I called and spoke with bedside RN who notified me that when she started her evening shift, patient was getting a fluid bolus No urine output charted and foley placed in evening has only 50 cc out She has been given multiple liters of fluid and has made no urine and has worsening creatinine CT done in September 2022 showed a ureteral stone No renal imaging available this time  Will obtain stat CT to rule out urinary obstruction, she does have urosepsis She is under the hospitalist service so I requested RN notify the hospitalist of my suggestions  Addendum at 11:10 pm No obstruction noted on CT Continue current care and repeat labs in AM  Addendum at 5 am Notified of AM labs Remains on room air Around 200 cc urine out for this shift Also notified that she is still with diarrhea Will give 500 cc LR over 2 hours and re-assess If she still has ongoing volume loss then may need more fluid  Also ordered C diff testing since she continues to have liquid stool , WBC is 17 this AM  Will need additional follow up - may require nephrology to evaluate today

## 2021-04-25 NOTE — Progress Notes (Signed)
PROGRESS NOTE    [name]  SAY:[id number] DOB: 04/27/1941 DOA: 04/24/2021 PCP: Trey Sailors, PA   Brief Narrative:  The patient is a 80 year old African-American female with past medical history significant for but not limited to diabetes mellitus type 2, hypertension, CKD stage IIIb as well as other comorbidities who presented to the ED with a chief complaint of altered mental status.  Patient went to her PCP appointment for regular checkup she complained to her PCP that she felt cold.  Patient's daughter states that there is no blood work done that day and then she went to bed the evening.  She did not eat dinner and woke up the next morning and is now to be incontinent of stool and feces.  She had feces running down her leg and patient was significantly confused.  Patient was noted to be febrile and EMS was called and she was brought to the ED and initial lactate level was 3.1 and UA was positive for nitrites, leuk esterase and pyuria.  She had an elevated BUN/creatinine from her baseline of 1.7 and chest x-ray was negative for any acute infiltrates.  She is given 4.25 L of IV fluid boluses and placed on maintenance IV fluid hydration and blood and urine cultures obtained.  She is started on IV Rocephin and then further work-up revealed that she got bacteremia.  Given that she consistently had elevated lactic acid level she was moved to stepdown unit and critical care was consulted.  Assessment & Plan:   Principal Problem:   Sepsis with acute organ dysfunction without septic shock (HCC) Active Problems:   Essential hypertension   Persistent atrial fibrillation (HCC)   DM (diabetes mellitus), type 2 with renal complications (HCC)   Acute kidney injury superimposed on chronic kidney disease ([place])   Acute cystitis without hematuria   Bacteremia  Currently she is being admitted for:  Septic shock with acute organ dysfunction with acute on chronic kidney disease in the setting of E.  coli bacteremia and E. coli UTI -She is admitted to a medical telemetry bed with this was changed to the stepdown unit -Initially given 4.25 L of IV fluid resuscitation and will give her another liter today and start on maintenance IV fluid hydration -Blood cultures x2 showed 1 out of 2 with E. coli and urine culture consistent with UTI -Continue with empiric ceftriaxone -Continue to monitor temperature curve and WBC count -Because her lactate remain consistently elevated critical care was consulted and recommended another liter bolus -WBC worsened and went from 9.0 is now 19.9 -If necessary she will be placed on peripheral Levophed  Acute toxic metabolic encephalopathy in the setting of infection as above -Continue with supporting care and treat underlying causes -Avoid sedating medications if possible and continue reorientation and placed on delirium precautions  Acute cystitis with hematuria -As above follow urine cultures and continue IV ceftriaxone  AKI on superimposed on chronic kidney disease stage IIIb -She was given 4.25 L of normal saline boluses in the ED and an additional 1 L centimeters and another 1 by critical care. -Currently getting IV fluid hydration at normal saline 75 MLS per hour -Strict I's and O's and daily weights -Avoid nephrotoxic medications, contrast dyes, hypotension dehydration and renally dose medications -Continue to monitor and trend BUN/creatinine and is gone from 33/2.44 -> 36/3.08 -> 47/3.53 -Repeat CMP in a.m.  Essential hypertension -Holding her Lasix and continue monitor blood pressures is on the lower side -Continue monitor blood pressures per  protocol and she has been moved to the stepdown unit for closer blood pressure monitoring  Diabetes mellitus type 2 with renal complications -Continue sliding scale insulin hold her Amaryl -CBGs ranging from 142-382  Diarrhea -Could be related to underlying infection versus a viral process -She is  getting IV fluid maintenance and C. difficile may be checked if she continues to worsen  Paroxysmal atrial fibrillation -She is in normal sinus rhythm and will continue anticoagulation with Eliquis but hold her Cardizem  Thrombocytopenia -In the setting of infection as above and sepsis -Platelet count from 106 -> 67 -Continue to monitor for signs and symptoms bleeding; currently no overt bleeding noted -Repeat CBC in a.m.  DVT prophylaxis: SCDs: Her apixaban has not been resumed but will hold for now and consider heparin drip if intervention is needed Code Status: FULL CODE Family Communication: Discussed with the daughter and son who was over the telephone Disposition Plan: Pending further clinical improvement  Status is: Inpatient  Remains inpatient appropriate because: Remains confused and is sick enough to be in the stepdown unit  Consultants:  PCCM  Procedures: None   Antimicrobials:  Anti-infectives (From admission, onward)    Start     Dose/Rate Route Frequency Ordered Stop   04/25/21 1800  cefTRIAXone (ROCEPHIN) 2 g in sodium chloride 0.9 % 100 mL IVPB        2 g 200 mL/hr over 30 Minutes Intravenous Every 24 hours 04/25/21 1012 05/01/21 1759   04/24/21 2030  cefTRIAXone (ROCEPHIN) 2 g in sodium chloride 0.9 % 100 mL IVPB  Status:  Discontinued        2 g 200 mL/hr over 30 Minutes Intravenous Every 24 hours 04/24/21 2017 04/25/21 1012        Subjective: Seen and examined at bedside and she is confused still.  Lactic acid level still remains elevated.  No chest pain or shortness of breath but really unable to give me a subjective history due to her current condition.  No other concerns or complaints at this time.  Family at bedside and states that she has been having some loose stools and was noted to have mental status change and confused yesterday  Objective: Vitals:   04/25/21 1853 04/25/21 1854 04/25/21 1916 04/25/21 2000  BP:    (!) 90/46  Pulse:    71   Resp: 16 18  20   Temp:   98.3 F (36.8 C)   TempSrc:   Oral   SpO2:    97%  Weight:      Height:        Intake/Output Summary (Last 24 hours) at 04/25/2021 2108 Last data filed at 04/25/2021 1726 Gross per 24 hour  Intake 4317.86 ml  Output 5 ml  Net 4312.86 ml   Filed Weights   04/24/21 1807 04/25/21 1655  Weight: 74.8 kg 72.9 kg   Examination: Physical Exam:  Constitutional: WN/WD overweight chronically ill-appearing African-American female currently in no acute distress appears calm but confused  Eyes: Lids and conjunctivae normal, sclerae anicteric  ENMT: External Ears, Nose appear normal. Grossly normal hearing. Mucous membranes are moist.  Neck: Appears normal, supple, no cervical masses, normal ROM, no appreciable thyromegaly; no appreciable JVD Respiratory: Diminished to auscultation bilaterally with some upper expiratory wheezing, no wheezing, rales, rhonchi or crackles. Normal respiratory effort and patient is not tachypenic.  Unlabored breathing and not wearing any supplemental oxygen nasal cannula Cardiovascular: Slightly tachycardic, no murmurs / rubs / gallops. S1 and S2 auscultated.  Slight lower extremity edema Abdomen: Soft, non-tender, non-distended. Bowel sounds positive.  GU: Deferred. Musculoskeletal: No clubbing / cyanosis of digits/nails. No joint deformity upper and lower extremities.  Skin: No rashes, lesions, ulcers on limited skin evaluation. No induration; Warm and dry.  Neurologic: CN 2-12 grossly intact with no focal deficits. Romberg sign cerebellar reflexes not assessed.  Psychiatric: Impaired judgment and insight.  Remains confused  Data Reviewed: I have personally reviewed following labs and imaging studies  CBC: Recent Labs  Lab 04/24/21 1821 04/25/21 0413  WBC 9.0 19.2*  NEUTROABS 8.1* 14.1*  HGB 10.8* 9.9*  HCT 33.9* 30.9*  MCV 91.9 93.4  PLT 106* 67*   Basic Metabolic Panel: Recent Labs  Lab 04/24/21 1821 04/25/21 0413  04/25/21 1835  NA 138 139 139  K 3.7 4.4 3.7  CL 103 104 105  CO2 24 23 22   GLUCOSE 406* 372* 176*  BUN 33* 36* 47*  CREATININE 2.44* 3.08* 3.53*  CALCIUM 9.2 8.5* 7.7*   GFR: Estimated Creatinine Clearance: 12.6 mL/min (A) (by C-G formula based on SCr of 3.53 mg/dL (H)). Liver Function Tests: Recent Labs  Lab 04/24/21 1821 04/25/21 0413  AST 29 63*  ALT 17 31  ALKPHOS 114 62  BILITOT 1.0 1.0  PROT 7.9 6.5  ALBUMIN 3.8 3.0*   No results for input(s): LIPASE, AMYLASE in the last 168 hours. No results for input(s): AMMONIA in the last 168 hours. Coagulation Profile: Recent Labs  Lab 04/24/21 1821  INR 1.3*   Cardiac Enzymes: No results for input(s): CKTOTAL, CKMB, CKMBINDEX, TROPONINI in the last 168 hours. BNP (last 3 results) No results for input(s): PROBNP in the last 8760 hours. HbA1C: Recent Labs    04/25/21 0413  HGBA1C 9.6*   CBG: Recent Labs  Lab 04/24/21 1846 04/25/21 0224 04/25/21 0737 04/25/21 1147 04/25/21 1636  GLUCAP 350* 382* 251* 212* 152*   Lipid Profile: No results for input(s): CHOL, HDL, LDLCALC, TRIG, CHOLHDL, LDLDIRECT in the last 72 hours. Thyroid Function Tests: No results for input(s): TSH, T4TOTAL, FREET4, T3FREE, THYROIDAB in the last 72 hours. Anemia Panel: No results for input(s): VITAMINB12, FOLATE, FERRITIN, TIBC, IRON, RETICCTPCT in the last 72 hours. Sepsis Labs: Recent Labs  Lab 04/25/21 0547 04/25/21 1112 04/25/21 1357 04/25/21 1835  LATICACIDVEN 4.3* 3.0* 4.0* 3.4*    Recent Results (from the past 240 hour(s))  Blood Culture (routine x 2)     Status: None (Preliminary result)   Collection Time: 04/24/21  6:21 PM   Specimen: BLOOD  Result Value Ref Range Status   Specimen Description   Final    BLOOD LEFT ANTECUBITAL Performed at [facility], [place] [address] [place], [place] [address]    Special Requests   Final    BOTTLES DRAWN AEROBIC AND ANAEROBIC Blood Culture results may not be  optimal due to an excessive volume of blood received in culture bottles Performed at [facility] [address], [place], [place] [address]    Culture  Setup Time   Final    GRAM NEGATIVE RODS IN BOTH AEROBIC AND ANAEROBIC BOTTLES CRITICAL RESULT CALLED TO, READ BACK BY AND VERIFIED WITH: D,WOFFORD PHARMD @1005  04/25/21 EB Performed at [facility] [address], [place], [place] [address]    Culture GRAM NEGATIVE RODS  Final   Report Status PENDING  Incomplete  Blood Culture ID Panel (Reflexed)     Status: Abnormal   Collection Time: 04/24/21  6:21 PM  Result Value Ref Range Status   Enterococcus faecalis NOT DETECTED NOT DETECTED Final   Enterococcus Faecium NOT DETECTED NOT DETECTED Final   Listeria monocytogenes NOT DETECTED NOT DETECTED Final   Staphylococcus species NOT DETECTED NOT DETECTED Final   Staphylococcus aureus (BCID) NOT DETECTED NOT DETECTED Final   Staphylococcus epidermidis NOT DETECTED NOT DETECTED Final   Staphylococcus lugdunensis NOT DETECTED NOT DETECTED Final   Streptococcus species NOT DETECTED NOT DETECTED Final   Streptococcus agalactiae NOT DETECTED NOT DETECTED Final   Streptococcus pneumoniae NOT DETECTED NOT DETECTED Final   Streptococcus pyogenes NOT DETECTED NOT DETECTED Final   A.calcoaceticus-baumannii NOT DETECTED NOT DETECTED Final   Bacteroides fragilis NOT DETECTED NOT DETECTED Final   Enterobacterales DETECTED (A) NOT DETECTED Final    Comment: Enterobacterales represent a large order of gram negative bacteria, not a single organism. CRITICAL RESULT CALLED TO, READ BACK BY AND VERIFIED WITH: D,WOFFORD PHARMD @1005  04/25/21 EB    Enterobacter cloacae complex NOT DETECTED NOT DETECTED Final   Escherichia coli DETECTED (A) NOT DETECTED Final    Comment: CRITICAL RESULT CALLED TO, READ BACK BY AND VERIFIED WITH: D,WOFFORD PHARMD @1005  04/25/21 EB    Klebsiella aerogenes NOT DETECTED NOT DETECTED Final    Klebsiella oxytoca NOT DETECTED NOT DETECTED Final   Klebsiella pneumoniae NOT DETECTED NOT DETECTED Final   Proteus species NOT DETECTED NOT DETECTED Final   Salmonella species NOT DETECTED NOT DETECTED Final   Serratia marcescens NOT DETECTED NOT DETECTED Final   Haemophilus influenzae NOT DETECTED NOT DETECTED Final   Neisseria meningitidis NOT DETECTED NOT DETECTED Final   Pseudomonas aeruginosa NOT DETECTED NOT DETECTED Final   Stenotrophomonas maltophilia NOT DETECTED NOT DETECTED Final   Candida albicans NOT DETECTED NOT DETECTED Final   Candida auris NOT DETECTED NOT DETECTED Final   Candida glabrata NOT DETECTED NOT DETECTED Final   Candida krusei NOT DETECTED NOT DETECTED Final   Candida parapsilosis NOT DETECTED NOT DETECTED Final   Candida tropicalis NOT DETECTED NOT DETECTED Final   Cryptococcus neoformans/gattii NOT DETECTED NOT DETECTED Final   CTX-M ESBL NOT DETECTED NOT DETECTED Final   Carbapenem resistance IMP NOT DETECTED NOT DETECTED Final   Carbapenem resistance KPC NOT DETECTED NOT DETECTED Final   Carbapenem resistance NDM NOT DETECTED NOT DETECTED Final   Carbapenem resist OXA 48 LIKE NOT DETECTED NOT DETECTED Final   Carbapenem resistance VIM NOT DETECTED NOT DETECTED Final    Comment: Performed at [facility], [address], [place], [place] [address]  Resp Panel by RT-PCR (Flu A&B, Covid) Nasopharyngeal Swab     Status: None   Collection Time: 04/24/21  6:24 PM   Specimen: Nasopharyngeal Swab; Nasopharyngeal(NP) swabs in vial transport medium  Result Value Ref Range Status   SARS Coronavirus 2 by RT PCR NEGATIVE NEGATIVE Final    Comment: (NOTE) SARS-CoV-2 target nucleic acids are NOT DETECTED.  The SARS-CoV-2 RNA is generally detectable in upper respiratory specimens during the acute phase of infection. The lowest concentration of SARS-CoV-2 viral copies this assay can detect is 138 copies/mL. A negative result does not preclude  SARS-Cov-2 infection and should not be used as the sole basis for treatment or other patient management decisions. A negative result may occur with  improper specimen collection/handling, submission of specimen other than nasopharyngeal swab, presence of viral mutation(s) within the areas targeted by this assay, and inadequate number of viral copies(<138 copies/mL). A negative result must be combined with clinical observations, patient history,  and epidemiological information. The expected result is Negative.  Fact Sheet for Patients:  EntrepreneurPulse.com.au  Fact Sheet for Healthcare Providers:  IncredibleEmployment.be  This test is no t yet approved or cleared by the [place] FDA and  has been authorized for detection and/or diagnosis of SARS-CoV-2 by FDA under an Emergency Use Authorization (EUA). This EUA will remain  in effect (meaning this test can be used) for the duration of the COVID-19 declaration under Section 564(b)(1) of the Act, 21 U.S.C.section 360bbb-3(b)(1), unless the authorization is terminated  or revoked sooner.       Influenza A by PCR NEGATIVE NEGATIVE Final   Influenza B by PCR NEGATIVE NEGATIVE Final    Comment: (NOTE) The Xpert Xpress SARS-CoV-2/FLU/RSV plus assay is intended as an aid in the diagnosis of influenza from Nasopharyngeal swab specimens and should not be used as a sole basis for treatment. Nasal washings and aspirates are unacceptable for Xpert Xpress SARS-CoV-2/FLU/RSV testing.  Fact Sheet for Patients: EntrepreneurPulse.com.au  Fact Sheet for Healthcare Providers: IncredibleEmployment.be  This test is not yet approved or cleared by the [place] FDA and has been authorized for detection and/or diagnosis of SARS-CoV-2 by FDA under an Emergency Use Authorization (EUA). This EUA will remain in effect (meaning this test can be used) for the duration of  the COVID-19 declaration under Section 564(b)(1) of the Act, 21 U.S.C. section 360bbb-3(b)(1), unless the authorization is terminated or revoked.  Performed at [facility], [place] [address] [place], [place] [address]   Blood Culture (routine x 2)     Status: None (Preliminary result)   Collection Time: 04/24/21  8:31 PM   Specimen: BLOOD  Result Value Ref Range Status   Specimen Description   Final    BLOOD Performed at [facility] [address], [place], [place] [address]    Special Requests   Final    BOTTLES DRAWN AEROBIC AND ANAEROBIC Blood Culture adequate volume Performed at [facility] [address] [place], [place] [address]    Culture  Setup Time   Final    GRAM NEGATIVE RODS IN BOTH AEROBIC AND ANAEROBIC BOTTLES CRITICAL VALUE NOTED.  VALUE IS CONSISTENT WITH PREVIOUSLY REPORTED AND CALLED VALUE. Performed at [facility], [place] [address], [place], [place] [address]    Culture GRAM NEGATIVE RODS  Final   Report Status PENDING  Incomplete  MRSA Next Gen by PCR, Nasal     Status: None   Collection Time: 04/25/21  4:50 PM   Specimen: Nasal Mucosa; Nasal Swab  Result Value Ref Range Status   MRSA by PCR Next Gen NOT DETECTED NOT DETECTED Final    Comment: (NOTE) The GeneXpert MRSA Assay (FDA approved for NASAL specimens only), is one component of a comprehensive MRSA colonization surveillance program. It is not intended to diagnose MRSA infection nor to guide or monitor treatment for MRSA infections. Test performance is not FDA approved in patients less than 64 years old. Performed at [facility], [place] [address], [place], [place] [address]     RN Pressure Injury Documentation:     Estimated body mass index is 27.59 kg/m as calculated from the following:   Height as of this encounter: 5\' 4"  (1.626 m).   Weight as of this encounter: 72.9 kg.  Malnutrition Type:    Malnutrition Characteristics:   Nutrition Interventions:    Radiology Studies: DG CHEST PORT 1 VIEW  Result Date: 04/25/2021 CLINICAL DATA:  Wheezing EXAM: PORTABLE CHEST 1 VIEW COMPARISON:  April 24, 2021 FINDINGS: The cardiomediastinal silhouette is unchanged in contour.Atherosclerotic calcifications. No pleural effusion. No pneumothorax. No acute pleuroparenchymal abnormality. Visualized abdomen is unremarkable. IMPRESSION: No acute cardiopulmonary abnormality. Electronically Signed   By: Valentino Saxon M.D.   On: 04/25/2021 17:57   DG Chest Port 1 View  Result Date: 04/24/2021 CLINICAL DATA:  Possible sepsis EXAM: PORTABLE CHEST 1 VIEW COMPARISON:  09/06/2020 FINDINGS: Cardiac shadow is mildly prominent but stable. Calcified granulomas are noted bilaterally. No focal infiltrate or effusion is seen. No bony abnormality is noted. IMPRESSION: No active disease. Electronically Signed   By: Inez Catalina M.D.   On: 04/24/2021 19:00    Scheduled Meds:  [START ON 04/26/2021] Chlorhexidine Gluconate Cloth  6 each Topical Q0600   insulin aspart  0-15 Units Subcutaneous TID WC   insulin aspart  0-5 Units Subcutaneous QHS   mouth rinse  15 mL Mouth Rinse BID   pantoprazole (PROTONIX) IV  40 mg Intravenous QHS   Continuous Infusions:  cefTRIAXone (ROCEPHIN)  IV Stopped (04/25/21 1828)   lactated ringers 75 mL/hr at 04/25/21 1421    LOS: 1 day   Kerney Elbe, DO [facility] PAGER is on AMION  If 7PM-7AM, please contact night-coverage [email or web address]

## 2021-04-25 NOTE — Consult Note (Signed)
NAME:  [name], MRN:  [id number], DOB:  11-21-1940, LOS: 1 ADMISSION DATE:  04/24/2021, CONSULTATION DATE:  04/25/21 REFERRING MD:  Dr Alfredia Ferguson, CHIEF COMPLAINT:  septic shock   History of Present Illness:  80 year old woman with history of type 2 diabetes, hypertension and CKD stage III.  She was brought to the emergency department 04/24/2021 when she was noted to have a change in mental status by her daughter.  She was confused, had stool incontinence.  Evaluation revealed urinary tract infection based on UA, high fever, acute on chronic renal insufficiency (baseline serum creatinine 1.7).  She was treated with IV fluid resuscitation and broad-spectrum antibiotics, now ceftriaxone.  Blood cultures now positive for gram-negative rods, BC ID shows E. coli.Marland Kitchen  Her blood pressure has stabilized but she continues to have oliguria.  Lactic acid has failed to clear 3.9 > 4.3 > 3.0 > 4.0  Pertinent  Medical History   Past Medical History:  Diagnosis Date   Diabetes mellitus without complication ([place])    Hypertension   CKD stage III   Significant Hospital Events: Including procedures, antibiotic start and stop dates in addition to other pertinent events   Blood cultures 12/23 >> GNR >>  Urine culture 12/23 >>   Interim History / Subjective:  She denies any pain or discomfort RN reports she has been having loose stools, diarrhea 12/24  Objective   Blood pressure (!) 101/55, pulse 80, temperature (!) 101.2 F (38.4 C), temperature source Rectal, resp. rate (!) 26, height _0  (1.626 m), weight 74.8 kg, SpO2 98 %.        Intake/Output Summary (Last 24 hours) at 04/25/2021 1656 Last data filed at 04/25/2021 1530 Gross per 24 hour  Intake 6086.61 ml  Output 5 ml  Net 6081.61 ml   Filed Weights   04/24/21 1807  Weight: 74.8 kg    Examination: General: Pleasant elderly woman laying in bed in no distress. HENT: Alopecia, pupils equal.  Oropharynx clear.  Some intermittent cough  and upper airway noise/stridor Lungs: Overall clear bilaterally, occasional referred upper airway noise Cardiovascular: Regular, distant, no murmur Abdomen: Soft, nondistended with positive bowel sounds Extremities: No significant edema Neuro: She is awake, interacts.  She is oriented to self, place, not the date.  She knows that she is ill and in the hospital.  Moves all extremities.  Follows commands and answers questions GU: Not examined  Resolved Hospital Problem list     Assessment & Plan:   Septic shock based on failure to clear lactic acid with volume resuscitation, source is presumed urinary tract infection with E. coli bacteremia -Agree with moved to stepdown, discussed with Dr. Alfredia Ferguson -Continue to follow lactic acid for clearance -Minimal oxygen requirement, I think it would be reasonable to give her some additional IV fluid resuscitation -could initiate peripheral pressors if pressures drop > would use norepi -Agree with ceftriaxone as ordered -Follow culture data, sensitivities to ensure no evidence of resistant organism -Check chest x-ray  Toxic metabolic encephalopathy due to septic shock, likely also component of her renal dysfunction -Supportive care, treat underlying contributors -Reorientation, delirium precautions -Avoid any sedating medications  Acute on chronic stage IIIa renal failure -No urine output recorded.  Favor placing Foley catheter at least temporarily so we can obtain accurate I/O -Follow BMP -No acute electrolyte abnormalities that would necessitate nephrology consultation -Holding home diuretics for now  Diarrhea -Unclear cause, question related to her underlying infection, possible viral process -Consider check C. difficile if persistent  or if profound leukocytosis -Need to replace volume losses, on maintenance IV fluid  Stridor / wheeze -Intermittent upper airway noise on exam.  She is on room air and has a comfortable respiratory pattern.   We will try to use coolmist to ease upper airway irritation.  She may need cough suppression. -Unclear whether bronchodilators will be of any benefit given the nature of her obstruction but okay to attempt  History of hypertension -Plan to discontinue her diltiazem and hydralazine for now -Her home isosorbide is already on hold -Hold home Lasix  Type 2 diabetes mellitus with hyperglycemia -Hold Amaryl -Sliding scale insulin  Persistent atrial fibrillation -Hold diltiazem -Her home Eliquis is on hold, should build to restart if no procedures planned, no evidence acute bleeding. -Telemetry monitoring   Best Practice (right click and "Reselect all SmartList Selections" daily)   Diet/type: Regular consistency (see orders) DVT prophylaxis: SCD GI prophylaxis: PPI Lines: N/A Foley:  Yes, and it is still needed Code Status:  full code Last date of multidisciplinary goals of care discussion [pending]  Labs   CBC: Recent Labs  Lab 04/24/21 1821 04/25/21 0413  WBC 9.0 19.2*  NEUTROABS 8.1* 14.1*  HGB 10.8* 9.9*  HCT 33.9* 30.9*  MCV 91.9 93.4  PLT 106* 67*    Basic Metabolic Panel: Recent Labs  Lab 04/24/21 1821 04/25/21 0413  NA 138 139  K 3.7 4.4  CL 103 104  CO2 24 23  GLUCOSE 406* 372*  BUN 33* 36*  CREATININE 2.44* 3.08*  CALCIUM 9.2 8.5*   GFR: Estimated Creatinine Clearance: 14.7 mL/min (A) (by C-G formula based on SCr of 3.08 mg/dL (H)). Recent Labs  Lab 04/24/21 1821 04/24/21 1825 04/25/21 0107 04/25/21 0413 04/25/21 0547 04/25/21 1112 04/25/21 1357  WBC 9.0  --   --  19.2*  --   --   --   LATICACIDVEN  --    < > 3.9*  --  4.3* 3.0* 4.0*   < > = values in this interval not displayed.    Liver Function Tests: Recent Labs  Lab 04/24/21 1821 04/25/21 0413  AST 29 63*  ALT 17 31  ALKPHOS 114 62  BILITOT 1.0 1.0  PROT 7.9 6.5  ALBUMIN 3.8 3.0*   No results for input(s): LIPASE, AMYLASE in the last 168 hours. No results for input(s):  AMMONIA in the last 168 hours.  ABG No results found for: PHART, PCO2ART, PO2ART, HCO3, TCO2, ACIDBASEDEF, O2SAT   Coagulation Profile: Recent Labs  Lab 04/24/21 1821  INR 1.3*    Cardiac Enzymes: No results for input(s): CKTOTAL, CKMB, CKMBINDEX, TROPONINI in the last 168 hours.  HbA1C: Hgb A1c MFr Bld  Date/Time Value Ref Range Status  04/25/2021 04:13 AM 9.6 (H) 4.8 - 5.6 % Final    Comment:    (NOTE) Pre diabetes:          5.7%-6.4%  Diabetes:              >6.4%  Glycemic control for   <7.0% adults with diabetes   09/06/2020 06:05 AM 10.4 (H) 4.8 - 5.6 % Final    Comment:    (NOTE) Pre diabetes:          5.7%-6.4%  Diabetes:              >6.4%  Glycemic control for   <7.0% adults with diabetes     CBG: Recent Labs  Lab 04/24/21 1846 04/25/21 0224 04/25/21 0737 04/25/21 1147 04/25/21 1636  GLUCAP 350* [place]    Review of Systems:   Denies any pain She is having diarrhea  Past Medical History:  She,  has a past medical history of Diabetes mellitus without complication ([place]) and Hypertension.   Surgical History:   Past Surgical History:  Procedure Laterality Date   CARDIOVERSION N/A 01/14/2021   Procedure: CARDIOVERSION;  Surgeon: Adrian Prows, MD;  Location: [facility] ENDOSCOPY;  Service: Cardiovascular;  Laterality: N/A;   TONSILLECTOMY       Social History:   reports that she has never smoked. She has never used smokeless tobacco. She reports that she does not drink alcohol and does not use drugs.   Family History:  Her family history is not on file.   Allergies Allergies  Allergen Reactions   Peanut-Containing Drug Products     Stomach pain   Amlodipine Swelling     Home Medications  Prior to Admission medications   Medication Sig Start Date End Date Taking? Authorizing Provider  amLODipine (NORVASC) 10 MG tablet Take 10 mg by mouth daily. 04/08/21  Yes [provider]  diltiazem (CARDIZEM CD) 180 MG 24 hr capsule  Take 1 capsule (180 mg total) by mouth daily. 02/02/21  Yes Cantwell, Celeste C, PA-C  furosemide (LASIX) 20 MG tablet Take 20 mg by mouth 2 (two) times daily.   Yes [provider]  HUMULIN 70/30 KWIKPEN (70-30) 100 UNIT/ML KwikPen Inject 15 Units into the skin at bedtime. 04/11/21  Yes [provider]  hydrALAZINE (APRESOLINE) 50 MG tablet TAKE 1 TABLET(50 MG) BY MOUTH THREE TIMES DAILY Patient taking differently: Take 50 mg by mouth 3 (three) times daily. 11/14/20  Yes Cantwell, Celeste C, PA-C  isosorbide dinitrate (ISORDIL) 30 MG tablet TAKE 1 TABLET(30 MG) BY MOUTH THREE TIMES DAILY Patient taking differently: Take 30 mg by mouth 3 (three) times daily. 04/15/21  Yes Cantwell, Celeste C, PA-C  Menthol-Methyl Salicylate (MUSCLE RUB EX) Apply 1 application topically daily as needed (leg cramps).   Yes [provider]  olmesartan (BENICAR) 20 MG tablet Take 20 mg by mouth daily. 04/11/21  Yes [provider]  pravastatin (PRAVACHOL) 20 MG tablet Take 1 tablet (20 mg total) by mouth daily. Patient taking differently: Take 20 mg by mouth at bedtime. 12/16/20  Yes Cantwell, Celeste C, PA-C  Accu-Chek FastClix Lancets MISC See admin instructions. 01/16/19   [provider]  apixaban (ELIQUIS) 5 MG TABS tablet Take 1 tablet (5 mg total) by mouth 2 (two) times daily. Patient not taking: Reported on 04/25/2021 09/08/20   Jennye Boroughs, MD  BD PEN NEEDLE NANO 2ND GEN 32G X 4 MM MISC 3 (three) times daily. as directed 08/13/19   [provider]  Blood Glucose Monitoring Suppl (ACCU-CHEK GUIDE ME) w/Device KIT See admin instructions. 12/11/18   [provider]  glucose blood test strip TEST BLOOD SUGARS 3 4 TIMES DAILY. 10/27/17   [provider]     Critical care time: 75 min       Baltazar Apo, MD, PhD 04/25/2021, 4:56 PM [facility] [phone] or if no answer before 7:00PM call [phone] For any  issues after 7:00PM please call eLink [phone]

## 2021-04-25 NOTE — Progress Notes (Signed)
The pt was given water this evening and coughed each time she swallowed. The pt was told to put her chin to her chest when swallowing with relief of symptoms but did not continually do so when drinking. This nurse will place an order for a swallow evaluation to assess this patient's needs further.

## 2021-04-25 NOTE — ED Notes (Signed)
Bed alarm places

## 2021-04-25 NOTE — Progress Notes (Addendum)
Received a call from Dr. Jeannene Patella that this pt may have an obstruction AEB little UOP during day shift even with foley placement and several liters of bolus fluids. Will page Dr. Kennon Holter to inform of this recommendation and take pt to CT for scan of abd.

## 2021-04-25 NOTE — Progress Notes (Signed)
PHARMACY - PHYSICIAN COMMUNICATION CRITICAL VALUE ALERT - BLOOD CULTURE IDENTIFICATION (BCID)  [name] is an 80 y.o. female who presented to [facility] on [facility]/24/2021 with a chief complaint of AMS.  Assessment:  1/2 BCx sets growing E coli (suspect urinary source); most common cephalosporin and carbapenem resistance genes not detected  Name of physician (or Provider) Contacted: Sheikh  Current antibiotics: Rocephin 2g IV daily  Changes to prescribed antibiotics recommended: continue same Recommendations accepted by provider  Results for orders placed or performed during the hospital encounter of 04/24/21  Blood Culture ID Panel (Reflexed) (Collected: 04/24/2021  6:21 PM)  Result Value Ref Range   Enterococcus faecalis NOT DETECTED NOT DETECTED   Enterococcus Faecium NOT DETECTED NOT DETECTED   Listeria monocytogenes NOT DETECTED NOT DETECTED   Staphylococcus species NOT DETECTED NOT DETECTED   Staphylococcus aureus (BCID) NOT DETECTED NOT DETECTED   Staphylococcus epidermidis NOT DETECTED NOT DETECTED   Staphylococcus lugdunensis NOT DETECTED NOT DETECTED   Streptococcus species NOT DETECTED NOT DETECTED   Streptococcus agalactiae NOT DETECTED NOT DETECTED   Streptococcus pneumoniae NOT DETECTED NOT DETECTED   Streptococcus pyogenes NOT DETECTED NOT DETECTED   A.calcoaceticus-baumannii NOT DETECTED NOT DETECTED   Bacteroides fragilis NOT DETECTED NOT DETECTED   Enterobacterales DETECTED (A) NOT DETECTED   Enterobacter cloacae complex NOT DETECTED NOT DETECTED   Escherichia coli DETECTED (A) NOT DETECTED   Klebsiella aerogenes NOT DETECTED NOT DETECTED   Klebsiella oxytoca NOT DETECTED NOT DETECTED   Klebsiella pneumoniae NOT DETECTED NOT DETECTED   Proteus species NOT DETECTED NOT DETECTED   Salmonella species NOT DETECTED NOT DETECTED   Serratia marcescens NOT DETECTED NOT DETECTED   Haemophilus influenzae NOT DETECTED NOT DETECTED   Neisseria meningitidis NOT DETECTED  NOT DETECTED   Pseudomonas aeruginosa NOT DETECTED NOT DETECTED   Stenotrophomonas maltophilia NOT DETECTED NOT DETECTED   Candida albicans NOT DETECTED NOT DETECTED   Candida auris NOT DETECTED NOT DETECTED   Candida glabrata NOT DETECTED NOT DETECTED   Candida krusei NOT DETECTED NOT DETECTED   Candida parapsilosis NOT DETECTED NOT DETECTED   Candida tropicalis NOT DETECTED NOT DETECTED   Cryptococcus neoformans/gattii NOT DETECTED NOT DETECTED   CTX-M ESBL NOT DETECTED NOT DETECTED   Carbapenem resistance IMP NOT DETECTED NOT DETECTED   Carbapenem resistance KPC NOT DETECTED NOT DETECTED   Carbapenem resistance NDM NOT DETECTED NOT DETECTED   Carbapenem resist OXA 48 LIKE NOT DETECTED NOT DETECTED   Carbapenem resistance VIM NOT DETECTED NOT DETECTED    [name] A 04/25/2021  10:14 AM

## 2021-04-25 NOTE — ED Notes (Signed)
Dr. Alfredia Ferguson made aware of the lactic acid levels

## 2021-04-26 DIAGNOSIS — N189 Chronic kidney disease, unspecified: Secondary | ICD-10-CM

## 2021-04-26 DIAGNOSIS — N3000 Acute cystitis without hematuria: Secondary | ICD-10-CM | POA: Diagnosis not present

## 2021-04-26 DIAGNOSIS — A4151 Sepsis due to Escherichia coli [E. coli]: Principal | ICD-10-CM

## 2021-04-26 DIAGNOSIS — N179 Acute kidney failure, unspecified: Secondary | ICD-10-CM | POA: Diagnosis not present

## 2021-04-26 DIAGNOSIS — R7881 Bacteremia: Secondary | ICD-10-CM | POA: Diagnosis not present

## 2021-04-26 DIAGNOSIS — R410 Disorientation, unspecified: Secondary | ICD-10-CM

## 2021-04-26 DIAGNOSIS — R062 Wheezing: Secondary | ICD-10-CM

## 2021-04-26 LAB — CBC WITH DIFFERENTIAL/PLATELET
Abs Immature Granulocytes: 1.66 10*3/uL — ABNORMAL HIGH (ref 0.00–0.07)
Basophils Absolute: 0.1 10*3/uL (ref 0.0–0.1)
Basophils Relative: 1 %
Eosinophils Absolute: 1.4 10*3/uL — ABNORMAL HIGH (ref 0.0–0.5)
Eosinophils Relative: 8 %
HCT: 29.1 % — ABNORMAL LOW (ref 36.0–46.0)
Hemoglobin: 9.5 g/dL — ABNORMAL LOW (ref 12.0–15.0)
Immature Granulocytes: 10 %
Lymphocytes Relative: 5 %
Lymphs Abs: 0.8 10*3/uL (ref 0.7–4.0)
MCH: 29.2 pg (ref 26.0–34.0)
MCHC: 32.6 g/dL (ref 30.0–36.0)
MCV: 89.5 fL (ref 80.0–100.0)
Monocytes Absolute: 1.2 10*3/uL — ABNORMAL HIGH (ref 0.1–1.0)
Monocytes Relative: 7 %
Neutro Abs: 12 10*3/uL — ABNORMAL HIGH (ref 1.7–7.7)
Neutrophils Relative %: 69 %
Platelets: 66 10*3/uL — ABNORMAL LOW (ref 150–400)
RBC: 3.25 MIL/uL — ABNORMAL LOW (ref 3.87–5.11)
RDW: 14.4 % (ref 11.5–15.5)
WBC: 17.1 10*3/uL — ABNORMAL HIGH (ref 4.0–10.5)
nRBC: 0 % (ref 0.0–0.2)

## 2021-04-26 LAB — COMPREHENSIVE METABOLIC PANEL
ALT: 49 U/L — ABNORMAL HIGH (ref 0–44)
AST: 106 U/L — ABNORMAL HIGH (ref 15–41)
Albumin: 3 g/dL — ABNORMAL LOW (ref 3.5–5.0)
Alkaline Phosphatase: 63 U/L (ref 38–126)
Anion gap: 10 (ref 5–15)
BUN: 51 mg/dL — ABNORMAL HIGH (ref 8–23)
CO2: 22 mmol/L (ref 22–32)
Calcium: 7.8 mg/dL — ABNORMAL LOW (ref 8.9–10.3)
Chloride: 107 mmol/L (ref 98–111)
Creatinine, Ser: 3.69 mg/dL — ABNORMAL HIGH (ref 0.44–1.00)
GFR, Estimated: 12 mL/min — ABNORMAL LOW (ref >60)
Glucose, Bld: 132 mg/dL — ABNORMAL HIGH (ref 70–99)
Potassium: 3.7 mmol/L (ref 3.5–5.1)
Sodium: 139 mmol/L (ref 135–145)
Total Bilirubin: 1 mg/dL (ref 0.3–1.2)
Total Protein: 6.5 g/dL (ref 6.5–8.1)

## 2021-04-26 LAB — MAGNESIUM: Magnesium: 1.8 mg/dL (ref 1.7–2.4)

## 2021-04-26 LAB — C DIFFICILE QUICK SCREEN W PCR REFLEX
C Diff antigen: NEGATIVE
C Diff interpretation: NOT DETECTED
C Diff toxin: NEGATIVE

## 2021-04-26 LAB — GLUCOSE, CAPILLARY
Glucose-Capillary: 128 mg/dL — ABNORMAL HIGH (ref 70–99)
Glucose-Capillary: 158 mg/dL — ABNORMAL HIGH (ref 70–99)
Glucose-Capillary: 137 mg/dL — ABNORMAL HIGH (ref 70–99)
Glucose-Capillary: 94 mg/dL (ref 70–99)

## 2021-04-26 LAB — PHOSPHORUS: Phosphorus: 3.8 mg/dL (ref 2.5–4.6)

## 2021-04-26 MED ORDER — OXYCODONE HCL 5 MG PO TABS
5.0000 mg | ORAL_TABLET | Freq: Four times a day (QID) | ORAL | Status: DC | PRN
  Administered 2021-04-26 – 2021-04-27 (×2): 5 mg via ORAL
  Filled 2021-04-26 (×2): qty 1

## 2021-04-26 MED ORDER — DICYCLOMINE HCL 10 MG PO CAPS
10.0000 mg | ORAL_CAPSULE | Freq: Three times a day (TID) | ORAL | Status: DC
  Administered 2021-04-26 – 2021-05-02 (×21): 10 mg via ORAL
  Filled 2021-04-26 (×27): qty 1

## 2021-04-26 MED ORDER — LACTATED RINGERS IV SOLN: INTRAVENOUS | Status: AC

## 2021-04-26 MED ORDER — FENTANYL CITRATE (PF) 100 MCG/2ML IJ SOLN
12.5000 ug | INTRAMUSCULAR | Status: DC | PRN
  Administered 2021-04-26: 15:00:00 12.5 ug via INTRAVENOUS
  Filled 2021-04-26: qty 2

## 2021-04-26 MED ORDER — MAGNESIUM SULFATE 2 GM/50ML IV SOLN
2.0000 g | Freq: Once | INTRAVENOUS | Status: AC
  Administered 2021-04-26: 06:00:00 2 g via INTRAVENOUS
  Filled 2021-04-26: qty 50

## 2021-04-26 MED ORDER — LACTATED RINGERS IV BOLUS
500.0000 mL | Freq: Once | INTRAVENOUS | Status: AC
  Administered 2021-04-26: 05:00:00 500 mL via INTRAVENOUS

## 2021-04-26 NOTE — Evaluation (Signed)
[facility]P Cancellation Note  Patient Details Name: [name] MRN: [id number] DOB: 1940/07/03   Cancelled treatment:       Reason Eval/Treat Not Completed: Other (comment);Medical issues which prohibited therapy (SLP messaged RN re: pt's swallow evaluation.  At this time, RN reported that pt's stomach is cramping and she doubt s pt wants to eat or drink anything now.  Will follow up in am for swallow evaluation.   If concern is present for aspiration, rec npo x medications. )  Kathleen Lime, MS [facility] SLP Acute Rehab Services Office [phone] Pager [phone]   Macario Golds 04/26/2021, 4:06 PM

## 2021-04-26 NOTE — Progress Notes (Signed)
PROGRESS NOTE    [name]  ONG:[id number] DOB: 07-28-1940 DOA: 04/24/2021 PCP: Trey Sailors, PA   Brief Narrative:  The patient is a 80 year old African-American female with past medical history significant for but not limited to diabetes mellitus type 2, hypertension, CKD stage IIIb as well as other comorbidities who presented to the ED with a chief complaint of altered mental status.  Patient went to her PCP appointment for regular checkup she complained to her PCP that she felt cold.  Patient's daughter states that there is no blood work done that day and then she went to bed the evening.  She did not eat dinner and woke up the next morning and is now to be incontinent of stool and feces.  She had feces running down her leg and patient was significantly confused.  Patient was noted to be febrile and EMS was called and she was brought to the ED and initial lactate level was 3.1 and UA was positive for nitrites, leuk esterase and pyuria.  She had an elevated BUN/creatinine from her baseline of 1.7 and chest x-ray was negative for any acute infiltrates.  She is given 4.25 L of IV fluid boluses and placed on maintenance IV fluid hydration and blood and urine cultures obtained.  She is started on IV Rocephin and then further work-up revealed that she got bacteremia.  Given that she consistently had elevated lactic acid level she was moved to stepdown unit and critical care was consulted.  She is improving from a mentation standpoint but her renal function continue to worsen and has had very little output and has been oliguric.  Her diarrhea is improving and C. difficile testing negative.  We will consult nephrology for further evaluation recommendations and critical care does not feel that she will require any pressors at this time  Assessment & Plan:   Principal Problem:   Sepsis with acute organ dysfunction without septic shock [facility]) Active Problems:   Essential hypertension   Persistent  atrial fibrillation (HCC)   DM (diabetes mellitus), type 2 with renal complications ([place])   Acute kidney injury superimposed on chronic kidney disease ([place])   Acute cystitis without hematuria   Bacteremia  Currently she is being admitted for:  Septic shock with acute organ dysfunction with acute on chronic kidney disease in the setting of E. coli bacteremia and E. coli UTI -She is admitted to a medical telemetry bed with this was changed to the stepdown unit -Initially given 4.25 L of IV fluid resuscitation and will give her another liter today and start on maintenance IV fluid hydration -Blood cultures x2 showed 1 out of 2 with E. coli and urine culture consistent with UTI with sensitivities showing greater than 100,000 colony forming units of gram-negative rods -Continue with empiric ceftriaxone -Continue to monitor temperature curve and WBC count -Because her lactate remain consistently elevated critical care was consulted and recommended another liter bolus -WBC worsened and went from 9.0 is now 19.9 yesterday and is now trending down to 17.1 -If necessary she will be placed on peripheral Levophed  Acute toxic metabolic encephalopathy in the setting of infection as above, improving -Continue with supporting care and treat underlying causes -Avoid sedating medications if possible and continue reorientation and placed on delirium precautions  Acute cystitis with Hematuria -As above follow urine cultures and continue IV ceftriaxone  AKI on superimposed on chronic kidney disease stage IIIb -Baseline creatinine is around 1.3-1.6 earlier this year -She was given 4.25 L of  normal saline boluses in the ED and an additional 1 L centimeters and another 1 by critical care. -Currently getting IV fluid hydration at normal saline 75 MLS per hour -Strict I's and O's and daily weights; she is +840 mL since admission but her total output has been only 210 for last day or so -Avoid nephrotoxic  medications, contrast dyes, hypotension dehydration and renally dose medications -Continue to monitor and trend BUN/creatinine and is gone from 33/2.44 -> 36/3.08 -> 47/3.53 -> 48/3.50 ->  51/3.69 -Foley catheter is in place -CT Scan Abd/Pelvis done and showed "No evidence of obstructive ureteral or bladder calculi. No hydronephrosis. Small right greater than left pleural effusions with small volume abdominopelvic ascites and body wall edema. Hepatomegaly. Cholelithiasis without findings of acute cholecystitis.  Aortic Atherosclerosis."  -Repeat CMP in a.m.  Essential Hypertension -Holding her Lasix and continue monitor blood pressures is on the lower side -Continue monitor blood pressures per protocol and she has been moved to the stepdown unit for closer blood pressure monitoring -Blood pressure was  Abnormal LFTs -AST trended up and is now 106 and ALT is now 40 -CT scan of the abdomen pelvis showed cholelithiasis without findings of acute cholecystitis -Likely reactive in the setting of septic shock -Continue to monitor and trend LFTs closely and if necessary will obtain acute hepatitis panel in the morning -Repeat CMP in a.m.  Normocytic anemia/anemia of chronic kidney disease -Patient is immobile/hematocrit is now at 9.5/29.1 -Check anemia panel in the a.m. -Continue to monitor for signs and symptoms of bleeding; currently no overt bleeding -Repeat CBC in a.m.  Diabetes mellitus type 2 with renal complications -Continue sliding scale insulin hold her Amaryl -CBGs ranging from 128-158  Diarrhea -Could be related to underlying infection versus a viral process -She is getting IV fluid maintenance and C. difficile may be checked if she continues to worsen -C. difficile was negative and CT of the abdomen pelvis was relatively reassuring  Paroxysmal atrial fibrillation -She is in normal sinus rhythm -She is not on anticoagulation as her anticoagulation is only given to her when she  was cardioverted  Thrombocytopenia -In the setting of infection as above and sepsis -Platelet count from 106 -> 67 is now 66 -Continue to monitor for signs and symptoms bleeding; currently no overt bleeding noted -Repeat CBC in a.m.  DVT prophylaxis: SCDs: Her apixaban has not been resumed but will hold for now and consider heparin drip if intervention is needed but upon further review she is no longer taking apixaban Code Status: FULL CODE Family Communication: No family currently at bedside Disposition Plan: Pending further clinical improvement  Status is: Inpatient  Remains inpatient appropriate because: Remains confused and is sick enough to be in the stepdown unit  Consultants:  PCCM Have asked nephrology for further assistance and discussed the case with Dr. Candiss Norse  Procedures: None   Antimicrobials:  Anti-infectives (From admission, onward)    Start     Dose/Rate Route Frequency Ordered Stop   04/25/21 1800  cefTRIAXone (ROCEPHIN) 2 g in sodium chloride 0.9 % 100 mL IVPB        2 g 200 mL/hr over 30 Minutes Intravenous Every 24 hours 04/25/21 1012 05/01/21 1759   04/24/21 2030  cefTRIAXone (ROCEPHIN) 2 g in sodium chloride 0.9 % 100 mL IVPB  Status:  Discontinued        2 g 200 mL/hr over 30 Minutes Intravenous Every 24 hours 04/24/21 2017 04/25/21 1012  Subjective: Seen and examined at bedside he was little bit more awake and alert but still a little confused and disoriented.  No chest pain or shortness of breath.  Diarrhea is improving.  Still not urinating as much.  Feels okay.  No other concerns or complaints at this time.  Objective: Vitals:   04/26/21 1000 04/26/21 1100 04/26/21 1200 04/26/21 1201  BP: (!) 120/53 (!) 132/57 121/60   Pulse: 77 73 81   Resp: 16 17 18    Temp:    99.4 F (37.4 C)  TempSrc:    Axillary  SpO2: 100% 100% 100%   Weight:      Height:        Intake/Output Summary (Last 24 hours) at 04/26/2021 1324 Last data filed at  04/26/2021 1206 Gross per 24 hour  Intake 2005.56 ml  Output 210 ml  Net 1795.56 ml    Filed Weights   04/24/21 1807 04/25/21 1655 04/26/21 0440  Weight: 74.8 kg 72.9 kg 71.7 kg   Examination: Physical Exam:  Constitutional: WN/WD overweight chronically ill-appearing African-American female currently in no acute distress appears calm and still slightly confused Eyes: Lids and conjunctivae normal, sclerae anicteric  ENMT: External Ears, Nose appear normal. Grossly normal hearing.  Neck: Appears normal, supple, no cervical masses, normal ROM, no appreciable thyromegaly, no appreciable JVD Respiratory: Diminished to auscultation bilaterally with some mild expiratory wheezing, no wheezing, rales, rhonchi or crackles. Normal respiratory effort and patient is not tachypenic. No accessory muscle use.  Unlabored breathing and not wearing his supplemental oxygen nasal cannula Cardiovascular: RRR, no murmurs / rubs / gallops. S1 and S2 auscultated.  Minimal extremity edema Abdomen: Soft, non-tender, slightly distended secondary body habitus.Bowel sounds positive.  GU: Deferred.  Foley catheter in place Musculoskeletal: No clubbing / cyanosis of digits/nails. No joint deformity upper and lower extremities.  Skin: No rashes, lesions, ulcers on limited skin evaluation. No induration; Warm and dry.  Neurologic: CN 2-12 grossly intact with no focal deficits.  Romberg sign and cerebellar reflexes not assessed.  Psychiatric: Impaired judgment and insight. Alert and awake and alert and oriented x2.  Remains a little confused and disoriented  Data Reviewed: I have personally reviewed following labs and imaging studies  CBC: Recent Labs  Lab 04/24/21 1821 04/25/21 0413 04/26/21 0303  WBC 9.0 19.2* 17.1*  NEUTROABS 8.1* 14.1* 12.0*  HGB 10.8* 9.9* 9.5*  HCT 33.9* 30.9* 29.1*  MCV 91.9 93.4 89.5  PLT 106* 67* 66*    Basic Metabolic Panel: Recent Labs  Lab 04/24/21 1821 04/25/21 0413  04/25/21 1835 04/25/21 2058 04/26/21 0303  NA 138 139 139 138 139  K 3.7 4.4 3.7 4.1 3.7  CL 103 104 105 105 107  CO2 24 23 22  21* 22  GLUCOSE 406* 372* 176* 159* 132*  BUN 33* 36* 47* 48* 51*  CREATININE 2.44* 3.08* 3.53* 3.50* 3.69*  CALCIUM 9.2 8.5* 7.7* 7.8* 7.8*  MG  --   --   --   --  1.8  PHOS  --   --   --   --  3.8    GFR: Estimated Creatinine Clearance: 12 mL/min (A) (by C-G formula based on SCr of 3.69 mg/dL (H)). Liver Function Tests: Recent Labs  Lab 04/24/21 1821 04/25/21 0413 04/26/21 0303  AST 29 63* 106*  ALT 17 31 49*  ALKPHOS 114 62 63  BILITOT 1.0 1.0 1.0  PROT 7.9 6.5 6.5  ALBUMIN 3.8 3.0* 3.0*    No results for  input(s): LIPASE, AMYLASE in the last 168 hours. No results for input(s): AMMONIA in the last 168 hours. Coagulation Profile: Recent Labs  Lab 04/24/21 1821  INR 1.3*    Cardiac Enzymes: No results for input(s): CKTOTAL, CKMB, CKMBINDEX, TROPONINI in the last 168 hours. BNP (last 3 results) No results for input(s): PROBNP in the last 8760 hours. HbA1C: Recent Labs    04/25/21 0413  HGBA1C 9.6*    CBG: Recent Labs  Lab 04/25/21 1147 04/25/21 1636 04/25/21 2112 04/26/21 0735 04/26/21 1141  GLUCAP 212* 152* 142* 128* 158*    Lipid Profile: No results for input(s): CHOL, HDL, LDLCALC, TRIG, CHOLHDL, LDLDIRECT in the last 72 hours. Thyroid Function Tests: No results for input(s): TSH, T4TOTAL, FREET4, T3FREE, THYROIDAB in the last 72 hours. Anemia Panel: No results for input(s): VITAMINB12, FOLATE, FERRITIN, TIBC, IRON, RETICCTPCT in the last 72 hours. Sepsis Labs: Recent Labs  Lab 04/25/21 1112 04/25/21 1357 04/25/21 1835 04/25/21 2058  LATICACIDVEN 3.0* 4.0* 3.4* 3.3*     Recent Results (from the past 240 hour(s))  Blood Culture (routine x 2)     Status: Abnormal (Preliminary result)   Collection Time: 04/24/21  6:21 PM   Specimen: BLOOD  Result Value Ref Range Status   Specimen Description   Final     BLOOD LEFT ANTECUBITAL Performed at [facility] [address] [place], [place] [address]    Special Requests   Final    BOTTLES DRAWN AEROBIC AND ANAEROBIC Blood Culture results may not be optimal due to an excessive volume of blood received in culture bottles Performed at [facility] [address], [place], [place] [address]    Culture  Setup Time   Final    GRAM NEGATIVE RODS IN BOTH AEROBIC AND ANAEROBIC BOTTLES CRITICAL RESULT CALLED TO, READ BACK BY AND VERIFIED WITH: D,WOFFORD PHARMD @1005  04/25/21 EB    Culture (A)  Final    ESCHERICHIA COLI SUSCEPTIBILITIES TO FOLLOW Performed at [facility], [address] [place], [place] [address]    Report Status PENDING  Incomplete  Blood Culture ID Panel (Reflexed)     Status: Abnormal   Collection Time: 04/24/21  6:21 PM  Result Value Ref Range Status   Enterococcus faecalis NOT DETECTED NOT DETECTED Final   Enterococcus Faecium NOT DETECTED NOT DETECTED Final   Listeria monocytogenes NOT DETECTED NOT DETECTED Final   Staphylococcus species NOT DETECTED NOT DETECTED Final   Staphylococcus aureus (BCID) NOT DETECTED NOT DETECTED Final   Staphylococcus epidermidis NOT DETECTED NOT DETECTED Final   Staphylococcus lugdunensis NOT DETECTED NOT DETECTED Final   Streptococcus species NOT DETECTED NOT DETECTED Final   Streptococcus agalactiae NOT DETECTED NOT DETECTED Final   Streptococcus pneumoniae NOT DETECTED NOT DETECTED Final   Streptococcus pyogenes NOT DETECTED NOT DETECTED Final   A.calcoaceticus-baumannii NOT DETECTED NOT DETECTED Final   Bacteroides fragilis NOT DETECTED NOT DETECTED Final   Enterobacterales DETECTED (A) NOT DETECTED Final    Comment: Enterobacterales represent a large order of gram negative bacteria, not a single organism. CRITICAL RESULT CALLED TO, READ BACK BY AND VERIFIED WITH: D,WOFFORD PHARMD @1005  04/25/21 EB    Enterobacter cloacae complex NOT  DETECTED NOT DETECTED Final   Escherichia coli DETECTED (A) NOT DETECTED Final    Comment: CRITICAL RESULT CALLED TO, READ BACK BY AND VERIFIED WITH: D,WOFFORD PHARMD @1005  04/25/21 EB    Klebsiella aerogenes NOT DETECTED NOT DETECTED Final   Klebsiella oxytoca NOT DETECTED  NOT DETECTED Final   Klebsiella pneumoniae NOT DETECTED NOT DETECTED Final   Proteus species NOT DETECTED NOT DETECTED Final   Salmonella species NOT DETECTED NOT DETECTED Final   Serratia marcescens NOT DETECTED NOT DETECTED Final   Haemophilus influenzae NOT DETECTED NOT DETECTED Final   Neisseria meningitidis NOT DETECTED NOT DETECTED Final   Pseudomonas aeruginosa NOT DETECTED NOT DETECTED Final   Stenotrophomonas maltophilia NOT DETECTED NOT DETECTED Final   Candida albicans NOT DETECTED NOT DETECTED Final   Candida auris NOT DETECTED NOT DETECTED Final   Candida glabrata NOT DETECTED NOT DETECTED Final   Candida krusei NOT DETECTED NOT DETECTED Final   Candida parapsilosis NOT DETECTED NOT DETECTED Final   Candida tropicalis NOT DETECTED NOT DETECTED Final   Cryptococcus neoformans/gattii NOT DETECTED NOT DETECTED Final   CTX-M ESBL NOT DETECTED NOT DETECTED Final   Carbapenem resistance IMP NOT DETECTED NOT DETECTED Final   Carbapenem resistance KPC NOT DETECTED NOT DETECTED Final   Carbapenem resistance NDM NOT DETECTED NOT DETECTED Final   Carbapenem resist OXA 48 LIKE NOT DETECTED NOT DETECTED Final   Carbapenem resistance VIM NOT DETECTED NOT DETECTED Final    Comment: Performed at [facility], [address] [place], [place] [address]  Resp Panel by RT-PCR (Flu A&B, Covid) Nasopharyngeal Swab     Status: None   Collection Time: 04/24/21  6:24 PM   Specimen: Nasopharyngeal Swab; Nasopharyngeal(NP) swabs in vial transport medium  Result Value Ref Range Status   SARS Coronavirus 2 by RT PCR NEGATIVE NEGATIVE Final    Comment: (NOTE) SARS-CoV-2 target nucleic acids are NOT DETECTED.  The  SARS-CoV-2 RNA is generally detectable in upper respiratory specimens during the acute phase of infection. The lowest concentration of SARS-CoV-2 viral copies this assay can detect is 138 copies/mL. A negative result does not preclude SARS-Cov-2 infection and should not be used as the sole basis for treatment or other patient management decisions. A negative result may occur with  improper specimen collection/handling, submission of specimen other than nasopharyngeal swab, presence of viral mutation(s) within the areas targeted by this assay, and inadequate number of viral copies(<138 copies/mL). A negative result must be combined with clinical observations, patient history, and epidemiological information. The expected result is Negative.  Fact Sheet for Patients:  EntrepreneurPulse.com.au  Fact Sheet for Healthcare Providers:  IncredibleEmployment.be  This test is no t yet approved or cleared by the [place] FDA and  has been authorized for detection and/or diagnosis of SARS-CoV-2 by FDA under an Emergency Use Authorization (EUA). This EUA will remain  in effect (meaning this test can be used) for the duration of the COVID-19 declaration under Section 564(b)(1) of the Act, 21 U.S.C.section 360bbb-3(b)(1), unless the authorization is terminated  or revoked sooner.       Influenza A by PCR NEGATIVE NEGATIVE Final   Influenza B by PCR NEGATIVE NEGATIVE Final    Comment: (NOTE) The Xpert Xpress SARS-CoV-2/FLU/RSV plus assay is intended as an aid in the diagnosis of influenza from Nasopharyngeal swab specimens and should not be used as a sole basis for treatment. Nasal washings and aspirates are unacceptable for Xpert Xpress SARS-CoV-2/FLU/RSV testing.  Fact Sheet for Patients: EntrepreneurPulse.com.au  Fact Sheet for Healthcare Providers: IncredibleEmployment.be  This test is not yet approved or  cleared by the [place] FDA and has been authorized for detection and/or diagnosis of SARS-CoV-2 by FDA under an Emergency Use Authorization (EUA). This EUA will remain in effect (meaning this  test can be used) for the duration of the COVID-19 declaration under Section 564(b)(1) of the Act, 21 U.S.C. section 360bbb-3(b)(1), unless the authorization is terminated or revoked.  Performed at [facility], [place] [address], [place], [place] [address]   Urine Culture     Status: Abnormal (Preliminary result)   Collection Time: 04/24/21  6:35 PM   Specimen: In/Out Cath Urine  Result Value Ref Range Status   Specimen Description   Final    IN/OUT CATH URINE Performed at [facility] [address] [place], [place] [address]    Special Requests   Final    NONE Performed at [facility], [place] [address] [place], [place] [address]    Culture (A)  Final    >=100,000 COLONIES/mL ESCHERICHIA COLI SUSCEPTIBILITIES TO FOLLOW Performed at [facility], [place] [address], [place], [place] [address]    Report Status PENDING  Incomplete  Blood Culture (routine x 2)     Status: None (Preliminary result)   Collection Time: 04/24/21  8:31 PM   Specimen: BLOOD  Result Value Ref Range Status   Specimen Description   Final    BLOOD Performed at [facility] [address] [place], [place] [address]    Special Requests   Final    BOTTLES DRAWN AEROBIC AND ANAEROBIC Blood Culture adequate volume Performed at [facility] [address], [place], [place] [address]    Culture  Setup Time   Final    GRAM NEGATIVE RODS IN BOTH AEROBIC AND ANAEROBIC BOTTLES CRITICAL VALUE NOTED.  VALUE IS CONSISTENT WITH PREVIOUSLY REPORTED AND CALLED VALUE.    Culture   Final    GRAM NEGATIVE RODS IDENTIFICATION TO FOLLOW Performed at [facility], [address], [place], [place] [address]    Report  Status PENDING  Incomplete  MRSA Next Gen by PCR, Nasal     Status: None   Collection Time: 04/25/21  4:50 PM   Specimen: Nasal Mucosa; Nasal Swab  Result Value Ref Range Status   MRSA by PCR Next Gen NOT DETECTED NOT DETECTED Final    Comment: (NOTE) The GeneXpert MRSA Assay (FDA approved for NASAL specimens only), is one component of a comprehensive MRSA colonization surveillance program. It is not intended to diagnose MRSA infection nor to guide or monitor treatment for MRSA infections. Test performance is not FDA approved in patients less than 19 years old. Performed at [facility], [place] [address], [place], [place] [address]   C Difficile Quick Screen w PCR reflex     Status: None   Collection Time: 04/26/21  4:58 AM   Specimen: STOOL  Result Value Ref Range Status   C Diff antigen NEGATIVE NEGATIVE Final   C Diff toxin NEGATIVE NEGATIVE Final   C Diff interpretation No C. difficile detected.  Final    Comment: Performed at [facility], [place] [address], [place],  [address]     RN Pressure Injury Documentation:     Estimated body mass index is 27.13 kg/m as calculated from the following:   Height as of this encounter: 5\' 4"  (1.626 m).   Weight as of this encounter: 71.7 kg.  Malnutrition Type:   Malnutrition Characteristics:   Nutrition Interventions:    Radiology Studies: CT ABDOMEN PELVIS WO CONTRAST  Result Date: 04/25/2021 CLINICAL DATA:  AK I, history of renal stone. EXAM: CT ABDOMEN AND PELVIS WITHOUT  CONTRAST TECHNIQUE: Multidetector CT imaging of the abdomen and pelvis was performed following the standard protocol without IV contrast. COMPARISON:  CT January 16, 2021 FINDINGS: Lower chest: Small right greater than left pleural effusions. Calcified granuloma in the right lower lobe. Hepatobiliary: Hepatomegaly measuring 20.6 cm in maximum craniocaudal dimension. No hepatic lesion visualized on this noncontrast  examination. Periportal edema, possibly related to fluid resuscitation. Cholelithiasis without findings of acute cholecystitis. No biliary ductal dilation. Pancreas: No pancreatic ductal dilation or evidence of acute inflammation. Spleen: Within normal limits. Adrenals/Urinary Tract: Similar left adrenal thickening. Right adrenal gland appears normal. No hydronephrosis. No nephrolithiasis. No obstructive ureteral or bladder calculi identified. Calcifications along the bilateral ureters appear to be associated with the gonadal veins likely reflecting phleboliths. Urinary bladder is decompressed around a Foley catheter. Stomach/Bowel: No enteric contrast was administered. No evidence of bowel obstruction. No evidence of acute bowel wall thickening or inflammatory changes. Vascular/Lymphatic: Aortic and branch vessel atherosclerosis without abdominal aortic aneurysm. No pathologically enlarged abdominal or pelvic lymph nodes. Reproductive: Calcified uterine leiomyomas. No suspicious adnexal mass identified. Other: Small volume abdominopelvic ascites, similar prior. Diffuse body wall edema. Musculoskeletal: Similar mild compression deformity of the L2 superior endplate. No acute osseous abnormality. IMPRESSION: 1. No evidence of obstructive ureteral or bladder calculi. No hydronephrosis. 2. Small right greater than left pleural effusions with small volume abdominopelvic ascites and body wall edema. 3. Hepatomegaly. 4. Cholelithiasis without findings of acute cholecystitis. 5.  Aortic Atherosclerosis (ICD10-I70.0). Electronically Signed   By: Dahlia Bailiff M.D.   On: 04/25/2021 22:07   DG CHEST PORT 1 VIEW  Result Date: 04/25/2021 CLINICAL DATA:  Wheezing EXAM: PORTABLE CHEST 1 VIEW COMPARISON:  April 24, 2021 FINDINGS: The cardiomediastinal silhouette is unchanged in contour.Atherosclerotic calcifications. No pleural effusion. No pneumothorax. No acute pleuroparenchymal abnormality. Visualized abdomen is  unremarkable. IMPRESSION: No acute cardiopulmonary abnormality. Electronically Signed   By: Valentino Saxon M.D.   On: 04/25/2021 17:57   DG Chest Port 1 View  Result Date: 04/24/2021 CLINICAL DATA:  Possible sepsis EXAM: PORTABLE CHEST 1 VIEW COMPARISON:  09/06/2020 FINDINGS: Cardiac shadow is mildly prominent but stable. Calcified granulomas are noted bilaterally. No focal infiltrate or effusion is seen. No bony abnormality is noted. IMPRESSION: No active disease. Electronically Signed   By: Inez Catalina M.D.   On: 04/24/2021 19:00    Scheduled Meds:  Chlorhexidine Gluconate Cloth  6 each Topical Q0600   insulin aspart  0-15 Units Subcutaneous TID WC   insulin aspart  0-5 Units Subcutaneous QHS   mouth rinse  15 mL Mouth Rinse BID   pantoprazole (PROTONIX) IV  40 mg Intravenous QHS   Continuous Infusions:  cefTRIAXone (ROCEPHIN)  IV Stopped (04/25/21 1827)    LOS: 2 days   Kerney Elbe, DO [facility] PAGER is on AMION  If 7PM-7AM, please contact night-coverage [email or web address]

## 2021-04-26 NOTE — Progress Notes (Signed)
NAME:  [name], MRN:  [id number], DOB:  August 23, 1940, LOS: 2 ADMISSION DATE:  04/24/2021, CONSULTATION DATE:  04/25/21 REFERRING MD:  Dr Alfredia Ferguson, CHIEF COMPLAINT:  septic shock   History of Present Illness:  80 year old woman with history of type 2 diabetes, hypertension and CKD stage III.  She was brought to the emergency department 04/24/2021 when she was noted to have a change in mental status by her daughter.  She was confused, had stool incontinence.  Evaluation revealed urinary tract infection based on UA, high fever, acute on chronic renal insufficiency (baseline serum creatinine 1.7).  She was treated with IV fluid resuscitation and broad-spectrum antibiotics, now ceftriaxone.  Blood cultures now positive for gram-negative rods, BC ID shows E. coli.Marland Kitchen  Her blood pressure has stabilized but she continues to have oliguria.  Lactic acid has failed to clear 3.9 > 4.3 > 3.0 > 4.0  Pertinent  Medical History   Past Medical History:  Diagnosis Date   Diabetes mellitus without complication ([place])    Hypertension   CKD stage III   Significant Hospital Events: Including procedures, antibiotic start and stop dates in addition to other pertinent events   Blood cultures 12/23 >> GNR >> BCID E. coli Urine culture 12/23 >> 100k cfu GNR >>  C diff 12/25 >> negative CT abdomen pelvis 12/24 > no evidence of ureteral or cystic calculi.  No hydronephrosis.  Small bilateral pleural effusions.  Hepatomegaly.  Cholelithiasis without any evidence for acute cholecystitis  Interim History / Subjective:  Hemodynamically stable Room air Oliguric, urine output last 24 hours 200 cc Evolving renal failure Lactic acid 3.4 > 3.3 No pain C. difficile negative as above  Objective   Blood pressure (!) 132/59, pulse 80, temperature 98.3 F (36.8 C), temperature source Oral, resp. rate 19, height 5\' 4"  (1.626 m), weight 71.7 kg, SpO2 100 %.        Intake/Output Summary (Last 24 hours) at 04/26/2021  0833 Last data filed at 04/26/2021 6384 Gross per 24 hour  Intake 2507.19 ml  Output 210 ml  Net 2297.19 ml   Filed Weights   04/24/21 1807 04/25/21 1655 04/26/21 0440  Weight: 74.8 kg 72.9 kg 71.7 kg    Examination: General: Pleasant woman, no distress HENT: Some alopecia, pupils equal and oropharynx clear.  No upper airway noise.  Stronger voice today Lungs: Decreased to both bases but otherwise clear.  No referred upper airway noise Cardiovascular: Regular, distant, no murmur Abdomen: Nondistended, positive bowel sounds Extremities: No edema Neuro: Awake, interacts.  Better oriented and stronger overall.  Moves all extremities.  Answers questions and follows commands.  No dysarthria or hesitation to respond GU: Not examined  Resolved Hospital Problem list     Assessment & Plan:   Septic shock based on failure to clear lactic acid with volume resuscitation, presumed source is urinary tract infection with E. coli bacteremia.  She has a mild transaminitis but CT abdomen inconsistent with cholecystitis. -Following in stepdown -Agree with ceftriaxone, follow cultures and sensitivities to completion -Follow lactic acid clearance -Does not look like she will require pressors  Toxic metabolic encephalopathy due to septic shock, likely also component of her renal dysfunction -Supportive care, continue to treat underlying conditions and contributors -Reorientation, delirium precautions -Try to avoid any sedating medications  Acute on chronic stage IIIa renal failure -Progressive oliguric renal failure, likely ATN in the setting of septic shock. -Follow strict I's/O, BMP. -No urgent indication for HD.  May need to involve nephrology  if she does not pick up her urine output, begin to rebound. -Home diuretics are on hold  Diarrhea -Unclear cause, question related to her underlying infection, possible viral process -CT abdomen reassuring, C. difficile testing negative -Need to  replace her volume losses, on IV fluid -Could use Imodium if needed  Stridor / wheeze -Intermittent upper airway noise on exam.  No evidence of lower airways obstruction. -She has bronchodilators available -Continue to follow.  May benefit from cool mist mask  History of hypertension -I stopped her diltiazem and hydralazine on 12/24.  Her home Imdur and Lasix were already on hold.  Add back her BP regimen as her hemodynamics tolerate.  Would hold off on the diuretics for now given her renal failure  Type 2 diabetes mellitus with hyperglycemia -Hold Amaryl -Sliding scale insulin  Persistent atrial fibrillation -Diltiazem is on hold, question add back on 12/25.  Heart rate is in the 80s -Home Eliquis on hold, should be able to restart if no procedures planned.  There is no evidence for acute bleeding -Telemetry monitoring  Best Practice (right click and "Reselect all SmartList Selections" daily)   Diet/type: Regular consistency (see orders) DVT prophylaxis: SCD GI prophylaxis: PPI Lines: N/A Foley:  Yes, and it is still needed Code Status:  full code Last date of multidisciplinary goals of care discussion [pending]  Labs   CBC: Recent Labs  Lab 04/24/21 1821 04/25/21 0413 04/26/21 0303  WBC 9.0 19.2* 17.1*  NEUTROABS 8.1* 14.1* 12.0*  HGB 10.8* 9.9* 9.5*  HCT 33.9* 30.9* 29.1*  MCV 91.9 93.4 89.5  PLT 106* 67* 66*    Basic Metabolic Panel: Recent Labs  Lab 04/24/21 1821 04/25/21 0413 04/25/21 1835 04/25/21 2058 04/26/21 0303  NA 138 139 139 138 139  K 3.7 4.4 3.7 4.1 3.7  CL 103 104 105 105 107  CO2 24 23 22  21* 22  GLUCOSE 406* 372* 176* 159* 132*  BUN 33* 36* 47* 48* 51*  CREATININE 2.44* 3.08* 3.53* 3.50* 3.69*  CALCIUM 9.2 8.5* 7.7* 7.8* 7.8*  MG  --   --   --   --  1.8  PHOS  --   --   --   --  3.8   GFR: Estimated Creatinine Clearance: 12 mL/min (A) (by C-G formula based on SCr of 3.69 mg/dL (H)). Recent Labs  Lab 04/24/21 1821 04/24/21 1825  04/25/21 0413 04/25/21 0547 04/25/21 1112 04/25/21 1357 04/25/21 1835 04/25/21 2058 04/26/21 0303  WBC 9.0  --  19.2*  --   --   --   --   --  17.1*  LATICACIDVEN  --    < >  --    < > 3.0* 4.0* 3.4* 3.3*  --    < > = values in this interval not displayed.    Liver Function Tests: Recent Labs  Lab 04/24/21 1821 04/25/21 0413 04/26/21 0303  AST 29 63* 106*  ALT 17 31 49*  ALKPHOS 114 62 63  BILITOT 1.0 1.0 1.0  PROT 7.9 6.5 6.5  ALBUMIN 3.8 3.0* 3.0*   No results for input(s): LIPASE, AMYLASE in the last 168 hours. No results for input(s): AMMONIA in the last 168 hours.  ABG No results found for: PHART, PCO2ART, PO2ART, HCO3, TCO2, ACIDBASEDEF, O2SAT   Coagulation Profile: Recent Labs  Lab 04/24/21 1821  INR 1.3*    Cardiac Enzymes: No results for input(s): CKTOTAL, CKMB, CKMBINDEX, TROPONINI in the last 168 hours.  HbA1C: Hgb A1c MFr Bld  Date/Time Value Ref Range Status  04/25/2021 04:13 AM 9.6 (H) 4.8 - 5.6 % Final    Comment:    (NOTE) Pre diabetes:          5.7%-6.4%  Diabetes:              >6.4%  Glycemic control for   <7.0% adults with diabetes   09/06/2020 06:05 AM 10.4 (H) 4.8 - 5.6 % Final    Comment:    (NOTE) Pre diabetes:          5.7%-6.4%  Diabetes:              >6.4%  Glycemic control for   <7.0% adults with diabetes     CBG: Recent Labs  Lab 04/25/21 0737 04/25/21 1147 04/25/21 1636 04/25/21 2112 04/26/21 0735  GLUCAP 251* 212* 152* 142* 128*     Critical care time: NA       Baltazar Apo, MD, PhD 04/26/2021, 8:33 AM [facility] [phone] or if no answer before 7:00PM call [phone] For any issues after 7:00PM please call eLink [phone]

## 2021-04-27 ENCOUNTER — Inpatient Hospital Stay (HOSPITAL_COMMUNITY): Payer: Medicare HMO

## 2021-04-27 DIAGNOSIS — E440 Moderate protein-calorie malnutrition: Secondary | ICD-10-CM | POA: Insufficient documentation

## 2021-04-27 DIAGNOSIS — A4151 Sepsis due to Escherichia coli [E. coli]: Secondary | ICD-10-CM | POA: Diagnosis not present

## 2021-04-27 DIAGNOSIS — N179 Acute kidney failure, unspecified: Secondary | ICD-10-CM | POA: Diagnosis not present

## 2021-04-27 DIAGNOSIS — R7881 Bacteremia: Secondary | ICD-10-CM | POA: Diagnosis not present

## 2021-04-27 DIAGNOSIS — N3000 Acute cystitis without hematuria: Secondary | ICD-10-CM | POA: Diagnosis not present

## 2021-04-27 LAB — CBC WITH DIFFERENTIAL/PLATELET
Abs Immature Granulocytes: 0.23 10*3/uL — ABNORMAL HIGH (ref 0.00–0.07)
Basophils Absolute: 0.1 10*3/uL (ref 0.0–0.1)
Basophils Relative: 0 %
Eosinophils Absolute: 0.1 10*3/uL (ref 0.0–0.5)
Eosinophils Relative: 1 %
HCT: 26.1 % — ABNORMAL LOW (ref 36.0–46.0)
Hemoglobin: 8.5 g/dL — ABNORMAL LOW (ref 12.0–15.0)
Immature Granulocytes: 1 %
Lymphocytes Relative: 6 %
Lymphs Abs: 1 10*3/uL (ref 0.7–4.0)
MCH: 29.4 pg (ref 26.0–34.0)
MCHC: 32.6 g/dL (ref 30.0–36.0)
MCV: 90.3 fL (ref 80.0–100.0)
Monocytes Absolute: 1.2 10*3/uL — ABNORMAL HIGH (ref 0.1–1.0)
Monocytes Relative: 7 %
Neutro Abs: 15.3 10*3/uL — ABNORMAL HIGH (ref 1.7–7.7)
Neutrophils Relative %: 85 %
Platelets: 75 10*3/uL — ABNORMAL LOW (ref 150–400)
RBC: 2.89 MIL/uL — ABNORMAL LOW (ref 3.87–5.11)
RDW: 14.7 % (ref 11.5–15.5)
WBC: 17.9 10*3/uL — ABNORMAL HIGH (ref 4.0–10.5)
nRBC: 0.2 % (ref 0.0–0.2)

## 2021-04-27 LAB — CULTURE, BLOOD (ROUTINE X 2): Special Requests: ADEQUATE

## 2021-04-27 LAB — BASIC METABOLIC PANEL
Anion gap: 9 (ref 5–15)
BUN: 61 mg/dL — ABNORMAL HIGH (ref 8–23)
CO2: 21 mmol/L — ABNORMAL LOW (ref 22–32)
Calcium: 7.8 mg/dL — ABNORMAL LOW (ref 8.9–10.3)
Chloride: 108 mmol/L (ref 98–111)
Creatinine, Ser: 4.02 mg/dL — ABNORMAL HIGH (ref 0.44–1.00)
GFR, Estimated: 11 mL/min — ABNORMAL LOW (ref >60)
Glucose, Bld: 122 mg/dL — ABNORMAL HIGH (ref 70–99)
Potassium: 3.5 mmol/L (ref 3.5–5.1)
Sodium: 138 mmol/L (ref 135–145)

## 2021-04-27 LAB — LACTATE DEHYDROGENASE: LDH: 148 U/L (ref 98–192)

## 2021-04-27 LAB — GLUCOSE, CAPILLARY
Glucose-Capillary: 116 mg/dL — ABNORMAL HIGH (ref 70–99)
Glucose-Capillary: 150 mg/dL — ABNORMAL HIGH (ref 70–99)
Glucose-Capillary: 205 mg/dL — ABNORMAL HIGH (ref 70–99)
Glucose-Capillary: 256 mg/dL — ABNORMAL HIGH (ref 70–99)
Glucose-Capillary: 240 mg/dL — ABNORMAL HIGH (ref 70–99)

## 2021-04-27 LAB — URINE CULTURE: Culture: >=100000 — AB

## 2021-04-27 LAB — HEPATIC FUNCTION PANEL
ALT: 54 U/L — ABNORMAL HIGH (ref 0–44)
AST: 78 U/L — ABNORMAL HIGH (ref 15–41)
Albumin: 2.7 g/dL — ABNORMAL LOW (ref 3.5–5.0)
Alkaline Phosphatase: 65 U/L (ref 38–126)
Bilirubin, Direct: 0.2 mg/dL (ref 0.0–0.2)
Indirect Bilirubin: 0.6 mg/dL (ref 0.3–0.9)
Total Bilirubin: 0.8 mg/dL (ref 0.3–1.2)
Total Protein: 6.3 g/dL — ABNORMAL LOW (ref 6.5–8.1)

## 2021-04-27 LAB — SODIUM, URINE, RANDOM: Sodium, Ur: 12 mmol/L

## 2021-04-27 LAB — SAVE SMEAR(SSMR), FOR PROVIDER SLIDE REVIEW

## 2021-04-27 LAB — CREATININE, URINE, RANDOM: Creatinine, Urine: 96.9 mg/dL

## 2021-04-27 MED ORDER — LACTATED RINGERS IV SOLN: INTRAVENOUS | Status: AC

## 2021-04-27 MED ORDER — SODIUM CHLORIDE 0.9 % IV SOLN
3.0000 g | Freq: Two times a day (BID) | INTRAVENOUS | Status: DC
  Administered 2021-04-27 – 2021-04-29 (×4): 3 g via INTRAVENOUS
  Filled 2021-04-27 (×5): qty 8

## 2021-04-27 MED ORDER — ENSURE ENLIVE PO LIQD
237.0000 mL | Freq: Three times a day (TID) | ORAL | Status: DC
  Administered 2021-04-27 – 2021-05-01 (×9): 237 mL via ORAL

## 2021-04-27 MED ORDER — SODIUM CHLORIDE 0.9 % IV BOLUS
1500.0000 mL | Freq: Once | INTRAVENOUS | Status: AC
  Administered 2021-04-27: 18:00:00 1000 mL via INTRAVENOUS

## 2021-04-27 MED ORDER — ADULT MULTIVITAMIN W/MINERALS CH
1.0000 | ORAL_TABLET | Freq: Every day | ORAL | Status: DC
  Administered 2021-04-27 – 2021-05-02 (×6): 1 via ORAL
  Filled 2021-04-27 (×6): qty 1

## 2021-04-27 NOTE — Evaluation (Signed)
Clinical/Bedside Swallow Evaluation Patient Details  Name: [name] MRN: [id number] Date of Birth: 08-Feb-1941  Today's Date: 04/27/2021 Time: SLP Start Time (ACUTE ONLY): 0945 SLP Stop Time (ACUTE ONLY): 0930 SLP Time Calculation (min) (ACUTE ONLY): 1425 min  Past Medical History:  Past Medical History:  Diagnosis Date   Diabetes mellitus without complication ([place])    Hypertension    Past Surgical History:  Past Surgical History:  Procedure Laterality Date   CARDIOVERSION N/A 01/14/2021   Procedure: CARDIOVERSION;  Surgeon: Adrian Prows, MD;  Location: [facility] ENDOSCOPY;  Service: Cardiovascular;  Laterality: N/A;   TONSILLECTOMY     HPI:  Patient is a 80 y.o. female with PMH: type 2 diabetes, hypertension and CKD stage III. She was brought to ED on 12/23 when daughter noted patient to be having change in mental status with patient being confused, had stool incontinence. In ED, UTI was revealed as per results from UA, patient having high fever and acute on chronic renal insufficiency. Blood cultures were positive for E.coli.    Assessment / Plan / Recommendation  Clinical Impression  Patient presents with a questionable oropharyngeal phase dysphagia as per this bedside swallow evaluation. When SLP observed patient to briefly hold liquids in mouth before swallowing, patient reported that someone had once told her this was a good technique to reduce risk of aspiration. Patient did not exhibit any overt s/s aspiration or penetration with small sips of thin liquids (water). She reported h/o "stomach aches" since when she was a child. She politely declined any solids but did report she ate some solid texture items for breakfast. SLP is recommending that patient continue on c urrent PO diet of regular texture solids, thin liquids. SLP not recommending f/u ST servcies at this time but please reorder if any concern regarding patient's safety with PO intake. SLP Visit Diagnosis: Dysphagia, unspecified  (R13.10)    Aspiration Risk  Mild aspiration risk;No limitations    Diet Recommendation Regular;Thin liquid   Liquid Administration via: Cup;Straw Medication Administration: Whole meds with liquid Supervision: Patient able to self feed Postural Changes: Seated upright at 90 degrees    Other  Recommendations Oral Care Recommendations: Oral care BID    Recommendations for follow up therapy are one component of a multi-disciplinary discharge planning process, led by the attending physician.  Recommendations may be updated based on patient status, additional functional criteria and insurance authorization.  Follow up Recommendations No SLP follow up      Assistance Recommended at Discharge None  Functional Status Assessment    Frequency and Duration     N/A       Prognosis        Swallow Study   General Date of Onset: 04/24/21 HPI: Patient is a 80 y.o. female with PMH: type 2 diabetes, hypertension and CKD stage III. She was brought to ED on 12/23 when daughter noted patient to be having change in mental status with patient being confused, had stool incontinence. In ED, UTI was revealed as per results from UA, patient having high fever and acute on chronic renal insufficiency. Blood cultures were positive for E.coli. Type of Study: Bedside Swallow Evaluation Previous Swallow Assessment: none found Diet Prior to this Study: Thin liquids Temperature Spikes Noted: No Respiratory Status: Room air History of Recent Intubation: No Behavior/Cognition: Alert;Cooperative;Pleasant mood Oral Cavity Assessment: Within Functional Limits Oral Care Completed by SLP: No Oral Cavity - Dentition: Adequate natural dentition Vision: Functional for self-feeding Self-Feeding Abilities: Able to feed self Patient  Positioning: Upright in bed Baseline Vocal Quality: Normal Volitional Cough: Strong Volitional Swallow: Able to elicit    Oral/Motor/Sensory Function Overall Oral Motor/Sensory  Function: Within functional limits   Ice Chips     Thin Liquid Thin Liquid: Impaired Oral Phase Functional Implications: Prolonged oral transit;Oral holding Other Comments: when asked about brief oral holding, patient reported that someone told her that was good to do when swallowing to prevent getting choked/strangled.    Nectar Thick     Honey Thick     Puree Puree: Not tested   Solid     Solid: Not tested     Sonia Baller, MA, CCC-SLP Speech Therapy

## 2021-04-27 NOTE — Progress Notes (Signed)
PROGRESS NOTE    [name]  VPX:[id number] DOB: 04-14-1941 DOA: 04/24/2021 PCP: Trey Sailors, PA   Brief Narrative:  The patient is a 80 year old African-American female with past medical history significant for but not limited to diabetes mellitus type 2, hypertension, CKD stage IIIb as well as other comorbidities who presented to the ED with a chief complaint of altered mental status.  Patient went to her PCP appointment for regular checkup she complained to her PCP that she felt cold.  Patient's daughter states that there is no blood work done that day and then she went to bed the evening.  She did not eat dinner and woke up the next morning and is now to be incontinent of stool and feces.  She had feces running down her leg and patient was significantly confused.  Patient was noted to be febrile and EMS was called and she was brought to the ED and initial lactate level was 3.1 and UA was positive for nitrites, leuk esterase and pyuria.  She had an elevated BUN/creatinine from her baseline of 1.7 and chest x-ray was negative for any acute infiltrates.  She is given 4.25 L of IV fluid boluses and placed on maintenance IV fluid hydration and blood and urine cultures obtained.  She is started on IV Rocephin and then further work-up revealed that she got bacteremia.  Given that she consistently had elevated lactic acid level she was moved to stepdown unit and critical care was consulted.  She is improving from a mentation standpoint but her renal function continue to worsen and has had very little output and has been oliguric.  Her diarrhea is improving and C. difficile testing negative.  We will consult nephrology for further evaluation recommendations and critical care does not feel that she will require any pressors at this time.  Further cultures revealed that she has a pansensitive E. coli blood culture as well pansensitive E. coli urine culture.  We will change her antibiotics to IV Unasyn  now given her pan sensitivity.  Patient's mental status is much improved but renal function continues to worsen  Assessment & Plan:   Principal Problem:   Sepsis with acute organ dysfunction without septic shock (HCC) Active Problems:   Essential hypertension   Persistent atrial fibrillation (HCC)   DM (diabetes mellitus), type 2 with renal complications (HCC)   Acute kidney injury superimposed on chronic kidney disease ([place])   Acute cystitis without hematuria   Bacteremia   Confusion   Wheezing  Currently she is being admitted for:  Septic shock with acute organ dysfunction with acute on chronic kidney disease in the setting of E. coli bacteremia and E. coli UTI -She is admitted to a medical telemetry bed with this was changed to the stepdown unit -Initially given 4.25 L of IV fluid resuscitation and will give her another liter today and start on maintenance IV fluid hydration -Blood cultures x2 showed 1 out of 2 with E. coli and urine culture consistent with UTI with sensitivities showing greater than 100,000 colony forming units of gram-negative rods -Continue with empiric ceftriaxone but now changed to IV Unasyn given that her E. coli is pansensitive -Continue to monitor temperature curve and WBC count -Because her lactate remain consistently elevated critical care was consulted and recommended another liter bolus -WBC worsened and went from 9.0 is now 19.9 yesterday and was trending down to 17.1 yesterday and today is 17.9 -If necessary she will be placed on peripheral Levophed  Acute  toxic metabolic encephalopathy in the setting of infection as above, improved -Continue with supporting care and treat underlying causes -Avoid sedating medications if possible and continue reorientation and placed on delirium precautions -She appears back to her baseline now  Acute cystitis with Hematuria -As above follow urine cultures and continue IV ceftriaxone  AKI on superimposed on  chronic kidney disease stage IIIb Metabolic Acidosis  -Baseline creatinine is around 1.3-1.6 earlier this year -She was given 4.25 L of normal saline boluses in the ED and an additional 1 L centimeters and another 1 by critical care. -Currently getting IV fluid hydration at normal saline 75 MLS per hour -Strict I's and O's and daily weights; she is +7.549 L since admission but continues remain oliguric and have minimal output; she had about 300 out all day yesterday -Patient's CO2 is 21, AG 9, and Chloride Level was 108 -Avoid nephrotoxic medications, contrast dyes, hypotension dehydration and renally dose medications -Continue to monitor and trend BUN/creatinine and is gone from 33/2.44 -> 36/3.08 -> 47/3.53 -> 48/3.50 ->  51/3.69 and is further worsened to 61/4.02 -Foley catheter is in place -CT Scan Abd/Pelvis done and showed "No evidence of obstructive ureteral or bladder calculi. No hydronephrosis. Small right greater than left pleural effusions with small volume abdominopelvic ascites and body wall edema. Hepatomegaly. Cholelithiasis without findings of acute cholecystitis.  Aortic Atherosclerosis."  -Repeat CMP in a.m. -Nephrology has been consulted for further evaluation  Essential Hypertension -Holding her Lasix and continue monitor blood pressures is on the lower side -Continue monitor blood pressures per protocol and she has been moved to the stepdown unit for closer blood pressure monitoring -PCCM recommending resuming her home diltiazem, hydralazine, Imdur and Lasix when hemodynamically improved and if she is stable to do so -Blood pressure was 111/60  Abnormal LFTs -AST trended up and is now 106 -> 78 and ALT is now 40 -> 54 -CT scan of the abdomen pelvis showed cholelithiasis without findings of acute cholecystitis -Likely reactive in the setting of septic shock -Continue to monitor and trend LFTs closely and if necessary will obtain acute hepatitis panel in the  morning -Repeat CMP in a.m.  Normocytic anemia/anemia of chronic kidney disease -Patient is immobile/hematocrit is now at 9.5/29.1 -> 8.5/26.1 -Check anemia panel in the a.m. -Continue to monitor for signs and symptoms of bleeding; currently no overt bleeding -Repeat CBC in a.m.  Diabetes mellitus type 2 with renal complications -Continue sliding scale insulin hold her Amaryl -CBGs ranging from 94-158  Diarrhea, improved  -Could be related to underlying infection versus a viral process -She is getting IV fluid maintenance and C. difficile may be checked if she continues to worsen -C. difficile was negative and CT of the abdomen pelvis was relatively reassuring -Discontinue Enteric Precautions  Paroxysmal Atrial Fibrillation -She is in normal sinus rhythm -She is not on anticoagulation as her anticoagulation is only given to her when she was cardioverted -Continue to Monitor on Telemetry   Thrombocytopenia -In the setting of infection as above and sepsis -Platelet count from 106 -> 67 -> 66 -> 75 -Continue to monitor for signs and symptoms bleeding; currently no overt bleeding noted -Repeat CBC in a.m.  DVT prophylaxis: SCDs: Her apixaban has not been resumed but will hold for now and consider heparin drip if intervention is needed but upon further review she is no longer taking apixaban Code Status: FULL CODE Family Communication: No family currently at bedside Disposition Plan: Pending further clinical improvement  Status is:  Inpatient  Remains inpatient appropriate because: Remains confused and is sick enough to be in the stepdown unit  Consultants:  PCCM Have asked nephrology for further assistance and discussed the case with Dr. Candiss Norse  Procedures: None   Antimicrobials:  Anti-infectives (From admission, onward)    Start     Dose/Rate Route Frequency Ordered Stop   04/27/21 1345  Ampicillin-Sulbactam (UNASYN) 3 g in sodium chloride 0.9 % 100 mL IVPB        3  g 200 mL/hr over 30 Minutes Intravenous Every 12 hours 04/27/21 1254 04/30/21 2359   04/25/21 1800  cefTRIAXone (ROCEPHIN) 2 g in sodium chloride 0.9 % 100 mL IVPB  Status:  Discontinued        2 g 200 mL/hr over 30 Minutes Intravenous Every 24 hours 04/25/21 1012 04/27/21 1254   04/24/21 2030  cefTRIAXone (ROCEPHIN) 2 g in sodium chloride 0.9 % 100 mL IVPB  Status:  Discontinued        2 g 200 mL/hr over 30 Minutes Intravenous Every 24 hours 04/24/21 2017 04/25/21 1012        Subjective: Seen and examined at bedside and she appeared to be back to her baseline mental status wise.  She is not confused and awake and alert and oriented and she needs to she is in the hospital.  She denies any pain currently but had some earlier..  Urine output is not as great.  No other concerns or complaints at this time  Objective: Vitals:   04/27/21 0900 04/27/21 1000 04/27/21 1100 04/27/21 1200  BP: 114/72 (!) 106/41 108/60 111/60  Pulse: 67 64 66 74  Resp: 12 10 17 11   Temp:    97.8 F (36.6 C)  TempSrc:    Oral  SpO2: 100% 99% 100% 100%  Weight:      Height:        Intake/Output Summary (Last 24 hours) at 04/27/2021 1313 Last data filed at 04/27/2021 1200 Gross per 24 hour  Intake 865.23 ml  Output 575 ml  Net 290.23 ml    Filed Weights   04/25/21 1655 04/26/21 0440 04/27/21 0500  Weight: 72.9 kg 71.7 kg 75.8 kg   Examination: Physical Exam:  Constitutional: WN/WD overweight chronically ill-appearing African-American female currently no acute distress she is much more awake and alert and oriented Eyes: Lids and conjunctivae normal, sclerae anicteric  ENMT: External Ears, Nose appear normal. Grossly normal hearing. Mucous membranes are moist. .  Neck: Appears normal, supple, no cervical masses, normal ROM, no appreciable thyromegaly no appreciable JVD Respiratory: Diminished to auscultation bilaterally with coarse breath sounds, no wheezing, rales, rhonchi or crackles. Normal  respiratory effort and patient is not tachypenic. No accessory muscle use.  Unlabored breathing and not wearing supplemental oxygen via nasal cannula Cardiovascular: RRR, no murmurs / rubs / gallops. S1 and S2 auscultated. No extremity edema.  Abdomen: Soft, non-tender, distended secondary body habitus. Bowel sounds positive.  GU: Deferred.  Foley catheter is in place Musculoskeletal: No clubbing / cyanosis of digits/nails. No joint deformity upper and lower extremities. Skin: No rashes, lesions, ulcers. No induration; Warm and dry.  Neurologic: CN 2-12 grossly intact with no focal deficits. Romberg sign and cerebellar reflexes not assessed.  Psychiatric: Normal judgment and insight. Alert and oriented x 3. Normal mood and appropriate affect.   Data Reviewed: I have personally reviewed following labs and imaging studies  CBC: Recent Labs  Lab 04/24/21 1821 04/25/21 0413 04/26/21 0303 04/27/21 0745  WBC 9.0  19.2* 17.1* 17.9*  NEUTROABS 8.1* 14.1* 12.0* 15.3*  HGB 10.8* 9.9* 9.5* 8.5*  HCT 33.9* 30.9* 29.1* 26.1*  MCV 91.9 93.4 89.5 90.3  PLT 106* 67* 66* 75*    Basic Metabolic Panel: Recent Labs  Lab 04/25/21 0413 04/25/21 1835 04/25/21 2058 04/26/21 0303 04/27/21 0745  NA 139 139 138 139 138  K 4.4 3.7 4.1 3.7 3.5  CL 104 105 105 107 108  CO2 23 22 21* 22 21*  GLUCOSE 372* 176* 159* 132* 122*  BUN 36* 47* 48* 51* 61*  CREATININE 3.08* 3.53* 3.50* 3.69* 4.02*  CALCIUM 8.5* 7.7* 7.8* 7.8* 7.8*  MG  --   --   --  1.8  --   PHOS  --   --   --  3.8  --     GFR: Estimated Creatinine Clearance: 11.3 mL/min (A) (by C-G formula based on SCr of 4.02 mg/dL (H)). Liver Function Tests: Recent Labs  Lab 04/24/21 1821 04/25/21 0413 04/26/21 0303 04/27/21 0745  AST 29 63* 106* 78*  ALT 17 31 49* 54*  ALKPHOS 114 62 63 65  BILITOT 1.0 1.0 1.0 0.8  PROT 7.9 6.5 6.5 6.3*  ALBUMIN 3.8 3.0* 3.0* 2.7*    No results for input(s): LIPASE, AMYLASE in the last 168 hours. No  results for input(s): AMMONIA in the last 168 hours. Coagulation Profile: Recent Labs  Lab 04/24/21 1821  INR 1.3*    Cardiac Enzymes: No results for input(s): CKTOTAL, CKMB, CKMBINDEX, TROPONINI in the last 168 hours. BNP (last 3 results) No results for input(s): PROBNP in the last 8760 hours. HbA1C: Recent Labs    04/25/21 0413  HGBA1C 9.6*    CBG: Recent Labs  Lab 04/26/21 1141 04/26/21 1615 04/26/21 2237 04/27/21 0723 04/27/21 1137  GLUCAP 158* 137* 94 116* 150*    Lipid Profile: No results for input(s): CHOL, HDL, LDLCALC, TRIG, CHOLHDL, LDLDIRECT in the last 72 hours. Thyroid Function Tests: No results for input(s): TSH, T4TOTAL, FREET4, T3FREE, THYROIDAB in the last 72 hours. Anemia Panel: No results for input(s): VITAMINB12, FOLATE, FERRITIN, TIBC, IRON, RETICCTPCT in the last 72 hours. Sepsis Labs: Recent Labs  Lab 04/25/21 1112 04/25/21 1357 04/25/21 1835 04/25/21 2058  LATICACIDVEN 3.0* 4.0* 3.4* 3.3*     Recent Results (from the past 240 hour(s))  Blood Culture (routine x 2)     Status: Abnormal   Collection Time: 04/24/21  6:21 PM   Specimen: BLOOD  Result Value Ref Range Status   Specimen Description   Final    BLOOD LEFT ANTECUBITAL Performed at [facility] [address], [place], [place] [address]    Special Requests   Final    BOTTLES DRAWN AEROBIC AND ANAEROBIC Blood Culture results may not be optimal due to an excessive volume of blood received in culture bottles Performed at [facility] [address], [place], [place] [address]    Culture  Setup Time   Final    GRAM NEGATIVE RODS IN BOTH AEROBIC AND ANAEROBIC BOTTLES CRITICAL RESULT CALLED TO, READ BACK BY AND VERIFIED WITH: D,WOFFORD PHARMD @1005  04/25/21 EB Performed at [facility] [address] [place], [place] [address]    Culture ESCHERICHIA COLI (A)  Final   Report Status 04/27/2021 FINAL  Final   Organism ID, Bacteria  ESCHERICHIA COLI  Final      Susceptibility   Escherichia coli - MIC*    AMPICILLIN <=2 SENSITIVE Sensitive  CEFAZOLIN <=4 SENSITIVE Sensitive     CEFEPIME <=0.12 SENSITIVE Sensitive     CEFTAZIDIME <=1 SENSITIVE Sensitive     CEFTRIAXONE <=0.25 SENSITIVE Sensitive     CIPROFLOXACIN <=0.25 SENSITIVE Sensitive     GENTAMICIN <=1 SENSITIVE Sensitive     IMIPENEM <=0.25 SENSITIVE Sensitive     TRIMETH/SULFA <=20 SENSITIVE Sensitive     AMPICILLIN/SULBACTAM <=2 SENSITIVE Sensitive     PIP/TAZO <=4 SENSITIVE Sensitive     * ESCHERICHIA COLI  Blood Culture ID Panel (Reflexed)     Status: Abnormal   Collection Time: 04/24/21  6:21 PM  Result Value Ref Range Status   Enterococcus faecalis NOT DETECTED NOT DETECTED Final   Enterococcus Faecium NOT DETECTED NOT DETECTED Final   Listeria monocytogenes NOT DETECTED NOT DETECTED Final   Staphylococcus species NOT DETECTED NOT DETECTED Final   Staphylococcus aureus (BCID) NOT DETECTED NOT DETECTED Final   Staphylococcus epidermidis NOT DETECTED NOT DETECTED Final   Staphylococcus lugdunensis NOT DETECTED NOT DETECTED Final   Streptococcus species NOT DETECTED NOT DETECTED Final   Streptococcus agalactiae NOT DETECTED NOT DETECTED Final   Streptococcus pneumoniae NOT DETECTED NOT DETECTED Final   Streptococcus pyogenes NOT DETECTED NOT DETECTED Final   A.calcoaceticus-baumannii NOT DETECTED NOT DETECTED Final   Bacteroides fragilis NOT DETECTED NOT DETECTED Final   Enterobacterales DETECTED (A) NOT DETECTED Final    Comment: Enterobacterales represent a large order of gram negative bacteria, not a single organism. CRITICAL RESULT CALLED TO, READ BACK BY AND VERIFIED WITH: D,WOFFORD PHARMD @1005  04/25/21 EB    Enterobacter cloacae complex NOT DETECTED NOT DETECTED Final   Escherichia coli DETECTED (A) NOT DETECTED Final    Comment: CRITICAL RESULT CALLED TO, READ BACK BY AND VERIFIED WITH: D,WOFFORD PHARMD @1005  04/25/21 EB    Klebsiella  aerogenes NOT DETECTED NOT DETECTED Final   Klebsiella oxytoca NOT DETECTED NOT DETECTED Final   Klebsiella pneumoniae NOT DETECTED NOT DETECTED Final   Proteus species NOT DETECTED NOT DETECTED Final   Salmonella species NOT DETECTED NOT DETECTED Final   Serratia marcescens NOT DETECTED NOT DETECTED Final   Haemophilus influenzae NOT DETECTED NOT DETECTED Final   Neisseria meningitidis NOT DETECTED NOT DETECTED Final   Pseudomonas aeruginosa NOT DETECTED NOT DETECTED Final   Stenotrophomonas maltophilia NOT DETECTED NOT DETECTED Final   Candida albicans NOT DETECTED NOT DETECTED Final   Candida auris NOT DETECTED NOT DETECTED Final   Candida glabrata NOT DETECTED NOT DETECTED Final   Candida krusei NOT DETECTED NOT DETECTED Final   Candida parapsilosis NOT DETECTED NOT DETECTED Final   Candida tropicalis NOT DETECTED NOT DETECTED Final   Cryptococcus neoformans/gattii NOT DETECTED NOT DETECTED Final   CTX-M ESBL NOT DETECTED NOT DETECTED Final   Carbapenem resistance IMP NOT DETECTED NOT DETECTED Final   Carbapenem resistance KPC NOT DETECTED NOT DETECTED Final   Carbapenem resistance NDM NOT DETECTED NOT DETECTED Final   Carbapenem resist OXA 48 LIKE NOT DETECTED NOT DETECTED Final   Carbapenem resistance VIM NOT DETECTED NOT DETECTED Final    Comment: Performed at [facility], [address], [place], [place] [address]  Resp Panel by RT-PCR (Flu A&B, Covid) Nasopharyngeal Swab     Status: None   Collection Time: 04/24/21  6:24 PM   Specimen: Nasopharyngeal Swab; Nasopharyngeal(NP) swabs in vial transport medium  Result Value Ref Range Status   SARS Coronavirus 2 by RT PCR NEGATIVE NEGATIVE Final    Comment: (NOTE) SARS-CoV-2 target nucleic acids are  NOT DETECTED.  The SARS-CoV-2 RNA is generally detectable in upper respiratory specimens during the acute phase of infection. The lowest concentration of SARS-CoV-2 viral copies this assay can detect is 138 copies/mL. A  negative result does not preclude SARS-Cov-2 infection and should not be used as the sole basis for treatment or other patient management decisions. A negative result may occur with  improper specimen collection/handling, submission of specimen other than nasopharyngeal swab, presence of viral mutation(s) within the areas targeted by this assay, and inadequate number of viral copies(<138 copies/mL). A negative result must be combined with clinical observations, patient history, and epidemiological information. The expected result is Negative.  Fact Sheet for Patients:  EntrepreneurPulse.com.au  Fact Sheet for Healthcare Providers:  IncredibleEmployment.be  This test is no t yet approved or cleared by the [place] FDA and  has been authorized for detection and/or diagnosis of SARS-CoV-2 by FDA under an Emergency Use Authorization (EUA). This EUA will remain  in effect (meaning this test can be used) for the duration of the COVID-19 declaration under Section 564(b)(1) of the Act, 21 U.S.C.section 360bbb-3(b)(1), unless the authorization is terminated  or revoked sooner.       Influenza A by PCR NEGATIVE NEGATIVE Final   Influenza B by PCR NEGATIVE NEGATIVE Final    Comment: (NOTE) The Xpert Xpress SARS-CoV-2/FLU/RSV plus assay is intended as an aid in the diagnosis of influenza from Nasopharyngeal swab specimens and should not be used as a sole basis for treatment. Nasal washings and aspirates are unacceptable for Xpert Xpress SARS-CoV-2/FLU/RSV testing.  Fact Sheet for Patients: EntrepreneurPulse.com.au  Fact Sheet for Healthcare Providers: IncredibleEmployment.be  This test is not yet approved or cleared by the [place] FDA and has been authorized for detection and/or diagnosis of SARS-CoV-2 by FDA under an Emergency Use Authorization (EUA). This EUA will remain in effect (meaning this test can  be used) for the duration of the COVID-19 declaration under Section 564(b)(1) of the Act, 21 U.S.C. section 360bbb-3(b)(1), unless the authorization is terminated or revoked.  Performed at [facility], [place] [address], [place], [place] [address]   Urine Culture     Status: Abnormal   Collection Time: 04/24/21  6:35 PM   Specimen: In/Out Cath Urine  Result Value Ref Range Status   Specimen Description   Final    IN/OUT CATH URINE Performed at [facility] [address] [place], [place] [address]    Special Requests   Final    NONE Performed at [facility], [place] [address] [place],  [address]    Culture >=100,000 COLONIES/mL ESCHERICHIA COLI (A)  Final   Report Status 04/27/2021 FINAL  Final   Organism ID, Bacteria ESCHERICHIA COLI (A)  Final      Susceptibility   Escherichia coli - MIC*    AMPICILLIN <=2 SENSITIVE Sensitive     CEFAZOLIN <=4 SENSITIVE Sensitive     CEFEPIME <=0.12 SENSITIVE Sensitive     CEFTRIAXONE <=0.25 SENSITIVE Sensitive     CIPROFLOXACIN <=0.25 SENSITIVE Sensitive     GENTAMICIN <=1 SENSITIVE Sensitive     IMIPENEM <=0.25 SENSITIVE Sensitive     NITROFURANTOIN <=16 SENSITIVE Sensitive     TRIMETH/SULFA <=20 SENSITIVE Sensitive     AMPICILLIN/SULBACTAM <=2 SENSITIVE Sensitive     PIP/TAZO <=4 SENSITIVE Sensitive     * >=100,000 COLONIES/mL ESCHERICHIA COLI  Blood Culture (routine x 2)     Status: Abnormal   Collection Time: 04/24/21  8:31 PM   Specimen: BLOOD  Result Value Ref Range Status   Specimen Description   Final    BLOOD Performed at [facility] [address] [place], [place] [address]    Special Requests   Final    BOTTLES DRAWN AEROBIC AND ANAEROBIC Blood Culture adequate volume Performed at [facility] [address] [place], [place] [address]    Culture  Setup Time   Final    GRAM NEGATIVE RODS IN BOTH AEROBIC AND  ANAEROBIC BOTTLES CRITICAL VALUE NOTED.  VALUE IS CONSISTENT WITH PREVIOUSLY REPORTED AND CALLED VALUE.    Culture (A)  Final    ESCHERICHIA COLI SUSCEPTIBILITIES PERFORMED ON PREVIOUS CULTURE WITHIN THE LAST 5 DAYS. Performed at [facility], [place] [address], [place], [place] [address]    Report Status 04/27/2021 FINAL  Final  MRSA Next Gen by PCR, Nasal     Status: None   Collection Time: 04/25/21  4:50 PM   Specimen: Nasal Mucosa; Nasal Swab  Result Value Ref Range Status   MRSA by PCR Next Gen NOT DETECTED NOT DETECTED Final    Comment: (NOTE) The GeneXpert MRSA Assay (FDA approved for NASAL specimens only), is one component of a comprehensive MRSA colonization surveillance program. It is not intended to diagnose MRSA infection nor to guide or monitor treatment for MRSA infections. Test performance is not FDA approved in patients less than 66 years old. Performed at [facility], [place] [address], [place], [place] [address]   C Difficile Quick Screen w PCR reflex     Status: None   Collection Time: 04/26/21  4:58 AM   Specimen: STOOL  Result Value Ref Range Status   C Diff antigen NEGATIVE NEGATIVE Final   C Diff toxin NEGATIVE NEGATIVE Final   C Diff interpretation No C. difficile detected.  Final    Comment: Performed at [facility], [place] [address], [place],  [address]     RN Pressure Injury Documentation:     Estimated body mass index is 28.68 kg/m as calculated from the following:   Height as of this encounter: 5\' 4"  (1.626 m).   Weight as of this encounter: 75.8 kg.  Malnutrition Type:   Malnutrition Characteristics:   Nutrition Interventions:    Radiology Studies: CT ABDOMEN PELVIS WO CONTRAST  Result Date: 04/25/2021 CLINICAL DATA:  AK I, history of renal stone. EXAM: CT ABDOMEN AND PELVIS WITHOUT CONTRAST TECHNIQUE: Multidetector CT imaging of the abdomen and pelvis was performed following the standard  protocol without IV contrast. COMPARISON:  CT January 16, 2021 FINDINGS: Lower chest: Small right greater than left pleural effusions. Calcified granuloma in the right lower lobe. Hepatobiliary: Hepatomegaly measuring 20.6 cm in maximum craniocaudal dimension. No hepatic lesion visualized on this noncontrast examination. Periportal edema, possibly related to fluid resuscitation. Cholelithiasis without findings of acute cholecystitis. No biliary ductal dilation. Pancreas: No pancreatic ductal dilation or evidence of acute inflammation. Spleen: Within normal limits. Adrenals/Urinary Tract: Similar left adrenal thickening. Right adrenal gland appears normal. No hydronephrosis. No nephrolithiasis. No obstructive ureteral or bladder calculi identified. Calcifications along the bilateral ureters appear to be associated with the gonadal veins likely reflecting phleboliths. Urinary bladder is decompressed around a Foley catheter. Stomach/Bowel: No enteric contrast was administered. No evidence of bowel obstruction. No evidence of acute bowel wall thickening or inflammatory changes. Vascular/Lymphatic: Aortic and branch vessel atherosclerosis without abdominal aortic aneurysm. No pathologically enlarged abdominal or pelvic lymph nodes.  Reproductive: Calcified uterine leiomyomas. No suspicious adnexal mass identified. Other: Small volume abdominopelvic ascites, similar prior. Diffuse body wall edema. Musculoskeletal: Similar mild compression deformity of the L2 superior endplate. No acute osseous abnormality. IMPRESSION: 1. No evidence of obstructive ureteral or bladder calculi. No hydronephrosis. 2. Small right greater than left pleural effusions with small volume abdominopelvic ascites and body wall edema. 3. Hepatomegaly. 4. Cholelithiasis without findings of acute cholecystitis. 5.  Aortic Atherosclerosis (ICD10-I70.0). Electronically Signed   By: Dahlia Bailiff M.D.   On: 04/25/2021 22:07   DG CHEST PORT 1  VIEW  Result Date: 04/25/2021 CLINICAL DATA:  Wheezing EXAM: PORTABLE CHEST 1 VIEW COMPARISON:  April 24, 2021 FINDINGS: The cardiomediastinal silhouette is unchanged in contour.Atherosclerotic calcifications. No pleural effusion. No pneumothorax. No acute pleuroparenchymal abnormality. Visualized abdomen is unremarkable. IMPRESSION: No acute cardiopulmonary abnormality. Electronically Signed   By: Valentino Saxon M.D.   On: 04/25/2021 17:57    Scheduled Meds:  Chlorhexidine Gluconate Cloth  6 each Topical Q0600   dicyclomine  10 mg Oral TID AC & HS   insulin aspart  0-15 Units Subcutaneous TID WC   insulin aspart  0-5 Units Subcutaneous QHS   mouth rinse  15 mL Mouth Rinse BID   pantoprazole (PROTONIX) IV  40 mg Intravenous QHS   Continuous Infusions:  ampicillin-sulbactam (UNASYN) IV     lactated ringers 75 mL/hr at 04/27/21 1230    LOS: 3 days   Kerney Elbe, DO [facility] PAGER is on AMION  If 7PM-7AM, please contact night-coverage [email or web address]

## 2021-04-27 NOTE — Consult Note (Signed)
Renal Service Consult Note [facility]  [place] Will 04/27/2021 [name], MD Requesting Physician: Dr. Alfredia Ferguson  Reason for Consult: Renal failure HPI: The patient is a 80 y.o. year-old w/ hx of DM2, HTN, CKD 3 presented on 12/23 w/ c/o feeling poor for a few days, not as verbal as usual. Pt presented covered in urine and feces. Dtr was w/ the patient. In ED temp was 103 deg F, LA 3.1, UA +wbc's. wBC 9K, Hb 10 and plts < 100K. Creat 2.4, baseline 1.7. CXR negative. Pt rec'd 4.2 L IVF bolus. Cx's sent and IV rocephin started. Pt admitted. Lactate was up and stayed up, CCM consulted and recommended more bolus IVF"s. WBC ^'d 9K > 17K. MS improved since admission. Her creat worsening from 2.4 >> 4.0 today. Asked to see for renal failure.   Pt seen in ICU. Pleasant elderly lady, no specific c/o's.  No cough, SOB or abd pain. Dtr at bedside, denies knowing of any kidney issues. Foley in w/ 300 cc out recently per RN.   ROS - denies CP, no joint pain, no HA, no blurry vision, no rash, no diarrhea, no nausea/ vomiting   Past Medical History  Past Medical History:  Diagnosis Date   Diabetes mellitus without complication ([place])    Hypertension    Past Surgical History  Past Surgical History:  Procedure Laterality Date   CARDIOVERSION N/A 01/14/2021   Procedure: CARDIOVERSION;  Surgeon: Adrian Prows, MD;  Location: [place];  Service: Cardiovascular;  Laterality: N/A;   TONSILLECTOMY     Family History History reviewed. No pertinent family history. Social History  reports that she has never smoked. She has never used smokeless tobacco. She reports that she does not drink alcohol and does not use drugs. Allergies  Allergies  Allergen Reactions   Peanut-Containing Drug Products     Stomach pain   Amlodipine Swelling   Home medications Prior to Admission medications   Medication Sig Start Date End Date Taking? Authorizing Provider  amLODipine (NORVASC) 10 MG tablet  Take 10 mg by mouth daily. 04/08/21  Yes [provider]  diltiazem (CARDIZEM CD) 180 MG 24 hr capsule Take 1 capsule (180 mg total) by mouth daily. 02/02/21  Yes Cantwell, Celeste C, PA-C  furosemide (LASIX) 20 MG tablet Take 20 mg by mouth 2 (two) times daily.   Yes [provider]  HUMULIN 70/30 KWIKPEN (70-30) 100 UNIT/ML KwikPen Inject 15 Units into the skin at bedtime. 04/11/21  Yes [provider]  hydrALAZINE (APRESOLINE) 50 MG tablet TAKE 1 TABLET(50 MG) BY MOUTH THREE TIMES DAILY Patient taking differently: Take 50 mg by mouth 3 (three) times daily. 11/14/20  Yes Cantwell, Celeste C, PA-C  isosorbide dinitrate (ISORDIL) 30 MG tablet TAKE 1 TABLET(30 MG) BY MOUTH THREE TIMES DAILY Patient taking differently: Take 30 mg by mouth 3 (three) times daily. 04/15/21  Yes Cantwell, Celeste C, PA-C  Menthol-Methyl Salicylate (MUSCLE RUB EX) Apply 1 application topically daily as needed (leg cramps).   Yes [provider]  olmesartan (BENICAR) 20 MG tablet Take 20 mg by mouth daily. 04/11/21  Yes [provider]  pravastatin (PRAVACHOL) 20 MG tablet Take 1 tablet (20 mg total) by mouth daily. Patient taking differently: Take 20 mg by mouth at bedtime. 12/16/20  Yes Cantwell, Celeste C, PA-C  Accu-Chek FastClix Lancets MISC See admin instructions. 01/16/19   [provider]  apixaban (ELIQUIS) 5 MG TABS tablet Take 1 tablet (5 mg total) by  mouth 2 (two) times daily. Patient not taking: Reported on 04/25/2021 09/08/20   Jennye Boroughs, MD  BD PEN NEEDLE NANO 2ND GEN 32G X 4 MM MISC 3 (three) times daily. as directed 08/13/19   [provider]  Blood Glucose Monitoring Suppl (ACCU-CHEK GUIDE ME) w/Device KIT See admin instructions. 12/11/18   [provider]  glucose blood test strip TEST BLOOD SUGARS 3 4 TIMES DAILY. 10/27/17   [provider]     Vitals:   04/27/21 0900 04/27/21 1000 04/27/21 1100 04/27/21 1200  BP: 114/72  (!) 106/41 108/60 111/60  Pulse: 67 64 66 74  Resp: _0 Temp:    97.8 F (36.6 C)  TempSrc:    Oral  SpO2: 100% 99% 100% 100%  Weight:      Height:       Exam Gen alert, no distress No rash, cyanosis or gangrene Sclera anicteric, throat clear  No jvd or bruits Chest clear bilat to bases, no rales/ wheezing RRR no MRG Abd soft ntnd no mass or ascites +bs GU foley in place, clear light yellow urine in tube MS no joint effusions or deformity Ext no LE or UE edema, no wounds or ulcers Neuro is alert, Ox 3 , nf     Home meds include - cardizem cd 180, lasix 20 bid, hydralazine 50 tid, isordil tid, pravachol, eliquis, insulin, prns    Date   Creat  eGFR   Jan 2020  1.88   Dec 2020  1.36   Jan 2021  1.17 August 2019  1.51   March- May 2022 1.28- 1.59 32- 43 ml/min, IIIb   July- Oct 2022 1.54- 1.95 27-42 ml/min, IIIb   Dec 23  2.44  20    Dec 24  3.08   Dec 25  3.69   Apr 27, 2021  4.02  11     UA 12/23 - cloudy, large Hb, mod LE, >300 protein, rare bact, >50 rbc/ wbc's    UCx 12/23 +EColi > 100K    ECHO  May 2022 - LVEF 55- 60%, mod-severe RV dysfunction w/ severe pHTN    UNa, UCr pending    CT abd 12/24 - Kidneys: Right adrenal gland appears normal. No hydronephrosis. No nephrolithiasis. No obstructive ureteral or bladder calculi identified. Calcifications along the bilateral ureters appear to be associated with the gonadal veins likely reflecting phleboliths. Urinary bladder is decompressed around a Foley catheter.     CXR 12/23 - IMPRESSION: No acute cardiopulmonary abnormality     Na 138  K 3.5  CO2 21  AG 9  CO2 21  BN 61  cr 4.02  Alb 2.7  ca 7.8           Hb 8.5- 9.5   WBC 17K   plts 66- 75  Assessment/ Plan: AKI on CKD 3b - b/l creatinine 1.5- 1.9 from mid 2022, eGFR 27-42 ml/min.  Here w/ AKI in setting of marked vol depletion and urosepsis. Creat 2.4 on admission 12/23 and up to 4.0 today. UOP improving since admit. Still looks a bit dry on exam. Will  rebolus and cont LR at 75 cc/ hr. BP's are > 100. Meds reviewed. Should recover, hopefully RRT will not be needed. Get urine lytes, renal US. Will follow.  Septic shock / urosepsis - +Ecoli in UCx. ^^WBC. Improving w/ IV abx AMS - improving HTN - home BP lowering meds on hold ^LFT's - per pmd Anemia -  per pmd DM2  Diarrhea - improved PAF Thrombocytopenia - get smear made, check hapto/ LDH      Rob Ronne Savoia  MD 04/27/2021, 4:06 PM  Recent Labs  Lab 04/26/21 0303 04/27/21 0745  WBC 17.1* 17.9*  HGB 9.5* 8.5*   Recent Labs  Lab 04/26/21 0303 04/27/21 0745  K 3.7 3.5  BUN 51* 61*  CREATININE 3.69* 4.02*  CALCIUM 7.8* 7.8*  PHOS 3.8  --

## 2021-04-27 NOTE — Progress Notes (Signed)
NAME:  [name], MRN:  [id number], DOB:  05-Jan-1941, LOS: 3 ADMISSION DATE:  04/24/2021, CONSULTATION DATE:  04/25/21 REFERRING MD:  Dr Alfredia Ferguson, CHIEF COMPLAINT:  septic shock   History of Present Illness:  80 year old woman with history of type 2 diabetes, hypertension and CKD stage III.  She was brought to the emergency department 04/24/2021 when she was noted to have a change in mental status by her daughter.  She was confused, had stool incontinence.  Evaluation revealed urinary tract infection based on UA, high fever, acute on chronic renal insufficiency (baseline serum creatinine 1.7).  She was treated with IV fluid resuscitation and broad-spectrum antibiotics, now ceftriaxone.  Blood cultures now positive for gram-negative rods, BC ID shows E. coli.Marland Kitchen  Her blood pressure has stabilized but she continues to have oliguria.  Lactic acid has failed to clear 3.9 > 4.3 > 3.0 > 4.0  Pertinent  Medical History   Past Medical History:  Diagnosis Date   Diabetes mellitus without complication ([place])    Hypertension   CKD stage III   Significant Hospital Events: Including procedures, antibiotic start and stop dates in addition to other pertinent events   Blood cultures 12/23 >> pansensitive E coli Urine culture 12/23 >> 100k cfu GNR >> E coli C diff 12/25 >> negative CT abdomen pelvis 12/24 > no evidence of ureteral or cystic calculi.  No hydronephrosis.  Small bilateral pleural effusions.  Hepatomegaly.  Cholelithiasis without any evidence for acute cholecystitis  Interim History / Subjective:   E coli urine and blood cx's UOP picking up some: 207 >> 350 (12/25) Mental status normalized Thirsty, tolerating p.o.  Objective   Blood pressure 108/60, pulse 66, temperature 97.6 F (36.4 C), temperature source Oral, resp. rate 17, height 5\' 4"  (1.626 m), weight 75.8 kg, SpO2 100 %.        Intake/Output Summary (Last 24 hours) at 04/27/2021 1131 Last data filed at 04/27/2021 1100 Gross  per 24 hour  Intake 1345.23 ml  Output 550 ml  Net 795.23 ml   Filed Weights   04/25/21 1655 04/26/21 0440 04/27/21 0500  Weight: 72.9 kg 71.7 kg 75.8 kg    Examination: General: Pleasant woman, no distress HENT: Some alopecia, pupils equal and oropharynx clear.  No stridor Lungs: Decreased to both bases but otherwise clear, no referred upper airway noise Cardiovascular: Regular, distant, no murmur Abdomen: Nondistended, positive bowel sounds Extremities: No edema Neuro: Fully awake, appropriate, interacting.  Moves all extremities.  Follows commands.  Daughter is at bedside and indicates that her mental status appears to be at normal baseline GU: Foley catheter, clear urine  Resolved Hospital Problem list     Assessment & Plan:   Septic shock based on failure to clear lactic acid with volume resuscitation, presumed source is urinary tract infection with E. coli bacteremia.  She has a mild transaminitis but CT abdomen inconsistent with cholecystitis. -Agree with ceftriaxone -Continue supportive care -Question whether she can move out of the stepdown unit 83/25  Toxic metabolic encephalopathy due to septic shock, likely also component of her renal dysfunction.  Resolved -Continue to minimize sedating medications -Supportive care  Acute on chronic stage IIIa renal failure -Oliguric renal failure likely due to ATN in the setting of septic shock.  Beginning to make some urine.  Hopefully renal injury is plateauing and we will see some improvement in her serum creatinine -Follow strict I's/O, BMP -Home diuretics are on hold  Diarrhea -C. difficile testing and CT abdomen both  unrevealing -Replace her volume losses -Need to replace her volume losses, on IV fluid  Stridor / wheeze.  Resolved -Intermittent upper airway noise on exam.  No evidence of lower airways obstruction. -She has bronchodilators available -Continue to follow  History of hypertension -Restart home  diltiazem, hydralazine, Imdur, Lasix when hemodynamically improved, stable to do so.  Type 2 diabetes mellitus with hyperglycemia -Hold Amaryl -Sliding scale insulin  Persistent atrial fibrillation -Diltiazem currently on hold. -Eliquis is on hold, question restart 12/26 -Telemetry monitoring  PCCM will sign off.  Please call if we can assist in any way.  Best Practice (right click and "Reselect all SmartList Selections" daily)   Diet/type: Regular consistency (see orders) DVT prophylaxis: SCD GI prophylaxis: PPI Lines: N/A Foley:  Yes, and it is still needed Code Status:  full code Last date of multidisciplinary goals of care discussion [pending] Discussed plans with pt and daughter on 12/26  Labs   CBC: Recent Labs  Lab 04/24/21 1821 04/25/21 0413 04/26/21 0303 04/27/21 0745  WBC 9.0 19.2* 17.1* 17.9*  NEUTROABS 8.1* 14.1* 12.0* 15.3*  HGB 10.8* 9.9* 9.5* 8.5*  HCT 33.9* 30.9* 29.1* 26.1*  MCV 91.9 93.4 89.5 90.3  PLT 106* 67* 66* 75*    Basic Metabolic Panel: Recent Labs  Lab 04/25/21 0413 04/25/21 1835 04/25/21 2058 04/26/21 0303 04/27/21 0745  NA 139 139 138 139 138  K 4.4 3.7 4.1 3.7 3.5  CL 104 105 105 107 108  CO2 23 22 21* 22 21*  GLUCOSE 372* 176* 159* 132* 122*  BUN 36* 47* 48* 51* 61*  CREATININE 3.08* 3.53* 3.50* 3.69* 4.02*  CALCIUM 8.5* 7.7* 7.8* 7.8* 7.8*  MG  --   --   --  1.8  --   PHOS  --   --   --  3.8  --    GFR: Estimated Creatinine Clearance: 11.3 mL/min (A) (by C-G formula based on SCr of 4.02 mg/dL (H)). Recent Labs  Lab 04/24/21 1821 04/24/21 1825 04/25/21 0413 04/25/21 0547 04/25/21 1112 04/25/21 1357 04/25/21 1835 04/25/21 2058 04/26/21 0303 04/27/21 0745  WBC 9.0  --  19.2*  --   --   --   --   --  17.1* 17.9*  LATICACIDVEN  --    < >  --    < > 3.0* 4.0* 3.4* 3.3*  --   --    < > = values in this interval not displayed.    Liver Function Tests: Recent Labs  Lab 04/24/21 1821 04/25/21 0413 04/26/21 0303  04/27/21 0745  AST 29 63* 106* 78*  ALT 17 31 49* 54*  ALKPHOS 114 62 63 65  BILITOT 1.0 1.0 1.0 0.8  PROT 7.9 6.5 6.5 6.3*  ALBUMIN 3.8 3.0* 3.0* 2.7*   No results for input(s): LIPASE, AMYLASE in the last 168 hours. No results for input(s): AMMONIA in the last 168 hours.  ABG No results found for: PHART, PCO2ART, PO2ART, HCO3, TCO2, ACIDBASEDEF, O2SAT   Coagulation Profile: Recent Labs  Lab 04/24/21 1821  INR 1.3*    Cardiac Enzymes: No results for input(s): CKTOTAL, CKMB, CKMBINDEX, TROPONINI in the last 168 hours.  HbA1C: Hgb A1c MFr Bld  Date/Time Value Ref Range Status  04/25/2021 04:13 AM 9.6 (H) 4.8 - 5.6 % Final    Comment:    (NOTE) Pre diabetes:          5.7%-6.4%  Diabetes:              >  6.4%  Glycemic control for   <7.0% adults with diabetes   09/06/2020 06:05 AM 10.4 (H) 4.8 - 5.6 % Final    Comment:    (NOTE) Pre diabetes:          5.7%-6.4%  Diabetes:              >6.4%  Glycemic control for   <7.0% adults with diabetes     CBG: Recent Labs  Lab 04/26/21 0735 04/26/21 1141 04/26/21 1615 04/26/21 2237 04/27/21 0723  GLUCAP 128* 158* 137* 94 116*     Critical care time: NA       Baltazar Apo, MD, PhD 04/27/2021, 11:31 AM [facility] [phone] or if no answer before 7:00PM call [phone] For any issues after 7:00PM please call eLink [phone]

## 2021-04-27 NOTE — Progress Notes (Signed)
Initial Nutrition Assessment  DOCUMENTATION CODES:  Non-severe (moderate) malnutrition in context of chronic illness  INTERVENTION:  Add Ensure Plus High Protein po TID, each supplement provides 350 kcal and 20 grams of protein.   Add MVI with minerals daily.  Encourage PO and supplement intake.  NUTRITION DIAGNOSIS:  Moderate Malnutrition related to chronic illness (uncontrolled T2DM) as evidenced by mild fat depletion, mild muscle depletion.  GOAL:  Patient will meet greater than or equal to 90% of their needs  MONITOR:  PO intake, Supplement acceptance, Labs, Weight trends, I & O's  REASON FOR ASSESSMENT:  Malnutrition Screening Tool    ASSESSMENT:  80 yo female with a PMH of type 2 diabetes, hypertension and CKD stage III. She was brought to the emergency department 04/24/2021 when she was noted to have a change in mental status by her daughter. Admitted with sepsis with acute organ dysfunction without septic shock.  Spoke with pt and daughter at bedside. Pt reports that she has had poor appetite for the past 3-4 days. She also reports diarrhea, but no nausea or vomiting. Pt would not elaborate on how much she had been eating previously.  Per Epic, pt ate 50% of lunch yesterday. No other meals documented.  She believes she has lost a lot of weight, but is unsure of the exact amount and over how long.  Weight appears stable over the course of the past 8 months with the exception of a weight on 9/15, which appears to be an error.  Of note, pt with mild generalized edema. This may be masking further loss.  Medications: reviewed; SSI, Protonix, LR @ 75 ml/hr, oxycodone PO PRN (given once today)  Labs: reviewed; BUN 61 (H), Crt 4.02 (H), CBG 94-158 HbA1c: 9.6% (04/25/2021)  NUTRITION - FOCUSED PHYSICAL EXAM: Flowsheet Row Most Recent Value  Orbital Region Mild depletion  Upper Arm Region Mild depletion  Thoracic and Lumbar Region No depletion  Buccal Region Mild  depletion  Temple Region No depletion  Clavicle Bone Region No depletion  Clavicle and Acromion Bone Region No depletion  Scapular Bone Region No depletion  Dorsal Hand Moderate depletion  Patellar Region Mild depletion  Anterior Thigh Region Mild depletion  Posterior Calf Region Mild depletion  Edema (RD Assessment) Mild  Hair Reviewed  Eyes Reviewed  Mouth Reviewed  Skin Reviewed  Nails Reviewed   Diet Order:   Diet Order             Diet heart healthy/carb modified Room service appropriate? Yes; Fluid consistency: Thin  Diet effective now                  EDUCATION NEEDS:  Education needs have been addressed  Skin:  Skin Assessment: Reviewed RN Assessment  Last BM:  04/26/21 - x3  Height:  Ht Readings from Last 1 Encounters:  04/25/21 5\' 4"  (1.626 m)   Weight:  Wt Readings from Last 1 Encounters:  04/27/21 75.8 kg   BMI:  Body mass index is 28.68 kg/m.  Estimated Nutritional Needs:  Kcal:  2000-2200 Protein:  95-110 grams Fluid:  >2 L  Derrel Nip, RD, LDN (she/her/hers) Clinical Inpatient Dietitian RD Pager/After-Hours/Weekend Pager # in [place]

## 2021-04-28 ENCOUNTER — Inpatient Hospital Stay (HOSPITAL_COMMUNITY): Payer: Medicare HMO

## 2021-04-28 DIAGNOSIS — N179 Acute kidney failure, unspecified: Secondary | ICD-10-CM | POA: Diagnosis not present

## 2021-04-28 DIAGNOSIS — R7881 Bacteremia: Secondary | ICD-10-CM | POA: Diagnosis not present

## 2021-04-28 DIAGNOSIS — N3000 Acute cystitis without hematuria: Secondary | ICD-10-CM | POA: Diagnosis not present

## 2021-04-28 DIAGNOSIS — A4151 Sepsis due to Escherichia coli [E. coli]: Secondary | ICD-10-CM | POA: Diagnosis not present

## 2021-04-28 LAB — GLUCOSE, CAPILLARY
Glucose-Capillary: 374 mg/dL — ABNORMAL HIGH (ref 70–99)
Glucose-Capillary: 183 mg/dL — ABNORMAL HIGH (ref 70–99)
Glucose-Capillary: 157 mg/dL — ABNORMAL HIGH (ref 70–99)
Glucose-Capillary: 134 mg/dL — ABNORMAL HIGH (ref 70–99)

## 2021-04-28 LAB — CBC WITH DIFFERENTIAL/PLATELET
Abs Immature Granulocytes: 0.4 10*3/uL — ABNORMAL HIGH (ref 0.00–0.07)
Band Neutrophils: 3 %
Basophils Absolute: 0 10*3/uL (ref 0.0–0.1)
Basophils Relative: 0 %
Blasts: 1 %
Eosinophils Absolute: 0.4 10*3/uL (ref 0.0–0.5)
Eosinophils Relative: 3 %
HCT: 26.6 % — ABNORMAL LOW (ref 36.0–46.0)
Hemoglobin: 8.6 g/dL — ABNORMAL LOW (ref 12.0–15.0)
Lymphocytes Relative: 8 %
Lymphs Abs: 1.1 10*3/uL (ref 0.7–4.0)
MCH: 28.8 pg (ref 26.0–34.0)
MCHC: 32.3 g/dL (ref 30.0–36.0)
MCV: 89 fL (ref 80.0–100.0)
Monocytes Absolute: 0.7 10*3/uL (ref 0.1–1.0)
Monocytes Relative: 5 %
Myelocytes: 3 %
Neutro Abs: 11.3 10*3/uL — ABNORMAL HIGH (ref 1.7–7.7)
Neutrophils Relative %: 77 %
Platelets: 68 10*3/uL — ABNORMAL LOW (ref 150–400)
RBC: 2.99 MIL/uL — ABNORMAL LOW (ref 3.87–5.11)
RDW: 14.9 % (ref 11.5–15.5)
WBC: 14.1 10*3/uL — ABNORMAL HIGH (ref 4.0–10.5)
nRBC: 0.4 % — ABNORMAL HIGH (ref 0.0–0.2)
nRBC: 1 /100 WBC — ABNORMAL HIGH

## 2021-04-28 LAB — COMPREHENSIVE METABOLIC PANEL
ALT: 48 U/L — ABNORMAL HIGH (ref 0–44)
AST: 55 U/L — ABNORMAL HIGH (ref 15–41)
Albumin: 2.5 g/dL — ABNORMAL LOW (ref 3.5–5.0)
Alkaline Phosphatase: 69 U/L (ref 38–126)
Anion gap: 11 (ref 5–15)
BUN: 67 mg/dL — ABNORMAL HIGH (ref 8–23)
CO2: 19 mmol/L — ABNORMAL LOW (ref 22–32)
Calcium: 7.8 mg/dL — ABNORMAL LOW (ref 8.9–10.3)
Chloride: 105 mmol/L (ref 98–111)
Creatinine, Ser: 3.51 mg/dL — ABNORMAL HIGH (ref 0.44–1.00)
GFR, Estimated: 13 mL/min — ABNORMAL LOW (ref >60)
Glucose, Bld: 380 mg/dL — ABNORMAL HIGH (ref 70–99)
Potassium: 3.7 mmol/L (ref 3.5–5.1)
Sodium: 135 mmol/L (ref 135–145)
Total Bilirubin: 0.6 mg/dL (ref 0.3–1.2)
Total Protein: 5.7 g/dL — ABNORMAL LOW (ref 6.5–8.1)

## 2021-04-28 LAB — PHOSPHORUS: Phosphorus: 2.8 mg/dL (ref 2.5–4.6)

## 2021-04-28 LAB — MAGNESIUM: Magnesium: 1.9 mg/dL (ref 1.7–2.4)

## 2021-04-28 MED ORDER — LACTATED RINGERS IV SOLN: INTRAVENOUS | Status: AC

## 2021-04-28 MED ORDER — INSULIN ASPART 100 UNIT/ML IJ SOLN: 0.0000 [IU] | Freq: Every day | INTRAMUSCULAR | Status: DC

## 2021-04-28 MED ORDER — INSULIN GLARGINE-YFGN 100 UNIT/ML ~~LOC~~ SOLN
8.0000 [IU] | Freq: Every day | SUBCUTANEOUS | Status: DC
  Administered 2021-04-28 – 2021-05-01 (×3): 8 [IU] via SUBCUTANEOUS
  Filled 2021-04-28 (×5): qty 0.08

## 2021-04-28 MED ORDER — INSULIN ASPART 100 UNIT/ML IJ SOLN
3.0000 [IU] | Freq: Three times a day (TID) | INTRAMUSCULAR | Status: DC
  Administered 2021-04-28 – 2021-05-02 (×9): 3 [IU] via SUBCUTANEOUS

## 2021-04-28 MED ORDER — FENTANYL CITRATE PF 50 MCG/ML IJ SOSY
12.5000 ug | PREFILLED_SYRINGE | INTRAMUSCULAR | Status: DC | PRN
  Administered 2021-04-28: 14:00:00 12.5 ug via INTRAVENOUS
  Filled 2021-04-28: qty 1

## 2021-04-28 MED ORDER — INSULIN ASPART 100 UNIT/ML IJ SOLN
0.0000 [IU] | Freq: Three times a day (TID) | INTRAMUSCULAR | Status: DC
  Administered 2021-04-28: 12:00:00 4 [IU] via SUBCUTANEOUS

## 2021-04-28 MED ORDER — INSULIN ASPART 100 UNIT/ML IJ SOLN
0.0000 [IU] | Freq: Three times a day (TID) | INTRAMUSCULAR | Status: DC
  Administered 2021-04-28: 18:00:00 3 [IU] via SUBCUTANEOUS
  Administered 2021-04-29 (×2): 2 [IU] via SUBCUTANEOUS
  Administered 2021-04-29 – 2021-04-30 (×2): 5 [IU] via SUBCUTANEOUS
  Administered 2021-04-30: 08:00:00 3 [IU] via SUBCUTANEOUS
  Administered 2021-04-30: 14:00:00 5 [IU] via SUBCUTANEOUS
  Administered 2021-05-01: 18:00:00 11 [IU] via SUBCUTANEOUS
  Administered 2021-05-01: 12:00:00 3 [IU] via SUBCUTANEOUS

## 2021-04-28 NOTE — Progress Notes (Signed)
[facility] Progress Note  Subjective: UOP 1100 cc yesterday, creat down 3.5 today w/ BUN 67. No new c/o's.    Vitals:   04/28/21 0340 04/28/21 0400 04/28/21 0500 04/28/21 0800  BP:  (!) 119/53    Pulse:  73    Resp:  17    Temp: 98.5 F (36.9 C)   98.2 F (36.8 C)  TempSrc: Oral   Oral  SpO2:  99%    Weight:   78.1 kg   Height:        Exam: Gen alert, no distress  No jvd or bruits Chest clear bilat to bases Abd soft ntnd no mass or ascites +bs GU foley in place, clear light yellow urine in tube MS no joint effusions or deformity Ext no LE or UE edema Neuro is alert, Ox 3 , nf      Home meds include - cardizem cd 180, lasix 20 bid, hydralazine 50 tid, isordil tid, pravachol, eliquis, insulin, prns     Date                           Creat               eGFR   Jan 2020                   1.88   Dec 2020                   1.36   Jan 2021                   1.17 August 2019                  1.51   March- May 2022      1.28- 1.59        32- 43 ml/min, IIIb   July- Oct 2022           1.54- 1.95        27-42 ml/min, IIIb   Dec 23                       2.44                 20           Dec 24                       3.08   Dec 25                       3.69   Apr 27, 2021             4.02                 11       UA 12/23 - cloudy, large Hb, mod LE, >300 protein, rare bact, >50 rbc/ wbc's    UCx 12/23 +EColi > 100K    ECHO  May 2022 - LVEF 55- 60%, mod-severe RV dysfunction w/ severe pHTN    UNa 12, UCr 96     CT abd 12/24 - Kidneys: Right adrenal gland appears normal. No hydronephrosis. No nephrolithiasis. No obstructive ureteral or bladder calculi identified. Calcifications along the bilateral ureters appear to be associated with the gonadal veins likely reflecting phleboliths. Urinary bladder is decompressed around a Foley  catheter.     CXR 12/23 - IMPRESSION: No acute cardiopulmonary abnormality   Renal US - 10-12 cm kidneys w/o hydro        Assessment/  Plan: AKI on CKD 3b - b/l creatinine 1.5- 1.9 from mid 2022, eGFR 27-42 ml/min.  Here w/ AKI in setting of marked vol depletion and urosepsis. Creat 2.4 on admission 12/23 and up to 4.0 today. Urine lytes c/w prerenal. Suspected AKI d/t vol depletion and sepsis.  Re-bolused on 12/26 w/ 2L. 1100cc UOP yesterday. Creat down to 3.5 today. Should continue to improve. Cont IVF"s. Will follow.   Septic shock / urosepsis - +Ecoli in UCx. ^^WBC. Improving w/ IV abx AMS - improving HTN - home BP lowering meds on hold ^LFT's - per pmd Anemia - per pmd DM2  Diarrhea - improved PAF Thrombocytopenia - LDH wnl, smear w/o schistocytes, hapto pending. Doubt MAHA/ TMA.          Rob Belal Scallon 04/28/2021, 9:01 AM   Recent Labs  Lab 04/26/21 0303 04/27/21 0745 04/28/21 0257  K 3.7 3.5 3.7  BUN 51* 61* 67*  CREATININE 3.69* 4.02* 3.51*  CALCIUM 7.8* 7.8* 7.8*  PHOS 3.8  --  2.8  HGB 9.5* 8.5* 8.6*   Inpatient medications:  Chlorhexidine Gluconate Cloth  6 each Topical Q0600   dicyclomine  10 mg Oral TID AC & HS   feeding supplement  237 mL Oral TID BM   insulin aspart  0-15 Units Subcutaneous TID WC   insulin aspart  0-5 Units Subcutaneous QHS   mouth rinse  15 mL Mouth Rinse BID   multivitamin with minerals  1 tablet Oral Daily   pantoprazole (PROTONIX) IV  40 mg Intravenous QHS    ampicillin-sulbactam (UNASYN) IV Stopped (04/28/21 0231)   lactated ringers 75 mL/hr at 04/28/21 0555   acetaminophen **OR** acetaminophen, fentaNYL (SUBLIMAZE) injection, ondansetron **OR** ondansetron (ZOFRAN) IV, oxyCODONE

## 2021-04-28 NOTE — Progress Notes (Signed)
Inpatient Diabetes Program Recommendations  AACE/ADA: New Consensus Statement on Inpatient Glycemic Control (2015)  Target Ranges:  Prepandial:   less than 140 mg/dL      Peak postprandial:   less than 180 mg/dL (1-2 hours)      Critically ill patients:  140 - 180 mg/dL   Lab Results  Component Value Date   GLUCAP 183 (H) 04/28/2021   HGBA1C 9.6 (H) 04/25/2021    Review of Glycemic Control  Diabetes history: DM2 Outpatient Diabetes medications: Humulin 70/30 15 units QHS Current orders for Inpatient glycemic control: Novolog 0-20 units TID with meals and 0-5 HS Ensure Enlive supplement TID  HgbA1C - 9.6% (may not be accurate with low H/H)  Inpatient Diabetes Program Recommendations:    Consider adding Semglee 8 units QHS Decrease Novolog to 0-15 units TID with meals and 0-5 HS (AKI on CKD IIIb) Add Novolog 3 units TID with meals if eating > 50% meal  Continue to follow glucose trends.  Thank you. Lorenda Peck, RD, LDN, CDE Inpatient Diabetes Coordinator [phone]

## 2021-04-28 NOTE — Progress Notes (Signed)
PROGRESS NOTE    [name]  PXT:[id number] DOB: January 11, 1941 DOA: 04/24/2021 PCP: Trey Sailors, PA   Brief Narrative:  The patient is a 80 year old African-American female with past medical history significant for but not limited to diabetes mellitus type 2, hypertension, CKD stage IIIb as well as other comorbidities who presented to the ED with a chief complaint of altered mental status.  Patient went to her PCP appointment for regular checkup she complained to her PCP that she felt cold.  Patient's daughter states that there is no blood work done that day and then she went to bed the evening.  She did not eat dinner and woke up the next morning and is now to be incontinent of stool and feces.  She had feces running down her leg and patient was significantly confused.  Patient was noted to be febrile and EMS was called and she was brought to the ED and initial lactate level was 3.1 and UA was positive for nitrites, leuk esterase and pyuria.  She had an elevated BUN/creatinine from her baseline of 1.7 and chest x-ray was negative for any acute infiltrates.  She is given 4.25 L of IV fluid boluses and placed on maintenance IV fluid hydration and blood and urine cultures obtained.  She is started on IV Rocephin and then further work-up revealed that she got bacteremia.  Given that she consistently had elevated lactic acid level she was moved to stepdown unit and critical care was consulted.  She is improving from a mentation standpoint but her renal function continue to worsen and has had very little output and has been oliguric.  Her diarrhea is improving and C. difficile testing negative.  We will consult nephrology for further evaluation recommendations and critical care does not feel that she will require any pressors at this time.  Further cultures revealed that she has a pansensitive E. coli blood culture as well pansensitive E. coli urine culture.  We will change her antibiotics to IV Unasyn  now given her pan sensitivity.  Patient's mental status is much improved but renal function continued to worsen so nephrology evaluated and felt that she is still dry so given additional bolus which has helped her.  Her renal function is slowly improving and we will try a trial of void today.  Mentation is improving we will continue antibiotics with IV Unasyn for now  Assessment & Plan:   Principal Problem:   Sepsis with acute organ dysfunction without septic shock (HCC) Active Problems:   Essential hypertension   Persistent atrial fibrillation (HCC)   DM (diabetes mellitus), type 2 with renal complications (HCC)   Acute kidney injury superimposed on chronic kidney disease ([place])   Acute cystitis without hematuria   Bacteremia   Confusion   Wheezing   Malnutrition of moderate degree  Currently she is being admitted for:  Septic shock with acute organ dysfunction with acute on chronic kidney disease in the setting of E. coli bacteremia and E. coli UTI -She is admitted to a medical telemetry bed with this was changed to the stepdown unit -Initially given 4.25 L of IV fluid resuscitation and will give her another liter today and start on maintenance IV fluid hydration -Blood cultures x2 showed 1 out of 2 with E. coli and urine culture consistent with UTI with sensitivities showing greater than 100,000 colony forming units of gram-negative rods -Continue with empiric ceftriaxone but now changed to IV Unasyn given that her E. coli is pansensitive and will  continue for now -Continue to monitor temperature curve and WBC count -Because her lactate remain consistently elevated critical care was consulted and recommended another liter bolus -WBC went from 9.0 ->19.9-> 17.1 -> 17.9 -> 14.1 -Her sepsis physiology is improving and will need to continue monitor; she is stable to be transferred to the floor and out of the ICU  Acute toxic metabolic encephalopathy in the setting of infection as above,  improved -Continue with supporting care and treat underlying causes -Avoid sedating medications if possible and continue reorientation and placed on delirium precautions -She appears back to her baseline now  Acute cystitis with Hematuria -As above follow urine cultures and continue IV ceftriaxone  AKI on superimposed on chronic kidney disease stage IIIb Metabolic Acidosis  -Baseline creatinine is around 1.3-1.6 earlier this year -She was given 4.25 L of normal saline boluses in the ED and an additional 1 L centimeters and another 1 by critical care. -Currently getting IV fluid hydration at normal saline 75 MLS per hour -Strict I's and O's and daily weights; she is +9.934 L since admission but continues remain oliguric and have minimal output; she had about 300 out all day yesterday -Patient does have a small metabolic acidosis with a CO2 of 19, anion gap of 11, chloride level of 105 -Avoid nephrotoxic medications, contrast dyes, hypotension dehydration and renally dose medications -Continue to monitor and trend BUN/creatinine and is gone from 33/2.44 -> 36/3.08 -> 47/3.53 -> 48/3.50 ->  51/3.69 -> 61/4.02 -> 67/3.51 -Foley catheter is in place but will remove today for trial of void -CT Scan Abd/Pelvis done and showed "No evidence of obstructive ureteral or bladder calculi. No hydronephrosis. Small right greater than left pleural effusions with small volume abdominopelvic ascites and body wall edema. Hepatomegaly. Cholelithiasis without findings of acute cholecystitis.  Aortic Atherosclerosis."  -Repeat CMP in a.m. -Nephrology has been consulted for further evaluation and they felt that she was dry and they recommend continue IV fluid resuscitation and they gave her an additional bolus yesterday; -Renal ultrasound ordered by nephrology done and showed "Slightly echogenic kidneys consistent with medical renal disease. No hydronephrosis.Small right pleural effusion and small amount of ascites in  the pelvis."  Essential Hypertension -Holding her Lasix and continue monitor blood pressures is on the lower side -Continue monitor blood pressures per protocol and she has been moved to the stepdown unit for closer blood pressure monitoring -PCCM recommending resuming her home diltiazem, hydralazine, Imdur and Lasix when hemodynamically improved and if she is stable to do so -Blood pressure was 113/34  Abnormal LFTs -AST trended up and is now 106 -> 78 -> 55 and ALT is now 40 -> 54 -> 48 -CT scan of the abdomen pelvis showed cholelithiasis without findings of acute cholecystitis -Likely reactive in the setting of septic shock she was complaining of right-sided upper abdominal quadrant pain so we will obtain a right upper quadrant ultrasound to evaluate a bit further given that her LFTs do remain elevated and that she is having right upper quadrant pain. -Continue to monitor and trend LFTs closely and if necessary will obtain acute hepatitis panel in the morning -Repeat CMP in a.m.  Normocytic anemia/anemia of chronic kidney disease -Patient is immobile/hematocrit is now at 9.5/29.1 -> 8.5/26.1 -Check anemia panel in the a.m. -Continue to monitor for signs and symptoms of bleeding; currently no overt bleeding -Repeat CBC in a.m.  Diabetes mellitus type 2 with renal complications -Continue sliding scale insulin hold her Amaryl -CBGs ranging from  183-374  Diarrhea, improved  -Could be related to underlying infection versus a viral process -She is getting IV fluid maintenance and C. difficile may be checked if she continues to worsen -C. difficile was negative and CT of the abdomen pelvis was relatively reassuring -Discontinue Enteric Precautions -Continues to have some diarrhea but likley Abx Mediated   Paroxysmal Atrial Fibrillation -She is in normal sinus rhythm -She is not on anticoagulation as her anticoagulation is only given to her when she was cardioverted -Continue to Monitor  on Telemetry   Thrombocytopenia -In the setting of infection as above and sepsis -Platelet count from 106 -> 67 -> 66 -> 75 -> 68 -Continue to monitor for signs and symptoms bleeding; currently no overt bleeding noted -Repeat CBC in a.m.  DVT prophylaxis: SCDs: Her apixaban has not been resumed but will hold for now and consider heparin drip if intervention is needed but upon further review she is no longer taking apixaban Code Status: FULL CODE Family Communication: No family currently at bedside Disposition Plan: Pending further clinical improvement  Status is: Inpatient  Remains inpatient appropriate because: Remains confused and is sick enough to be in the stepdown unit  Consultants:  PCCM Have asked nephrology for further assistance and discussed the case with Dr. Candiss Norse  Procedures: None   Antimicrobials:  Anti-infectives (From admission, onward)    Start     Dose/Rate Route Frequency Ordered Stop   04/27/21 1345  Ampicillin-Sulbactam (UNASYN) 3 g in sodium chloride 0.9 % 100 mL IVPB        3 g 200 mL/hr over 30 Minutes Intravenous Every 12 hours 04/27/21 1254 04/30/21 2359   04/25/21 1800  cefTRIAXone (ROCEPHIN) 2 g in sodium chloride 0.9 % 100 mL IVPB  Status:  Discontinued        2 g 200 mL/hr over 30 Minutes Intravenous Every 24 hours 04/25/21 1012 04/27/21 1254   04/24/21 2030  cefTRIAXone (ROCEPHIN) 2 g in sodium chloride 0.9 % 100 mL IVPB  Status:  Discontinued        2 g 200 mL/hr over 30 Minutes Intravenous Every 24 hours 04/24/21 2017 04/25/21 1012        Subjective: Seen and examined at bedside and she was complaining of some abdominal pain today in the right upper quadrant.  No nausea or vomiting.  Back to her baseline mental lites.  Renal function improving and she is having more urinary output.  Will DC Foley for trial of void.  No other concerns or complaints this time.  Objective: Vitals:   04/28/21 0400 04/28/21 0500 04/28/21 0800 04/28/21 1200  BP:  (!) 119/53  125/74 (!) 113/34  Pulse: 73  77 74  Resp: 17  16 18   Temp:   98.2 F (36.8 C) 98 F (36.7 C)  TempSrc:   Oral Oral  SpO2: 99%  99% 99%  Weight:  78.1 kg    Height:        Intake/Output Summary (Last 24 hours) at 04/28/2021 1511 Last data filed at 04/28/2021 1333 Gross per 24 hour  Intake 3335.83 ml  Output 950 ml  Net 2385.83 ml    Filed Weights   04/27/21 0500 04/27/21 1640 04/28/21 0500  Weight: 75.8 kg 75.5 kg 78.1 kg   Examination: Physical Exam:  Constitutional: WN/WD overweight chronically ill-appearing African-American female currently in no acute distress and appears At baseline Eyes: Lids and conjunctivae normal, sclerae anicteric  ENMT: External Ears, Nose appear normal. Grossly normal hearing. Mucous  membranes are moist.  Neck: Appears normal, supple, no cervical masses, normal ROM, no appreciable thyromegaly: No appreciable JVD Respiratory: Diminished to auscultation bilaterally, no wheezing, rales, rhonchi or crackles. Normal respiratory effort and patient is not tachypenic. No accessory muscle use.  Unlabored breathing Cardiovascular: RRR, no murmurs / rubs / gallops. S1 and S2 auscultated.  No appreciable lower extremity edema Abdomen: Soft, tender to palpate on the right side, distended secondary body habitus.  Bowel sounds positive.  GU: Deferred.  Has a Foley catheter in place Musculoskeletal: No clubbing / cyanosis of digits/nails. No joint deformity upper and lower extremities. Skin: No rashes, lesions, ulcers on limited skin evaluation. No induration; Warm and dry.  Neurologic: CN 2-12 grossly intact with no focal deficits.  Romberg sign and cerebellar reflexes not assessed.  Psychiatric: Normal judgment and insight. Alert and oriented x 3. Normal mood and appropriate affect.   Data Reviewed: I have personally reviewed following labs and imaging studies  CBC: Recent Labs  Lab 04/24/21 1821 04/25/21 0413 04/26/21 0303 04/27/21 0745  04/28/21 0257  WBC 9.0 19.2* 17.1* 17.9* 14.1*  NEUTROABS 8.1* 14.1* 12.0* 15.3* 11.3*  HGB 10.8* 9.9* 9.5* 8.5* 8.6*  HCT 33.9* 30.9* 29.1* 26.1* 26.6*  MCV 91.9 93.4 89.5 90.3 89.0  PLT 106* 67* 66* 75* 68*    Basic Metabolic Panel: Recent Labs  Lab 04/25/21 1835 04/25/21 2058 04/26/21 0303 04/27/21 0745 04/28/21 0257  NA 139 138 139 138 135  K 3.7 4.1 3.7 3.5 3.7  CL 105 105 107 108 105  CO2 22 21* 22 21* 19*  GLUCOSE 176* 159* 132* 122* 380*  BUN 47* 48* 51* 61* 67*  CREATININE 3.53* 3.50* 3.69* 4.02* 3.51*  CALCIUM 7.7* 7.8* 7.8* 7.8* 7.8*  MG  --   --  1.8  --  1.9  PHOS  --   --  3.8  --  2.8    GFR: Estimated Creatinine Clearance: 13.2 mL/min (A) (by C-G formula based on SCr of 3.51 mg/dL (H)). Liver Function Tests: Recent Labs  Lab 04/24/21 1821 04/25/21 0413 04/26/21 0303 04/27/21 0745 04/28/21 0257  AST 29 63* 106* 78* 55*  ALT 17 31 49* 54* 48*  ALKPHOS 114 62 63 65 69  BILITOT 1.0 1.0 1.0 0.8 0.6  PROT 7.9 6.5 6.5 6.3* 5.7*  ALBUMIN 3.8 3.0* 3.0* 2.7* 2.5*    No results for input(s): LIPASE, AMYLASE in the last 168 hours. No results for input(s): AMMONIA in the last 168 hours. Coagulation Profile: Recent Labs  Lab 04/24/21 1821  INR 1.3*    Cardiac Enzymes: No results for input(s): CKTOTAL, CKMB, CKMBINDEX, TROPONINI in the last 168 hours. BNP (last 3 results) No results for input(s): PROBNP in the last 8760 hours. HbA1C: No results for input(s): HGBA1C in the last 72 hours.  CBG: Recent Labs  Lab 04/27/21 1641 04/27/21 1926 04/27/21 2109 04/28/21 0722 04/28/21 1134  GLUCAP 205* 256* 240* 374* 183*    Lipid Profile: No results for input(s): CHOL, HDL, LDLCALC, TRIG, CHOLHDL, LDLDIRECT in the last 72 hours. Thyroid Function Tests: No results for input(s): TSH, T4TOTAL, FREET4, T3FREE, THYROIDAB in the last 72 hours. Anemia Panel: No results for input(s): VITAMINB12, FOLATE, FERRITIN, TIBC, IRON, RETICCTPCT in the last 72  hours. Sepsis Labs: Recent Labs  Lab 04/25/21 1112 04/25/21 1357 04/25/21 1835 04/25/21 2058  LATICACIDVEN 3.0* 4.0* 3.4* 3.3*     Recent Results (from the past 240 hour(s))  Blood Culture (routine x 2)  Status: Abnormal   Collection Time: 04/24/21  6:21 PM   Specimen: BLOOD  Result Value Ref Range Status   Specimen Description   Final    BLOOD LEFT ANTECUBITAL Performed at [facility] [address], [place], [place] [address]    Special Requests   Final    BOTTLES DRAWN AEROBIC AND ANAEROBIC Blood Culture results may not be optimal due to an excessive volume of blood received in culture bottles Performed at [facility] [address], [place], [place] [address]    Culture  Setup Time   Final    GRAM NEGATIVE RODS IN BOTH AEROBIC AND ANAEROBIC BOTTLES CRITICAL RESULT CALLED TO, READ BACK BY AND VERIFIED WITH: D,WOFFORD PHARMD @1005  04/25/21 EB Performed at [facility] [address], [place], [place] [address]    Culture ESCHERICHIA COLI (A)  Final   Report Status 04/27/2021 FINAL  Final   Organism ID, Bacteria ESCHERICHIA COLI  Final      Susceptibility   Escherichia coli - MIC*    AMPICILLIN <=2 SENSITIVE Sensitive     CEFAZOLIN <=4 SENSITIVE Sensitive     CEFEPIME <=0.12 SENSITIVE Sensitive     CEFTAZIDIME <=1 SENSITIVE Sensitive     CEFTRIAXONE <=0.25 SENSITIVE Sensitive     CIPROFLOXACIN <=0.25 SENSITIVE Sensitive     GENTAMICIN <=1 SENSITIVE Sensitive     IMIPENEM <=0.25 SENSITIVE Sensitive     TRIMETH/SULFA <=20 SENSITIVE Sensitive     AMPICILLIN/SULBACTAM <=2 SENSITIVE Sensitive     PIP/TAZO <=4 SENSITIVE Sensitive     * ESCHERICHIA COLI  Blood Culture ID Panel (Reflexed)     Status: Abnormal   Collection Time: 04/24/21  6:21 PM  Result Value Ref Range Status   Enterococcus faecalis NOT DETECTED NOT DETECTED Final   Enterococcus Faecium NOT DETECTED NOT DETECTED Final   Listeria monocytogenes NOT  DETECTED NOT DETECTED Final   Staphylococcus species NOT DETECTED NOT DETECTED Final   Staphylococcus aureus (BCID) NOT DETECTED NOT DETECTED Final   Staphylococcus epidermidis NOT DETECTED NOT DETECTED Final   Staphylococcus lugdunensis NOT DETECTED NOT DETECTED Final   Streptococcus species NOT DETECTED NOT DETECTED Final   Streptococcus agalactiae NOT DETECTED NOT DETECTED Final   Streptococcus pneumoniae NOT DETECTED NOT DETECTED Final   Streptococcus pyogenes NOT DETECTED NOT DETECTED Final   A.calcoaceticus-baumannii NOT DETECTED NOT DETECTED Final   Bacteroides fragilis NOT DETECTED NOT DETECTED Final   Enterobacterales DETECTED (A) NOT DETECTED Final    Comment: Enterobacterales represent a large order of gram negative bacteria, not a single organism. CRITICAL RESULT CALLED TO, READ BACK BY AND VERIFIED WITH: D,WOFFORD PHARMD @1005  04/25/21 EB    Enterobacter cloacae complex NOT DETECTED NOT DETECTED Final   Escherichia coli DETECTED (A) NOT DETECTED Final    Comment: CRITICAL RESULT CALLED TO, READ BACK BY AND VERIFIED WITH: D,WOFFORD PHARMD @1005  04/25/21 EB    Klebsiella aerogenes NOT DETECTED NOT DETECTED Final   Klebsiella oxytoca NOT DETECTED NOT DETECTED Final   Klebsiella pneumoniae NOT DETECTED NOT DETECTED Final   Proteus species NOT DETECTED NOT DETECTED Final   Salmonella species NOT DETECTED NOT DETECTED Final   Serratia marcescens NOT DETECTED NOT DETECTED Final   Haemophilus influenzae NOT DETECTED NOT DETECTED Final   Neisseria meningitidis NOT DETECTED NOT DETECTED Final   Pseudomonas aeruginosa NOT DETECTED NOT DETECTED Final   Stenotrophomonas maltophilia NOT DETECTED NOT DETECTED Final   Candida albicans NOT DETECTED NOT DETECTED Final  Candida auris NOT DETECTED NOT DETECTED Final   Candida glabrata NOT DETECTED NOT DETECTED Final   Candida krusei NOT DETECTED NOT DETECTED Final   Candida parapsilosis NOT DETECTED NOT DETECTED Final   Candida  tropicalis NOT DETECTED NOT DETECTED Final   Cryptococcus neoformans/gattii NOT DETECTED NOT DETECTED Final   CTX-M ESBL NOT DETECTED NOT DETECTED Final   Carbapenem resistance IMP NOT DETECTED NOT DETECTED Final   Carbapenem resistance KPC NOT DETECTED NOT DETECTED Final   Carbapenem resistance NDM NOT DETECTED NOT DETECTED Final   Carbapenem resist OXA 48 LIKE NOT DETECTED NOT DETECTED Final   Carbapenem resistance VIM NOT DETECTED NOT DETECTED Final    Comment: Performed at [facility] [address], [place], [place] [address]  Resp Panel by RT-PCR (Flu A&B, Covid) Nasopharyngeal Swab     Status: None   Collection Time: 04/24/21  6:24 PM   Specimen: Nasopharyngeal Swab; Nasopharyngeal(NP) swabs in vial transport medium  Result Value Ref Range Status   SARS Coronavirus 2 by RT PCR NEGATIVE NEGATIVE Final    Comment: (NOTE) SARS-CoV-2 target nucleic acids are NOT DETECTED.  The SARS-CoV-2 RNA is generally detectable in upper respiratory specimens during the acute phase of infection. The lowest concentration of SARS-CoV-2 viral copies this assay can detect is 138 copies/mL. A negative result does not preclude SARS-Cov-2 infection and should not be used as the sole basis for treatment or other patient management decisions. A negative result may occur with  improper specimen collection/handling, submission of specimen other than nasopharyngeal swab, presence of viral mutation(s) within the areas targeted by this assay, and inadequate number of viral copies(<138 copies/mL). A negative result must be combined with clinical observations, patient history, and epidemiological information. The expected result is Negative.  Fact Sheet for Patients:  EntrepreneurPulse.com.au  Fact Sheet for Healthcare Providers:  IncredibleEmployment.be  This test is no t yet approved or cleared by the [place] FDA and  has been authorized for detection  and/or diagnosis of SARS-CoV-2 by FDA under an Emergency Use Authorization (EUA). This EUA will remain  in effect (meaning this test can be used) for the duration of the COVID-19 declaration under Section 564(b)(1) of the Act, 21 U.S.C.section 360bbb-3(b)(1), unless the authorization is terminated  or revoked sooner.       Influenza A by PCR NEGATIVE NEGATIVE Final   Influenza B by PCR NEGATIVE NEGATIVE Final    Comment: (NOTE) The Xpert Xpress SARS-CoV-2/FLU/RSV plus assay is intended as an aid in the diagnosis of influenza from Nasopharyngeal swab specimens and should not be used as a sole basis for treatment. Nasal washings and aspirates are unacceptable for Xpert Xpress SARS-CoV-2/FLU/RSV testing.  Fact Sheet for Patients: EntrepreneurPulse.com.au  Fact Sheet for Healthcare Providers: IncredibleEmployment.be  This test is not yet approved or cleared by the [place] FDA and has been authorized for detection and/or diagnosis of SARS-CoV-2 by FDA under an Emergency Use Authorization (EUA). This EUA will remain in effect (meaning this test can be used) for the duration of the COVID-19 declaration under Section 564(b)(1) of the Act, 21 U.S.C. section 360bbb-3(b)(1), unless the authorization is terminated or revoked.  Performed at [facility], [place] [address] [place], [place] [address]   Urine Culture     Status: Abnormal   Collection Time: 04/24/21  6:35 PM   Specimen: In/Out Cath Urine  Result Value Ref Range Status   Specimen Description   Final    IN/OUT CATH URINE Performed  at [facility], [place] [address] [place], [place] [address]    Special Requests   Final    NONE Performed at [facility], [place] [address], [place], [place] [address]    Culture >=100,000 COLONIES/mL ESCHERICHIA COLI (A)  Final   Report Status 04/27/2021 FINAL  Final   Organism ID, Bacteria  ESCHERICHIA COLI (A)  Final      Susceptibility   Escherichia coli - MIC*    AMPICILLIN <=2 SENSITIVE Sensitive     CEFAZOLIN <=4 SENSITIVE Sensitive     CEFEPIME <=0.12 SENSITIVE Sensitive     CEFTRIAXONE <=0.25 SENSITIVE Sensitive     CIPROFLOXACIN <=0.25 SENSITIVE Sensitive     GENTAMICIN <=1 SENSITIVE Sensitive     IMIPENEM <=0.25 SENSITIVE Sensitive     NITROFURANTOIN <=16 SENSITIVE Sensitive     TRIMETH/SULFA <=20 SENSITIVE Sensitive     AMPICILLIN/SULBACTAM <=2 SENSITIVE Sensitive     PIP/TAZO <=4 SENSITIVE Sensitive     * >=100,000 COLONIES/mL ESCHERICHIA COLI  Blood Culture (routine x 2)     Status: Abnormal   Collection Time: 04/24/21  8:31 PM   Specimen: BLOOD  Result Value Ref Range Status   Specimen Description   Final    BLOOD Performed at [facility] [address] [place], [place] [address]    Special Requests   Final    BOTTLES DRAWN AEROBIC AND ANAEROBIC Blood Culture adequate volume Performed at [facility] [address], [place], [place] [address]    Culture  Setup Time   Final    GRAM NEGATIVE RODS IN BOTH AEROBIC AND ANAEROBIC BOTTLES CRITICAL VALUE NOTED.  VALUE IS CONSISTENT WITH PREVIOUSLY REPORTED AND CALLED VALUE.    Culture (A)  Final    ESCHERICHIA COLI SUSCEPTIBILITIES PERFORMED ON PREVIOUS CULTURE WITHIN THE LAST 5 DAYS. Performed at [facility], [place] [address] [place], [place] [address]    Report Status 04/27/2021 FINAL  Final  MRSA Next Gen by PCR, Nasal     Status: None   Collection Time: 04/25/21  4:50 PM   Specimen: Nasal Mucosa; Nasal Swab  Result Value Ref Range Status   MRSA by PCR Next Gen NOT DETECTED NOT DETECTED Final    Comment: (NOTE) The GeneXpert MRSA Assay (FDA approved for NASAL specimens only), is one component of a comprehensive MRSA colonization surveillance program. It is not intended to diagnose MRSA infection nor to guide or monitor treatment for MRSA  infections. Test performance is not FDA approved in patients less than 58 years old. Performed at [facility], [place] [address] [place], [place] [address]   C Difficile Quick Screen w PCR reflex     Status: None   Collection Time: 04/26/21  4:58 AM   Specimen: STOOL  Result Value Ref Range Status   C Diff antigen NEGATIVE NEGATIVE Final   C Diff toxin NEGATIVE NEGATIVE Final   C Diff interpretation No C. difficile detected.  Final    Comment: Performed at [facility], [place] [address], [place], [place] [address]     RN Pressure Injury Documentation:     Estimated body mass index is 29.55 kg/m as calculated from the following:   Height as of this encounter: 5\' 4"  (1.626 m).   Weight as of this encounter: 78.1 kg.  Malnutrition Type: Nutrition Problem: Moderate Malnutrition Etiology: chronic illness (uncontrolled T2DM) Malnutrition Characteristics: Signs/Symptoms: mild fat depletion, mild muscle depletion Nutrition Interventions:  Interventions: Ensure Enlive (each supplement provides 350kcal and 20 grams of protein), MVI  Radiology Studies: US RENAL  Result Date: 04/27/2021 CLINICAL DATA:  Acute renal failure EXAM: RENAL / URINARY TRACT ULTRASOUND COMPLETE COMPARISON:  CT 04/25/2021 FINDINGS: Right Kidney: Renal measurements: 11.4 x 3.8 x 3.5 cm = volume: 79.8 mL. Cortex slightly echogenic. No mass or hydronephrosis. Left Kidney: Renal measurements: 10.6 x 4.4 x 5.3 cm = volume: 128.4 mL. Cortex slightly echogenic. No mass or hydronephrosis. Bladder: Decompressed by Foley catheter. Other: Small amount of free fluid in the pelvis. Small right pleural effusion. Enlarged appearing liver. IMPRESSION: 1. Slightly echogenic kidneys consistent with medical renal disease. No hydronephrosis. 2. Small right pleural effusion and small amount of ascites in the pelvis. Electronically Signed   By: Donavan Foil M.D.   On: 04/27/2021 20:55    Scheduled Meds:   Chlorhexidine Gluconate Cloth  6 each Topical Q0600   dicyclomine  10 mg Oral TID AC & HS   feeding supplement  237 mL Oral TID BM   insulin aspart  0-15 Units Subcutaneous TID WC   insulin aspart  0-5 Units Subcutaneous QHS   insulin aspart  3 Units Subcutaneous TID WC   insulin glargine-yfgn  8 Units Subcutaneous QHS   mouth rinse  15 mL Mouth Rinse BID   multivitamin with minerals  1 tablet Oral Daily   pantoprazole (PROTONIX) IV  40 mg Intravenous QHS   Continuous Infusions:  ampicillin-sulbactam (UNASYN) IV 200 mL/hr at 04/28/21 1333   lactated ringers Stopped (04/28/21 1305)    LOS: 4 days   Kerney Elbe, DO [facility] PAGER is on AMION  If 7PM-7AM, please contact night-coverage [email or web address]

## 2021-04-28 NOTE — Progress Notes (Signed)
1300 Pt transferred via wheelchair to 1501. On arrival to room, pt stated she had an "accident". Pt had watery BM. Pt cleaned up from wheelchair and assisted to bed. Pt's IVF and IV Abx hung. Dgt at bedside. No needs at this time. Report was called prior to Jeanett Schlein, RN.

## 2021-04-28 NOTE — Evaluation (Signed)
Occupational Therapy Evaluation Patient Details Name: [name] MRN: [id number] DOB: 1941/02/06 Today's Date: 04/28/2021   History of Present Illness Patient is a 80 y.o. female with PMH: type 2 diabetes, hypertension and CKD stage III. She was brought to ED on 12/23 when daughter noted patient to be having change in mental status with patient being confused, had stool incontinence. In ED, UTI was revealed as per results from UA, patient having high fever and acute on chronic renal insufficiency. Blood cultures were positive for E.coli.   Clinical Impression   Patient is a 80 year old female who was admitted for above. Currently, patient was mod A +2 for transfers with poor safety awareness and using RW. Patient was noted to have decreased functional activity tolerance, decreased endurance, decreased safety awareness, decreased standing balance and decreased knowledge of AE/AD impacting participation in ADLs. Patient would continue to benefit from skilled OT services at this time while admitted and after d/c to address noted deficits in order to improve overall safety and independence in ADLs.       Recommendations for follow up therapy are one component of a multi-disciplinary discharge planning process, led by the attending physician.  Recommendations may be updated based on patient status, additional functional criteria and insurance authorization.   Follow Up Recommendations  Home health OT    Assistance Recommended at Discharge Frequent or constant Supervision/Assistance  Functional Status Assessment  Patient has had a recent decline in their functional status and demonstrates the ability to make significant improvements in function in a reasonable and predictable amount of time.  Equipment Recommendations  None recommended by OT    Recommendations for Other Services       Precautions / Restrictions Precautions Precautions: Fall Restrictions Weight Bearing Restrictions: No       Mobility Bed Mobility Overal bed mobility: Needs Assistance Bed Mobility: Rolling;Sidelying to Sit Rolling: Min assist Sidelying to sit: Min assist       General bed mobility comments: cues  to roll, use of rail, Push self to sitting upright    Transfers Overall transfer level: Needs assistance Equipment used: Rolling walker (2 wheels) Transfers: Sit to/from Stand;Bed to chair/wheelchair/BSC Sit to Stand: Mod assist;+2 physical assistance           General transfer comment: multimodal cues  to rise, support on RW, Multimodal cues to take steps to recliner, required multiple cues to get infront of recliner. Cues to reach back to recliner.      Balance Overall balance assessment: Needs assistance Sitting-balance support: Feet supported;Bilateral upper extremity supported Sitting balance-Leahy Scale: Fair     Standing balance support: During functional activity;Bilateral upper extremity supported;Reliant on assistive device for balance Standing balance-Leahy Scale: Poor                             ADL either performed or assessed with clinical judgement   ADL Overall ADL's : Needs assistance/impaired Eating/Feeding: Set up;Sitting   Grooming: Oral care;Set up;Sitting Grooming Details (indicate cue type and reason): in recliner Upper Body Bathing: Minimal assistance;Sitting   Lower Body Bathing: Sit to/from stand;Sitting/lateral leans;Moderate assistance   Upper Body Dressing : Sitting;Minimal assistance   Lower Body Dressing: Sit to/from stand;Sitting/lateral leans;Moderate assistance   Toilet Transfer: +2 for physical assistance;+2 for safety/equipment;Moderate assistance Toilet Transfer Details (indicate cue type and reason): simulated to recliner in room with patient attempting to sit prior to reaching chair. Toileting- Clothing Manipulation and Hygiene: Maximal  assistance;Sit to/from stand       Functional mobility during ADLs: +2 for  physical assistance;+2 for safety/equipment       Vision Patient Visual Report: No change from baseline       Perception     Praxis      Pertinent Vitals/Pain Pain Assessment: Faces Faces Pain Scale: Hurts a little bit Pain Location: bottom Pain Descriptors / Indicators: Discomfort Pain Intervention(s): Repositioned     Hand Dominance Right   Extremity/Trunk Assessment Upper Extremity Assessment Upper Extremity Assessment: RUE deficits/detail RUE Deficits / Details: patient was able to lift UE about 30 degrees of flexion with reports of pain in shoulder. patient reported having fallen 4 years prior with this being result   Lower Extremity Assessment Lower Extremity Assessment: Defer to PT evaluation   Cervical / Trunk Assessment Cervical / Trunk Assessment: Normal   Communication Communication Communication: No difficulties   Cognition Arousal/Alertness: Awake/alert Behavior During Therapy: WFL for tasks assessed/performed Overall Cognitive Status: Within Functional Limits for tasks assessed                                       General Comments       Exercises     Shoulder Instructions      Home Living Family/patient expects to be discharged to:: Private residence Living Arrangements: Children;Other relatives Available Help at Discharge: Family;Available PRN/intermittently (go to school and work) Type of Home: Education officer, community of Steps: 3 Entrance Stairs-Rails: Can reach both Home Layout: Able to live on main level with bedroom/bathroom;Two level     Bathroom Shower/Tub: Chief Strategy Officer: Rollator (4 wheels)   Additional Comments: uses hand rails only if feeling more tired.      Prior Functioning/Environment Prior Level of Function : Independent/Modified Independent                        OT Problem List: Decreased strength;Decreased activity tolerance;Impaired balance (sitting  and/or standing);Decreased safety awareness;Impaired UE functional use;Decreased knowledge of use of DME or AE;Decreased knowledge of precautions      OT Treatment/Interventions: Self-care/ADL training;Therapeutic exercise;Energy conservation;DME and/or AE instruction;Therapeutic activities;Balance training;Patient/family education    OT Goals(Current goals can be found in the care plan section) Acute Rehab OT Goals OT Goal Formulation: All assessment and education complete, DC therapy Time For Goal Achievement: 05/12/21 Potential to Achieve Goals: Fair  OT Frequency: Min 2X/week   Barriers to D/C: Decreased caregiver support  patient reported family works/ goes to school during the day.       Co-evaluation   Reason for Co-Treatment: For patient/therapist safety PT goals addressed during session: Mobility/safety with mobility OT goals addressed during session: ADL's and self-care      AM-PAC OT "6 Clicks" Daily Activity     Outcome Measure Help from another person eating meals?: A Little Help from another person taking care of personal grooming?: A Little Help from another person toileting, which includes using toliet, bedpan, or urinal?: A Lot Help from another person bathing (including washing, rinsing, drying)?: A Lot Help from another person to put on and taking off regular upper body clothing?: A Lot Help from another person to put on and taking off regular lower body clothing?: A Lot 6 Click Score: 14   End of Session Equipment Utilized During Treatment: Gait  belt;Rolling walker (2 wheels) Nurse Communication: Mobility status  Activity Tolerance: Patient tolerated treatment well Patient left: in chair;with call bell/phone within reach;with chair alarm set  OT Visit Diagnosis: Unsteadiness on feet (R26.81);Muscle weakness (generalized) (M62.81)                Time: 2376-2831 OT Time Calculation (min): 25 min Charges:  OT General Charges $OT Visit: 1 Visit OT  Evaluation $OT Eval Low Complexity: 1 Low  Jackelyn Poling OTR/L, MS Acute Rehabilitation Department Office# [phone] Pager# [phone]   Marcellina Millin 04/28/2021, 1:09 PM

## 2021-04-28 NOTE — Evaluation (Signed)
Physical Therapy Evaluation Patient Details Name: [name] MRN: [id number] DOB: [birth year] Today's Date: 04/28/2021  History of Present Illness  Patient is a 80 y.o. female with PMH: type 2 diabetes, hypertension and CKD stage III. She was brought to ED on 12/23 when daughter noted patient to be having change in mental status with patient being confused, had stool incontinence. In ED, UTI was revealed as per results from UA, patient having high fever and acute on chronic renal insufficiency. Blood cultures were positive for E.coli.  Clinical Impression   The patient required mod assistance for bed  mobility, 2 assist to stand and take  steps to recliner.  Patient resides with multiple family members and plans to return home. Pt admitted with above diagnosis.  Pt currently with functional limitations due to the deficits listed below (see PT Problem List). Pt will benefit from skilled PT to increase their independence and safety with mobility to allow discharge to the venue listed below.          Recommendations for follow up therapy are one component of a multi-disciplinary discharge planning process, led by the attending physician.  Recommendations may be updated based on patient status, additional functional criteria and insurance authorization.  Follow Up Recommendations Home health PT    Assistance Recommended at Discharge Frequent or constant Supervision/Assistance  Functional Status Assessment Patient has had a recent decline in their functional status and demonstrates the ability to make significant improvements in function in a reasonable and predictable amount of time.  Equipment Recommendations  None recommended by PT    Recommendations for Other Services       Precautions / Restrictions Precautions Precautions: Fall      Mobility  Bed Mobility Overal bed mobility: Needs Assistance Bed Mobility: Rolling;Sidelying to Sit Rolling: Min assist Sidelying to sit: Min  assist       General bed mobility comments: cues  to roll, use of rail, Push self to sitting upright    Transfers Overall transfer level: Needs assistance Equipment used: Rolling walker (2 wheels) Transfers: Sit to/from Stand;Bed to chair/wheelchair/BSC Sit to Stand: Mod assist;+2 physical assistance           General transfer comment: multimodal cues  to rise, support on RW, Multimodal cues to take steps to recliner, required multiple cues to get infront of recliner. Cues to reach back to recliner.    Ambulation/Gait                  Stairs            Wheelchair Mobility    Modified Rankin (Stroke Patients Only)       Balance Overall balance assessment: Needs assistance Sitting-balance support: Feet supported;Bilateral upper extremity supported Sitting balance-Leahy Scale: Fair     Standing balance support: During functional activity;Bilateral upper extremity supported;Reliant on assistive device for balance Standing balance-Leahy Scale: Poor                               Pertinent Vitals/Pain Pain Assessment: Faces Faces Pain Scale: Hurts a little bit Pain Location: bottom Pain Descriptors / Indicators: Discomfort Pain Intervention(s): Repositioned    Home Living Family/patient expects to be discharged to:: Private residence Living Arrangements: Children;Other relatives Available Help at Discharge: Family;Available PRN/intermittently (go to school and work) Type of Home: House   Entrance Stairs-Rails: Can reach both Technical brewer of Steps: 3   Home Layout: Able to live on  main level with bedroom/bathroom;Two level Home Equipment: Rollator (4 wheels) Additional Comments: uses hand rails only if feeling more tired.    Prior Function Prior Level of Function : Independent/Modified Independent                     Hand Dominance   Dominant Hand: Right    Extremity/Trunk Assessment   Upper Extremity  Assessment Upper Extremity Assessment: Defer to OT evaluation    Lower Extremity Assessment Lower Extremity Assessment: Generalized weakness    Cervical / Trunk Assessment Cervical / Trunk Assessment: Normal  Communication   Communication: No difficulties  Cognition Arousal/Alertness: Awake/alert Behavior During Therapy: WFL for tasks assessed/performed Overall Cognitive Status: Within Functional Limits for tasks assessed                                          General Comments      Exercises     Assessment/Plan    PT Assessment Patient needs continued PT services  PT Problem List Decreased strength;Decreased mobility;Decreased safety awareness;Decreased activity tolerance;Decreased knowledge of precautions;Decreased balance;Decreased knowledge of use of DME;Pain       PT Treatment Interventions DME instruction;Therapeutic activities;Gait training;Therapeutic exercise;Patient/family education;Functional mobility training    PT Goals (Current goals can be found in the Care Plan section)  Acute Rehab PT Goals Patient Stated Goal: to go home PT Goal Formulation: With patient Time For Goal Achievement: 05/12/21 Potential to Achieve Goals: Good    Frequency Min 3X/week   Barriers to discharge        Co-evaluation PT/OT/SLP Co-Evaluation/Treatment: Yes Reason for Co-Treatment: For patient/therapist safety PT goals addressed during session: Mobility/safety with mobility OT goals addressed during session: ADL's and self-care       AM-PAC PT "6 Clicks" Mobility  Outcome Measure Help needed turning from your back to your side while in a flat bed without using bedrails?: A Lot Help needed moving from lying on your back to sitting on the side of a flat bed without using bedrails?: A Lot Help needed moving to and from a bed to a chair (including a wheelchair)?: A Lot Help needed standing up from a chair using your arms (e.g., wheelchair or bedside  chair)?: A Lot Help needed to walk in hospital room?: Total Help needed climbing 3-5 steps with a railing? : Total 6 Click Score: 10    End of Session Equipment Utilized During Treatment: Gait belt Activity Tolerance: Patient limited by fatigue;Patient tolerated treatment well Patient left: in chair;with call bell/phone within reach;with chair alarm set Nurse Communication: Mobility status PT Visit Diagnosis: Unsteadiness on feet (R26.81);Muscle weakness (generalized) (M62.81)    Time: 1950-9326 PT Time Calculation (min) (ACUTE ONLY): 29 min   Charges:   PT Evaluation $PT Eval Low Complexity: [place] [facility] Acute Rehabilitation Services Pager [phone] Office [phone]   Claretha Cooper 04/28/2021, 12:53 PM

## 2021-04-29 DIAGNOSIS — A4151 Sepsis due to Escherichia coli [E. coli]: Secondary | ICD-10-CM | POA: Diagnosis not present

## 2021-04-29 DIAGNOSIS — N179 Acute kidney failure, unspecified: Secondary | ICD-10-CM | POA: Diagnosis not present

## 2021-04-29 DIAGNOSIS — N1832 Chronic kidney disease, stage 3b: Secondary | ICD-10-CM | POA: Diagnosis not present

## 2021-04-29 DIAGNOSIS — R6520 Severe sepsis without septic shock: Secondary | ICD-10-CM | POA: Diagnosis not present

## 2021-04-29 LAB — COMPREHENSIVE METABOLIC PANEL
ALT: 38 U/L (ref 0–44)
AST: 37 U/L (ref 15–41)
Albumin: 2.6 g/dL — ABNORMAL LOW (ref 3.5–5.0)
Alkaline Phosphatase: 71 U/L (ref 38–126)
Anion gap: 8 (ref 5–15)
BUN: 63 mg/dL — ABNORMAL HIGH (ref 8–23)
CO2: 20 mmol/L — ABNORMAL LOW (ref 22–32)
Calcium: 8.3 mg/dL — ABNORMAL LOW (ref 8.9–10.3)
Chloride: 109 mmol/L (ref 98–111)
Creatinine, Ser: 3.28 mg/dL — ABNORMAL HIGH (ref 0.44–1.00)
GFR, Estimated: 14 mL/min — ABNORMAL LOW (ref >60)
Glucose, Bld: 126 mg/dL — ABNORMAL HIGH (ref 70–99)
Potassium: 3.6 mmol/L (ref 3.5–5.1)
Sodium: 137 mmol/L (ref 135–145)
Total Bilirubin: 0.8 mg/dL (ref 0.3–1.2)
Total Protein: 6 g/dL — ABNORMAL LOW (ref 6.5–8.1)

## 2021-04-29 LAB — CBC WITH DIFFERENTIAL/PLATELET
Abs Immature Granulocytes: 1.52 10*3/uL — ABNORMAL HIGH (ref 0.00–0.07)
Basophils Absolute: 0.1 10*3/uL (ref 0.0–0.1)
Basophils Relative: 1 %
Eosinophils Absolute: 0.3 10*3/uL (ref 0.0–0.5)
Eosinophils Relative: 2 %
HCT: 26.3 % — ABNORMAL LOW (ref 36.0–46.0)
Hemoglobin: 8.7 g/dL — ABNORMAL LOW (ref 12.0–15.0)
Immature Granulocytes: 8 %
Lymphocytes Relative: 8 %
Lymphs Abs: 1.5 10*3/uL (ref 0.7–4.0)
MCH: 29.4 pg (ref 26.0–34.0)
MCHC: 33.1 g/dL (ref 30.0–36.0)
MCV: 88.9 fL (ref 80.0–100.0)
Monocytes Absolute: 1.8 10*3/uL — ABNORMAL HIGH (ref 0.1–1.0)
Monocytes Relative: 10 %
Neutro Abs: 13 10*3/uL — ABNORMAL HIGH (ref 1.7–7.7)
Neutrophils Relative %: 71 %
Platelets: 83 10*3/uL — ABNORMAL LOW (ref 150–400)
RBC: 2.96 MIL/uL — ABNORMAL LOW (ref 3.87–5.11)
RDW: 15.1 % (ref 11.5–15.5)
WBC: 18.3 10*3/uL — ABNORMAL HIGH (ref 4.0–10.5)
nRBC: 0.4 % — ABNORMAL HIGH (ref 0.0–0.2)

## 2021-04-29 LAB — GLUCOSE, CAPILLARY
Glucose-Capillary: 109 mg/dL — ABNORMAL HIGH (ref 70–99)
Glucose-Capillary: 110 mg/dL — ABNORMAL HIGH (ref 70–99)
Glucose-Capillary: 146 mg/dL — ABNORMAL HIGH (ref 70–99)
Glucose-Capillary: 150 mg/dL — ABNORMAL HIGH (ref 70–99)
Glucose-Capillary: 204 mg/dL — ABNORMAL HIGH (ref 70–99)
Glucose-Capillary: 65 mg/dL — ABNORMAL LOW (ref 70–99)

## 2021-04-29 LAB — PHOSPHORUS: Phosphorus: 2.7 mg/dL (ref 2.5–4.6)

## 2021-04-29 LAB — HAPTOGLOBIN: Haptoglobin: 288 mg/dL (ref 42–346)

## 2021-04-29 LAB — MAGNESIUM: Magnesium: 2.1 mg/dL (ref 1.7–2.4)

## 2021-04-29 MED ORDER — CEPHALEXIN 500 MG PO CAPS
500.0000 mg | ORAL_CAPSULE | Freq: Two times a day (BID) | ORAL | Status: DC
  Administered 2021-04-29 – 2021-04-30 (×3): 500 mg via ORAL
  Filled 2021-04-29 (×3): qty 1

## 2021-04-29 MED ORDER — SODIUM CHLORIDE 0.9 % IV SOLN: INTRAVENOUS | Status: DC

## 2021-04-29 NOTE — Progress Notes (Signed)
[facility] Progress Note  Subjective: 1000 cc UOP last 24 hrs, BUN 63, creat 3.28  Vitals:   04/28/21 0800 04/28/21 1200 04/28/21 2042 04/29/21 0454  BP: 125/74 (!) 113/34 (!) 121/56 127/63  Pulse: 77 74 78 79  Resp: '16 18 18 18  ' Temp: 98.2 F (36.8 C) 98 F (36.7 C) 98.1 F (36.7 C) 97.7 F (36.5 C)  TempSrc: Oral Oral Oral Oral  SpO2: 99% 99% 100% 100%  Weight:    83.4 kg  Height:        Exam: Gen alert, no distress  No jvd or bruits Chest clear bilat to bases Abd soft ntnd no mass or ascites +bs GU foley in place, clear light yellow urine in tube MS no joint effusions or deformity Ext no LE or UE edema Neuro is alert, Ox 3 , nf      Home meds include - cardizem cd 180, lasix 20 bid, hydralazine 50 tid, isordil tid, pravachol, eliquis, insulin, prns     Date                           Creat               eGFR   Jan 2020                   1.88   Dec 2020                   1.36   Jan 2021                   1.17 August 2019                  1.51   March- May 2022      1.28- 1.59        32- 43 ml/min, IIIb   July- Oct 2022           1.54- 1.95        27-42 ml/min, IIIb   Dec 23                       2.44                 20           Dec 24                       3.08   Dec 25                       3.69   Apr 27, 2021             4.02                 11       UA 12/23 - cloudy, large Hb, mod LE, >300 protein, rare bact, >50 rbc/ wbc's    UCx 12/23 +EColi > 100K    ECHO  May 2022 - LVEF 55- 60%, mod-severe RV dysfunction w/ severe pHTN    UNa 12, UCr 96     CT abd 12/24 - Kidneys: Right adrenal gland appears normal. No hydronephrosis. No nephrolithiasis. No obstructive ureteral or bladder calculi identified. Calcifications along the bilateral ureters appear to be associated with the gonadal veins likely reflecting phleboliths. Urinary bladder is decompressed around a  Foley catheter.     CXR 12/23 - IMPRESSION: No acute cardiopulmonary abnormality    Renal US - 10-12 cm kidneys w/o hydro        Assessment/ Plan: AKI on CKD 3b - b/l creatinine 1.5- 1.9 from mid 2022, eGFR 27-42 ml/min.  Here w/ AKI in setting of marked vol depletion and urosepsis. Creat 2.4 on admission 12/23 then worsened to peak 4.0. Urine lytes c/w prerenal. Suspected AKI d/t vol depletion and sepsis.  Re-bolused on 12/26 w/ 2L + IVF's and creat improving down to 3.5 yest and 3.2 today. Should cont to recover. Wean off IVF"s per pmd.  Will arrange renal f/u w/ [facility] to follow her for CKD. Will sign off.   Septic shock / urosepsis - +Ecoli in UCx. ^^WBC. Improving w/ IV abx AMS - improving HTN - home BP lowering meds on hold ^LFT's - per pmd Anemia - per pmd DM2  Diarrhea - improved PAF Thrombocytopenia - LDH wnl, hapto pending, plts up w/ rx of infection, doubt MAHA         Rob Ermie Glendenning 04/29/2021, 8:15 AM   Recent Labs  Lab 04/28/21 0257 04/29/21 0525  K 3.7 3.6  BUN 67* 63*  CREATININE 3.51* 3.28*  CALCIUM 7.8* 8.3*  PHOS 2.8 2.7  HGB 8.6* 8.7*    Inpatient medications:  Chlorhexidine Gluconate Cloth  6 each Topical Q0600   dicyclomine  10 mg Oral TID AC & HS   feeding supplement  237 mL Oral TID BM   insulin aspart  0-15 Units Subcutaneous TID WC   insulin aspart  0-5 Units Subcutaneous QHS   insulin aspart  3 Units Subcutaneous TID WC   insulin glargine-yfgn  8 Units Subcutaneous QHS   mouth rinse  15 mL Mouth Rinse BID   multivitamin with minerals  1 tablet Oral Daily   pantoprazole (PROTONIX) IV  40 mg Intravenous QHS    ampicillin-sulbactam (UNASYN) IV 3 g (04/29/21 0155)   lactated ringers 75 mL/hr at 04/28/21 2218   acetaminophen **OR** acetaminophen, fentaNYL (SUBLIMAZE) injection, ondansetron **OR** ondansetron (ZOFRAN) IV, oxyCODONE

## 2021-04-29 NOTE — Care Management Important Message (Signed)
Important Message  Patient Details IM Letter placed in Patients room. Name: [name] MRN: [id number] Date of Birth: Feb 25, 1941   Medicare Important Message Given:  Yes     Kerin Salen 04/29/2021, 11:36 AM

## 2021-04-29 NOTE — TOC Initial Note (Signed)
Transition of Care [facility]) - Initial/Assessment Note    Patient Details  Name: [name] MRN: [id number] Date of Birth: 02-24-1941  Transition of Care [facility]) CM/SW Contact:    Leeroy Cha, RN Phone Number: 04/29/2021, 7:46 AM  Clinical Narrative:                  Transition of Care [facility]) Screening Note   Patient Details  Name: [name] Date of Birth: 02/07/1941   Transition of Care [facility]) CM/SW Contact:    Leeroy Cha, RN Phone Number: 04/29/2021, 7:46 AM    Transition of Care Department [facility]) has reviewed patient and no TOC needs have been identified at this time. We will continue to monitor patient advancement through interdisciplinary progression rounds. If new patient transition needs arise, please place a TOC consult.    Expected Discharge Plan: Home/Self Care Barriers to Discharge: Continued Medical Work up   Patient Goals and CMS Choice Patient states their goals for this hospitalization and ongoing recovery are:: to go home      Expected Discharge Plan and Services Expected Discharge Plan: Home/Self Care   Discharge Planning Services: CM Consult   Living arrangements for the past 2 months: Single Family Home                                      Prior Living Arrangements/Services Living arrangements for the past 2 months: Single Family Home Lives with:: Self Patient language and need for interpreter reviewed:: Yes Do you feel safe going back to the place where you live?: Yes      Need for Family Participation in Patient Care: No (Comment)     Criminal Activity/Legal Involvement Pertinent to Current Situation/Hospitalization: No - Comment as needed  Activities of Daily Living Home Assistive Devices/Equipment: Other (Comment) (UTA) ADL Screening (condition at time of admission) Patient's cognitive ability adequate to safely complete daily activities?: No Is the patient deaf or have difficulty hearing?: No Does the  patient have difficulty seeing, even when wearing glasses/contacts?: No Does the patient have difficulty concentrating, remembering, or making decisions?: Yes Patient able to express need for assistance with ADLs?: No Does the patient have difficulty dressing or bathing?: Yes Independently performs ADLs?: No Does the patient have difficulty walking or climbing stairs?: Yes Weakness of Legs: Both Weakness of Arms/Hands: Both  Permission Sought/Granted                  Emotional Assessment Appearance:: Appears stated age Attitude/Demeanor/Rapport: Engaged Affect (typically observed): Calm Orientation: : Oriented to Place, Oriented to Self, Oriented to Situation, Oriented to  Time Alcohol / Substance Use: Not Applicable Psych Involvement: No (comment)  Admission diagnosis:  Confusion [R41.0] Sepsis with acute organ dysfunction without septic shock ([place]) [A41.9, R65.20] Sepsis with acute renal failure without septic shock, due to unspecified organism, unspecified acute renal failure type ([place]) [A41.9, R65.20, N17.9] Bacteremia [R78.81] Patient Active Problem List   Diagnosis Date Noted   Malnutrition of moderate degree 04/27/2021   Confusion    Wheezing    Bacteremia 04/25/2021   Sepsis with acute organ dysfunction without septic shock ([place]) 04/24/2021   Acute kidney injury superimposed on chronic kidney disease ([place]) 04/24/2021   Acute cystitis without hematuria 04/24/2021   Persistent atrial fibrillation ([place]) 09/06/2020   DM (diabetes mellitus), type 2 with renal complications ([place]) 45/80/9983   Prolonged QT interval 09/06/2020  Abnormal gait 06/18/2020   Heart failure ([place]) 06/18/2020   Hyperglycemia due to type 2 diabetes mellitus ([place]) 06/18/2020   Hypertensive heart disease without congestive heart failure 06/18/2020   Mixed incontinence 06/18/2020   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[place]) 06/18/2020   Pure  hypercholesterolemia 06/18/2020   Stage 3 chronic kidney disease ([place]) 06/18/2020   Type 2 diabetes mellitus without complications ([place]) 70/03/33   Vitamin D deficiency 06/18/2020   Essential hypertension 05/08/2019   Hyperlipidemia 05/08/2019   Bilateral lower extremity edema 05/08/2019   Failed hearing screening 02/02/2018   Impacted cerumen of right ear 02/02/2018   Otalgia, right 02/02/2018   PCP:  Trey Sailors, PA Pharmacy:   [facility] [place], [place] - [address] AT [address] [place] [address] Phone: [phone] Fax: [phone]     Social Determinants of Health ([place]) Interventions    Readmission Risk Interventions No flowsheet data found.

## 2021-04-29 NOTE — Progress Notes (Signed)
PROGRESS NOTE    Ytzel Gubler   DJT:[id number]  DOB: 1941-01-01  DOA: 04/24/2021 PCP: Trey Sailors, PA   Brief Narrative:  [name] with diabetes mellitus type 2, CKD stage IIIb, hypertension, persistent A. fib presented to the hospital for confusion and incontinence of stool.  In the ED the patient was noted to have a temperature of 103 degrees, lactic acid of 3.1 and a UA that was consistent with a UTI.  WBC count was 9, BUN 33 and creatinine 2.4.  She was given IV fluids and started on ceftriaxone.  She later was found to have E. coli UTI and bacteremia.  Her incontinence and diarrhea resolved spontaneously.  Her renal function progressively got worse and was treated with IV fluid boluses on continuous fluids.  A nephrology consult was requested.   Subjective: She has no complaints today.  She is attempting to eat and drink.    Assessment & Plan:   Principal Problem:   Sepsis with acute organ dysfunction without septic shock (HCC) Secondary to E. coli UTI and bacteremia - Sepsis has resolved - Per sensitivities, we can switch Unasyn to Keflex-plan for 7 to 10-day course  Active Problems:   Acute kidney injury superimposed on chronic kidney disease is 3B - Prerenal - she drank less than 300 cc of fluid yesterday - We will need to continue IV fluids today while ensuring that she has adequate oral intake before we decide to DC her home - She takes Lasix at home which is on hold -Echocardiogram from 09/06/2020 revealed an EF of 55 to 30%-SP diastolic function could not be evaluated-RV systolic function was moderately reduced with severely elevated pulmonary artery pressures    Essential hypertension -Holding amlodipine, furosemide, Benicar, Cardizem, isosorbide dinitrate, hydralazine due to sepsis-BP currently not elevated enough to resume these meds  Paroxysmal atrial fibrillation ([place]) -Mali Vasc 5-cardioverted in [place] longer on anticoagulation per  cardiology (Dr. Einar Gip) -Currently rate controlled in normal sinus rhythm    DM (diabetes mellitus), type 2 with renal complications (HCC) -Holding Amaryl and Humulin 70/30 - Continue sliding scale insulin and Semglee    Malnutrition of moderate degree Body mass index is 31.56 kg/m.   Time spent in minutes: 35 DVT prophylaxis: SCDs Start: 04/25/21 0152  Code Status: Full code Family Communication:  Level of Care: Level of care: Telemetry Disposition Plan:  Status is: Inpatient  Remains inpatient appropriate because: Continuing IV fluids due to poor oral intake      Consultants:  Nephrology Pulmonary critical care Procedures:  None Antimicrobials:  Anti-infectives (From admission, onward)    Start     Dose/Rate Route Frequency Ordered Stop   04/27/21 1345  Ampicillin-Sulbactam (UNASYN) 3 g in sodium chloride 0.9 % 100 mL IVPB        3 g 200 mL/hr over 30 Minutes Intravenous Every 12 hours 04/27/21 1254 04/30/21 2359   04/25/21 1800  cefTRIAXone (ROCEPHIN) 2 g in sodium chloride 0.9 % 100 mL IVPB  Status:  Discontinued        2 g 200 mL/hr over 30 Minutes Intravenous Every 24 hours 04/25/21 1012 04/27/21 1254   04/24/21 2030  cefTRIAXone (ROCEPHIN) 2 g in sodium chloride 0.9 % 100 mL IVPB  Status:  Discontinued        2 g 200 mL/hr over 30 Minutes Intravenous Every 24 hours 04/24/21 2017 04/25/21 1012        Objective: Vitals:   04/28/21 0800 04/28/21 1200 04/28/21 2042 04/29/21 0454  BP: 125/74 (!) 113/34 (!) 121/56 127/63  Pulse: 77 74 78 79  Resp: 16 18 18 18   Temp: 98.2 F (36.8 C) 98 F (36.7 C) 98.1 F (36.7 C) 97.7 F (36.5 C)  TempSrc: Oral Oral Oral Oral  SpO2: 99% 99% 100% 100%  Weight:    83.4 kg  Height:        Intake/Output Summary (Last 24 hours) at 04/29/2021 1114 Last data filed at 04/29/2021 0800 Gross per 24 hour  Intake 866.79 ml  Output 700 ml  Net 166.79 ml   Filed Weights   04/27/21 1640 04/28/21 0500 04/29/21 0454   Weight: 75.5 kg 78.1 kg 83.4 kg    Examination: General exam: Appears comfortable  HEENT: PERRLA, oral mucosa moist, no sclera icterus or thrush Respiratory system: Clear to auscultation. Respiratory effort normal. Cardiovascular system: S1 & S2 heard, RRR.   Gastrointestinal system: Abdomen soft, non-tender, nondistended. Normal bowel sounds. Central nervous system: Alert and oriented. No focal neurological deficits. Extremities: No cyanosis, clubbing or edema Skin: No rashes or ulcers Psychiatry:  Mood & affect appropriate.     Data Reviewed: I have personally reviewed following labs and imaging studies  CBC: Recent Labs  Lab 04/25/21 0413 04/26/21 0303 04/27/21 0745 04/28/21 0257 04/29/21 0525  WBC 19.2* 17.1* 17.9* 14.1* 18.3*  NEUTROABS 14.1* 12.0* 15.3* 11.3* 13.0*  HGB 9.9* 9.5* 8.5* 8.6* 8.7*  HCT 30.9* 29.1* 26.1* 26.6* 26.3*  MCV 93.4 89.5 90.3 89.0 88.9  PLT 67* 66* 75* 68* 83*   Basic Metabolic Panel: Recent Labs  Lab 04/25/21 2058 04/26/21 0303 04/27/21 0745 04/28/21 0257 04/29/21 0525  NA 138 139 138 135 137  K 4.1 3.7 3.5 3.7 3.6  CL 105 107 108 105 109  CO2 21* 22 21* 19* 20*  GLUCOSE 159* 132* 122* 380* 126*  BUN 48* 51* 61* 67* 63*  CREATININE 3.50* 3.69* 4.02* 3.51* 3.28*  CALCIUM 7.8* 7.8* 7.8* 7.8* 8.3*  MG  --  1.8  --  1.9 2.1  PHOS  --  3.8  --  2.8 2.7   GFR: Estimated Creatinine Clearance: 14.5 mL/min (A) (by C-G formula based on SCr of 3.28 mg/dL (H)). Liver Function Tests: Recent Labs  Lab 04/25/21 0413 04/26/21 0303 04/27/21 0745 04/28/21 0257 04/29/21 0525  AST 63* 106* 78* 55* 37  ALT 31 49* 54* 48* 38  ALKPHOS 62 63 65 69 71  BILITOT 1.0 1.0 0.8 0.6 0.8  PROT 6.5 6.5 6.3* 5.7* 6.0*  ALBUMIN 3.0* 3.0* 2.7* 2.5* 2.6*   No results for input(s): LIPASE, AMYLASE in the last 168 hours. No results for input(s): AMMONIA in the last 168 hours. Coagulation Profile: Recent Labs  Lab 04/24/21 1821  INR 1.3*   Cardiac  Enzymes: No results for input(s): CKTOTAL, CKMB, CKMBINDEX, TROPONINI in the last 168 hours. BNP (last 3 results) No results for input(s): PROBNP in the last 8760 hours. HbA1C: No results for input(s): HGBA1C in the last 72 hours. CBG: Recent Labs  Lab 04/28/21 0722 04/28/21 1134 04/28/21 1701 04/28/21 2045 04/29/21 0822  GLUCAP 374* 183* 157* 134* 150*   Lipid Profile: No results for input(s): CHOL, HDL, LDLCALC, TRIG, CHOLHDL, LDLDIRECT in the last 72 hours. Thyroid Function Tests: No results for input(s): TSH, T4TOTAL, FREET4, T3FREE, THYROIDAB in the last 72 hours. Anemia Panel: No results for input(s): VITAMINB12, FOLATE, FERRITIN, TIBC, IRON, RETICCTPCT in the last 72 hours. Urine analysis:    Component Value Date/Time   COLORURINE YELLOW  04/24/2021 1835   APPEARANCEUR CLOUDY (A) 04/24/2021 1835   LABSPEC 1.020 04/24/2021 1835   PHURINE 6.0 04/24/2021 [place] 04/24/2021 1835   HGBUR LARGE (A) 04/24/2021 [place] 04/24/2021 [place] 04/24/2021 1835   PROTEINUR >300 (A) 04/24/2021 1835   NITRITE POSITIVE (A) 04/24/2021 1835   LEUKOCYTESUR MODERATE (A) 04/24/2021 1835   Sepsis Labs: @LABRCNTIP (procalcitonin:4,lacticidven:4) ) Recent Results (from the past 240 hour(s))  Blood Culture (routine x 2)     Status: Abnormal   Collection Time: 04/24/21  6:21 PM   Specimen: BLOOD  Result Value Ref Range Status   Specimen Description   Final    BLOOD LEFT ANTECUBITAL Performed at [facility], [place] [address], [place], [place] [address]    Special Requests   Final    BOTTLES DRAWN AEROBIC AND ANAEROBIC Blood Culture results may not be optimal due to an excessive volume of blood received in culture bottles Performed at [place] [address] [place], [place] [address]    Culture  Setup Time   Final    GRAM NEGATIVE RODS IN BOTH AEROBIC AND ANAEROBIC BOTTLES CRITICAL RESULT  CALLED TO, READ BACK BY AND VERIFIED WITH: D,WOFFORD PHARMD @1005  04/25/21 EB Performed at [place] [address] [place], [place] [address]    Culture ESCHERICHIA COLI (A)  Final   Report Status 04/27/2021 FINAL  Final   Organism ID, Bacteria ESCHERICHIA COLI  Final      Susceptibility   Escherichia coli - MIC*    AMPICILLIN <=2 SENSITIVE Sensitive     CEFAZOLIN <=4 SENSITIVE Sensitive     CEFEPIME <=0.12 SENSITIVE Sensitive     CEFTAZIDIME <=1 SENSITIVE Sensitive     CEFTRIAXONE <=0.25 SENSITIVE Sensitive     CIPROFLOXACIN <=0.25 SENSITIVE Sensitive     GENTAMICIN <=1 SENSITIVE Sensitive     IMIPENEM <=0.25 SENSITIVE Sensitive     TRIMETH/SULFA <=20 SENSITIVE Sensitive     AMPICILLIN/SULBACTAM <=2 SENSITIVE Sensitive     PIP/TAZO <=4 SENSITIVE Sensitive     * ESCHERICHIA COLI  Blood Culture ID Panel (Reflexed)     Status: Abnormal   Collection Time: 04/24/21  6:21 PM  Result Value Ref Range Status   Enterococcus faecalis NOT DETECTED NOT DETECTED Final   Enterococcus Faecium NOT DETECTED NOT DETECTED Final   Listeria monocytogenes NOT DETECTED NOT DETECTED Final   Staphylococcus species NOT DETECTED NOT DETECTED Final   Staphylococcus aureus (BCID) NOT DETECTED NOT DETECTED Final   Staphylococcus epidermidis NOT DETECTED NOT DETECTED Final   Staphylococcus lugdunensis NOT DETECTED NOT DETECTED Final   Streptococcus species NOT DETECTED NOT DETECTED Final   Streptococcus agalactiae NOT DETECTED NOT DETECTED Final   Streptococcus pneumoniae NOT DETECTED NOT DETECTED Final   Streptococcus pyogenes NOT DETECTED NOT DETECTED Final   A.calcoaceticus-baumannii NOT DETECTED NOT DETECTED Final   Bacteroides fragilis NOT DETECTED NOT DETECTED Final   Enterobacterales DETECTED (A) NOT DETECTED Final    Comment: Enterobacterales represent a large order of gram negative bacteria, not a single organism. CRITICAL RESULT CALLED TO, READ BACK BY AND VERIFIED WITH: D,WOFFORD  PHARMD @1005  04/25/21 EB    Enterobacter cloacae complex NOT DETECTED NOT DETECTED Final   Escherichia coli DETECTED (A) NOT DETECTED Final    Comment: CRITICAL RESULT CALLED TO, READ BACK BY AND VERIFIED WITH: D,WOFFORD PHARMD @1005  04/25/21 EB    Klebsiella aerogenes NOT DETECTED NOT DETECTED Final  Klebsiella oxytoca NOT DETECTED NOT DETECTED Final   Klebsiella pneumoniae NOT DETECTED NOT DETECTED Final   Proteus species NOT DETECTED NOT DETECTED Final   Salmonella species NOT DETECTED NOT DETECTED Final   Serratia marcescens NOT DETECTED NOT DETECTED Final   Haemophilus influenzae NOT DETECTED NOT DETECTED Final   Neisseria meningitidis NOT DETECTED NOT DETECTED Final   Pseudomonas aeruginosa NOT DETECTED NOT DETECTED Final   Stenotrophomonas maltophilia NOT DETECTED NOT DETECTED Final   Candida albicans NOT DETECTED NOT DETECTED Final   Candida auris NOT DETECTED NOT DETECTED Final   Candida glabrata NOT DETECTED NOT DETECTED Final   Candida krusei NOT DETECTED NOT DETECTED Final   Candida parapsilosis NOT DETECTED NOT DETECTED Final   Candida tropicalis NOT DETECTED NOT DETECTED Final   Cryptococcus neoformans/gattii NOT DETECTED NOT DETECTED Final   CTX-M ESBL NOT DETECTED NOT DETECTED Final   Carbapenem resistance IMP NOT DETECTED NOT DETECTED Final   Carbapenem resistance KPC NOT DETECTED NOT DETECTED Final   Carbapenem resistance NDM NOT DETECTED NOT DETECTED Final   Carbapenem resist OXA 48 LIKE NOT DETECTED NOT DETECTED Final   Carbapenem resistance VIM NOT DETECTED NOT DETECTED Final    Comment: Performed at [facility], [address] [place],  [address]  Resp Panel by RT-PCR (Flu A&B, Covid) Nasopharyngeal Swab     Status: None   Collection Time: 04/24/21  6:24 PM   Specimen: Nasopharyngeal Swab; Nasopharyngeal(NP) swabs in vial transport medium  Result Value Ref Range Status   SARS Coronavirus 2 by RT PCR NEGATIVE NEGATIVE Final    Comment:  (NOTE) SARS-CoV-2 target nucleic acids are NOT DETECTED.  The SARS-CoV-2 RNA is generally detectable in upper respiratory specimens during the acute phase of infection. The lowest concentration of SARS-CoV-2 viral copies this assay can detect is 138 copies/mL. A negative result does not preclude SARS-Cov-2 infection and should not be used as the sole basis for treatment or other patient management decisions. A negative result may occur with  improper specimen collection/handling, submission of specimen other than nasopharyngeal swab, presence of viral mutation(s) within the areas targeted by this assay, and inadequate number of viral copies(<138 copies/mL). A negative result must be combined with clinical observations, patient history, and epidemiological information. The expected result is Negative.  Fact Sheet for Patients:  EntrepreneurPulse.com.au  Fact Sheet for Healthcare Providers:  IncredibleEmployment.be  This test is no t yet approved or cleared by the [place] FDA and  has been authorized for detection and/or diagnosis of SARS-CoV-2 by FDA under an Emergency Use Authorization (EUA). This EUA will remain  in effect (meaning this test can be used) for the duration of the COVID-19 declaration under Section 564(b)(1) of the Act, 21 U.S.C.section 360bbb-3(b)(1), unless the authorization is terminated  or revoked sooner.       Influenza A by PCR NEGATIVE NEGATIVE Final   Influenza B by PCR NEGATIVE NEGATIVE Final    Comment: (NOTE) The Xpert Xpress SARS-CoV-2/FLU/RSV plus assay is intended as an aid in the diagnosis of influenza from Nasopharyngeal swab specimens and should not be used as a sole basis for treatment. Nasal washings and aspirates are unacceptable for Xpert Xpress SARS-CoV-2/FLU/RSV testing.  Fact Sheet for Patients: EntrepreneurPulse.com.au  Fact Sheet for Healthcare  Providers: IncredibleEmployment.be  This test is not yet approved or cleared by the [place] FDA and has been authorized for detection and/or diagnosis of SARS-CoV-2 by FDA under an Emergency Use Authorization (EUA). This EUA will remain  in effect (meaning this test can be used) for the duration of the COVID-19 declaration under Section 564(b)(1) of the Act, 21 U.S.C. section 360bbb-3(b)(1), unless the authorization is terminated or revoked.  Performed at [facility], [place] [address], [place], [place] [address]   Urine Culture     Status: Abnormal   Collection Time: 04/24/21  6:35 PM   Specimen: In/Out Cath Urine  Result Value Ref Range Status   Specimen Description   Final    IN/OUT CATH URINE Performed at [place] [address], [place], [place] [address]    Special Requests   Final    NONE Performed at [facility], [place] [address] [place], [place] [address]    Culture >=100,000 COLONIES/mL ESCHERICHIA COLI (A)  Final   Report Status 04/27/2021 FINAL  Final   Organism ID, Bacteria ESCHERICHIA COLI (A)  Final      Susceptibility   Escherichia coli - MIC*    AMPICILLIN <=2 SENSITIVE Sensitive     CEFAZOLIN <=4 SENSITIVE Sensitive     CEFEPIME <=0.12 SENSITIVE Sensitive     CEFTRIAXONE <=0.25 SENSITIVE Sensitive     CIPROFLOXACIN <=0.25 SENSITIVE Sensitive     GENTAMICIN <=1 SENSITIVE Sensitive     IMIPENEM <=0.25 SENSITIVE Sensitive     NITROFURANTOIN <=16 SENSITIVE Sensitive     TRIMETH/SULFA <=20 SENSITIVE Sensitive     AMPICILLIN/SULBACTAM <=2 SENSITIVE Sensitive     PIP/TAZO <=4 SENSITIVE Sensitive     * >=100,000 COLONIES/mL ESCHERICHIA COLI  Blood Culture (routine x 2)     Status: Abnormal   Collection Time: 04/24/21  8:31 PM   Specimen: BLOOD  Result Value Ref Range Status   Specimen Description   Final    BLOOD Performed at [place] [address] [place], [place] [address]    Special Requests   Final    BOTTLES DRAWN AEROBIC AND ANAEROBIC Blood Culture adequate volume Performed at [place] [address], [place], [place] [address]    Culture  Setup Time   Final    GRAM NEGATIVE RODS IN BOTH AEROBIC AND ANAEROBIC BOTTLES CRITICAL VALUE NOTED.  VALUE IS CONSISTENT WITH PREVIOUSLY REPORTED AND CALLED VALUE.    Culture (A)  Final    ESCHERICHIA COLI SUSCEPTIBILITIES PERFORMED ON PREVIOUS CULTURE WITHIN THE LAST 5 DAYS. Performed at [facility], [place] [address] [place], [place] [address]    Report Status 04/27/2021 FINAL  Final  MRSA Next Gen by PCR, Nasal     Status: None   Collection Time: 04/25/21  4:50 PM   Specimen: Nasal Mucosa; Nasal Swab  Result Value Ref Range Status   MRSA by PCR Next Gen NOT DETECTED NOT DETECTED Final    Comment: (NOTE) The GeneXpert MRSA Assay (FDA approved for NASAL specimens only), is one component of a comprehensive MRSA colonization surveillance program. It is not intended to diagnose MRSA infection nor to guide or monitor treatment for MRSA infections. Test performance is not FDA approved in patients less than 64 years old. Performed at [facility], [place] [address], [place], [place] [address]   C Difficile Quick Screen w PCR reflex     Status: None   Collection Time: 04/26/21  4:58 AM   Specimen: STOOL  Result Value Ref Range Status   C Diff antigen NEGATIVE NEGATIVE Final   C Diff toxin NEGATIVE NEGATIVE Final   C Diff interpretation No  C. difficile detected.  Final    Comment: Performed at [facility], [place] [address], [place], [place] [address]         Radiology Studies: US RENAL  Result Date: 04/27/2021 CLINICAL DATA:  Acute renal failure EXAM: RENAL / URINARY TRACT ULTRASOUND COMPLETE COMPARISON:  CT 04/25/2021 FINDINGS: Right Kidney: Renal measurements: 11.4 x 3.8 x 3.5 cm = volume: 79.8 mL. Cortex  slightly echogenic. No mass or hydronephrosis. Left Kidney: Renal measurements: 10.6 x 4.4 x 5.3 cm = volume: 128.4 mL. Cortex slightly echogenic. No mass or hydronephrosis. Bladder: Decompressed by Foley catheter. Other: Small amount of free fluid in the pelvis. Small right pleural effusion. Enlarged appearing liver. IMPRESSION: 1. Slightly echogenic kidneys consistent with medical renal disease. No hydronephrosis. 2. Small right pleural effusion and small amount of ascites in the pelvis. Electronically Signed   By: Donavan Foil M.D.   On: 04/27/2021 20:55   US Abdomen Limited RUQ (LIVER/GB)  Result Date: 04/28/2021 CLINICAL DATA:  Abnormal LFTs EXAM: ULTRASOUND ABDOMEN LIMITED RIGHT UPPER QUADRANT COMPARISON:  CT 04/25/2021 FINDINGS: Gallbladder: Gallbladder wall thickening up to 8.9 mm. Moderate pericholecystic fluid. Sonographer describes no sonographic Murphy's sign however. Layering echogenic material in the fundus possibly small stones or sludge. Common bile duct: Diameter: 9 mm Liver: No focal lesion identified. Within normal limits in parenchymal echogenicity. Portal vein is patent on color Doppler imaging with normal direction of blood flow towards the liver. Other: Small volume abdominal ascites. Right pleural effusion noted. IMPRESSION: 1. Nonspecific but marked gallbladder wall thickening, with layering small stones or sludge. 2. Dilated common bile duct. 3. Small volume abdominal ascites including pericholecystic fluid, and right pleural effusion incidentally noted. Electronically Signed   By: Lucrezia Europe M.D.   On: 04/28/2021 17:19      Scheduled Meds:  Chlorhexidine Gluconate Cloth  6 each Topical Q0600   dicyclomine  10 mg Oral TID AC & HS   feeding supplement  237 mL Oral TID BM   insulin aspart  0-15 Units Subcutaneous TID WC   insulin aspart  0-5 Units Subcutaneous QHS   insulin aspart  3 Units Subcutaneous TID WC   insulin glargine-yfgn  8 Units Subcutaneous QHS   mouth rinse   15 mL Mouth Rinse BID   multivitamin with minerals  1 tablet Oral Daily   pantoprazole (PROTONIX) IV  40 mg Intravenous QHS   Continuous Infusions:  ampicillin-sulbactam (UNASYN) IV 3 g (04/29/21 0155)     LOS: 5 days      Debbe Odea, MD [facility] Pager: [email or web address] 04/29/2021, 11:14 AM

## 2021-04-30 DIAGNOSIS — A4151 Sepsis due to Escherichia coli [E. coli]: Secondary | ICD-10-CM | POA: Diagnosis not present

## 2021-04-30 DIAGNOSIS — N179 Acute kidney failure, unspecified: Secondary | ICD-10-CM | POA: Diagnosis not present

## 2021-04-30 DIAGNOSIS — R6520 Severe sepsis without septic shock: Secondary | ICD-10-CM | POA: Diagnosis not present

## 2021-04-30 DIAGNOSIS — N1832 Chronic kidney disease, stage 3b: Secondary | ICD-10-CM | POA: Diagnosis not present

## 2021-04-30 LAB — BASIC METABOLIC PANEL
Anion gap: 11 (ref 5–15)
BUN: 57 mg/dL — ABNORMAL HIGH (ref 8–23)
CO2: 20 mmol/L — ABNORMAL LOW (ref 22–32)
Calcium: 8.3 mg/dL — ABNORMAL LOW (ref 8.9–10.3)
Chloride: 106 mmol/L (ref 98–111)
Creatinine, Ser: 3.02 mg/dL — ABNORMAL HIGH (ref 0.44–1.00)
GFR, Estimated: 15 mL/min — ABNORMAL LOW (ref >60)
Glucose, Bld: 128 mg/dL — ABNORMAL HIGH (ref 70–99)
Potassium: 3.6 mmol/L (ref 3.5–5.1)
Sodium: 137 mmol/L (ref 135–145)

## 2021-04-30 LAB — CBC
HCT: 25.8 % — ABNORMAL LOW (ref 36.0–46.0)
Hemoglobin: 8.3 g/dL — ABNORMAL LOW (ref 12.0–15.0)
MCH: 28.8 pg (ref 26.0–34.0)
MCHC: 32.2 g/dL (ref 30.0–36.0)
MCV: 89.6 fL (ref 80.0–100.0)
Platelets: 108 10*3/uL — ABNORMAL LOW (ref 150–400)
RBC: 2.88 MIL/uL — ABNORMAL LOW (ref 3.87–5.11)
RDW: 15.5 % (ref 11.5–15.5)
WBC: 19.3 10*3/uL — ABNORMAL HIGH (ref 4.0–10.5)
nRBC: 0.4 % — ABNORMAL HIGH (ref 0.0–0.2)

## 2021-04-30 LAB — GLUCOSE, CAPILLARY
Glucose-Capillary: 161 mg/dL — ABNORMAL HIGH (ref 70–99)
Glucose-Capillary: 218 mg/dL — ABNORMAL HIGH (ref 70–99)
Glucose-Capillary: 249 mg/dL — ABNORMAL HIGH (ref 70–99)
Glucose-Capillary: 116 mg/dL — ABNORMAL HIGH (ref 70–99)

## 2021-04-30 MED ORDER — SODIUM CHLORIDE 0.9 % IV SOLN
3.0000 g | Freq: Two times a day (BID) | INTRAVENOUS | Status: DC
  Administered 2021-04-30 – 2021-05-02 (×5): 3 g via INTRAVENOUS
  Filled 2021-04-30 (×5): qty 8

## 2021-04-30 NOTE — Progress Notes (Signed)
Pharmacy Antibiotic Note  [name] is a 80 y.o. female admitted on 04/24/2021 with IAI.   Pharmacy has been consulted for Unasyn dosing.  Unasyn 3 gr IV q12h  Monitor clinical course, renal function, cultures as available   Height: 5\' 4"  (162.6 cm) Weight: 82.4 kg (181 lb 10.5 oz) IBW/kg (Calculated) : 54.7  Temp (24hrs), Avg:98.4 F (36.9 C), Min:98.2 F (36.8 C), Max:98.6 F (37 C)  Recent Labs  Lab 04/25/21 0547 04/25/21 1112 04/25/21 1357 04/25/21 1835 04/25/21 2058 04/26/21 0303 04/27/21 0745 04/28/21 0257 04/29/21 0525 04/30/21 0509 04/30/21 0514  WBC  --   --   --   --   --  17.1* 17.9* 14.1* 18.3* 19.3*  --   CREATININE  --   --   --  3.53* 3.50* 3.69* 4.02* 3.51* 3.28*  --  3.02*  LATICACIDVEN 4.3* 3.0* 4.0* 3.4* 3.3*  --   --   --   --   --   --     Estimated Creatinine Clearance: 15.7 mL/min (A) (by C-G formula based on SCr of 3.02 mg/dL (H)).    Allergies  Allergen Reactions   Peanut-Containing Drug Products     Stomach pain   Amlodipine Swelling    12/23 CTX >> 12/25 12/26 Unasyn >> 12/28, 12/29 >>  12/28 cephalexin >> 12/29    12/23 BCx: 12/24 BCID 4/4 E coli, pan-sensitive 12/23 UCx: > 100K EColi 12/23 Salmonella: neg 12/25 CDiff: neg/neg  Thank you for allowing pharmacy to be a part of this patients care.  Royetta Asal, PharmD, BCPS 04/30/2021 2:30 PM

## 2021-04-30 NOTE — Progress Notes (Addendum)
PROGRESS NOTE    [name]   CNO:[id number]  DOB: 07-02-1940  DOA: 04/24/2021 PCP: Trey Sailors, PA   Brief Narrative:  [name] with diabetes mellitus type 2, CKD stage IIIb, hypertension, persistent A. fib presented to the hospital for confusion and incontinence of stool.  In the ED the patient was noted to have a temperature of 103 degrees, lactic acid of 3.1 and a UA that was consistent with a UTI.  WBC count was 9, BUN 33 and creatinine 2.4.  She was given IV fluids and started on ceftriaxone.  She later was found to have E. coli UTI and bacteremia.  Her incontinence and diarrhea resolved spontaneously.  Her renal function progressively got worse and was treated with IV fluid boluses on continuous fluids.  A nephrology consult was requested.   Subjective: Her back is itchy. No other complaints.    Assessment & Plan:   Principal Problem:   Sepsis with acute organ dysfunction without septic shock ([place]) Secondary to E. coli UTI and bacteremia - Sepsis has resolved - Per sensitivities, we can switch Unasyn to Keflex which was done on 12/28-  - oddly, WBC count is worse today and so are platelets-   - renal ultrasound showed gallbladder wall thickening- she has some mild tenderness but no vomiting or overt pain...- will resume Unasyn and get a HIDA scan  Active Problems:   Acute kidney injury superimposed on chronic kidney disease is 3B - Prerenal -   - We will need to continue IV fluids today while ensuring that she has adequate oral intake before we decide to DC her home - She takes Lasix at home which is on hold -Echocardiogram from 09/06/2020 revealed an EF of 55 to 29%-UT diastolic function could not be evaluated-RV systolic function was moderately reduced with severely elevated pulmonary artery pressures    Hypotension with h/o Essential hypertension -Holding amlodipine, furosemide, Benicar, Cardizem, isosorbide dinitrate, hydralazine due to sepsis-BP currently  not elevated enough to resume these meds  Paroxysmal atrial fibrillation ([place]) -Mali Vasc 5-cardioverted in [place] longer on anticoagulation per cardiology (Dr. Einar Gip) -Currently rate controlled in normal sinus rhythm    DM (diabetes mellitus), type 2 with renal complications (HCC) -Holding Amaryl and Humulin 70/30 - Continue sliding scale insulin and Semglee    Malnutrition of moderate degree Body mass index is 31.18 kg/m.   Time spent in minutes: 35 DVT prophylaxis: SCDs Start: 04/25/21 0152  Code Status: Full code Family Communication:  Level of Care: Level of care: Telemetry Disposition Plan:  Status is: Inpatient  Remains inpatient appropriate because: Continuing IV fluids due to poor oral intake      Consultants:  Nephrology Pulmonary critical care Procedures:  None Antimicrobials:  Anti-infectives (From admission, onward)    Start     Dose/Rate Route Frequency Ordered Stop   04/29/21 1215  cephALEXin (KEFLEX) capsule 500 mg        500 mg Oral Every 12 hours 04/29/21 1119     04/27/21 1345  Ampicillin-Sulbactam (UNASYN) 3 g in sodium chloride 0.9 % 100 mL IVPB  Status:  Discontinued        3 g 200 mL/hr over 30 Minutes Intravenous Every 12 hours 04/27/21 1254 04/29/21 1119   04/25/21 1800  cefTRIAXone (ROCEPHIN) 2 g in sodium chloride 0.9 % 100 mL IVPB  Status:  Discontinued        2 g 200 mL/hr over 30 Minutes Intravenous Every 24 hours 04/25/21 1012 04/27/21 1254  04/24/21 2030  cefTRIAXone (ROCEPHIN) 2 g in sodium chloride 0.9 % 100 mL IVPB  Status:  Discontinued        2 g 200 mL/hr over 30 Minutes Intravenous Every 24 hours 04/24/21 2017 04/25/21 1012        Objective: Vitals:   04/29/21 1354 04/29/21 2123 04/30/21 0500 04/30/21 0520  BP: 131/67 (!) 114/56  118/64  Pulse: 81 (!) 58  79  Resp: 20 20  16   Temp: 98 F (36.7 C) 98.3 F (36.8 C)  98.6 F (37 C)  TempSrc: Oral Oral  Oral  SpO2: 100% 100%  99%  Weight:   82.4 kg    Height:        Intake/Output Summary (Last 24 hours) at 04/30/2021 1221 Last data filed at 04/30/2021 0900 Gross per 24 hour  Intake 1250.76 ml  Output 800 ml  Net 450.76 ml    Filed Weights   04/28/21 0500 04/29/21 0454 04/30/21 0500  Weight: 78.1 kg 83.4 kg 82.4 kg    Examination: General exam: Appears comfortable  HEENT: PERRLA, oral mucosa moist, no sclera icterus or thrush Respiratory system: Clear to auscultation. Respiratory effort normal. Cardiovascular system: S1 & S2 heard, RRR.   Gastrointestinal system: Abdomen soft, mildly tender in RUQ, nondistended. Normal bowel sounds. Central nervous system: Alert and oriented. No focal neurological deficits. Extremities: No cyanosis, clubbing or edema Skin: No rashes or ulcers Psychiatry:  Mood & affect appropriate.     Data Reviewed: I have personally reviewed following labs and imaging studies  CBC: Recent Labs  Lab 04/25/21 0413 04/26/21 0303 04/27/21 0745 04/28/21 0257 04/29/21 0525 04/30/21 0509  WBC 19.2* 17.1* 17.9* 14.1* 18.3* 19.3*  NEUTROABS 14.1* 12.0* 15.3* 11.3* 13.0*  --   HGB 9.9* 9.5* 8.5* 8.6* 8.7* 8.3*  HCT 30.9* 29.1* 26.1* 26.6* 26.3* 25.8*  MCV 93.4 89.5 90.3 89.0 88.9 89.6  PLT 67* 66* 75* 68* 83* 108*    Basic Metabolic Panel: Recent Labs  Lab 04/26/21 0303 04/27/21 0745 04/28/21 0257 04/29/21 0525 04/30/21 0514  NA 139 138 135 137 137  K 3.7 3.5 3.7 3.6 3.6  CL 107 108 105 109 106  CO2 22 21* 19* 20* 20*  GLUCOSE 132* 122* 380* 126* 128*  BUN 51* 61* 67* 63* 57*  CREATININE 3.69* 4.02* 3.51* 3.28* 3.02*  CALCIUM 7.8* 7.8* 7.8* 8.3* 8.3*  MG 1.8  --  1.9 2.1  --   PHOS 3.8  --  2.8 2.7  --     GFR: Estimated Creatinine Clearance: 15.7 mL/min (A) (by C-G formula based on SCr of 3.02 mg/dL (H)). Liver Function Tests: Recent Labs  Lab 04/25/21 0413 04/26/21 0303 04/27/21 0745 04/28/21 0257 04/29/21 0525  AST 63* 106* 78* 55* 37  ALT 31 49* 54* 48* 38  ALKPHOS 62 63  65 69 71  BILITOT 1.0 1.0 0.8 0.6 0.8  PROT 6.5 6.5 6.3* 5.7* 6.0*  ALBUMIN 3.0* 3.0* 2.7* 2.5* 2.6*    No results for input(s): LIPASE, AMYLASE in the last 168 hours. No results for input(s): AMMONIA in the last 168 hours. Coagulation Profile: Recent Labs  Lab 04/24/21 1821  INR 1.3*    Cardiac Enzymes: No results for input(s): CKTOTAL, CKMB, CKMBINDEX, TROPONINI in the last 168 hours. BNP (last 3 results) No results for input(s): PROBNP in the last 8760 hours. HbA1C: No results for input(s): HGBA1C in the last 72 hours. CBG: Recent Labs  Lab 04/29/21 1627 04/29/21 2122 04/29/21 2200  04/29/21 2356 04/30/21 0800  GLUCAP 146* 65* 109* 110* 161*    Lipid Profile: No results for input(s): CHOL, HDL, LDLCALC, TRIG, CHOLHDL, LDLDIRECT in the last 72 hours. Thyroid Function Tests: No results for input(s): TSH, T4TOTAL, FREET4, T3FREE, THYROIDAB in the last 72 hours. Anemia Panel: No results for input(s): VITAMINB12, FOLATE, FERRITIN, TIBC, IRON, RETICCTPCT in the last 72 hours. Urine analysis:    Component Value Date/Time   COLORURINE YELLOW 04/24/2021 1835   APPEARANCEUR CLOUDY (A) 04/24/2021 1835   LABSPEC 1.020 04/24/2021 1835   PHURINE 6.0 04/24/2021 1835   GLUCOSEU NEGATIVE 04/24/2021 1835   HGBUR LARGE (A) 04/24/2021 1835   BILIRUBINUR NEGATIVE 04/24/2021 1835   KETONESUR NEGATIVE 04/24/2021 1835   PROTEINUR >300 (A) 04/24/2021 1835   NITRITE POSITIVE (A) 04/24/2021 1835   LEUKOCYTESUR MODERATE (A) 04/24/2021 1835   Sepsis Labs: @LABRCNTIP (procalcitonin:4,lacticidven:4) ) Recent Results (from the past 240 hour(s))  Blood Culture (routine x 2)     Status: Abnormal   Collection Time: 04/24/21  6:21 PM   Specimen: BLOOD  Result Value Ref Range Status   Specimen Description   Final    BLOOD LEFT ANTECUBITAL Performed at [facility], [place] [address], [place], [place] [address]    Special Requests   Final    BOTTLES DRAWN AEROBIC AND  ANAEROBIC Blood Culture results may not be optimal due to an excessive volume of blood received in culture bottles Performed at [facility] [address], [place], [place] [address]    Culture  Setup Time   Final    GRAM NEGATIVE RODS IN BOTH AEROBIC AND ANAEROBIC BOTTLES CRITICAL RESULT CALLED TO, READ BACK BY AND VERIFIED WITH: D,WOFFORD PHARMD @1005  04/25/21 EB Performed at [facility] [address] [place],  [address]    Culture ESCHERICHIA COLI (A)  Final   Report Status 04/27/2021 FINAL  Final   Organism ID, Bacteria ESCHERICHIA COLI  Final      Susceptibility   Escherichia coli - MIC*    AMPICILLIN <=2 SENSITIVE Sensitive     CEFAZOLIN <=4 SENSITIVE Sensitive     CEFEPIME <=0.12 SENSITIVE Sensitive     CEFTAZIDIME <=1 SENSITIVE Sensitive     CEFTRIAXONE <=0.25 SENSITIVE Sensitive     CIPROFLOXACIN <=0.25 SENSITIVE Sensitive     GENTAMICIN <=1 SENSITIVE Sensitive     IMIPENEM <=0.25 SENSITIVE Sensitive     TRIMETH/SULFA <=20 SENSITIVE Sensitive     AMPICILLIN/SULBACTAM <=2 SENSITIVE Sensitive     PIP/TAZO <=4 SENSITIVE Sensitive     * ESCHERICHIA COLI  Blood Culture ID Panel (Reflexed)     Status: Abnormal   Collection Time: 04/24/21  6:21 PM  Result Value Ref Range Status   Enterococcus faecalis NOT DETECTED NOT DETECTED Final   Enterococcus Faecium NOT DETECTED NOT DETECTED Final   Listeria monocytogenes NOT DETECTED NOT DETECTED Final   Staphylococcus species NOT DETECTED NOT DETECTED Final   Staphylococcus aureus (BCID) NOT DETECTED NOT DETECTED Final   Staphylococcus epidermidis NOT DETECTED NOT DETECTED Final   Staphylococcus lugdunensis NOT DETECTED NOT DETECTED Final   Streptococcus species NOT DETECTED NOT DETECTED Final   Streptococcus agalactiae NOT DETECTED NOT DETECTED Final   Streptococcus pneumoniae NOT DETECTED NOT DETECTED Final   Streptococcus pyogenes NOT DETECTED NOT DETECTED Final   A.calcoaceticus-baumannii  NOT DETECTED NOT DETECTED Final   Bacteroides fragilis NOT DETECTED NOT DETECTED Final   Enterobacterales DETECTED (A) NOT DETECTED Final    Comment: Enterobacterales  represent a large order of gram negative bacteria, not a single organism. CRITICAL RESULT CALLED TO, READ BACK BY AND VERIFIED WITH: D,WOFFORD PHARMD @1005  04/25/21 EB    Enterobacter cloacae complex NOT DETECTED NOT DETECTED Final   Escherichia coli DETECTED (A) NOT DETECTED Final    Comment: CRITICAL RESULT CALLED TO, READ BACK BY AND VERIFIED WITH: D,WOFFORD PHARMD @1005  04/25/21 EB    Klebsiella aerogenes NOT DETECTED NOT DETECTED Final   Klebsiella oxytoca NOT DETECTED NOT DETECTED Final   Klebsiella pneumoniae NOT DETECTED NOT DETECTED Final   Proteus species NOT DETECTED NOT DETECTED Final   Salmonella species NOT DETECTED NOT DETECTED Final   Serratia marcescens NOT DETECTED NOT DETECTED Final   Haemophilus influenzae NOT DETECTED NOT DETECTED Final   Neisseria meningitidis NOT DETECTED NOT DETECTED Final   Pseudomonas aeruginosa NOT DETECTED NOT DETECTED Final   Stenotrophomonas maltophilia NOT DETECTED NOT DETECTED Final   Candida albicans NOT DETECTED NOT DETECTED Final   Candida auris NOT DETECTED NOT DETECTED Final   Candida glabrata NOT DETECTED NOT DETECTED Final   Candida krusei NOT DETECTED NOT DETECTED Final   Candida parapsilosis NOT DETECTED NOT DETECTED Final   Candida tropicalis NOT DETECTED NOT DETECTED Final   Cryptococcus neoformans/gattii NOT DETECTED NOT DETECTED Final   CTX-M ESBL NOT DETECTED NOT DETECTED Final   Carbapenem resistance IMP NOT DETECTED NOT DETECTED Final   Carbapenem resistance KPC NOT DETECTED NOT DETECTED Final   Carbapenem resistance NDM NOT DETECTED NOT DETECTED Final   Carbapenem resist OXA 48 LIKE NOT DETECTED NOT DETECTED Final   Carbapenem resistance VIM NOT DETECTED NOT DETECTED Final    Comment: Performed at [facility], [address] [place], [place]  [address]  Resp Panel by RT-PCR (Flu A&B, Covid) Nasopharyngeal Swab     Status: None   Collection Time: 04/24/21  6:24 PM   Specimen: Nasopharyngeal Swab; Nasopharyngeal(NP) swabs in vial transport medium  Result Value Ref Range Status   SARS Coronavirus 2 by RT PCR NEGATIVE NEGATIVE Final    Comment: (NOTE) SARS-CoV-2 target nucleic acids are NOT DETECTED.  The SARS-CoV-2 RNA is generally detectable in upper respiratory specimens during the acute phase of infection. The lowest concentration of SARS-CoV-2 viral copies this assay can detect is 138 copies/mL. A negative result does not preclude SARS-Cov-2 infection and should not be used as the sole basis for treatment or other patient management decisions. A negative result may occur with  improper specimen collection/handling, submission of specimen other than nasopharyngeal swab, presence of viral mutation(s) within the areas targeted by this assay, and inadequate number of viral copies(<138 copies/mL). A negative result must be combined with clinical observations, patient history, and epidemiological information. The expected result is Negative.  Fact Sheet for Patients:  EntrepreneurPulse.com.au  Fact Sheet for Healthcare Providers:  IncredibleEmployment.be  This test is no t yet approved or cleared by the [place] FDA and  has been authorized for detection and/or diagnosis of SARS-CoV-2 by FDA under an Emergency Use Authorization (EUA). This EUA will remain  in effect (meaning this test can be used) for the duration of the COVID-19 declaration under Section 564(b)(1) of the Act, 21 U.S.C.section 360bbb-3(b)(1), unless the authorization is terminated  or revoked sooner.       Influenza A by PCR NEGATIVE NEGATIVE Final   Influenza B by PCR NEGATIVE NEGATIVE Final    Comment: (NOTE) The Xpert Xpress SARS-CoV-2/FLU/RSV plus assay is intended as an aid in the diagnosis  of influenza from  Nasopharyngeal swab specimens and should not be used as a sole basis for treatment. Nasal washings and aspirates are unacceptable for Xpert Xpress SARS-CoV-2/FLU/RSV testing.  Fact Sheet for Patients: EntrepreneurPulse.com.au  Fact Sheet for Healthcare Providers: IncredibleEmployment.be  This test is not yet approved or cleared by the [place] FDA and has been authorized for detection and/or diagnosis of SARS-CoV-2 by FDA under an Emergency Use Authorization (EUA). This EUA will remain in effect (meaning this test can be used) for the duration of the COVID-19 declaration under Section 564(b)(1) of the Act, 21 U.S.C. section 360bbb-3(b)(1), unless the authorization is terminated or revoked.  Performed at [facility], [place] [address], [place], [place] [address]   Urine Culture     Status: Abnormal   Collection Time: 04/24/21  6:35 PM   Specimen: In/Out Cath Urine  Result Value Ref Range Status   Specimen Description   Final    IN/OUT CATH URINE Performed at [facility] [address], [place], [place] [address]    Special Requests   Final    NONE Performed at [facility], [place] [address] [place], [place] [address]    Culture >=100,000 COLONIES/mL ESCHERICHIA COLI (A)  Final   Report Status 04/27/2021 FINAL  Final   Organism ID, Bacteria ESCHERICHIA COLI (A)  Final      Susceptibility   Escherichia coli - MIC*    AMPICILLIN <=2 SENSITIVE Sensitive     CEFAZOLIN <=4 SENSITIVE Sensitive     CEFEPIME <=0.12 SENSITIVE Sensitive     CEFTRIAXONE <=0.25 SENSITIVE Sensitive     CIPROFLOXACIN <=0.25 SENSITIVE Sensitive     GENTAMICIN <=1 SENSITIVE Sensitive     IMIPENEM <=0.25 SENSITIVE Sensitive     NITROFURANTOIN <=16 SENSITIVE Sensitive     TRIMETH/SULFA <=20 SENSITIVE Sensitive     AMPICILLIN/SULBACTAM <=2 SENSITIVE Sensitive     PIP/TAZO <=4 SENSITIVE Sensitive     *  >=100,000 COLONIES/mL ESCHERICHIA COLI  Blood Culture (routine x 2)     Status: Abnormal   Collection Time: 04/24/21  8:31 PM   Specimen: BLOOD  Result Value Ref Range Status   Specimen Description   Final    BLOOD Performed at [facility] [address] [place], [place] [address]    Special Requests   Final    BOTTLES DRAWN AEROBIC AND ANAEROBIC Blood Culture adequate volume Performed at [facility] [address] [place], [place] [address]    Culture  Setup Time   Final    GRAM NEGATIVE RODS IN BOTH AEROBIC AND ANAEROBIC BOTTLES CRITICAL VALUE NOTED.  VALUE IS CONSISTENT WITH PREVIOUSLY REPORTED AND CALLED VALUE.    Culture (A)  Final    ESCHERICHIA COLI SUSCEPTIBILITIES PERFORMED ON PREVIOUS CULTURE WITHIN THE LAST 5 DAYS. Performed at [facility], [place] [address], [place], [place] [address]    Report Status 04/27/2021 FINAL  Final  MRSA Next Gen by PCR, Nasal     Status: None   Collection Time: 04/25/21  4:50 PM   Specimen: Nasal Mucosa; Nasal Swab  Result Value Ref Range Status   MRSA by PCR Next Gen NOT DETECTED NOT DETECTED Final    Comment: (NOTE) The GeneXpert MRSA Assay (FDA approved for NASAL specimens only), is one component of a comprehensive MRSA colonization surveillance program. It is not intended to diagnose MRSA infection nor to guide or monitor treatment for MRSA infections. Test performance is  not FDA approved in patients less than 89 years old. Performed at [facility], [place] [address] [place], [place] [address]   C Difficile Quick Screen w PCR reflex     Status: None   Collection Time: 04/26/21  4:58 AM   Specimen: STOOL  Result Value Ref Range Status   C Diff antigen NEGATIVE NEGATIVE Final   C Diff toxin NEGATIVE NEGATIVE Final   C Diff interpretation No C. difficile detected.  Final    Comment: Performed at [facility], [place] [address], [place], [place]  [address]         Radiology Studies: US Abdomen Limited RUQ (LIVER/GB)  Result Date: 04/28/2021 CLINICAL DATA:  Abnormal LFTs EXAM: ULTRASOUND ABDOMEN LIMITED RIGHT UPPER QUADRANT COMPARISON:  CT 04/25/2021 FINDINGS: Gallbladder: Gallbladder wall thickening up to 8.9 mm. Moderate pericholecystic fluid. Sonographer describes no sonographic Murphy's sign however. Layering echogenic material in the fundus possibly small stones or sludge. Common bile duct: Diameter: 9 mm Liver: No focal lesion identified. Within normal limits in parenchymal echogenicity. Portal vein is patent on color Doppler imaging with normal direction of blood flow towards the liver. Other: Small volume abdominal ascites. Right pleural effusion noted. IMPRESSION: 1. Nonspecific but marked gallbladder wall thickening, with layering small stones or sludge. 2. Dilated common bile duct. 3. Small volume abdominal ascites including pericholecystic fluid, and right pleural effusion incidentally noted. Electronically Signed   By: Lucrezia Europe M.D.   On: 04/28/2021 17:19      Scheduled Meds:  cephALEXin  500 mg Oral Q12H   Chlorhexidine Gluconate Cloth  6 each Topical Q0600   dicyclomine  10 mg Oral TID AC & HS   feeding supplement  237 mL Oral TID BM   insulin aspart  0-15 Units Subcutaneous TID WC   insulin aspart  0-5 Units Subcutaneous QHS   insulin aspart  3 Units Subcutaneous TID WC   insulin glargine-yfgn  8 Units Subcutaneous QHS   mouth rinse  15 mL Mouth Rinse BID   multivitamin with minerals  1 tablet Oral Daily   pantoprazole (PROTONIX) IV  40 mg Intravenous QHS   Continuous Infusions:  sodium chloride 75 mL/hr at 04/30/21 1048     LOS: 6 days      Debbe Odea, MD [facility] Pager: [email or web address] 04/30/2021, 12:21 PM

## 2021-04-30 NOTE — Progress Notes (Signed)
Occupational Therapy Treatment Patient Details Name: [name] MRN: [id number] DOB: 04-Oct-1940 Today's Date: 04/30/2021   History of present illness Patient is a 80 y.o. female with PMH: type 2 diabetes, hypertension and CKD stage III. She was brought to ED on 12/23 when daughter noted patient to be having change in mental status with patient being confused, had stool incontinence. In ED, UTI was revealed as per results from UA, patient having high fever and acute on chronic renal insufficiency. Blood cultures were positive for E.coli.   OT comments  Patient progressing and showed improved ability to stand x 2 reps from recliner with Moderate assist of 1, and stood once from [facility] with Weiner of 1 person compared to previous session when pt required assistance of 2 people for all transfers. Pt does pivot with unsure, shuffling steps and verbalized anxiety in standing and fear of falling. Pt responded well to reassurance and safety protocols.  Pt may also lack some safety awareness as pt had slid far down in recliner and almost sitting on leg rests but had no pushed call bell for help. Pt was educated on imporitance of doing this to stay safe and pt agreed.  Patient remains limited by generalized weakness and decreased activity tolerance along with deficits noted below. Pt continues to demonstrate good rehab potential and would benefit from continued skilled OT to increase safety and independence with ADLs and functional transfers to allow pt to return home safely and reduce caregiver burden and fall risk.    Recommendations for follow up therapy are one component of a multi-disciplinary discharge planning process, led by the attending physician.  Recommendations may be updated based on patient status, additional functional criteria and insurance authorization.    Follow Up Recommendations  Home health OT    Assistance Recommended at Discharge Frequent or constant Supervision/Assistance   Equipment Recommendations  None recommended by OT    Recommendations for Other Services      Precautions / Restrictions Precautions Precautions: Fall Restrictions Weight Bearing Restrictions: No       Mobility Bed Mobility               General bed mobility comments: In recliner    Transfers Overall transfer level: Needs assistance Equipment used: Rolling walker (2 wheels) Transfers: Sit to/from Stand;Bed to chair/wheelchair/BSC Sit to Stand: Mod assist Stand pivot transfers: Mod assist               Balance Overall balance assessment: Needs assistance Sitting-balance support: Feet supported;Bilateral upper extremity supported Sitting balance-Leahy Scale: Fair     Standing balance support: During functional activity;Bilateral upper extremity supported;Reliant on assistive device for balance Standing balance-Leahy Scale: Poor                             ADL either performed or assessed with clinical judgement   ADL       Grooming: Set up;Sitting Grooming Details (indicate cue type and reason): in recliner     Lower Body Bathing: Sit to/from stand;Sitting/lateral leans;Maximal assistance Lower Body Bathing Details (indicate cue type and reason): Max As to sponge off lower legs and feet after bladder incontinence with standing. Pt was assisted at recliner level with LB bathing.     Lower Body Dressing: Sitting/lateral leans;Moderate assistance Lower Body Dressing Details (indicate cue type and reason): Mod assist to doff wet socks while in recliner. Toilet Transfer: Moderate assistance;BSC/3in1;Stand-pivot Toilet Transfer Details (indicate cue type  and reason): Pt stood from recliner x 2 reps. 1st stand with Mod As of 1 person to RW, pt worked on weight shifting to RT and LT, then released baldder. Pt was assisted back to recliner for hygiene. Pt then stood from recliner with Mod As and used RW to pivot to [facility] very slowly with Mod As.  Descending to [facility] with cues for UE reach back and Mod As for control. Pt stood from [facility] with Min As and pivoted with Min As and RW back to RW taking more steps and less shuffles as she did with pivot to [facility]. Pt lowered to recliner with Mod As and cues for UE reach back. Toileting- Clothing Manipulation and Hygiene: Maximal assistance;Sit to/from stand;Sitting/lateral lean Toileting - Clothing Manipulation Details (indicate cue type and reason): See above in LE bathing.     Functional mobility during ADLs: Moderate assistance;Minimal assistance;Rolling walker (2 wheels)      Extremity/Trunk Assessment              Vision   Vision Assessment?: No apparent visual deficits   Perception     Praxis      Cognition Arousal/Alertness: Awake/alert Behavior During Therapy: WFL for tasks assessed/performed;Anxious (Anxious when standing) Overall Cognitive Status: Within Functional Limits for tasks assessed                                            Exercises     Shoulder Instructions       General Comments      Pertinent Vitals/ Pain       Pain Assessment: Faces Faces Pain Scale: Hurts a little bit Pain Location: LT lower flank. Pt holding with hand and states that pqain will come unpredictably. Pt denied increase in pain when voiding bladder. Pain Descriptors / Indicators: Guarding Pain Intervention(s): Limited activity within patient's tolerance;Monitored during session;Repositioned  Home Living                                          Prior Functioning/Environment              Frequency  Min 2X/week        Progress Toward Goals  OT Goals(current goals can now be found in the care plan section)  Progress towards OT goals: Progressing toward goals  Acute Rehab OT Goals OT Goal Formulation: With patient Time For Goal Achievement: 05/12/21 Potential to Achieve Goals: [place] Discharge plan remains appropriate     Co-evaluation                 AM-PAC OT "6 Clicks" Daily Activity     Outcome Measure   Help from another person eating meals?: None Help from another person taking care of personal grooming?: A Little Help from another person toileting, which includes using toliet, bedpan, or urinal?: A Lot Help from another person bathing (including washing, rinsing, drying)?: A Lot Help from another person to put on and taking off regular upper body clothing?: A Lot Help from another person to put on and taking off regular lower body clothing?: A Lot 6 Click Score: 15    End of Session Equipment Utilized During Treatment: Gait belt;Rolling walker (2 wheels)  OT Visit Diagnosis: Unsteadiness on feet (R26.81);Muscle weakness (generalized) (M62.81)  Activity Tolerance Patient tolerated treatment well   Patient Left in chair;with call bell/phone within reach;with chair alarm set   Nurse Communication Mobility status        Time: 9507-2257 OT Time Calculation (min): 31 min  Charges: OT General Charges $OT Visit: 1 Visit OT Treatments $Self Care/Home Management : 8-22 mins $Therapeutic Activity: 8-22 mins  Anderson [place], [place] Office: [phone] 04/30/2021  Julien Girt 04/30/2021, 2:38 PM

## 2021-05-01 ENCOUNTER — Inpatient Hospital Stay (HOSPITAL_COMMUNITY): Payer: Medicare HMO

## 2021-05-01 LAB — BASIC METABOLIC PANEL
Anion gap: 8 (ref 5–15)
BUN: 55 mg/dL — ABNORMAL HIGH (ref 8–23)
CO2: 21 mmol/L — ABNORMAL LOW (ref 22–32)
Calcium: 7.9 mg/dL — ABNORMAL LOW (ref 8.9–10.3)
Chloride: 106 mmol/L (ref 98–111)
Creatinine, Ser: 2.78 mg/dL — ABNORMAL HIGH (ref 0.44–1.00)
GFR, Estimated: 17 mL/min — ABNORMAL LOW (ref >60)
Glucose, Bld: 139 mg/dL — ABNORMAL HIGH (ref 70–99)
Potassium: 3.7 mmol/L (ref 3.5–5.1)
Sodium: 135 mmol/L (ref 135–145)

## 2021-05-01 LAB — CBC
HCT: 25.2 % — ABNORMAL LOW (ref 36.0–46.0)
Hemoglobin: 8.2 g/dL — ABNORMAL LOW (ref 12.0–15.0)
MCH: 29.5 pg (ref 26.0–34.0)
MCHC: 32.5 g/dL (ref 30.0–36.0)
MCV: 90.6 fL (ref 80.0–100.0)
Platelets: 152 10*3/uL (ref 150–400)
RBC: 2.78 MIL/uL — ABNORMAL LOW (ref 3.87–5.11)
RDW: 15.7 % — ABNORMAL HIGH (ref 11.5–15.5)
WBC: 17 10*3/uL — ABNORMAL HIGH (ref 4.0–10.5)
nRBC: 0.2 % (ref 0.0–0.2)

## 2021-05-01 LAB — GLUCOSE, CAPILLARY
Glucose-Capillary: 166 mg/dL — ABNORMAL HIGH (ref 70–99)
Glucose-Capillary: 303 mg/dL — ABNORMAL HIGH (ref 70–99)
Glucose-Capillary: 190 mg/dL — ABNORMAL HIGH (ref 70–99)

## 2021-05-01 MED ORDER — AMLODIPINE BESYLATE 10 MG PO TABS
10.0000 mg | ORAL_TABLET | Freq: Every day | ORAL | Status: DC
  Administered 2021-05-01 – 2021-05-02 (×2): 10 mg via ORAL
  Filled 2021-05-01 (×2): qty 1

## 2021-05-01 MED ORDER — TECHNETIUM TC 99M MEBROFENIN IV KIT
5.4000 | PACK | Freq: Once | INTRAVENOUS | Status: AC
  Administered 2021-05-01: 08:00:00 5.4 via INTRAVENOUS

## 2021-05-01 NOTE — Plan of Care (Signed)

## 2021-05-01 NOTE — Progress Notes (Signed)
PROGRESS NOTE    [name]   YKD:[id number]  DOB: 16-Oct-1940  DOA: 04/24/2021 PCP: Trey Sailors, PA   Brief Narrative:  [name] with diabetes mellitus type 2, CKD stage IIIb, hypertension, persistent A. fib presented to the hospital for confusion and incontinence of stool.  In the ED the patient was noted to have a temperature of 103 degrees, lactic acid of 3.1 and a UA that was consistent with a UTI.  WBC count was 9, BUN 33 and creatinine 2.4.  She was given IV fluids and started on ceftriaxone.  She later was found to have E. coli UTI and bacteremia.  Her incontinence and diarrhea resolved spontaneously.  Her renal function progressively got worse and was treated with IV fluid boluses on continuous fluids.  A nephrology consult was requested.   Subjective: Feels tired and hungry.    Assessment & Plan:   Principal Problem:   Sepsis with acute organ dysfunction without septic shock ([place]) Secondary to E. coli UTI and bacteremia - Sepsis has resolved - Per sensitivities, we can switch Unasyn to Keflex which was done on 12/28-  - 12/29> oddly, WBC count is worse today and so are platelets-   - renal ultrasound showed gallbladder wall thickening- she has some mild tenderness but no vomiting or overt pain...- will resume Unasyn and get a HIDA scan - 12/30> HIDA neg for cholecystitis- cont Unasyn for today- WBC slightly better  Active Problems:   Acute kidney injury superimposed on chronic kidney disease is 3B - Prerenal -   - We will need to continue IV fluids today while ensuring that she has adequate oral intake before we decide to DC her home - She takes Lasix at home which is on hold -Echocardiogram from 09/06/2020 revealed an EF of 55 to 39%-JQ diastolic function could not be evaluated-RV systolic function was moderately reduced with severely elevated pulmonary artery pressures    Hypotension with h/o Essential hypertension -Holding amlodipine, furosemide,  isosorbide dinitrate, hydralazine due to sepsis-BP currently not elevated enough to resume these meds - BP rising today- can resume Amlodipine  Paroxysmal atrial fibrillation ([place]) -Mali Vasc 5-cardioverted in [place] longer on anticoagulation per cardiology (Dr. Einar Gip) -Currently rate controlled in normal sinus rhythm    DM (diabetes mellitus), type 2 with renal complications (HCC) -Holding Amaryl and Humulin 70/30 - Continue sliding scale insulin and Semglee    Malnutrition of moderate degree Body mass index is 31.86 kg/m.   Time spent in minutes: 35 DVT prophylaxis: SCDs Start: 04/25/21 0152  Code Status: Full code Family Communication:  Level of Care: Level of care: Telemetry Disposition Plan:  Status is: Inpatient  Remains inpatient appropriate because: Waiting on WBC count to improve further- cont IVF for AKI      Consultants:  Nephrology Pulmonary critical care Procedures:  None Antimicrobials:  Anti-infectives (From admission, onward)    Start     Dose/Rate Route Frequency Ordered Stop   04/30/21 1400  Ampicillin-Sulbactam (UNASYN) 3 g in sodium chloride 0.9 % 100 mL IVPB        3 g 200 mL/hr over 30 Minutes Intravenous Every 12 hours 04/30/21 1304     04/29/21 1215  cephALEXin (KEFLEX) capsule 500 mg  Status:  Discontinued        500 mg Oral Every 12 hours 04/29/21 1119 04/30/21 1225   04/27/21 1345  Ampicillin-Sulbactam (UNASYN) 3 g in sodium chloride 0.9 % 100 mL IVPB  Status:  Discontinued  3 g 200 mL/hr over 30 Minutes Intravenous Every 12 hours 04/27/21 1254 04/29/21 1119   04/25/21 1800  cefTRIAXone (ROCEPHIN) 2 g in sodium chloride 0.9 % 100 mL IVPB  Status:  Discontinued        2 g 200 mL/hr over 30 Minutes Intravenous Every 24 hours 04/25/21 1012 04/27/21 1254   04/24/21 2030  cefTRIAXone (ROCEPHIN) 2 g in sodium chloride 0.9 % 100 mL IVPB  Status:  Discontinued        2 g 200 mL/hr over 30 Minutes Intravenous Every 24 hours  04/24/21 2017 04/25/21 1012        Objective: Vitals:   04/30/21 1430 04/30/21 2001 05/01/21 0500 05/01/21 1425  BP: 135/64 (!) 150/83 137/67 (!) 152/76  Pulse: 73 71 68 85  Resp:  18 18 20   Temp:  98.8 F (37.1 C) 98.9 F (37.2 C) 98.1 F (36.7 C)  TempSrc:  Oral Oral Oral  SpO2:  100% 100% 99%  Weight:   84.2 kg   Height:        Intake/Output Summary (Last 24 hours) at 05/01/2021 1605 Last data filed at 05/01/2021 1300 Gross per 24 hour  Intake 578.7 ml  Output 800 ml  Net -221.3 ml    Filed Weights   04/29/21 0454 04/30/21 0500 05/01/21 0500  Weight: 83.4 kg 82.4 kg 84.2 kg    Examination: General exam: Appears comfortable  HEENT: PERRLA, oral mucosa moist, no sclera icterus or thrush Respiratory system: Clear to auscultation. Respiratory effort normal. Cardiovascular system: S1 & S2 heard, regular rate and rhythm Gastrointestinal system: Abdomen soft, non-tender, nondistended. Normal bowel sounds   Central nervous system: Alert and oriented. No focal neurological deficits. Extremities: No cyanosis, clubbing or edema Skin: No rashes or ulcers Psychiatry:  Mood & affect appropriate.      Data Reviewed: I have personally reviewed following labs and imaging studies  CBC: Recent Labs  Lab 04/25/21 0413 04/26/21 0303 04/27/21 0745 04/28/21 0257 04/29/21 0525 04/30/21 0509 05/01/21 0544  WBC 19.2* 17.1* 17.9* 14.1* 18.3* 19.3* 17.0*  NEUTROABS 14.1* 12.0* 15.3* 11.3* 13.0*  --   --   HGB 9.9* 9.5* 8.5* 8.6* 8.7* 8.3* 8.2*  HCT 30.9* 29.1* 26.1* 26.6* 26.3* 25.8* 25.2*  MCV 93.4 89.5 90.3 89.0 88.9 89.6 90.6  PLT 67* 66* 75* 68* 83* 108* 027    Basic Metabolic Panel: Recent Labs  Lab 04/26/21 0303 04/27/21 0745 04/28/21 0257 04/29/21 0525 04/30/21 0514 05/01/21 0544  NA 139 138 135 137 137 135  K 3.7 3.5 3.7 3.6 3.6 3.7  CL 107 108 105 109 106 106  CO2 22 21* 19* 20* 20* 21*  GLUCOSE 132* 122* 380* 126* 128* 139*  BUN 51* 61* 67* 63* 57*  55*  CREATININE 3.69* 4.02* 3.51* 3.28* 3.02* 2.78*  CALCIUM 7.8* 7.8* 7.8* 8.3* 8.3* 7.9*  MG 1.8  --  1.9 2.1  --   --   PHOS 3.8  --  2.8 2.7  --   --     GFR: Estimated Creatinine Clearance: 17.2 mL/min (A) (by C-G formula based on SCr of 2.78 mg/dL (H)). Liver Function Tests: Recent Labs  Lab 04/25/21 0413 04/26/21 0303 04/27/21 0745 04/28/21 0257 04/29/21 0525  AST 63* 106* 78* 55* 37  ALT 31 49* 54* 48* 38  ALKPHOS 62 63 65 69 71  BILITOT 1.0 1.0 0.8 0.6 0.8  PROT 6.5 6.5 6.3* 5.7* 6.0*  ALBUMIN 3.0* 3.0* 2.7* 2.5* 2.6*  No results for input(s): LIPASE, AMYLASE in the last 168 hours. No results for input(s): AMMONIA in the last 168 hours. Coagulation Profile: Recent Labs  Lab 04/24/21 1821  INR 1.3*    Cardiac Enzymes: No results for input(s): CKTOTAL, CKMB, CKMBINDEX, TROPONINI in the last 168 hours. BNP (last 3 results) No results for input(s): PROBNP in the last 8760 hours. HbA1C: No results for input(s): HGBA1C in the last 72 hours. CBG: Recent Labs  Lab 04/30/21 0800 04/30/21 1220 04/30/21 1549 04/30/21 2107 05/01/21 1126  GLUCAP 161* 218* 249* 116* 166*    Lipid Profile: No results for input(s): CHOL, HDL, LDLCALC, TRIG, CHOLHDL, LDLDIRECT in the last 72 hours. Thyroid Function Tests: No results for input(s): TSH, T4TOTAL, FREET4, T3FREE, THYROIDAB in the last 72 hours. Anemia Panel: No results for input(s): VITAMINB12, FOLATE, FERRITIN, TIBC, IRON, RETICCTPCT in the last 72 hours. Urine analysis:    Component Value Date/Time   COLORURINE YELLOW 04/24/2021 1835   APPEARANCEUR CLOUDY (A) 04/24/2021 1835   LABSPEC 1.020 04/24/2021 1835   PHURINE 6.0 04/24/2021 1835   GLUCOSEU NEGATIVE 04/24/2021 1835   HGBUR LARGE (A) 04/24/2021 1835   BILIRUBINUR NEGATIVE 04/24/2021 1835   KETONESUR NEGATIVE 04/24/2021 1835   PROTEINUR >300 (A) 04/24/2021 1835   NITRITE POSITIVE (A) 04/24/2021 1835   LEUKOCYTESUR MODERATE (A) 04/24/2021 1835    Sepsis Labs: @LABRCNTIP (procalcitonin:4,lacticidven:4) ) Recent Results (from the past 240 hour(s))  Blood Culture (routine x 2)     Status: Abnormal   Collection Time: 04/24/21  6:21 PM   Specimen: BLOOD  Result Value Ref Range Status   Specimen Description   Final    BLOOD LEFT ANTECUBITAL Performed at [facility], [place] [address], [place], [place] [address]    Special Requests   Final    BOTTLES DRAWN AEROBIC AND ANAEROBIC Blood Culture results may not be optimal due to an excessive volume of blood received in culture bottles Performed at [facility] [address] [place], [place] [address]    Culture  Setup Time   Final    GRAM NEGATIVE RODS IN BOTH AEROBIC AND ANAEROBIC BOTTLES CRITICAL RESULT CALLED TO, READ BACK BY AND VERIFIED WITH: D,WOFFORD PHARMD @1005  04/25/21 EB Performed at [facility] [address], [place], [place] [address]    Culture ESCHERICHIA COLI (A)  Final   Report Status 04/27/2021 FINAL  Final   Organism ID, Bacteria ESCHERICHIA COLI  Final      Susceptibility   Escherichia coli - MIC*    AMPICILLIN <=2 SENSITIVE Sensitive     CEFAZOLIN <=4 SENSITIVE Sensitive     CEFEPIME <=0.12 SENSITIVE Sensitive     CEFTAZIDIME <=1 SENSITIVE Sensitive     CEFTRIAXONE <=0.25 SENSITIVE Sensitive     CIPROFLOXACIN <=0.25 SENSITIVE Sensitive     GENTAMICIN <=1 SENSITIVE Sensitive     IMIPENEM <=0.25 SENSITIVE Sensitive     TRIMETH/SULFA <=20 SENSITIVE Sensitive     AMPICILLIN/SULBACTAM <=2 SENSITIVE Sensitive     PIP/TAZO <=4 SENSITIVE Sensitive     * ESCHERICHIA COLI  Blood Culture ID Panel (Reflexed)     Status: Abnormal   Collection Time: 04/24/21  6:21 PM  Result Value Ref Range Status   Enterococcus faecalis NOT DETECTED NOT DETECTED Final   Enterococcus Faecium NOT DETECTED NOT DETECTED Final   Listeria monocytogenes NOT DETECTED NOT DETECTED Final   Staphylococcus species NOT DETECTED NOT DETECTED  Final   Staphylococcus aureus (BCID) NOT DETECTED NOT  DETECTED Final   Staphylococcus epidermidis NOT DETECTED NOT DETECTED Final   Staphylococcus lugdunensis NOT DETECTED NOT DETECTED Final   Streptococcus species NOT DETECTED NOT DETECTED Final   Streptococcus agalactiae NOT DETECTED NOT DETECTED Final   Streptococcus pneumoniae NOT DETECTED NOT DETECTED Final   Streptococcus pyogenes NOT DETECTED NOT DETECTED Final   A.calcoaceticus-baumannii NOT DETECTED NOT DETECTED Final   Bacteroides fragilis NOT DETECTED NOT DETECTED Final   Enterobacterales DETECTED (A) NOT DETECTED Final    Comment: Enterobacterales represent a large order of gram negative bacteria, not a single organism. CRITICAL RESULT CALLED TO, READ BACK BY AND VERIFIED WITH: D,WOFFORD PHARMD @1005  04/25/21 EB    Enterobacter cloacae complex NOT DETECTED NOT DETECTED Final   Escherichia coli DETECTED (A) NOT DETECTED Final    Comment: CRITICAL RESULT CALLED TO, READ BACK BY AND VERIFIED WITH: D,WOFFORD PHARMD @1005  04/25/21 EB    Klebsiella aerogenes NOT DETECTED NOT DETECTED Final   Klebsiella oxytoca NOT DETECTED NOT DETECTED Final   Klebsiella pneumoniae NOT DETECTED NOT DETECTED Final   Proteus species NOT DETECTED NOT DETECTED Final   Salmonella species NOT DETECTED NOT DETECTED Final   Serratia marcescens NOT DETECTED NOT DETECTED Final   Haemophilus influenzae NOT DETECTED NOT DETECTED Final   Neisseria meningitidis NOT DETECTED NOT DETECTED Final   Pseudomonas aeruginosa NOT DETECTED NOT DETECTED Final   Stenotrophomonas maltophilia NOT DETECTED NOT DETECTED Final   Candida albicans NOT DETECTED NOT DETECTED Final   Candida auris NOT DETECTED NOT DETECTED Final   Candida glabrata NOT DETECTED NOT DETECTED Final   Candida krusei NOT DETECTED NOT DETECTED Final   Candida parapsilosis NOT DETECTED NOT DETECTED Final   Candida tropicalis NOT DETECTED NOT DETECTED Final   Cryptococcus neoformans/gattii NOT  DETECTED NOT DETECTED Final   CTX-M ESBL NOT DETECTED NOT DETECTED Final   Carbapenem resistance IMP NOT DETECTED NOT DETECTED Final   Carbapenem resistance KPC NOT DETECTED NOT DETECTED Final   Carbapenem resistance NDM NOT DETECTED NOT DETECTED Final   Carbapenem resist OXA 48 LIKE NOT DETECTED NOT DETECTED Final   Carbapenem resistance VIM NOT DETECTED NOT DETECTED Final    Comment: Performed at [facility], [address], [place], [place] [address]  Resp Panel by RT-PCR (Flu A&B, Covid) Nasopharyngeal Swab     Status: None   Collection Time: 04/24/21  6:24 PM   Specimen: Nasopharyngeal Swab; Nasopharyngeal(NP) swabs in vial transport medium  Result Value Ref Range Status   SARS Coronavirus 2 by RT PCR NEGATIVE NEGATIVE Final    Comment: (NOTE) SARS-CoV-2 target nucleic acids are NOT DETECTED.  The SARS-CoV-2 RNA is generally detectable in upper respiratory specimens during the acute phase of infection. The lowest concentration of SARS-CoV-2 viral copies this assay can detect is 138 copies/mL. A negative result does not preclude SARS-Cov-2 infection and should not be used as the sole basis for treatment or other patient management decisions. A negative result may occur with  improper specimen collection/handling, submission of specimen other than nasopharyngeal swab, presence of viral mutation(s) within the areas targeted by this assay, and inadequate number of viral copies(<138 copies/mL). A negative result must be combined with clinical observations, patient history, and epidemiological information. The expected result is Negative.  Fact Sheet for Patients:  EntrepreneurPulse.com.au  Fact Sheet for Healthcare Providers:  IncredibleEmployment.be  This test is no t yet approved or cleared by the [place] FDA and  has been authorized for detection and/or diagnosis of SARS-CoV-2 by  FDA under an Emergency Use Authorization (EUA).  This EUA will remain  in effect (meaning this test can be used) for the duration of the COVID-19 declaration under Section 564(b)(1) of the Act, 21 U.S.C.section 360bbb-3(b)(1), unless the authorization is terminated  or revoked sooner.       Influenza A by PCR NEGATIVE NEGATIVE Final   Influenza B by PCR NEGATIVE NEGATIVE Final    Comment: (NOTE) The Xpert Xpress SARS-CoV-2/FLU/RSV plus assay is intended as an aid in the diagnosis of influenza from Nasopharyngeal swab specimens and should not be used as a sole basis for treatment. Nasal washings and aspirates are unacceptable for Xpert Xpress SARS-CoV-2/FLU/RSV testing.  Fact Sheet for Patients: EntrepreneurPulse.com.au  Fact Sheet for Healthcare Providers: IncredibleEmployment.be  This test is not yet approved or cleared by the [place] FDA and has been authorized for detection and/or diagnosis of SARS-CoV-2 by FDA under an Emergency Use Authorization (EUA). This EUA will remain in effect (meaning this test can be used) for the duration of the COVID-19 declaration under Section 564(b)(1) of the Act, 21 U.S.C. section 360bbb-3(b)(1), unless the authorization is terminated or revoked.  Performed at [facility], [place] [address] [place], [place] [address]   Urine Culture     Status: Abnormal   Collection Time: 04/24/21  6:35 PM   Specimen: In/Out Cath Urine  Result Value Ref Range Status   Specimen Description   Final    IN/OUT CATH URINE Performed at [facility] [address], [place], [place] [address]    Special Requests   Final    NONE Performed at [facility], [place] [address], [place], [place] [address]    Culture >=100,000 COLONIES/mL ESCHERICHIA COLI (A)  Final   Report Status 04/27/2021 FINAL  Final   Organism ID, Bacteria ESCHERICHIA COLI (A)  Final      Susceptibility   Escherichia coli - MIC*    AMPICILLIN  <=2 SENSITIVE Sensitive     CEFAZOLIN <=4 SENSITIVE Sensitive     CEFEPIME <=0.12 SENSITIVE Sensitive     CEFTRIAXONE <=0.25 SENSITIVE Sensitive     CIPROFLOXACIN <=0.25 SENSITIVE Sensitive     GENTAMICIN <=1 SENSITIVE Sensitive     IMIPENEM <=0.25 SENSITIVE Sensitive     NITROFURANTOIN <=16 SENSITIVE Sensitive     TRIMETH/SULFA <=20 SENSITIVE Sensitive     AMPICILLIN/SULBACTAM <=2 SENSITIVE Sensitive     PIP/TAZO <=4 SENSITIVE Sensitive     * >=100,000 COLONIES/mL ESCHERICHIA COLI  Blood Culture (routine x 2)     Status: Abnormal   Collection Time: 04/24/21  8:31 PM   Specimen: BLOOD  Result Value Ref Range Status   Specimen Description   Final    BLOOD Performed at [facility] [address] [place], [place] [address]    Special Requests   Final    BOTTLES DRAWN AEROBIC AND ANAEROBIC Blood Culture adequate volume Performed at [facility] [address], [place], [place] [address]    Culture  Setup Time   Final    GRAM NEGATIVE RODS IN BOTH AEROBIC AND ANAEROBIC BOTTLES CRITICAL VALUE NOTED.  VALUE IS CONSISTENT WITH PREVIOUSLY REPORTED AND CALLED VALUE.    Culture (A)  Final    ESCHERICHIA COLI SUSCEPTIBILITIES PERFORMED ON PREVIOUS CULTURE WITHIN THE LAST 5 DAYS. Performed at [facility], [place] [address] [place], [place] [address]    Report Status 04/27/2021 FINAL  Final  MRSA Next Gen by  PCR, Nasal     Status: None   Collection Time: 04/25/21  4:50 PM   Specimen: Nasal Mucosa; Nasal Swab  Result Value Ref Range Status   MRSA by PCR Next Gen NOT DETECTED NOT DETECTED Final    Comment: (NOTE) The GeneXpert MRSA Assay (FDA approved for NASAL specimens only), is one component of a comprehensive MRSA colonization surveillance program. It is not intended to diagnose MRSA infection nor to guide or monitor treatment for MRSA infections. Test performance is not FDA approved in patients less than 51 years old. Performed at  [facility], [place] [address], [place], [place] [address]   C Difficile Quick Screen w PCR reflex     Status: None   Collection Time: 04/26/21  4:58 AM   Specimen: STOOL  Result Value Ref Range Status   C Diff antigen NEGATIVE NEGATIVE Final   C Diff toxin NEGATIVE NEGATIVE Final   C Diff interpretation No C. difficile detected.  Final    Comment: Performed at [facility], [place] [address], [place], [place] [address]         Radiology Studies: NM Hepato W/EF  Result Date: 05/01/2021 CLINICAL DATA:  Right upper quadrant abdominal pain. EXAM: NUCLEAR MEDICINE HEPATOBILIARY IMAGING WITH GALLBLADDER EF TECHNIQUE: Sequential images of the abdomen were obtained out to 60 minutes following intravenous administration of radiopharmaceutical. After oral ingestion of Ensure, gallbladder ejection fraction was determined. At 60 min, normal ejection fraction is greater than 33%. RADIOPHARMACEUTICALS:  5.4 mCi Tc-60m  Choletec IV COMPARISON:  April 28, 2021. FINDINGS: Prompt uptake and biliary excretion of activity by the liver is seen. Gallbladder activity is visualized, consistent with patency of cystic duct. Biliary activity passes into small bowel, consistent with patent common bile duct. Calculated gallbladder ejection fraction is 0%. No pain was reported with Ensure administration. 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 Electronically Signed   By: Marijo Conception M.D.   On: 05/01/2021 10:49      Scheduled Meds:  Chlorhexidine Gluconate Cloth  6 each Topical Q0600   dicyclomine  10 mg Oral TID AC & HS   feeding supplement  237 mL Oral TID BM   insulin aspart  0-15 Units Subcutaneous TID WC   insulin aspart  0-5 Units Subcutaneous QHS   insulin aspart  3 Units Subcutaneous TID WC   insulin  glargine-yfgn  8 Units Subcutaneous QHS   mouth rinse  15 mL Mouth Rinse BID   multivitamin with minerals  1 tablet Oral Daily   pantoprazole (PROTONIX) IV  40 mg Intravenous QHS   Continuous Infusions:  sodium chloride 75 mL/hr at 04/30/21 1048   ampicillin-sulbactam (UNASYN) IV 3 g (05/01/21 1143)     LOS: 7 days      Debbe Odea, MD [facility] Pager: [email or web address] 05/01/2021, 4:05 PM

## 2021-05-01 NOTE — Progress Notes (Signed)
Physical Therapy Treatment Patient Details Name: [name] MRN: [id number] DOB: 10-30-1940 Today's Date: 05/01/2021   History of Present Illness Patient is a 80 y.o. female with PMH: type 2 diabetes, hypertension and CKD stage III. She was brought to ED on 12/23 when daughter noted patient to be having change in mental status with patient being confused, had stool incontinence. In ED, UTI was revealed as per results from UA, patient having high fever and acute on chronic renal insufficiency. Blood cultures were positive for E.coli.    PT Comments    Pt is limited by fatigue today. She politely declines attempt to stand, she just got up to recliner with nursing staff. Agreeable to LE exercises. Pt reports she is hungry and has not eaten, RN addressing this.   Recommendations for follow up therapy are one component of a multi-disciplinary discharge planning process, led by the attending physician.  Recommendations may be updated based on patient status, additional functional criteria and insurance authorization.  Follow Up Recommendations  Home health PT     Assistance Recommended at Discharge Frequent or constant Supervision/Assistance  Equipment Recommendations  None recommended by PT    Recommendations for Other Services       Precautions / Restrictions Precautions Precautions: Fall Restrictions Weight Bearing Restrictions: No     Mobility  Bed Mobility               General bed mobility comments: In recliner    Transfers                   General transfer comment: pt states she is too tired to try standing or amb    Ambulation/Gait                   Stairs             Wheelchair Mobility    Modified Rankin (Stroke Patients Only)       Balance                                            Cognition Arousal/Alertness: Awake/alert Behavior During Therapy: WFL for tasks assessed/performed;Anxious Overall  Cognitive Status: Within Functional Limits for tasks assessed                                          Exercises General Exercises - Lower Extremity Ankle Circles/Pumps: AROM;Both;10 reps Quad Sets: AROM;Strengthening;Both;10 reps Gluteal Sets: AROM;Strengthening;Both;10 reps Heel Slides: AROM;AAROM;Both;10 reps;Limitations Heel Slides Limitations: L hip pain, limited to 6 reps Hip ABduction/ADduction: AROM;10 reps;Both;AAROM Straight Leg Raises: AROM;Strengthening;Both;5 reps    General Comments        Pertinent Vitals/Pain Pain Assessment: Faces Faces Pain Scale: Hurts little more Pain Location: lower abd and L hip Pain Descriptors / Indicators: Discomfort;Grimacing;Sore Pain Intervention(s): Limited activity within patient's tolerance;Monitored during session;Repositioned    Home Living                          Prior Function            PT Goals (current goals can now be found in the care plan section) Acute Rehab PT Goals Patient Stated Goal: to go home PT Goal Formulation: With patient Time For Goal  Achievement: 05/12/21 Potential to Achieve Goals: Good Progress towards PT goals: Progressing toward goals    Frequency    Min 3X/week      PT Plan Current plan remains appropriate    Co-evaluation              AM-PAC PT "6 Clicks" Mobility   Outcome Measure  Help needed turning from your back to your side while in a flat bed without using bedrails?: A Lot Help needed moving from lying on your back to sitting on the side of a flat bed without using bedrails?: A Lot Help needed moving to and from a bed to a chair (including a wheelchair)?: A Lot Help needed standing up from a chair using your arms (e.g., wheelchair or bedside chair)?: A Lot Help needed to walk in hospital room?: Total Help needed climbing 3-5 steps with a railing? : Total 6 Click Score: 10    End of Session   Activity Tolerance: Patient limited by  fatigue Patient left: in chair;with call bell/phone within reach;with family/visitor present Nurse Communication: Mobility status PT Visit Diagnosis: Unsteadiness on feet (R26.81);Muscle weakness (generalized) (M62.81)     Time: 6546-5035 PT Time Calculation (min) (ACUTE ONLY): 14 min  Charges:  $Therapeutic Exercise: 8-22 mins                     Baxter Flattery, PT  Acute Rehab Dept ([place]) [phone] Pager [phone]  05/01/2021    [facility] 05/01/2021, 11:50 AM

## 2021-05-02 LAB — BASIC METABOLIC PANEL
Anion gap: 12 (ref 5–15)
BUN: 47 mg/dL — ABNORMAL HIGH (ref 8–23)
CO2: 23 mmol/L (ref 22–32)
Calcium: 8.4 mg/dL — ABNORMAL LOW (ref 8.9–10.3)
Chloride: 109 mmol/L (ref 98–111)
Creatinine, Ser: 2.49 mg/dL — ABNORMAL HIGH (ref 0.44–1.00)
GFR, Estimated: 19 mL/min — ABNORMAL LOW (ref >60)
Glucose, Bld: 100 mg/dL — ABNORMAL HIGH (ref 70–99)
Potassium: 3.9 mmol/L (ref 3.5–5.1)
Sodium: 144 mmol/L (ref 135–145)

## 2021-05-02 LAB — CBC
HCT: 26.3 % — ABNORMAL LOW (ref 36.0–46.0)
Hemoglobin: 8.3 g/dL — ABNORMAL LOW (ref 12.0–15.0)
MCH: 29.3 pg (ref 26.0–34.0)
MCHC: 31.6 g/dL (ref 30.0–36.0)
MCV: 92.9 fL (ref 80.0–100.0)
Platelets: 197 10*3/uL (ref 150–400)
RBC: 2.83 MIL/uL — ABNORMAL LOW (ref 3.87–5.11)
RDW: 16.3 % — ABNORMAL HIGH (ref 11.5–15.5)
WBC: 15.9 10*3/uL — ABNORMAL HIGH (ref 4.0–10.5)
nRBC: 0.2 % (ref 0.0–0.2)

## 2021-05-02 LAB — GLUCOSE, CAPILLARY
Glucose-Capillary: 99 mg/dL (ref 70–99)
Glucose-Capillary: 91 mg/dL (ref 70–99)

## 2021-05-02 MED ORDER — AMOXICILLIN-POT CLAVULANATE 875-125 MG PO TABS: 1.0000 | ORAL_TABLET | Freq: Two times a day (BID) | ORAL | 0 refills | Status: AC

## 2021-05-02 NOTE — Progress Notes (Signed)
Pt discharging home. AVS printed and educational teaching completed with teach back method. Pt daughter at bedside during teaching. PIV removed. Pt has all belongings. No further questions at this time.

## 2021-05-02 NOTE — TOC Transition Note (Signed)
Transition of Care [facility]) - CM/SW Discharge Note   Patient Details  Name: [name] MRN: [id number] Date of Birth: 08-21-1940  Transition of Care [facility]) CM/SW Contact:  Leeroy Cha, RN Phone Number: 05/02/2021, 12:55 PM   Clinical Narrative:    Patient discharged to home with no needs.   Final next level of care: Home/Self Care Barriers to Discharge: Barriers Resolved   Patient Goals and CMS Choice Patient states their goals for this hospitalization and ongoing recovery are:: to go home      Discharge Placement                       Discharge Plan and Services   Discharge Planning Services: CM Consult                                 Social Determinants of Health (SDOH) Interventions     Readmission Risk Interventions No flowsheet data found.

## 2021-05-02 NOTE — Discharge Instructions (Signed)
Please check your sugars at least 2 x a day prior to meals at home and call PCP if consistently > 300 Drink at least 1.5 L of caffeine free liquids per day for now. [facility] is on hold.  You will need to f/u with your PCP in 1 wk, have your blood work checked and PCP will help you decide if you can resume some of your meds  You were cared for by a hospitalist during your hospital stay. If you have any questions about your discharge medications or the care you received while you were in the hospital after you are discharged, you can call the unit and asked to speak with the hospitalist on call if the hospitalist that took care of you is not available. Once you are discharged, your primary care physician will handle any further medical issues.   Please note that NO REFILLS for any discharge medications will be authorized once you are discharged, as it is imperative that you return to your primary care physician (or establish a relationship with a primary care physician if you do not have one) for your aftercare needs so that they can reassess your need for medications and monitor your lab values.  Please take all your medications with you for your next visit with your Primary MD. Please ask your Primary MD to get all Hospital records sent to his/her office. Please request your Primary MD to go over all hospital test results at the follow up.   If you experience worsening of your admission symptoms, develop shortness of breath, chest pain, suicidal or homicidal thoughts or a life threatening emergency, you must seek medical attention immediately by calling 911 or calling your MD.   Dennis Bast must read the complete instructions/literature along with all the possible adverse reactions/side effects for all the medicines you take including new medications that have been prescribed to you. Take new medicines after you have completely understood and accpet all the possible adverse reactions/side effects.    Do not  drive when taking pain medications or sedatives.     Do not take more than prescribed Pain, Sleep and Anxiety Medications   If you have smoked or chewed Tobacco in the last 2 yrs please stop. Stop any regular alcohol  and or recreational drug use.   Wear Seat belts while driving.

## 2021-05-02 NOTE — Discharge Summary (Signed)
Physician Discharge Summary  Ieisha Gao CWC:[id number] DOB: [id number] DOA: 04/24/2021  PCP: Trey Sailors, PA  Admit date: 04/24/2021 Discharge date: 05/02/2021  Admitted From: home  Disposition:  home   Recommendations for Outpatient Follow-up:  She will need a bmet and CBC in 1 wk- many meds are on hold and will need to be readdressed after she is evaluated  Discharge Condition:  stable   CODE STATUS:  Full code   Diet recommendation:  heart healthy Consultations: Nephrology Procedures/Studies: none   Discharge Diagnoses:  Principal Problem:   Sepsis with acute organ dysfunction without septic shock ([place]) Active Problems:   Essential hypertension   Persistent atrial fibrillation ([place])   DM (diabetes mellitus), type 2 with renal complications ([place])   Acute kidney injury superimposed on chronic kidney disease ([place])   Acute cystitis without hematuria   Bacteremia   Confusion   Wheezing   Malnutrition of moderate degree     Brief Summary: 80da Paschal with diabetes mellitus type 2, CKD stage IIIb, hypertension, persistent A. fib presented to the hospital for confusion and incontinence of stool.  In the ED the patient was noted to have a temperature of 103 degrees, lactic acid of 3.1 and a UA that was consistent with a UTI.  WBC count was 9, BUN 33 and creatinine 2.4.  She was given IV fluids and started on ceftriaxone.  She later was found to have E. coli UTI and bacteremia.  Her incontinence and diarrhea resolved spontaneously.  Her renal function progressively got worse and was treated with IV fluid boluses on continuous fluids.  A nephrology consult was requested  Hospital Course:  Principal Problem:   Sepsis with acute organ dysfunction without septic shock ([place]) Secondary to E. coli UTI and bacteremia - Sepsis has resolved - Per sensitivities, we can switch Unasyn to Keflex which was done on 12/28-  - 12/29> oddly, WBC count & platelets worse   - renal  ultrasound showed gallbladder wall thickening- she has some mild tenderness but no vomiting or overt pain.- resumed Unasyn - HIDA scan ordered - 12/30> HIDA neg for cholecystitis-  - WBC slightly better - 12/31> WBC continue to improve- will dc home today with Augmentin   Active Problems:   Acute kidney injury superimposed on chronic kidney disease is 3B - Prerenal -   - She takes Lasix at home which is on hold -Echocardiogram from 09/06/2020 revealed an EF of 55 to 76%-HY diastolic function could not be evaluated-RV systolic function was moderately reduced with severely elevated pulmonary artery pressures - drinking fluids better now- can dc IVF- recommending holding Lasix until f/u vist with PCP   H/o RV dysfunction ECHO 09/06/20> The right ventricular size is severely enlarged. No  increase in right ventricular wall thickness. Right ventricular systolic  function is moderately reduced. There is severely elevated pulmonary  artery systolic pressure.    Hypotension with h/o Essential hypertension - home antihypertensives held during hospital stay due to hypotension - BP has improved- 132/63 today- will resume Amlodipine   Paroxysmal atrial fibrillation ([place]) -Mali Vasc 5-cardioverted in [place] longer on anticoagulation per cardiology (Dr. Einar Gip) -Currently rate controlled in normal sinus rhythm     DM (diabetes mellitus), type 2 with renal complications ([place]) - resume Humulin 70/30 - cont to hold Amaryl for now as Cr still elevated from baseline     Malnutrition of moderate degree Body mass index is 31.86 kg/m.       Discharge Exam: Vitals:  05/01/21 2038 05/02/21 0610  BP: 129/82 132/63  Pulse: 72 79  Resp: 20 20  Temp: 98.6 F (37 C) 98.7 F (37.1 C)  SpO2: 100% 99%   Vitals:   05/01/21 1425 05/01/21 2038 05/02/21 0500 05/02/21 0610  BP: (!) 152/76 129/82  132/63  Pulse: 85 72  79  Resp: _0 Temp: 98.1 F (36.7 C) 98.6 F (37 C)  98.7 F (37.1  C)  TempSrc: Oral Oral  Oral  SpO2: 99% 100%  99%  Weight:   82.9 kg   Height:        General: Pt is alert, awake, not in acute distress Cardiovascular: RRR, S1/S2 +, no rubs, no gallops Respiratory: CTA bilaterally, no wheezing, no rhonchi Abdominal: Soft, NT, ND, bowel sounds + Extremities: no edema, no cyanosis   Discharge Instructions  Discharge Instructions     Diet - low sodium heart healthy   Complete by: As directed    Diet Carb Modified   Complete by: As directed    Increase activity slowly   Complete by: As directed       Allergies as of 05/02/2021       Reactions   Peanut-containing Drug Products    Stomach pain   Amlodipine Swelling        Medication List     STOP taking these medications    diltiazem 180 MG 24 hr capsule Commonly known as: CARDIZEM CD   furosemide 20 MG tablet Commonly known as: LASIX   hydrALAZINE 50 MG tablet Commonly known as: APRESOLINE   isosorbide dinitrate 30 MG tablet Commonly known as: ISORDIL   olmesartan 20 MG tablet Commonly known as: BENICAR       TAKE these medications    Accu-Chek FastClix Lancets Misc See admin instructions.   Accu-Chek Guide Me w/Device Kit See admin instructions.   amLODipine 10 MG tablet Commonly known as: NORVASC Take 10 mg by mouth daily.   amoxicillin-clavulanate 875-125 MG tablet Commonly known as: Augmentin Take 1 tablet by mouth every 12 (twelve) hours for 5 days.   BD Pen Needle Nano 2nd Gen 32G X 4 MM Misc Generic drug: Insulin Pen Needle 3 (three) times daily. as directed   glucose blood test strip TEST BLOOD SUGARS 3 4 TIMES DAILY.   HumuLIN 70/30 KwikPen (70-30) 100 UNIT/ML KwikPen Generic drug: insulin isophane & regular human KwikPen Inject 15 Units into the skin at bedtime.   MUSCLE RUB EX Apply 1 application topically daily as needed (leg cramps).   pravastatin 20 MG tablet Commonly known as: PRAVACHOL Take 1 tablet (20 mg total) by mouth  daily. What changed: when to take this        Follow-up Information     Trey Sailors, PA Follow up in 1 week(s).   Specialty: Physician Assistant Why: Please call to make appt in 1 wk for blood work and BP check You will need the following blood work> Advertising account executive information: [address] [place] [address] [phone]                Allergies  Allergen Reactions   Peanut-Containing Drug Products     Stomach pain   Amlodipine Swelling      CT ABDOMEN PELVIS WO CONTRAST  Result Date: 04/25/2021 CLINICAL DATA:  AK I, history of renal stone. EXAM: CT ABDOMEN AND PELVIS WITHOUT CONTRAST TECHNIQUE: Multidetector CT imaging of the abdomen and pelvis was performed following the standard protocol  without IV contrast. COMPARISON:  CT January 16, 2021 FINDINGS: Lower chest: Small right greater than left pleural effusions. Calcified granuloma in the right lower lobe. Hepatobiliary: Hepatomegaly measuring 20.6 cm in maximum craniocaudal dimension. No hepatic lesion visualized on this noncontrast examination. Periportal edema, possibly related to fluid resuscitation. Cholelithiasis without findings of acute cholecystitis. No biliary ductal dilation. Pancreas: No pancreatic ductal dilation or evidence of acute inflammation. Spleen: Within normal limits. Adrenals/Urinary Tract: Similar left adrenal thickening. Right adrenal gland appears normal. No hydronephrosis. No nephrolithiasis. No obstructive ureteral or bladder calculi identified. Calcifications along the bilateral ureters appear to be associated with the gonadal veins likely reflecting phleboliths. Urinary bladder is decompressed around a Foley catheter. Stomach/Bowel: No enteric contrast was administered. No evidence of bowel obstruction. No evidence of acute bowel wall thickening or inflammatory changes. Vascular/Lymphatic: Aortic and branch vessel atherosclerosis without abdominal aortic aneurysm. No pathologically  enlarged abdominal or pelvic lymph nodes. Reproductive: Calcified uterine leiomyomas. No suspicious adnexal mass identified. Other: Small volume abdominopelvic ascites, similar prior. Diffuse body wall edema. Musculoskeletal: Similar mild compression deformity of the L2 superior endplate. No acute osseous abnormality. IMPRESSION: 1. No evidence of obstructive ureteral or bladder calculi. No hydronephrosis. 2. Small right greater than left pleural effusions with small volume abdominopelvic ascites and body wall edema. 3. Hepatomegaly. 4. Cholelithiasis without findings of acute cholecystitis. 5.  Aortic Atherosclerosis (ICD10-I70.0). Electronically Signed   By: Dahlia Bailiff M.D.   On: 04/25/2021 22:07   US RENAL  Result Date: 04/27/2021 CLINICAL DATA:  Acute renal failure EXAM: RENAL / URINARY TRACT ULTRASOUND COMPLETE COMPARISON:  CT 04/25/2021 FINDINGS: Right Kidney: Renal measurements: 11.4 x 3.8 x 3.5 cm = volume: 79.8 mL. Cortex slightly echogenic. No mass or hydronephrosis. Left Kidney: Renal measurements: 10.6 x 4.4 x 5.3 cm = volume: 128.4 mL. Cortex slightly echogenic. No mass or hydronephrosis. Bladder: Decompressed by Foley catheter. Other: Small amount of free fluid in the pelvis. Small right pleural effusion. Enlarged appearing liver. IMPRESSION: 1. Slightly echogenic kidneys consistent with medical renal disease. No hydronephrosis. 2. Small right pleural effusion and small amount of ascites in the pelvis. Electronically Signed   By: Donavan Foil M.D.   On: 04/27/2021 20:55   NM Hepato W/EF  Result Date: 05/01/2021 CLINICAL DATA:  Right upper quadrant abdominal pain. EXAM: NUCLEAR MEDICINE HEPATOBILIARY IMAGING WITH GALLBLADDER EF TECHNIQUE: Sequential images of the abdomen were obtained out to 60 minutes following intravenous administration of radiopharmaceutical. After oral ingestion of Ensure, gallbladder ejection fraction was determined. At 60 min, normal ejection fraction is greater  than 33%. RADIOPHARMACEUTICALS:  5.4 mCi Tc-28m Choletec IV COMPARISON:  April 28, 2021. FINDINGS: Prompt uptake and biliary excretion of activity by the liver is seen. Gallbladder activity is visualized, consistent with patency of cystic duct. Biliary activity passes into small bowel, consistent with patent common bile duct. Calculated gallbladder ejection fraction is 0%. No pain was reported with Ensure administration. 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 Electronically Signed   By: JMarijo ConceptionM.D.   On: 05/01/2021 10:49   DG CHEST PORT 1 VIEW  Result Date: 04/25/2021 CLINICAL DATA:  Wheezing EXAM: PORTABLE CHEST 1 VIEW COMPARISON:  April 24, 2021 FINDINGS: The cardiomediastinal silhouette is unchanged in contour.Atherosclerotic calcifications. No pleural effusion. No pneumothorax. No acute pleuroparenchymal abnormality. Visualized abdomen is unremarkable. IMPRESSION: No acute cardiopulmonary abnormality. Electronically Signed  By: Valentino Saxon M.D.   On: 04/25/2021 17:57   DG Chest Port 1 View  Result Date: 04/24/2021 CLINICAL DATA:  Possible sepsis EXAM: PORTABLE CHEST 1 VIEW COMPARISON:  09/06/2020 FINDINGS: Cardiac shadow is mildly prominent but stable. Calcified granulomas are noted bilaterally. No focal infiltrate or effusion is seen. No bony abnormality is noted. IMPRESSION: No active disease. Electronically Signed   By: Inez Catalina M.D.   On: 04/24/2021 19:00   US Abdomen Limited RUQ (LIVER/GB)  Result Date: 04/28/2021 CLINICAL DATA:  Abnormal LFTs EXAM: ULTRASOUND ABDOMEN LIMITED RIGHT UPPER QUADRANT COMPARISON:  CT 04/25/2021 FINDINGS: Gallbladder: Gallbladder wall thickening up to 8.9 mm. Moderate pericholecystic fluid. Sonographer describes no sonographic Murphy's sign however. Layering echogenic  material in the fundus possibly small stones or sludge. Common bile duct: Diameter: 9 mm Liver: No focal lesion identified. Within normal limits in parenchymal echogenicity. Portal vein is patent on color Doppler imaging with normal direction of blood flow towards the liver. Other: Small volume abdominal ascites. Right pleural effusion noted. IMPRESSION: 1. Nonspecific but marked gallbladder wall thickening, with layering small stones or sludge. 2. Dilated common bile duct. 3. Small volume abdominal ascites including pericholecystic fluid, and right pleural effusion incidentally noted. Electronically Signed   By: Lucrezia Europe M.D.   On: 04/28/2021 17:19     The results of significant diagnostics from this hospitalization (including imaging, microbiology, ancillary and laboratory) are listed below for reference.     Microbiology: Recent Results (from the past 240 hour(s))  Blood Culture (routine x 2)     Status: Abnormal   Collection Time: 04/24/21  6:21 PM   Specimen: BLOOD  Result Value Ref Range Status   Specimen Description   Final    BLOOD LEFT ANTECUBITAL Performed at [facility] [address], [place], [place] [address]    Special Requests   Final    BOTTLES DRAWN AEROBIC AND ANAEROBIC Blood Culture results may not be optimal due to an excessive volume of blood received in culture bottles Performed at [facility] [address] [place], [place] [address]    Culture  Setup Time   Final    GRAM NEGATIVE RODS IN BOTH AEROBIC AND ANAEROBIC BOTTLES CRITICAL RESULT CALLED TO, READ BACK BY AND VERIFIED WITH: D,WOFFORD PHARMD _0  04/25/21 EB Performed at [facility] [address], [place], [place] [address]    Culture ESCHERICHIA COLI (A)  Final   Report Status 04/27/2021 FINAL  Final   Organism ID, Bacteria ESCHERICHIA COLI  Final      Susceptibility   Escherichia coli - MIC*    AMPICILLIN <=2 SENSITIVE Sensitive     CEFAZOLIN <=4  SENSITIVE Sensitive     CEFEPIME <=0.12 SENSITIVE Sensitive     CEFTAZIDIME <=1 SENSITIVE Sensitive     CEFTRIAXONE <=0.25 SENSITIVE Sensitive     CIPROFLOXACIN <=0.25 SENSITIVE Sensitive     GENTAMICIN <=1 SENSITIVE Sensitive     IMIPENEM <=0.25 SENSITIVE Sensitive     TRIMETH/SULFA <=20 SENSITIVE Sensitive     AMPICILLIN/SULBACTAM <=2 SENSITIVE Sensitive     PIP/TAZO <=4 SENSITIVE Sensitive     * ESCHERICHIA COLI  Blood Culture ID Panel (Reflexed)     Status: Abnormal   Collection Time: 04/24/21  6:21 PM  Result Value Ref Range Status   Enterococcus faecalis NOT DETECTED NOT DETECTED Final   Enterococcus Faecium NOT DETECTED NOT DETECTED Final   Listeria monocytogenes NOT DETECTED NOT DETECTED Final  Staphylococcus species NOT DETECTED NOT DETECTED Final   Staphylococcus aureus (BCID) NOT DETECTED NOT DETECTED Final   Staphylococcus epidermidis NOT DETECTED NOT DETECTED Final   Staphylococcus lugdunensis NOT DETECTED NOT DETECTED Final   Streptococcus species NOT DETECTED NOT DETECTED Final   Streptococcus agalactiae NOT DETECTED NOT DETECTED Final   Streptococcus pneumoniae NOT DETECTED NOT DETECTED Final   Streptococcus pyogenes NOT DETECTED NOT DETECTED Final   A.calcoaceticus-baumannii NOT DETECTED NOT DETECTED Final   Bacteroides fragilis NOT DETECTED NOT DETECTED Final   Enterobacterales DETECTED (A) NOT DETECTED Final    Comment: Enterobacterales represent a large order of gram negative bacteria, not a single organism. CRITICAL RESULT CALLED TO, READ BACK BY AND VERIFIED WITH: D,WOFFORD PHARMD _0  04/25/21 EB    Enterobacter cloacae complex NOT DETECTED NOT DETECTED Final   Escherichia coli DETECTED (A) NOT DETECTED Final    Comment: CRITICAL RESULT CALLED TO, READ BACK BY AND VERIFIED WITH: D,WOFFORD PHARMD _1  04/25/21 EB    Klebsiella aerogenes NOT DETECTED NOT DETECTED Final   Klebsiella oxytoca NOT DETECTED NOT DETECTED Final   Klebsiella pneumoniae NOT  DETECTED NOT DETECTED Final   Proteus species NOT DETECTED NOT DETECTED Final   Salmonella species NOT DETECTED NOT DETECTED Final   Serratia marcescens NOT DETECTED NOT DETECTED Final   Haemophilus influenzae NOT DETECTED NOT DETECTED Final   Neisseria meningitidis NOT DETECTED NOT DETECTED Final   Pseudomonas aeruginosa NOT DETECTED NOT DETECTED Final   Stenotrophomonas maltophilia NOT DETECTED NOT DETECTED Final   Candida albicans NOT DETECTED NOT DETECTED Final   Candida auris NOT DETECTED NOT DETECTED Final   Candida glabrata NOT DETECTED NOT DETECTED Final   Candida krusei NOT DETECTED NOT DETECTED Final   Candida parapsilosis NOT DETECTED NOT DETECTED Final   Candida tropicalis NOT DETECTED NOT DETECTED Final   Cryptococcus neoformans/gattii NOT DETECTED NOT DETECTED Final   CTX-M ESBL NOT DETECTED NOT DETECTED Final   Carbapenem resistance IMP NOT DETECTED NOT DETECTED Final   Carbapenem resistance KPC NOT DETECTED NOT DETECTED Final   Carbapenem resistance NDM NOT DETECTED NOT DETECTED Final   Carbapenem resist OXA 48 LIKE NOT DETECTED NOT DETECTED Final   Carbapenem resistance VIM NOT DETECTED NOT DETECTED Final    Comment: Performed at [facility], [address], [place], [place] [address]  Resp Panel by RT-PCR (Flu A&B, Covid) Nasopharyngeal Swab     Status: None   Collection Time: 04/24/21  6:24 PM   Specimen: Nasopharyngeal Swab; Nasopharyngeal(NP) swabs in vial transport medium  Result Value Ref Range Status   SARS Coronavirus 2 by RT PCR NEGATIVE NEGATIVE Final    Comment: (NOTE) SARS-CoV-2 target nucleic acids are NOT DETECTED.  The SARS-CoV-2 RNA is generally detectable in upper respiratory specimens during the acute phase of infection. The lowest concentration of SARS-CoV-2 viral copies this assay can detect is 138 copies/mL. A negative result does not preclude SARS-Cov-2 infection and should not be used as the sole basis for treatment or other patient  management decisions. A negative result may occur with  improper specimen collection/handling, submission of specimen other than nasopharyngeal swab, presence of viral mutation(s) within the areas targeted by this assay, and inadequate number of viral copies(<138 copies/mL). A negative result must be combined with clinical observations, patient history, and epidemiological information. The expected result is Negative.  Fact Sheet for Patients:  EntrepreneurPulse.com.au  Fact Sheet for Healthcare Providers:  IncredibleEmployment.be  This test is no t yet approved or cleared by  the [employer] and  has been authorized for detection and/or diagnosis of SARS-CoV-2 by FDA under an Emergency Use Authorization (EUA). This EUA will remain  in effect (meaning this test can be used) for the duration of the COVID-19 declaration under Section 564(b)(1) of the Act, 21 U.S.C.section 360bbb-3(b)(1), unless the authorization is terminated  or revoked sooner.       Influenza A by PCR NEGATIVE NEGATIVE Final   Influenza B by PCR NEGATIVE NEGATIVE Final    Comment: (NOTE) The Xpert Xpress SARS-CoV-2/FLU/RSV plus assay is intended as an aid in the diagnosis of influenza from Nasopharyngeal swab specimens and should not be used as a sole basis for treatment. Nasal washings and aspirates are unacceptable for Xpert Xpress SARS-CoV-2/FLU/RSV testing.  Fact Sheet for Patients: EntrepreneurPulse.com.au  Fact Sheet for Healthcare Providers: IncredibleEmployment.be  This test is not yet approved or cleared by the [place] FDA and has been authorized for detection and/or diagnosis of SARS-CoV-2 by FDA under an Emergency Use Authorization (EUA). This EUA will remain in effect (meaning this test can be used) for the duration of the COVID-19 declaration under Section 564(b)(1) of the Act, 21 U.S.C. section 360bbb-3(b)(1),  unless the authorization is terminated or revoked.  Performed at [facility], [place] [address] [place], [place] [address]   Urine Culture     Status: Abnormal   Collection Time: 04/24/21  6:35 PM   Specimen: In/Out Cath Urine  Result Value Ref Range Status   Specimen Description   Final    IN/OUT CATH URINE Performed at [facility] [address], [place], [place] [address]    Special Requests   Final    NONE Performed at [facility], [place] [address], [place], [place] [address]    Culture >=100,000 COLONIES/mL ESCHERICHIA COLI (A)  Final   Report Status 04/27/2021 FINAL  Final   Organism ID, Bacteria ESCHERICHIA COLI (A)  Final      Susceptibility   Escherichia coli - MIC*    AMPICILLIN <=2 SENSITIVE Sensitive     CEFAZOLIN <=4 SENSITIVE Sensitive     CEFEPIME <=0.12 SENSITIVE Sensitive     CEFTRIAXONE <=0.25 SENSITIVE Sensitive     CIPROFLOXACIN <=0.25 SENSITIVE Sensitive     GENTAMICIN <=1 SENSITIVE Sensitive     IMIPENEM <=0.25 SENSITIVE Sensitive     NITROFURANTOIN <=16 SENSITIVE Sensitive     TRIMETH/SULFA <=20 SENSITIVE Sensitive     AMPICILLIN/SULBACTAM <=2 SENSITIVE Sensitive     PIP/TAZO <=4 SENSITIVE Sensitive     * >=100,000 COLONIES/mL ESCHERICHIA COLI  Blood Culture (routine x 2)     Status: Abnormal   Collection Time: 04/24/21  8:31 PM   Specimen: BLOOD  Result Value Ref Range Status   Specimen Description   Final    BLOOD Performed at [facility] [address] [place], [place] [address]    Special Requests   Final    BOTTLES DRAWN AEROBIC AND ANAEROBIC Blood Culture adequate volume Performed at [facility] [address] [place], [place] [address]    Culture  Setup Time   Final    GRAM NEGATIVE RODS IN BOTH AEROBIC AND ANAEROBIC BOTTLES CRITICAL VALUE NOTED.  VALUE IS CONSISTENT WITH PREVIOUSLY REPORTED AND CALLED VALUE.    Culture (A)  Final     ESCHERICHIA COLI SUSCEPTIBILITIES PERFORMED ON PREVIOUS CULTURE WITHIN THE LAST 5 DAYS. Performed at [facility], [place],  [place] [address]    Report Status 04/27/2021 FINAL  Final  MRSA Next Gen by PCR, Nasal     Status: None   Collection Time: 04/25/21  4:50 PM   Specimen: Nasal Mucosa; Nasal Swab  Result Value Ref Range Status   MRSA by PCR Next Gen NOT DETECTED NOT DETECTED Final    Comment: (NOTE) The GeneXpert MRSA Assay (FDA approved for NASAL specimens only), is one component of a comprehensive MRSA colonization surveillance program. It is not intended to diagnose MRSA infection nor to guide or monitor treatment for MRSA infections. Test performance is not FDA approved in patients less than 77 years old. Performed at [facility], [place] [address], [place], [place] [address]   C Difficile Quick Screen w PCR reflex     Status: None   Collection Time: 04/26/21  4:58 AM   Specimen: STOOL  Result Value Ref Range Status   C Diff antigen NEGATIVE NEGATIVE Final   C Diff toxin NEGATIVE NEGATIVE Final   C Diff interpretation No C. difficile detected.  Final    Comment: Performed at [facility], [place] [address] [place],  [address]     Labs: BNP (last 3 results) Recent Labs    01/15/21 1506 01/16/21 1755 02/03/21 1448  BNP 465.6* 596.2* 132.4*   Basic Metabolic Panel: Recent Labs  Lab 04/26/21 0303 04/27/21 0745 04/28/21 0257 04/29/21 0525 04/30/21 0514 05/01/21 0544 05/02/21 0838  NA 139   < > 135 137 137 135 144  K 3.7   < > 3.7 3.6 3.6 3.7 3.9  CL 107   < > 105 109 106 106 109  CO2 22   < > 19* 20* 20* 21* 23  GLUCOSE 132*   < > 380* 126* 128* 139* 100*  BUN 51*   < > 67* 63* 57* 55* 47*  CREATININE 3.69*   < > 3.51* 3.28* 3.02* 2.78* 2.49*  CALCIUM 7.8*   < > 7.8* 8.3* 8.3* 7.9* 8.4*  MG 1.8  --  1.9 2.1  --   --   --   PHOS 3.8  --  2.8 2.7  --   --   --    < > = values in this interval  not displayed.   Liver Function Tests: Recent Labs  Lab 04/26/21 0303 04/27/21 0745 04/28/21 0257 04/29/21 0525  AST 106* 78* 55* 37  ALT 49* 54* 48* 38  ALKPHOS 63 65 69 71  BILITOT 1.0 0.8 0.6 0.8  PROT 6.5 6.3* 5.7* 6.0*  ALBUMIN 3.0* 2.7* 2.5* 2.6*   No results for input(s): LIPASE, AMYLASE in the last 168 hours. No results for input(s): AMMONIA in the last 168 hours. CBC: Recent Labs  Lab 04/26/21 0303 04/27/21 0745 04/28/21 0257 04/29/21 0525 04/30/21 0509 05/01/21 0544 05/02/21 0838  WBC 17.1* 17.9* 14.1* 18.3* 19.3* 17.0* 15.9*  NEUTROABS 12.0* 15.3* 11.3* 13.0*  --   --   --   HGB 9.5* 8.5* 8.6* 8.7* 8.3* 8.2* 8.3*  HCT 29.1* 26.1* 26.6* 26.3* 25.8* 25.2* 26.3*  MCV 89.5 90.3 89.0 88.9 89.6 90.6 92.9  PLT 66* 75* 68* 83* 108* 152 197   Cardiac Enzymes: No results for input(s): CKTOTAL, CKMB, CKMBINDEX, TROPONINI in the last 168 hours. BNP: Invalid input(s): POCBNP CBG: Recent Labs  Lab 04/30/21 2107 05/01/21 1126 05/01/21 1708 05/01/21 2039 05/02/21 0749  GLUCAP 116* 166* 303* 190* 99   D-Dimer No results for input(s): DDIMER in the  last 72 hours. Hgb A1c No results for input(s): HGBA1C in the last 72 hours. Lipid Profile No results for input(s): CHOL, HDL, LDLCALC, TRIG, CHOLHDL, LDLDIRECT in the last 72 hours. Thyroid function studies No results for input(s): TSH, T4TOTAL, T3FREE, THYROIDAB in the last 72 hours.  Invalid input(s): FREET3 Anemia work up No results for input(s): VITAMINB12, FOLATE, FERRITIN, TIBC, IRON, RETICCTPCT in the last 72 hours. Urinalysis    Component Value Date/Time   COLORURINE YELLOW 04/24/2021 1835   APPEARANCEUR CLOUDY (A) 04/24/2021 1835   LABSPEC 1.020 04/24/2021 1835   PHURINE 6.0 04/24/2021 1835   GLUCOSEU NEGATIVE 04/24/2021 1835   HGBUR LARGE (A) 04/24/2021 1835   BILIRUBINUR NEGATIVE 04/24/2021 1835   KETONESUR NEGATIVE 04/24/2021 1835   PROTEINUR >300 (A) 04/24/2021 1835   NITRITE POSITIVE (A)  04/24/2021 1835   LEUKOCYTESUR MODERATE (A) 04/24/2021 1835   Sepsis Labs Invalid input(s): PROCALCITONIN,  WBC,  LACTICIDVEN Microbiology Recent Results (from the past 240 hour(s))  Blood Culture (routine x 2)     Status: Abnormal   Collection Time: 04/24/21  6:21 PM   Specimen: BLOOD  Result Value Ref Range Status   Specimen Description   Final    BLOOD LEFT ANTECUBITAL Performed at [facility], [place] [address], [place], [place] [address]    Special Requests   Final    BOTTLES DRAWN AEROBIC AND ANAEROBIC Blood Culture results may not be optimal due to an excessive volume of blood received in culture bottles Performed at [facility] [address] [place], [place] [address]    Culture  Setup Time   Final    GRAM NEGATIVE RODS IN BOTH AEROBIC AND ANAEROBIC BOTTLES CRITICAL RESULT CALLED TO, READ BACK BY AND VERIFIED WITH: D,WOFFORD PHARMD _0  04/25/21 EB Performed at [facility] [address] [place], [place] [address]    Culture ESCHERICHIA COLI (A)  Final   Report Status 04/27/2021 FINAL  Final   Organism ID, Bacteria ESCHERICHIA COLI  Final      Susceptibility   Escherichia coli - MIC*    AMPICILLIN <=2 SENSITIVE Sensitive     CEFAZOLIN <=4 SENSITIVE Sensitive     CEFEPIME <=0.12 SENSITIVE Sensitive     CEFTAZIDIME <=1 SENSITIVE Sensitive     CEFTRIAXONE <=0.25 SENSITIVE Sensitive     CIPROFLOXACIN <=0.25 SENSITIVE Sensitive     GENTAMICIN <=1 SENSITIVE Sensitive     IMIPENEM <=0.25 SENSITIVE Sensitive     TRIMETH/SULFA <=20 SENSITIVE Sensitive     AMPICILLIN/SULBACTAM <=2 SENSITIVE Sensitive     PIP/TAZO <=4 SENSITIVE Sensitive     * ESCHERICHIA COLI  Blood Culture ID Panel (Reflexed)     Status: Abnormal   Collection Time: 04/24/21  6:21 PM  Result Value Ref Range Status   Enterococcus faecalis NOT DETECTED NOT DETECTED Final   Enterococcus Faecium NOT DETECTED NOT DETECTED Final   Listeria monocytogenes NOT  DETECTED NOT DETECTED Final   Staphylococcus species NOT DETECTED NOT DETECTED Final   Staphylococcus aureus (BCID) NOT DETECTED NOT DETECTED Final   Staphylococcus epidermidis NOT DETECTED NOT DETECTED Final   Staphylococcus lugdunensis NOT DETECTED NOT DETECTED Final   Streptococcus species NOT DETECTED NOT DETECTED Final   Streptococcus agalactiae NOT DETECTED NOT DETECTED Final   Streptococcus pneumoniae NOT DETECTED NOT DETECTED Final   Streptococcus pyogenes NOT DETECTED NOT DETECTED Final   A.calcoaceticus-baumannii NOT DETECTED NOT DETECTED Final   Bacteroides fragilis NOT DETECTED NOT DETECTED Final   Enterobacterales  DETECTED (A) NOT DETECTED Final    Comment: Enterobacterales represent a large order of gram negative bacteria, not a single organism. CRITICAL RESULT CALLED TO, READ BACK BY AND VERIFIED WITH: D,WOFFORD PHARMD _0  04/25/21 EB    Enterobacter cloacae complex NOT DETECTED NOT DETECTED Final   Escherichia coli DETECTED (A) NOT DETECTED Final    Comment: CRITICAL RESULT CALLED TO, READ BACK BY AND VERIFIED WITH: D,WOFFORD PHARMD _1  04/25/21 EB    Klebsiella aerogenes NOT DETECTED NOT DETECTED Final   Klebsiella oxytoca NOT DETECTED NOT DETECTED Final   Klebsiella pneumoniae NOT DETECTED NOT DETECTED Final   Proteus species NOT DETECTED NOT DETECTED Final   Salmonella species NOT DETECTED NOT DETECTED Final   Serratia marcescens NOT DETECTED NOT DETECTED Final   Haemophilus influenzae NOT DETECTED NOT DETECTED Final   Neisseria meningitidis NOT DETECTED NOT DETECTED Final   Pseudomonas aeruginosa NOT DETECTED NOT DETECTED Final   Stenotrophomonas maltophilia NOT DETECTED NOT DETECTED Final   Candida albicans NOT DETECTED NOT DETECTED Final   Candida auris NOT DETECTED NOT DETECTED Final   Candida glabrata NOT DETECTED NOT DETECTED Final   Candida krusei NOT DETECTED NOT DETECTED Final   Candida parapsilosis NOT DETECTED NOT DETECTED Final   Candida  tropicalis NOT DETECTED NOT DETECTED Final   Cryptococcus neoformans/gattii NOT DETECTED NOT DETECTED Final   CTX-M ESBL NOT DETECTED NOT DETECTED Final   Carbapenem resistance IMP NOT DETECTED NOT DETECTED Final   Carbapenem resistance KPC NOT DETECTED NOT DETECTED Final   Carbapenem resistance NDM NOT DETECTED NOT DETECTED Final   Carbapenem resist OXA 48 LIKE NOT DETECTED NOT DETECTED Final   Carbapenem resistance VIM NOT DETECTED NOT DETECTED Final    Comment: Performed at [facility], [address] [place], [place] [address]  Resp Panel by RT-PCR (Flu A&B, Covid) Nasopharyngeal Swab     Status: None   Collection Time: 04/24/21  6:24 PM   Specimen: Nasopharyngeal Swab; Nasopharyngeal(NP) swabs in vial transport medium  Result Value Ref Range Status   SARS Coronavirus 2 by RT PCR NEGATIVE NEGATIVE Final    Comment: (NOTE) SARS-CoV-2 target nucleic acids are NOT DETECTED.  The SARS-CoV-2 RNA is generally detectable in upper respiratory specimens during the acute phase of infection. The lowest concentration of SARS-CoV-2 viral copies this assay can detect is 138 copies/mL. A negative result does not preclude SARS-Cov-2 infection and should not be used as the sole basis for treatment or other patient management decisions. A negative result may occur with  improper specimen collection/handling, submission of specimen other than nasopharyngeal swab, presence of viral mutation(s) within the areas targeted by this assay, and inadequate number of viral copies(<138 copies/mL). A negative result must be combined with clinical observations, patient history, and epidemiological information. The expected result is Negative.  Fact Sheet for Patients:  EntrepreneurPulse.com.au  Fact Sheet for Healthcare Providers:  IncredibleEmployment.be  This test is no t yet approved or cleared by the [place] FDA and  has been authorized for detection  and/or diagnosis of SARS-CoV-2 by FDA under an Emergency Use Authorization (EUA). This EUA will remain  in effect (meaning this test can be used) for the duration of the COVID-19 declaration under Section 564(b)(1) of the Act, 21 U.S.C.section 360bbb-3(b)(1), unless the authorization is terminated  or revoked sooner.       Influenza A by PCR NEGATIVE NEGATIVE Final   Influenza B by PCR NEGATIVE NEGATIVE Final    Comment: (NOTE) The Xpert Xpress  SARS-CoV-2/FLU/RSV plus assay is intended as an aid in the diagnosis of influenza from Nasopharyngeal swab specimens and should not be used as a sole basis for treatment. Nasal washings and aspirates are unacceptable for Xpert Xpress SARS-CoV-2/FLU/RSV testing.  Fact Sheet for Patients: EntrepreneurPulse.com.au  Fact Sheet for Healthcare Providers: IncredibleEmployment.be  This test is not yet approved or cleared by the [place] FDA and has been authorized for detection and/or diagnosis of SARS-CoV-2 by FDA under an Emergency Use Authorization (EUA). This EUA will remain in effect (meaning this test can be used) for the duration of the COVID-19 declaration under Section 564(b)(1) of the Act, 21 U.S.C. section 360bbb-3(b)(1), unless the authorization is terminated or revoked.  Performed at [facility], [place] [address] [place], [place] [address]   Urine Culture     Status: Abnormal   Collection Time: 04/24/21  6:35 PM   Specimen: In/Out Cath Urine  Result Value Ref Range Status   Specimen Description   Final    IN/OUT CATH URINE Performed at [facility] [address], [place], [place] [address]    Special Requests   Final    NONE Performed at [facility], [place] [address] [place], [place] [address]    Culture >=100,000 COLONIES/mL ESCHERICHIA COLI (A)  Final   Report Status 04/27/2021 FINAL  Final   Organism ID, Bacteria  ESCHERICHIA COLI (A)  Final      Susceptibility   Escherichia coli - MIC*    AMPICILLIN <=2 SENSITIVE Sensitive     CEFAZOLIN <=4 SENSITIVE Sensitive     CEFEPIME <=0.12 SENSITIVE Sensitive     CEFTRIAXONE <=0.25 SENSITIVE Sensitive     CIPROFLOXACIN <=0.25 SENSITIVE Sensitive     GENTAMICIN <=1 SENSITIVE Sensitive     IMIPENEM <=0.25 SENSITIVE Sensitive     NITROFURANTOIN <=16 SENSITIVE Sensitive     TRIMETH/SULFA <=20 SENSITIVE Sensitive     AMPICILLIN/SULBACTAM <=2 SENSITIVE Sensitive     PIP/TAZO <=4 SENSITIVE Sensitive     * >=100,000 COLONIES/mL ESCHERICHIA COLI  Blood Culture (routine x 2)     Status: Abnormal   Collection Time: 04/24/21  8:31 PM   Specimen: BLOOD  Result Value Ref Range Status   Specimen Description   Final    BLOOD Performed at [facility] [place] [address] [place], [place] [address]    Special Requests   Final    BOTTLES DRAWN AEROBIC AND ANAEROBIC Blood Culture adequate volume Performed at [facility] [address], [place], [place] [address]    Culture  Setup Time   Final    GRAM NEGATIVE RODS IN BOTH AEROBIC AND ANAEROBIC BOTTLES CRITICAL VALUE NOTED.  VALUE IS CONSISTENT WITH PREVIOUSLY REPORTED AND CALLED VALUE.    Culture (A)  Final    ESCHERICHIA COLI SUSCEPTIBILITIES PERFORMED ON PREVIOUS CULTURE WITHIN THE LAST 5 DAYS. Performed at [facility], [place] [address], [place],  [address]    Report Status 04/27/2021 FINAL  Final  MRSA Next Gen by PCR, Nasal     Status: None   Collection Time: 04/25/21  4:50 PM   Specimen: Nasal Mucosa; Nasal Swab  Result Value Ref Range Status   MRSA by PCR Next Gen NOT DETECTED NOT DETECTED Final    Comment: (NOTE) The GeneXpert MRSA Assay (FDA approved for NASAL specimens only), is one component of a comprehensive MRSA colonization surveillance program. It is not intended to diagnose MRSA infection nor to  guide or monitor treatment for MRSA  infections. Test performance is not FDA approved in patients less than 58 years old. Performed at [facility], [place] [address] [place], [place] [address]   C Difficile Quick Screen w PCR reflex     Status: None   Collection Time: 04/26/21  4:58 AM   Specimen: STOOL  Result Value Ref Range Status   C Diff antigen NEGATIVE NEGATIVE Final   C Diff toxin NEGATIVE NEGATIVE Final   C Diff interpretation No C. difficile detected.  Final    Comment: Performed at [facility], [place] [address], [place], [place] [address]     Time coordinating discharge in minutes: 62  SIGNED:   Debbe Odea, MD  [facility] 05/02/2021, 11:32 AM

## 2021-05-11 ENCOUNTER — Ambulatory Visit: Payer: Medicare HMO | Admitting: Student

## 2021-05-12 ENCOUNTER — Ambulatory Visit: Payer: Medicare HMO | Admitting: Student

## 2021-05-12 ENCOUNTER — Encounter: Payer: Self-pay | Admitting: Student

## 2021-05-12 ENCOUNTER — Other Ambulatory Visit: Payer: Self-pay

## 2021-05-12 VITALS — BP 113/59 | HR 68 | Temp 97.8°F | Ht 64.0 in | Wt 188.0 lb

## 2021-05-12 DIAGNOSIS — I5032 Chronic diastolic (congestive) heart failure: Secondary | ICD-10-CM

## 2021-05-12 DIAGNOSIS — I10 Essential (primary) hypertension: Secondary | ICD-10-CM

## 2021-05-12 DIAGNOSIS — I480 Paroxysmal atrial fibrillation: Secondary | ICD-10-CM

## 2021-05-12 DIAGNOSIS — E782 Mixed hyperlipidemia: Secondary | ICD-10-CM

## 2021-05-12 MED ORDER — PRAVASTATIN SODIUM 20 MG PO TABS: 20.0000 mg | ORAL_TABLET | Freq: Every day | ORAL | 3 refills | Status: DC

## 2021-05-12 MED ORDER — HYDRALAZINE HCL 50 MG PO TABS: 50.0000 mg | ORAL_TABLET | Freq: Three times a day (TID) | ORAL | 3 refills | Status: DC

## 2021-05-12 NOTE — Progress Notes (Signed)
Primary Physician/Referring:  Trey Sailors, PA  Patient ID: [name], female    DOB: 1941-01-09, 81 y.o.   MRN: [id number]  Chief Complaint  Patient presents with   Hypertension   Atrial Fibrillation   HPI:    [name]  is a 81 y.o. 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  Admitted 08/2020 with influenza and found to have new onset atrial fibrillation.  Patient was discharged with Eliquis and diltiazem.  She presented to our office 10/16/2020 for follow-up and EKG revealed slow atypical atrial flutter which persisted through [facility] cardiac monitor.  Patient struggled with medication compliance, however when this improved and she was anticoagulated and recommended cardioversion.  Patient underwent successful direct-current cardioversion 01/14/2021.  She then presented to our office the following day with dyspnea and worsening swelling therefore advised her to increase Lasix for approximately 3 days.  However patient then presented to emergency department 01/16/2021.  At that time BNP elevated at 596 and troponin mildly elevated but flat, consistent with acute on chronic heart failure.  While in the emergency department patient diuresed approximately 1 L and was advised to continue outpatient regimen as directed at her last office visit here.  Patient was again admitted 04/25/2021 - 05/02/2021 with sepsis secondary to UTI and bacteremia requiring ICU stay.  Patient had AKI and was hypotensive during hospitalization, therefore antihypertensive medications with the exception of amlodipine were discontinued at discharge.  Patient presents for hospital follow-up.  Since discharge she has resumed all her prior cardiac medications with the exception of Eliquis, which has inadvertently been  discontinued at some point since last office visit.  Past Medical History:  Diagnosis Date   Diabetes mellitus without complication ([place])    Hypertension    Past Surgical History:  Procedure Laterality Date   CARDIOVERSION N/A 01/14/2021   Procedure: CARDIOVERSION;  Surgeon: Adrian Prows, MD;  Location: [place];  Service: Cardiovascular;  Laterality: N/A;   TONSILLECTOMY     History reviewed. No pertinent family history.  No history of premature coronary artery disease or sudden cardiac death in her family. Social History   Tobacco Use   Smoking status: Never   Smokeless tobacco: Never  Substance Use Topics   Alcohol use: No   Marital Status: Divorced  ROS  Review of Systems  Constitutional: Positive for malaise/fatigue.  Cardiovascular:  Positive for leg swelling. Negative for chest pain, claudication, near-syncope, orthopnea, palpitations, paroxysmal nocturnal dyspnea and syncope.  Respiratory:  Positive for shortness of breath.   Neurological:  Negative for dizziness.  Objective  Blood pressure (!) 113/59, pulse 68, temperature 97.8 F (36.6 C), temperature source Temporal, height '5\' 4"'  (1.626 m), weight 188 lb (85.3 kg), SpO2 98 %.  Vitals with BMI 05/12/2021 05/02/2021 05/01/2021  Height '5\' 4"'  - -  Weight 188 lbs 182 lbs 12 oz -  BMI 44.03 47.42 -  Systolic 595 638 756  Diastolic 59 63 82  Pulse 68 79 72     Physical Exam Vitals reviewed.  Constitutional:      Appearance: She is obese.     Comments: Mildly obese in no acute distress.  Neck:     Vascular: No carotid bruit or JVD.  Cardiovascular:     Rate and Rhythm: Normal rate and regular rhythm.     Pulses:  Carotid pulses are 2+ on the right side and 2+ on the left side.      Femoral pulses are 2+ on the right side and 2+ on the left side.      Dorsalis pedis pulses are 2+ on the right side and 1+ on the left side.       Posterior tibial pulses are 2+ on the right side and 0 on the left side.      Heart sounds: Normal heart sounds. No murmur heard.   No gallop.     Comments:   Pulmonary:     Effort: Pulmonary effort is normal.     Breath sounds: No wheezing or rales.  Musculoskeletal:     Right lower leg: Edema (2+ pitting) present.     Left lower leg: Edema (2+ pitting) present.  Skin:    Comments: Hair loss and atrophic changes bilateral lower legs.  Neurological:     Mental Status: She is alert.   Laboratory examination:   Recent Labs    04/30/21 0514 05/01/21 0544 05/02/21 0838  NA 137 135 144  K 3.6 3.7 3.9  CL 106 106 109  CO2 20* 21* 23  GLUCOSE 128* 139* 100*  BUN 57* 55* 47*  CREATININE 3.02* 2.78* 2.49*  CALCIUM 8.3* 7.9* 8.4*  GFRNONAA 15* 17* 19*   estimated creatinine clearance is 19.3 mL/min (A) (by C-G formula based on SCr of 2.49 mg/dL (H)).  CMP Latest Ref Rng & Units 05/02/2021 05/01/2021 04/30/2021  Glucose 70 - 99 mg/dL 100(H) 139(H) 128(H)  BUN 8 - 23 mg/dL 47(H) 55(H) 57(H)  Creatinine 0.44 - 1.00 mg/dL 2.49(H) 2.78(H) 3.02(H)  Sodium 135 - 145 mmol/L 144 135 137  Potassium 3.5 - 5.1 mmol/L 3.9 3.7 3.6  Chloride 98 - 111 mmol/L 109 106 106  CO2 22 - 32 mmol/L 23 21(L) 20(L)  Calcium 8.9 - 10.3 mg/dL 8.4(L) 7.9(L) 8.3(L)  Total Protein 6.5 - 8.1 g/dL - - -  Total Bilirubin 0.3 - 1.2 mg/dL - - -  Alkaline Phos 38 - 126 U/L - - -  AST 15 - 41 U/L - - -  ALT 0 - 44 U/L - - -   CBC Latest Ref Rng & Units 05/02/2021 05/01/2021 04/30/2021  WBC 4.0 - 10.5 K/uL 15.9(H) 17.0(H) 19.3(H)  Hemoglobin 12.0 - 15.0 g/dL 8.3(L) 8.2(L) 8.3(L)  Hematocrit 36.0 - 46.0 % 26.3(L) 25.2(L) 25.8(L)  Platelets 150 - 400 K/uL 197 152 108(L)    Lipid Panel No results for input(s): CHOL, TRIG, LDLCALC, VLDL, HDL, CHOLHDL, LDLDIRECT in the last 8760 hours.  HEMOGLOBIN A1C Lab Results  Component Value Date   HGBA1C 9.6 (H) 04/25/2021   MPG 228.82 04/25/2021   TSH Recent Labs    09/07/20 1255  TSH 1.023    BNP    Component Value Date/Time   BNP  640.9 (H) 02/03/2021 1448   BNP 596.2 (H) 01/16/2021 1755    ProBNP No results found for: PROBNP  External labs:  01/16/2021: BNP 596 High-sensitivity troponin 38--> 36  05/15/2020: Serum glucose 184 mg, BUN 26, creatinine 1.60, EGFR 35 mL, sodium 137, potassium 4.3, CMP otherwise normal. A1c 10.0%. BNP 08/10/1975. Total cholesterol 224, triglycerides 46, HDL 113, LDL 103.  Non-HDL cholesterol 111. Labs 10/20/2016: TSH normal.  Allergies   Allergies  Allergen Reactions   Peanut-Containing Drug Products     Stomach pain   Amlodipine Swelling    Medications Prior to Visit:   Outpatient Medications Prior  to Visit  Medication Sig Dispense Refill   Accu-Chek FastClix Lancets MISC See admin instructions.     amLODipine (NORVASC) 10 MG tablet Take 10 mg by mouth daily.     BD PEN NEEDLE NANO 2ND GEN 32G X 4 MM MISC 3 (three) times daily. as directed     Blood Glucose Monitoring Suppl (ACCU-CHEK GUIDE ME) w/Device KIT See admin instructions.     diltiazem (CARDIZEM CD) 120 MG 24 hr capsule Take 120 mg by mouth daily.     furosemide (LASIX) 20 MG tablet Take 20 mg by mouth.     glucose blood test strip TEST BLOOD SUGARS 3 4 TIMES DAILY.     HUMULIN 70/30 KWIKPEN (70-30) 100 UNIT/ML KwikPen Inject 15 Units into the skin at bedtime.     isosorbide dinitrate (ISORDIL) 30 MG tablet Take 30 mg by mouth 3 (three) times daily.     Menthol-Methyl Salicylate (MUSCLE RUB EX) Apply 1 application topically daily as needed (leg cramps).     olmesartan (BENICAR) 20 MG tablet Take 20 mg by mouth daily.     orphenadrine (NORFLEX) 100 MG tablet Take 100 mg by mouth 2 (two) times daily as needed.     hydrochlorothiazide (HYDRODIURIL) 50 MG tablet Take 50 mg by mouth 3 (three) times daily.     pravastatin (PRAVACHOL) 20 MG tablet Take 1 tablet (20 mg total) by mouth daily. (Patient taking differently: Take 20 mg by mouth at bedtime.) 90 tablet 1   No facility-administered medications prior to visit.    Final Medications at End of Visit    Current Meds  Medication Sig   Accu-Chek FastClix Lancets MISC See admin instructions.   amLODipine (NORVASC) 10 MG tablet Take 10 mg by mouth daily.   BD PEN NEEDLE NANO 2ND GEN 32G X 4 MM MISC 3 (three) times daily. as directed   Blood Glucose Monitoring Suppl (ACCU-CHEK GUIDE ME) w/Device KIT See admin instructions.   diltiazem (CARDIZEM CD) 120 MG 24 hr capsule Take 120 mg by mouth daily.   furosemide (LASIX) 20 MG tablet Take 20 mg by mouth.   glucose blood test strip TEST BLOOD SUGARS 3 4 TIMES DAILY.   HUMULIN 70/30 KWIKPEN (70-30) 100 UNIT/ML KwikPen Inject 15 Units into the skin at bedtime.   hydrALAZINE (APRESOLINE) 50 MG tablet Take 1 tablet (50 mg total) by mouth 3 (three) times daily.   isosorbide dinitrate (ISORDIL) 30 MG tablet Take 30 mg by mouth 3 (three) times daily.   Menthol-Methyl Salicylate (MUSCLE RUB EX) Apply 1 application topically daily as needed (leg cramps).   olmesartan (BENICAR) 20 MG tablet Take 20 mg by mouth daily.   orphenadrine (NORFLEX) 100 MG tablet Take 100 mg by mouth 2 (two) times daily as needed.   [DISCONTINUED] hydrochlorothiazide (HYDRODIURIL) 50 MG tablet Take 50 mg by mouth 3 (three) times daily.   [DISCONTINUED] pravastatin (PRAVACHOL) 20 MG tablet Take 1 tablet (20 mg total) by mouth daily. (Patient taking differently: Take 20 mg by mouth at bedtime.)   Radiology:   Chest x-ray single view 04/25/2019: Mediastinum hilar structures normal. Cardiomegaly. Mild pulmonary venous congestion bilateral interstitial prominence. Findings suggest mild CHF.  Calcified pulmonary nodules noted consistent with old calcified granulomas. Small right pleural effusion cannot be Excluded.  Cardiac Studies:  Echocardiogram 12/26/2020:  Normal LV systolic function with visual EF 60-65%. Left ventricle cavity  is normal in size. Mild left ventricular hypertrophy. D-shaped septum in  systole and diastole suggestive of RV  pressure and volume overload.    Normal global wall motion. Doppler evidence of grade III (restrictive)  diastolic dysfunction, elevated LAP.  Left atrial cavity is mildly dilated. Atrial septum bows from right to  left, suggestive of elevated RAP.  Right atrial cavity is visually dilated.  Right ventricle cavity is visually dilated and normal function.  Mild (Grade I) aortic regurgitation.  Mild mitral valve annulus calcification. Moderate (Grade III) mitral  regurgitation.  Severe tricuspid regurgitation. Moderate pulmonary hypertension. RVSP  measures 51 mmHg (may be unappreciated given RAE and dilated IVC leading  to equalization of pressures).  Moderate pulmonic regurgitation.  IVC is dilated with a respiratory response of <50%.  Compared to study 06/19/2020 G2DD is now G3DD,TR and PHTN remains stable  otherwise no significant change.  ABI 06/19/2020:  This exam reveals normal perfusion of the right and left lower extremity  (ABI 1.00). Mildly abnormal biphasic waveform noted at the ankles.  There  is mild plaque evident in the lower extremity vessels at the ankles.   PCV MYOCARDIAL PERFUSION WITH LEXISCAN 06/11/2020 Nondiagnostic ECG stress. Perfusion imaging study demonstrates decreased uptake in the inferior wall suggestive of soft tissue attenuation.  Ischemia in this region cannot be completely excluded. Overall LV systolic function is normal without regional wall motion abnormalities. Stress LV EF: 82%. Low risk.  Ambulatory cardiac telemetry 10/16/20-6/30-22: Patient in atypical atrial flutter 100% of the time. Heart rate 53-130 bpm with average 87 bpm. No symptoms reported. Rare PVCs. Dr. Einar Gip assisted in interpretation of findings.  EKG:  02/02/2021: Sinus rhythm with first-degree AV block at a rate of 96 bpm.  Right bundle branch block.  12/30/2020: Atypical atrial flutter at a rate of 104 bpm.  Left axis.  right bundle branch block.  10/16/2020: Slow atypical atrial  flutter at a rate of 85 bpm.  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  09/07/2020: Atrial fibrillation, 104 bpm, right bundle branch block, left anterior fascicular block, poor R wave progression, without underlying injury pattern.  EKG 03/19/2020: Sinus rhythm with first-degree AV block at rate of 80 bpm, left axis deviation, left anterior fascicular block.  Right bundle branch block.  Trifascicular block.  Assessment     ICD-10-CM   1. Chronic diastolic heart failure (HCC)  R44.31 Basic metabolic panel    Brain natriuretic peptide    2. Mixed hyperlipidemia  E78.2 pravastatin (PRAVACHOL) 20 MG tablet    3. Paroxysmal atrial fibrillation (HCC)  I48.0     4. Essential hypertension  I10        Medications Discontinued During This Encounter  Medication Reason   pravastatin (PRAVACHOL) 20 MG tablet Reorder   hydrochlorothiazide (HYDRODIURIL) 50 MG tablet Entry Error     Meds ordered this encounter  Medications   pravastatin (PRAVACHOL) 20 MG tablet    Sig: Take 1 tablet (20 mg total) by mouth daily.    Dispense:  90 tablet    Refill:  3   hydrALAZINE (APRESOLINE) 50 MG tablet    Sig: Take 1 tablet (50 mg total) by mouth 3 (three) times daily.    Dispense:  270 tablet    Refill:  3    This patients CHA2DS2-VASc Score 5 (HTN, DM, age, F) and yearly risk of stroke 7.2%.   Recommendations:   Milia Warth is a 81 y.o. 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  Admitted 08/2020 with influenza and found to have new onset atrial fibrillation.  Patient was discharged with Eliquis and diltiazem.  She presented to our office 10/16/2020 for follow-up and EKG revealed slow atypical atrial flutter which persisted through [facility] cardiac  monitor.  Patient struggled with medication compliance, however when this improved and she was anticoagulated recommended cardioversion.  Patient underwent successful direct-current cardioversion 01/14/2021.  She presented to our office 01/15/2021 and again to the emergency department 01/16/2021 with worsening dyspnea and swelling.  During her office visit on 9/15 patient had been advised to increase outpatient Lasix.   Patient was again admitted 04/25/2021 - 05/02/2021 with sepsis secondary to UTI and bacteremia requiring ICU stay.  Patient had AKI and was hypotensive during hospitalization, therefore antihypertensive medications with the exception of amlodipine were discontinued at discharge.  Patient presents for hospital follow-up.  Patient is tolerating resuming antihypertensive medications without issue.  Blood pressure is well controlled.  However given AKI advised patient to hold olmesartan until he can review repeat labs.  Patient reports she was seen by PCP earlier this week who did lab work-up.  Patient also needs referral to nephrology, although it is unclear whether PCP has done this or if our office should do so.  I have therefore requested PCP records be sent to our office as soon as possible.  Patient may continue Lasix as she appears volume overloaded on exam today.  I have refilled her pravastatin and hydralazine as requested.  In regard to atrial fibrillation, patient's CHA2DS2-VASc score is 5, however somehow Eliquis has been inadvertently discontinued.  To my knowledge there is no contraindication for anticoagulation, will therefore verify with patient's PCP.  Discussed with patient recommendation of resuming anticoagulation, however will hold off until unable to contact PCPs office.  Tentative plan will be to resume Eliquis.  Will obtain BMP and BNP.  Follow-up in 2 weeks, sooner if needed, for heart failure and atrial fibrillation management.   Alethia Berthold, PA-C 05/13/2021,  4:26 PM Office: [phone]

## 2021-05-13 LAB — BASIC METABOLIC PANEL
BUN/Creatinine Ratio: 7 — ABNORMAL LOW (ref 12–28)
BUN: 21 mg/dL (ref 8–27)
CO2: 17 mmol/L — ABNORMAL LOW (ref 20–29)
Calcium: 9 mg/dL (ref 8.7–10.3)
Chloride: 103 mmol/L (ref 96–106)
Creatinine, Ser: 3.2 mg/dL — ABNORMAL HIGH (ref 0.57–1.00)
Glucose: 75 mg/dL (ref 70–99)
Potassium: 4.8 mmol/L (ref 3.5–5.2)
Sodium: 137 mmol/L (ref 134–144)
eGFR: 14 mL/min/{1.73_m2} — ABNORMAL LOW (ref >59)

## 2021-05-13 LAB — BRAIN NATRIURETIC PEPTIDE

## 2021-05-14 ENCOUNTER — Other Ambulatory Visit: Payer: Self-pay

## 2021-05-14 ENCOUNTER — Other Ambulatory Visit: Payer: Self-pay | Admitting: General Practice

## 2021-05-14 DIAGNOSIS — I5032 Chronic diastolic (congestive) heart failure: Secondary | ICD-10-CM

## 2021-05-15 NOTE — Progress Notes (Signed)
Did we figure out what happened to her BNP? Please call patient and verify that she has stopped olmesartan

## 2021-05-15 NOTE — Progress Notes (Signed)
I re-ordered the BNP. They do not know what happened. Pt stated she will go back to get it checked. And she stated that she did stop the olmesartan.

## 2021-05-16 LAB — PRO B NATRIURETIC PEPTIDE: NT-Pro BNP: 6865 pg/mL — ABNORMAL HIGH (ref 0–738)

## 2021-05-16 LAB — SPECIMEN STATUS REPORT

## 2021-05-18 ENCOUNTER — Other Ambulatory Visit: Payer: Self-pay

## 2021-05-18 DIAGNOSIS — N1832 Chronic kidney disease, stage 3b: Secondary | ICD-10-CM

## 2021-05-18 DIAGNOSIS — Z794 Long term (current) use of insulin: Secondary | ICD-10-CM

## 2021-05-18 DIAGNOSIS — I10 Essential (primary) hypertension: Secondary | ICD-10-CM

## 2021-05-18 DIAGNOSIS — E1122 Type 2 diabetes mellitus with diabetic chronic kidney disease: Secondary | ICD-10-CM

## 2021-05-18 DIAGNOSIS — I5032 Chronic diastolic (congestive) heart failure: Secondary | ICD-10-CM

## 2021-05-18 NOTE — Progress Notes (Signed)
Attempted to call pt, no answer. Left vm requesting call back. Labs have been ordered.

## 2021-05-18 NOTE — Progress Notes (Signed)
Did you advise her to stop the olmesartan, if not please do so. Also BNP is high, please have her take Lasix 40 mg BID for 5 days, then back to regular dosing.   Also please put in for repeat NT pro BNP and BMP to be done prior to next office visit.

## 2021-05-19 ENCOUNTER — Other Ambulatory Visit: Payer: Self-pay | Admitting: Student

## 2021-05-19 DIAGNOSIS — N1832 Chronic kidney disease, stage 3b: Secondary | ICD-10-CM

## 2021-05-19 DIAGNOSIS — N179 Acute kidney failure, unspecified: Secondary | ICD-10-CM

## 2021-05-19 DIAGNOSIS — I5032 Chronic diastolic (congestive) heart failure: Secondary | ICD-10-CM

## 2021-05-19 DIAGNOSIS — Z794 Long term (current) use of insulin: Secondary | ICD-10-CM

## 2021-05-19 DIAGNOSIS — I10 Essential (primary) hypertension: Secondary | ICD-10-CM

## 2021-05-19 NOTE — Progress Notes (Signed)
Second attempt to contact pt, no answer. Left vm requesting call back.

## 2021-05-19 NOTE — Progress Notes (Signed)
Given continued worsening renal function following AKI during recent admission recommend referral to nephrology. Have advised her to stop olmesartan but BNP is high therefore advised her to continue Lasix. Will repeat labs in 2 weeks.     ICD-10-CM   1. AKI (acute kidney injury) ([place])  N17.9     2. Chronic diastolic heart failure (HCC)  I50.32     3. Essential hypertension  I10     4. Type 2 diabetes mellitus with stage 3b chronic kidney disease, with long-term current use of insulin [facility])  E11.22    N18.32    Z79.4       Terry Berthold, PA-C 05/19/2021, 2:57 PM Office: [phone]

## 2021-05-20 NOTE — Progress Notes (Signed)
Third attempt to contact pt, no answer. Left vm requesting call back. No attempt has been made to contact us at this time.

## 2021-05-21 ENCOUNTER — Encounter (HOSPITAL_COMMUNITY): Payer: Self-pay

## 2021-05-21 ENCOUNTER — Inpatient Hospital Stay (HOSPITAL_COMMUNITY)
Admission: EM | Admit: 2021-05-21 | Discharge: 2021-05-28 | DRG: 682 | Disposition: A | Discharge status: Home Health | Payer: Medicare HMO | Attending: Internal Medicine | Admitting: Internal Medicine

## 2021-05-21 DIAGNOSIS — N390 Urinary tract infection, site not specified: Secondary | ICD-10-CM | POA: Diagnosis present

## 2021-05-21 DIAGNOSIS — Z888 Allergy status to other drugs, medicaments and biological substances status: Secondary | ICD-10-CM | POA: Diagnosis not present

## 2021-05-21 DIAGNOSIS — Z9102 Food additives allergy status: Secondary | ICD-10-CM | POA: Diagnosis not present

## 2021-05-21 DIAGNOSIS — E872 Acidosis, unspecified: Secondary | ICD-10-CM | POA: Diagnosis present

## 2021-05-21 DIAGNOSIS — I4891 Unspecified atrial fibrillation: Secondary | ICD-10-CM | POA: Diagnosis present

## 2021-05-21 DIAGNOSIS — B965 Pseudomonas (aeruginosa) (mallei) (pseudomallei) as the cause of diseases classified elsewhere: Secondary | ICD-10-CM | POA: Diagnosis present

## 2021-05-21 DIAGNOSIS — Z20822 Contact with and (suspected) exposure to covid-19: Secondary | ICD-10-CM | POA: Diagnosis present

## 2021-05-21 DIAGNOSIS — I130 Hypertensive heart and chronic kidney disease with heart failure and stage 1 through stage 4 chronic kidney disease, or unspecified chronic kidney disease: Secondary | ICD-10-CM | POA: Diagnosis present

## 2021-05-21 DIAGNOSIS — E1122 Type 2 diabetes mellitus with diabetic chronic kidney disease: Secondary | ICD-10-CM | POA: Diagnosis present

## 2021-05-21 DIAGNOSIS — B952 Enterococcus as the cause of diseases classified elsewhere: Secondary | ICD-10-CM | POA: Diagnosis present

## 2021-05-21 DIAGNOSIS — Z79899 Other long term (current) drug therapy: Secondary | ICD-10-CM | POA: Diagnosis not present

## 2021-05-21 DIAGNOSIS — T39395A Adverse effect of other nonsteroidal anti-inflammatory drugs [NSAID], initial encounter: Secondary | ICD-10-CM | POA: Diagnosis present

## 2021-05-21 DIAGNOSIS — N170 Acute kidney failure with tubular necrosis: Principal | ICD-10-CM | POA: Diagnosis present

## 2021-05-21 DIAGNOSIS — E1129 Type 2 diabetes mellitus with other diabetic kidney complication: Secondary | ICD-10-CM | POA: Diagnosis present

## 2021-05-21 DIAGNOSIS — N1832 Chronic kidney disease, stage 3b: Secondary | ICD-10-CM | POA: Diagnosis present

## 2021-05-21 DIAGNOSIS — I10 Essential (primary) hypertension: Secondary | ICD-10-CM | POA: Diagnosis present

## 2021-05-21 DIAGNOSIS — I5033 Acute on chronic diastolic (congestive) heart failure: Secondary | ICD-10-CM | POA: Diagnosis present

## 2021-05-21 DIAGNOSIS — D631 Anemia in chronic kidney disease: Secondary | ICD-10-CM | POA: Diagnosis present

## 2021-05-21 DIAGNOSIS — K5900 Constipation, unspecified: Secondary | ICD-10-CM | POA: Diagnosis not present

## 2021-05-21 DIAGNOSIS — N179 Acute kidney failure, unspecified: Secondary | ICD-10-CM

## 2021-05-21 DIAGNOSIS — Z794 Long term (current) use of insulin: Secondary | ICD-10-CM | POA: Diagnosis not present

## 2021-05-21 DIAGNOSIS — E875 Hyperkalemia: Secondary | ICD-10-CM | POA: Diagnosis present

## 2021-05-21 DIAGNOSIS — R32 Unspecified urinary incontinence: Secondary | ICD-10-CM | POA: Diagnosis present

## 2021-05-21 DIAGNOSIS — N185 Chronic kidney disease, stage 5: Secondary | ICD-10-CM | POA: Diagnosis present

## 2021-05-21 DIAGNOSIS — R188 Other ascites: Secondary | ICD-10-CM | POA: Diagnosis present

## 2021-05-21 DIAGNOSIS — I959 Hypotension, unspecified: Secondary | ICD-10-CM | POA: Diagnosis present

## 2021-05-21 DIAGNOSIS — R0602 Shortness of breath: Secondary | ICD-10-CM

## 2021-05-21 LAB — BASIC METABOLIC PANEL
BUN/Creatinine Ratio: 6 — ABNORMAL LOW (ref 12–28)
BUN: 37 mg/dL — ABNORMAL HIGH (ref 8–27)
CO2: 17 mmol/L — ABNORMAL LOW (ref 20–29)
Calcium: 8.7 mg/dL (ref 8.7–10.3)
Chloride: 107 mmol/L — ABNORMAL HIGH (ref 96–106)
Creatinine, Ser: 6.08 mg/dL — ABNORMAL HIGH (ref 0.57–1.00)
Glucose: 64 mg/dL — ABNORMAL LOW (ref 70–99)
Potassium: 5 mmol/L (ref 3.5–5.2)
Sodium: 140 mmol/L (ref 134–144)
eGFR: 7 mL/min/{1.73_m2} — ABNORMAL LOW (ref >59)

## 2021-05-21 LAB — COMPREHENSIVE METABOLIC PANEL
ALT: 11 U/L (ref 0–44)
AST: 19 U/L (ref 15–41)
Albumin: 3.6 g/dL (ref 3.5–5.0)
Alkaline Phosphatase: 81 U/L (ref 38–126)
Anion gap: 23 — ABNORMAL HIGH (ref 5–15)
BUN: 50 mg/dL — ABNORMAL HIGH (ref 8–23)
CO2: 17 mmol/L — ABNORMAL LOW (ref 22–32)
Calcium: 8.7 mg/dL — ABNORMAL LOW (ref 8.9–10.3)
Chloride: 98 mmol/L (ref 98–111)
Creatinine, Ser: 6.77 mg/dL — ABNORMAL HIGH (ref 0.44–1.00)
GFR, Estimated: 6 mL/min — ABNORMAL LOW (ref >60)
Glucose, Bld: 108 mg/dL — ABNORMAL HIGH (ref 70–99)
Potassium: 5 mmol/L (ref 3.5–5.1)
Sodium: 138 mmol/L (ref 135–145)
Total Bilirubin: 0.7 mg/dL (ref 0.3–1.2)
Total Protein: 7.5 g/dL (ref 6.5–8.1)

## 2021-05-21 LAB — CBC WITH DIFFERENTIAL/PLATELET
Abs Immature Granulocytes: 0.02 10*3/uL (ref 0.00–0.07)
Basophils Absolute: 0.1 10*3/uL (ref 0.0–0.1)
Basophils Relative: 1 %
Eosinophils Absolute: 0.1 10*3/uL (ref 0.0–0.5)
Eosinophils Relative: 2 %
HCT: 29 % — ABNORMAL LOW (ref 36.0–46.0)
Hemoglobin: 9 g/dL — ABNORMAL LOW (ref 12.0–15.0)
Immature Granulocytes: 0 %
Lymphocytes Relative: 20 %
Lymphs Abs: 1 10*3/uL (ref 0.7–4.0)
MCH: 29.5 pg (ref 26.0–34.0)
MCHC: 31 g/dL (ref 30.0–36.0)
MCV: 95.1 fL (ref 80.0–100.0)
Monocytes Absolute: 0.6 10*3/uL (ref 0.1–1.0)
Monocytes Relative: 12 %
Neutro Abs: 3.2 10*3/uL (ref 1.7–7.7)
Neutrophils Relative %: 65 %
Platelets: 170 10*3/uL (ref 150–400)
RBC: 3.05 MIL/uL — ABNORMAL LOW (ref 3.87–5.11)
RDW: 17.4 % — ABNORMAL HIGH (ref 11.5–15.5)
WBC: 5 10*3/uL (ref 4.0–10.5)
nRBC: 0 % (ref 0.0–0.2)

## 2021-05-21 LAB — RESP PANEL BY RT-PCR (FLU A&B, COVID) ARPGX2
Influenza A by PCR: NEGATIVE
Influenza B by PCR: NEGATIVE
SARS Coronavirus 2 by RT PCR: NEGATIVE

## 2021-05-21 LAB — PRO B NATRIURETIC PEPTIDE: NT-Pro BNP: 10030 pg/mL — ABNORMAL HIGH (ref 0–738)

## 2021-05-21 LAB — MAGNESIUM: Magnesium: 2.9 mg/dL — ABNORMAL HIGH (ref 1.7–2.4)

## 2021-05-21 LAB — PHOSPHORUS: Phosphorus: 5.5 mg/dL — ABNORMAL HIGH (ref 2.5–4.6)

## 2021-05-21 NOTE — ED Provider Triage Note (Signed)
Emergency Medicine Provider Triage Evaluation Note  [name] , a 81 y.o. female  was evaluated in triage.  Pt complains of abnormal lab. Sent here by PCP. Creat was high. Patient endorses recent fatigue, generalized weakness, bilateral leg swelling, shortness of breath.   Review of Systems  Positive:  Negative:   Physical Exam  BP 126/63 (BP Location: Left Arm)    Pulse 88    Temp 97.7 F (36.5 C) (Oral)    Resp 18    Ht 5\' 4"  (1.626 m)    Wt 82.6 kg    SpO2 97%    BMI 31.24 kg/m  Gen:   Awake, no distress   Resp:  Normal effort  MSK:   Moves extremities without difficulty  Other:    Medical Decision Making  Medically screening exam initiated at 8:24 PM.  Appropriate orders placed.  [name] was informed that the remainder of the evaluation will be completed by another provider, this initial triage assessment does not replace that evaluation, and the importance of remaining in the ED until their evaluation is complete.     Adolphus Birchwood, [place] 05/21/21 2026

## 2021-05-21 NOTE — ED Triage Notes (Signed)
Pt reports she had blood work done at her PCP office and was told to come to the ER because "my kidneys arent working properly." She reports oliguria since yesterday. Denies any pain.

## 2021-05-21 NOTE — ED Provider Notes (Addendum)
[facility] DEPT Provider Note   CSN: [id number] Arrival date & time: 05/21/21  1855     History  Chief Complaint  Patient presents with   Abnormal Lab    [name] is a 81 y.o. female.  81 year old female who presents due to worsening creatinine.  Patient has a history of chronic kidney disease and had blood work done yesterday which showed her creatinine to have doubled her BUN to also have increased to.  Patient also feels otherwise at her baseline with exception of being slightly short of breath.  Denies any cough or congestion.      Home Medications Prior to Admission medications   Medication Sig Start Date End Date Taking? Authorizing Provider  Accu-Chek FastClix Lancets MISC See admin instructions. 01/16/19   [provider]  amLODipine (NORVASC) 10 MG tablet Take 10 mg by mouth daily. 04/08/21   [provider]  BD PEN NEEDLE NANO 2ND GEN 32G X 4 MM MISC 3 (three) times daily. as directed 08/13/19   [provider]  Blood Glucose Monitoring Suppl (ACCU-CHEK GUIDE ME) w/Device KIT See admin instructions. 12/11/18   [provider]  diltiazem (CARDIZEM CD) 120 MG 24 hr capsule Take 120 mg by mouth daily. 02/27/21   [provider]  furosemide (LASIX) 20 MG tablet Take 20 mg by mouth.    [provider]  glucose blood test strip TEST BLOOD SUGARS 3 4 TIMES DAILY. 10/27/17   [provider]  HUMULIN 70/30 KWIKPEN (70-30) 100 UNIT/ML KwikPen Inject 15 Units into the skin at bedtime. 04/11/21   [provider]  hydrALAZINE (APRESOLINE) 50 MG tablet Take 1 tablet (50 mg total) by mouth 3 (three) times daily. 05/12/21 08/10/21  Cantwell, Celeste C, PA-C  isosorbide dinitrate (ISORDIL) 30 MG tablet Take 30 mg by mouth 3 (three) times daily.    [provider]  Menthol-Methyl Salicylate (MUSCLE RUB EX) Apply 1 application topically daily as needed (leg cramps).    [provider]  olmesartan (BENICAR) 20 MG tablet Take 20 mg by mouth daily.    [provider]  orphenadrine (NORFLEX) 100 MG tablet Take 100 mg by mouth 2 (two) times daily as needed. 04/23/21   [provider]  pravastatin (PRAVACHOL) 20 MG tablet Take 1 tablet (20 mg total) by mouth daily. 05/12/21   Cantwell, Celeste C, PA-C      Allergies    Peanut-containing drug products and Amlodipine    Review of Systems   Review of Systems  All other systems reviewed and are negative.  Physical Exam Updated Vital Signs BP 124/61    Pulse (!) 58    Temp 97.7 F (36.5 C) (Oral)    Resp 13    Ht 1.626 m (_0 )    Wt 82.6 kg    SpO2 100%    BMI 31.24 kg/m  Physical Exam Vitals and nursing note reviewed.  Constitutional:      General: She is not in acute distress.    Appearance: Normal appearance. She is well-developed. She is not toxic-appearing.  HENT:     Head: Normocephalic and atraumatic.  Eyes:     General: Lids are normal.     Conjunctiva/sclera: Conjunctivae normal.     Pupils: Pupils are equal, round, and reactive to light.  Neck:     Thyroid: No thyroid mass.     Trachea: No tracheal deviation.  Cardiovascular:     Rate and Rhythm: Normal  rate and regular rhythm.     Heart sounds: Normal heart sounds. No murmur heard.   No gallop.  Pulmonary:     Effort: Pulmonary effort is normal. No respiratory distress.     Breath sounds: Normal breath sounds. No stridor. No decreased breath sounds, wheezing, rhonchi or rales.  Abdominal:     General: There is no distension.     Palpations: Abdomen is soft.     Tenderness: There is no abdominal tenderness. There is no rebound.  Musculoskeletal:        General: No tenderness. Normal range of motion.     Cervical back: Normal range of motion and neck supple.     Comments: 3+ bilateral lower extremity pitting edema  Skin:    General: Skin is warm and dry.     Findings: No abrasion or rash.  Neurological:     Mental  Status: She is alert and oriented to person, place, and time. Mental status is at baseline.     GCS: GCS eye subscore is 4. GCS verbal subscore is 5. GCS motor subscore is 6.     Cranial Nerves: No cranial nerve deficit.     Sensory: No sensory deficit.     Motor: Motor function is intact.  Psychiatric:        Attention and Perception: Attention normal.        Speech: Speech normal.        Behavior: Behavior normal.    ED Results / Procedures / Treatments   Labs (all labs ordered are listed, but only abnormal results are displayed) Labs Reviewed  COMPREHENSIVE METABOLIC PANEL - Abnormal; Notable for the following components:      Result Value   CO2 17 (*)    Glucose, Bld 108 (*)    BUN 50 (*)    Creatinine, Ser 6.77 (*)    Calcium 8.7 (*)    GFR, Estimated 6 (*)    Anion gap 23 (*)    All other components within normal limits  CBC WITH DIFFERENTIAL/PLATELET - Abnormal; Notable for the following components:   RBC 3.05 (*)    Hemoglobin 9.0 (*)    HCT 29.0 (*)    RDW 17.4 (*)    All other components within normal limits  MAGNESIUM - Abnormal; Notable for the following components:   Magnesium 2.9 (*)    All other components within normal limits  PHOSPHORUS - Abnormal; Notable for the following components:   Phosphorus 5.5 (*)    All other components within normal limits    EKG None  Radiology No results found.  Procedures Procedures    Medications Ordered in ED Medications - No data to display  ED Course/ Medical Decision Making/ A&P                           Medical Decision Making Amount and/or Complexity of Data Reviewed ECG/medicine tests: ordered.   Patient's labs significant for elevated BUN/creatinine.  Worse than baseline.  Increased anion gap.  Mildly acidotic.  Also increase magnesium and phosphorus.  Will consult nephrology and patient require admission  10:30 PM Discussed with nephrology on-call and patient will need to be admitted to [facility].  Will let hospitalist know        Final Clinical Impression(s) / ED Diagnoses Final diagnoses:  None    Rx / DC Orders ED Discharge Orders     None  Lacretia Leigh, MD 05/21/21 2217    Lacretia Leigh, MD 05/21/21 2231

## 2021-05-22 ENCOUNTER — Inpatient Hospital Stay (HOSPITAL_COMMUNITY): Payer: Medicare HMO

## 2021-05-22 ENCOUNTER — Other Ambulatory Visit: Payer: Self-pay

## 2021-05-22 ENCOUNTER — Encounter (HOSPITAL_COMMUNITY): Payer: Self-pay | Admitting: Internal Medicine

## 2021-05-22 DIAGNOSIS — N179 Acute kidney failure, unspecified: Secondary | ICD-10-CM

## 2021-05-22 DIAGNOSIS — E1122 Type 2 diabetes mellitus with diabetic chronic kidney disease: Secondary | ICD-10-CM

## 2021-05-22 DIAGNOSIS — N1832 Chronic kidney disease, stage 3b: Secondary | ICD-10-CM

## 2021-05-22 DIAGNOSIS — N185 Chronic kidney disease, stage 5: Secondary | ICD-10-CM | POA: Diagnosis present

## 2021-05-22 LAB — CBC
HCT: 26.5 % — ABNORMAL LOW (ref 36.0–46.0)
Hemoglobin: 8.2 g/dL — ABNORMAL LOW (ref 12.0–15.0)
MCH: 30 pg (ref 26.0–34.0)
MCHC: 30.9 g/dL (ref 30.0–36.0)
MCV: 97.1 fL (ref 80.0–100.0)
Platelets: 133 10*3/uL — ABNORMAL LOW (ref 150–400)
RBC: 2.73 MIL/uL — ABNORMAL LOW (ref 3.87–5.11)
RDW: 17.2 % — ABNORMAL HIGH (ref 11.5–15.5)
WBC: 4.3 10*3/uL (ref 4.0–10.5)
nRBC: 0 % (ref 0.0–0.2)

## 2021-05-22 LAB — CK: Total CK: 70 U/L (ref 38–234)

## 2021-05-22 LAB — URINALYSIS, ROUTINE W REFLEX MICROSCOPIC
Bilirubin Urine: NEGATIVE
Glucose, UA: NEGATIVE mg/dL
Hgb urine dipstick: NEGATIVE
Ketones, ur: 5 mg/dL — AB
Nitrite: NEGATIVE
Protein, ur: 100 mg/dL — AB
Specific Gravity, Urine: 1.017 (ref 1.005–1.030)
WBC, UA: >50 WBC/hpf — ABNORMAL HIGH (ref 0–5)
pH: 5 (ref 5.0–8.0)

## 2021-05-22 LAB — RENAL FUNCTION PANEL
Albumin: 3.1 g/dL — ABNORMAL LOW (ref 3.5–5.0)
Albumin: 3.5 g/dL (ref 3.5–5.0)
Anion gap: 12 (ref 5–15)
Anion gap: 14 (ref 5–15)
BUN: 49 mg/dL — ABNORMAL HIGH (ref 8–23)
BUN: 54 mg/dL — ABNORMAL HIGH (ref 8–23)
CO2: 16 mmol/L — ABNORMAL LOW (ref 22–32)
CO2: 17 mmol/L — ABNORMAL LOW (ref 22–32)
Calcium: 8.2 mg/dL — ABNORMAL LOW (ref 8.9–10.3)
Calcium: 8.4 mg/dL — ABNORMAL LOW (ref 8.9–10.3)
Chloride: 108 mmol/L (ref 98–111)
Chloride: 104 mmol/L (ref 98–111)
Creatinine, Ser: 6.85 mg/dL — ABNORMAL HIGH (ref 0.44–1.00)
Creatinine, Ser: 7.21 mg/dL — ABNORMAL HIGH (ref 0.44–1.00)
GFR, Estimated: 6 mL/min — ABNORMAL LOW (ref >60)
GFR, Estimated: 5 mL/min — ABNORMAL LOW (ref >60)
Glucose, Bld: 101 mg/dL — ABNORMAL HIGH (ref 70–99)
Glucose, Bld: 163 mg/dL — ABNORMAL HIGH (ref 70–99)
Phosphorus: 5.8 mg/dL — ABNORMAL HIGH (ref 2.5–4.6)
Phosphorus: 6.3 mg/dL — ABNORMAL HIGH (ref 2.5–4.6)
Potassium: 5.4 mmol/L — ABNORMAL HIGH (ref 3.5–5.1)
Potassium: 5.2 mmol/L — ABNORMAL HIGH (ref 3.5–5.1)
Sodium: 136 mmol/L (ref 135–145)
Sodium: 135 mmol/L (ref 135–145)

## 2021-05-22 LAB — CREATININE, SERUM
Creatinine, Ser: 6.99 mg/dL — ABNORMAL HIGH (ref 0.44–1.00)
GFR, Estimated: 6 mL/min — ABNORMAL LOW (ref >60)

## 2021-05-22 LAB — CBG MONITORING, ED
Glucose-Capillary: 97 mg/dL (ref 70–99)
Glucose-Capillary: 93 mg/dL (ref 70–99)
Glucose-Capillary: 147 mg/dL — ABNORMAL HIGH (ref 70–99)
Glucose-Capillary: 153 mg/dL — ABNORMAL HIGH (ref 70–99)

## 2021-05-22 LAB — GLUCOSE, CAPILLARY: Glucose-Capillary: 189 mg/dL — ABNORMAL HIGH (ref 70–99)

## 2021-05-22 MED ORDER — SODIUM ZIRCONIUM CYCLOSILICATE 10 G PO PACK: 10.0000 g | PACK | Freq: Two times a day (BID) | ORAL | Status: DC

## 2021-05-22 MED ORDER — HEPARIN SODIUM (PORCINE) 5000 UNIT/ML IJ SOLN
5000.0000 [IU] | Freq: Three times a day (TID) | INTRAMUSCULAR | Status: DC
  Administered 2021-05-22 – 2021-05-28 (×19): 5000 [IU] via SUBCUTANEOUS
  Filled 2021-05-22 (×19): qty 1

## 2021-05-22 MED ORDER — ACETAMINOPHEN 325 MG PO TABS
650.0000 mg | ORAL_TABLET | Freq: Four times a day (QID) | ORAL | Status: DC | PRN
  Filled 2021-05-22 (×2): qty 2

## 2021-05-22 MED ORDER — HYDRALAZINE HCL 20 MG/ML IJ SOLN: 10.0000 mg | INTRAMUSCULAR | Status: DC | PRN

## 2021-05-22 MED ORDER — SODIUM BICARBONATE 650 MG PO TABS
650.0000 mg | ORAL_TABLET | Freq: Three times a day (TID) | ORAL | Status: DC
  Administered 2021-05-22 (×3): 650 mg via ORAL
  Filled 2021-05-22 (×4): qty 1

## 2021-05-22 MED ORDER — AMLODIPINE BESYLATE 5 MG PO TABS: 10.0000 mg | ORAL_TABLET | Freq: Every day | ORAL | Status: DC

## 2021-05-22 MED ORDER — SODIUM ZIRCONIUM CYCLOSILICATE 5 G PO PACK
5.0000 g | PACK | Freq: Two times a day (BID) | ORAL | Status: DC
  Administered 2021-05-22 – 2021-05-24 (×5): 5 g via ORAL
  Filled 2021-05-22 (×6): qty 1

## 2021-05-22 MED ORDER — PRAVASTATIN SODIUM 10 MG PO TABS
20.0000 mg | ORAL_TABLET | Freq: Every day | ORAL | Status: DC
Start: 2021-05-22 — End: 2021-05-28
  Administered 2021-05-22 – 2021-05-28 (×7): 20 mg via ORAL
  Filled 2021-05-22 (×2): qty 2
  Filled 2021-05-22: qty 1
  Filled 2021-05-22 (×4): qty 2

## 2021-05-22 MED ORDER — SODIUM CHLORIDE 0.9 % IV SOLN
1.0000 g | INTRAVENOUS | Status: DC
  Administered 2021-05-22 – 2021-05-24 (×3): 1 g via INTRAVENOUS
  Filled 2021-05-22 (×3): qty 10

## 2021-05-22 MED ORDER — HYDRALAZINE HCL 25 MG PO TABS: 50.0000 mg | ORAL_TABLET | Freq: Three times a day (TID) | ORAL | Status: DC

## 2021-05-22 MED ORDER — ACETAMINOPHEN 650 MG RE SUPP: 650.0000 mg | Freq: Four times a day (QID) | RECTAL | Status: DC | PRN

## 2021-05-22 MED ORDER — ISOSORBIDE DINITRATE 20 MG PO TABS
30.0000 mg | ORAL_TABLET | Freq: Three times a day (TID) | ORAL | Status: DC
  Filled 2021-05-22 (×2): qty 1

## 2021-05-22 MED ORDER — INSULIN ASPART 100 UNIT/ML IJ SOLN
0.0000 [IU] | Freq: Three times a day (TID) | INTRAMUSCULAR | Status: DC
  Administered 2021-05-23: 2 [IU] via SUBCUTANEOUS
  Administered 2021-05-23: 1 [IU] via SUBCUTANEOUS
  Administered 2021-05-23 – 2021-05-24 (×2): 2 [IU] via SUBCUTANEOUS
  Administered 2021-05-24 – 2021-05-25 (×3): 1 [IU] via SUBCUTANEOUS
  Administered 2021-05-25 (×2): 2 [IU] via SUBCUTANEOUS
  Administered 2021-05-26 – 2021-05-27 (×4): 1 [IU] via SUBCUTANEOUS
  Administered 2021-05-27 – 2021-05-28 (×2): 2 [IU] via SUBCUTANEOUS
  Administered 2021-05-28: 1 [IU] via SUBCUTANEOUS
  Filled 2021-05-22: qty 0.06

## 2021-05-22 NOTE — ED Notes (Signed)
Attempted to call report to [facility]. [facility] unable to take report at this time. Will re-attempt.

## 2021-05-22 NOTE — ED Notes (Signed)
Report sent to Rosalita Levan, RN at [facility].

## 2021-05-22 NOTE — H&P (Signed)
History and Physical    [name] BTY:[id number] DOB: 14-Apr-1941 DOA: 05/21/2021  PCP: Trey Sailors, PA  Patient coming from: Home.  Chief Complaint: Abnormal labs.  HPI: [name] is a 81 y.o. female with history of chronic kidney disease stage III, anemia, hypertension, diabetes mellitus type 2 admitted last month for sepsis from UTI with E. coli bacteremia was advised to come to the ER after patient's primary care physician did labs which showed elevated creatinine.  Patient states she did not have any nausea vomiting abdominal pain did not take any NSAIDs.  ED Course: In the ER patient's labs show creatinine of 6.7 about a month ago it was 2.4 then about a week ago it was 3.2.  UA still pending.  Patient states she still makes urine.  ER physician discussed with on-call nephrologist who advised transferring patient to [facility] and admitting for further work-up of acute on chronic disease.  Review of Systems: As per HPI, rest all negative.   Past Medical History:  Diagnosis Date   Diabetes mellitus without complication ([place])    Hypertension     Past Surgical History:  Procedure Laterality Date   CARDIOVERSION N/A 01/14/2021   Procedure: CARDIOVERSION;  Surgeon: Adrian Prows, MD;  Location: [facility];  Service: Cardiovascular;  Laterality: N/A;   TONSILLECTOMY       reports that she has never smoked. She has never used smokeless tobacco. She reports that she does not drink alcohol and does not use drugs.  Allergies  Allergen Reactions   Amlodipine Swelling    Family History  Family history unknown: Yes    Prior to Admission medications   Medication Sig Start Date End Date Taking? Authorizing Provider  amLODipine (NORVASC) 10 MG tablet Take 10 mg by mouth daily. 04/08/21  Yes [provider]  furosemide (LASIX) 20 MG tablet Take 20 mg by mouth daily.   Yes [provider]  HUMULIN 70/30 KWIKPEN (70-30) 100 UNIT/ML KwikPen Inject 15 Units  into the skin daily. 04/11/21  Yes [provider]  hydrALAZINE (APRESOLINE) 50 MG tablet Take 1 tablet (50 mg total) by mouth 3 (three) times daily. 05/12/21 08/10/21 Yes Cantwell, Celeste C, PA-C  isosorbide dinitrate (ISORDIL) 30 MG tablet Take 30 mg by mouth 3 (three) times daily.   Yes [provider]  Menthol-Methyl Salicylate (MUSCLE RUB EX) Apply 1 application topically daily as needed (leg cramps).   Yes [provider]  olmesartan (BENICAR) 20 MG tablet Take 20 mg by mouth daily.   Yes [provider]  orphenadrine (NORFLEX) 100 MG tablet Take 100 mg by mouth 2 (two) times daily as needed for muscle spasms. 04/23/21  Yes [provider]  pravastatin (PRAVACHOL) 20 MG tablet Take 1 tablet (20 mg total) by mouth daily. 05/12/21  Yes Cantwell, Celeste C, PA-C  Accu-Chek FastClix Lancets MISC See admin instructions. 01/16/19   [provider]  BD PEN NEEDLE NANO 2ND GEN 32G X 4 MM MISC 3 (three) times daily. as directed 08/13/19   [provider]  Blood Glucose Monitoring Suppl (ACCU-CHEK GUIDE ME) w/Device KIT See admin instructions. 12/11/18   [provider]  cephALEXin (KEFLEX) 500 MG capsule Take 500 mg by mouth 3 (three) times daily. Patient not taking: Reported on 05/21/2021 05/12/21   [provider]  diltiazem (CARDIZEM CD) 120 MG 24 hr capsule Take 120 mg by mouth daily. Patient not taking: Reported on 05/21/2021 02/27/21   [provider]  glucose blood test strip TEST BLOOD SUGARS 3 4 TIMES DAILY. 10/27/17   [provider]    Physical Exam: Constitutional: Moderately built and nourished. Vitals:   05/21/21 2230 05/21/21 2245 05/21/21 2300 05/21/21 2315  BP: (!) 114/97 (!) 112/58 (!) 121/55 (!) 120/52  Pulse: 65 66 64 64  Resp: '16 12 15 14  ' Temp:      TempSrc:      SpO2: 100% 100% 100% 100%  Weight:      Height:       Eyes: Anicteric no pallor. ENMT: No discharge from the ears  eyes nose and mouth. Neck: No mass felt.  No neck rigidity. Respiratory: No rhonchi or crepitations. Cardiovascular: S1-S2 heard. Abdomen: Soft nontender bowel sound present. Musculoskeletal: Mild edema of the lower extremities. Skin: No rash. Neurologic: Alert awake oriented time place and person.  Moves all extremities. Psychiatric: Appears normal.  Normal affect.   Labs on Admission: I have personally reviewed following labs and imaging studies  CBC: Recent Labs  Lab 05/21/21 2002  WBC 5.0  NEUTROABS 3.2  HGB 9.0*  HCT 29.0*  MCV 95.1  PLT 099   Basic Metabolic Panel: Recent Labs  Lab 05/20/21 1159 05/21/21 2002  NA 140 138  K 5.0 5.0  CL 107* 98  CO2 17* 17*  GLUCOSE 64* 108*  BUN 37* 50*  CREATININE 6.08* 6.77*  CALCIUM 8.7 8.7*  MG  --  2.9*  PHOS  --  5.5*   GFR: Estimated Creatinine Clearance: 7 mL/min (A) (by C-G formula based on SCr of 6.77 mg/dL (H)). Liver Function Tests: Recent Labs  Lab 05/21/21 2002  AST 19  ALT 11  ALKPHOS 81  BILITOT 0.7  PROT 7.5  ALBUMIN 3.6   No results for input(s): LIPASE, AMYLASE in the last 168 hours. No results for input(s): AMMONIA in the last 168 hours. Coagulation Profile: No results for input(s): INR, PROTIME in the last 168 hours. Cardiac Enzymes: No results for input(s): CKTOTAL, CKMB, CKMBINDEX, TROPONINI in the last 168 hours. BNP (last 3 results) Recent Labs    05/12/21 1311 05/20/21 1159  PROBNP 6,865* 10,030*   HbA1C: No results for input(s): HGBA1C in the last 72 hours. CBG: No results for input(s): GLUCAP in the last 168 hours. Lipid Profile: No results for input(s): CHOL, HDL, LDLCALC, TRIG, CHOLHDL, LDLDIRECT in the last 72 hours. Thyroid Function Tests: No results for input(s): TSH, T4TOTAL, FREET4, T3FREE, THYROIDAB in the last 72 hours. Anemia Panel: No results for input(s): VITAMINB12, FOLATE, FERRITIN, TIBC, IRON, RETICCTPCT in the last 72 hours. Urine analysis:    Component  Value Date/Time   COLORURINE YELLOW 04/24/2021 1835   APPEARANCEUR CLOUDY (A) 04/24/2021 1835   LABSPEC 1.020 04/24/2021 1835   PHURINE 6.0 04/24/2021 1835   GLUCOSEU NEGATIVE 04/24/2021 1835   HGBUR LARGE (A) 04/24/2021 1835   BILIRUBINUR NEGATIVE 04/24/2021 1835   KETONESUR NEGATIVE 04/24/2021 1835   PROTEINUR >300 (A) 04/24/2021 1835   NITRITE POSITIVE (A) 04/24/2021 1835   LEUKOCYTESUR MODERATE (A) 04/24/2021 1835   Sepsis Labs: '@LABRCNTIP' (procalcitonin:4,lacticidven:4) ) Recent Results (from the past 240 hour(s))  Resp Panel by RT-PCR (Flu A&B, Covid) Nasopharyngeal Swab     Status: None   Collection Time: 05/21/21 10:21 PM   Specimen: Nasopharyngeal Swab; Nasopharyngeal(NP) swabs in vial transport medium  Result Value Ref Range Status   SARS Coronavirus 2 by RT PCR NEGATIVE NEGATIVE Final    Comment: (NOTE) SARS-CoV-2 target nucleic acids are NOT DETECTED.  The SARS-CoV-2 RNA is generally detectable in upper respiratory specimens during the acute phase of infection. The lowest concentration of SARS-CoV-2 viral copies this assay can detect is 138 copies/mL. A negative result does not preclude SARS-Cov-2 infection and should not be used as the sole basis for treatment or other patient management decisions. A negative result may occur with  improper specimen collection/handling, submission of specimen other than nasopharyngeal swab, presence of viral mutation(s) within the areas targeted by this assay, and inadequate number of viral copies(<138 copies/mL). A negative result must be combined with clinical observations, patient history, and epidemiological information. The expected result is Negative.  Fact Sheet for Patients:  EntrepreneurPulse.com.au  Fact Sheet for Healthcare Providers:  IncredibleEmployment.be  This test is no t yet approved or cleared by the [place] FDA and  has been authorized for detection and/or diagnosis  of SARS-CoV-2 by FDA under an Emergency Use Authorization (EUA). This EUA will remain  in effect (meaning this test can be used) for the duration of the COVID-19 declaration under Section 564(b)(1) of the Act, 21 U.S.C.section 360bbb-3(b)(1), unless the authorization is terminated  or revoked sooner.       Influenza A by PCR NEGATIVE NEGATIVE Final   Influenza B by PCR NEGATIVE NEGATIVE Final    Comment: (NOTE) The Xpert Xpress SARS-CoV-2/FLU/RSV plus assay is intended as an aid in the diagnosis of influenza from Nasopharyngeal swab specimens and should not be used as a sole basis for treatment. Nasal washings and aspirates are unacceptable for Xpert Xpress SARS-CoV-2/FLU/RSV testing.  Fact Sheet for Patients: EntrepreneurPulse.com.au  Fact Sheet for Healthcare Providers: IncredibleEmployment.be  This test is not yet approved or cleared by the [place] FDA and has been authorized for detection and/or diagnosis of SARS-CoV-2 by FDA under an Emergency Use Authorization (EUA). This EUA will remain in effect (meaning this test can be used) for the duration of the COVID-19 declaration under Section 564(b)(1) of the Act, 21 U.S.C. section 360bbb-3(b)(1), unless the authorization is terminated or revoked.  Performed at [facility], [place] [address] [place], [place] [address]      Radiological Exams on Admission: No results found.  EKG: Independently reviewed.  A. fib.  Assessment/Plan Principal Problem:   ARF (acute renal failure) (HCC) Active Problems:   Essential hypertension   DM (diabetes mellitus), type 2 with renal complications (HCC)   CKD (chronic kidney disease), stage V (HCC)    Acute on chronic kidney disease stage III with metabolic acidosis with progressive renal failure patient still makes urine as per the patient.  UA is pending.  I have ordered renal ultrasound.  Patient be transferred to [facility]  as requested by nephrologist.  We will hold off patient's ARB patient looks like takes Benicar.  Avoid nephrotoxins.  Closely follow intake output metabolic panel. Diabetes mellitus type 2 we will keep patient on very sensitive sliding scale coverage at this time. Hypertension on amlodipine hydralazine and Isordil.  Holding Benicar due to acute renal failure. Anemia hemoglobin appears to be at baseline.  Follow CBC check anemia panel. Recent admission for sepsis from E. coli bacteremia. History of A. fib not on anticoagulation.  Since patient is acute on chronic kidney disease will need further work-up and inpatient status.   DVT prophylaxis: Heparin. Code Status: Full code. Family Communication: Daughter at the bedside. Disposition Plan: Home. Consults called: ER physician discussed with nephrologist. Admission status: Inpatient.   Rise Patience MD [facility] Pager [phone].  If 7PM-7AM, please contact night-coverage [email or web address] Password [facility]  05/22/2021, 12:23 AM

## 2021-05-22 NOTE — ED Notes (Signed)
Pt attached to cardiac monitor x3. VSS. Pt alert and oriented to person and place, however, answers incorrectly to current year and current President. Pt oriented to current month.

## 2021-05-22 NOTE — ED Notes (Signed)
Pt brief changed and pure wick placed and hooked to suction. Pt re-adjusted in ED stretcher and sitting upright eating breakfast. Attached to cardiac monitor x3. VSS.

## 2021-05-22 NOTE — ED Notes (Signed)
[facility]

## 2021-05-22 NOTE — ED Notes (Signed)
[facility] contacted for transport  

## 2021-05-22 NOTE — ED Notes (Signed)
Lunch provided.

## 2021-05-22 NOTE — Progress Notes (Signed)
[name]  [id number] DOB: 1940-05-15 DOA: 05/21/2021 PCP: Trey Sailors, PA    Brief Narrative:  [phone] with a history of CKD stage IIIb, anemia of CKD, HTN, DM2, and admission December 2022 with sepsis related to E. coli UTI and bacteremia who was found to have a significantly elevated creatinine during a routine check by her PCP and was therefore directed to the ED.  The patient denied any specific symptoms.  In the ER the patient was found to have a creatinine of 6.7.  It had been 2.18-month previously, and 3.21-week previously.  Consultants:  Nephrology  Code Status: FULL CODE  Antimicrobials:  None  DVT prophylaxis: Subcutaneous heparin  Interim Hx: The patient was examined and interviewed by one of my partners earlier today.  Assessment & Plan:  Acute renal failure on CKD stage IIIb Renal ultrasound without evidence of acute hydronephrosis/obstruction  Metabolic acidosis Add sodium bicarb - due to above   Hyperkalemia Initiate lokelma - appears patient may soon require HD  Bilateral pleural effusions and ascites Incidentally noted during ultrasound of abdomen  DM2  HTN  Anemia of CKD  Remote history of atrial fibrillation Not on chronic anticoagulation   Family Communication:  Disposition: transfer to [facility]   Objective: Blood pressure (!) 153/68, pulse 67, temperature 98.2 F (36.8 C), temperature source Oral, resp. rate 12, height 5\' 4"  (1.626 m), weight 82.6 kg, SpO2 97 %. No intake or output data in the 24 hours ending 05/22/21 1843 Filed Weights   05/21/21 1926  Weight: 82.6 kg    Examination: Pt seen for a f/u exam  CBC: Recent Labs  Lab 05/21/21 2002 05/22/21 0603  WBC 5.0 4.3  NEUTROABS 3.2  --   HGB 9.0* 8.2*  HCT 29.0* 26.5*  MCV 95.1 97.1  PLT 170 627*   Basic Metabolic Panel: Recent Labs  Lab 05/21/21 2002 05/22/21 0603 05/22/21 1729  NA 138 136 135  K 5.0 5.4* 5.2*  CL 98 108 104  CO2 17* 16* 17*  GLUCOSE  108* 101* 163*  BUN 50* 49* 54*  CREATININE 6.77* 6.85*   6.99* 7.21*  CALCIUM 8.7* 8.2* 8.4*  MG 2.9*  --   --   PHOS 5.5* 5.8* 6.3*   GFR: Estimated Creatinine Clearance: 6.6 mL/min (A) (by C-G formula based on SCr of 7.21 mg/dL (H)).  Liver Function Tests: Recent Labs  Lab 05/21/21 2002 05/22/21 0603 05/22/21 1729  AST 19  --   --   ALT 11  --   --   ALKPHOS 81  --   --   BILITOT 0.7  --   --   PROT 7.5  --   --   ALBUMIN 3.6 3.1* 3.5    Cardiac Enzymes: Recent Labs  Lab 05/22/21 0603  CKTOTAL 70    HbA1C: Hgb A1c MFr Bld  Date/Time Value Ref Range Status  04/25/2021 04:13 AM 9.6 (H) 4.8 - 5.6 % Final    Comment:    (NOTE) Pre diabetes:          5.7%-6.4%  Diabetes:              >6.4%  Glycemic control for   <7.0% adults with diabetes   09/06/2020 06:05 AM 10.4 (H) 4.8 - 5.6 % Final    Comment:    (NOTE) Pre diabetes:          5.7%-6.4%  Diabetes:              >  6.4%  Glycemic control for   <7.0% adults with diabetes     CBG: Recent Labs  Lab 05/22/21 0740 05/22/21 0914 05/22/21 1241 05/22/21 1728  GLUCAP 97 93 147* 153*    Recent Results (from the past 240 hour(s))  Resp Panel by RT-PCR (Flu A&B, Covid) Nasopharyngeal Swab     Status: None   Collection Time: 05/21/21 10:21 PM   Specimen: Nasopharyngeal Swab; Nasopharyngeal(NP) swabs in vial transport medium  Result Value Ref Range Status   SARS Coronavirus 2 by RT PCR NEGATIVE NEGATIVE Final    Comment: (NOTE) SARS-CoV-2 target nucleic acids are NOT DETECTED.  The SARS-CoV-2 RNA is generally detectable in upper respiratory specimens during the acute phase of infection. The lowest concentration of SARS-CoV-2 viral copies this assay can detect is 138 copies/mL. A negative result does not preclude SARS-Cov-2 infection and should not be used as the sole basis for treatment or other patient management decisions. A negative result may occur with  improper specimen collection/handling,  submission of specimen other than nasopharyngeal swab, presence of viral mutation(s) within the areas targeted by this assay, and inadequate number of viral copies(<138 copies/mL). A negative result must be combined with clinical observations, patient history, and epidemiological information. The expected result is Negative.  Fact Sheet for Patients:  EntrepreneurPulse.com.au  Fact Sheet for Healthcare Providers:  IncredibleEmployment.be  This test is no t yet approved or cleared by the [place] FDA and  has been authorized for detection and/or diagnosis of SARS-CoV-2 by FDA under an Emergency Use Authorization (EUA). This EUA will remain  in effect (meaning this test can be used) for the duration of the COVID-19 declaration under Section 564(b)(1) of the Act, 21 U.S.C.section 360bbb-3(b)(1), unless the authorization is terminated  or revoked sooner.       Influenza A by PCR NEGATIVE NEGATIVE Final   Influenza B by PCR NEGATIVE NEGATIVE Final    Comment: (NOTE) The Xpert Xpress SARS-CoV-2/FLU/RSV plus assay is intended as an aid in the diagnosis of influenza from Nasopharyngeal swab specimens and should not be used as a sole basis for treatment. Nasal washings and aspirates are unacceptable for Xpert Xpress SARS-CoV-2/FLU/RSV testing.  Fact Sheet for Patients: EntrepreneurPulse.com.au  Fact Sheet for Healthcare Providers: IncredibleEmployment.be  This test is not yet approved or cleared by the [place] FDA and has been authorized for detection and/or diagnosis of SARS-CoV-2 by FDA under an Emergency Use Authorization (EUA). This EUA will remain in effect (meaning this test can be used) for the duration of the COVID-19 declaration under Section 564(b)(1) of the Act, 21 U.S.C. section 360bbb-3(b)(1), unless the authorization is terminated or revoked.  Performed at [facility], [place] [address] [place], [place] [address]      Scheduled Meds:  heparin  5,000 Units Subcutaneous Q8H   insulin aspart  0-6 Units Subcutaneous TID WC   pravastatin  20 mg Oral Daily   sodium bicarbonate  650 mg Oral TID   sodium zirconium cyclosilicate  5 g Oral BID     LOS: 1 day   Cherene Altes, MD [facility] Office  [phone] Pager - Text Page per Shea Evans  If 7PM-7AM, please contact night-coverage per Amion 05/22/2021, 6:43 PM

## 2021-05-22 NOTE — Consult Note (Signed)
Nephrology Consult   Requesting provider: Jo[facility] Catching Service requesting consult: Hospitalist Reason for consult: AKI on CKD   Assessment/Recommendations: [name] is a/an 81 y.o. female with a past medical history CKD, anemia, HTN, DM2 and recent E coli bacteremia who present w/ AKI and volume overload   AKI on CKD4: Hard to definitively define baseline creatinine.  Seems to be worsening over the past year.  Likely most recent baseline is around 2.5-3.  Now with creatinine up to nearly 7.  Urinalysis with concerns of ongoing urinary tract infection.  Also with signs of volume overload on exam.  Blood pressures borderline low here.  I think most likely she suffered ATN from NSAID use, ARB use, borderline hypotension.  She has no definitive need for renal replacement therapy at this time but agree with transfer to [facility] given that if she does not improve she will need renal replacement therapy. -No fluids or diuretics at this time.  Continue to monitor volume status closely -With holding ARB -Continue to monitor daily Cr, Dose meds for GFR -Monitor Daily I/Os, Daily weight  -Maintain MAP>65 for optimal renal perfusion.  -Avoid nephrotoxic medications including NSAIDs and Vanc/Zosyn combo -UTI treatment as below  Hyperkalemia: Associated with AKI.  Agree with Lokelma as well as acidosis treatment  Metabolic Acidosis: Associated with AKI.  Agree with sodium bicarbonate 650 mg 3 times daily  UTI/recent E. coli bacteremia: Urinalysis with ongoing concerns of UTI.  Will check urine culture as well as blood cultures given recent bacteremia.  Start ceftriaxone daily  Acute exacerbation of diastolic heart failure: Volume overload on exam with bilateral lower extremity edema as well as some shortness of breath.  Given her AKI would hold diuretics at this time and restart once kidney function is improving or unless the patient develops hypoxia  Hypertension: Blood pressure borderline  low at this time.  Hold home medications  Anemia: Unclear cause.  CKD likely playing minimal role.  Likely chronic inflammation.  Continue to monitor and transfuse as needed  DM2: Management per primary team   Recommendations conveyed to primary service.    [facility] 05/22/2021 2:00 PM   _____________________________________________________________________________________ CC: AKI  History of Present Illness: [name] is a/an 81 y.o. female with a past medical history of CKD, anemia, HTN, DM2 who presents with abnormal labs.  The patient seems slightly confused so history was obtained per chart review as well as based on patient's history.  The patient states to me that she was sent here by her primary care physician because of abnormal labs.  When I suggested that this was related to her kidney function being worse she also stated that it was because she had an infection in her blood.  It is possible that she is remembering her prior hospitalization for E. coli bacteremia associated with the UTI.  She states that over the past 2 weeks she has had urinary incontinence without significant dysuria or hematuria.  She has continued to urinate normally at home.  Over the past 2 weeks she has also noticed worsening lower extremity edema as well as some shortness of breath with exertion.  She denies any chest pain, nausea, vomiting, diarrhea, fevers, chills.  She takes NSAIDs nightly to help her sleep.  She will typically take an Aleve or an Advil every night.  She was on an ARB which was recently stopped by cardiology from what I can tell.  Patient is unsure when she stopped taking  it.  In the emergency department she was hemodynamically stable.  Labs showed a creatinine of 6.7.  It was recommended that she be admitted for further evaluation and monitoring. UA later showed signs of persistent pyuria. Renal US was without concern.   Medications:  Current  Facility-Administered Medications  Medication Dose Route Frequency Provider Last Rate Last Admin   acetaminophen (TYLENOL) tablet 650 mg  650 mg Oral Q6H PRN Rise Patience, MD       heparin injection 5,000 Units  5,000 Units Subcutaneous Q8H Rise Patience, MD   5,000 Units at 05/22/21 0602   hydrALAZINE (APRESOLINE) injection 10 mg  10 mg Intravenous Q4H PRN Rise Patience, MD       insulin aspart (novoLOG) injection 0-6 Units  0-6 Units Subcutaneous TID WC Rise Patience, MD       pravastatin (PRAVACHOL) tablet 20 mg  20 mg Oral Daily Rise Patience, MD       sodium bicarbonate tablet 650 mg  650 mg Oral TID Cherene Altes, MD   650 mg at 05/22/21 0915   sodium zirconium cyclosilicate (LOKELMA) packet 5 g  5 g Oral BID Cherene Altes, MD   5 g at 05/22/21 8502   Current Outpatient Medications  Medication Sig Dispense Refill   amLODipine (NORVASC) 10 MG tablet Take 10 mg by mouth daily.     furosemide (LASIX) 20 MG tablet Take 20 mg by mouth daily.     HUMULIN 70/30 KWIKPEN (70-30) 100 UNIT/ML KwikPen Inject 15 Units into the skin daily.     hydrALAZINE (APRESOLINE) 50 MG tablet Take 1 tablet (50 mg total) by mouth 3 (three) times daily. 270 tablet 3   isosorbide dinitrate (ISORDIL) 30 MG tablet Take 30 mg by mouth 3 (three) times daily.     Menthol-Methyl Salicylate (MUSCLE RUB EX) Apply 1 application topically daily as needed (leg cramps).     olmesartan (BENICAR) 20 MG tablet Take 20 mg by mouth daily.     orphenadrine (NORFLEX) 100 MG tablet Take 100 mg by mouth 2 (two) times daily as needed for muscle spasms.     pravastatin (PRAVACHOL) 20 MG tablet Take 1 tablet (20 mg total) by mouth daily. 90 tablet 3   Accu-Chek FastClix Lancets MISC See admin instructions.     BD PEN NEEDLE NANO 2ND GEN 32G X 4 MM MISC 3 (three) times daily. as directed     Blood Glucose Monitoring Suppl (ACCU-CHEK GUIDE ME) w/Device KIT See admin instructions.     cephALEXin  (KEFLEX) 500 MG capsule Take 500 mg by mouth 3 (three) times daily. (Patient not taking: Reported on 05/21/2021)     diltiazem (CARDIZEM CD) 120 MG 24 hr capsule Take 120 mg by mouth daily. (Patient not taking: Reported on 05/21/2021)     glucose blood test strip TEST BLOOD SUGARS 3 4 TIMES DAILY.       ALLERGIES Amlodipine  MEDICAL HISTORY Past Medical History:  Diagnosis Date   Diabetes mellitus without complication ([place])    Hypertension      SOCIAL HISTORY Social History   Socioeconomic History   Marital status: Divorced    Spouse name: Not on file   Number of children: 5   Years of education: Not on file   Highest education level: Not on file  Occupational History   Not on file  Tobacco Use   Smoking status: Never   Smokeless tobacco: Never  Vaping Use  Vaping Use: Never used  Substance and Sexual Activity   Alcohol use: No   Drug use: No   Sexual activity: Not on file  Other Topics Concern   Not on file  Social History Narrative   4 living children   Social Determinants of Health   Financial Resource Strain: Not on file  Food Insecurity: Not on file  Transportation Needs: Not on file  Physical Activity: Not on file  Stress: Not on file  Social Connections: Not on file  Intimate Partner Violence: Not on file     FAMILY HISTORY Family History  Family history unknown: Yes      Review of Systems: 12 systems reviewed Otherwise as per HPI, all other systems reviewed and negative  Physical Exam: Vitals:   05/22/21 1215 05/22/21 1330  BP: 128/64 140/63  Pulse: 61 65  Resp: 15 16  Temp:    SpO2: 98% 100%   No intake/output data recorded. No intake or output data in the 24 hours ending 05/22/21 1400 General: well-appearing, no acute distress HEENT: anicteric sclera, oropharynx clear without lesions CV: normal rate, nor murmurs, 1 plus pitting edema in BLE but also with thigh and sacral edema Lungs: clear to auscultation bilaterally, normal work of  breathing Abd: soft, non-tender, non-distended Skin: no visible lesions or rashes Psych: alert, engaged, appropriate mood and affect Musculoskeletal: no obvious deformities Neuro: normal speech, no gross focal deficits   Test Results Reviewed Lab Results  Component Value Date   NA 136 05/22/2021   K 5.4 (H) 05/22/2021   CL 108 05/22/2021   CO2 16 (L) 05/22/2021   BUN 49 (H) 05/22/2021   CREATININE 6.99 (H) 05/22/2021   CREATININE 6.85 (H) 05/22/2021   CALCIUM 8.2 (L) 05/22/2021   ALBUMIN 3.1 (L) 05/22/2021   PHOS 5.8 (H) 05/22/2021     I have reviewed all relevant outside healthcare records related to the patient's current hospitalization

## 2021-05-22 NOTE — ED Notes (Signed)
Dinner tray provided

## 2021-05-23 LAB — COMPREHENSIVE METABOLIC PANEL
ALT: 11 U/L (ref 0–44)
AST: 18 U/L (ref 15–41)
Albumin: 3.1 g/dL — ABNORMAL LOW (ref 3.5–5.0)
Alkaline Phosphatase: 75 U/L (ref 38–126)
Anion gap: 13 (ref 5–15)
BUN: 49 mg/dL — ABNORMAL HIGH (ref 8–23)
CO2: 16 mmol/L — ABNORMAL LOW (ref 22–32)
Calcium: 8.4 mg/dL — ABNORMAL LOW (ref 8.9–10.3)
Chloride: 106 mmol/L (ref 98–111)
Creatinine, Ser: 7.16 mg/dL — ABNORMAL HIGH (ref 0.44–1.00)
GFR, Estimated: 5 mL/min — ABNORMAL LOW (ref >60)
Glucose, Bld: 248 mg/dL — ABNORMAL HIGH (ref 70–99)
Potassium: 5 mmol/L (ref 3.5–5.1)
Sodium: 135 mmol/L (ref 135–145)
Total Bilirubin: 0.4 mg/dL (ref 0.3–1.2)
Total Protein: 6.7 g/dL (ref 6.5–8.1)

## 2021-05-23 LAB — MAGNESIUM: Magnesium: 2.5 mg/dL — ABNORMAL HIGH (ref 1.7–2.4)

## 2021-05-23 LAB — CBC
HCT: 26.3 % — ABNORMAL LOW (ref 36.0–46.0)
Hemoglobin: 8.1 g/dL — ABNORMAL LOW (ref 12.0–15.0)
MCH: 29.5 pg (ref 26.0–34.0)
MCHC: 30.8 g/dL (ref 30.0–36.0)
MCV: 95.6 fL (ref 80.0–100.0)
Platelets: 144 10*3/uL — ABNORMAL LOW (ref 150–400)
RBC: 2.75 MIL/uL — ABNORMAL LOW (ref 3.87–5.11)
RDW: 17.2 % — ABNORMAL HIGH (ref 11.5–15.5)
WBC: 4.4 10*3/uL (ref 4.0–10.5)
nRBC: 0 % (ref 0.0–0.2)

## 2021-05-23 LAB — GLUCOSE, CAPILLARY
Glucose-Capillary: 232 mg/dL — ABNORMAL HIGH (ref 70–99)
Glucose-Capillary: 187 mg/dL — ABNORMAL HIGH (ref 70–99)
Glucose-Capillary: 247 mg/dL — ABNORMAL HIGH (ref 70–99)
Glucose-Capillary: 228 mg/dL — ABNORMAL HIGH (ref 70–99)

## 2021-05-23 LAB — PHOSPHORUS: Phosphorus: 5.9 mg/dL — ABNORMAL HIGH (ref 2.5–4.6)

## 2021-05-23 MED ORDER — SODIUM BICARBONATE 650 MG PO TABS
1300.0000 mg | ORAL_TABLET | Freq: Three times a day (TID) | ORAL | Status: DC
  Administered 2021-05-23 – 2021-05-28 (×16): 1300 mg via ORAL
  Filled 2021-05-23 (×13): qty 2

## 2021-05-23 MED ORDER — RENA-VITE PO TABS
1.0000 | ORAL_TABLET | Freq: Every day | ORAL | Status: DC
  Administered 2021-05-23 – 2021-05-27 (×5): 1 via ORAL
  Filled 2021-05-23 (×5): qty 1

## 2021-05-23 MED ORDER — NEPRO/CARBSTEADY PO LIQD
237.0000 mL | ORAL | Status: DC
  Administered 2021-05-23 – 2021-05-27 (×4): 237 mL via ORAL

## 2021-05-23 NOTE — Progress Notes (Signed)
Initial Nutrition Assessment  DOCUMENTATION CODES:   Not applicable  INTERVENTION:   Nepro Shake po daily, each supplement provides 425 kcal and 19 grams protein  Add Renal MVI daily  If po intake inadequate on follow-up, recommend liberalizing diet  NUTRITION DIAGNOSIS:   Increased nutrient needs related to acute illness as evidenced by estimated needs.  GOAL:   Patient will meet greater than or equal to 90% of their needs  MONITOR:   PO intake, Supplement acceptance, Labs, Weight trends  REASON FOR ASSESSMENT:   Malnutrition Screening Tool    ASSESSMENT:   81 yo female admitted with AKI with CKD IV, volume overload, acidosis and hyperkalemia. PMH includes DM, HTN, anemia, CKD  Nephrology, no plan for RRT at present  Pt ate eggs and toast for breakfast; recorded po intake 75%. Pt tells MD appetite is stable  Current wt 82.6 kg; mild generalized edema per RN assessment  Noted pt recently diagnosed with moderate malnutrition; unable to further assessment malnutrition status at this time  Labs: phosphorus 5.9 (H), potassium 5.0, Creatinine 7.16 Meds: ss novolog, sodium bicarb tabs, lokelma BID  NUTRITION - FOCUSED PHYSICAL EXAM:  Unable to assess  Diet Order:   Diet Order             Diet renal/carb modified with fluid restriction Diet-HS Snack? Nothing; Fluid restriction: 1200 mL Fluid; Room service appropriate? Yes; Fluid consistency: Thin  Diet effective now                   EDUCATION NEEDS:   Not appropriate for education at this time  Skin:  Skin Assessment: Reviewed RN Assessment  Last BM:  PTA  Height:   Ht Readings from Last 1 Encounters:  05/21/21 5\' 4"  (1.626 m)    Weight:   Wt Readings from Last 1 Encounters:  05/21/21 82.6 kg    BMI:  Body mass index is 31.24 kg/m.  Estimated Nutritional Needs:   Kcal:  1700-1900 kcals  Protein:  85-95 g  Fluid:  1.7 L  Kerman Passey MS, RDN, LDN, CNSC Registered Dietitian  III Clinical Nutrition RD Pager and On-Call Pager Number Located in [place]

## 2021-05-23 NOTE — Progress Notes (Signed)
[facility] Progress Note   Subjective:   Feels well.  No uremic symptoms.  PO intake reported normal; had eggs, toast for breakfast. No UOP documented overnight.  She confirms it's scant urine.   Objective Vitals:   05/22/21 2000 05/22/21 2100 05/22/21 2321 05/23/21 0413  BP: (!) 157/65 (!) 121/59 (!) 120/51 (!) 127/53  Pulse: 77 78 77 73  Resp: 15 18 16 17   Temp:  98.1 F (36.7 C) 97.9 F (36.6 C) 98 F (36.7 C)  TempSrc:  Oral  Oral  SpO2: 99% 99% 100% 96%  Weight:      Height:       Physical Exam General: well-appearing, no acute distress  HEENT: anicteric sclera, oropharynx clear without lesions CV: normal rate, nor murmurs, 1 plus pitting edema in BLE but also with thigh and sacral edema Lungs: clear to auscultation bilaterally, normal work of breathing Abd: soft, non-tender, non-distended Skin: no visible lesions or rashes Psych: alert, engaged, appropriate mood and affect Musculoskeletal: no obvious deformities Neuro: normal speech, no gross focal deficits   Additional Objective Labs: Basic Metabolic Panel: Recent Labs  Lab 05/22/21 0603 05/22/21 1729 05/23/21 0010  NA 136 135 135  K 5.4* 5.2* 5.0  CL 108 104 106  CO2 16* 17* 16*  GLUCOSE 101* 163* 248*  BUN 49* 54* 49*  CREATININE 6.85*   6.99* 7.21* 7.16*  CALCIUM 8.2* 8.4* 8.4*  PHOS 5.8* 6.3* 5.9*   Liver Function Tests: Recent Labs  Lab 05/21/21 2002 05/22/21 0603 05/22/21 1729 05/23/21 0010  AST 19  --   --  18  ALT 11  --   --  11  ALKPHOS 81  --   --  75  BILITOT 0.7  --   --  0.4  PROT 7.5  --   --  6.7  ALBUMIN 3.6 3.1* 3.5 3.1*   No results for input(s): LIPASE, AMYLASE in the last 168 hours. CBC: Recent Labs  Lab 05/21/21 2002 05/22/21 0603 05/23/21 0010  WBC 5.0 4.3 4.4  NEUTROABS 3.2  --   --   HGB 9.0* 8.2* 8.1*  HCT 29.0* 26.5* 26.3*  MCV 95.1 97.1 95.6  PLT 170 133* 144*   Blood Culture    Component Value Date/Time   SDES  05/22/2021 1608     BLOOD RIGHT HAND Performed at [facility], [place] [address], [place], [place] [address]    SDES  05/22/2021 1608    BLOOD LEFT HAND Performed at [facility], [place] [address], [place], [place] [address]    [place]  05/22/2021 1608    BOTTLES DRAWN AEROBIC AND ANAEROBIC Blood Culture adequate volume Performed at [place] [address] [place], [place] [address]    [place]  05/22/2021 1608    BOTTLES DRAWN AEROBIC AND ANAEROBIC Blood Culture results may not be optimal due to an inadequate volume of blood received in culture bottles Performed at [facility], [place] [address], [place], [place] [address]    CULT  05/22/2021 1608    NO GROWTH < 12 HOURS Performed at [facility] [address], [place], [place] [address]    CULT  05/22/2021 1608    NO GROWTH < 12 HOURS Performed at [facility] [address] [place], [place] [address]    REPTSTATUS PENDING 05/22/2021 1608   REPTSTATUS PENDING 05/22/2021 1608    Cardiac Enzymes: Recent Labs  Lab 05/22/21 [phone]  CKTOTAL 70   CBG: Recent Labs  Lab 05/22/21 0914 05/22/21 1241 05/22/21 1728 05/22/21 2153 05/23/21 0633  GLUCAP 93 147* 153* 189* 232*   Iron Studies: No results for input(s): IRON, TIBC, TRANSFERRIN, FERRITIN in the last 72 hours. @lablastinr3 @ Studies/Results: US RENAL  Result Date: 05/22/2021 CLINICAL DATA:  AKI. EXAM: RENAL / URINARY TRACT ULTRASOUND COMPLETE COMPARISON:  04/28/2021. FINDINGS: Right Kidney: Renal measurements: 9.3 x 3.9 x 3.7 cm = volume: 71.0 mL. Echogenicity within normal limits. No mass or hydronephrosis visualized. Left Kidney: Renal measurements: 9.1 x 4.5 x 5.2 cm = volume: 110.1 mL. Echogenicity within normal limits. No mass or hydronephrosis visualized. Bladder: Appears normal for degree of bladder distention. Other: There are small bilateral pleural effusions. Ascites is noted in the  pelvis. Evaluation of the left kidney is limited due to patient's body habitus. IMPRESSION: 1. Normal evaluation of the kidneys and bladder. 2. Bilateral pleural effusions. 3. Ascites in the pelvis. Electronically Signed   By: Brett Fairy M.D.   On: 05/22/2021 01:02   DG CHEST PORT 1 VIEW  Result Date: 05/22/2021 CLINICAL DATA:  Shortness of breath, weakness. EXAM: PORTABLE CHEST 1 VIEW COMPARISON:  04/25/2021. FINDINGS: The heart enlarged and mediastinal contours are within normal limits. Atherosclerotic calcification of the aorta is noted. Hazy opacity is seen at the left lung base. The right lung is clear. No pneumothorax. IMPRESSION: 1. Hazy opacity at the left lung base, which may be artifactual due to patient's body habitus. A small pleural effusion with atelectasis or infiltrate can not be excluded. 2. Cardiomegaly. Electronically Signed   By: Brett Fairy M.D.   On: 05/22/2021 01:14   Medications:  cefTRIAXone (ROCEPHIN)  IV 1 g (05/22/21 1556)    heparin  5,000 Units Subcutaneous Q8H   insulin aspart  0-6 Units Subcutaneous TID WC   pravastatin  20 mg Oral Daily   sodium bicarbonate  650 mg Oral TID   sodium zirconium cyclosilicate  5 g Oral BID    Assessment/Recommendations: [name] is a/an 81 y.o. female with a past medical history CKD, anemia, HTN, DM2 and recent E coli bacteremia who present w/ AKI and volume overload    AKI on CKD4: Hard to definitively define baseline creatinine.  Seems to be worsening over the past year.  Likely most recent baseline is around 2.5-3.  Now with creatinine up to nearly 7.  renal US no obstruction. Urinalysis with concerns of ongoing urinary tract infection.  Also with signs of volume overload on exam.  Blood pressures borderline low here.   I think most likely she suffered ATN from NSAID use, ARB use, borderline hypotension. -She has no definitive need for renal replacement therapy at this time but certainly at risk in the next 24-72h.   Discussed today.  -No fluids or diuretics at this time.  Continue to monitor volume status closely -With holding ARB -Continue to monitor daily Cr, Dose meds for GFR -Monitor Daily I/Os, Daily weight  -Maintain MAP>65 for optimal renal perfusion.  -Avoid nephrotoxic medications including NSAIDs and Vanc/Zosyn combo -UTI treatment as below   Hyperkalemia: Associated with AKI. PRN lokelma.  K 5 this AM.   Metabolic Acidosis: Associated with AKI.  Increase po bicarb.    UTI/recent E. coli bacteremia: Urinalysis with ongoing concerns of UTI.  Will check urine culture as well as blood cultures given recent bacteremia.  Start ceftriaxone daily   Acute exacerbation of diastolic heart failure: Volume overload on exam with bilateral lower  extremity edema as well as some shortness of breath.  Given her AKI would hold diuretics at this time and restart once kidney function is improving or unless the patient develops hypoxia   Hypertension: Blood pressure borderline low at this time.  Hold home medications   Anemia: Unclear cause.  CKD likely playing minimal role.  Likely chronic inflammation.  Continue to monitor and transfuse as needed   DM2: Management per primary team  Jannifer Hick MD 05/23/2021, 8:15 AM  [facility] Pager: [phone]

## 2021-05-23 NOTE — Progress Notes (Signed)
[name]  LJQ:[id number] DOB: 05-20-1940 DOA: 05/21/2021 PCP: Trey Sailors, PA    Brief Narrative:  [phone] with a history of CKD stage IIIb, anemia of CKD, HTN, DM2, and admission December 2022 with sepsis related to E. coli UTI and bacteremia who was found to have a significantly elevated creatinine during a routine check by her PCP and was therefore directed to the ED.  The patient denied any specific symptoms.  In the ER the patient was found to have a creatinine of 6.7.  It had been 2.17-month previously, and 3.21-week previously.  Interim Hx: Patient feels good.  Denies shortness of breath.  Assessment & Plan:  Acute renal failure on CKD stage IIIb Renal ultrasound without evidence of acute hydronephrosis/obstruction No signs of uremia at this time Nephrology following No immediate needs for dialysis at this time UA indeterminate urine culture pending on Rocephin  Metabolic acidosis Add sodium bicarb - due to above   Hyperkalemia Initiate lokelma - appears patient may soon require HD Normalized  Bilateral pleural effusions and ascites Incidentally noted during ultrasound of abdomen  DM2 Sliding scale insulin  HTN Hydralazine as needed  Anemia of CKD Stable  Remote history of atrial fibrillation Not on chronic anticoagulation   Objective: Blood pressure 118/70, pulse 74, temperature 98 F (36.7 C), temperature source Oral, resp. rate 17, height 5\' 4"  (1.626 m), weight 82.6 kg, SpO2 96 %.  Intake/Output Summary (Last 24 hours) at 05/23/2021 1133 Last data filed at 05/23/2021 5883 Gross per 24 hour  Intake 360 ml  Output --  Net 360 ml   Filed Weights   05/21/21 1926  Weight: 82.6 kg    Examination: Pt seen for a f/u exam  CBC: Recent Labs  Lab 05/21/21 2002 05/22/21 0603 05/23/21 0010  WBC 5.0 4.3 4.4  NEUTROABS 3.2  --   --   HGB 9.0* 8.2* 8.1*  HCT 29.0* 26.5* 26.3*  MCV 95.1 97.1 95.6  PLT 170 133* 254*   Basic Metabolic  Panel: Recent Labs  Lab 05/21/21 2002 05/22/21 0603 05/22/21 1729 05/23/21 0010  NA 138 136 135 135  K 5.0 5.4* 5.2* 5.0  CL 98 108 104 106  CO2 17* 16* 17* 16*  GLUCOSE 108* 101* 163* 248*  BUN 50* 49* 54* 49*  CREATININE 6.77* 6.85*   6.99* 7.21* 7.16*  CALCIUM 8.7* 8.2* 8.4* 8.4*  MG 2.9*  --   --  2.5*  PHOS 5.5* 5.8* 6.3* 5.9*   GFR: Estimated Creatinine Clearance: 6.6 mL/min (A) (by C-G formula based on SCr of 7.16 mg/dL (H)).  Liver Function Tests: Recent Labs  Lab 05/21/21 2002 05/22/21 0603 05/22/21 1729 05/23/21 0010  AST 19  --   --  18  ALT 11  --   --  11  ALKPHOS 81  --   --  75  BILITOT 0.7  --   --  0.4  PROT 7.5  --   --  6.7  ALBUMIN 3.6 3.1* 3.5 3.1*    Cardiac Enzymes: Recent Labs  Lab 05/22/21 0603  CKTOTAL 70    HbA1C: Hgb A1c MFr Bld  Date/Time Value Ref Range Status  04/25/2021 04:13 AM 9.6 (H) 4.8 - 5.6 % Final    Comment:    (NOTE) Pre diabetes:          5.7%-6.4%  Diabetes:              >6.4%  Glycemic control for   <7.0% adults  with diabetes   09/06/2020 06:05 AM 10.4 (H) 4.8 - 5.6 % Final    Comment:    (NOTE) Pre diabetes:          5.7%-6.4%  Diabetes:              >6.4%  Glycemic control for   <7.0% adults with diabetes     CBG: Recent Labs  Lab 05/22/21 0914 05/22/21 1241 05/22/21 1728 05/22/21 2153 05/23/21 0633  GLUCAP 93 147* 153* 189* 232*    Recent Results (from the past 240 hour(s))  Resp Panel by RT-PCR (Flu A&B, Covid) Nasopharyngeal Swab     Status: None   Collection Time: 05/21/21 10:21 PM   Specimen: Nasopharyngeal Swab; Nasopharyngeal(NP) swabs in vial transport medium  Result Value Ref Range Status   SARS Coronavirus 2 by RT PCR NEGATIVE NEGATIVE Final    Comment: (NOTE) SARS-CoV-2 target nucleic acids are NOT DETECTED.  The SARS-CoV-2 RNA is generally detectable in upper respiratory specimens during the acute phase of infection. The lowest concentration of SARS-CoV-2 viral copies  this assay can detect is 138 copies/mL. A negative result does not preclude SARS-Cov-2 infection and should not be used as the sole basis for treatment or other patient management decisions. A negative result may occur with  improper specimen collection/handling, submission of specimen other than nasopharyngeal swab, presence of viral mutation(s) within the areas targeted by this assay, and inadequate number of viral copies(<138 copies/mL). A negative result must be combined with clinical observations, patient history, and epidemiological information. The expected result is Negative.  Fact Sheet for Patients:  EntrepreneurPulse.com.au  Fact Sheet for Healthcare Providers:  IncredibleEmployment.be  This test is no t yet approved or cleared by the [place] FDA and  has been authorized for detection and/or diagnosis of SARS-CoV-2 by FDA under an Emergency Use Authorization (EUA). This EUA will remain  in effect (meaning this test can be used) for the duration of the COVID-19 declaration under Section 564(b)(1) of the Act, 21 U.S.C.section 360bbb-3(b)(1), unless the authorization is terminated  or revoked sooner.       Influenza A by PCR NEGATIVE NEGATIVE Final   Influenza B by PCR NEGATIVE NEGATIVE Final    Comment: (NOTE) The Xpert Xpress SARS-CoV-2/FLU/RSV plus assay is intended as an aid in the diagnosis of influenza from Nasopharyngeal swab specimens and should not be used as a sole basis for treatment. Nasal washings and aspirates are unacceptable for Xpert Xpress SARS-CoV-2/FLU/RSV testing.  Fact Sheet for Patients: EntrepreneurPulse.com.au  Fact Sheet for Healthcare Providers: IncredibleEmployment.be  This test is not yet approved or cleared by the [place] FDA and has been authorized for detection and/or diagnosis of SARS-CoV-2 by FDA under an Emergency Use Authorization (EUA). This EUA  will remain in effect (meaning this test can be used) for the duration of the COVID-19 declaration under Section 564(b)(1) of the Act, 21 U.S.C. section 360bbb-3(b)(1), unless the authorization is terminated or revoked.  Performed at [facility], [place] [address] [place], [place] [address]   Culture, blood (routine x 2)     Status: None (Preliminary result)   Collection Time: 05/22/21  4:08 PM   Specimen: BLOOD RIGHT HAND  Result Value Ref Range Status   Specimen Description   Final    BLOOD RIGHT HAND Performed at [facility] [address], [place], [place] [address]    Special Requests   Final    BOTTLES DRAWN AEROBIC AND ANAEROBIC Blood Culture  adequate volume Performed at [facility] [address] [place], [place] [address]    Culture   Final    NO GROWTH < 12 HOURS Performed at [facility] [address] [place], [place] [address]    Report Status PENDING  Incomplete  Culture, blood (routine x 2)     Status: None (Preliminary result)   Collection Time: 05/22/21  4:08 PM   Specimen: BLOOD LEFT HAND  Result Value Ref Range Status   Specimen Description   Final    BLOOD LEFT HAND Performed at [facility] [address] [place], [place] [address]    Special Requests   Final    BOTTLES DRAWN AEROBIC AND ANAEROBIC Blood Culture results may not be optimal due to an inadequate volume of blood received in culture bottles Performed at [facility] [address] [place], [place] [address]    Culture   Final    NO GROWTH < 12 HOURS Performed at [facility] [address], [place], [place] [address]    Report Status PENDING  Incomplete     Scheduled Meds:  heparin  5,000 Units Subcutaneous Q8H   insulin aspart  0-6 Units Subcutaneous TID WC   pravastatin  20 mg Oral Daily   sodium bicarbonate  1,300 mg Oral TID   sodium zirconium cyclosilicate  5 g  Oral BID     LOS: 2 days      Patient ID: [name], female   DOB: 13-Mar-1941, 81 y.o.   MRN: [id number]

## 2021-05-23 NOTE — Progress Notes (Signed)
New Admission Note:    Arrival Method: ED strecher   Mental Orientation:   Alert, oriented X2 Telemetry: 5M19 Assessment: see flowsheet IV:   left hand Pain:   0/10 Tubes: Safety Measures: Safety Fall Prevention Plan has been given, discussed and signed Admission: Completed [facility] Orientation: Patient has been orientated to the room, unit and staff.  Family: Daughter contacted by patient        Orders have been reviewed and implemented. Will continue to monitor the patient. Call light has been placed within reach and bed alarm has been activated.

## 2021-05-24 LAB — GLUCOSE, CAPILLARY
Glucose-Capillary: 158 mg/dL — ABNORMAL HIGH (ref 70–99)
Glucose-Capillary: 161 mg/dL — ABNORMAL HIGH (ref 70–99)
Glucose-Capillary: 229 mg/dL — ABNORMAL HIGH (ref 70–99)
Glucose-Capillary: 263 mg/dL — ABNORMAL HIGH (ref 70–99)

## 2021-05-24 LAB — RENAL FUNCTION PANEL
Albumin: 2.8 g/dL — ABNORMAL LOW (ref 3.5–5.0)
Anion gap: 11 (ref 5–15)
BUN: 54 mg/dL — ABNORMAL HIGH (ref 8–23)
CO2: 20 mmol/L — ABNORMAL LOW (ref 22–32)
Calcium: 8.1 mg/dL — ABNORMAL LOW (ref 8.9–10.3)
Chloride: 108 mmol/L (ref 98–111)
Creatinine, Ser: 6.7 mg/dL — ABNORMAL HIGH (ref 0.44–1.00)
GFR, Estimated: 6 mL/min — ABNORMAL LOW (ref >60)
Glucose, Bld: 195 mg/dL — ABNORMAL HIGH (ref 70–99)
Phosphorus: 5.3 mg/dL — ABNORMAL HIGH (ref 2.5–4.6)
Potassium: 4.4 mmol/L (ref 3.5–5.1)
Sodium: 139 mmol/L (ref 135–145)

## 2021-05-24 NOTE — Progress Notes (Addendum)
[facility] Progress Note   Subjective:   Feels well.  No uremic symptoms.  No new issues.  UOP 539mL yesterday up from ~ 0 day prior. Cr 6.7 from 7.2.  Denies dyspnea, orthopnea.   Objective Vitals:   05/23/21 1642 05/23/21 2102 05/24/21 0511 05/24/21 0836  BP: (!) 153/99 132/76 (!) 120/55 132/71  Pulse: 86 84 82 87  Resp: 18 16 18 18   Temp: 97.9 F (36.6 C) 98 F (36.7 C) 98.2 F (36.8 C) 98.4 F (36.9 C)  TempSrc: Oral Oral    SpO2: 96% 96% 99% 98%  Weight:      Height:       Physical Exam General: well-appearing, no acute distress  HEENT: anicteric sclera, oropharynx clear without lesions CV: normal rate, no murmurs, trace pitting edema in BLE but also with thigh and sacral edema Lungs: clear to auscultation bilaterally, normal work of breathing Abd: soft, non-tender, non-distended Skin: no visible lesions or rashes Psych: alert, engaged, appropriate mood and affect Musculoskeletal: no obvious deformities Neuro: normal speech, no gross focal deficits   Additional Objective Labs: Basic Metabolic Panel: Recent Labs  Lab 05/22/21 1729 05/23/21 0010 05/24/21 0438  NA 135 135 139  K 5.2* 5.0 4.4  CL 104 106 108  CO2 17* 16* 20*  GLUCOSE 163* 248* 195*  BUN 54* 49* 54*  CREATININE 7.21* 7.16* 6.70*  CALCIUM 8.4* 8.4* 8.1*  PHOS 6.3* 5.9* 5.3*    Liver Function Tests: Recent Labs  Lab 05/21/21 2002 05/22/21 0603 05/22/21 1729 05/23/21 0010 05/24/21 0438  AST 19  --   --  18  --   ALT 11  --   --  11  --   ALKPHOS 81  --   --  75  --   BILITOT 0.7  --   --  0.4  --   PROT 7.5  --   --  6.7  --   ALBUMIN 3.6   < > 3.5 3.1* 2.8*   < > = values in this interval not displayed.    No results for input(s): LIPASE, AMYLASE in the last 168 hours. CBC: Recent Labs  Lab 05/21/21 2002 05/22/21 0603 05/23/21 0010  WBC 5.0 4.3 4.4  NEUTROABS 3.2  --   --   HGB 9.0* 8.2* 8.1*  HCT 29.0* 26.5* 26.3*  MCV 95.1 97.1 95.6  PLT 170 133* 144*     Blood Culture    Component Value Date/Time   SDES  05/22/2021 1608    BLOOD RIGHT HAND Performed at [facility], [place] [address], [place], [place] [address]    SDES  05/22/2021 1608    BLOOD LEFT HAND Performed at [facility], [place] [address], [place], [place] [address]    [place]  05/22/2021 1608    BOTTLES DRAWN AEROBIC AND ANAEROBIC Blood Culture adequate volume Performed at [facility] [address], [place], [place] [address]    [place]  05/22/2021 1608    BOTTLES DRAWN AEROBIC AND ANAEROBIC Blood Culture results may not be optimal due to an inadequate volume of blood received in culture bottles Performed at [facility], [place] [address], [place], [place] [address]    CULT  05/22/2021 1608    NO GROWTH 2 DAYS Performed at [facility], [place] [address] [place],  [address]    CULT  05/22/2021 1608    NO GROWTH 2 DAYS Performed at [facility], [address], [place], [place] [address]    REPTSTATUS PENDING 05/22/2021 1608   REPTSTATUS PENDING 05/22/2021 1608    Cardiac Enzymes: Recent Labs  Lab 05/22/21 0603  CKTOTAL 70    CBG: Recent Labs  Lab 05/23/21 0633 05/23/21 1135 05/23/21 1641 05/23/21 2102 05/24/21 0643  GLUCAP 232* 187* 247* 228* 158*    Iron Studies: No results for input(s): IRON, TIBC, TRANSFERRIN, FERRITIN in the last 72 hours. @lablastinr3 @ Studies/Results: No results found. Medications:  cefTRIAXone (ROCEPHIN)  IV 1 g (05/23/21 1547)    feeding supplement (NEPRO CARB STEADY)  237 mL Oral Q24H   heparin  5,000 Units Subcutaneous Q8H   insulin aspart  0-6 Units Subcutaneous TID WC   multivitamin  1 tablet Oral QHS   pravastatin  20 mg Oral Daily   sodium bicarbonate  1,300 mg Oral TID   sodium zirconium cyclosilicate  5 g Oral BID    Assessment/Recommendations: [name] is a/an 81 y.o. female with a past medical history  CKD, anemia, HTN, DM2 and recent E coli bacteremia who present w/ AKI and volume overload    AKI on CKD4: Hard to definitively define baseline creatinine.  Seems to be worsening over the past year.  Likely most recent baseline is around 2.5-3.  Presented with creatinine up to nearly 7.  renal US no obstruction. Urinalysis with concerns of ongoing urinary tract infection.  Also with signs of volume overload on exam.  Blood pressures borderline low here.  I think most likely she suffered ATN from NSAID use, ARB use, borderline hypotension. -She has no need for renal replacement therapy and hopefully we're seeing the start of ATN recovery phase with ^d UOP and improving Cr  -No fluids or diuretics at this time.  Continue to monitor volume status closely -With holding ARB -Continue to monitor daily Cr, Dose meds for GFR -Monitor Daily I/Os, Daily weight  -Maintain MAP>65 for optimal renal perfusion.  -Avoid nephrotoxic medications including NSAIDs  -UTI treatment as below   Hyperkalemia: Associated with AKI. PRN lokelma.  K 5 this AM.   Metabolic Acidosis: Associated with AKI.  Increased po bicarb.    UTI/recent E. coli bacteremia: Urinalysis with ongoing concerns of UTI.  Urine cx with GNR, speciation pending; blood cultures NGTD.  Cont ceftriaxone daily   Acute exacerbation of diastolic heart failure: Mildly volume overload on exam with bilateral lower extremity edema.  Holding diuretics for now, would give if worsening overload develops.   Hypertension: Blood pressure borderline low at this time.  Hold home medications   Anemia: Unclear cause.  CKD likely playing minimal role.  Likely chronic inflammation.  Continue to monitor and transfuse as needed   DM2: Management per primary team  Dispo: inpatient for now pending further improvement in renal function. Looks like she could benefit from PT/OT eval.   Will continue to follow. Page with concerns.   Jannifer Hick MD 05/24/2021, 10:33 AM   [facility] Pager: [phone]

## 2021-05-24 NOTE — Progress Notes (Signed)
[name]  ACZ:[id number] DOB: Jan 30, 1941 DOA: 05/21/2021 PCP: Trey Sailors, PA    Brief Narrative:  [phone] with a history of CKD stage IIIb, anemia of CKD, HTN, DM2, and admission December 2022 with sepsis related to E. coli UTI and bacteremia who was found to have a significantly elevated creatinine during a routine check by her PCP and was therefore directed to the ED.  The patient denied any specific symptoms.  In the ER the patient was found to have a creatinine of 6.7.  It had been 2.54-month previously, and 3.21-week previously.  Interim Hx: Continues to feel good.  Ambulates at home.  Has not gotten out of bed per patient report.  Assessment & Plan:  Acute renal failure on CKD stage IIIb Renal ultrasound without evidence of acute hydronephrosis/obstruction No signs of uremia at this time Nephrology following No immediate needs for dialysis at this time UA indeterminate urine culture pending on Rocephin, gram-negative rods prelim susceptibilities pending  Metabolic acidosis Add sodium bicarb - due to above   Hyperkalemia Initiate lokelma - appears patient may soon require HD Normalized  Bilateral pleural effusions and ascites Incidentally noted during ultrasound of abdomen  DM2 Sliding scale insulin  HTN Hydralazine as needed  Anemia of CKD Stable  Remote history of atrial fibrillation Not on chronic anticoagulation   Objective: Blood pressure 132/71, pulse 87, temperature 98.4 F (36.9 C), resp. rate 18, height 5\' 4"  (1.626 m), weight 82.6 kg, SpO2 98 %.  Intake/Output Summary (Last 24 hours) at 05/24/2021 1120 Last data filed at 05/24/2021 0900 Gross per 24 hour  Intake 440 ml  Output 750 ml  Net -310 ml   Filed Weights   05/21/21 1926  Weight: 82.6 kg    Examination: Physical Exam Constitutional:      Appearance: Normal appearance.  HENT:     Head: Normocephalic and atraumatic.     Nose: Nose normal.     Mouth/Throat:     Mouth:  Mucous membranes are moist.  Eyes:     Extraocular Movements: Extraocular movements intact.     Conjunctiva/sclera: Conjunctivae normal.     Pupils: Pupils are equal, round, and reactive to light.  Cardiovascular:     Rate and Rhythm: Normal rate and regular rhythm.     Pulses: Normal pulses.     Heart sounds: Normal heart sounds. No murmur heard.   No friction rub. No gallop.  Pulmonary:     Effort: Pulmonary effort is normal. No respiratory distress.     Breath sounds: Normal breath sounds. No wheezing or rhonchi.  Abdominal:     General: Abdomen is flat. There is no distension.     Palpations: Abdomen is soft.     Tenderness: There is no abdominal tenderness. There is no guarding or rebound.  Musculoskeletal:        General: No swelling, tenderness or deformity.     Cervical back: Normal range of motion. No rigidity.     Right lower leg: No edema.     Left lower leg: No edema.  Skin:    General: Skin is warm and dry.     Capillary Refill: Capillary refill takes 2 to 3 seconds.     Coloration: Skin is not jaundiced.     Findings: No rash.  Neurological:     General: No focal deficit present.     Mental Status: She is alert. Mental status is at baseline.     Cranial Nerves: No cranial nerve  deficit.     Motor: No weakness.  Psychiatric:        Mood and Affect: Mood normal.        Behavior: Behavior normal.        Thought Content: Thought content normal.        Judgment: Judgment normal.      CBC: Recent Labs  Lab 05/21/21 2002 05/22/21 0603 05/23/21 0010  WBC 5.0 4.3 4.4  NEUTROABS 3.2  --   --   HGB 9.0* 8.2* 8.1*  HCT 29.0* 26.5* 26.3*  MCV 95.1 97.1 95.6  PLT 170 133* 782*   Basic Metabolic Panel: Recent Labs  Lab 05/21/21 2002 05/22/21 0603 05/22/21 1729 05/23/21 0010 05/24/21 0438  NA 138   < > 135 135 139  K 5.0   < > 5.2* 5.0 4.4  CL 98   < > 104 106 108  CO2 17*   < > 17* 16* 20*  GLUCOSE 108*   < > 163* 248* 195*  BUN 50*   < > 54* 49* 54*   CREATININE 6.77*   < > 7.21* 7.16* 6.70*  CALCIUM 8.7*   < > 8.4* 8.4* 8.1*  MG 2.9*  --   --  2.5*  --   PHOS 5.5*   < > 6.3* 5.9* 5.3*   < > = values in this interval not displayed.   GFR: Estimated Creatinine Clearance: 7.1 mL/min (A) (by C-G formula based on SCr of 6.7 mg/dL (H)).  Liver Function Tests: Recent Labs  Lab 05/21/21 2002 05/22/21 0603 05/22/21 1729 05/23/21 0010 05/24/21 0438  AST 19  --   --  18  --   ALT 11  --   --  11  --   ALKPHOS 81  --   --  75  --   BILITOT 0.7  --   --  0.4  --   PROT 7.5  --   --  6.7  --   ALBUMIN 3.6 3.1* 3.5 3.1* 2.8*    Cardiac Enzymes: Recent Labs  Lab 05/22/21 0603  CKTOTAL 70    HbA1C: Hgb A1c MFr Bld  Date/Time Value Ref Range Status  04/25/2021 04:13 AM 9.6 (H) 4.8 - 5.6 % Final    Comment:    (NOTE) Pre diabetes:          5.7%-6.4%  Diabetes:              >6.4%  Glycemic control for   <7.0% adults with diabetes   09/06/2020 06:05 AM 10.4 (H) 4.8 - 5.6 % Final    Comment:    (NOTE) Pre diabetes:          5.7%-6.4%  Diabetes:              >6.4%  Glycemic control for   <7.0% adults with diabetes     CBG: Recent Labs  Lab 05/23/21 0633 05/23/21 1135 05/23/21 1641 05/23/21 2102 05/24/21 0643  GLUCAP 232* 187* 247* 228* 158*    Recent Results (from the past 240 hour(s))  Resp Panel by RT-PCR (Flu A&B, Covid) Nasopharyngeal Swab     Status: None   Collection Time: 05/21/21 10:21 PM   Specimen: Nasopharyngeal Swab; Nasopharyngeal(NP) swabs in vial transport medium  Result Value Ref Range Status   SARS Coronavirus 2 by RT PCR NEGATIVE NEGATIVE Final    Comment: (NOTE) SARS-CoV-2 target nucleic acids are NOT DETECTED.  The SARS-CoV-2 RNA is generally detectable in upper respiratory specimens  during the acute phase of infection. The lowest concentration of SARS-CoV-2 viral copies this assay can detect is 138 copies/mL. A negative result does not preclude SARS-Cov-2 infection and should not be  used as the sole basis for treatment or other patient management decisions. A negative result may occur with  improper specimen collection/handling, submission of specimen other than nasopharyngeal swab, presence of viral mutation(s) within the areas targeted by this assay, and inadequate number of viral copies(<138 copies/mL). A negative result must be combined with clinical observations, patient history, and epidemiological information. The expected result is Negative.  Fact Sheet for Patients:  EntrepreneurPulse.com.au  Fact Sheet for Healthcare Providers:  IncredibleEmployment.be  This test is no t yet approved or cleared by the [place] FDA and  has been authorized for detection and/or diagnosis of SARS-CoV-2 by FDA under an Emergency Use Authorization (EUA). This EUA will remain  in effect (meaning this test can be used) for the duration of the COVID-19 declaration under Section 564(b)(1) of the Act, 21 U.S.C.section 360bbb-3(b)(1), unless the authorization is terminated  or revoked sooner.       Influenza A by PCR NEGATIVE NEGATIVE Final   Influenza B by PCR NEGATIVE NEGATIVE Final    Comment: (NOTE) The Xpert Xpress SARS-CoV-2/FLU/RSV plus assay is intended as an aid in the diagnosis of influenza from Nasopharyngeal swab specimens and should not be used as a sole basis for treatment. Nasal washings and aspirates are unacceptable for Xpert Xpress SARS-CoV-2/FLU/RSV testing.  Fact Sheet for Patients: EntrepreneurPulse.com.au  Fact Sheet for Healthcare Providers: IncredibleEmployment.be  This test is not yet approved or cleared by the [place] FDA and has been authorized for detection and/or diagnosis of SARS-CoV-2 by FDA under an Emergency Use Authorization (EUA). This EUA will remain in effect (meaning this test can be used) for the duration of the COVID-19 declaration under Section  564(b)(1) of the Act, 21 U.S.C. section 360bbb-3(b)(1), unless the authorization is terminated or revoked.  Performed at [facility], [place] [address], [place], [place] [address]   Urine Culture     Status: Abnormal (Preliminary result)   Collection Time: 05/22/21  6:03 AM   Specimen: Urine, Clean Catch  Result Value Ref Range Status   Specimen Description   Final    URINE, CLEAN CATCH Performed at [facility], [place] [address], [place], [place] [address]    Special Requests   Final    NONE Performed at [facility], [place] [address] [place], [place] [address]    Culture (A)  Final    80,000 COLONIES/mL GRAM NEGATIVE RODS CULTURE REINCUBATED FOR BETTER GROWTH SUSCEPTIBILITIES TO FOLLOW Performed at [facility], [place] [address], [place], [place] [address]    Report Status PENDING  Incomplete  Culture, blood (routine x 2)     Status: None (Preliminary result)   Collection Time: 05/22/21  4:08 PM   Specimen: BLOOD RIGHT HAND  Result Value Ref Range Status   Specimen Description   Final    BLOOD RIGHT HAND Performed at [facility] [address], [place], [place] [address]    Special Requests   Final    BOTTLES DRAWN AEROBIC AND ANAEROBIC Blood Culture adequate volume Performed at [facility] [address] [place], [place] [address]    Culture   Final    NO GROWTH 2 DAYS Performed at [facility] [address], [place], [place] [address]  Report Status PENDING  Incomplete  Culture, blood (routine x 2)     Status: None (Preliminary result)   Collection Time: 05/22/21  4:08 PM   Specimen: BLOOD LEFT HAND  Result Value Ref Range Status   Specimen Description   Final    BLOOD LEFT HAND Performed at [facility] [address] [place], [place] [address]    Special Requests   Final    BOTTLES DRAWN AEROBIC AND ANAEROBIC Blood Culture results  may not be optimal due to an inadequate volume of blood received in culture bottles Performed at [facility] [address] [place], [place] [address]    Culture   Final    NO GROWTH 2 DAYS Performed at [facility] [address] [place], [place] [address]    Report Status PENDING  Incomplete     Scheduled Meds:  feeding supplement (NEPRO CARB STEADY)  237 mL Oral Q24H   heparin  5,000 Units Subcutaneous Q8H   insulin aspart  0-6 Units Subcutaneous TID WC   multivitamin  1 tablet Oral QHS   pravastatin  20 mg Oral Daily   sodium bicarbonate  1,300 mg Oral TID   sodium zirconium cyclosilicate  5 g Oral BID     LOS: 3 days    Patient ID: Tayjah Lobdell, female   DOB: 1940-07-28, 81 y.o.   MRN: [id number]

## 2021-05-25 LAB — RENAL FUNCTION PANEL
Albumin: 2.7 g/dL — ABNORMAL LOW (ref 3.5–5.0)
Anion gap: 9 (ref 5–15)
BUN: 52 mg/dL — ABNORMAL HIGH (ref 8–23)
CO2: 20 mmol/L — ABNORMAL LOW (ref 22–32)
Calcium: 8 mg/dL — ABNORMAL LOW (ref 8.9–10.3)
Chloride: 109 mmol/L (ref 98–111)
Creatinine, Ser: 5.84 mg/dL — ABNORMAL HIGH (ref 0.44–1.00)
GFR, Estimated: 7 mL/min — ABNORMAL LOW (ref >60)
Glucose, Bld: 207 mg/dL — ABNORMAL HIGH (ref 70–99)
Phosphorus: 5 mg/dL — ABNORMAL HIGH (ref 2.5–4.6)
Potassium: 4 mmol/L (ref 3.5–5.1)
Sodium: 138 mmol/L (ref 135–145)

## 2021-05-25 LAB — URINE CULTURE: Culture: 70000 — AB

## 2021-05-25 LAB — GLUCOSE, CAPILLARY
Glucose-Capillary: 195 mg/dL — ABNORMAL HIGH (ref 70–99)
Glucose-Capillary: 223 mg/dL — ABNORMAL HIGH (ref 70–99)
Glucose-Capillary: 203 mg/dL — ABNORMAL HIGH (ref 70–99)
Glucose-Capillary: 144 mg/dL — ABNORMAL HIGH (ref 70–99)

## 2021-05-25 MED ORDER — SODIUM CHLORIDE 0.9 % IV SOLN
1.0000 g | INTRAVENOUS | Status: DC
  Filled 2021-05-25: qty 1

## 2021-05-25 MED ORDER — LEVOFLOXACIN 500 MG PO TABS
500.0000 mg | ORAL_TABLET | ORAL | Status: DC
  Administered 2021-05-27: 500 mg via ORAL
  Filled 2021-05-25: qty 1

## 2021-05-25 MED ORDER — LEVOFLOXACIN 500 MG PO TABS
750.0000 mg | ORAL_TABLET | Freq: Once | ORAL | Status: AC
  Administered 2021-05-25: 750 mg via ORAL
  Filled 2021-05-25: qty 2

## 2021-05-25 MED ORDER — SORBITOL 70 % SOLN
30.0000 mL | Freq: Every day | Status: DC | PRN
  Administered 2021-05-25: 30 mL via ORAL
  Filled 2021-05-25: qty 30

## 2021-05-25 MED ORDER — AMPICILLIN SODIUM 1 G IJ SOLR: 1.0000 g | Freq: Four times a day (QID) | INTRAMUSCULAR | Status: DC

## 2021-05-25 NOTE — Progress Notes (Addendum)
Inpatient Diabetes Program Recommendations  AACE/ADA: New Consensus Statement on Inpatient Glycemic Control (2015)  Target Ranges:  Prepandial:   less than 140 mg/dL      Peak postprandial:   less than 180 mg/dL (1-2 hours)      Critically ill patients:  140 - 180 mg/dL   Lab Results  Component Value Date   GLUCAP 195 (H) 05/25/2021   HGBA1C 9.6 (H) 04/25/2021    Review of Glycemic Control  Latest Reference Range & Units 05/24/21 06:43 05/24/21 11:52 05/24/21 17:02 05/24/21 22:17 05/25/21 08:22  Glucose-Capillary 70 - 99 mg/dL 158 (H) 161 (H) 229 (H) 263 (H) 195 (H)  (H): Data is abnormally high  Diabetes history: DM2 Outpatient Diabetes medications: 70/30-15 units QD Current orders for Inpatient glycemic control: Novolog 0-6 units TID  Inpatient Diabetes Program Recommendations:    Semglee 6 units QD (50% of home basal)  Addendum@1455 : Spoke with patient at bedside.  Reviewed patient's current A1c of 9.6% (average blood sugar of 229 mg/dL). Explained what a A1c is and what it measures. Also reviewed goal A1c with patient, importance of good glucose control @ home, and blood sugar goals.  She checks her BS every evening.  She states her BS is usually 140's.  She takes Humulin 70/30 15 units QHS.  Current with PCP.  Denies low blood sugars and knows how to treat.    She does not drink beverages with sugar.  She drinks mainly water.  Encouraged her to watch portion sizes and limit sweets and CHO's.     Will continue to follow while inpatient.  Thank you, Reche Dixon, MSN, RN Diabetes Coordinator Inpatient Diabetes Program [phone] (team pager from 8a-5p)

## 2021-05-25 NOTE — TOC Initial Note (Signed)
Transition of Care [facility]) - Initial/Assessment Note    Patient Details  Name: [name] MRN: [id number] Date of Birth: Oct 31, 1940  [facility] CM/SW Contact:    Tom-Johnson, Renea Ee, RN Phone Number: 05/25/2021, 1:06 PM  Clinical Narrative:                 CM spoke with patient at bedside About needs for post hospital transition. Patient's daughter, Lynelle Smoke at bedside as well. Patient states she lives with [place]. Has five supporting adult children. Retired from the [place] of [place]. Has a cane, walker, shower chair at home. [name] requesting a hospital bed. CM told her PT/OT has to eval patient and put in recommendations. MD notified of request. PCP is Trey Sailors, PA and uses [facility] on [address]. CM awaits PT/OT eval for disposition and needs. Daughter to transport at discharge. CM will continue to follow with needs.    Barriers to Discharge: Continued Medical Work up   Patient Goals and CMS Choice Patient states their goals for this hospitalization and ongoing recovery are:: To return home CMS Medicare.gov Compare Post Acute Care list provided to:: Patient Choice offered to / list presented to : Patient, Adult Children (Daughter, [name])  Expected Discharge Plan and Services     Discharge Planning Services: CM Consult   Living arrangements for the past 2 months: Single Family Home                                      Prior Living Arrangements/Services Living arrangements for the past 2 months: Single Family Home Lives with:: Adult Children (Daughter) Patient language and need for interpreter reviewed:: Yes Do you feel safe going back to the place where you live?: Yes      Need for Family Participation in Patient Care: Yes (Comment) Care giver support system in place?: Yes (comment) Current home services: DME (Walker, shower chair, cane.) Criminal Activity/Legal Involvement Pertinent to Current Situation/Hospitalization: No -  Comment as needed  Activities of Daily Living Home Assistive Devices/Equipment: Environmental consultant (specify type), Dentures (specify type) (full dentures) ADL Screening (condition at time of admission) Patient's cognitive ability adequate to safely complete daily activities?: No Is the patient deaf or have difficulty hearing?: No Does the patient have difficulty seeing, even when wearing glasses/contacts?: No Does the patient have difficulty concentrating, remembering, or making decisions?: No Patient able to express need for assistance with ADLs?: Yes Does the patient have difficulty dressing or bathing?: Yes Independently performs ADLs?: No Communication: Independent Dressing (OT): Independent Grooming: Independent Feeding: Independent Bathing: Independent Toileting: Needs assistance Is this a change from baseline?: Pre-admission baseline In/Out Bed: Needs assistance Is this a change from baseline?: Pre-admission baseline Walks in Home: Needs assistance Is this a change from baseline?: Pre-admission baseline Does the patient have difficulty walking or climbing stairs?: Yes Weakness of Legs: Both Weakness of Arms/Hands: Both  Permission Sought/Granted Permission sought to share information with : Case Manager, Family Supports Permission granted to share information with : Yes, Verbal Permission Granted              Emotional Assessment Appearance:: Appears stated age Attitude/Demeanor/Rapport: Engaged, Gracious Affect (typically observed): Accepting, Appropriate, Calm, Hopeful Orientation: : Oriented to Self, Oriented to Place, Oriented to  Time, Oriented to Situation Alcohol / Substance Use: Not Applicable Psych Involvement: No (comment)  Admission diagnosis:  ARF (acute renal failure) ([place]) [N17.9]  SOB (shortness of breath) [R06.02] Acute renal failure, unspecified acute renal failure type ([place]) [N17.9] Patient Active Problem List   Diagnosis Date Noted   CKD (chronic kidney  disease), stage V ([place]) 05/22/2021   ARF (acute renal failure) ([place]) 05/21/2021   Malnutrition of moderate degree 04/27/2021   Confusion    Wheezing    Bacteremia 04/25/2021   Sepsis with acute organ dysfunction without septic shock ([place]) 04/24/2021   Acute kidney injury superimposed on chronic kidney disease ([place]) 04/24/2021   Acute cystitis without hematuria 04/24/2021   Persistent atrial fibrillation ([place]) 09/06/2020   DM (diabetes mellitus), type 2 with renal complications ([place]) 30/16/0109   Prolonged QT interval 09/06/2020   Abnormal gait 06/18/2020   Heart failure ([place]) 06/18/2020   Hyperglycemia due to type 2 diabetes mellitus ([place]) 06/18/2020   Hypertensive heart disease without congestive heart failure 06/18/2020   Mixed incontinence 06/18/2020   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[place]) 06/18/2020   Pure hypercholesterolemia 06/18/2020   Stage 3 chronic kidney disease ([place]) 06/18/2020   Type 2 diabetes mellitus without complications ([place]) 32/35/5732   Vitamin D deficiency 06/18/2020   Essential hypertension 05/08/2019   Hyperlipidemia 05/08/2019   Bilateral lower extremity edema 05/08/2019   Failed hearing screening 02/02/2018   Impacted cerumen of right ear 02/02/2018   Otalgia, right 02/02/2018   PCP:  Trey Sailors, PA Pharmacy:   [facility] [place], [place] [address] AT [facility] [address] [place] [address] Phone: [phone] Fax: [phone]     Social Determinants of Health (SDOH) Interventions    Readmission Risk Interventions No flowsheet data found.

## 2021-05-25 NOTE — Evaluation (Signed)
Physical Therapy Evaluation Patient Details Name: [name] MRN: [id number] DOB: 1940/08/23 Today's Date: 05/25/2021  History of Present Illness  [name] is a 81 y.o. female presented to ED from PCP office with elevated creatinine and found to have acute renal failure and volume overload. Also noted to have bilateral pleural effusions and ascites. PMHx: CKD stage III, anemia, HTN DM2 admitted last month for sepsis from UTI with E. coli bacteremia.  Clinical Impression  Patient presents with generalized weakness, impaired balance, decreased activity tolerance and impaired mobility s/p above. Pt lives at home with her daughter and reports being independent for ambulation/ADLs at baseline. Today, pt requires min A for bed mobility and Mod A for standing; tolerated short distance ambulation with Min A for balance/safety with use of RW for support. Requires seated rest break due to weakness/fatigue. Pt reports her daughter can provide initial 24/7 supervision/assist at home until pt's strength improves. Encouraged OOB to chiar for each meal and to use bathroom. Would also benefit from HHPT. Will follow acutely to maximize independence and mobility prior to return home.      Recommendations for follow up therapy are one component of a multi-disciplinary discharge planning process, led by the attending physician.  Recommendations may be updated based on patient status, additional functional criteria and insurance authorization.  Follow Up Recommendations Home health PT    Assistance Recommended at Discharge Frequent or constant Supervision/Assistance  Patient can return home with the following  A little help with walking and/or transfers;A little help with bathing/dressing/bathroom;Help with stairs or ramp for entrance;Assist for transportation;Assistance with cooking/housework    Equipment Recommendations None recommended by PT  Recommendations for Other Services       Functional Status  Assessment Patient has had a recent decline in their functional status and demonstrates the ability to make significant improvements in function in a reasonable and predictable amount of time.     Precautions / Restrictions Precautions Precautions: Fall Restrictions Weight Bearing Restrictions: No      Mobility  Bed Mobility Overal bed mobility: Needs Assistance Bed Mobility: Supine to Sit     Supine to sit: HOB elevated, Min assist     General bed mobility comments: Use of rail; increased time. MinA with trunk.    Transfers Overall transfer level: Needs assistance Equipment used: Rolling walker (2 wheels) Transfers: Sit to/from Stand Sit to Stand: Mod assist           General transfer comment: ASsist to power to standing with cues for hand placement as pt wanting to pull up on RW. Stood from [employer], from [facility] x1, transferred to chair post ambulation.    Ambulation/Gait Ambulation/Gait assistance: Min assist Gait Distance (Feet): 8 Feet (+12') Assistive device: Rolling walker (2 wheels) Gait Pattern/deviations: Step-through pattern, Wide base of support, Decreased stride length, Decreased step length - right, Decreased step length - left, Trunk flexed Gait velocity: decreased     General Gait Details: Slow, mildly unsteady gait wtih assist for RW management/balance. 1 seated rest break due to fatigue. 2/4 DOE.  Stairs            Wheelchair Mobility    Modified Rankin (Stroke Patients Only)       Balance Overall balance assessment: Needs assistance Sitting-balance support: Feet supported, No upper extremity supported Sitting balance-Leahy Scale: Fair Sitting balance - Comments: posterior bias, limited sitting tolerance due to fatigue without UE support   Standing balance support: During functional activity, Reliant on assistive device for balance  Standing balance-Leahy Scale: Poor Standing balance comment: Min guard for static balance and Min A for  walking                             Pertinent Vitals/Pain Pain Assessment Pain Assessment: No/denies pain    Home Living Family/patient expects to be discharged to:: Private residence Living Arrangements: Children (daughter) Available Help at Discharge: Family;Available PRN/intermittently Type of Home: House Home Access: Stairs to enter Entrance Stairs-Rails: Can reach both Entrance Stairs-Number of Steps: 3   Home Layout: Able to live on main level with bedroom/bathroom;Two level Home Equipment: Rollator (4 wheels);BSC/3in1;Shower seat Additional Comments: uses hand rails only if feeling more tired.    Prior Function Prior Level of Function : Independent/Modified Independent                     Hand Dominance   Dominant Hand: Right    Extremity/Trunk Assessment   Upper Extremity Assessment Upper Extremity Assessment: Generalized weakness;Defer to OT evaluation    Lower Extremity Assessment Lower Extremity Assessment: Generalized weakness    Cervical / Trunk Assessment Cervical / Trunk Assessment: Normal  Communication   Communication: No difficulties  Cognition Arousal/Alertness: Awake/alert Behavior During Therapy: WFL for tasks assessed/performed Overall Cognitive Status: No family/caregiver present to determine baseline cognitive functioning                                 General Comments: States she got confused and that is why she is in the hospital. Follows commands well, answers questions appropriately.        General Comments      Exercises     Assessment/Plan    PT Assessment Patient needs continued PT services  PT Problem List Decreased strength;Decreased mobility;Decreased safety awareness;Decreased activity tolerance;Decreased knowledge of precautions;Decreased balance;Decreased knowledge of use of DME;Pain;Cardiopulmonary status limiting activity       PT Treatment Interventions DME instruction;Therapeutic  activities;Gait training;Therapeutic exercise;Patient/family education;Functional mobility training;Balance training;Stair training    PT Goals (Current goals can be found in the Care Plan section)  Acute Rehab PT Goals Patient Stated Goal: to go home PT Goal Formulation: With patient Time For Goal Achievement: 06/08/21 Potential to Achieve Goals: Good    Frequency Min 3X/week     Co-evaluation               AM-PAC PT "6 Clicks" Mobility  Outcome Measure Help needed turning from your back to your side while in a flat bed without using bedrails?: A Little Help needed moving from lying on your back to sitting on the side of a flat bed without using bedrails?: A Little Help needed moving to and from a bed to a chair (including a wheelchair)?: A Lot Help needed standing up from a chair using your arms (e.g., wheelchair or bedside chair)?: A Lot Help needed to walk in hospital room?: A Little Help needed climbing 3-5 steps with a railing? : A Little 6 Click Score: 16    End of Session Equipment Utilized During Treatment: Gait belt Activity Tolerance: Patient tolerated treatment well;Patient limited by fatigue Patient left: in chair;with call bell/phone within reach Nurse Communication: Mobility status PT Visit Diagnosis: Unsteadiness on feet (R26.81);Muscle weakness (generalized) (M62.81)    Time: 1884-1660 PT Time Calculation (min) (ACUTE ONLY): 19 min   Charges:   PT Evaluation $PT Eval Moderate Complexity: 1  Mod          Zettie Cooley, DPT Acute Rehabilitation Services Pager [phone] Office [place] 05/25/2021, 4:11 PM

## 2021-05-25 NOTE — Evaluation (Signed)
Occupational Therapy Evaluation Patient Details Name: [name] MRN: [id number] DOB: 1940/11/12 Today's Date: 05/25/2021   History of Present Illness [name] is a 81 y.o. female presented to ED from PCP office with elevated creatinine and found to have acute renal failure and volume overload. Also noted to have bilateral pleural effusions and ascites. PMHx: CKD stage III, anemia, HTN DM2 admitted last month for sepsis from UTI with E. coli bacteremia.   Clinical Impression   This 81 yo female admitted with above presents to acute OT with PLOF of being able to do her own basic ADLs by herself, do her own laundry, and fix simple food.Currently pt is setup/S-total A for basic ADLs. Today she is also having an issue with uncontrolled loose stools. She will continue to benefit from acute OT with follow up [place].      Recommendations for follow up therapy are one component of a multi-disciplinary discharge planning process, led by the attending physician.  Recommendations may be updated based on patient status, additional functional criteria and insurance authorization.   Follow Up Recommendations  Home health OT    Assistance Recommended at Discharge Frequent or constant Supervision/Assistance  Patient can return home with the following A little help with walking and/or transfers;A little help with bathing/dressing/bathroom;Assistance with cooking/housework    Functional Status Assessment  Patient has had a recent decline in their functional status and demonstrates the ability to make significant improvements in function in a reasonable and predictable amount of time.  Equipment Recommendations  None recommended by OT       Precautions / Restrictions Precautions Precautions: Fall Restrictions Weight Bearing Restrictions: No      Mobility Bed Mobility               General bed mobility comments: Pt up in recliner upon arrival    Transfers Overall transfer level: Needs  assistance Equipment used: Rolling walker (2 wheels) Transfers: Sit to/from Stand Sit to Stand: Min assist                  Balance Overall balance assessment: Needs assistance Sitting-balance support: No upper extremity supported, Feet supported Sitting balance-Leahy Scale: Fair     Standing balance support: During functional activity, Reliant on assistive device for balance Standing balance-Leahy Scale: Poor                             ADL either performed or assessed with clinical judgement   ADL Overall ADL's : Needs assistance/impaired Eating/Feeding: Independent;Sitting   Grooming: Set up;Sitting   Upper Body Bathing: Set up;Sitting   Lower Body Bathing: Minimal assistance;Sit to/from stand   Upper Body Dressing : Set up;Sitting   Lower Body Dressing: Minimal assistance;Sit to/from stand   Toilet Transfer: Minimal assistance Armed forces technical officer Details (indicate cue type and reason): stood from recliner x2 to get cleaned up from runny stool, vc's for safe hand placement Toileting- Clothing Manipulation and Hygiene: Total assistance Toileting - Clothing Manipulation Details (indicate cue type and reason): min A sit<>stand             Vision Patient Visual Report: No change from baseline              Pertinent Vitals/Pain Pain Assessment Pain Assessment: No/denies pain     Hand Dominance Right   Extremity/Trunk Assessment Upper Extremity Assessment Upper Extremity Assessment: Generalized weakness;Defer to OT evaluation RUE Deficits / Details: patient was able to lift  UE about 30 degrees of flexion with reports of pain in shoulder. patient reported having fallen 4 years prior with this being result      Communication Communication Communication: No difficulties   Cognition Arousal/Alertness: Awake/alert Behavior During Therapy: WFL for tasks assessed/performed Overall Cognitive Status: No family/caregiver present to determine baseline  cognitive functioning                                 General Comments: Follows all one step commands                Home Living Family/patient expects to be discharged to:: Private residence Living Arrangements: Children (daughter) Available Help at Discharge: Family;Available PRN/intermittently Type of Home: House Home Access: Stairs to enter [employer] of Steps: 3 Entrance Stairs-Rails: Can reach both Home Layout: Able to live on main level with bedroom/bathroom;Two level     Bathroom Shower/Tub: Chief Strategy Officer: Rollator (4 wheels);BSC/3in1;Shower seat   Additional Comments: uses hand rails only if feeling more tired.      Prior Functioning/Environment Prior Level of Function : Independent/Modified Independent                        OT Problem List: Decreased strength;Decreased range of motion;Impaired balance (sitting and/or standing)      OT Treatment/Interventions: Self-care/ADL training;DME and/or AE instruction;Patient/family education;Balance training    OT Goals(Current goals can be found in the care plan section) Acute Rehab OT Goals Patient Stated Goal: to feel better and go home OT Goal Formulation: With patient Time For Goal Achievement: 06/08/21 Potential to Achieve Goals: Good  OT Frequency: Min 2X/week       AM-PAC OT "6 Clicks" Daily Activity     Outcome Measure Help from another person eating meals?: None Help from another person taking care of personal grooming?: A Little Help from another person toileting, which includes using toliet, bedpan, or urinal?: A Lot Help from another person bathing (including washing, rinsing, drying)?: A Lot Help from another person to put on and taking off regular upper body clothing?: A Little Help from another person to put on and taking off regular lower body clothing?: A Lot 6 Click Score: 16   End of Session Equipment Utilized During Treatment:  Rolling walker (2 wheels) Nurse Communication:  (NT: pt incontient of runny stool (NT reports this was the 3rd time in less than an hour))  Activity Tolerance: Patient tolerated treatment well Patient left: in chair;with call bell/phone within reach  OT Visit Diagnosis: Unsteadiness on feet (R26.81);Muscle weakness (generalized) (M62.81)                Time: 1660-6301 OT Time Calculation (min): 23 min Charges:  OT General Charges $OT Visit: 1 Visit OT Evaluation $OT Eval Moderate Complexity: 1 Mod OT Treatments $Self Care/Home Management : 8-22 mins  Golden Circle, OTR/L Acute [employer] Pager [phone] Office [phone]    Almon Register 05/25/2021, 5:45 PM

## 2021-05-25 NOTE — Progress Notes (Signed)
PROGRESS NOTE    [name]  NWG:[id number] DOB: [birth year] DOA: 05/21/2021 PCP: Trey Sailors, PA   Brief Narrative: 81 year old with past medical history significant for CKD stage IIIb, anemia, CKD, hypertension, diabetes type 2 recent admission December 2022 with sepsis related to E. coli UTI and bacteremia was found to have significantly elevated creatinine during her routine check by her PCP and was therefore directed to the ED.  In the ER patient was found to have a creatinine of 6.7.  Prior creatinine 2.4 months ago, 3.2 a week prior.     Assessment & Plan:   Principal Problem:   ARF (acute renal failure) (HCC) Active Problems:   Essential hypertension   DM (diabetes mellitus), type 2 with renal complications (HCC)   CKD (chronic kidney disease), stage V ([place])  1-AKI on CKD stage IIIb: -Renal ultrasound negative for hydronephrosis -Recent cr 2.5--3 -Secondary to ATN from NSAID use, ARB use, borderline hypotension -Nephrology following -Treating for UTI as well, urine culture growing Pseudomonas and Enterococcus, antibiotic changed to Levaquin to cover both -Creatinine trending down. 5.8 -Continue to hold ARB  2-Metabolic Acidosis: Continue with sodium bicarb  Hyperkalemia: Treated with Lokelma K at 4.   Bilateral pleural effusion ascites Holding Diuretic due to AKI  Diabetes type 2: Continue with a sliding scale insulin  Hypertension: hydralazine as needed  Anemia of CKD: Stable  Remote history of A. fib: Not on anticoagulation        Nutrition Problem: Increased nutrient needs Etiology: acute illness    Signs/Symptoms: estimated needs    Interventions: MVI, Nepro shake  Estimated body mass index is 31.24 kg/m as calculated from the following:   Height as of this encounter: 5\' 4"  (1.626 m).   Weight as of this encounter: 82.6 kg.   DVT prophylaxis: Heparin Code Status: Full code Family Communication: Discussed with daughter who was  at bedside Disposition Plan:  Status is: Inpatient  Remains inpatient appropriate because: management of AKI and UTI. PT OT eval        Consultants:  Nephrology   Procedures:  [place]  Antimicrobials:    Subjective: She reported feeling okay, just had a bowel movement.  She received some sorbitol this morning for constipation.   Objective: Vitals:   05/24/21 1703 05/24/21 2112 05/25/21 0526 05/25/21 1017  BP: (!) 122/58 136/68 122/63 127/71  Pulse: 84 85 73 87  Resp: 17 16  17   Temp: 98 F (36.7 C) 99.6 F (37.6 C) 98.4 F (36.9 C) 99.5 F (37.5 C)  TempSrc:  Oral Oral Oral  SpO2: 97% 97% 97% 96%  Weight:      Height:        Intake/Output Summary (Last 24 hours) at 05/25/2021 1553 Last data filed at 05/25/2021 0900 Gross per 24 hour  Intake 760 ml  Output 800 ml  Net -40 ml   Filed Weights   05/21/21 1926  Weight: 82.6 kg    Examination:  General exam: Appears calm and comfortable  Respiratory system: Clear to auscultation. Respiratory effort normal. Cardiovascular system: S1 & S2 heard, RRR. No JVD, murmurs, rubs, gallops or clicks. No pedal edema. Gastrointestinal system: Abdomen is nondistended, soft and nontender. No organomegaly or masses felt. Normal bowel sounds heard. Central nervous system: Alert and oriented.  Extremities: Symmetric 5 x 5 power.    Data Reviewed: I have personally reviewed following labs and imaging studies  CBC: Recent Labs  Lab 05/21/21 2002 05/22/21 0603 05/23/21 0010  WBC 5.0  4.3 4.4  NEUTROABS 3.2  --   --   HGB 9.0* 8.2* 8.1*  HCT 29.0* 26.5* 26.3*  MCV 95.1 97.1 95.6  PLT 170 133* 132*   Basic Metabolic Panel: Recent Labs  Lab 05/21/21 2002 05/22/21 0603 05/22/21 1729 05/23/21 0010 05/24/21 0438 05/25/21 0228  NA 138 136 135 135 139 138  K 5.0 5.4* 5.2* 5.0 4.4 4.0  CL 98 108 104 106 108 109  CO2 17* 16* 17* 16* 20* 20*  GLUCOSE 108* 101* 163* 248* 195* 207*  BUN 50* 49* 54* 49* 54* 52*   CREATININE 6.77* 6.85*   6.99* 7.21* 7.16* 6.70* 5.84*  CALCIUM 8.7* 8.2* 8.4* 8.4* 8.1* 8.0*  MG 2.9*  --   --  2.5*  --   --   PHOS 5.5* 5.8* 6.3* 5.9* 5.3* 5.0*   GFR: Estimated Creatinine Clearance: 8.1 mL/min (A) (by C-G formula based on SCr of 5.84 mg/dL (H)). Liver Function Tests: Recent Labs  Lab 05/21/21 2002 05/22/21 0603 05/22/21 1729 05/23/21 0010 05/24/21 0438 05/25/21 0228  AST 19  --   --  18  --   --   ALT 11  --   --  11  --   --   ALKPHOS 81  --   --  75  --   --   BILITOT 0.7  --   --  0.4  --   --   PROT 7.5  --   --  6.7  --   --   ALBUMIN 3.6 3.1* 3.5 3.1* 2.8* 2.7*   No results for input(s): LIPASE, AMYLASE in the last 168 hours. No results for input(s): AMMONIA in the last 168 hours. Coagulation Profile: No results for input(s): INR, PROTIME in the last 168 hours. Cardiac Enzymes: Recent Labs  Lab 05/22/21 0603  CKTOTAL 70   BNP (last 3 results) Recent Labs    05/12/21 1311 05/20/21 1159  PROBNP 6,865* 10,030*   HbA1C: No results for input(s): HGBA1C in the last 72 hours. CBG: Recent Labs  Lab 05/24/21 1152 05/24/21 1702 05/24/21 2217 05/25/21 0822 05/25/21 1246  GLUCAP 161* 229* 263* 195* 223*   Lipid Profile: No results for input(s): CHOL, HDL, LDLCALC, TRIG, CHOLHDL, LDLDIRECT in the last 72 hours. Thyroid Function Tests: No results for input(s): TSH, T4TOTAL, FREET4, T3FREE, THYROIDAB in the last 72 hours. Anemia Panel: No results for input(s): VITAMINB12, FOLATE, FERRITIN, TIBC, IRON, RETICCTPCT in the last 72 hours. Sepsis Labs: No results for input(s): PROCALCITON, LATICACIDVEN in the last 168 hours.  Recent Results (from the past 240 hour(s))  Resp Panel by RT-PCR (Flu A&B, Covid) Nasopharyngeal Swab     Status: None   Collection Time: 05/21/21 10:21 PM   Specimen: Nasopharyngeal Swab; Nasopharyngeal(NP) swabs in vial transport medium  Result Value Ref Range Status   SARS Coronavirus 2 by RT PCR NEGATIVE NEGATIVE  Final    Comment: (NOTE) SARS-CoV-2 target nucleic acids are NOT DETECTED.  The SARS-CoV-2 RNA is generally detectable in upper respiratory specimens during the acute phase of infection. The lowest concentration of SARS-CoV-2 viral copies this assay can detect is 138 copies/mL. A negative result does not preclude SARS-Cov-2 infection and should not be used as the sole basis for treatment or other patient management decisions. A negative result may occur with  improper specimen collection/handling, submission of specimen other than nasopharyngeal swab, presence of viral mutation(s) within the areas targeted by this assay, and inadequate number of viral copies(<138 copies/mL). A  negative result must be combined with clinical observations, patient history, and epidemiological information. The expected result is Negative.  Fact Sheet for Patients:  EntrepreneurPulse.com.au  Fact Sheet for Healthcare Providers:  IncredibleEmployment.be  This test is no t yet approved or cleared by the [place] FDA and  has been authorized for detection and/or diagnosis of SARS-CoV-2 by FDA under an Emergency Use Authorization (EUA). This EUA will remain  in effect (meaning this test can be used) for the duration of the COVID-19 declaration under Section 564(b)(1) of the Act, 21 U.S.C.section 360bbb-3(b)(1), unless the authorization is terminated  or revoked sooner.       Influenza A by PCR NEGATIVE NEGATIVE Final   Influenza B by PCR NEGATIVE NEGATIVE Final    Comment: (NOTE) The Xpert Xpress SARS-CoV-2/FLU/RSV plus assay is intended as an aid in the diagnosis of influenza from Nasopharyngeal swab specimens and should not be used as a sole basis for treatment. Nasal washings and aspirates are unacceptable for Xpert Xpress SARS-CoV-2/FLU/RSV testing.  Fact Sheet for Patients: EntrepreneurPulse.com.au  Fact Sheet for Healthcare  Providers: IncredibleEmployment.be  This test is not yet approved or cleared by the [place] FDA and has been authorized for detection and/or diagnosis of SARS-CoV-2 by FDA under an Emergency Use Authorization (EUA). This EUA will remain in effect (meaning this test can be used) for the duration of the COVID-19 declaration under Section 564(b)(1) of the Act, 21 U.S.C. section 360bbb-3(b)(1), unless the authorization is terminated or revoked.  Performed at [facility], [place] [address], [place], [place] [address]   Urine Culture     Status: Abnormal   Collection Time: 05/22/21  6:03 AM   Specimen: Urine, Clean Catch  Result Value Ref Range Status   Specimen Description URINE, CLEAN CATCH  Final   Special Requests NONE  Final   Culture (A)  Final    70,000 COLONIES/mL PSEUDOMONAS AERUGINOSA 70,000 COLONIES/mL ENTEROCOCCUS CASSELIFLAVUS    Report Status 05/25/2021 FINAL  Final   Organism ID, Bacteria PSEUDOMONAS AERUGINOSA (A)  Final   Organism ID, Bacteria ENTEROCOCCUS CASSELIFLAVUS (A)  Final      Susceptibility   Enterococcus casseliflavus - MIC*    AMPICILLIN <=2 SENSITIVE Sensitive     LEVOFLOXACIN 1 SENSITIVE Sensitive     NITROFURANTOIN <=16 SENSITIVE Sensitive     VANCOMYCIN RESISTANT Resistant     LINEZOLID Value in next row Sensitive      2 SENSITIVEPerformed at [place] [address] [place], [place] [address]    * 70,000 COLONIES/mL ENTEROCOCCUS CASSELIFLAVUS   Pseudomonas aeruginosa - MIC*    CEFTAZIDIME Value in next row Sensitive      2 SENSITIVEPerformed at [place] [address], [place], [place] [address]    CIPROFLOXACIN Value in next row Sensitive      2 SENSITIVEPerformed at [place] [address] [place], [place] [address]    GENTAMICIN Value in next row Sensitive      2 SENSITIVEPerformed at [place] [address] [place], [place] [address]    IMIPENEM Value in  next row Sensitive      2 SENSITIVEPerformed at [place] [address], [place], [place] [address]    PIP/TAZO Value in next row Sensitive      2 SENSITIVEPerformed at [place] [address], [place], [place] [address]    CEFEPIME Value in next row Sensitive  2 SENSITIVEPerformed at [facility], [place] [address] [place], [place] [address]    * 70,000 COLONIES/mL PSEUDOMONAS AERUGINOSA  Culture, blood (routine x 2)     Status: None (Preliminary result)   Collection Time: 05/22/21  4:08 PM   Specimen: BLOOD RIGHT HAND  Result Value Ref Range Status   Specimen Description   Final    BLOOD RIGHT HAND Performed at [place] [address] [place], [place] [address]    Special Requests   Final    BOTTLES DRAWN AEROBIC AND ANAEROBIC Blood Culture adequate volume Performed at [place] [address] [place], [place] [address]    Culture   Final    NO GROWTH 3 DAYS Performed at [facility], [place] [address] [place], [place] [address]    Report Status PENDING  Incomplete  Culture, blood (routine x 2)     Status: None (Preliminary result)   Collection Time: 05/22/21  4:08 PM   Specimen: BLOOD LEFT HAND  Result Value Ref Range Status   Specimen Description   Final    BLOOD LEFT HAND Performed at [place] [address] [place], [place] [address]    Special Requests   Final    BOTTLES DRAWN AEROBIC AND ANAEROBIC Blood Culture results may not be optimal due to an inadequate volume of blood received in culture bottles Performed at [place] [address], [place], [place] [address]    Culture   Final    NO GROWTH 3 DAYS Performed at [facility], [place] [address], [place], [place] [address]    Report Status PENDING  Incomplete         Radiology Studies: No results found.      Scheduled Meds:  feeding supplement (NEPRO CARB STEADY)  237  mL Oral Q24H   heparin  5,000 Units Subcutaneous Q8H   insulin aspart  0-6 Units Subcutaneous TID WC   [START ON 05/27/2021] levofloxacin  500 mg Oral Q48H   multivitamin  1 tablet Oral QHS   pravastatin  20 mg Oral Daily   sodium bicarbonate  1,300 mg Oral TID   Continuous Infusions:   LOS: 4 days    Time spent: 35 minutes.     Elmarie Shiley, MD [facility]   If 7PM-7AM, please contact night-coverage [email or web address]  05/25/2021, 3:53 PM

## 2021-05-25 NOTE — Progress Notes (Signed)
[facility] Progress Note   Subjective:   Feels well.  No uremic symptoms.  No new issues.  UOP 1725 mL yesterday up again by a lot . Cr trending down.   Denies dyspnea, orthopnea.   Objective Vitals:   05/24/21 0836 05/24/21 1703 05/24/21 2112 05/25/21 1017  BP: 132/71 (!) 122/58 136/68 127/71  Pulse: 87 84 85 87  Resp: 18 17 16 17   Temp: 98.4 F (36.9 C) 98 F (36.7 C) 99.6 F (37.6 C) 99.5 F (37.5 C)  TempSrc:   Oral Oral  SpO2: 98% 97% 97% 96%  Weight:      Height:       Physical Exam General: well-appearing, no acute distress  HEENT: anicteric sclera, oropharynx clear without lesions CV: normal rate, no murmurs, pitting edema in BLE but also with thigh and sacral edema Lungs: clear to auscultation bilaterally, normal work of breathing Abd: soft, non-tender, non-distended Skin: no visible lesions or rashes Psych: alert, engaged, appropriate mood and affect Musculoskeletal: no obvious deformities Neuro: normal speech, no gross focal deficits   Additional Objective Labs: Basic Metabolic Panel: Recent Labs  Lab 05/23/21 0010 05/24/21 0438 05/25/21 0228  NA 135 139 138  K 5.0 4.4 4.0  CL 106 108 109  CO2 16* 20* 20*  GLUCOSE 248* 195* 207*  BUN 49* 54* 52*  CREATININE 7.16* 6.70* 5.84*  CALCIUM 8.4* 8.1* 8.0*  PHOS 5.9* 5.3* 5.0*   Liver Function Tests: Recent Labs  Lab 05/21/21 2002 05/22/21 0603 05/23/21 0010 05/24/21 0438 05/25/21 0228  AST 19  --  18  --   --   ALT 11  --  11  --   --   ALKPHOS 81  --  75  --   --   BILITOT 0.7  --  0.4  --   --   PROT 7.5  --  6.7  --   --   ALBUMIN 3.6   < > 3.1* 2.8* 2.7*   < > = values in this interval not displayed.   No results for input(s): LIPASE, AMYLASE in the last 168 hours. CBC: Recent Labs  Lab 05/21/21 2002 05/22/21 0603 05/23/21 0010  WBC 5.0 4.3 4.4  NEUTROABS 3.2  --   --   HGB 9.0* 8.2* 8.1*  HCT 29.0* 26.5* 26.3*  MCV 95.1 97.1 95.6  PLT 170 133* 144*   Blood  Culture    Component Value Date/Time   SDES  05/22/2021 1608    BLOOD RIGHT HAND Performed at [facility], [place] [address], [place], [place] [address]    SDES  05/22/2021 1608    BLOOD LEFT HAND Performed at [facility], [place] [address] [place], [place] [address]    [place]  05/22/2021 1608    BOTTLES DRAWN AEROBIC AND ANAEROBIC Blood Culture adequate volume Performed at [place] [address] [place], [place] [address]    [place]  05/22/2021 1608    BOTTLES DRAWN AEROBIC AND ANAEROBIC Blood Culture results may not be optimal due to an inadequate volume of blood received in culture bottles Performed at [facility], [place] [address] [place] [address], [place], [place] [address]    CULT  05/22/2021 1608    NO GROWTH 3 DAYS Performed at [place] [address], [place], [place] [address]    CULT  05/22/2021 1608    NO GROWTH 3 DAYS Performed at [facility], [place]  [address], [place], [place] [address]    REPTSTATUS PENDING 05/22/2021 1608   REPTSTATUS PENDING 05/22/2021 1608    Cardiac Enzymes: Recent Labs  Lab 05/22/21 0603  CKTOTAL 70   CBG: Recent Labs  Lab 05/24/21 0643 05/24/21 1152 05/24/21 1702 05/24/21 2217 05/25/21 0822  GLUCAP 158* 161* 229* 263* 195*   Iron Studies: No results for input(s): IRON, TIBC, TRANSFERRIN, FERRITIN in the last 72 hours. @lablastinr3 @ Studies/Results: No results found. Medications:    feeding supplement (NEPRO CARB STEADY)  237 mL Oral Q24H   heparin  5,000 Units Subcutaneous Q8H   insulin aspart  0-6 Units Subcutaneous TID WC   levofloxacin  750 mg Oral Once   Followed by   Derrill Memo ON 05/27/2021] levofloxacin  500 mg Oral Q48H   multivitamin  1 tablet Oral QHS   pravastatin  20 mg Oral Daily   sodium bicarbonate  1,300 mg Oral TID    Assessment/Recommendations: [name] is a/an 81 y.o. female with a past medical history CKD,  anemia, HTN, DM2 and recent E coli bacteremia who present w/ AKI and volume overload    AKI on CKD4: Hard to definitively define baseline creatinine.  Seems to be worsening over the past year.  Likely most recent baseline is around 2.5-3.  Presented with creatinine up to nearly 7.  renal US no obstruction. Urinalysis with concerns of ongoing urinary tract infection.  Also with signs of volume overload on exam.  Blood pressures borderline low here.  I think most likely she suffered ATN from NSAID use, ARB use, borderline hypotension. -She has no need for renal replacement therapy and hopefully we're seeing recovery phase with ^d UOP and improving Cr  -No fluids or diuretics at this time.  Continue to monitor volume status closely-  think it will correct slowly on its own -holding ARB -Continue to monitor daily Cr, Dose meds for GFR -Monitor Daily I/Os, Daily weight   -UTI treatment as below   Hyperkalemia: Associated with AKI. PRN lokelma.  K 4 this AM.   Metabolic Acidosis: Associated with AKI.  Increased po bicarb.    UTI/recent E. coli bacteremia: Urinalysis with ongoing concerns of UTI.  Urine cx with GNR, speciation pending; blood cultures NGTD.  Cont ceftriaxone daily   Acute exacerbation of diastolic heart failure: Mildly volume overload on exam with bilateral lower extremity edema.  Holding diuretics for now,    Hypertension: Blood pressure borderline low at this time.  Hold home medications   Anemia: Unclear cause.  CKD likely playing role.   Continue to monitor and transfuse as needed   DM2: Management per primary team      Louis Meckel  05/25/2021, 12:18 PM  [facility] Pager: [phone]

## 2021-05-26 LAB — RENAL FUNCTION PANEL
Albumin: 2.9 g/dL — ABNORMAL LOW (ref 3.5–5.0)
Anion gap: 10 (ref 5–15)
BUN: 41 mg/dL — ABNORMAL HIGH (ref 8–23)
CO2: 23 mmol/L (ref 22–32)
Calcium: 8.3 mg/dL — ABNORMAL LOW (ref 8.9–10.3)
Chloride: 107 mmol/L (ref 98–111)
Creatinine, Ser: 4.77 mg/dL — ABNORMAL HIGH (ref 0.44–1.00)
GFR, Estimated: 9 mL/min — ABNORMAL LOW (ref >60)
Glucose, Bld: 151 mg/dL — ABNORMAL HIGH (ref 70–99)
Phosphorus: 4.2 mg/dL (ref 2.5–4.6)
Potassium: 3.9 mmol/L (ref 3.5–5.1)
Sodium: 140 mmol/L (ref 135–145)

## 2021-05-26 LAB — CBC
HCT: 26.1 % — ABNORMAL LOW (ref 36.0–46.0)
Hemoglobin: 8.3 g/dL — ABNORMAL LOW (ref 12.0–15.0)
MCH: 29.7 pg (ref 26.0–34.0)
MCHC: 31.8 g/dL (ref 30.0–36.0)
MCV: 93.5 fL (ref 80.0–100.0)
Platelets: 124 10*3/uL — ABNORMAL LOW (ref 150–400)
RBC: 2.79 MIL/uL — ABNORMAL LOW (ref 3.87–5.11)
RDW: 16.4 % — ABNORMAL HIGH (ref 11.5–15.5)
WBC: 7.3 10*3/uL (ref 4.0–10.5)
nRBC: 0 % (ref 0.0–0.2)

## 2021-05-26 LAB — GLUCOSE, CAPILLARY
Glucose-Capillary: 159 mg/dL — ABNORMAL HIGH (ref 70–99)
Glucose-Capillary: 198 mg/dL — ABNORMAL HIGH (ref 70–99)
Glucose-Capillary: 173 mg/dL — ABNORMAL HIGH (ref 70–99)
Glucose-Capillary: 196 mg/dL — ABNORMAL HIGH (ref 70–99)

## 2021-05-26 MED ORDER — INSULIN DETEMIR 100 UNIT/ML ~~LOC~~ SOLN
5.0000 [IU] | Freq: Every day | SUBCUTANEOUS | Status: DC
Start: 2021-05-26 — End: 2021-05-26

## 2021-05-26 NOTE — Progress Notes (Signed)
PROGRESS NOTE    [name]  BJY:[id number] DOB: May 11, 1940 DOA: 05/21/2021 PCP: Trey Sailors, PA   Brief Narrative: 81 year old with past medical history significant for CKD stage IIIb, anemia, CKD, hypertension, diabetes type 2 recent admission December 2022 with sepsis related to E. coli UTI and bacteremia was found to have significantly elevated creatinine during her routine check by her PCP and was therefore directed to the ED.  In the ER patient was found to have a creatinine of 6.7.  Prior creatinine 2.4 months ago, 3.2 a week prior.  Assessment & Plan:   Principal Problem:   ARF (acute renal failure) (HCC) Active Problems:   Essential hypertension   DM (diabetes mellitus), type 2 with renal complications (HCC)   CKD (chronic kidney disease), stage V ([place])  1-AKI on CKD stage IIIb: -Renal ultrasound negative for hydronephrosis -Recent cr 2.5--3 -Secondary to ATN from NSAID use, ARB use, borderline hypotension -Nephrology following -Treating for UTI as well, urine culture growing Pseudomonas and Enterococcus, antibiotic changed to Levaquin to cover both -Creatinine trending down: 4.7 -Continue to hold ARB  2-Metabolic Acidosis: Continue with sodium bicarb.  Hyperkalemia: Treated with Lokelma. Resolved.   Bilateral pleural effusion ascites Holding Diuretic due to AKI  Diabetes type 2: Continue with a sliding scale insulin  Hypertension: hydralazine as needed  Anemia of CKD: Stable Hb stable.   Remote history of A. fib: Not on anticoagulation Mild thrombocytopenia. Stable.    Nutrition Problem: Increased nutrient needs Etiology: acute illness    Signs/Symptoms: estimated needs    Interventions: MVI, Nepro shake  Estimated body mass index is 31.56 kg/m as calculated from the following:   Height as of this encounter: 5\' 4"  (1.626 m).   Weight as of this encounter: 83.4 kg.   DVT prophylaxis: Heparin Code Status: Full code Family  Communication: Discussed with daughter who was at bedside Disposition Plan:  Status is: Inpatient  Remains inpatient appropriate because: management of AKI and UTI. PT OT eval        Consultants:  Nephrology   Procedures:  [place]  Antimicrobials:    Subjective: She is feeling well, denies abdominal pain.    Objective: Vitals:   05/25/21 2054 05/26/21 0437 05/26/21 0438 05/26/21 0920  BP: (!) 145/67  139/76 (!) 145/79  Pulse: 84  92 94  Resp: 18  18 17   Temp: 98.9 F (37.2 C)  98.9 F (37.2 C) 98.2 F (36.8 C)  TempSrc: Oral  Oral   SpO2: 95%  96% 93%  Weight:  83.4 kg    Height:        Intake/Output Summary (Last 24 hours) at 05/26/2021 1349 Last data filed at 05/26/2021 0837 Gross per 24 hour  Intake 540 ml  Output 1000 ml  Net -460 ml    Filed Weights   05/21/21 1926 05/26/21 0437  Weight: 82.6 kg 83.4 kg    Examination:  General exam: NAD Respiratory system: CTA Cardiovascular system: S 1, S 2 RRR Gastrointestinal system: BS present, soft, nt Central nervous system: Alert, follows command Extremities: no edema    Data Reviewed: I have personally reviewed following labs and imaging studies  CBC: Recent Labs  Lab 05/21/21 2002 05/22/21 0603 05/23/21 0010 05/26/21 0632  WBC 5.0 4.3 4.4 7.3  NEUTROABS 3.2  --   --   --   HGB 9.0* 8.2* 8.1* 8.3*  HCT 29.0* 26.5* 26.3* 26.1*  MCV 95.1 97.1 95.6 93.5  PLT 170 133* 144* 124*  Basic Metabolic Panel: Recent Labs  Lab 05/21/21 2002 05/22/21 0603 05/22/21 1729 05/23/21 0010 05/24/21 0438 05/25/21 0228 05/26/21 0632  NA 138   < > 135 135 139 138 140  K 5.0   < > 5.2* 5.0 4.4 4.0 3.9  CL 98   < > 104 106 108 109 107  CO2 17*   < > 17* 16* 20* 20* 23  GLUCOSE 108*   < > 163* 248* 195* 207* 151*  BUN 50*   < > 54* 49* 54* 52* 41*  CREATININE 6.77*   < > 7.21* 7.16* 6.70* 5.84* 4.77*  CALCIUM 8.7*   < > 8.4* 8.4* 8.1* 8.0* 8.3*  MG 2.9*  --   --  2.5*  --   --   --   PHOS 5.5*   < >  6.3* 5.9* 5.3* 5.0* 4.2   < > = values in this interval not displayed.    GFR: Estimated Creatinine Clearance: 10 mL/min (A) (by C-G formula based on SCr of 4.77 mg/dL (H)). Liver Function Tests: Recent Labs  Lab 05/21/21 2002 05/22/21 0603 05/22/21 1729 05/23/21 0010 05/24/21 0438 05/25/21 0228 05/26/21 0632  AST 19  --   --  18  --   --   --   ALT 11  --   --  11  --   --   --   ALKPHOS 81  --   --  75  --   --   --   BILITOT 0.7  --   --  0.4  --   --   --   PROT 7.5  --   --  6.7  --   --   --   ALBUMIN 3.6   < > 3.5 3.1* 2.8* 2.7* 2.9*   < > = values in this interval not displayed.    No results for input(s): LIPASE, AMYLASE in the last 168 hours. No results for input(s): AMMONIA in the last 168 hours. Coagulation Profile: No results for input(s): INR, PROTIME in the last 168 hours. Cardiac Enzymes: Recent Labs  Lab 05/22/21 0603  CKTOTAL 70    BNP (last 3 results) Recent Labs    05/12/21 1311 05/20/21 1159  PROBNP 6,865* 10,030*    HbA1C: No results for input(s): HGBA1C in the last 72 hours. CBG: Recent Labs  Lab 05/25/21 1246 05/25/21 1631 05/25/21 2055 05/26/21 0638 05/26/21 1151  GLUCAP 223* 203* 144* 159* 198*    Lipid Profile: No results for input(s): CHOL, HDL, LDLCALC, TRIG, CHOLHDL, LDLDIRECT in the last 72 hours. Thyroid Function Tests: No results for input(s): TSH, T4TOTAL, FREET4, T3FREE, THYROIDAB in the last 72 hours. Anemia Panel: No results for input(s): VITAMINB12, FOLATE, FERRITIN, TIBC, IRON, RETICCTPCT in the last 72 hours. Sepsis Labs: No results for input(s): PROCALCITON, LATICACIDVEN in the last 168 hours.  Recent Results (from the past 240 hour(s))  Resp Panel by RT-PCR (Flu A&B, Covid) Nasopharyngeal Swab     Status: None   Collection Time: 05/21/21 10:21 PM   Specimen: Nasopharyngeal Swab; Nasopharyngeal(NP) swabs in vial transport medium  Result Value Ref Range Status   SARS Coronavirus 2 by RT PCR NEGATIVE  NEGATIVE Final    Comment: (NOTE) SARS-CoV-2 target nucleic acids are NOT DETECTED.  The SARS-CoV-2 RNA is generally detectable in upper respiratory specimens during the acute phase of infection. The lowest concentration of SARS-CoV-2 viral copies this assay can detect is 138 copies/mL. A negative result does not preclude SARS-Cov-2  infection and should not be used as the sole basis for treatment or other patient management decisions. A negative result may occur with  improper specimen collection/handling, submission of specimen other than nasopharyngeal swab, presence of viral mutation(s) within the areas targeted by this assay, and inadequate number of viral copies(<138 copies/mL). A negative result must be combined with clinical observations, patient history, and epidemiological information. The expected result is Negative.  Fact Sheet for Patients:  EntrepreneurPulse.com.au  Fact Sheet for Healthcare Providers:  IncredibleEmployment.be  This test is no t yet approved or cleared by the [place] FDA and  has been authorized for detection and/or diagnosis of SARS-CoV-2 by FDA under an Emergency Use Authorization (EUA). This EUA will remain  in effect (meaning this test can be used) for the duration of the COVID-19 declaration under Section 564(b)(1) of the Act, 21 U.S.C.section 360bbb-3(b)(1), unless the authorization is terminated  or revoked sooner.       Influenza A by PCR NEGATIVE NEGATIVE Final   Influenza B by PCR NEGATIVE NEGATIVE Final    Comment: (NOTE) The Xpert Xpress SARS-CoV-2/FLU/RSV plus assay is intended as an aid in the diagnosis of influenza from Nasopharyngeal swab specimens and should not be used as a sole basis for treatment. Nasal washings and aspirates are unacceptable for Xpert Xpress SARS-CoV-2/FLU/RSV testing.  Fact Sheet for Patients: EntrepreneurPulse.com.au  Fact Sheet for Healthcare  Providers: IncredibleEmployment.be  This test is not yet approved or cleared by the [place] FDA and has been authorized for detection and/or diagnosis of SARS-CoV-2 by FDA under an Emergency Use Authorization (EUA). This EUA will remain in effect (meaning this test can be used) for the duration of the COVID-19 declaration under Section 564(b)(1) of the Act, 21 U.S.C. section 360bbb-3(b)(1), unless the authorization is terminated or revoked.  Performed at [facility], [place] [address], [place], [place] [address]   Urine Culture     Status: Abnormal   Collection Time: 05/22/21  6:03 AM   Specimen: Urine, Clean Catch  Result Value Ref Range Status   Specimen Description URINE, CLEAN CATCH  Final   Special Requests NONE  Final   Culture (A)  Final    70,000 COLONIES/mL PSEUDOMONAS AERUGINOSA 70,000 COLONIES/mL ENTEROCOCCUS CASSELIFLAVUS    Report Status 05/25/2021 FINAL  Final   Organism ID, Bacteria PSEUDOMONAS AERUGINOSA (A)  Final   Organism ID, Bacteria ENTEROCOCCUS CASSELIFLAVUS (A)  Final      Susceptibility   Enterococcus casseliflavus - MIC*    AMPICILLIN <=2 SENSITIVE Sensitive     LEVOFLOXACIN 1 SENSITIVE Sensitive     NITROFURANTOIN <=16 SENSITIVE Sensitive     VANCOMYCIN RESISTANT Resistant     LINEZOLID Value in next row Sensitive      2 SENSITIVEPerformed at [place] [address] [place], [place] [address]    * 70,000 COLONIES/mL ENTEROCOCCUS CASSELIFLAVUS   Pseudomonas aeruginosa - MIC*    CEFTAZIDIME Value in next row Sensitive      2 SENSITIVEPerformed at [place] [address], [place], [place] [address]    CIPROFLOXACIN Value in next row Sensitive      2 SENSITIVEPerformed at [place] [address] [place], [place] [address]    GENTAMICIN Value in next row Sensitive      2 SENSITIVEPerformed at [facility] [address] [place], [place] [address]    IMIPENEM Value in  next row Sensitive      2  SENSITIVEPerformed at [facility], [place] [address] [place], [place] [address]    PIP/TAZO Value in next row Sensitive      2 SENSITIVEPerformed at [place] [address] [place], [place] [address]    CEFEPIME Value in next row Sensitive      2 SENSITIVEPerformed at [place] [address] [place], [place] [address]    * 70,000 COLONIES/mL PSEUDOMONAS AERUGINOSA  Culture, blood (routine x 2)     Status: None (Preliminary result)   Collection Time: 05/22/21  4:08 PM   Specimen: BLOOD RIGHT HAND  Result Value Ref Range Status   Specimen Description   Final    BLOOD RIGHT HAND Performed at [facility] [address] [place], [place] [address]    Special Requests   Final    BOTTLES DRAWN AEROBIC AND ANAEROBIC Blood Culture adequate volume Performed at [place] [address], [place], [place] [address]    Culture   Final    NO GROWTH 4 DAYS Performed at [facility], [place] [address], [place], [place] [address]    Report Status PENDING  Incomplete  Culture, blood (routine x 2)     Status: None (Preliminary result)   Collection Time: 05/22/21  4:08 PM   Specimen: BLOOD LEFT HAND  Result Value Ref Range Status   Specimen Description   Final    BLOOD LEFT HAND Performed at [facility] [address], [place], [place] [address]    Special Requests   Final    BOTTLES DRAWN AEROBIC AND ANAEROBIC Blood Culture results may not be optimal due to an inadequate volume of blood received in culture bottles Performed at [place] [address], [place], [place] [address]    Culture   Final    NO GROWTH 4 DAYS Performed at [facility], [place] [address] [place], [place] [address]    Report Status PENDING  Incomplete          Radiology Studies: No results found.      Scheduled Meds:  feeding supplement (NEPRO CARB STEADY)  237  mL Oral Q24H   heparin  5,000 Units Subcutaneous Q8H   insulin aspart  0-6 Units Subcutaneous TID WC   [START ON 05/27/2021] levofloxacin  500 mg Oral Q48H   multivitamin  1 tablet Oral QHS   pravastatin  20 mg Oral Daily   sodium bicarbonate  1,300 mg Oral TID   Continuous Infusions:   LOS: 5 days    Time spent: 35 minutes.     Elmarie Shiley, MD [facility]   If 7PM-7AM, please contact night-coverage [email or web address]  05/26/2021, 1:49 PM

## 2021-05-26 NOTE — Progress Notes (Addendum)
Physical Therapy Treatment Patient Details Name: [name] MRN: [id number] DOB: 03-31-1941 Today's Date: 05/26/2021   History of Present Illness [name] is a 81 y.o. female presented to ED from PCP office with elevated creatinine and found to have acute renal failure and volume overload. Also noted to have bilateral pleural effusions and ascites. PMHx: CKD stage III, anemia, HTN DM2 admitted last month for sepsis from UTI with E. coli bacteremia.    PT Comments    Pt remains quite weak and deconditioned; needs aggressive mobilization out of bed. Pt requiring min-mod assist for transfers. Ambulating 10 ft x 2 with a walker and chair follow. Will continue efforts.   Recommendations for follow up therapy are one component of a multi-disciplinary discharge planning process, led by the attending physician.  Recommendations may be updated based on patient status, additional functional criteria and insurance authorization.  Follow Up Recommendations  Home health PT     Assistance Recommended at Discharge Frequent or constant Supervision/Assistance  Patient can return home with the following A little help with walking and/or transfers;A little help with bathing/dressing/bathroom;Help with stairs or ramp for entrance;Assist for transportation;Assistance with cooking/housework   Equipment Recommendations  Hospital bed    Recommendations for Other Services       Precautions / Restrictions Precautions Precautions: Fall Restrictions Weight Bearing Restrictions: No     Mobility  Bed Mobility Overal bed mobility: Needs Assistance Bed Mobility: Supine to Sit     Supine to sit: Supervision     General bed mobility comments: Increased time, step by step cueing, no physical assist required    Transfers Overall transfer level: Needs assistance Equipment used: Rolling walker (2 wheels) Transfers: Sit to/from Stand Sit to Stand: Min assist, Mod assist           General  transfer comment: Min-modA to rise to stand x 3    Ambulation/Gait Ambulation/Gait assistance: Min assist Gait Distance (Feet): 20 Feet (10 ft x 2) Assistive device: Rolling walker (2 wheels) Gait Pattern/deviations: Step-through pattern, Decreased stride length, Trunk flexed Gait velocity: decreased Gait velocity interpretation: <1.31 ft/sec, indicative of household ambulator   General Gait Details: Pt ambulating 10 ft x 2 with one seated rest break, fatigues easily and benefits from encouragement to keep going within tolerance as well as cues for larger step lengths   Stairs             Wheelchair Mobility    Modified Rankin (Stroke Patients Only)       Balance Overall balance assessment: Needs assistance Sitting-balance support: No upper extremity supported, Feet supported Sitting balance-Leahy Scale: Fair     Standing balance support: During functional activity, Reliant on assistive device for balance Standing balance-Leahy Scale: Poor                              Cognition Arousal/Alertness: Awake/alert Behavior During Therapy: WFL for tasks assessed/performed Overall Cognitive Status: No family/caregiver present to determine baseline cognitive functioning                                 General Comments: slower processing, increased time and repetition to follow 1 step commands        Exercises      General Comments        Pertinent Vitals/Pain Pain Assessment Pain Assessment: No/denies pain    Home Living  Prior Function            PT Goals (current goals can now be found in the care plan section) Acute Rehab PT Goals Patient Stated Goal: to go home Potential to Achieve Goals: Good Progress towards PT goals: Progressing toward goals    Frequency    Min 3X/week      PT Plan Current plan remains appropriate    Co-evaluation              AM-PAC PT "6 Clicks"  Mobility   Outcome Measure  Help needed turning from your back to your side while in a flat bed without using bedrails?: A Little Help needed moving from lying on your back to sitting on the side of a flat bed without using bedrails?: A Little Help needed moving to and from a bed to a chair (including a wheelchair)?: A Little Help needed standing up from a chair using your arms (e.g., wheelchair or bedside chair)?: A Lot Help needed to walk in hospital room?: A Little Help needed climbing 3-5 steps with a railing? : Total 6 Click Score: 15    End of Session Equipment Utilized During Treatment: Gait belt Activity Tolerance: Patient limited by fatigue Patient left: in chair;with call bell/phone within reach Nurse Communication: Mobility status PT Visit Diagnosis: Unsteadiness on feet (R26.81);Muscle weakness (generalized) (M62.81)     Time: 0100-7121 PT Time Calculation (min) (ACUTE ONLY): 18 min  Charges:  $Therapeutic Activity: 8-22 mins                     Wyona Almas, PT, DPT Acute Rehabilitation Services Pager [phone] Office [phone]    Deno Etienne 05/26/2021, 3:17 PM

## 2021-05-26 NOTE — Care Management Important Message (Signed)
Important Message  Patient Details  Name: [name] MRN: [id number] Date of Birth: Oct 13, 1940   Medicare Important Message Given:  Yes     [place] [address] 05/26/2021, 10:11 AM

## 2021-05-26 NOTE — Progress Notes (Signed)
[facility] Progress Note   Subjective:   Feels well.  No uremic symptoms.  No new issues.  UOP 1000 mL Cr still trending down.   Denies dyspnea, orthopnea.   Objective Vitals:   05/25/21 2054 05/26/21 0437 05/26/21 0438 05/26/21 0920  BP: (!) 145/67  139/76 (!) 145/79  Pulse: 84  92 94  Resp: 18  18 17   Temp: 98.9 F (37.2 C)  98.9 F (37.2 C) 98.2 F (36.8 C)  TempSrc: Oral  Oral   SpO2: 95%  96% 93%  Weight:  83.4 kg    Height:       Physical Exam General: well-appearing, no acute distress  HEENT: anicteric sclera, oropharynx clear without lesions CV: normal rate, no murmurs, pitting edema in BLE but also with thigh and sacral edema Lungs: clear to auscultation bilaterally, normal work of breathing Abd: soft, non-tender, non-distended Skin: no visible lesions or rashes Psych: alert, engaged, appropriate mood and affect Musculoskeletal: no obvious deformities Neuro: normal speech, no gross focal deficits   Additional Objective Labs: Basic Metabolic Panel: Recent Labs  Lab 05/24/21 0438 05/25/21 0228 05/26/21 0632  NA 139 138 140  K 4.4 4.0 3.9  CL 108 109 107  CO2 20* 20* 23  GLUCOSE 195* 207* 151*  BUN 54* 52* 41*  CREATININE 6.70* 5.84* 4.77*  CALCIUM 8.1* 8.0* 8.3*  PHOS 5.3* 5.0* 4.2   Liver Function Tests: Recent Labs  Lab 05/21/21 2002 05/22/21 0603 05/23/21 0010 05/24/21 0438 05/25/21 0228 05/26/21 0632  AST 19  --  18  --   --   --   ALT 11  --  11  --   --   --   ALKPHOS 81  --  75  --   --   --   BILITOT 0.7  --  0.4  --   --   --   PROT 7.5  --  6.7  --   --   --   ALBUMIN 3.6   < > 3.1* 2.8* 2.7* 2.9*   < > = values in this interval not displayed.   No results for input(s): LIPASE, AMYLASE in the last 168 hours. CBC: Recent Labs  Lab 05/21/21 2002 05/22/21 0603 05/23/21 0010 05/26/21 0632  WBC 5.0 4.3 4.4 7.3  NEUTROABS 3.2  --   --   --   HGB 9.0* 8.2* 8.1* 8.3*  HCT 29.0* 26.5* 26.3* 26.1*  MCV 95.1 97.1  95.6 93.5  PLT 170 133* 144* 124*   Blood Culture    Component Value Date/Time   SDES  05/22/2021 1608    BLOOD RIGHT HAND Performed at [facility], [place] [address], [place], [place] [address]    SDES  05/22/2021 1608    BLOOD LEFT HAND Performed at [facility], [place] [address], [place], [place] [address]    [place]  05/22/2021 1608    BOTTLES DRAWN AEROBIC AND ANAEROBIC Blood Culture adequate volume Performed at [place] [address] [place], [place] [address]    [place]  05/22/2021 1608    BOTTLES DRAWN AEROBIC AND ANAEROBIC Blood Culture results may not be optimal due to an inadequate volume of blood received in culture bottles Performed at [facility], [place] [address] [place], [place] [address]    CULT  05/22/2021 1608    NO GROWTH 4 DAYS Performed at [facility] [address], [place], [place] [address]  CULT  05/22/2021 1608    NO GROWTH 4 DAYS Performed at [facility], [place] [address], [place], [place] [address]    REPTSTATUS PENDING 05/22/2021 1608   REPTSTATUS PENDING 05/22/2021 1608    Cardiac Enzymes: Recent Labs  Lab 05/22/21 0603  CKTOTAL 70   CBG: Recent Labs  Lab 05/25/21 0822 05/25/21 1246 05/25/21 1631 05/25/21 2055 05/26/21 0638  GLUCAP 195* 223* 203* 144* 159*   Iron Studies: No results for input(s): IRON, TIBC, TRANSFERRIN, FERRITIN in the last 72 hours. @lablastinr3 @ Studies/Results: No results found. Medications:    feeding supplement (NEPRO CARB STEADY)  237 mL Oral Q24H   heparin  5,000 Units Subcutaneous Q8H   insulin aspart  0-6 Units Subcutaneous TID WC   [START ON 05/27/2021] levofloxacin  500 mg Oral Q48H   multivitamin  1 tablet Oral QHS   pravastatin  20 mg Oral Daily   sodium bicarbonate  1,300 mg Oral TID    Assessment/Recommendations: [name] is a/an 81 y.o. female with a past medical history CKD, anemia,  HTN, DM2 and recent E coli bacteremia who present w/ AKI and volume overload    AKI on CKD4: Hard to definitively define baseline creatinine.  Seems to be worsening over the past year.  Likely most recent baseline is around 2.5-3.  Presented with creatinine up to nearly 7.  renal US no obstruction. Urinalysis with concerns of ongoing urinary tract infection.  Also with signs of volume overload on exam.  Blood pressures borderline low here.  I think most likely she suffered ATN from NSAID use, ARB use, borderline hypotension. -She has no need for renal replacement therapy and hopefully we're seeing recovery phase with ^d UOP and improving Cr  -No fluids or diuretics at this time.  Continue to monitor volume status closely-  think it will correct slowly on its own -holding ARB -  I would say once crt under 4 I would be comfortable with discharge-  she will need nephrology follow up at discharge    -UTI treatment as below   Hyperkalemia: resolved   Metabolic Acidosis: Associated with AKI.  Increased po bicarb. Resolving    UTI/recent E. coli bacteremia: Urinalysis with ongoing concerns of UTI.  Urine cx with GNR, speciation pending; blood cultures NGTD.  Cont ceftriaxone daily   Acute exacerbation of diastolic heart failure: Mildly volume overload on exam with bilateral lower extremity edema.  Holding diuretics for now,    Hypertension: Blood pressure borderline low at this time.  Hold home medications   Anemia: Unclear cause.  CKD likely playing role.   Continue to monitor and transfuse as needed-  has been stable    DM2: Management per primary team      [name]  05/26/2021, 11:40 AM  [facility] Pager: [phone]

## 2021-05-27 LAB — RENAL FUNCTION PANEL
Albumin: 2.7 g/dL — ABNORMAL LOW (ref 3.5–5.0)
Anion gap: 10 (ref 5–15)
BUN: 40 mg/dL — ABNORMAL HIGH (ref 8–23)
CO2: 22 mmol/L (ref 22–32)
Calcium: 8.4 mg/dL — ABNORMAL LOW (ref 8.9–10.3)
Chloride: 106 mmol/L (ref 98–111)
Creatinine, Ser: 4.16 mg/dL — ABNORMAL HIGH (ref 0.44–1.00)
GFR, Estimated: 10 mL/min — ABNORMAL LOW (ref >60)
Glucose, Bld: 153 mg/dL — ABNORMAL HIGH (ref 70–99)
Phosphorus: 3.9 mg/dL (ref 2.5–4.6)
Potassium: 3.9 mmol/L (ref 3.5–5.1)
Sodium: 138 mmol/L (ref 135–145)

## 2021-05-27 LAB — GLUCOSE, CAPILLARY
Glucose-Capillary: 148 mg/dL — ABNORMAL HIGH (ref 70–99)
Glucose-Capillary: 179 mg/dL — ABNORMAL HIGH (ref 70–99)
Glucose-Capillary: 222 mg/dL — ABNORMAL HIGH (ref 70–99)
Glucose-Capillary: 231 mg/dL — ABNORMAL HIGH (ref 70–99)

## 2021-05-27 LAB — CULTURE, BLOOD (ROUTINE X 2)
Culture: NO GROWTH
Culture: NO GROWTH
Special Requests: ADEQUATE

## 2021-05-27 NOTE — Progress Notes (Signed)
PROGRESS NOTE    [name]  DGL:[id number] DOB: 1941/01/03 DOA: 05/21/2021 PCP: Trey Sailors, PA   Brief Narrative: 81 year old with past medical history significant for CKD stage IIIb, anemia, CKD, hypertension, diabetes type 2 recent admission December 2022 with sepsis related to E. coli UTI and bacteremia was found to have significantly elevated creatinine during her routine check by her PCP and was therefore directed to the ED.  In the ER patient was found to have a creatinine of 6.7.  Prior creatinine 2.4 months ago, 3.2 a week prior.  Assessment & Plan:   Principal Problem:   ARF (acute renal failure) (HCC) Active Problems:   Essential hypertension   DM (diabetes mellitus), type 2 with renal complications (HCC)   CKD (chronic kidney disease), stage V ([place])  1-AKI on CKD stage IIIb: -Renal ultrasound negative for hydronephrosis -Recent cr 2.5--3 -Secondary to ATN from NSAID use, ARB use, borderline hypotension -Nephrology following -Treating for UTI as well, urine culture growing Pseudomonas and Enterococcus, antibiotic changed to Levaquin to cover both -Creatinine trending down: 4.7---4.6 -Continue to hold ARB  2-Metabolic Acidosis: Continue with sodium bicarb.  Hyperkalemia: Treated with Lokelma. Resolved.   Bilateral pleural effusion ascites Holding Diuretic due to AKI  Diabetes type 2: Continue with a sliding scale insulin  Hypertension: hydralazine as needed  Anemia of CKD: Stable Hb stable. Yesterday   Remote history of A. fib: Not on anticoagulation Mild thrombocytopenia. Stable.    Nutrition Problem: Increased nutrient needs Etiology: acute illness    Signs/Symptoms: estimated needs    Interventions: MVI, Nepro shake  Estimated body mass index is 32.17 kg/m as calculated from the following:   Height as of this encounter: 5\' 4"  (1.626 m).   Weight as of this encounter: 85 kg.   DVT prophylaxis: Heparin Code Status: Full  code Family Communication: Discussed with daughter who was at bedside 1/23 Disposition Plan:  Status is: Inpatient  Remains inpatient appropriate because: Home tomorrow if cr less than 4        Consultants:  Nephrology   Procedures:  [place]  Antimicrobials:    Subjective: No new complaints feel better.   Objective: Vitals:   05/26/21 0920 05/26/21 2121 05/27/21 0430 05/27/21 0957  BP: (!) 145/79 139/87 (!) 145/71 135/74  Pulse: 94 82 89 83  Resp: 17 18 18 17   Temp: 98.2 F (36.8 C) 98.4 F (36.9 C) 98.7 F (37.1 C) 98.3 F (36.8 C)  TempSrc:   Oral   SpO2: 93% 100% 95% 95%  Weight:   85 kg   Height:        Intake/Output Summary (Last 24 hours) at 05/27/2021 1327 Last data filed at 05/27/2021 0800 Gross per 24 hour  Intake 720 ml  Output 550 ml  Net 170 ml    Filed Weights   05/21/21 1926 05/26/21 0437 05/27/21 0430  Weight: 82.6 kg 83.4 kg 85 kg    Examination:  General exam: NAD Respiratory system: CTA Cardiovascular system: S 1, S 2 RRR Gastrointestinal system: BS present, soft, nt Central nervous system: Alert, follows command Extremities: No edema    Data Reviewed: I have personally reviewed following labs and imaging studies  CBC: Recent Labs  Lab 05/21/21 2002 05/22/21 0603 05/23/21 0010 05/26/21 0632  WBC 5.0 4.3 4.4 7.3  NEUTROABS 3.2  --   --   --   HGB 9.0* 8.2* 8.1* 8.3*  HCT 29.0* 26.5* 26.3* 26.1*  MCV 95.1 97.1 95.6 93.5  PLT 170  133* 144* 124*    Basic Metabolic Panel: Recent Labs  Lab 05/21/21 2002 05/22/21 0603 05/23/21 0010 05/24/21 0438 05/25/21 0228 05/26/21 0632 05/27/21 0252  NA 138   < > 135 139 138 140 138  K 5.0   < > 5.0 4.4 4.0 3.9 3.9  CL 98   < > 106 108 109 107 106  CO2 17*   < > 16* 20* 20* 23 22  GLUCOSE 108*   < > 248* 195* 207* 151* 153*  BUN 50*   < > 49* 54* 52* 41* 40*  CREATININE 6.77*   < > 7.16* 6.70* 5.84* 4.77* 4.16*  CALCIUM 8.7*   < > 8.4* 8.1* 8.0* 8.3* 8.4*  MG 2.9*  --  2.5*   --   --   --   --   PHOS 5.5*   < > 5.9* 5.3* 5.0* 4.2 3.9   < > = values in this interval not displayed.    GFR: Estimated Creatinine Clearance: 11.6 mL/min (A) (by C-G formula based on SCr of 4.16 mg/dL (H)). Liver Function Tests: Recent Labs  Lab 05/21/21 2002 05/22/21 0603 05/23/21 0010 05/24/21 7253 05/25/21 0228 05/26/21 6644 05/27/21 0252  AST 19  --  18  --   --   --   --   ALT 11  --  11  --   --   --   --   ALKPHOS 81  --  75  --   --   --   --   BILITOT 0.7  --  0.4  --   --   --   --   PROT 7.5  --  6.7  --   --   --   --   ALBUMIN 3.6   < > 3.1* 2.8* 2.7* 2.9* 2.7*   < > = values in this interval not displayed.    No results for input(s): LIPASE, AMYLASE in the last 168 hours. No results for input(s): AMMONIA in the last 168 hours. Coagulation Profile: No results for input(s): INR, PROTIME in the last 168 hours. Cardiac Enzymes: Recent Labs  Lab 05/22/21 0603  CKTOTAL 70    BNP (last 3 results) Recent Labs    05/12/21 1311 05/20/21 1159  PROBNP 6,865* 10,030*    HbA1C: No results for input(s): HGBA1C in the last 72 hours. CBG: Recent Labs  Lab 05/26/21 1151 05/26/21 1629 05/26/21 2121 05/27/21 0640 05/27/21 1132  GLUCAP 198* 173* 196* 148* 179*    Lipid Profile: No results for input(s): CHOL, HDL, LDLCALC, TRIG, CHOLHDL, LDLDIRECT in the last 72 hours. Thyroid Function Tests: No results for input(s): TSH, T4TOTAL, FREET4, T3FREE, THYROIDAB in the last 72 hours. Anemia Panel: No results for input(s): VITAMINB12, FOLATE, FERRITIN, TIBC, IRON, RETICCTPCT in the last 72 hours. Sepsis Labs: No results for input(s): PROCALCITON, LATICACIDVEN in the last 168 hours.  Recent Results (from the past 240 hour(s))  Resp Panel by RT-PCR (Flu A&B, Covid) Nasopharyngeal Swab     Status: None   Collection Time: 05/21/21 10:21 PM   Specimen: Nasopharyngeal Swab; Nasopharyngeal(NP) swabs in vial transport medium  Result Value Ref Range Status   SARS  Coronavirus 2 by RT PCR NEGATIVE NEGATIVE Final    Comment: (NOTE) SARS-CoV-2 target nucleic acids are NOT DETECTED.  The SARS-CoV-2 RNA is generally detectable in upper respiratory specimens during the acute phase of infection. The lowest concentration of SARS-CoV-2 viral copies this assay can detect is 138 copies/mL. A  negative result does not preclude SARS-Cov-2 infection and should not be used as the sole basis for treatment or other patient management decisions. A negative result may occur with  improper specimen collection/handling, submission of specimen other than nasopharyngeal swab, presence of viral mutation(s) within the areas targeted by this assay, and inadequate number of viral copies(<138 copies/mL). A negative result must be combined with clinical observations, patient history, and epidemiological information. The expected result is Negative.  Fact Sheet for Patients:  EntrepreneurPulse.com.au  Fact Sheet for Healthcare Providers:  IncredibleEmployment.be  This test is no t yet approved or cleared by the [place] FDA and  has been authorized for detection and/or diagnosis of SARS-CoV-2 by FDA under an Emergency Use Authorization (EUA). This EUA will remain  in effect (meaning this test can be used) for the duration of the COVID-19 declaration under Section 564(b)(1) of the Act, 21 U.S.C.section 360bbb-3(b)(1), unless the authorization is terminated  or revoked sooner.       Influenza A by PCR NEGATIVE NEGATIVE Final   Influenza B by PCR NEGATIVE NEGATIVE Final    Comment: (NOTE) The Xpert Xpress SARS-CoV-2/FLU/RSV plus assay is intended as an aid in the diagnosis of influenza from Nasopharyngeal swab specimens and should not be used as a sole basis for treatment. Nasal washings and aspirates are unacceptable for Xpert Xpress SARS-CoV-2/FLU/RSV testing.  Fact Sheet for  Patients: EntrepreneurPulse.com.au  Fact Sheet for Healthcare Providers: IncredibleEmployment.be  This test is not yet approved or cleared by the [place] FDA and has been authorized for detection and/or diagnosis of SARS-CoV-2 by FDA under an Emergency Use Authorization (EUA). This EUA will remain in effect (meaning this test can be used) for the duration of the COVID-19 declaration under Section 564(b)(1) of the Act, 21 U.S.C. section 360bbb-3(b)(1), unless the authorization is terminated or revoked.  Performed at [facility], [place] [address] [place], [place] [address]   Urine Culture     Status: Abnormal   Collection Time: 05/22/21  6:03 AM   Specimen: Urine, Clean Catch  Result Value Ref Range Status   Specimen Description URINE, CLEAN CATCH  Final   Special Requests NONE  Final   Culture (A)  Final    70,000 COLONIES/mL PSEUDOMONAS AERUGINOSA 70,000 COLONIES/mL ENTEROCOCCUS CASSELIFLAVUS    Report Status 05/25/2021 FINAL  Final   Organism ID, Bacteria PSEUDOMONAS AERUGINOSA (A)  Final   Organism ID, Bacteria ENTEROCOCCUS CASSELIFLAVUS (A)  Final      Susceptibility   Enterococcus casseliflavus - MIC*    AMPICILLIN <=2 SENSITIVE Sensitive     LEVOFLOXACIN 1 SENSITIVE Sensitive     NITROFURANTOIN <=16 SENSITIVE Sensitive     VANCOMYCIN RESISTANT Resistant     LINEZOLID Value in next row Sensitive      2 SENSITIVEPerformed at [place] [address] [place], [place] [address]    * 70,000 COLONIES/mL ENTEROCOCCUS CASSELIFLAVUS   Pseudomonas aeruginosa - MIC*    CEFTAZIDIME Value in next row Sensitive      2 SENSITIVEPerformed at [place] [address], [place], [place] [address]    CIPROFLOXACIN Value in next row Sensitive      2 SENSITIVEPerformed at [place] [address] [place], [place] [address]    GENTAMICIN Value in next row Sensitive      2 SENSITIVEPerformed at [place] [address] [place], [place] [address]    IMIPENEM Value in next row Sensitive  2 SENSITIVEPerformed at [facility], [place] [address] [place], [place] [address]    PIP/TAZO Value in next row Sensitive      2 SENSITIVEPerformed at [place] [address] [place], [place] [address]    CEFEPIME Value in next row Sensitive      2 SENSITIVEPerformed at [place] [address] [place], [place] [address]    * 70,000 COLONIES/mL PSEUDOMONAS AERUGINOSA  Culture, blood (routine x 2)     Status: None   Collection Time: 05/22/21  4:08 PM   Specimen: BLOOD RIGHT HAND  Result Value Ref Range Status   Specimen Description   Final    BLOOD RIGHT HAND Performed at [place] [address], [place], [place] [address]    Special Requests   Final    BOTTLES DRAWN AEROBIC AND ANAEROBIC Blood Culture adequate volume Performed at [place] [address], [place], [place] [address]    Culture   Final    NO GROWTH 5 DAYS Performed at [facility], [place] [address] [place], [place] [address]    Report Status 05/27/2021 FINAL  Final  Culture, blood (routine x 2)     Status: None   Collection Time: 05/22/21  4:08 PM   Specimen: BLOOD LEFT HAND  Result Value Ref Range Status   Specimen Description   Final    BLOOD LEFT HAND Performed at [place] [address], [place], [place] [address]    Special Requests   Final    BOTTLES DRAWN AEROBIC AND ANAEROBIC Blood Culture results may not be optimal due to an inadequate volume of blood received in culture bottles Performed at [place] [address], [place], [place] [address]    Culture   Final    NO GROWTH 5 DAYS Performed at [facility], [place] [address], [place],  [address]    Report Status 05/27/2021 FINAL  Final          Radiology Studies: No results found.      Scheduled  Meds:  feeding supplement (NEPRO CARB STEADY)  237 mL Oral Q24H   heparin  5,000 Units Subcutaneous Q8H   insulin aspart  0-6 Units Subcutaneous TID WC   levofloxacin  500 mg Oral Q48H   multivitamin  1 tablet Oral QHS   pravastatin  20 mg Oral Daily   sodium bicarbonate  1,300 mg Oral TID   Continuous Infusions:   LOS: 6 days    Time spent: 35 minutes.     Elmarie Shiley, MD [facility]   If 7PM-7AM, please contact night-coverage [email or web address]  05/27/2021, 1:27 PM

## 2021-05-27 NOTE — Progress Notes (Signed)
Physical Therapy Treatment Patient Details Name: [name] MRN: [id number] DOB: 1941/04/05 Today's Date: 05/27/2021   History of Present Illness [name] is a 81 y.o. female presented to ED from PCP office with elevated creatinine and found to have acute renal failure and volume overload. Also noted to have bilateral pleural effusions and ascites. PMHx: CKD stage III, anemia, HTN DM2 admitted last month for sepsis from UTI with E. coli bacteremia.    PT Comments    Pt progressing incrementally towards her physical therapy goals, requiring less assist for transfers this session. Pt ambulating in total ~40 ft with 3 seated rest breaks. Continues with generalized weakness, decreased endurance, and balance deficits. D/c plan remains appropriate.    Recommendations for follow up therapy are one component of a multi-disciplinary discharge planning process, led by the attending physician.  Recommendations may be updated based on patient status, additional functional criteria and insurance authorization.  Follow Up Recommendations  Home health PT     Assistance Recommended at Discharge Frequent or constant Supervision/Assistance  Patient can return home with the following A little help with walking and/or transfers;A little help with bathing/dressing/bathroom;Help with stairs or ramp for entrance;Assist for transportation;Assistance with cooking/housework   Equipment Recommendations  Hospital bed    Recommendations for Other Services       Precautions / Restrictions Precautions Precautions: Fall Restrictions Weight Bearing Restrictions: No     Mobility  Bed Mobility Overal bed mobility: Needs Assistance Bed Mobility: Supine to Sit     Supine to sit: Supervision     General bed mobility comments: Increased time, step by step cueing, no physical assist required    Transfers Overall transfer level: Needs assistance Equipment used: Rolling walker (2 wheels) Transfers:  Sit to/from Stand Sit to Stand: Min assist           General transfer comment: MinA to rise from bed and chair x 3    Ambulation/Gait Ambulation/Gait assistance: Min assist Gait Distance (Feet): 40 Feet (10 + 15 + 15 ft) Assistive device: Rolling walker (2 wheels) Gait Pattern/deviations: Step-through pattern, Decreased stride length, Trunk flexed Gait velocity: decreased     General Gait Details: Pt with increased pronation L foot and increased external rotation R foot. ambulating with slow and effortful pace, chair follow utilized. benefits from providing visual targets and encouargement for progressively increasing distance   Stairs             Wheelchair Mobility    Modified Rankin (Stroke Patients Only)       Balance Overall balance assessment: Needs assistance Sitting-balance support: No upper extremity supported, Feet supported Sitting balance-Leahy Scale: Fair     Standing balance support: During functional activity, Reliant on assistive device for balance Standing balance-Leahy Scale: Poor                              Cognition Arousal/Alertness: Awake/alert Behavior During Therapy: WFL for tasks assessed/performed Overall Cognitive Status: No family/caregiver present to determine baseline cognitive functioning                                 General Comments: slower processing, increased time and repetition to follow 1 step commands        Exercises      General Comments        Pertinent Vitals/Pain Pain Assessment Pain Assessment: No/denies pain  Home Living                          Prior Function            PT Goals (current goals can now be found in the care plan section) Acute Rehab PT Goals Patient Stated Goal: to go home Potential to Achieve Goals: Good Progress towards PT goals: Progressing toward goals    Frequency    Min 3X/week      PT Plan Current plan remains  appropriate    Co-evaluation              AM-PAC PT "6 Clicks" Mobility   Outcome Measure  Help needed turning from your back to your side while in a flat bed without using bedrails?: A Little Help needed moving from lying on your back to sitting on the side of a flat bed without using bedrails?: A Little Help needed moving to and from a bed to a chair (including a wheelchair)?: A Little Help needed standing up from a chair using your arms (e.g., wheelchair or bedside chair)?: A Little Help needed to walk in hospital room?: A Little Help needed climbing 3-5 steps with a railing? : Total 6 Click Score: 16    End of Session Equipment Utilized During Treatment: Gait belt Activity Tolerance: Patient limited by fatigue Patient left: in chair;with call bell/phone within reach Nurse Communication: Mobility status PT Visit Diagnosis: Unsteadiness on feet (R26.81);Muscle weakness (generalized) (M62.81)     Time: 1104-1130 PT Time Calculation (min) (ACUTE ONLY): 26 min  Charges:  $Therapeutic Activity: 23-37 mins                     Wyona Almas, PT, DPT Acute Rehabilitation Services Pager [phone] Office [phone]    Deno Etienne 05/27/2021, 3:36 PM

## 2021-05-27 NOTE — Progress Notes (Signed)
[facility] Progress Note   Subjective:   Feels well.  No uremic symptoms.  No new issues.  UOP at least 550 Cr still trending down.   Denies dyspnea, orthopnea.   Objective Vitals:   05/26/21 0920 05/26/21 2121 05/27/21 0430 05/27/21 0957  BP: (!) 145/79 139/87 (!) 145/71 135/74  Pulse: 94 82 89 83  Resp: 17 18 18 17   Temp: 98.2 F (36.8 C) 98.4 F (36.9 C) 98.7 F (37.1 C) 98.3 F (36.8 C)  TempSrc:   Oral   SpO2: 93% 100% 95% 95%  Weight:   85 kg   Height:       Physical Exam General: well-appearing, no acute distress  HEENT: anicteric sclera, oropharynx clear without lesions CV: normal rate, no murmurs, pitting edema in BLE but also with thigh and sacral edema Lungs: clear to auscultation bilaterally, normal work of breathing Abd: soft, non-tender, non-distended Skin: no visible lesions or rashes Psych: alert, engaged, appropriate mood and affect Musculoskeletal: no obvious deformities Neuro: normal speech, no gross focal deficits   Additional Objective Labs: Basic Metabolic Panel: Recent Labs  Lab 05/25/21 0228 05/26/21 0632 05/27/21 0252  NA 138 140 138  K 4.0 3.9 3.9  CL 109 107 106  CO2 20* 23 22  GLUCOSE 207* 151* 153*  BUN 52* 41* 40*  CREATININE 5.84* 4.77* 4.16*  CALCIUM 8.0* 8.3* 8.4*  PHOS 5.0* 4.2 3.9   Liver Function Tests: Recent Labs  Lab 05/21/21 2002 05/22/21 0603 05/23/21 0010 05/24/21 0438 05/25/21 0228 05/26/21 0632 05/27/21 0252  AST 19  --  18  --   --   --   --   ALT 11  --  11  --   --   --   --   ALKPHOS 81  --  75  --   --   --   --   BILITOT 0.7  --  0.4  --   --   --   --   PROT 7.5  --  6.7  --   --   --   --   ALBUMIN 3.6   < > 3.1*   < > 2.7* 2.9* 2.7*   < > = values in this interval not displayed.   No results for input(s): LIPASE, AMYLASE in the last 168 hours. CBC: Recent Labs  Lab 05/21/21 2002 05/22/21 0603 05/23/21 0010 05/26/21 0632  WBC 5.0 4.3 4.4 7.3  NEUTROABS 3.2  --   --   --    HGB 9.0* 8.2* 8.1* 8.3*  HCT 29.0* 26.5* 26.3* 26.1*  MCV 95.1 97.1 95.6 93.5  PLT 170 133* 144* 124*   Blood Culture    Component Value Date/Time   SDES  05/22/2021 1608    BLOOD RIGHT HAND Performed at [facility], [place] [address], [place], [place] [address]    SDES  05/22/2021 1608    BLOOD LEFT HAND Performed at [facility], [place] [address] [place], [place] [address]    [place]  05/22/2021 1608    BOTTLES DRAWN AEROBIC AND ANAEROBIC Blood Culture adequate volume Performed at [place] [address] [place], [place] [address]    [place]  05/22/2021 1608    BOTTLES DRAWN AEROBIC AND ANAEROBIC Blood Culture results may not be optimal due to an inadequate volume of blood received in culture bottles Performed at [facility], [place] [address] [place], [place] [address]    CULT  05/22/2021 1608    NO GROWTH 5 DAYS Performed at [facility], [place] [address] [place], [place] [address]    CULT  05/22/2021 1608    NO GROWTH 5 DAYS Performed at [facility], [place] [address] [place], [place] [address]    REPTSTATUS 05/27/2021 FINAL 05/22/2021 1608   REPTSTATUS 05/27/2021 FINAL 05/22/2021 1608    Cardiac Enzymes: Recent Labs  Lab 05/22/21 0603  CKTOTAL 70   CBG: Recent Labs  Lab 05/26/21 1151 05/26/21 1629 05/26/21 2121 05/27/21 0640 05/27/21 1132  GLUCAP 198* 173* 196* 148* 179*   Iron Studies: No results for input(s): IRON, TIBC, TRANSFERRIN, FERRITIN in the last 72 hours. @lablastinr3 @ Studies/Results: No results found. Medications:    feeding supplement (NEPRO CARB STEADY)  237 mL Oral Q24H   heparin  5,000 Units Subcutaneous Q8H   insulin aspart  0-6 Units Subcutaneous TID WC   levofloxacin  500 mg Oral Q48H   multivitamin  1 tablet Oral QHS   pravastatin  20 mg Oral Daily   sodium bicarbonate  1,300 mg Oral TID    Assessment/Recommendations: [name] is a/an 81 y.o. female with a past medical history CKD, anemia, HTN, DM2 and recent E coli bacteremia who present w/ AKI and volume overload    AKI on CKD4: Hard to definitively define baseline creatinine.  Seems to be worsening over the past year.  Likely most recent baseline is around 2.5-3.  Presented with creatinine up to nearly 7.  renal US no obstruction. Urinalysis with concerns of ongoing urinary tract infection.  Also with signs of volume overload on exam.  Blood pressures borderline low here.  I think most likely she suffered ATN from NSAID use, ARB use, borderline hypotension. -She has no need for renal replacement therapy and hopefully we're seeing recovery phase with ^d UOP and improving Cr  -No fluids or diuretics at this time.  Continue to monitor volume status closely-  think it will correct slowly on its own -holding ARB -  I would say once crt under 4 I would be comfortable with discharge-  at the pace she is going I think it will be tomorrow-  she will need nephrology follow up at discharge -  I will arrange   -UTI treatment as below   Hyperkalemia: resolved   Metabolic Acidosis: Associated with AKI.  Increased po bicarb. Resolving    UTI/recent E. coli bacteremia: Urinalysis with ongoing concerns of UTI.  Urine cx with GNR, speciation pending; blood cultures NGTD.  Cont ceftriaxone daily   Acute exacerbation of diastolic heart failure: Mildly volume overload on exam with bilateral lower extremity edema.  Holding diuretics for now,    Hypertension: Blood pressure borderline low at this time.  Hold home medications   Anemia: Unclear cause.  CKD likely playing role.   Continue to monitor and transfuse as needed-  has been stable    DM2: Management per primary team      Louis Meckel  05/27/2021, 12:01 PM  [facility] Pager: [phone]

## 2021-05-28 LAB — RENAL FUNCTION PANEL
Albumin: 2.6 g/dL — ABNORMAL LOW (ref 3.5–5.0)
Anion gap: 17 — ABNORMAL HIGH (ref 5–15)
BUN: 38 mg/dL — ABNORMAL HIGH (ref 8–23)
CO2: 24 mmol/L (ref 22–32)
Calcium: 9.1 mg/dL (ref 8.9–10.3)
Chloride: 105 mmol/L (ref 98–111)
Creatinine, Ser: 3.67 mg/dL — ABNORMAL HIGH (ref 0.44–1.00)
GFR, Estimated: 12 mL/min — ABNORMAL LOW (ref >60)
Glucose, Bld: 215 mg/dL — ABNORMAL HIGH (ref 70–99)
Phosphorus: 3.5 mg/dL (ref 2.5–4.6)
Potassium: 3.9 mmol/L (ref 3.5–5.1)
Sodium: 146 mmol/L — ABNORMAL HIGH (ref 135–145)

## 2021-05-28 LAB — GLUCOSE, CAPILLARY
Glucose-Capillary: 190 mg/dL — ABNORMAL HIGH (ref 70–99)
Glucose-Capillary: 208 mg/dL — ABNORMAL HIGH (ref 70–99)

## 2021-05-28 MED ORDER — AMLODIPINE BESYLATE 5 MG PO TABS: 5.0000 mg | ORAL_TABLET | Freq: Every day | ORAL | 0 refills | Status: DC

## 2021-05-28 MED ORDER — LEVOFLOXACIN 500 MG PO TABS: 500.0000 mg | ORAL_TABLET | ORAL | 0 refills | Status: DC

## 2021-05-28 MED ORDER — SODIUM BICARBONATE 650 MG PO TABS: 650.0000 mg | ORAL_TABLET | Freq: Two times a day (BID) | ORAL | Status: DC

## 2021-05-28 MED ORDER — HUMULIN 70/30 KWIKPEN (70-30) 100 UNIT/ML ~~LOC~~ SUPN: 5.0000 [IU] | PEN_INJECTOR | Freq: Every day | SUBCUTANEOUS | 1 refills | Status: DC

## 2021-05-28 MED ORDER — ISOSORBIDE DINITRATE 30 MG PO TABS: 30.0000 mg | ORAL_TABLET | Freq: Two times a day (BID) | ORAL | 1 refills | Status: DC

## 2021-05-28 MED ORDER — AMLODIPINE BESYLATE 5 MG PO TABS
5.0000 mg | ORAL_TABLET | Freq: Every day | ORAL | Status: DC
  Administered 2021-05-28: 5 mg via ORAL
  Filled 2021-05-28: qty 1

## 2021-05-28 MED ORDER — SODIUM BICARBONATE 650 MG PO TABS: 650.0000 mg | ORAL_TABLET | Freq: Two times a day (BID) | ORAL | 1 refills | Status: DC

## 2021-05-28 NOTE — TOC Transition Note (Signed)
Transition of Care [facility]) - CM/SW Discharge Note   Patient Details  Name: [name] MRN: [id number] Date of Birth: [birth year]  [facility]) CM/SW Contact:  Tom-Johnson, Renea Ee, RN Phone Number: 05/28/2021, 12:53 PM   Clinical Narrative:    Patient is scheduled for discharge today. Home health referral with [facility] and information on [facility]. Hospital bed to be delivered to patient's home by [employer]. Denies any other needs. Daughter to transport at discharge.    Final next level of care: [place] Barriers to Discharge: Barriers Resolved   Patient Goals and CMS Choice Patient states their goals for this hospitalization and ongoing recovery are:: To return home CMS Medicare.gov Compare Post Acute Care list provided to:: Patient Choice offered to / list presented to : Patient, Adult Children (Daughter, [name])  Discharge Placement                Patient to be transferred to facility by: Daughter      Discharge Plan and Services   Discharge Planning Services: CM Consult            DME Arranged: Hospital bed   Date DME Agency Contacted: 05/28/21 Time DME Agency Contacted: 1025 Representative spoke with at DME Agency: Freda Munro HH Arranged: PT, OT [facility] Agency: [place] Date [place]: 05/26/21 Time [place]: 1300 Representative spoke with at [place]: Amy  Social Determinants of Health ([place]) Interventions     Readmission Risk Interventions No flowsheet data found.

## 2021-05-28 NOTE — Progress Notes (Signed)
[facility] Progress Note   Subjective:   Feels well.  No uremic symptoms.  No new issues.  UOP 1450 Cr still trending down.   Denies dyspnea, orthopnea. Will be going home today   Objective Vitals:   05/27/21 2059 05/28/21 0420 05/28/21 0420 05/28/21 0809  BP: (!) 149/71  119/67 (!) 147/62  Pulse: 86  92 90  Resp: 18  18 18   Temp: 98.8 F (37.1 C)  (!) 97.4 F (36.3 C)   TempSrc: Oral  Oral   SpO2: 93%  100% 96%  Weight:  83.5 kg    Height:       Physical Exam General: well-appearing, no acute distress  HEENT: anicteric sclera, oropharynx clear without lesions CV: normal rate, no murmurs, pitting edema but improved  Lungs: clear to auscultation bilaterally, normal work of breathing Abd: soft, non-tender, non-distended Skin: no visible lesions or rashes Psych: alert, engaged, appropriate mood and affect Musculoskeletal: no obvious deformities Neuro: normal speech, no gross focal deficits   Additional Objective Labs: Basic Metabolic Panel: Recent Labs  Lab 05/26/21 0632 05/27/21 0252 05/28/21 0259  NA 140 138 146*  K 3.9 3.9 3.9  CL 107 106 105  CO2 23 22 24   GLUCOSE 151* 153* 215*  BUN 41* 40* 38*  CREATININE 4.77* 4.16* 3.67*  CALCIUM 8.3* 8.4* 9.1  PHOS 4.2 3.9 3.5   Liver Function Tests: Recent Labs  Lab 05/21/21 2002 05/22/21 0603 05/23/21 0010 05/24/21 0438 05/26/21 0632 05/27/21 0252 05/28/21 0259  AST 19  --  18  --   --   --   --   ALT 11  --  11  --   --   --   --   ALKPHOS 81  --  75  --   --   --   --   BILITOT 0.7  --  0.4  --   --   --   --   PROT 7.5  --  6.7  --   --   --   --   ALBUMIN 3.6   < > 3.1*   < > 2.9* 2.7* 2.6*   < > = values in this interval not displayed.   No results for input(s): LIPASE, AMYLASE in the last 168 hours. CBC: Recent Labs  Lab 05/21/21 2002 05/22/21 0603 05/23/21 0010 05/26/21 0632  WBC 5.0 4.3 4.4 7.3  NEUTROABS 3.2  --   --   --   HGB 9.0* 8.2* 8.1* 8.3*  HCT 29.0* 26.5* 26.3*  26.1*  MCV 95.1 97.1 95.6 93.5  PLT 170 133* 144* 124*   Blood Culture    Component Value Date/Time   SDES  05/22/2021 1608    BLOOD RIGHT HAND Performed at [facility], [place] [address] [place], [place] [address]    SDES  05/22/2021 1608    BLOOD LEFT HAND Performed at [facility], [place] [address] [place], [place] [address]    [place]  05/22/2021 1608    BOTTLES DRAWN AEROBIC AND ANAEROBIC Blood Culture adequate volume Performed at [place] [address], [place], [place] [address]    [place]  05/22/2021 1608    BOTTLES DRAWN AEROBIC AND ANAEROBIC Blood Culture results may not be optimal due to an inadequate volume of blood received in culture bottles Performed at [facility], [place] [address], [place],  [address]    CULT  05/22/2021 1608    NO GROWTH  5 DAYS Performed at [facility], [place] [address] [place], [place] [address]    CULT  05/22/2021 1608    NO GROWTH 5 DAYS Performed at [facility], [place] [address], [place], [place] [address]    REPTSTATUS 05/27/2021 FINAL 05/22/2021 1608   REPTSTATUS 05/27/2021 FINAL 05/22/2021 1608    Cardiac Enzymes: Recent Labs  Lab 05/22/21 0603  CKTOTAL 70   CBG: Recent Labs  Lab 05/27/21 0640 05/27/21 1132 05/27/21 1615 05/27/21 2100 05/28/21 0737  GLUCAP 148* 179* 222* 231* 190*   Iron Studies: No results for input(s): IRON, TIBC, TRANSFERRIN, FERRITIN in the last 72 hours. @lablastinr3 @ Studies/Results: No results found. Medications:    feeding supplement (NEPRO CARB STEADY)  237 mL Oral Q24H   heparin  5,000 Units Subcutaneous Q8H   insulin aspart  0-6 Units Subcutaneous TID WC   levofloxacin  500 mg Oral Q48H   multivitamin  1 tablet Oral QHS   pravastatin  20 mg Oral Daily   sodium bicarbonate  1,300 mg Oral TID    Assessment/Recommendations: [name] is a/an 81 y.o. female with a past medical  history CKD, anemia, HTN, DM2 and recent E coli bacteremia who present w/ AKI and volume overload    AKI on CKD4: Hard to definitively define baseline creatinine.  Seems to be worsening over the past year.  Likely most recent baseline is around 2.5-3.  Presented with creatinine up to nearly 7.  renal US no obstruction. Urinalysis with concerns of ongoing urinary tract infection.  Also with signs of volume overload on exam.  Blood pressures borderline low here.  I think most likely she suffered ATN from NSAID use, ARB use, borderline hypotension. -She has no need for renal replacement therapy and we have been seeing recovery phase with ^d UOP and improving Cr  -No fluids or diuretics at this time.  Continue to monitor volume status closely-  think it will correct slowly on its own -holding ARB -  I would say once crt under 4 I would be comfortable with discharge-  at the pace she is going I think it will be tomorrow-  she will need nephrology follow up at discharge -  I will arrange   -UTI treatment as below   Hyperkalemia: resolved   Metabolic Acidosis: Associated with AKI.  Increased po bicarb. Resolving    UTI/recent E. coli bacteremia: Urinalysis with ongoing concerns of UTI.  Urine cx with GNR, speciation pending; blood cultures NGTD.  Cont ceftriaxone daily   Acute exacerbation of diastolic heart failure: Mildly volume overload on exam with bilateral lower extremity edema.  Holding diuretics for now,    Hypertension: Blood pressure borderline low at this time.  Holding home medications-  could resume amlodpine 5 but not benicar yet   Anemia:   CKD likely playing role.   Continue to monitor and transfuse as needed-  has been stable    DM2: Management per primary team      Louis Meckel  05/28/2021, 10:02 AM  [facility] Pager: [phone]

## 2021-05-28 NOTE — Progress Notes (Signed)
Occupational Therapy Treatment Patient Details Name: [name] MRN: [id number] DOB: 1941/03/30 Today's Date: 05/28/2021   History of present illness [name] is a 81 y.o. female presented to ED from PCP office with elevated creatinine and found to have acute renal failure and volume overload. Also noted to have bilateral pleural effusions and ascites. PMHx: CKD stage III, anemia, HTN DM2 admitted last month for sepsis from UTI with E. coli bacteremia.   OT comments  Pt presented in bed and unaware about loose BM. Pt required max assist to total in standing position to clean peri area. Pt was able to complete bed mobility with min assist and max tactile cues on how to position self. Pt completed multiple transfer with min assist to min guard from elevated surfaces. Pt currently with functional limitations due to the deficits listed below (see OT Problem List).  Pt will benefit from skilled OT to increase their safety and independence with ADL and functional mobility for ADL to facilitate discharge to venue listed below.     Recommendations for follow up therapy are one component of a multi-disciplinary discharge planning process, led by the attending physician.  Recommendations may be updated based on patient status, additional functional criteria and insurance authorization.    Follow Up Recommendations  Home health OT    Assistance Recommended at Discharge Frequent or constant Supervision/Assistance  Patient can return home with the following  A little help with walking and/or transfers;A little help with bathing/dressing/bathroom;Assistance with cooking/housework;Assistance with feeding;Direct supervision/assist for medications management;Direct supervision/assist for financial management;Assist for transportation   Equipment Recommendations       Recommendations for Other Services      Precautions / Restrictions Precautions Precautions: Fall;Other (comment) Precaution  Comments: loose BM Restrictions Weight Bearing Restrictions: No       Mobility Bed Mobility Overal bed mobility: Needs Assistance Bed Mobility: Supine to Sit Rolling: Min assist Sidelying to sit: Min assist       General bed mobility comments: increase time with hand over hand    Transfers Overall transfer level: Needs assistance Equipment used: Rolling walker (2 wheels) Transfers: Sit to/from Stand Sit to Stand: Min guard, From elevated surface           General transfer comment: Pt requiresting assist for first transfer but then was able to complete with min guard following cues     Balance Overall balance assessment: Needs assistance Sitting-balance support: No upper extremity supported, Feet supported Sitting balance-Leahy Scale: Fair     Standing balance support: During functional activity, Bilateral upper extremity supported Standing balance-Leahy Scale: Poor Standing balance comment: Pt able to stand for about 2-3 min intervals                           ADL either performed or assessed with clinical judgement   ADL Overall ADL's : Needs assistance/impaired Eating/Feeding: Independent;Sitting   Grooming: Set up;Sitting   Upper Body Bathing: Set up;Sitting   Lower Body Bathing: Minimal assistance;Sit to/from stand   Upper Body Dressing : Set up;Sitting   Lower Body Dressing: Minimal assistance;Sit to/from stand   Toilet Transfer: Min guard;Cueing for safety;Cueing for sequencing;BSC/3in1   Toileting- Water quality scientist and Hygiene: Sit to/from stand;Maximal assistance       Functional mobility during ADLs: Min guard;Rolling walker (2 wheels) General ADL Comments: Attempted to engage in toileting and cleaning peri area as noted in loose stool in bed level. Pt was unaware. Pt then required  max assist as cleaned front of peri area with min assist in sitting then total posterior portion. [name] was called in regards about concern of  hygigne/skin break down and they reported they would be able to assist as needed for patient.    Extremity/Trunk Assessment Upper Extremity Assessment Upper Extremity Assessment: Generalized weakness   Lower Extremity Assessment Lower Extremity Assessment: Defer to PT evaluation        Vision   Vision Assessment?: No apparent visual deficits   Perception     Praxis      Cognition Arousal/Alertness: Awake/alert Behavior During Therapy: WFL for tasks assessed/performed Overall Cognitive Status: No family/caregiver present to determine baseline cognitive functioning                                 General Comments: slower processing, increased time and repetition to follow 1 step commands, noted unaware on how to use phone in session, decrease in follow through with instructions        Exercises      Shoulder Instructions       General Comments      Pertinent Vitals/ Pain       Pain Assessment Pain Assessment: No/denies pain  Home Living                                          Prior Functioning/Environment              Frequency  Min 2X/week        Progress Toward Goals  OT Goals(current goals can now be found in the care plan section)  Progress towards OT goals: Progressing toward goals  Acute Rehab OT Goals Patient Stated Goal: to get strong OT Goal Formulation: With patient Time For Goal Achievement: 06/08/21 Potential to Achieve Goals: Good ADL Goals Pt Will Perform Grooming: with modified independence;standing Pt Will Perform Lower Body Bathing: with modified independence;sit to/from stand Pt Will Perform Lower Body Dressing: with modified independence;sit to/from stand Pt Will Transfer to Toilet: with modified independence;ambulating;bedside commode Pt Will Perform Toileting - Clothing Manipulation and hygiene: with modified independence;sit to/from stand Additional ADL Goal #1: Pt will be Mod I in and out  of bed (HOB flat, no rail, increased time)  Plan Discharge plan remains appropriate    Co-evaluation                 AM-PAC OT "6 Clicks" Daily Activity     Outcome Measure   Help from another person eating meals?: None Help from another person taking care of personal grooming?: A Little Help from another person toileting, which includes using toliet, bedpan, or urinal?: A Lot Help from another person bathing (including washing, rinsing, drying)?: A Lot Help from another person to put on and taking off regular upper body clothing?: A Little Help from another person to put on and taking off regular lower body clothing?: A Lot 6 Click Score: 16    End of Session Equipment Utilized During Treatment: Rolling walker (2 wheels);Gait belt  OT Visit Diagnosis: Unsteadiness on feet (R26.81);Muscle weakness (generalized) (M62.81)   Activity Tolerance Patient tolerated treatment well   Patient Left in chair;with call bell/phone within reach;with chair alarm set   Nurse Communication  (loose bms)        Time: 9163-8466 OT Time  Calculation (min): 45 min  Charges: OT General Charges $OT Visit: 1 Visit OT Treatments $Self Care/Home Management : 38-52 mins  Joeseph Amor OTR/L  Acute Rehab Services  [phone] office number [phone] pager number   Joeseph Amor 05/28/2021, 10:54 AM

## 2021-05-28 NOTE — Progress Notes (Signed)
Patient discharge teaching given, including activity, diet, follow-up appoints, and medications. Patient verbalized understanding of all discharge instructions. IV access was d/c'd. Vitals are stable. Skin is intact except as charted in most recent assessments. Pt to be escorted out by NT, to be driven home by family. 

## 2021-05-28 NOTE — Progress Notes (Signed)
Physical Therapy Treatment Patient Details Name: [name] MRN: [id number] DOB: July 05, 1940 Today's Date: 05/28/2021   History of Present Illness [name] is a 81 y.o. female presented to ED from PCP office with elevated creatinine and found to have acute renal failure and volume overload. Also noted to have bilateral pleural effusions and ascites. PMHx: CKD stage III, anemia, HTN DM2 admitted last month for sepsis from UTI with E. coli bacteremia.    PT Comments    Pt tolerates treatment well, ambulating for multiple bouts of short distances. Pt benefits from verbal cues for transfer technique but does not requires physical assistance at this time. Pt will benefit from continued progressive ambulation to aide in improving muscular endurance. PT continues to recommend HHPT at the time of discharge. Pt will likely require assistance from family for all ADLs and all out of bed mobility.   Recommendations for follow up therapy are one component of a multi-disciplinary discharge planning process, led by the attending physician.  Recommendations may be updated based on patient status, additional functional criteria and insurance authorization.  Follow Up Recommendations  Home health PT     Assistance Recommended at Discharge Frequent or constant Supervision/Assistance  Patient can return home with the following A little help with walking and/or transfers;A little help with bathing/dressing/bathroom;Help with stairs or ramp for entrance;Assist for transportation;Assistance with cooking/housework   Equipment Recommendations  Hospital bed    Recommendations for Other Services       Precautions / Restrictions Precautions Precautions: Fall;Other (comment) Precaution Comments: loose BM Restrictions Weight Bearing Restrictions: No     Mobility  Bed Mobility                    Transfers Overall transfer level: Needs assistance Equipment used: Rolling walker (2  wheels) Transfers: Sit to/from Stand Sit to Stand: Min guard, Supervision           General transfer comment: verbal cues for hand placement and trunk flexion when standing from lower surface. Pt is able to retain these cues with 2nd stand from bed    Ambulation/Gait Ambulation/Gait assistance: Min guard Gait Distance (Feet): 20 Feet (20' x 2, 15' x 1) Assistive device: Rolling walker (2 wheels) Gait Pattern/deviations: Step-through pattern, Shuffle Gait velocity: decreased Gait velocity interpretation: <1.8 ft/sec, indicate of risk for recurrent falls   General Gait Details: pt with shuffling steps initially, with verbal cues is able to clear feet bilaterally. Pt with significantly reduced gait speed with initial ambulation bout which improves with 2nd and 3rd trial   Stairs             Wheelchair Mobility    Modified Rankin (Stroke Patients Only)       Balance Overall balance assessment: Needs assistance Sitting-balance support: No upper extremity supported, Feet supported Sitting balance-Leahy Scale: Fair     Standing balance support: Bilateral upper extremity supported, Reliant on assistive device for balance Standing balance-Leahy Scale: Poor                              Cognition Arousal/Alertness: Awake/alert Behavior During Therapy: WFL for tasks assessed/performed Overall Cognitive Status: No family/caregiver present to determine baseline cognitive functioning                                 General Comments: slowed processing  Exercises      General Comments General comments (skin integrity, edema, etc.): VSS on RA      Pertinent Vitals/Pain Pain Assessment Pain Assessment: No/denies pain    Home Living                          Prior Function            PT Goals (current goals can now be found in the care plan section) Acute Rehab PT Goals Patient Stated Goal: to go home Progress towards  PT goals: Progressing toward goals    Frequency    Min 3X/week      PT Plan Current plan remains appropriate    Co-evaluation              AM-PAC PT "6 Clicks" Mobility   Outcome Measure  Help needed turning from your back to your side while in a flat bed without using bedrails?: A Little Help needed moving from lying on your back to sitting on the side of a flat bed without using bedrails?: A Little Help needed moving to and from a bed to a chair (including a wheelchair)?: A Little Help needed standing up from a chair using your arms (e.g., wheelchair or bedside chair)?: A Little Help needed to walk in hospital room?: A Little Help needed climbing 3-5 steps with a railing? : Total 6 Click Score: 16    End of Session   Activity Tolerance: Patient tolerated treatment well Patient left: in chair;with call bell/phone within reach;with chair alarm set Nurse Communication: Mobility status PT Visit Diagnosis: Unsteadiness on feet (R26.81);Muscle weakness (generalized) (M62.81)     Time: 2563-8937 PT Time Calculation (min) (ACUTE ONLY): 19 min  Charges:  $Gait Training: 8-22 mins                     Zenaida Niece, PT, DPT Acute Rehabilitation Pager: [phone] Office [place] 05/28/2021, 12:31 PM

## 2021-05-28 NOTE — Progress Notes (Signed)
Patient daughter has Doctor's appointment and will pick patient up afterwards. Tentative discharge will be 2 pm. Daughter requests to be present for discharge paperwork discussion. Will discharge at earliest convenience.

## 2021-05-28 NOTE — Discharge Summary (Signed)
Physician Discharge Summary  [name] TKW:[id number] DOB: Jun 18, 1940 DOA: 05/21/2021  PCP: Trey Sailors, PA  Admit date: 05/21/2021 Discharge date: 05/28/2021  Admitted From: Home  Disposition:  Home   Recommendations for Outpatient Follow-up:  Follow up with PCP in 1-2 weeks Please obtain BMP/CBC in one week Monitor BP, volume status.  Follow up with nephrology for further evaluation and consideration of resumption of lasix.   Home Health: Yes.  Discharge Condition: Stable.  CODE STATUS: Full code Diet recommendation: Heart Healthy   Brief/Interim Summary: 81 year old with past medical history significant for CKD stage IIIb, anemia, CKD, hypertension, diabetes type 2 recent admission December 2022 with sepsis related to E. coli UTI and bacteremia was found to have significantly elevated creatinine during her routine check by her PCP and was therefore directed to the ED.  In the ER patient was found to have a creatinine of 6.7.  Prior creatinine 2.4 months ago, 3.2 a week prior.  1-AKI on CKD stage IIIb: -Renal ultrasound negative for hydronephrosis -Recent cr 2.5--3 -Secondary to ATN from NSAID use, ARB use, borderline hypotension -Nephrology following -Treating for UTI as well, urine culture growing Pseudomonas and Enterococcus, antibiotic changed to Levaquin to cover both. Will provide one more day antibiotics.  -Creatinine trending down: 4.7---4.6---3.6 -Continue to hold ARB, lasix at discharge.    2-Metabolic Acidosis: Continue with sodium bicarb.   Hyperkalemia: Treated with Lokelma. Resolved.    Bilateral pleural effusion ascites Holding Diuretic due to AKI   Diabetes type 2: Continue with a sliding scale insulin Resume lower dose NPH.   Hypertension: Resume low dose Norvasc and Imdur.    Anemia of CKD: Stable Hb stable, when last checked.    Remote history of A. fib: Not on anticoagulation Mild thrombocytopenia. Stable.      Nutrition Problem:  Increased nutrient needs Etiology: acute illness      Discharge Diagnoses:  Principal Problem:   ARF (acute renal failure) ([place]) Active Problems:   Essential hypertension   DM (diabetes mellitus), type 2 with renal complications (H[facility])   CKD (chronic kidney disease), stage V [facility])    Discharge Instructions  Discharge Instructions     Diet - low sodium heart healthy   Complete by: As directed    Increase activity slowly   Complete by: As directed       Allergies as of 05/28/2021       Reactions   Amlodipine Swelling        Medication List     STOP taking these medications    cephALEXin 500 MG capsule Commonly known as: KEFLEX   diltiazem 120 MG 24 hr capsule Commonly known as: CARDIZEM CD   furosemide 20 MG tablet Commonly known as: LASIX   glucose blood test strip   hydrALAZINE 50 MG tablet Commonly known as: APRESOLINE   olmesartan 20 MG tablet Commonly known as: BENICAR   orphenadrine 100 MG tablet Commonly known as: NORFLEX       TAKE these medications    Accu-Chek FastClix Lancets Misc See admin instructions.   Accu-Chek Guide Me w/Device Kit See admin instructions.   amLODipine 5 MG tablet Commonly known as: NORVASC Take 1 tablet (5 mg total) by mouth daily. What changed:  medication strength how much to take   BD Pen Needle Nano 2nd Gen 32G X 4 MM Misc Generic drug: Insulin Pen Needle 3 (three) times daily. as directed   HumuLIN 70/30 KwikPen (70-30) 100 UNIT/ML KwikPen Generic drug: insulin isophane &  regular human KwikPen Inject 5 Units into the skin daily. What changed: how much to take   isosorbide dinitrate 30 MG tablet Commonly known as: ISORDIL Take 1 tablet (30 mg total) by mouth 2 (two) times daily. What changed: when to take this   levofloxacin 500 MG tablet Commonly known as: LEVAQUIN Take 1 tablet (500 mg total) by mouth every other day. Start taking on: May 29, 2021   MUSCLE RUB EX Apply 1  application topically daily as needed (leg cramps).   pravastatin 20 MG tablet Commonly known as: PRAVACHOL Take 1 tablet (20 mg total) by mouth daily.   sodium bicarbonate 650 MG tablet Take 1 tablet (650 mg total) by mouth 2 (two) times daily.        Allergies  Allergen Reactions   Amlodipine Swelling    Consultations: Nephrology    Procedures/Studies: US RENAL  Result Date: 05/22/2021 CLINICAL DATA:  AKI. EXAM: RENAL / URINARY TRACT ULTRASOUND COMPLETE COMPARISON:  04/28/2021. FINDINGS: Right Kidney: Renal measurements: 9.3 x 3.9 x 3.7 cm = volume: 71.0 mL. Echogenicity within normal limits. No mass or hydronephrosis visualized. Left Kidney: Renal measurements: 9.1 x 4.5 x 5.2 cm = volume: 110.1 mL. Echogenicity within normal limits. No mass or hydronephrosis visualized. Bladder: Appears normal for degree of bladder distention. Other: There are small bilateral pleural effusions. Ascites is noted in the pelvis. Evaluation of the left kidney is limited due to patient's body habitus. IMPRESSION: 1. Normal evaluation of the kidneys and bladder. 2. Bilateral pleural effusions. 3. Ascites in the pelvis. Electronically Signed   By: Brett Fairy M.D.   On: 05/22/2021 01:02   NM Hepato W/EF  Result Date: 05/01/2021 CLINICAL DATA:  Right upper quadrant abdominal pain. EXAM: NUCLEAR MEDICINE HEPATOBILIARY IMAGING WITH GALLBLADDER EF TECHNIQUE: Sequential images of the abdomen were obtained out to 60 minutes following intravenous administration of radiopharmaceutical. After oral ingestion of Ensure, gallbladder ejection fraction was determined. At 60 min, normal ejection fraction is greater than 33%. RADIOPHARMACEUTICALS:  5.4 mCi Tc-26m Choletec IV COMPARISON:  April 28, 2021. FINDINGS: Prompt uptake and biliary excretion of activity by the liver is seen. Gallbladder activity is visualized, consistent with patency of cystic duct. Biliary activity passes into small bowel, consistent with  patent common bile duct. Calculated gallbladder ejection fraction is 0%. No pain was reported with Ensure administration. 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 Electronically Signed   By: JMarijo ConceptionM.D.   On: 05/01/2021 10:49   DG CHEST PORT 1 VIEW  Result Date: 05/22/2021 CLINICAL DATA:  Shortness of breath, weakness. EXAM: PORTABLE CHEST 1 VIEW COMPARISON:  04/25/2021. FINDINGS: The heart enlarged and mediastinal contours are within normal limits. Atherosclerotic calcification of the aorta is noted. Hazy opacity is seen at the left lung base. The right lung is clear. No pneumothorax. IMPRESSION: 1. Hazy opacity at the left lung base, which may be artifactual due to patient's body habitus. A small pleural effusion with atelectasis or infiltrate can not be excluded. 2. Cardiomegaly. Electronically Signed   By: LBrett FairyM.D.   On: 05/22/2021 01:14   UKoreaAbdomen Limited RUQ (LIVER/GB)  Result Date: 04/28/2021 CLINICAL DATA:  Abnormal LFTs EXAM: ULTRASOUND ABDOMEN LIMITED RIGHT UPPER QUADRANT COMPARISON:  CT 04/25/2021 FINDINGS: Gallbladder: Gallbladder wall thickening up to 8.9 mm. Moderate pericholecystic fluid. Sonographer describes no sonographic Murphy's sign however.  Layering echogenic material in the fundus possibly small stones or sludge. Common bile duct: Diameter: 9 mm Liver: No focal lesion identified. Within normal limits in parenchymal echogenicity. Portal vein is patent on color Doppler imaging with normal direction of blood flow towards the liver. Other: Small volume abdominal ascites. Right pleural effusion noted. IMPRESSION: 1. Nonspecific but marked gallbladder wall thickening, with layering small stones or sludge. 2. Dilated common bile duct. 3. Small volume abdominal ascites including pericholecystic fluid, and right  pleural effusion incidentally noted. Electronically Signed   By: Lucrezia Europe M.D.   On: 04/28/2021 17:19     Subjective: She is feeling well   Discharge Exam: Vitals:   05/28/21 0420 05/28/21 0809  BP: 119/67 (!) 147/62  Pulse: 92 90  Resp: 18 18  Temp: (!) 97.4 F (36.3 C)   SpO2: 100% 96%     General: Pt is alert, awake, not in acute distress Cardiovascular: RRR, S1/S2 +, no rubs, no gallops Respiratory: CTA bilaterally, no wheezing, no rhonchi Abdominal: Soft, NT, ND, bowel sounds + Extremities: no edema, no cyanosis    The results of significant diagnostics from this hospitalization (including imaging, microbiology, ancillary and laboratory) are listed below for reference.     Microbiology: Recent Results (from the past 240 hour(s))  Resp Panel by RT-PCR (Flu A&B, Covid) Nasopharyngeal Swab     Status: None   Collection Time: 05/21/21 10:21 PM   Specimen: Nasopharyngeal Swab; Nasopharyngeal(NP) swabs in vial transport medium  Result Value Ref Range Status   SARS Coronavirus 2 by RT PCR NEGATIVE NEGATIVE Final    Comment: (NOTE) SARS-CoV-2 target nucleic acids are NOT DETECTED.  The SARS-CoV-2 RNA is generally detectable in upper respiratory specimens during the acute phase of infection. The lowest concentration of SARS-CoV-2 viral copies this assay can detect is 138 copies/mL. A negative result does not preclude SARS-Cov-2 infection and should not be used as the sole basis for treatment or other patient management decisions. A negative result may occur with  improper specimen collection/handling, submission of specimen other than nasopharyngeal swab, presence of viral mutation(s) within the areas targeted by this assay, and inadequate number of viral copies(<138 copies/mL). A negative result must be combined with clinical observations, patient history, and epidemiological information. The expected result is Negative.  Fact Sheet for Patients:   EntrepreneurPulse.com.au  Fact Sheet for Healthcare Providers:  IncredibleEmployment.be  This test is no t yet approved or cleared by the [place] FDA and  has been authorized for detection and/or diagnosis of SARS-CoV-2 by FDA under an Emergency Use Authorization (EUA). This EUA will remain  in effect (meaning this test can be used) for the duration of the COVID-19 declaration under Section 564(b)(1) of the Act, 21 U.S.C.section 360bbb-3(b)(1), unless the authorization is terminated  or revoked sooner.       Influenza A by PCR NEGATIVE NEGATIVE Final   Influenza B by PCR NEGATIVE NEGATIVE Final    Comment: (NOTE) The Xpert Xpress SARS-CoV-2/FLU/RSV plus assay is intended as an aid in the diagnosis of influenza from Nasopharyngeal swab specimens and should not be used as a sole basis for treatment. Nasal washings and aspirates are unacceptable for Xpert Xpress SARS-CoV-2/FLU/RSV testing.  Fact Sheet for Patients: EntrepreneurPulse.com.au  Fact Sheet for Healthcare Providers: IncredibleEmployment.be  This test is not yet approved or cleared by the [place] FDA and has been authorized for detection and/or diagnosis of SARS-CoV-2 by FDA under an Emergency Use Authorization (EUA). This EUA will remain in  effect (meaning this test can be used) for the duration of the COVID-19 declaration under Section 564(b)(1) of the Act, 21 U.S.C. section 360bbb-3(b)(1), unless the authorization is terminated or revoked.  Performed at [facility], [place] [address], [place], [place] [address]   Urine Culture     Status: Abnormal   Collection Time: 05/22/21  6:03 AM   Specimen: Urine, Clean Catch  Result Value Ref Range Status   Specimen Description URINE, CLEAN CATCH  Final   Special Requests NONE  Final   Culture (A)  Final    70,000 COLONIES/mL PSEUDOMONAS AERUGINOSA 70,000 COLONIES/mL  ENTEROCOCCUS CASSELIFLAVUS    Report Status 05/25/2021 FINAL  Final   Organism ID, Bacteria PSEUDOMONAS AERUGINOSA (A)  Final   Organism ID, Bacteria ENTEROCOCCUS CASSELIFLAVUS (A)  Final      Susceptibility   Enterococcus casseliflavus - MIC*    AMPICILLIN <=2 SENSITIVE Sensitive     LEVOFLOXACIN 1 SENSITIVE Sensitive     NITROFURANTOIN <=16 SENSITIVE Sensitive     VANCOMYCIN RESISTANT Resistant     LINEZOLID Value in next row Sensitive      2 SENSITIVEPerformed at [place] [address], [place], [place] [address]    * 70,000 COLONIES/mL ENTEROCOCCUS CASSELIFLAVUS   Pseudomonas aeruginosa - MIC*    CEFTAZIDIME Value in next row Sensitive      2 SENSITIVEPerformed at [place] [address], [place], [place] [address]    CIPROFLOXACIN Value in next row Sensitive      2 SENSITIVEPerformed at [place] [address], [place], [place] [address]    GENTAMICIN Value in next row Sensitive      2 SENSITIVEPerformed at [place] [address], [place], [place] [address]    IMIPENEM Value in next row Sensitive      2 SENSITIVEPerformed at [place] [address] [place], [place] [address]    PIP/TAZO Value in next row Sensitive      2 SENSITIVEPerformed at [place] [address] [place], [place] [address]    CEFEPIME Value in next row Sensitive      2 SENSITIVEPerformed at [place] [address], [place], [place] [address]    * 70,000 COLONIES/mL PSEUDOMONAS AERUGINOSA  Culture, blood (routine x 2)     Status: None   Collection Time: 05/22/21  4:08 PM   Specimen: BLOOD RIGHT HAND  Result Value Ref Range Status   Specimen Description   Final    BLOOD RIGHT HAND Performed at [place] [address], [place], [place] [address]    Special Requests   Final    BOTTLES DRAWN AEROBIC AND ANAEROBIC Blood Culture adequate volume Performed at [place] [address] [place], [place] [address]    Culture   Final    NO GROWTH 5 DAYS Performed at [facility], [place] [address], [place], [place] [address]    Report Status 05/27/2021 FINAL  Final  Culture, blood (routine x 2)     Status: None   Collection Time: 05/22/21  4:08 PM   Specimen: BLOOD LEFT HAND  Result Value Ref Range Status   Specimen Description   Final    BLOOD LEFT HAND Performed at [place] [address], [place], [place] [address]    Special Requests   Final    BOTTLES DRAWN AEROBIC AND  ANAEROBIC Blood Culture results may not be optimal due to an inadequate volume of blood received in culture bottles Performed at [facility], [place] [address], [place], [place] [address]    Culture   Final    NO GROWTH 5 DAYS Performed at [facility], [place] [address], [place], [place] [address]    Report Status 05/27/2021 FINAL  Final     Labs: BNP (last 3 results) Recent Labs    01/16/21 1755 02/03/21 1448 05/12/21 1311  BNP 596.2* 640.9* CANCELED   Basic Metabolic Panel: Recent Labs  Lab 05/21/21 2002 05/22/21 0603 05/23/21 0010 05/24/21 0438 05/25/21 0228 05/26/21 0632 05/27/21 0252 05/28/21 0259  NA 138   < > 135 139 138 140 138 146*  K 5.0   < > 5.0 4.4 4.0 3.9 3.9 3.9  CL 98   < > 106 108 109 107 106 105  CO2 17*   < > 16* 20* 20* '23 22 24  ' GLUCOSE 108*   < > 248* 195* 207* 151* 153* 215*  BUN 50*   < > 49* 54* 52* 41* 40* 38*  CREATININE 6.77*   < > 7.16* 6.70* 5.84* 4.77* 4.16* 3.67*  CALCIUM 8.7*   < > 8.4* 8.1* 8.0* 8.3* 8.4* 9.1  MG 2.9*  --  2.5*  --   --   --   --   --   PHOS 5.5*   < > 5.9* 5.3* 5.0* 4.2 3.9 3.5   < > = values in this interval not displayed.   Liver Function Tests: Recent Labs  Lab 05/21/21 2002 05/22/21 4132 05/23/21 0010 05/24/21 4401 05/25/21 0228 05/26/21 0272 05/27/21 0252 05/28/21 0259  AST 19  --  18  --   --   --   --   --   ALT 11  --  11  --   --   --   --   --   ALKPHOS  81  --  75  --   --   --   --   --   BILITOT 0.7  --  0.4  --   --   --   --   --   PROT 7.5  --  6.7  --   --   --   --   --   ALBUMIN 3.6   < > 3.1* 2.8* 2.7* 2.9* 2.7* 2.6*   < > = values in this interval not displayed.   No results for input(s): LIPASE, AMYLASE in the last 168 hours. No results for input(s): AMMONIA in the last 168 hours. CBC: Recent Labs  Lab 05/21/21 2002 05/22/21 0603 05/23/21 0010 05/26/21 0632  WBC 5.0 4.3 4.4 7.3  NEUTROABS 3.2  --   --   --   HGB 9.0* 8.2* 8.1* 8.3*  HCT 29.0* 26.5* 26.3* 26.1*  MCV 95.1 97.1 95.6 93.5  PLT 170 133* 144* 124*   Cardiac Enzymes: Recent Labs  Lab 05/22/21 0603  CKTOTAL 70   BNP: Invalid input(s): POCBNP CBG: Recent Labs  Lab 05/27/21 0640 05/27/21 1132 05/27/21 1615 05/27/21 2100 05/28/21 0737  GLUCAP 148* 179* 222* 231* 190*   D-Dimer No results for input(s): DDIMER in the last 72 hours. Hgb A1c No results for input(s): HGBA1C in the last 72 hours. Lipid Profile No results for input(s): CHOL, HDL, LDLCALC, TRIG, CHOLHDL, LDLDIRECT in the last 72 hours. Thyroid function studies No results for input(s): TSH, T4TOTAL, T3FREE, THYROIDAB  in the last 72 hours.  Invalid input(s): FREET3 Anemia work up No results for input(s): VITAMINB12, FOLATE, FERRITIN, TIBC, IRON, RETICCTPCT in the last 72 hours. Urinalysis    Component Value Date/Time   COLORURINE YELLOW 05/22/2021 0603   APPEARANCEUR CLOUDY (A) 05/22/2021 0603   LABSPEC 1.017 05/22/2021 0603   PHURINE 5.0 05/22/2021 0603   GLUCOSEU NEGATIVE 05/22/2021 0603   HGBUR NEGATIVE 05/22/2021 0603   BILIRUBINUR NEGATIVE 05/22/2021 0603   KETONESUR 5 (A) 05/22/2021 0603   PROTEINUR 100 (A) 05/22/2021 0603   NITRITE NEGATIVE 05/22/2021 0603   LEUKOCYTESUR LARGE (A) 05/22/2021 0603   Sepsis Labs Invalid input(s): PROCALCITONIN,  WBC,  LACTICIDVEN Microbiology Recent Results (from the past 240 hour(s))  Resp Panel by RT-PCR (Flu A&B, Covid)  Nasopharyngeal Swab     Status: None   Collection Time: 05/21/21 10:21 PM   Specimen: Nasopharyngeal Swab; Nasopharyngeal(NP) swabs in vial transport medium  Result Value Ref Range Status   SARS Coronavirus 2 by RT PCR NEGATIVE NEGATIVE Final    Comment: (NOTE) SARS-CoV-2 target nucleic acids are NOT DETECTED.  The SARS-CoV-2 RNA is generally detectable in upper respiratory specimens during the acute phase of infection. The lowest concentration of SARS-CoV-2 viral copies this assay can detect is 138 copies/mL. A negative result does not preclude SARS-Cov-2 infection and should not be used as the sole basis for treatment or other patient management decisions. A negative result may occur with  improper specimen collection/handling, submission of specimen other than nasopharyngeal swab, presence of viral mutation(s) within the areas targeted by this assay, and inadequate number of viral copies(<138 copies/mL). A negative result must be combined with clinical observations, patient history, and epidemiological information. The expected result is Negative.  Fact Sheet for Patients:  EntrepreneurPulse.com.au  Fact Sheet for Healthcare Providers:  IncredibleEmployment.be  This test is no t yet approved or cleared by the [place] FDA and  has been authorized for detection and/or diagnosis of SARS-CoV-2 by FDA under an Emergency Use Authorization (EUA). This EUA will remain  in effect (meaning this test can be used) for the duration of the COVID-19 declaration under Section 564(b)(1) of the Act, 21 U.S.C.section 360bbb-3(b)(1), unless the authorization is terminated  or revoked sooner.       Influenza A by PCR NEGATIVE NEGATIVE Final   Influenza B by PCR NEGATIVE NEGATIVE Final    Comment: (NOTE) The Xpert Xpress SARS-CoV-2/FLU/RSV plus assay is intended as an aid in the diagnosis of influenza from Nasopharyngeal swab specimens and should not be  used as a sole basis for treatment. Nasal washings and aspirates are unacceptable for Xpert Xpress SARS-CoV-2/FLU/RSV testing.  Fact Sheet for Patients: EntrepreneurPulse.com.au  Fact Sheet for Healthcare Providers: IncredibleEmployment.be  This test is not yet approved or cleared by the [place] FDA and has been authorized for detection and/or diagnosis of SARS-CoV-2 by FDA under an Emergency Use Authorization (EUA). This EUA will remain in effect (meaning this test can be used) for the duration of the COVID-19 declaration under Section 564(b)(1) of the Act, 21 U.S.C. section 360bbb-3(b)(1), unless the authorization is terminated or revoked.  Performed at [facility], [place] [address], [place], [place] [address]   Urine Culture     Status: Abnormal   Collection Time: 05/22/21  6:03 AM   Specimen: Urine, Clean Catch  Result Value Ref Range Status   Specimen Description URINE, CLEAN CATCH  Final   Special Requests NONE  Final   Culture (A)  Final  70,000 COLONIES/mL PSEUDOMONAS AERUGINOSA 70,000 COLONIES/mL ENTEROCOCCUS CASSELIFLAVUS    Report Status 05/25/2021 FINAL  Final   Organism ID, Bacteria PSEUDOMONAS AERUGINOSA (A)  Final   Organism ID, Bacteria ENTEROCOCCUS CASSELIFLAVUS (A)  Final      Susceptibility   Enterococcus casseliflavus - MIC*    AMPICILLIN <=2 SENSITIVE Sensitive     LEVOFLOXACIN 1 SENSITIVE Sensitive     NITROFURANTOIN <=16 SENSITIVE Sensitive     VANCOMYCIN RESISTANT Resistant     LINEZOLID Value in next row Sensitive      2 SENSITIVEPerformed at [place] [address], [place], [place] [address]    * 70,000 COLONIES/mL ENTEROCOCCUS CASSELIFLAVUS   Pseudomonas aeruginosa - MIC*    CEFTAZIDIME Value in next row Sensitive      2 SENSITIVEPerformed at [place] [address] [place], [place] [address]    CIPROFLOXACIN Value in next row Sensitive      2  SENSITIVEPerformed at [place] [address], [place], [place] [address]    GENTAMICIN Value in next row Sensitive      2 SENSITIVEPerformed at [place] [address], [place], [place] [address]    IMIPENEM Value in next row Sensitive      2 SENSITIVEPerformed at [place] [address], [place], [place] [address]    PIP/TAZO Value in next row Sensitive      2 SENSITIVEPerformed at [place] [address] [place], [place] [address]    CEFEPIME Value in next row Sensitive      2 SENSITIVEPerformed at [place] [address] [place], [place] [address]    * 70,000 COLONIES/mL PSEUDOMONAS AERUGINOSA  Culture, blood (routine x 2)     Status: None   Collection Time: 05/22/21  4:08 PM   Specimen: BLOOD RIGHT HAND  Result Value Ref Range Status   Specimen Description   Final    BLOOD RIGHT HAND Performed at [place] [address] [place], [place] [address]    Special Requests   Final    BOTTLES DRAWN AEROBIC AND ANAEROBIC Blood Culture adequate volume Performed at [place] [address], [place], [place] [address]    Culture   Final    NO GROWTH 5 DAYS Performed at [facility], [place] [address], [place], [place] [address]    Report Status 05/27/2021 FINAL  Final  Culture, blood (routine x 2)     Status: None   Collection Time: 05/22/21  4:08 PM   Specimen: BLOOD LEFT HAND  Result Value Ref Range Status   Specimen Description   Final    BLOOD LEFT HAND Performed at [place] [address], [place], [place] [address]    Special Requests   Final    BOTTLES DRAWN AEROBIC AND ANAEROBIC Blood Culture results may not be optimal due to an inadequate volume of blood received in culture bottles Performed at [place] [address], [place], [place] [address]    Culture   Final    NO GROWTH 5 DAYS Performed at [facility],  [place] [address], [place],  [address]    Report Status 05/27/2021 FINAL  Final     Time coordinating discharge: 40 minutes  SIGNED:   Elmarie Shiley, MD  [facility]

## 2021-06-01 ENCOUNTER — Emergency Department (HOSPITAL_COMMUNITY)
Admission: EM | Admit: 2021-06-01 | Discharge: 2021-06-02 | Disposition: A | Discharge status: Home / Self Care | Payer: Medicare HMO | Attending: Emergency Medicine | Admitting: Emergency Medicine

## 2021-06-01 ENCOUNTER — Encounter (HOSPITAL_COMMUNITY): Payer: Self-pay | Admitting: Emergency Medicine

## 2021-06-01 ENCOUNTER — Other Ambulatory Visit: Payer: Self-pay

## 2021-06-01 ENCOUNTER — Emergency Department (HOSPITAL_COMMUNITY): Payer: Medicare HMO

## 2021-06-01 DIAGNOSIS — R0602 Shortness of breath: Secondary | ICD-10-CM | POA: Diagnosis present

## 2021-06-01 DIAGNOSIS — Z5321 Procedure and treatment not carried out due to patient leaving prior to being seen by health care provider: Secondary | ICD-10-CM | POA: Insufficient documentation

## 2021-06-01 LAB — BASIC METABOLIC PANEL
Anion gap: 13 (ref 5–15)
BUN: 45 mg/dL — ABNORMAL HIGH (ref 8–23)
CO2: 21 mmol/L — ABNORMAL LOW (ref 22–32)
Calcium: 8.6 mg/dL — ABNORMAL LOW (ref 8.9–10.3)
Chloride: 102 mmol/L (ref 98–111)
Creatinine, Ser: 5.1 mg/dL — ABNORMAL HIGH (ref 0.44–1.00)
GFR, Estimated: 8 mL/min — ABNORMAL LOW (ref >60)
Glucose, Bld: 289 mg/dL — ABNORMAL HIGH (ref 70–99)
Potassium: 4.2 mmol/L (ref 3.5–5.1)
Sodium: 136 mmol/L (ref 135–145)

## 2021-06-01 LAB — CBC
HCT: 27.8 % — ABNORMAL LOW (ref 36.0–46.0)
Hemoglobin: 8.5 g/dL — ABNORMAL LOW (ref 12.0–15.0)
MCH: 29.1 pg (ref 26.0–34.0)
MCHC: 30.6 g/dL (ref 30.0–36.0)
MCV: 95.2 fL (ref 80.0–100.0)
Platelets: 152 10*3/uL (ref 150–400)
RBC: 2.92 MIL/uL — ABNORMAL LOW (ref 3.87–5.11)
RDW: 16.4 % — ABNORMAL HIGH (ref 11.5–15.5)
WBC: 8.3 10*3/uL (ref 4.0–10.5)
nRBC: 1.2 % — ABNORMAL HIGH (ref 0.0–0.2)

## 2021-06-01 NOTE — ED Triage Notes (Signed)
Pt c/o worsening shortness of breath x 3 days. Recently admitted for acute renal failure, not currently on dialysis. Pt denies new extremity swelling or weight gain. SpO2 100% on room air.

## 2021-06-01 NOTE — ED Provider Triage Note (Signed)
Emergency Medicine Provider Triage Evaluation Note  [name] , a 81 y.o. female  was evaluated in triage.  Pt complains of shortness of breath for 3 days.  Patient was recently admitted for acute renal failure, is not currently on dialysis.  She denies any new extremity swelling or weight gain.  She discharged shortness of breath is worse when she lays down to sleep at night.  Review of Systems  Positive: Shortness of breath Negative: Chest pain, fever, cough  Physical Exam  BP (!) 113/52    Pulse (!) 51    Temp (!) 97.4 F (36.3 C)    Resp 20    SpO2 97%  Gen:   Awake, no distress   Resp:  Normal effort  MSK:   Moves extremities without difficulty  Other:    Medical Decision Making  Medically screening exam initiated at 5:43 PM.  Appropriate orders placed.  Willadene Mounsey was informed that the remainder of the evaluation will be completed by another provider, this initial triage assessment does not replace that evaluation, and the importance of remaining in the ED until their evaluation is complete.     Kateri Plummer, PA-C 06/01/21 1743

## 2021-06-02 NOTE — ED Notes (Signed)
Called for vitals x3

## 2021-06-08 NOTE — Progress Notes (Deleted)
Primary Physician/Referring:  Trey Sailors, PA  Patient ID: [name], female    DOB: 1940/11/28, 81 y.o.   MRN: [id number]  No chief complaint on file.  HPI:    [name]  is a 81 y.o. 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  Admitted 08/2020 with influenza and found to have new onset atrial fibrillation.  Patient was discharged with Eliquis and diltiazem.  She presented to our office 10/16/2020 for follow-up and EKG revealed slow atypical atrial flutter which persisted through [facility] cardiac monitor.  Patient struggled with medication compliance, however when this improved and she was anticoagulated and recommended cardioversion.  Patient underwent successful direct-current cardioversion 01/14/2021.  She then presented to our office the following day with dyspnea and worsening swelling therefore advised her to increase Lasix for approximately 3 days.  However patient then presented to emergency department 01/16/2021.  At that time BNP elevated at 596 and troponin mildly elevated but flat, consistent with acute on chronic heart failure.  While in the emergency department patient diuresed approximately 1 L and was advised to continue outpatient regimen as directed at her last office visit here.  Patient was again admitted 04/25/2021 - 05/02/2021 with sepsis secondary to UTI and bacteremia requiring ICU stay.  Patient had AKI and was hypotensive during hospitalization, therefore antihypertensive medications with the exception of amlodipine were discontinued at discharge.  Patient presents for hospital follow-up.  Since discharge she has resumed all her prior cardiac medications with the exception of Eliquis, which has inadvertently been discontinued at some point since last office  visit.  Past Medical History:  Diagnosis Date   Diabetes mellitus without complication ([place])    Hypertension    Past Surgical History:  Procedure Laterality Date   CARDIOVERSION N/A 01/14/2021   Procedure: CARDIOVERSION;  Surgeon: Adrian Prows, MD;  Location: [facility] ENDOSCOPY;  Service: Cardiovascular;  Laterality: N/A;   TONSILLECTOMY     Family History  Family history unknown: Yes    No history of premature coronary artery disease or sudden cardiac death in her family. Social History   Tobacco Use   Smoking status: Never   Smokeless tobacco: Never  Substance Use Topics   Alcohol use: No   Marital Status: Divorced  ROS  Review of Systems  Constitutional: Positive for malaise/fatigue.  Cardiovascular:  Positive for leg swelling. Negative for chest pain, claudication, near-syncope, orthopnea, palpitations, paroxysmal nocturnal dyspnea and syncope.  Respiratory:  Positive for shortness of breath.   Neurological:  Negative for dizziness.  Objective  There were no vitals taken for this visit.  Vitals with BMI 06/01/2021 06/01/2021 06/01/2021  Height - - -  Weight - - -  BMI - - -  Systolic 116 579 038  Diastolic 73 65 52  Pulse 59 55 51     Physical Exam Vitals reviewed.  Constitutional:      Appearance: She is obese.     Comments: Mildly obese in no acute distress.  Neck:     Vascular: No carotid bruit or JVD.  Cardiovascular:     Rate and Rhythm: Normal rate and regular rhythm.     Pulses:          Carotid pulses are 2+ on the right side and 2+ on the left side.      Femoral pulses are 2+ on the right  side and 2+ on the left side.      Dorsalis pedis pulses are 2+ on the right side and 1+ on the left side.       Posterior tibial pulses are 2+ on the right side and 0 on the left side.     Heart sounds: Normal heart sounds. No murmur heard.   No gallop.     Comments:   Pulmonary:     Effort: Pulmonary effort is normal.     Breath sounds: No wheezing or rales.   Musculoskeletal:     Right lower leg: Edema (2+ pitting) present.     Left lower leg: Edema (2+ pitting) present.  Skin:    Comments: Hair loss and atrophic changes bilateral lower legs.  Neurological:     Mental Status: She is alert.   Laboratory examination:   Recent Labs    05/27/21 0252 05/28/21 0259 06/01/21 1751  NA 138 146* 136  K 3.9 3.9 4.2  CL 106 105 102  CO2 22 24 21*  GLUCOSE 153* 215* 289*  BUN 40* 38* 45*  CREATININE 4.16* 3.67* 5.10*  CALCIUM 8.4* 9.1 8.6*  GFRNONAA 10* 12* 8*    estimated creatinine clearance is 9.3 mL/min (A) (by C-G formula based on SCr of 5.1 mg/dL (H)).  CMP Latest Ref Rng & Units 06/01/2021 05/28/2021 05/27/2021  Glucose 70 - 99 mg/dL 289(H) 215(H) 153(H)  BUN 8 - 23 mg/dL 45(H) 38(H) 40(H)  Creatinine 0.44 - 1.00 mg/dL 5.10(H) 3.67(H) 4.16(H)  Sodium 135 - 145 mmol/L 136 146(H) 138  Potassium 3.5 - 5.1 mmol/L 4.2 3.9 3.9  Chloride 98 - 111 mmol/L 102 105 106  CO2 22 - 32 mmol/L 21(L) 24 22  Calcium 8.9 - 10.3 mg/dL 8.6(L) 9.1 8.4(L)  Total Protein 6.5 - 8.1 g/dL - - -  Total Bilirubin 0.3 - 1.2 mg/dL - - -  Alkaline Phos 38 - 126 U/L - - -  AST 15 - 41 U/L - - -  ALT 0 - 44 U/L - - -   CBC Latest Ref Rng & Units 06/01/2021 05/26/2021 05/23/2021  WBC 4.0 - 10.5 K/uL 8.3 7.3 4.4  Hemoglobin 12.0 - 15.0 g/dL 8.5(L) 8.3(L) 8.1(L)  Hematocrit 36.0 - 46.0 % 27.8(L) 26.1(L) 26.3(L)  Platelets 150 - 400 K/uL 152 124(L) 144(L)    Lipid Panel No results for input(s): CHOL, TRIG, LDLCALC, VLDL, HDL, CHOLHDL, LDLDIRECT in the last 8760 hours.  HEMOGLOBIN A1C Lab Results  Component Value Date   HGBA1C 9.6 (H) 04/25/2021   MPG 228.82 04/25/2021   TSH Recent Labs    09/07/20 1255  TSH 1.023     BNP    Component Value Date/Time   BNP CANCELED 05/12/2021 1311   BNP 596.2 (H) 01/16/2021 1755    ProBNP    Component Value Date/Time   PROBNP 10,030 (H) 05/20/2021 1159    External labs:  01/16/2021: BNP  596 High-sensitivity troponin 38--> 36  05/15/2020: Serum glucose 184 mg, BUN 26, creatinine 1.60, EGFR 35 mL, sodium 137, potassium 4.3, CMP otherwise normal. A1c 10.0%. BNP 08/10/1975. Total cholesterol 224, triglycerides 46, HDL 113, LDL 103.  Non-HDL cholesterol 111. Labs 10/20/2016: TSH normal.  Allergies   Allergies  Allergen Reactions   Amlodipine Other (See Comments)    Patient has LE edema to this med at higher doses (10 mg)    Medications Prior to Visit:   Outpatient Medications Prior to Visit  Medication Sig Dispense Refill  Accu-Chek FastClix Lancets MISC See admin instructions.     amLODipine (NORVASC) 5 MG tablet Take 1 tablet (5 mg total) by mouth daily. 30 tablet 0   BD PEN NEEDLE NANO 2ND GEN 32G X 4 MM MISC 3 (three) times daily. as directed     Blood Glucose Monitoring Suppl (ACCU-CHEK GUIDE ME) w/Device KIT See admin instructions.     HUMULIN 70/30 KWIKPEN (70-30) 100 UNIT/ML KwikPen Inject 5 Units into the skin daily. 15 mL 1   isosorbide dinitrate (ISORDIL) 30 MG tablet Take 1 tablet (30 mg total) by mouth 2 (two) times daily. 30 tablet 1   levofloxacin (LEVAQUIN) 500 MG tablet Take 1 tablet (500 mg total) by mouth every other day. 1 tablet 0   Menthol-Methyl Salicylate (MUSCLE RUB EX) Apply 1 application topically daily as needed (leg cramps).     pravastatin (PRAVACHOL) 20 MG tablet Take 1 tablet (20 mg total) by mouth daily. 90 tablet 3   sodium bicarbonate 650 MG tablet Take 1 tablet (650 mg total) by mouth 2 (two) times daily. 60 tablet 1   No facility-administered medications prior to visit.   Final Medications at End of Visit    No outpatient medications have been marked as taking for the 06/09/21 encounter (Appointment) with Rayetta Pigg,  C, PA-C.   Radiology:   Chest x-ray single view 04/25/2019: Mediastinum hilar structures normal. Cardiomegaly. Mild pulmonary venous congestion bilateral interstitial prominence. Findings suggest mild CHF.   Calcified pulmonary nodules noted consistent with old calcified granulomas. Small right pleural effusion cannot be Excluded.  Cardiac Studies:  Echocardiogram 12/26/2020:  Normal LV systolic function with visual EF 60-65%. Left ventricle cavity  is normal in size. Mild left ventricular hypertrophy. D-shaped septum in  systole and diastole suggestive of RV pressure and volume overload.    Normal global wall motion. Doppler evidence of grade III (restrictive)  diastolic dysfunction, elevated LAP.  Left atrial cavity is mildly dilated. Atrial septum bows from right to  left, suggestive of elevated RAP.  Right atrial cavity is visually dilated.  Right ventricle cavity is visually dilated and normal function.  Mild (Grade I) aortic regurgitation.  Mild mitral valve annulus calcification. Moderate (Grade III) mitral  regurgitation.  Severe tricuspid regurgitation. Moderate pulmonary hypertension. RVSP  measures 51 mmHg (may be unappreciated given RAE and dilated IVC leading  to equalization of pressures).  Moderate pulmonic regurgitation.  IVC is dilated with a respiratory response of <50%.  Compared to study 06/19/2020 G2DD is now G3DD,TR and PHTN remains stable  otherwise no significant change.  ABI 06/19/2020:  This exam reveals normal perfusion of the right and left lower extremity  (ABI 1.00). Mildly abnormal biphasic waveform noted at the ankles.  There  is mild plaque evident in the lower extremity vessels at the ankles.   PCV MYOCARDIAL PERFUSION WITH LEXISCAN 06/11/2020 Nondiagnostic ECG stress. Perfusion imaging study demonstrates decreased uptake in the inferior wall suggestive of soft tissue attenuation.  Ischemia in this region cannot be completely excluded. Overall LV systolic function is normal without regional wall motion abnormalities. Stress LV EF: 82%. Low risk.  Ambulatory cardiac telemetry 10/16/20-6/30-22: Patient in atypical atrial flutter 100% of the time. Heart  rate 53-130 bpm with average 87 bpm. No symptoms reported. Rare PVCs. Dr. Einar Gip assisted in interpretation of findings.  EKG:  02/02/2021: Sinus rhythm with first-degree AV block at a rate of 96 bpm.  Right bundle branch block.  12/30/2020: Atypical atrial flutter at a rate of 104 bpm.  Left axis.  right bundle branch block.  10/16/2020: Slow atypical atrial flutter at a rate of 85 bpm.  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  09/07/2020: Atrial fibrillation, 104 bpm, right bundle branch block, left anterior fascicular block, poor R wave progression, without underlying injury pattern.  EKG 03/19/2020: Sinus rhythm with first-degree AV block at rate of 80 bpm, left axis deviation, left anterior fascicular block.  Right bundle branch block.  Trifascicular block.  Assessment   No diagnosis found.    There are no discontinued medications.    No orders of the defined types were placed in this encounter.   This patients CHA2DS2-VASc Score 5 (HTN, DM, age, F) and yearly risk of stroke 7.2%.   Recommendations:   [name] is a 81 y.o. 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  Admitted 08/2020 with influenza and found to have new onset atrial fibrillation.  Patient was discharged with Eliquis and diltiazem.  She presented to our office 10/16/2020 for follow-up and EKG revealed slow atypical atrial flutter which persisted through [facility] cardiac monitor.  Patient struggled with medication compliance, however when this improved and she was anticoagulated recommended cardioversion.  Patient underwent successful direct-current cardioversion 01/14/2021.  She presented to our office 01/15/2021 and again to the emergency department 01/16/2021 with  worsening dyspnea and swelling.  During her office visit on 9/15 patient had been advised to increase outpatient Lasix.   Patient was again admitted 04/25/2021 - 05/02/2021 with sepsis secondary to UTI and bacteremia requiring ICU stay.  Patient had AKI and was hypotensive during hospitalization, therefore antihypertensive medications with the exception of amlodipine were discontinued at discharge.  Patient presents for hospital follow-up.  Patient is tolerating resuming antihypertensive medications without issue.  Blood pressure is well controlled.  However given AKI advised patient to hold olmesartan until he can review repeat labs.  Patient reports she was seen by PCP earlier this week who did lab work-up.  Patient also needs referral to nephrology, although it is unclear whether PCP has done this or if our office should do so.  I have therefore requested PCP records be sent to our office as soon as possible.  Patient may continue Lasix as she appears volume overloaded on exam today.  I have refilled her pravastatin and hydralazine as requested.  In regard to atrial fibrillation, patient's CHA2DS2-VASc score is 5, however somehow Eliquis has been inadvertently discontinued.  To my knowledge there is no contraindication for anticoagulation, will therefore verify with patient's PCP.  Discussed with patient recommendation of resuming anticoagulation, however will hold off until unable to contact PCPs office.  Tentative plan will be to resume Eliquis.  Will obtain BMP and BNP.  Follow-up in 2 weeks, sooner if needed, for heart failure and atrial fibrillation management.   Terry Berthold, PA-C 06/08/2021, 3:24 PM Office: [phone]

## 2021-06-09 ENCOUNTER — Ambulatory Visit: Payer: Medicare HMO | Admitting: Student

## 2021-06-12 ENCOUNTER — Emergency Department (HOSPITAL_COMMUNITY): Payer: Medicare HMO

## 2021-06-12 ENCOUNTER — Inpatient Hospital Stay (HOSPITAL_COMMUNITY)
Admission: EM | Admit: 2021-06-12 | Discharge: 2021-06-19 | DRG: 673 | Disposition: A | Discharge status: Home Health | Payer: Medicare HMO | Attending: Internal Medicine | Admitting: Internal Medicine

## 2021-06-12 ENCOUNTER — Encounter (HOSPITAL_COMMUNITY): Payer: Self-pay | Admitting: Emergency Medicine

## 2021-06-12 DIAGNOSIS — J101 Influenza due to other identified influenza virus with other respiratory manifestations: Secondary | ICD-10-CM | POA: Diagnosis present

## 2021-06-12 DIAGNOSIS — Z20822 Contact with and (suspected) exposure to covid-19: Secondary | ICD-10-CM | POA: Diagnosis present

## 2021-06-12 DIAGNOSIS — E785 Hyperlipidemia, unspecified: Secondary | ICD-10-CM | POA: Diagnosis present

## 2021-06-12 DIAGNOSIS — D649 Anemia, unspecified: Secondary | ICD-10-CM

## 2021-06-12 DIAGNOSIS — I480 Paroxysmal atrial fibrillation: Secondary | ICD-10-CM | POA: Diagnosis present

## 2021-06-12 DIAGNOSIS — T502X5A Adverse effect of carbonic-anhydrase inhibitors, benzothiadiazides and other diuretics, initial encounter: Secondary | ICD-10-CM | POA: Diagnosis not present

## 2021-06-12 DIAGNOSIS — Z992 Dependence on renal dialysis: Secondary | ICD-10-CM | POA: Diagnosis not present

## 2021-06-12 DIAGNOSIS — R062 Wheezing: Secondary | ICD-10-CM | POA: Diagnosis present

## 2021-06-12 DIAGNOSIS — Z888 Allergy status to other drugs, medicaments and biological substances status: Secondary | ICD-10-CM | POA: Diagnosis not present

## 2021-06-12 DIAGNOSIS — N390 Urinary tract infection, site not specified: Secondary | ICD-10-CM | POA: Diagnosis present

## 2021-06-12 DIAGNOSIS — I081 Rheumatic disorders of both mitral and tricuspid valves: Secondary | ICD-10-CM | POA: Diagnosis present

## 2021-06-12 DIAGNOSIS — I132 Hypertensive heart and chronic kidney disease with heart failure and with stage 5 chronic kidney disease, or end stage renal disease: Secondary | ICD-10-CM | POA: Diagnosis present

## 2021-06-12 DIAGNOSIS — R5381 Other malaise: Secondary | ICD-10-CM | POA: Diagnosis present

## 2021-06-12 DIAGNOSIS — E1122 Type 2 diabetes mellitus with diabetic chronic kidney disease: Secondary | ICD-10-CM | POA: Diagnosis present

## 2021-06-12 DIAGNOSIS — E876 Hypokalemia: Secondary | ICD-10-CM | POA: Diagnosis not present

## 2021-06-12 DIAGNOSIS — E1129 Type 2 diabetes mellitus with other diabetic kidney complication: Secondary | ICD-10-CM | POA: Diagnosis present

## 2021-06-12 DIAGNOSIS — R8281 Pyuria: Secondary | ICD-10-CM | POA: Insufficient documentation

## 2021-06-12 DIAGNOSIS — N189 Chronic kidney disease, unspecified: Secondary | ICD-10-CM | POA: Diagnosis present

## 2021-06-12 DIAGNOSIS — I272 Pulmonary hypertension, unspecified: Secondary | ICD-10-CM | POA: Diagnosis present

## 2021-06-12 DIAGNOSIS — N186 End stage renal disease: Secondary | ICD-10-CM | POA: Diagnosis present

## 2021-06-12 DIAGNOSIS — N2581 Secondary hyperparathyroidism of renal origin: Secondary | ICD-10-CM | POA: Diagnosis present

## 2021-06-12 DIAGNOSIS — I5033 Acute on chronic diastolic (congestive) heart failure: Secondary | ICD-10-CM

## 2021-06-12 DIAGNOSIS — Z794 Long term (current) use of insulin: Secondary | ICD-10-CM | POA: Diagnosis not present

## 2021-06-12 DIAGNOSIS — R609 Edema, unspecified: Secondary | ICD-10-CM | POA: Diagnosis present

## 2021-06-12 DIAGNOSIS — D631 Anemia in chronic kidney disease: Secondary | ICD-10-CM | POA: Diagnosis present

## 2021-06-12 DIAGNOSIS — Z79899 Other long term (current) drug therapy: Secondary | ICD-10-CM

## 2021-06-12 DIAGNOSIS — I509 Heart failure, unspecified: Secondary | ICD-10-CM | POA: Diagnosis not present

## 2021-06-12 DIAGNOSIS — D696 Thrombocytopenia, unspecified: Secondary | ICD-10-CM | POA: Diagnosis present

## 2021-06-12 DIAGNOSIS — N179 Acute kidney failure, unspecified: Principal | ICD-10-CM | POA: Diagnosis present

## 2021-06-12 DIAGNOSIS — I10 Essential (primary) hypertension: Secondary | ICD-10-CM | POA: Diagnosis present

## 2021-06-12 DIAGNOSIS — M898X9 Other specified disorders of bone, unspecified site: Secondary | ICD-10-CM | POA: Diagnosis present

## 2021-06-12 HISTORY — DX: Dyspnea, unspecified: R06.00

## 2021-06-12 HISTORY — DX: Pneumonia, unspecified organism: J18.9

## 2021-06-12 HISTORY — DX: Myoneural disorder, unspecified: G70.9

## 2021-06-12 LAB — CBC WITH DIFFERENTIAL/PLATELET
Abs Immature Granulocytes: 0.01 10*3/uL (ref 0.00–0.07)
Basophils Absolute: 0.1 10*3/uL (ref 0.0–0.1)
Basophils Relative: 1 %
Eosinophils Absolute: 0.3 10*3/uL (ref 0.0–0.5)
Eosinophils Relative: 6 %
HCT: 28.8 % — ABNORMAL LOW (ref 36.0–46.0)
Hemoglobin: 8.7 g/dL — ABNORMAL LOW (ref 12.0–15.0)
Immature Granulocytes: 0 %
Lymphocytes Relative: 22 %
Lymphs Abs: 1 10*3/uL (ref 0.7–4.0)
MCH: 29 pg (ref 26.0–34.0)
MCHC: 30.2 g/dL (ref 30.0–36.0)
MCV: 96 fL (ref 80.0–100.0)
Monocytes Absolute: 0.7 10*3/uL (ref 0.1–1.0)
Monocytes Relative: 16 %
Neutro Abs: 2.5 10*3/uL (ref 1.7–7.7)
Neutrophils Relative %: 55 %
Platelets: 143 10*3/uL — ABNORMAL LOW (ref 150–400)
RBC: 3 MIL/uL — ABNORMAL LOW (ref 3.87–5.11)
RDW: 16.1 % — ABNORMAL HIGH (ref 11.5–15.5)
WBC: 4.6 10*3/uL (ref 4.0–10.5)
nRBC: 0 % (ref 0.0–0.2)

## 2021-06-12 LAB — URINALYSIS, ROUTINE W REFLEX MICROSCOPIC
Bilirubin Urine: NEGATIVE
Glucose, UA: NEGATIVE mg/dL
Ketones, ur: NEGATIVE mg/dL
Nitrite: NEGATIVE
Protein, ur: 100 mg/dL — AB
Specific Gravity, Urine: 1.011 (ref 1.005–1.030)
WBC, UA: >50 WBC/hpf — ABNORMAL HIGH (ref 0–5)
pH: 5 (ref 5.0–8.0)

## 2021-06-12 LAB — COMPREHENSIVE METABOLIC PANEL
ALT: 12 U/L (ref 0–44)
AST: 23 U/L (ref 15–41)
Albumin: 3.3 g/dL — ABNORMAL LOW (ref 3.5–5.0)
Alkaline Phosphatase: 62 U/L (ref 38–126)
Anion gap: 12 (ref 5–15)
BUN: 55 mg/dL — ABNORMAL HIGH (ref 8–23)
CO2: 21 mmol/L — ABNORMAL LOW (ref 22–32)
Calcium: 8.4 mg/dL — ABNORMAL LOW (ref 8.9–10.3)
Chloride: 106 mmol/L (ref 98–111)
Creatinine, Ser: 6.91 mg/dL — ABNORMAL HIGH (ref 0.44–1.00)
GFR, Estimated: 6 mL/min — ABNORMAL LOW (ref >60)
Glucose, Bld: 115 mg/dL — ABNORMAL HIGH (ref 70–99)
Potassium: 3.9 mmol/L (ref 3.5–5.1)
Sodium: 139 mmol/L (ref 135–145)
Total Bilirubin: 0.7 mg/dL (ref 0.3–1.2)
Total Protein: 6.8 g/dL (ref 6.5–8.1)

## 2021-06-12 LAB — RESP PANEL BY RT-PCR (FLU A&B, COVID) ARPGX2
Influenza A by PCR: POSITIVE — AB
Influenza B by PCR: NEGATIVE
SARS Coronavirus 2 by RT PCR: NEGATIVE

## 2021-06-12 LAB — BRAIN NATRIURETIC PEPTIDE: B Natriuretic Peptide: 1076.2 pg/mL — ABNORMAL HIGH (ref 0.0–100.0)

## 2021-06-12 LAB — GLUCOSE, CAPILLARY: Glucose-Capillary: 150 mg/dL — ABNORMAL HIGH (ref 70–99)

## 2021-06-12 MED ORDER — POLYETHYLENE GLYCOL 3350 17 G PO PACK: 17.0000 g | PACK | Freq: Every day | ORAL | Status: DC | PRN

## 2021-06-12 MED ORDER — HEPARIN SODIUM (PORCINE) 5000 UNIT/ML IJ SOLN
5000.0000 [IU] | Freq: Three times a day (TID) | INTRAMUSCULAR | Status: DC
  Administered 2021-06-12 – 2021-06-19 (×21): 5000 [IU] via SUBCUTANEOUS
  Filled 2021-06-12 (×19): qty 1

## 2021-06-12 MED ORDER — FUROSEMIDE 10 MG/ML IJ SOLN
80.0000 mg | Freq: Once | INTRAMUSCULAR | Status: AC
  Administered 2021-06-12: 80 mg via INTRAVENOUS
  Filled 2021-06-12: qty 8

## 2021-06-12 MED ORDER — METOLAZONE 5 MG PO TABS
5.0000 mg | ORAL_TABLET | Freq: Once | ORAL | Status: AC
  Administered 2021-06-12: 5 mg via ORAL
  Filled 2021-06-12 (×3): qty 1

## 2021-06-12 MED ORDER — PRAVASTATIN SODIUM 10 MG PO TABS
20.0000 mg | ORAL_TABLET | Freq: Every day | ORAL | Status: DC
  Administered 2021-06-12 – 2021-06-19 (×7): 20 mg via ORAL
  Filled 2021-06-12 (×8): qty 2

## 2021-06-12 MED ORDER — ALBUTEROL SULFATE (2.5 MG/3ML) 0.083% IN NEBU
2.5000 mg | INHALATION_SOLUTION | RESPIRATORY_TRACT | Status: DC | PRN
  Administered 2021-06-14 – 2021-06-18 (×5): 2.5 mg via RESPIRATORY_TRACT
  Filled 2021-06-12 (×5): qty 3

## 2021-06-12 MED ORDER — DEXTROSE 5 % IV SOLN
120.0000 mg | Freq: Once | INTRAVENOUS | Status: AC
  Administered 2021-06-13: 120 mg via INTRAVENOUS
  Filled 2021-06-12: qty 2

## 2021-06-12 MED ORDER — OSELTAMIVIR PHOSPHATE 30 MG PO CAPS
30.0000 mg | ORAL_CAPSULE | ORAL | Status: AC
  Administered 2021-06-12 – 2021-06-14 (×2): 30 mg via ORAL
  Filled 2021-06-12 (×3): qty 1

## 2021-06-12 MED ORDER — INSULIN ASPART 100 UNIT/ML IJ SOLN
0.0000 [IU] | Freq: Three times a day (TID) | INTRAMUSCULAR | Status: DC
  Administered 2021-06-13 (×2): 1 [IU] via SUBCUTANEOUS
  Administered 2021-06-14: 5 [IU] via SUBCUTANEOUS
  Administered 2021-06-14: 1 [IU] via SUBCUTANEOUS
  Administered 2021-06-16: 18:00:00 3 [IU] via SUBCUTANEOUS
  Administered 2021-06-17 – 2021-06-18 (×4): 1 [IU] via SUBCUTANEOUS
  Administered 2021-06-19: 2 [IU] via SUBCUTANEOUS
  Administered 2021-06-19: 1 [IU] via SUBCUTANEOUS

## 2021-06-12 MED ORDER — SODIUM BICARBONATE 650 MG PO TABS
650.0000 mg | ORAL_TABLET | Freq: Two times a day (BID) | ORAL | Status: DC
  Filled 2021-06-12: qty 1

## 2021-06-12 MED ORDER — FUROSEMIDE 10 MG/ML IJ SOLN
120.0000 mg | Freq: Once | INTRAVENOUS | Status: AC
  Administered 2021-06-12: 120 mg via INTRAVENOUS
  Filled 2021-06-12: qty 10

## 2021-06-12 MED ORDER — HYDRALAZINE HCL 20 MG/ML IJ SOLN: 10.0000 mg | Freq: Four times a day (QID) | INTRAMUSCULAR | Status: DC | PRN

## 2021-06-12 MED ORDER — IPRATROPIUM-ALBUTEROL 0.5-2.5 (3) MG/3ML IN SOLN
3.0000 mL | Freq: Four times a day (QID) | RESPIRATORY_TRACT | Status: DC
  Administered 2021-06-12 – 2021-06-13 (×2): 3 mL via RESPIRATORY_TRACT
  Filled 2021-06-12 (×2): qty 3

## 2021-06-12 NOTE — Assessment & Plan Note (Addendum)
-   on 5 U of 75/25 daily  - last A1c quite high Hemoglobin A1C    Component Value Date/Time   HGBA1C 9.6 (H) 04/25/2021 0413

## 2021-06-12 NOTE — Assessment & Plan Note (Addendum)
-  Echo done on 12/26/2020> EF of 60 to 50%, grade 3 diastolic dysfunction, moderate mitral regurgitation, severe tricuspid regurgitation, moderate pulmonary regurgitation, moderate pulmonary hypertension.

## 2021-06-12 NOTE — Assessment & Plan Note (Addendum)
-   amlodipine 5 mg, Imdur 30 mg on hold- BP stable - Diltiazem is listed on her home med list but she does not take it

## 2021-06-12 NOTE — Assessment & Plan Note (Addendum)
-   cont pravastatin

## 2021-06-12 NOTE — Assessment & Plan Note (Addendum)
This is most likely associated with CKD.

## 2021-06-12 NOTE — ED Provider Triage Note (Signed)
Emergency Medicine Provider Triage Evaluation Note  [name] , a 81 y.o. female  was evaluated in triage.  Pt complains of leg swelling, generalized weakness, fatigue for the past few weeks.  She was sent by nephrologist to the ER for possibly needing dialysis?  She states that over the past week her leg swelling has gotten worse.  Denies any fevers, vomiting.  Review of Systems  Positive: Edema, weakness, fatigue Negative: Fever  Physical Exam  BP 139/67 (BP Location: Right Arm)    Pulse 85    Temp (!) 97.4 F (36.3 C) (Oral)    Resp 20    SpO2 91%  Gen:   Awake, no distress   Resp:  Normal effort  MSK:   Moves extremities without difficulty  Other:  Edema noted to bilateral lower extremities that appear symmetrical  Medical Decision Making  Medically screening exam initiated at 11:19 AM.  Appropriate orders placed.  [name] was informed that the remainder of the evaluation will be completed by another provider, this initial triage assessment does not replace that evaluation, and the importance of remaining in the ED until their evaluation is complete.  Labs ordered   [name], [place] 06/12/21 1121

## 2021-06-12 NOTE — ED Triage Notes (Signed)
Patient states she was sent to ED for evaluation of lower extremity swelling with concern that she may need to start dialysis. Patient alert, oriented, speaking in complete sentences,and in no apparent distress at this time.

## 2021-06-12 NOTE — ED Provider Notes (Signed)
[facility] DEPARTMENT Provider Note   CSN: [id number] Arrival date & time: 06/12/21  1049     History  Chief Complaint  Patient presents with   Leg Swelling    [name] is a 81 y.o. female with a past medical history of CKD presenting to the ED with a chief complaint of leg swelling.  She was sent to the ER from nephrology office today.  Patient has noticed gradually worsening lower extremity edema, shortness of breath over the past week.  She denies any chest pain or injury.  She was told at her last admission last month that she should discontinue use of NSAIDs and she has been compliant in this.  She was told that she has worsening renal function and may need dialysis.  Denies any fevers.  She continues to urinate.  HPI     Home Medications Prior to Admission medications   Medication Sig Start Date End Date Taking? Authorizing Provider  Accu-Chek FastClix Lancets MISC See admin instructions. 01/16/19   [provider]  amLODipine (NORVASC) 5 MG tablet Take 1 tablet (5 mg total) by mouth daily. 05/28/21   Regalado, Belkys A, MD  BD PEN NEEDLE NANO 2ND GEN 32G X 4 MM MISC 3 (three) times daily. as directed 08/13/19   [provider]  Blood Glucose Monitoring Suppl (ACCU-CHEK GUIDE ME) w/Device KIT See admin instructions. 12/11/18   [provider]  HUMULIN 70/30 KWIKPEN (70-30) 100 UNIT/ML KwikPen Inject 5 Units into the skin daily. 05/28/21   Regalado, Belkys A, MD  isosorbide dinitrate (ISORDIL) 30 MG tablet Take 1 tablet (30 mg total) by mouth 2 (two) times daily. 05/28/21   Regalado, Belkys A, MD  levofloxacin (LEVAQUIN) 500 MG tablet Take 1 tablet (500 mg total) by mouth every other day. 05/29/21   Regalado, Belkys A, MD  Menthol-Methyl Salicylate (MUSCLE RUB EX) Apply 1 application topically daily as needed (leg cramps).    [provider]  pravastatin (PRAVACHOL) 20 MG tablet Take 1 tablet (20 mg total) by mouth  daily. 05/12/21   Cantwell, Celeste C, PA-C  sodium bicarbonate 650 MG tablet Take 1 tablet (650 mg total) by mouth 2 (two) times daily. 05/28/21   Regalado, Jerald Kief A, MD      Allergies    Amlodipine    Review of Systems   Review of Systems  Constitutional:  Negative for appetite change, chills and fever.  HENT:  Negative for ear pain, rhinorrhea, sneezing and sore throat.   Eyes:  Negative for photophobia and visual disturbance.  Respiratory:  Positive for shortness of breath. Negative for cough, chest tightness and wheezing.   Cardiovascular:  Positive for leg swelling. Negative for chest pain and palpitations.  Gastrointestinal:  Negative for abdominal pain, blood in stool, constipation, diarrhea, nausea and vomiting.  Genitourinary:  Negative for dysuria, hematuria and urgency.  Musculoskeletal:  Negative for myalgias.  Skin:  Negative for rash.  Neurological:  Negative for dizziness, weakness and light-headedness.   Physical Exam Updated Vital Signs BP 138/77    Pulse 80    Temp 98 F (36.7 C)    Resp 16    SpO2 100%  Physical Exam Vitals and nursing note reviewed.  Constitutional:      General: She is not in acute distress.    Appearance: She is well-developed.  HENT:     Head: Normocephalic and atraumatic.     Nose: Nose normal.  Eyes:     General:  No scleral icterus.       Left eye: No discharge.     Conjunctiva/sclera: Conjunctivae normal.  Cardiovascular:     Rate and Rhythm: Normal rate and regular rhythm.     Heart sounds: Normal heart sounds. No murmur heard.   No friction rub. No gallop.  Pulmonary:     Effort: Pulmonary effort is normal. No respiratory distress.     Comments: Coarse breath sounds noted bilaterally. Abdominal:     General: Bowel sounds are normal. There is no distension.     Palpations: Abdomen is soft.     Tenderness: There is no abdominal tenderness. There is no guarding.  Musculoskeletal:        General: Normal range of motion.      Cervical back: Normal range of motion and neck supple.     Right lower leg: Edema present.     Left lower leg: Edema present.     Comments: 2+ pitting edema noted to bilateral lower extremities.  Skin:    General: Skin is warm and dry.     Findings: No rash.  Neurological:     Mental Status: She is alert.     Motor: No abnormal muscle tone.     Coordination: Coordination normal.    ED Results / Procedures / Treatments   Labs (all labs ordered are listed, but only abnormal results are displayed) Labs Reviewed  COMPREHENSIVE METABOLIC PANEL - Abnormal; Notable for the following components:      Result Value   CO2 21 (*)    Glucose, Bld 115 (*)    BUN 55 (*)    Creatinine, Ser 6.91 (*)    Calcium 8.4 (*)    Albumin 3.3 (*)    GFR, Estimated 6 (*)    All other components within normal limits  CBC WITH DIFFERENTIAL/PLATELET - Abnormal; Notable for the following components:   RBC 3.00 (*)    Hemoglobin 8.7 (*)    HCT 28.8 (*)    RDW 16.1 (*)    Platelets 143 (*)    All other components within normal limits  RESP PANEL BY RT-PCR (FLU A&B, COVID) [id number]  URINALYSIS, ROUTINE W REFLEX MICROSCOPIC  CBG MONITORING, ED    EKG EKG Interpretation  Date/Time:  Friday June 12 2021 11:27:53 EST Ventricular Rate:  81 PR Interval:    QRS Duration: 120 QT Interval:  404 QTC Calculation: 469 R Axis:   250 Text Interpretation: Wide QRS rhythm Low voltage QRS Right bundle branch block Septal infarct , age undetermined Abnormal ECG  no significant change since Jan 2023 Confirmed by Sherwood Gambler [phone]) on 06/12/2021 3:43:43 PM  Radiology DG Chest 2 View  Result Date: 06/12/2021 CLINICAL DATA:  Shortness of breath, lower extremity swelling, concerned that she may need to start dialysis, diabetes mellitus, hypertension EXAM: CHEST - 2 VIEW COMPARISON:  06/01/2021 FINDINGS: Enlargement of cardiac silhouette with pulmonary vascular congestion. Small bibasilar pleural effusions and  minimal basilar atelectasis. No definite acute consolidation or pulmonary edema. No pneumothorax. IMPRESSION: Enlargement of cardiac silhouette with pulmonary vascular congestion. Small bibasilar pleural effusions and minimal atelectasis. Electronically Signed   By: Lavonia Dana M.D.   On: 06/12/2021 12:35    Procedures Procedures    Medications Ordered in ED Medications  furosemide (LASIX) injection 80 mg (has no administration in time range)  metolazone (ZAROXOLYN) tablet 5 mg (has no administration in time range)    ED Course/ Medical Decision Making/ A&P Clinical Course as of  06/12/21 1636  Fri Jun 12, 2021  1551 Hemoglobin(!): 8.7 Around baseline [HK]  1551 Creatinine(!): 6.91 Higher than baseline [HK]  1619 Foley if more than 239m. Nephrology ordering lasix and metolazone. Appreciate their recs. [HK]    Clinical Course User Index [HK] KDelia Heady PA-C                           Medical Decision Making Amount and/or Complexity of Data Reviewed Labs: ordered. Decision-making details documented in ED Course. Radiology: ordered.  Risk Decision regarding hospitalization.   81year old female with past medical history of CKD presenting to the ED for concern for worsening renal function and fluid retention.  She has noticed gradually worsening bilateral lower extremity edema and shortness of breath.  She saw nephrologist this morning in the office and was sent to the ER due to her symptoms.  It appears that her baseline creatinine could be around 3.  She was told that she may need dialysis if her symptoms worsen.  She is denying any chest pain or numbness, weakness.  On exam she has coarse breath sounds bilaterally.  She has pitting edema in both of her lower extremities.  Her chest x-ray shows cardiac silhouette enlargement with vascular congestion.  Her creatinine today is worsening at 6.9 although her potassium level is normal.  Her hemoglobin is around baseline.  I spoke to  nephrology consult, Dr. FRoyce Macadamiawho will place an order for Lasix, metolazone.  We will attempt to treat her fluid overload state with medication but they will also consider dialysis if needed.  Consider Foley placement if she has more than 250 cc of urine on bladder scan which she has also ordered.  Will admit to medicine service for ongoing management of her acute on chronic kidney disease.  Of note at her last admission last month this was thought to be due to NSAID use, she states that she has discontinued all NSAID use since her prior admission as well as holding her ARB and Lasix as she was told.  All imaging, if done today, including plain films, CT scans, and ultrasounds, independently reviewed by me, and interpretations confirmed via formal radiology reads.   Portions of this note were generated with DLobbyist Dictation errors may occur despite best attempts at proofreading.        Final Clinical Impression(s) / ED Diagnoses Final diagnoses:  Peripheral edema  Acute kidney injury ([facility]    Rx / DC Orders ED Discharge Orders     None         KDelia Heady PA-C 06/12/21 1Basehor Ankit, MD 06/13/21 1504

## 2021-06-12 NOTE — Consult Note (Addendum)
[facility] Renal Consultation Note  Requesting MD: Shelly Coss, MD Indication for Consultation:  advanced CKD and volume overload   Chief complaint: shortness of breathing and swelling, sent from [facility] to ER  HPI:  [name] is a 81 y.o. female with a history of CKD stage V, hypertension, type 2 diabetes, and atrial fibrillation who presented to the ER from [facility] where she had an office visit with Dr. Joelyn Oms on 2/10.  She reported shortness of breath and significant leg swelling.  He is concerned that she needs dialysis if this does not respond to IV diuretics.  She re-established care with our office today after being lost to follow-up since 2019.  She was seen in  hospital follow-up actually.  Her diuretics and ARB were discontinued recently for renal insufficiency per charting.   She had presented to the ER recently but left without being seen - Cr 5.10 at that time per charting.  She and I discussed the risks/benefits/indications of dialysis and she does consent to dialysis; we discussed that it would likely be needed as early as Monday unless acute worsening.  She hasn't made much more urine than normal with the lasix IV.    I let her know that she is positive for Flu A.  She isn't aware of any sick contacts.   Creatinine, Ser  Date/Time Value Ref Range Status  06/12/2021 11:41 AM 6.91 (H) 0.44 - 1.00 mg/dL Final  06/01/2021 05:51 PM 5.10 (H) 0.44 - 1.00 mg/dL Final  05/28/2021 02:59 AM 3.67 (H) 0.44 - 1.00 mg/dL Final  05/27/2021 02:52 AM 4.16 (H) 0.44 - 1.00 mg/dL Final  05/26/2021 06:32 AM 4.77 (H) 0.44 - 1.00 mg/dL Final  05/25/2021 02:28 AM 5.84 (H) 0.44 - 1.00 mg/dL Final  05/24/2021 04:38 AM 6.70 (H) 0.44 - 1.00 mg/dL Final  05/23/2021 12:10 AM 7.16 (H) 0.44 - 1.00 mg/dL Final  05/22/2021 05:29 PM 7.21 (H) 0.44 - 1.00 mg/dL Final  05/22/2021 06:03 AM 6.99 (H) 0.44 - 1.00 mg/dL Final  05/22/2021 06:03 AM 6.85 (H) 0.44 - 1.00 mg/dL Final  05/21/2021  08:02 PM 6.77 (H) 0.44 - 1.00 mg/dL Final  05/20/2021 11:59 AM 6.08 (H) 0.57 - 1.00 mg/dL Final  05/12/2021 01:11 PM 3.20 (H) 0.57 - 1.00 mg/dL Final  05/02/2021 08:38 AM 2.49 (H) 0.44 - 1.00 mg/dL Final  05/01/2021 05:44 AM 2.78 (H) 0.44 - 1.00 mg/dL Final  04/30/2021 05:14 AM 3.02 (H) 0.44 - 1.00 mg/dL Final  04/29/2021 05:25 AM 3.28 (H) 0.44 - 1.00 mg/dL Final  04/28/2021 02:57 AM 3.51 (H) 0.44 - 1.00 mg/dL Final  04/27/2021 07:45 AM 4.02 (H) 0.44 - 1.00 mg/dL Final  04/26/2021 03:03 AM 3.69 (H) 0.44 - 1.00 mg/dL Final  04/25/2021 08:58 PM 3.50 (H) 0.44 - 1.00 mg/dL Final  04/25/2021 06:35 PM 3.53 (H) 0.44 - 1.00 mg/dL Final  04/25/2021 04:13 AM 3.08 (H) 0.44 - 1.00 mg/dL Final  04/24/2021 06:21 PM 2.44 (H) 0.44 - 1.00 mg/dL Final  02/03/2021 02:48 PM 1.54 (H) 0.57 - 1.00 mg/dL Final  01/16/2021 05:55 PM 1.87 (H) 0.44 - 1.00 mg/dL Final  01/13/2021 11:43 AM 1.95 (H) 0.57 - 1.00 mg/dL Final  11/07/2020 02:15 PM 1.75 (H) 0.57 - 1.00 mg/dL Final  09/26/2020 02:13 PM 1.59 (H) 0.57 - 1.00 mg/dL Final  09/08/2020 04:37 AM 1.30 (H) 0.44 - 1.00 mg/dL Final  09/07/2020 04:37 AM 1.28 (H) 0.44 - 1.00 mg/dL Final  09/06/2020 06:05 AM 1.46 (H) 0.44 - 1.00  mg/dL Final  09/06/2020 12:07 AM 1.65 (H) 0.44 - 1.00 mg/dL Final  08/08/2020 02:49 PM 1.66 (H) 0.57 - 1.00 mg/dL Final  07/21/2020 01:21 PM 1.42 (H) 0.57 - 1.00 mg/dL Final  08/09/2019 11:04 AM 1.51 (H) 0.57 - 1.00 mg/dL Final  05/22/2019 11:29 AM 1.16 (H) 0.57 - 1.00 mg/dL Final  04/25/2019 02:10 PM 1.36 (H) 0.44 - 1.00 mg/dL Final  05/23/2018 05:05 PM 1.88 (H) 0.44 - 1.00 mg/dL Final     PMHx:   Past Medical History:  Diagnosis Date   Diabetes mellitus without complication ([place])    Hypertension     Past Surgical History:  Procedure Laterality Date   CARDIOVERSION N/A 01/14/2021   Procedure: CARDIOVERSION;  Surgeon: Adrian Prows, MD;  Location: [facility] ENDOSCOPY;  Service: Cardiovascular;  Laterality: N/A;   TONSILLECTOMY      Family  Hx:  She is not aware of a family history of CKD   Social History:  reports that she has never smoked. She has never used smokeless tobacco. She reports that she does not drink alcohol and does not use drugs.  Allergies:  Allergies  Allergen Reactions   Amlodipine Other (See Comments)    Patient has LE edema to this med at higher doses (10 mg)    Medications: Prior to Admission medications   Medication Sig Start Date End Date Taking? Authorizing Provider  Accu-Chek FastClix Lancets MISC See admin instructions. 01/16/19   [provider]  amLODipine (NORVASC) 5 MG tablet Take 1 tablet (5 mg total) by mouth daily. 05/28/21   Regalado, Belkys A, MD  BD PEN NEEDLE NANO 2ND GEN 32G X 4 MM MISC 3 (three) times daily. as directed 08/13/19   [provider]  Blood Glucose Monitoring Suppl (ACCU-CHEK GUIDE ME) w/Device KIT See admin instructions. 12/11/18   [provider]  HUMULIN 70/30 KWIKPEN (70-30) 100 UNIT/ML KwikPen Inject 5 Units into the skin daily. 05/28/21   Regalado, Belkys A, MD  isosorbide dinitrate (ISORDIL) 30 MG tablet Take 1 tablet (30 mg total) by mouth 2 (two) times daily. 05/28/21   Regalado, Belkys A, MD  Menthol-Methyl Salicylate (MUSCLE RUB EX) Apply 1 application topically daily as needed (leg cramps).    [provider]  pravastatin (PRAVACHOL) 20 MG tablet Take 1 tablet (20 mg total) by mouth daily. 05/12/21   Cantwell, Celeste C, PA-C  sodium bicarbonate 650 MG tablet Take 1 tablet (650 mg total) by mouth 2 (two) times daily. 05/28/21   Regalado, Jerald Kief A, MD    I have reviewed the patient's current and reported prior to admission medications.  Labs:  BMP Latest Ref Rng & Units 06/12/2021 06/01/2021 05/28/2021  Glucose 70 - 99 mg/dL 115(H) 289(H) 215(H)  BUN 8 - 23 mg/dL 55(H) 45(H) 38(H)  Creatinine 0.44 - 1.00 mg/dL 6.91(H) 5.10(H) 3.67(H)  BUN/Creat Ratio 12 - 28 - - -  Sodium 135 - 145 mmol/L 139 136 146(H)  Potassium 3.5 - 5.1 mmol/L  3.9 4.2 3.9  Chloride 98 - 111 mmol/L 106 102 105  CO2 22 - 32 mmol/L 21(L) 21(L) 24  Calcium 8.9 - 10.3 mg/dL 8.4(L) 8.6(L) 9.1    Urinalysis    Component Value Date/Time   COLORURINE YELLOW 06/12/2021 1756   APPEARANCEUR CLOUDY (A) 06/12/2021 1756   LABSPEC 1.011 06/12/2021 1756   PHURINE 5.0 06/12/2021 1756   GLUCOSEU NEGATIVE 06/12/2021 1756   HGBUR MODERATE (A) 06/12/2021 1756   BILIRUBINUR NEGATIVE 06/12/2021 1756   KETONESUR NEGATIVE 06/12/2021  [place] (A) 06/12/2021 1756   NITRITE NEGATIVE 06/12/2021 1756   LEUKOCYTESUR LARGE (A) 06/12/2021 1756     ROS:  Pertinent items noted in HPI and remainder of comprehensive ROS otherwise negative.  Physical Exam: Vitals:   06/12/21 1845 06/12/21 1915  BP: (!) 166/92 (!) 146/94  Pulse: 81 82  Resp: 18 18  Temp:    SpO2: 99% 100%     General: elderly female in bed in NAD   HEENT: NCAT Eyes: EOMI sclera anicteric Neck: supple trachea midline Heart: S1S2 no rub Lungs: exp wheezing noted - audible. Normal work of breathing at rest; increased with exertion.  Abdomen: soft/obese habitus/nt/nd Extremities: 3+ hard edema no cyanosis or clubbing Skin: no rash on extremities exposed Neuro: alert and oriented x 3 provides hx and follows commands Psych normal mood and affect   Assessment/Plan:  # CKD stage V  - if volume overload is not manageable with diuretics she would need dialysis and we have discussed that she will likely need dialysis as early as Monday unless urgency develops over the weekend - check post-void residual bladder scan and place a foley catheter if retains over 250 mL urine   # Fluid volume overload - s/p metolazone and lasix in the ER - increase lasix up to 120 mg IV for next two doses and reassess - Strict ins/outs - daily weights  - stop bicarb - floor is going to get her a sat monitor  # Influenza A - Therapies per primary team - discussed with primary team evening coverage  -  will also order albuterol   # HTN  - continue current regimen and diurese as above  # Anemia CKD  - Will discuss ESA with patient   # UTI  - abx per primary team   # Metabolic bone disease - check phos in AM  - check intact PTH  Disposition - continue inpatient monitoring.  May need dialysis as early as 2/13, Monday  Claudia Desanctis 06/12/2021, 10:30 PM

## 2021-06-12 NOTE — Assessment & Plan Note (Addendum)
History of CKD stage IV.   She was admitted and was discharged on 1/26 /23 for the same.    On her last admission, her AKI was thought to be secondary to NSAIDs use, ARB use, borderline hypotension.  She was being followed by [facility] as an outpatient and was sent for admission here after she was seen in the office. - 2/13 tunneled cath placed and started on dialysis    - Left arm brachial artery to basilic vein av fistula created on 2/16 - she is stable to dc today after dialysis and will transition to Tu/Th/Sat dialysis as outpt

## 2021-06-12 NOTE — H&P (Signed)
History and Physical    Patient: [name] OIZ:[id number] DOB: 04/17/1941 DOA: 06/12/2021 DOS: the patient was seen and examined on 06/12/2021 PCP: Trey Sailors, PA  Patient coming from: Home  Chief Complaint  Patient presents with   Leg Swelling    HPI: [name] is a 81 y.o. female with medical history significant of CKD stage IV, hypertension, hyperlipidemia, diabetes type 2, paroxysmal A-fib not on anticoagulation who was sent from nephrology office to the ER for the evaluation of worsening kidney function, bilateral lower extremity swelling.  She was admitted here on January this year for the same.  At that time nephrology was following her.  Her kidney function gradually improved to her baseline (2.5-3) and she was discharged home with plan for nephrology follow-up. Patient had noticed gradual worsening of bilateral lower extremity edema.  She also endorsed shortness of breath over a week.  She was seen at nephrology office today. On presentation she was hemodynamically stable.  Chest x-ray showed bilateral small pleural effusion, pulmonary vascular congestion.Nephrology consulted and she was started on diuretics. Patient seen and examined at the bedside this afternoon in the emergency department.  She had severe bilateral lower extremity swelling.  She was audible wheezing but maintaining her saturation on room air.  Daughter was at the bedside.  Leg edema started about a week ago along with abdominal distention. There was no report of chest pain, palpitations, fever, chills, nausea, vomiting, diarrhea, hematochezia, melena or dysuria.  Review of Systems: As mentioned in the history of present illness. All other systems reviewed and are negative. Past Medical History:  Diagnosis Date   Diabetes mellitus without complication ([place])    Hypertension    Past Surgical History:  Procedure Laterality Date   CARDIOVERSION N/A 01/14/2021   Procedure: CARDIOVERSION;  Surgeon:  Adrian Prows, MD;  Location: [facility];  Service: Cardiovascular;  Laterality: N/A;   TONSILLECTOMY     Social History:  reports that she has never smoked. She has never used smokeless tobacco. She reports that she does not drink alcohol and does not use drugs.  Allergies  Allergen Reactions   Amlodipine Other (See Comments)    Patient has LE edema to this med at higher doses (10 mg)    Family History  Family history unknown: Yes    Prior to Admission medications   Medication Sig Start Date End Date Taking? Authorizing Provider  Accu-Chek FastClix Lancets MISC See admin instructions. 01/16/19   [provider]  amLODipine (NORVASC) 5 MG tablet Take 1 tablet (5 mg total) by mouth daily. 05/28/21   Regalado, Belkys A, MD  BD PEN NEEDLE NANO 2ND GEN 32G X 4 MM MISC 3 (three) times daily. as directed 08/13/19   [provider]  Blood Glucose Monitoring Suppl (ACCU-CHEK GUIDE ME) w/Device KIT See admin instructions. 12/11/18   [provider]  HUMULIN 70/30 KWIKPEN (70-30) 100 UNIT/ML KwikPen Inject 5 Units into the skin daily. 05/28/21   Regalado, Belkys A, MD  isosorbide dinitrate (ISORDIL) 30 MG tablet Take 1 tablet (30 mg total) by mouth 2 (two) times daily. 05/28/21   Regalado, Belkys A, MD  levofloxacin (LEVAQUIN) 500 MG tablet Take 1 tablet (500 mg total) by mouth every other day. 05/29/21   Regalado, Belkys A, MD  Menthol-Methyl Salicylate (MUSCLE RUB EX) Apply 1 application topically daily as needed (leg cramps).    [provider]  pravastatin (PRAVACHOL) 20 MG tablet Take 1 tablet (20 mg total) by mouth  daily. 05/12/21   Cantwell, Celeste C, PA-C  sodium bicarbonate 650 MG tablet Take 1 tablet (650 mg total) by mouth 2 (two) times daily. 05/28/21   Elmarie Shiley, MD    Physical Exam: Vitals:   06/12/21 1528 06/12/21 1600 06/12/21 1615 06/12/21 1645  BP: 138/77 132/80 97/85 (!) 145/86  Pulse:      Resp:      Temp:      TempSrc:      SpO2:        General exam: Pleasant elderly female , not in apparent distress HEENT:PERRL,Oral mucosa moist, Ear/Nose normal on gross exam Respiratory system: Bilateral diminished air sounds, wheezing Cardiovascular system: S1 & S2 heard, RRR. No JVD, murmurs, rubs, gallops or clicks. Gastrointestinal system: Abdomen is distended, soft and nontender. No organomegaly or masses felt. Normal bowel sounds heard. Central nervous system: Alert and oriented. No focal neurological deficits. Extremities: Severe bilateral lower extremity pitting edema, no clubbing ,no cyanosis, shiny skin due to edema Skin: No rashes,or ulcers,no icterus ,no pallor, scaling on lower extremities MSK: Normal muscle bulk,tone ,power Psychiatry: Judgement and insight appear normal. Mood & affect appropriate.     Assessment and Plan: * Acute kidney injury superimposed on chronic kidney disease ([place])- (present on admission) History of CKD stage IV.  She was admitted and was discharged on 1/26 / 23 for the same.  Creatinine worsening over the past year.  Most recent baseline creatinine around 2. 5-3. Volume overloaded on presentation.  Creatinine in the range of 7. On her last admission, her AKI was thought to be secondary to NSAIDs use, ARB use, borderline hypotension.  She was being followed by [facility] as an outpatient and was sent for admission here after she was seen in the office. Started on diuretics.  Nephrology following.  She may need dialysis as per nephrology  Acute on chronic diastolic CHF (congestive heart failure) ([place]) Presented with volume overload.  Chest x-ray showed pulmonary vascular congestion, bilateral small pleural effusion.  Started on diuretics.  Continue to monitor input/output, daily weight. Echo done on 12/26/2020 had shown EF of 60 to 36%, grade 3 diastolic dysfunction, moderate mitral regurgitation, severe tricuspid regurgitation, moderate pulmonary regurgitation, moderate pulmonary  hypertension  Normocytic anemia This is most likely associated with CKD.  Hemoglobin is stable in the range of 8, near baseline. Patient also has a stable thrombocytopenia. Monitor CBC  DM (diabetes mellitus), type 2 with renal complications ([place])- (present on admission) Takes insulin at home.  Monitor blood sugars.  Continue sliding scale insulin for now.  Hyperlipidemia- (present on admission) Takes pravastatin which we will continue.  Essential hypertension- (present on admission) Currently normotensive.  Takes amlodipine 5 mg, Imdur 30 mg at home .  We will hold these medicines for now       Advance Care Planning:   Code Status: Full Code Full  Consults: Nephrology  Family Communication: Daughter at bedside  Severity of Illness: The appropriate patient status for this patient is INPATIENT.  Author: Shelly Coss, MD 06/12/2021 5:17 PM  For on call review [email or web address].

## 2021-06-13 ENCOUNTER — Other Ambulatory Visit: Payer: Self-pay

## 2021-06-13 DIAGNOSIS — N189 Chronic kidney disease, unspecified: Secondary | ICD-10-CM | POA: Diagnosis not present

## 2021-06-13 DIAGNOSIS — R8281 Pyuria: Secondary | ICD-10-CM | POA: Insufficient documentation

## 2021-06-13 DIAGNOSIS — N179 Acute kidney failure, unspecified: Secondary | ICD-10-CM | POA: Diagnosis not present

## 2021-06-13 DIAGNOSIS — N390 Urinary tract infection, site not specified: Secondary | ICD-10-CM

## 2021-06-13 LAB — GLUCOSE, CAPILLARY
Glucose-Capillary: 127 mg/dL — ABNORMAL HIGH (ref 70–99)
Glucose-Capillary: 106 mg/dL — ABNORMAL HIGH (ref 70–99)
Glucose-Capillary: 142 mg/dL — ABNORMAL HIGH (ref 70–99)
Glucose-Capillary: 214 mg/dL — ABNORMAL HIGH (ref 70–99)

## 2021-06-13 LAB — BASIC METABOLIC PANEL
Anion gap: 13 (ref 5–15)
BUN: 52 mg/dL — ABNORMAL HIGH (ref 8–23)
CO2: 20 mmol/L — ABNORMAL LOW (ref 22–32)
Calcium: 8.4 mg/dL — ABNORMAL LOW (ref 8.9–10.3)
Chloride: 107 mmol/L (ref 98–111)
Creatinine, Ser: 6.51 mg/dL — ABNORMAL HIGH (ref 0.44–1.00)
GFR, Estimated: 6 mL/min — ABNORMAL LOW (ref >60)
Glucose, Bld: 147 mg/dL — ABNORMAL HIGH (ref 70–99)
Potassium: 3.7 mmol/L (ref 3.5–5.1)
Sodium: 140 mmol/L (ref 135–145)

## 2021-06-13 LAB — PHOSPHORUS: Phosphorus: 6.3 mg/dL — ABNORMAL HIGH (ref 2.5–4.6)

## 2021-06-13 LAB — CBC
HCT: 25.6 % — ABNORMAL LOW (ref 36.0–46.0)
Hemoglobin: 7.9 g/dL — ABNORMAL LOW (ref 12.0–15.0)
MCH: 29.2 pg (ref 26.0–34.0)
MCHC: 30.9 g/dL (ref 30.0–36.0)
MCV: 94.5 fL (ref 80.0–100.0)
Platelets: 135 10*3/uL — ABNORMAL LOW (ref 150–400)
RBC: 2.71 MIL/uL — ABNORMAL LOW (ref 3.87–5.11)
RDW: 16.1 % — ABNORMAL HIGH (ref 11.5–15.5)
WBC: 4.3 10*3/uL (ref 4.0–10.5)
nRBC: 0 % (ref 0.0–0.2)

## 2021-06-13 MED ORDER — SODIUM CHLORIDE 0.9 % IV SOLN
1.0000 g | INTRAVENOUS | Status: DC
  Administered 2021-06-13 – 2021-06-14 (×2): 1 g via INTRAVENOUS
  Filled 2021-06-13 (×2): qty 10

## 2021-06-13 MED ORDER — DARBEPOETIN ALFA 40 MCG/0.4ML IJ SOSY
40.0000 ug | PREFILLED_SYRINGE | Freq: Once | INTRAMUSCULAR | Status: AC
  Administered 2021-06-13: 40 ug via SUBCUTANEOUS
  Filled 2021-06-13: qty 0.4

## 2021-06-13 MED ORDER — FUROSEMIDE 10 MG/ML IJ SOLN
120.0000 mg | Freq: Two times a day (BID) | INTRAVENOUS | Status: DC
  Administered 2021-06-13 – 2021-06-14 (×3): 120 mg via INTRAVENOUS
  Filled 2021-06-13 (×3): qty 12
  Filled 2021-06-13: qty 10
  Filled 2021-06-13: qty 2

## 2021-06-13 MED ORDER — METOLAZONE 5 MG PO TABS
10.0000 mg | ORAL_TABLET | Freq: Once | ORAL | Status: AC
  Administered 2021-06-13: 10 mg via ORAL
  Filled 2021-06-13: qty 2

## 2021-06-13 MED ORDER — MUSCLE RUB 10-15 % EX CREA
1.0000 "application " | TOPICAL_CREAM | CUTANEOUS | Status: DC | PRN
  Administered 2021-06-13: 1 via TOPICAL
  Filled 2021-06-13: qty 85

## 2021-06-13 MED ORDER — SEVELAMER CARBONATE 800 MG PO TABS
800.0000 mg | ORAL_TABLET | Freq: Three times a day (TID) | ORAL | Status: DC
  Administered 2021-06-13 – 2021-06-19 (×15): 800 mg via ORAL
  Filled 2021-06-13 (×15): qty 1

## 2021-06-13 NOTE — Hospital Course (Addendum)
[name] is a 81 y.o. female with medical history significant of CKD stage IV, hypertension, hyperlipidemia, diabetes type 2, paroxysmal A-fib not on anticoagulation who was sent from nephrology office to the ER for the evaluation of worsening kidney function & bilateral lower extremity swelling.  Lab work showed severe elevated creatinine in the range of 6.  Nephrology consulted.  Started on IV diuretics.  Noted to be influenza positive.

## 2021-06-13 NOTE — Progress Notes (Signed)
[facility] Progress Note  Name: [name] MRN: [id number] DOB: 18-Mar-1941  Chief Complaint:  Shortness of breath and leg swelling; sent from [place] office  Subjective:   she had 1.3 L uop over 2/10 charted after arrival to the floor. She states that her breathing is a lot better.  Swelling feels like it went down.  She and I discussed the risks/benefits/indications for ESA and she consents to receive ESA.  No PVR checked that I can see  Review of systems:  Denies n/v Denies chest pain Shortness of breath which is improving ------------- Background on consult:  [name] is a 81 y.o. female with a history of CKD stage V, hypertension, type 2 diabetes, and atrial fibrillation who presented to the ER from [facility] where she had an office visit with Dr. Joelyn Oms on 2/10.  She reported shortness of breath and significant leg swelling.  He is concerned that she needs dialysis if this does not respond to IV diuretics.  She re-established care with our office today after being lost to follow-up since 2019.  She was seen in  hospital follow-up actually.  Her diuretics and ARB were discontinued recently for renal insufficiency per charting.   She had presented to the ER recently but left without being seen - Cr 5.10 at that time per charting.  She and I discussed the risks/benefits/indications of dialysis and she does consent to dialysis; we discussed that it would likely be needed as early as Monday unless acute worsening.  She hasn't made much more urine than normal with the lasix IV.   I let her know that she is positive for Flu A.  She isn't aware of any sick contacts.   Intake/Output Summary (Last 24 hours) at 06/13/2021 1549 Last data filed at 06/13/2021 0504 Gross per 24 hour  Intake --  Output 1300 ml  Net -1300 ml    Vitals:  Vitals:   06/13/21 0257 06/13/21 0502 06/13/21 0728 06/13/21 1314  BP:  136/73 121/82 126/84  Pulse:  92 82 81  Resp:  19 12 20   Temp:   98 F (36.7 C) 98 F (36.7 C) 97.7 F (36.5 C)  TempSrc:  Oral Oral Oral  SpO2: 96% 94% 95% 97%  Weight:      Height:         Physical Exam:  General: elderly female in bed in NAD   HEENT: NCAT Eyes: EOMI sclera anicteric Neck: supple trachea midline Heart: S1S2 no rub Lungs: mostly clear but reduced on auscultation; infrequent exp wheezing noted. Normal work of breathing at rest; increased with exertion.  Abdomen: soft/obese habitus/nt/nd Extremities: 2+ hard edema no cyanosis or clubbing Skin: no rash on extremities exposed Neuro: alert and oriented x 3 provides hx and follows commands Psych normal mood and affect    Medications reviewed   Labs:  BMP Latest Ref Rng & Units 06/13/2021 06/12/2021 06/01/2021  Glucose 70 - 99 mg/dL 147(H) 115(H) 289(H)  BUN 8 - 23 mg/dL 52(H) 55(H) 45(H)  Creatinine 0.44 - 1.00 mg/dL 6.51(H) 6.91(H) 5.10(H)  BUN/Creat Ratio 12 - 28 - - -  Sodium 135 - 145 mmol/L 140 139 136  Potassium 3.5 - 5.1 mmol/L 3.7 3.9 4.2  Chloride 98 - 111 mmol/L 107 106 102  CO2 22 - 32 mmol/L 20(L) 21(L) 21(L)  Calcium 8.9 - 10.3 mg/dL 8.4(L) 8.4(L) 8.6(L)     Assessment/Plan:   # CKD stage V  - if volume overload is not manageable  with diuretics she would need dialysis and we have discussed that she will likely need dialysis as early as 2/13 (Monday) unless urgency develops over the weekend - check post-void residual bladder scan and place a foley catheter if retains over 250 mL urine - re-ordered  - lasix as below   # Fluid volume overload - s/p metolazone and lasix in the ER - Set Lasix at 120 mg IV BID  - redose metolazone 10 mg once before next lasix  - daily weights    # Influenza A - Therapies per primary team - discussed with primary team evening coverage  - albuterol PRN    # HTN  - controlled    # Anemia CKD  - Start aranesp 40 mcg once today   # UTI  - abx per primary team    # Metabolic bone disease - hyperphosphatemia  - start  renvela TID with meals   - intact PTH pending - renal diet   Disposition - continue inpatient monitoring.  May need dialysis as early as 2/13, Monday   Claudia Desanctis, MD 06/13/2021 3:49 PM

## 2021-06-13 NOTE — Progress Notes (Addendum)
PROGRESS NOTE  [name]  EAV:[id number] DOB: Jan 26, 1941 DOA: 06/12/2021 PCP: Trey Sailors, PA   Brief Narrative:  [name] is a 81 y.o. female with medical history significant of CKD stage IV, hypertension, hyperlipidemia, diabetes type 2, paroxysmal A-fib not on anticoagulation who was sent from nephrology office to the ER for the evaluation of worsening kidney function, bilateral lower extremity swelling.  Lab work showed severe elevated creatinine in the range of 6.  Nephrology consulted.  Currently on IV diuretics.  Flu test came to be positive   Assessment & Plan:  Principal Problem:   Acute kidney injury superimposed on chronic kidney disease ([place]) Active Problems:   Acute on chronic diastolic CHF (congestive heart failure) ([facility])   Essential hypertension   Hyperlipidemia   Influenza A   DM (diabetes mellitus), type 2 with renal complications ([facility])   Normocytic anemia   AKI (acute kidney injury) ([place])   UTI (urinary tract infection)   Assessment and Plan: * Acute kidney injury superimposed on chronic kidney disease ([place])- (present on admission) History of CKD stage IV.  She was admitted and was discharged on 1/26 / 23 for the same.  Creatinine worsening over the past year.  Most recent baseline creatinine around 2. 5-3. Volume overloaded on presentation.  Creatinine in the range of 7. On her last admission, her AKI was thought to be secondary to NSAIDs use, ARB use, borderline hypotension.  She was being followed by [facility] as an outpatient and was sent for admission here after she was seen in the office. Started on diuretics with improvement in the volume status.  Nephrology following.  She may need dialysis as per nephrology  Acute on chronic diastolic CHF (congestive heart failure) ([place]) Presented with volume overload.  Chest x-ray showed pulmonary vascular congestion, bilateral small pleural effusion.  Started on diuretics.  Continue to monitor  input/output, daily weight. Echo done on 12/26/2020 had shown EF of 60 to 78%, grade 3 diastolic dysfunction, moderate mitral regurgitation, severe tricuspid regurgitation, moderate pulmonary regurgitation, moderate pulmonary hypertension. She had significant diuresis with significant improvement in the anasarca  UTI (urinary tract infection) UA showed cloudy urine with large leukocytes, many bacteria.  Urine culture has been sent.  Empirically started on ceftriaxone.  Denies any dysuria.  We will follow-up urine culture  Normocytic anemia This is most likely associated with CKD.  Hemoglobin is stable in the range of 8, near baseline. Patient also has a stable thrombocytopenia. Monitor CBC  DM (diabetes mellitus), type 2 with renal complications ([place])- (present on admission) Takes insulin at home.  Monitor blood sugars.  Continue sliding scale insulin for now.  Influenza A She was wheezing on presentation.  Saturating fine on room air.  Started on Tamiflu with improvement  Hyperlipidemia- (present on admission) Takes pravastatin which we will continue.  Essential hypertension- (present on admission) Currently normotensive.  Takes amlodipine 5 mg, Imdur 30 mg at home .  We will hold these medicines for now              DVT prophylaxis:heparin injection 5,000 Units Start: 06/12/21 1700     Code Status: Full Code  Family Communication: Daughter at bedside on 2/10  Patient status: Inpatient  Patient is from : Home  Anticipated discharge to: Home  Estimated DC date: Not sure at this point   Consultants: Nephrology  Procedures: None   Antimicrobials:  Anti-infectives (From admission, onward)    Start     Dose/Rate Route Frequency  Ordered Stop   06/13/21 1000  cefTRIAXone (ROCEPHIN) 1 g in sodium chloride 0.9 % 100 mL IVPB        1 g 200 mL/hr over 30 Minutes Intravenous Every 24 hours 06/13/21 0829     06/12/21 2245  oseltamivir (TAMIFLU) capsule 30 mg        30  mg Oral Every other day 06/12/21 2147 06/16/21 0959       Subjective: Patient seen and examined at the bedside this morning.  Hemodynamically stable.  Bilateral lower extremity edema have significantly improved.  She feels much better today.  No new complaints  Objective: Vitals:   06/13/21 0125 06/13/21 0257 06/13/21 0502 06/13/21 0728  BP:   136/73 121/82  Pulse:   92 82  Resp:   19 12  Temp:   98 F (36.7 C) 98 F (36.7 C)  TempSrc:   Oral Oral  SpO2:  96% 94% 95%  Weight: 84 kg     Height: 5\' 4"  (1.626 m)       Intake/Output Summary (Last 24 hours) at 06/13/2021 1111 Last data filed at 06/13/2021 0504 Gross per 24 hour  Intake --  Output 1300 ml  Net -1300 ml   Filed Weights   06/13/21 0125  Weight: 84 kg    Examination:  General exam: Overall comfortable, not in distress, pleasant elderly female HEENT: PERRL Respiratory system:  no wheezes or crackles  Cardiovascular system: S1 & S2 heard, RRR.  Gastrointestinal system: Abdomen is nondistended, soft and nontender. Central nervous system: Alert and oriented Extremities: Bilateral lower extremity edema, no clubbing ,no cyanosis Skin: No rashes, no ulcers,no icterus     Data Reviewed: I have personally reviewed following labs and imaging studies  CBC: Recent Labs  Lab 06/12/21 1141 06/13/21 0024  WBC 4.6 4.3  NEUTROABS 2.5  --   HGB 8.7* 7.9*  HCT 28.8* 25.6*  MCV 96.0 94.5  PLT 143* 732*   Basic Metabolic Panel: Recent Labs  Lab 06/12/21 1141 06/13/21 0024  NA 139 140  K 3.9 3.7  CL 106 107  CO2 21* 20*  GLUCOSE 115* 147*  BUN 55* 52*  CREATININE 6.91* 6.51*  CALCIUM 8.4* 8.4*  PHOS  --  6.3*     Recent Results (from the past 240 hour(s))  Resp Panel by RT-PCR (Flu A&B, Covid) Nasopharyngeal Swab     Status: Abnormal   Collection Time: 06/12/21  6:01 PM   Specimen: Nasopharyngeal Swab; Nasopharyngeal(NP) swabs in vial transport medium  Result Value Ref Range Status   SARS Coronavirus  2 by RT PCR NEGATIVE NEGATIVE Final    Comment: (NOTE) SARS-CoV-2 target nucleic acids are NOT DETECTED.  The SARS-CoV-2 RNA is generally detectable in upper respiratory specimens during the acute phase of infection. The lowest concentration of SARS-CoV-2 viral copies this assay can detect is 138 copies/mL. A negative result does not preclude SARS-Cov-2 infection and should not be used as the sole basis for treatment or other patient management decisions. A negative result may occur with  improper specimen collection/handling, submission of specimen other than nasopharyngeal swab, presence of viral mutation(s) within the areas targeted by this assay, and inadequate number of viral copies(<138 copies/mL). A negative result must be combined with clinical observations, patient history, and epidemiological information. The expected result is Negative.  Fact Sheet for Patients:  EntrepreneurPulse.com.au  Fact Sheet for Healthcare Providers:  IncredibleEmployment.be  This test is no t yet approved or cleared by the [place] FDA  and  has been authorized for detection and/or diagnosis of SARS-CoV-2 by FDA under an Emergency Use Authorization (EUA). This EUA will remain  in effect (meaning this test can be used) for the duration of the COVID-19 declaration under Section 564(b)(1) of the Act, 21 U.S.C.section 360bbb-3(b)(1), unless the authorization is terminated  or revoked sooner.       Influenza A by PCR POSITIVE (A) NEGATIVE Final   Influenza B by PCR NEGATIVE NEGATIVE Final    Comment: (NOTE) The Xpert Xpress SARS-CoV-2/FLU/RSV plus assay is intended as an aid in the diagnosis of influenza from Nasopharyngeal swab specimens and should not be used as a sole basis for treatment. Nasal washings and aspirates are unacceptable for Xpert Xpress SARS-CoV-2/FLU/RSV testing.  Fact Sheet for  Patients: EntrepreneurPulse.com.au  Fact Sheet for Healthcare Providers: IncredibleEmployment.be  This test is not yet approved or cleared by the [place] FDA and has been authorized for detection and/or diagnosis of SARS-CoV-2 by FDA under an Emergency Use Authorization (EUA). This EUA will remain in effect (meaning this test can be used) for the duration of the COVID-19 declaration under Section 564(b)(1) of the Act, 21 U.S.C. section 360bbb-3(b)(1), unless the authorization is terminated or revoked.  Performed at [facility], [place] [address], [place], [place] [address]      Radiology Studies: DG Chest 2 View  Result Date: 06/12/2021 CLINICAL DATA:  Shortness of breath, lower extremity swelling, concerned that she may need to start dialysis, diabetes mellitus, hypertension EXAM: CHEST - 2 VIEW COMPARISON:  06/01/2021 FINDINGS: Enlargement of cardiac silhouette with pulmonary vascular congestion. Small bibasilar pleural effusions and minimal basilar atelectasis. No definite acute consolidation or pulmonary edema. No pneumothorax. IMPRESSION: Enlargement of cardiac silhouette with pulmonary vascular congestion. Small bibasilar pleural effusions and minimal atelectasis. Electronically Signed   By: Lavonia Dana M.D.   On: 06/12/2021 12:35    Scheduled Meds:  heparin  5,000 Units Subcutaneous Q8H   insulin aspart  0-9 Units Subcutaneous TID WC   oseltamivir  30 mg Oral QODAY   pravastatin  20 mg Oral Daily   Continuous Infusions:  cefTRIAXone (ROCEPHIN)  IV Stopped (06/13/21 1039)   furosemide       LOS: 1 day   Shelly Coss, MD [facility] P2/03/2022, 11:11 AM

## 2021-06-13 NOTE — Plan of Care (Signed)
°  Problem: Clinical Measurements: Goal: Ability to maintain clinical measurements within normal limits will improve Outcome: Progressing Goal: Will remain free from infection Outcome: Progressing Goal: Respiratory complications will improve Outcome: Progressing   Problem: Nutrition: Goal: Adequate nutrition will be maintained Outcome: Progressing   Problem: Coping: Goal: Level of anxiety will decrease Outcome: Progressing   Problem: Elimination: Goal: Will not experience complications related to bowel motility Outcome: Progressing Goal: Will not experience complications related to urinary retention Outcome: Progressing   Problem: Pain Managment: Goal: General experience of comfort will improve Outcome: Progressing   Problem: Safety: Goal: Ability to remain free from injury will improve Outcome: Progressing   Problem: Skin Integrity: Goal: Risk for impaired skin integrity will decrease Outcome: Progressing   Problem: Education: Goal: Knowledge of General Education information will improve Description: Including pain rating scale, medication(s)/side effects and non-pharmacologic comfort measures Outcome: Not Progressing   Problem: Health Behavior/Discharge Planning: Goal: Ability to manage health-related needs will improve Outcome: Not Progressing   Problem: Clinical Measurements: Goal: Diagnostic test results will improve Outcome: Not Progressing Goal: Cardiovascular complication will be avoided Outcome: Not Progressing   Problem: Activity: Goal: Risk for activity intolerance will decrease Outcome: Not Progressing

## 2021-06-13 NOTE — Assessment & Plan Note (Addendum)
Tamiflu given from 2/10-2/12  - noted to be wheezing on 2/14 and then started on Prednisone 40 mg daily x 3 days

## 2021-06-13 NOTE — TOC Initial Note (Signed)
Transition of Care [facility]) - Initial/Assessment Note    Patient Details  Name: [name] MRN: [id number] Date of Birth: 10/01/1940  Transition of [facility]) CM/SW Contact:    Verdell Carmine, RN Phone Number: 06/13/2021, 1:26 PM  Clinical Narrative:                  81 year old patient presented with worsening kidney function and swelling.  History of CKD, was just discharged two weeks ago for same. At that time home health was set up with [facility], and patient received a hospital bed.  Nephrology consult for potential dialysis. Patient has been following up with [facility].  CM will follow for needs, recommendations, and transitions.   Expected Discharge Plan: [place] Barriers to Discharge: Continued Medical Work up   Patient Goals and CMS Choice        Expected Discharge Plan and Services Expected Discharge Plan: [place]   Discharge Planning Services: CM Consult   Living arrangements for the past 2 months: Single Family Home                                      Prior Living Arrangements/Services Living arrangements for the past 2 months: Single Family Home Lives with:: Adult Children Patient language and need for interpreter reviewed:: Yes        Need for Family Participation in Patient Care: Yes (Comment) Care giver support system in place?: Yes (comment)   Criminal Activity/Legal Involvement Pertinent to Current Situation/Hospitalization: No - Comment as needed  Activities of Daily Living Home Assistive Devices/Equipment: CBG Meter, Dentures (specify type) ADL Screening (condition at time of admission) Patient's cognitive ability adequate to safely complete daily activities?: Yes Is the patient deaf or have difficulty hearing?: No Does the patient have difficulty seeing, even when wearing glasses/contacts?: No Does the patient have difficulty concentrating, remembering, or making decisions?: No Patient  able to express need for assistance with ADLs?: Yes Does the patient have difficulty dressing or bathing?: Yes Independently performs ADLs?: No Communication: Independent Dressing (OT): Needs assistance Is this a change from baseline?: Change from baseline, expected to last <3days Grooming: Independent Feeding: Independent Bathing: Needs assistance Is this a change from baseline?: Change from baseline, expected to last <3 days Toileting: Needs assistance Is this a change from baseline?: Pre-admission baseline In/Out Bed: Needs assistance Is this a change from baseline?: Pre-admission baseline Walks in Home: Independent Is this a change from baseline?: Pre-admission baseline Does the patient have difficulty walking or climbing stairs?: Yes Weakness of Legs: Both Weakness of Arms/Hands: None  Permission Sought/Granted                  Emotional Assessment       Orientation: : Oriented to Self, Oriented to Place, Oriented to  Time, Oriented to Situation Alcohol / Substance Use: Not Applicable Psych Involvement: No (comment)  Admission diagnosis:  Peripheral edema [R60.9] AKI (acute kidney injury) ([place]) [N17.9] Acute kidney injury ([place]) [N17.9] Patient Active Problem List   Diagnosis Date Noted   UTI (urinary tract infection) 06/13/2021   Acute on chronic diastolic CHF (congestive heart failure) ([place]) 06/12/2021   Normocytic anemia 06/12/2021   AKI (acute kidney injury) ([place]) 06/12/2021   CKD (chronic kidney disease), stage V ([place]) 05/22/2021   ARF (acute renal failure) ([place]) 05/21/2021   Malnutrition of moderate degree  04/27/2021   Confusion    Wheezing    Bacteremia 04/25/2021   Sepsis with acute organ dysfunction without septic shock ([place]) 04/24/2021   Acute kidney injury superimposed on chronic kidney disease ([place]) 04/24/2021   Acute cystitis without hematuria 04/24/2021   Influenza A 09/06/2020   Persistent atrial fibrillation ([place]) 09/06/2020   DM (diabetes  mellitus), type 2 with renal complications ([place]) 42/39/5320   Prolonged QT interval 09/06/2020   Abnormal gait 06/18/2020   Heart failure ([place]) 06/18/2020   Hyperglycemia due to type 2 diabetes mellitus ([place]) 06/18/2020   Hypertensive heart disease without congestive heart failure 06/18/2020   Mixed incontinence 06/18/2020   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[place]) 06/18/2020   Pure hypercholesterolemia 06/18/2020   Stage 3 chronic kidney disease ([place]) 06/18/2020   Type 2 diabetes mellitus without complications ([place]) 23/34/3568   Vitamin D deficiency 06/18/2020   Essential hypertension 05/08/2019   Hyperlipidemia 05/08/2019   Bilateral lower extremity edema 05/08/2019   Failed hearing screening 02/02/2018   Impacted cerumen of right ear 02/02/2018   Otalgia, right 02/02/2018   PCP:  Trey Sailors, PA Pharmacy:   [facility] [place], [place] - [address] [place] [address] Phone: [phone] Fax: [phone]     Social Determinants of Health (SDOH) Interventions    Readmission Risk Interventions No flowsheet data found.

## 2021-06-13 NOTE — Assessment & Plan Note (Addendum)
UA showed cloudy urine with large leukocytes, many bacteria.   Empirically started on ceftriaxone but discontinued because of finding of multiple species in the urine culture

## 2021-06-14 DIAGNOSIS — N179 Acute kidney failure, unspecified: Secondary | ICD-10-CM | POA: Diagnosis not present

## 2021-06-14 DIAGNOSIS — N189 Chronic kidney disease, unspecified: Secondary | ICD-10-CM | POA: Diagnosis not present

## 2021-06-14 LAB — CBC WITH DIFFERENTIAL/PLATELET
Abs Immature Granulocytes: 0.02 10*3/uL (ref 0.00–0.07)
Basophils Absolute: 0 10*3/uL (ref 0.0–0.1)
Basophils Relative: 1 %
Eosinophils Absolute: 0.2 10*3/uL (ref 0.0–0.5)
Eosinophils Relative: 5 %
HCT: 26.5 % — ABNORMAL LOW (ref 36.0–46.0)
Hemoglobin: 8.4 g/dL — ABNORMAL LOW (ref 12.0–15.0)
Immature Granulocytes: 1 %
Lymphocytes Relative: 23 %
Lymphs Abs: 0.9 10*3/uL (ref 0.7–4.0)
MCH: 29.5 pg (ref 26.0–34.0)
MCHC: 31.7 g/dL (ref 30.0–36.0)
MCV: 93 fL (ref 80.0–100.0)
Monocytes Absolute: 0.6 10*3/uL (ref 0.1–1.0)
Monocytes Relative: 14 %
Neutro Abs: 2.3 10*3/uL (ref 1.7–7.7)
Neutrophils Relative %: 56 %
Platelets: 146 10*3/uL — ABNORMAL LOW (ref 150–400)
RBC: 2.85 MIL/uL — ABNORMAL LOW (ref 3.87–5.11)
RDW: 15.7 % — ABNORMAL HIGH (ref 11.5–15.5)
WBC: 4.1 10*3/uL (ref 4.0–10.5)
nRBC: 0 % (ref 0.0–0.2)

## 2021-06-14 LAB — PARATHYROID HORMONE, INTACT (NO CA): PTH: 202 pg/mL — ABNORMAL HIGH (ref 15–65)

## 2021-06-14 LAB — BASIC METABOLIC PANEL
Anion gap: 13 (ref 5–15)
BUN: 51 mg/dL — ABNORMAL HIGH (ref 8–23)
CO2: 20 mmol/L — ABNORMAL LOW (ref 22–32)
Calcium: 8.6 mg/dL — ABNORMAL LOW (ref 8.9–10.3)
Chloride: 106 mmol/L (ref 98–111)
Creatinine, Ser: 6.37 mg/dL — ABNORMAL HIGH (ref 0.44–1.00)
GFR, Estimated: 6 mL/min — ABNORMAL LOW (ref >60)
Glucose, Bld: 136 mg/dL — ABNORMAL HIGH (ref 70–99)
Potassium: 3.5 mmol/L (ref 3.5–5.1)
Sodium: 139 mmol/L (ref 135–145)

## 2021-06-14 LAB — GLUCOSE, CAPILLARY
Glucose-Capillary: 100 mg/dL — ABNORMAL HIGH (ref 70–99)
Glucose-Capillary: 150 mg/dL — ABNORMAL HIGH (ref 70–99)
Glucose-Capillary: 270 mg/dL — ABNORMAL HIGH (ref 70–99)
Glucose-Capillary: 229 mg/dL — ABNORMAL HIGH (ref 70–99)

## 2021-06-14 LAB — URINE CULTURE

## 2021-06-14 MED ORDER — CHLORHEXIDINE GLUCONATE CLOTH 2 % EX PADS
6.0000 | MEDICATED_PAD | Freq: Every day | CUTANEOUS | Status: DC
  Administered 2021-06-15 – 2021-06-16 (×2): 6 via TOPICAL

## 2021-06-14 MED ORDER — METOLAZONE 5 MG PO TABS: 10.0000 mg | ORAL_TABLET | Freq: Once | ORAL | Status: DC

## 2021-06-14 MED ORDER — METOLAZONE 5 MG PO TABS
10.0000 mg | ORAL_TABLET | Freq: Once | ORAL | Status: AC
  Administered 2021-06-14: 10 mg via ORAL
  Filled 2021-06-14: qty 2

## 2021-06-14 MED ORDER — DARBEPOETIN ALFA 40 MCG/0.4ML IJ SOSY: 40.0000 ug | PREFILLED_SYRINGE | INTRAMUSCULAR | Status: DC

## 2021-06-14 NOTE — Progress Notes (Signed)
PROGRESS NOTE  [name]  ZDG:[id number] DOB: 05-07-1940 DOA: 06/12/2021 PCP: Trey Sailors, PA   Brief Narrative:  [name] is a 81 y.o. female with medical history significant of CKD stage IV, hypertension, hyperlipidemia, diabetes type 2, paroxysmal A-fib not on anticoagulation who was sent from nephrology office to the ER for the evaluation of worsening kidney function, bilateral lower extremity swelling.  Lab work showed severe elevated creatinine in the range of 6.  Nephrology consulted.  Currently on IV diuretics.  Flu test came to be positive   Assessment & Plan:  Principal Problem:   Acute kidney injury superimposed on chronic kidney disease ([place]) Active Problems:   Acute on chronic diastolic CHF (congestive heart failure) (HCC)   Essential hypertension   Hyperlipidemia   Influenza A   DM (diabetes mellitus), type 2 with renal complications (HCC)   Normocytic anemia   AKI (acute kidney injury) ([place])   UTI (urinary tract infection)   Assessment and Plan: * Acute kidney injury superimposed on chronic kidney disease ([place])- (present on admission) History of CKD stage IV.  She was admitted and was discharged on 1/26 / 23 for the same.  Creatinine worsening over the past year.  Most recent baseline creatinine around 2. 5-3. Volume overloaded on presentation.  Creatinine in the range of 7. On her last admission, her AKI was thought to be secondary to NSAIDs use, ARB use, borderline hypotension.  She was being followed by [facility] as an outpatient and was sent for admission here after she was seen in the office. Started on diuretics with improvement in the volume status.  Nephrology following.  She may need dialysis as per nephrology  Acute on chronic diastolic CHF (congestive heart failure) ([place]) Presented with volume overload.  Chest x-ray showed pulmonary vascular congestion, bilateral small pleural effusion.  Started on diuretics.  Continue to monitor  input/output, daily weight. Echo done on 12/26/2020 had shown EF of 60 to 95%, grade 3 diastolic dysfunction, moderate mitral regurgitation, severe tricuspid regurgitation, moderate pulmonary regurgitation, moderate pulmonary hypertension. She had significant diuresis with significant improvement in the anasarca  UTI (urinary tract infection) UA showed cloudy urine with large leukocytes, many bacteria.  Urine culture has been sent.  Empirically started on ceftriaxone.  Denies any dysuria.  We will follow-up urine culture  Normocytic anemia This is most likely associated with CKD.  Hemoglobin is stable in the range of 8, near baseline. Patient also has a stable thrombocytopenia. Monitor CBC  DM (diabetes mellitus), type 2 with renal complications ([place])- (present on admission) Takes insulin at home.  Monitor blood sugars.  Continue sliding scale insulin for now.  Influenza A She was wheezing on presentation.  Saturating fine on room air.  Started on Tamiflu with improvement  Hyperlipidemia- (present on admission) Takes pravastatin which we will continue.  Essential hypertension- (present on admission) Currently normotensive.  Takes amlodipine 5 mg, Imdur 30 mg at home .  We will hold these medicines for now              DVT prophylaxis:heparin injection 5,000 Units Start: 06/12/21 1700     Code Status: Full Code  Family Communication: Daughter at bedside on 2/10  Patient status: Inpatient  Patient is from : Home  Anticipated discharge to: Home  Estimated DC date: Not sure at this point   Consultants: Nephrology  Procedures: None   Antimicrobials:  Anti-infectives (From admission, onward)    Start     Dose/Rate Route Frequency  Ordered Stop   06/13/21 1000  cefTRIAXone (ROCEPHIN) 1 g in sodium chloride 0.9 % 100 mL IVPB        1 g 200 mL/hr over 30 Minutes Intravenous Every 24 hours 06/13/21 0829     06/12/21 2245  oseltamivir (TAMIFLU) capsule 30 mg        30  mg Oral Every other day 06/12/21 2147 06/16/21 0959       Subjective: Patient seen and examined at the bedside this morning.  Hemodynamically stable.  Comfortable.  On room air.  Denies any complaints.  Significant improvement in this anasarca  Objective: Vitals:   06/13/21 1955 06/14/21 0042 06/14/21 0316 06/14/21 0500  BP: (!) 118/94 108/78 124/69   Pulse: 74 81 84   Resp: 18 17 14    Temp: 97.8 F (36.6 C) 97.6 F (36.4 C) 97.8 F (36.6 C)   TempSrc: Axillary Oral Oral   SpO2: 98% 94% 96%   Weight:    83.8 kg  Height:        Intake/Output Summary (Last 24 hours) at 06/14/2021 4967 Last data filed at 06/14/2021 0600 Gross per 24 hour  Intake 61.63 ml  Output 900 ml  Net -838.37 ml   Filed Weights   06/13/21 0125 06/14/21 0500  Weight: 84 kg 83.8 kg    Examination:  General exam: Overall comfortable, not in distress, pleasant elderly female HEENT: PERRL Respiratory system:  no wheezes or crackles  Cardiovascular system: S1 & S2 heard, RRR.  Gastrointestinal system: Abdomen is nondistended, soft and nontender. Central nervous system: Alert and oriented Extremities: Trace bilateral lower extremity edema, no clubbing ,no cyanosis Skin: No rashes, no ulcers,no icterus     Data Reviewed: I have personally reviewed following labs and imaging studies  CBC: Recent Labs  Lab 06/12/21 1141 06/13/21 0024 06/14/21 0146  WBC 4.6 4.3 4.1  NEUTROABS 2.5  --  2.3  HGB 8.7* 7.9* 8.4*  HCT 28.8* 25.6* 26.5*  MCV 96.0 94.5 93.0  PLT 143* 135* 591*   Basic Metabolic Panel: Recent Labs  Lab 06/12/21 1141 06/13/21 0024 06/14/21 0146  NA 139 140 139  K 3.9 3.7 3.5  CL 106 107 106  CO2 21* 20* 20*  GLUCOSE 115* 147* 136*  BUN 55* 52* 51*  CREATININE 6.91* 6.51* 6.37*  CALCIUM 8.4* 8.4* 8.6*  PHOS  --  6.3*  --      Recent Results (from the past 240 hour(s))  Resp Panel by RT-PCR (Flu A&B, Covid) Nasopharyngeal Swab     Status: Abnormal   Collection Time:  06/12/21  6:01 PM   Specimen: Nasopharyngeal Swab; Nasopharyngeal(NP) swabs in vial transport medium  Result Value Ref Range Status   SARS Coronavirus 2 by RT PCR NEGATIVE NEGATIVE Final    Comment: (NOTE) SARS-CoV-2 target nucleic acids are NOT DETECTED.  The SARS-CoV-2 RNA is generally detectable in upper respiratory specimens during the acute phase of infection. The lowest concentration of SARS-CoV-2 viral copies this assay can detect is 138 copies/mL. A negative result does not preclude SARS-Cov-2 infection and should not be used as the sole basis for treatment or other patient management decisions. A negative result may occur with  improper specimen collection/handling, submission of specimen other than nasopharyngeal swab, presence of viral mutation(s) within the areas targeted by this assay, and inadequate number of viral copies(<138 copies/mL). A negative result must be combined with clinical observations, patient history, and epidemiological information. The expected result is Negative.  Fact Sheet for  Patients:  EntrepreneurPulse.com.au  Fact Sheet for Healthcare Providers:  IncredibleEmployment.be  This test is no t yet approved or cleared by the [place] FDA and  has been authorized for detection and/or diagnosis of SARS-CoV-2 by FDA under an Emergency Use Authorization (EUA). This EUA will remain  in effect (meaning this test can be used) for the duration of the COVID-19 declaration under Section 564(b)(1) of the Act, 21 U.S.C.section 360bbb-3(b)(1), unless the authorization is terminated  or revoked sooner.       Influenza A by PCR POSITIVE (A) NEGATIVE Final   Influenza B by PCR NEGATIVE NEGATIVE Final    Comment: (NOTE) The Xpert Xpress SARS-CoV-2/FLU/RSV plus assay is intended as an aid in the diagnosis of influenza from Nasopharyngeal swab specimens and should not be used as a sole basis for treatment. Nasal washings  and aspirates are unacceptable for Xpert Xpress SARS-CoV-2/FLU/RSV testing.  Fact Sheet for Patients: EntrepreneurPulse.com.au  Fact Sheet for Healthcare Providers: IncredibleEmployment.be  This test is not yet approved or cleared by the [place] FDA and has been authorized for detection and/or diagnosis of SARS-CoV-2 by FDA under an Emergency Use Authorization (EUA). This EUA will remain in effect (meaning this test can be used) for the duration of the COVID-19 declaration under Section 564(b)(1) of the Act, 21 U.S.C. section 360bbb-3(b)(1), unless the authorization is terminated or revoked.  Performed at [facility], [place] [address] [place], [place] [address]      Radiology Studies: DG Chest 2 View  Result Date: 06/12/2021 CLINICAL DATA:  Shortness of breath, lower extremity swelling, concerned that she may need to start dialysis, diabetes mellitus, hypertension EXAM: CHEST - 2 VIEW COMPARISON:  06/01/2021 FINDINGS: Enlargement of cardiac silhouette with pulmonary vascular congestion. Small bibasilar pleural effusions and minimal basilar atelectasis. No definite acute consolidation or pulmonary edema. No pneumothorax. IMPRESSION: Enlargement of cardiac silhouette with pulmonary vascular congestion. Small bibasilar pleural effusions and minimal atelectasis. Electronically Signed   By: Lavonia Dana M.D.   On: 06/12/2021 12:35    Scheduled Meds:  heparin  5,000 Units Subcutaneous Q8H   insulin aspart  0-9 Units Subcutaneous TID WC   oseltamivir  30 mg Oral QODAY   pravastatin  20 mg Oral Daily   sevelamer carbonate  800 mg Oral TID WC   Continuous Infusions:  cefTRIAXone (ROCEPHIN)  IV Stopped (06/13/21 1039)   furosemide 120 mg (06/13/21 1818)     LOS: 2 days   Shelly Coss, MD [facility] P2/04/2022, 8:12 AM

## 2021-06-14 NOTE — Plan of Care (Signed)
  Problem: Education: Goal: Knowledge of General Education information will improve Description: Including pain rating scale, medication(s)/side effects and non-pharmacologic comfort measures Outcome: Progressing   Problem: Health Behavior/Discharge Planning: Goal: Ability to manage health-related needs will improve Outcome: Progressing   Problem: Clinical Measurements: Goal: Ability to maintain clinical measurements within normal limits will improve Outcome: Progressing Goal: Will remain free from infection Outcome: Progressing Goal: Diagnostic test results will improve Outcome: Progressing   Problem: Activity: Goal: Risk for activity intolerance will decrease Outcome: Progressing   Problem: Nutrition: Goal: Adequate nutrition will be maintained Outcome: Progressing   Problem: Coping: Goal: Level of anxiety will decrease Outcome: Progressing   Problem: Elimination: Goal: Will not experience complications related to bowel motility Outcome: Progressing Goal: Will not experience complications related to urinary retention Outcome: Progressing   Problem: Pain Managment: Goal: General experience of comfort will improve Outcome: Progressing   Problem: Safety: Goal: Ability to remain free from injury will improve Outcome: Progressing   Problem: Skin Integrity: Goal: Risk for impaired skin integrity will decrease Outcome: Progressing   Problem: Clinical Measurements: Goal: Respiratory complications will improve Outcome: Not Progressing Goal: Cardiovascular complication will be avoided Outcome: Not Progressing   

## 2021-06-14 NOTE — Progress Notes (Signed)
[facility] Progress Note  Name: [name] MRN: [id number] DOB: 30-Sep-1940  Chief Complaint:  Shortness of breath and leg swelling; sent from [place] office  Subjective:  she had 900 mL uop over 2/11 as well as one unmeasured urine void.  This is with lasix BID and metolazone.  Her daughter is at bedside.  The patient and I discussed again the risks/benefits/indications for dialysis and she does consent for dialysis   Review of systems: Denies n/v Denies chest pain She reports shortness of breath ------------- Background on consult:  [name] is a 81 y.o. female with a history of CKD stage V, hypertension, type 2 diabetes, and atrial fibrillation who presented to the ER from [facility] where she had an office visit with Dr. Joelyn Oms on 2/10.  She reported shortness of breath and significant leg swelling.  He is concerned that she needs dialysis if this does not respond to IV diuretics.  She re-established care with our office today after being lost to follow-up since 2019.  She was seen in  hospital follow-up actually.  Her diuretics and ARB were discontinued recently for renal insufficiency per charting.   She had presented to the ER recently but left without being seen - Cr 5.10 at that time per charting.  She and I discussed the risks/benefits/indications of dialysis and she does consent to dialysis; we discussed that it would likely be needed as early as Monday unless acute worsening.  She hasn't made much more urine than normal with the lasix IV.   I let her know that she is positive for Flu A.  She isn't aware of any sick contacts.   Intake/Output Summary (Last 24 hours) at 06/14/2021 1241 Last data filed at 06/14/2021 0600 Gross per 24 hour  Intake 61.63 ml  Output 900 ml  Net -838.37 ml    Vitals:  Vitals:   06/14/21 0316 06/14/21 0500 06/14/21 0845 06/14/21 1220  BP: 124/69  110/77 130/68  Pulse: 84  89 87  Resp: 14  16 18   Temp: 97.8 F (36.6 C)   98.1 F (36.7 C) 98.2 F (36.8 C)  TempSrc: Oral  Oral Oral  SpO2: 96%  97% 99%  Weight:  83.8 kg    Height:         Physical Exam:    General: elderly female in bed in NAD   HEENT: NCAT Eyes: EOMI sclera anicteric Neck: supple trachea midline Heart: S1S2 no rub Lungs: audible wheezing and increased work of breathing with speech   Abdomen: soft/obese habitus/nt/nd Extremities: 2+ hard edema no cyanosis or clubbing Skin: no rash on extremities exposed Neuro: alert and oriented x 3 provides hx and follows commands Psych normal mood and affect    Medications reviewed   Labs:  BMP Latest Ref Rng & Units 06/14/2021 06/13/2021 06/12/2021  Glucose 70 - 99 mg/dL 136(H) 147(H) 115(H)  BUN 8 - 23 mg/dL 51(H) 52(H) 55(H)  Creatinine 0.44 - 1.00 mg/dL 6.37(H) 6.51(H) 6.91(H)  BUN/Creat Ratio 12 - 28 - - -  Sodium 135 - 145 mmol/L 139 140 139  Potassium 3.5 - 5.1 mmol/L 3.5 3.7 3.9  Chloride 98 - 111 mmol/L 106 107 106  CO2 22 - 32 mmol/L 20(L) 20(L) 21(L)  Calcium 8.9 - 10.3 mg/dL 8.6(L) 8.4(L) 8.4(L)     Assessment/Plan:   # 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  Given her marginal GFR trends  it appears that she has progressed to ESRD.  Bladder scan at 114 ml on 2/22.   - Will plan for NPO after midnight for tunneled catheter placement.  - Will consult IR for tunneled catheter placement   - HD on 2/13 after access - optimize respiratory status prior to permanent access placement; also with flu A. Once improved will need to consult vascular  - lasix as below - Will need to speak with HD SW to initiate CLIP process tomorrow   # Fluid volume overload - s/p metolazone and lasix in the ER - Continue Lasix at 120 mg IV BID  - redose metolazone 10 mg once before next lasix  - daily weights    # Influenza A - Therapies per primary team - discussed with primary team evening coverage  - albuterol PRN  - I have spoken with  RN and she is going to get albuterol PRN now - may need a neb if no improvement and she discussed this and will reach out to team if needed   # HTN  - controlled    # Anemia CKD  - aranesp 40 mcg every Saturday, started 2/11. Anticipate need to increase dose    # UTI  - abx per primary team    # Metabolic bone disease - hyperphosphatemia  - started renvela TID with meals   - intact PTH is 202 - at goal  - renal diet   Disposition - continue inpatient monitoring. Starting dialysis  Claudia Desanctis, MD 06/14/2021 1:22 PM

## 2021-06-15 ENCOUNTER — Inpatient Hospital Stay (HOSPITAL_COMMUNITY): Payer: Medicare HMO

## 2021-06-15 DIAGNOSIS — N189 Chronic kidney disease, unspecified: Secondary | ICD-10-CM | POA: Diagnosis not present

## 2021-06-15 DIAGNOSIS — N179 Acute kidney failure, unspecified: Secondary | ICD-10-CM | POA: Diagnosis not present

## 2021-06-15 HISTORY — PX: IR US GUIDE VASC ACCESS RIGHT: IMG2390

## 2021-06-15 HISTORY — PX: IR FLUORO GUIDE CV LINE RIGHT: IMG2283

## 2021-06-15 LAB — BASIC METABOLIC PANEL
Anion gap: 13 (ref 5–15)
BUN: 50 mg/dL — ABNORMAL HIGH (ref 8–23)
CO2: 22 mmol/L (ref 22–32)
Calcium: 8.4 mg/dL — ABNORMAL LOW (ref 8.9–10.3)
Chloride: 105 mmol/L (ref 98–111)
Creatinine, Ser: 6.15 mg/dL — ABNORMAL HIGH (ref 0.44–1.00)
GFR, Estimated: 6 mL/min — ABNORMAL LOW (ref >60)
Glucose, Bld: 79 mg/dL (ref 70–99)
Potassium: 3 mmol/L — ABNORMAL LOW (ref 3.5–5.1)
Sodium: 140 mmol/L (ref 135–145)

## 2021-06-15 LAB — CBC
HCT: 26.7 % — ABNORMAL LOW (ref 36.0–46.0)
Hemoglobin: 8.6 g/dL — ABNORMAL LOW (ref 12.0–15.0)
MCH: 29.9 pg (ref 26.0–34.0)
MCHC: 32.2 g/dL (ref 30.0–36.0)
MCV: 92.7 fL (ref 80.0–100.0)
Platelets: 150 10*3/uL (ref 150–400)
RBC: 2.88 MIL/uL — ABNORMAL LOW (ref 3.87–5.11)
RDW: 15.9 % — ABNORMAL HIGH (ref 11.5–15.5)
WBC: 5 10*3/uL (ref 4.0–10.5)
nRBC: 0 % (ref 0.0–0.2)

## 2021-06-15 LAB — GLUCOSE, CAPILLARY
Glucose-Capillary: 92 mg/dL (ref 70–99)
Glucose-Capillary: 78 mg/dL (ref 70–99)
Glucose-Capillary: 116 mg/dL — ABNORMAL HIGH (ref 70–99)
Glucose-Capillary: 115 mg/dL — ABNORMAL HIGH (ref 70–99)

## 2021-06-15 LAB — HEPATITIS B SURFACE ANTIGEN: Hepatitis B Surface Ag: NONREACTIVE

## 2021-06-15 MED ORDER — LIDOCAINE-PRILOCAINE 2.5-2.5 % EX CREA
1.0000 "application " | TOPICAL_CREAM | CUTANEOUS | Status: DC | PRN
  Filled 2021-06-15: qty 5

## 2021-06-15 MED ORDER — MIDAZOLAM HCL 2 MG/2ML IJ SOLN
INTRAMUSCULAR | Status: AC | PRN
  Administered 2021-06-15: 1 mg via INTRAVENOUS

## 2021-06-15 MED ORDER — FENTANYL CITRATE (PF) 100 MCG/2ML IJ SOLN
INTRAMUSCULAR | Status: AC | PRN
  Administered 2021-06-15: 25 ug via INTRAVENOUS

## 2021-06-15 MED ORDER — HEPARIN SODIUM (PORCINE) 1000 UNIT/ML IJ SOLN
INTRAMUSCULAR | Status: AC
  Filled 2021-06-15: qty 10

## 2021-06-15 MED ORDER — GELATIN ABSORBABLE 12-7 MM EX MISC
CUTANEOUS | Status: AC
  Filled 2021-06-15: qty 1

## 2021-06-15 MED ORDER — HEPARIN SODIUM (PORCINE) 1000 UNIT/ML DIALYSIS
1000.0000 [IU] | INTRAMUSCULAR | Status: DC | PRN
  Filled 2021-06-15: qty 1

## 2021-06-15 MED ORDER — SODIUM CHLORIDE 0.9 % IV SOLN: 100.0000 mL | INTRAVENOUS | Status: DC | PRN

## 2021-06-15 MED ORDER — ALTEPLASE 2 MG IJ SOLR: 2.0000 mg | Freq: Once | INTRAMUSCULAR | Status: DC | PRN

## 2021-06-15 MED ORDER — DARBEPOETIN ALFA 60 MCG/0.3ML IJ SOSY
60.0000 ug | PREFILLED_SYRINGE | INTRAMUSCULAR | Status: DC
  Filled 2021-06-15: qty 0.3

## 2021-06-15 MED ORDER — LIDOCAINE HCL (PF) 1 % IJ SOLN
5.0000 mL | INTRAMUSCULAR | Status: DC | PRN
  Filled 2021-06-15: qty 5

## 2021-06-15 MED ORDER — PENTAFLUOROPROP-TETRAFLUOROETH EX AERO: 1.0000 "application " | INHALATION_SPRAY | CUTANEOUS | Status: DC | PRN

## 2021-06-15 MED ORDER — LIDOCAINE HCL 1 % IJ SOLN
INTRAMUSCULAR | Status: AC
  Filled 2021-06-15: qty 20

## 2021-06-15 MED ORDER — POTASSIUM CHLORIDE CRYS ER 20 MEQ PO TBCR
40.0000 meq | EXTENDED_RELEASE_TABLET | Freq: Once | ORAL | Status: AC
  Administered 2021-06-15: 40 meq via ORAL
  Filled 2021-06-15: qty 2

## 2021-06-15 MED ORDER — MIDAZOLAM HCL 2 MG/2ML IJ SOLN
INTRAMUSCULAR | Status: AC
  Filled 2021-06-15: qty 2

## 2021-06-15 MED ORDER — DARBEPOETIN ALFA 60 MCG/0.3ML IJ SOSY
60.0000 ug | PREFILLED_SYRINGE | INTRAMUSCULAR | Status: DC
  Administered 2021-06-19: 60 ug via INTRAVENOUS
  Filled 2021-06-15 (×2): qty 0.3

## 2021-06-15 MED ORDER — CEFAZOLIN SODIUM-DEXTROSE 2-4 GM/100ML-% IV SOLN
INTRAVENOUS | Status: AC
  Filled 2021-06-15: qty 100

## 2021-06-15 MED ORDER — FENTANYL CITRATE (PF) 100 MCG/2ML IJ SOLN
INTRAMUSCULAR | Status: AC
  Filled 2021-06-15: qty 2

## 2021-06-15 MED ORDER — CEFAZOLIN SODIUM-DEXTROSE 2-4 GM/100ML-% IV SOLN
2.0000 g | INTRAVENOUS | Status: AC
  Administered 2021-06-15: 2 g via INTRAVENOUS

## 2021-06-15 NOTE — Procedures (Signed)
Interventional Radiology Procedure Note  Procedure: Tunneled HD catheter placement  Complications: None  Estimated Blood Loss: < 10 mL  Findings: 19 cm tip to cuff length Palindrome tunneled HD catheter placed via right IJ with tip in upper RA. OK to use.  Venetia Night. Kathlene Cote, M.D Pager:  [phone]

## 2021-06-15 NOTE — Progress Notes (Signed)
Requested to see pt for out-pt HD needs. Met with pt at bedside. Introduced self and explained role. Pt prefers an out-pt HD clinic close to home. Pt lives in [place] therefore will proceed with referral to [facility] admissions this afternoon. Pt states that dtr will provide transport to HD appts at d/c. Will assist as needed.   Melven Sartorius Renal Navigator [phone]

## 2021-06-15 NOTE — Consult Note (Signed)
Chief Complaint: Patient was seen in consultation today for  Chief Complaint  Patient presents with   Leg Swelling    Referring Physician(s): Dr. Harrie Jeans  Supervising Physician: Markus Daft  Patient Status: [facility] - In-pt  History of Present Illness: [name] is a 81 y.o. female with a medical history significant for HTN, DM2, paroxysmal atrial fibrillation (not on anticoagulation) and CKD stage IV. She was sent to the ED 06/12/21 for evaluation of worsening kidney function and bilateral lower extremity swelling. She was recently admitted January 2023 for the same issues but her kidney function improved enough for discharge home and outpatient nephrology follow up.   This admission revealed a creatinine of 6.91 (baseline of 2.5-3 ) and she has had a poor response to diuretics. The nephrology team is preparing her for hemodialysis and Interventional Radiology has been asked to evaluate this patient for an image-guided tunneled dialysis catheter.   Past Medical History:  Diagnosis Date   Diabetes mellitus without complication ([place])    Hypertension     Past Surgical History:  Procedure Laterality Date   CARDIOVERSION N/A 01/14/2021   Procedure: CARDIOVERSION;  Surgeon: Adrian Prows, MD;  Location: [facility] ENDOSCOPY;  Service: Cardiovascular;  Laterality: N/A;   TONSILLECTOMY      Allergies: Amlodipine  Medications: Prior to Admission medications   Medication Sig Start Date End Date Taking? Authorizing Provider  acetaminophen (TYLENOL) 500 MG tablet Take 500 mg by mouth every 6 (six) hours as needed for mild pain, fever or headache.   Yes [provider]  amLODipine (NORVASC) 5 MG tablet Take 1 tablet (5 mg total) by mouth daily. 05/28/21  Yes Regalado, Belkys A, MD  HUMULIN 70/30 KWIKPEN (70-30) 100 UNIT/ML KwikPen Inject 5 Units into the skin daily. 05/28/21  Yes Regalado, Belkys A, MD  isosorbide dinitrate (ISORDIL) 30 MG tablet Take 1 tablet (30 mg total) by mouth 2  (two) times daily. 05/28/21  Yes Regalado, Belkys A, MD  Menthol-Methyl Salicylate (MUSCLE RUB EX) Apply 1 application topically daily as needed (leg cramps).   Yes [provider]  Accu-Chek FastClix Lancets MISC See admin instructions. 01/16/19   [provider]  BD PEN NEEDLE NANO 2ND GEN 32G X 4 MM MISC 3 (three) times daily. as directed 08/13/19   [provider]  Blood Glucose Monitoring Suppl (ACCU-CHEK GUIDE ME) w/Device KIT See admin instructions. 12/11/18   [provider]  diltiazem (CARDIZEM CD) 180 MG 24 hr capsule Take 180 mg by mouth daily. Patient not taking: Reported on 06/14/2021 05/16/21   [provider]  pravastatin (PRAVACHOL) 20 MG tablet Take 1 tablet (20 mg total) by mouth daily. 05/12/21   Cantwell, Celeste C, PA-C  sodium bicarbonate 650 MG tablet Take 1 tablet (650 mg total) by mouth 2 (two) times daily. Patient not taking: Reported on 06/14/2021 05/28/21   Elmarie Shiley, MD     Family History  Family history unknown: Yes    Social History   Socioeconomic History   Marital status: Divorced    Spouse name: Not on file   Number of children: 5   Years of education: Not on file   Highest education level: Not on file  Occupational History   Not on file  Tobacco Use   Smoking status: Never   Smokeless tobacco: Never  Vaping Use   Vaping Use: Never used  Substance and Sexual Activity   Alcohol use: No   Drug use: No   Sexual  activity: Not on file  Other Topics Concern   Not on file  Social History Narrative   4 living children   Social Determinants of Health   Financial Resource Strain: Not on file  Food Insecurity: Not on file  Transportation Needs: Not on file  Physical Activity: Not on file  Stress: Not on file  Social Connections: Not on file    Review of Systems: A 12 point ROS discussed and pertinent positives are indicated in the HPI above.  All other systems are negative.  Review of Systems   Constitutional:  Positive for fatigue. Negative for appetite change.  Respiratory:  Negative for cough and shortness of breath.   Cardiovascular:  Negative for chest pain and leg swelling.  Gastrointestinal:  Negative for abdominal pain, diarrhea, nausea and vomiting.  Genitourinary:  Positive for decreased urine volume.  Skin:        Generalized pruritus   Neurological:  Negative for dizziness and headaches.   Vital Signs: BP 121/65 (BP Location: Right Arm)    Pulse 79    Temp 98 F (36.7 C) (Oral)    Resp 18    Ht '5\' 4"'  (1.626 m)    Wt 184 lb 4.9 oz (83.6 kg)    SpO2 94%    BMI 31.64 kg/m   Physical Exam Constitutional:      General: She is not in acute distress. HENT:     Mouth/Throat:     Mouth: Mucous membranes are moist.     Pharynx: Oropharynx is clear.  Cardiovascular:     Rate and Rhythm: Normal rate and regular rhythm.     Pulses: Normal pulses.     Heart sounds: Normal heart sounds.  Pulmonary:     Effort: Pulmonary effort is normal.     Breath sounds: Normal breath sounds.  Abdominal:     General: Bowel sounds are normal.     Palpations: Abdomen is soft.  Skin:    General: Skin is warm and dry.  Neurological:     Mental Status: She is alert and oriented to person, place, and time.    Imaging: DG Chest 2 View  Result Date: 06/12/2021 CLINICAL DATA:  Shortness of breath, lower extremity swelling, concerned that she may need to start dialysis, diabetes mellitus, hypertension EXAM: CHEST - 2 VIEW COMPARISON:  06/01/2021 FINDINGS: Enlargement of cardiac silhouette with pulmonary vascular congestion. Small bibasilar pleural effusions and minimal basilar atelectasis. No definite acute consolidation or pulmonary edema. No pneumothorax. IMPRESSION: Enlargement of cardiac silhouette with pulmonary vascular congestion. Small bibasilar pleural effusions and minimal atelectasis. Electronically Signed   By: Lavonia Dana M.D.   On: 06/12/2021 12:35   DG Chest 2 View  Result  Date: 06/01/2021 CLINICAL DATA:  Shortness of breath. EXAM: CHEST - 2 VIEW COMPARISON:  Chest x-ray 05/22/2021. FINDINGS: The heart is enlarged, unchanged. Small pleural effusions are present. No lung infiltrate or pneumothorax. No acute fractures. IMPRESSION: 1. Cardiomegaly with small bilateral pleural effusions. Electronically Signed   By: Ronney Asters M.D.   On: 06/01/2021 18:19   US RENAL  Result Date: 05/22/2021 CLINICAL DATA:  AKI. EXAM: RENAL / URINARY TRACT ULTRASOUND COMPLETE COMPARISON:  04/28/2021. FINDINGS: Right Kidney: Renal measurements: 9.3 x 3.9 x 3.7 cm = volume: 71.0 mL. Echogenicity within normal limits. No mass or hydronephrosis visualized. Left Kidney: Renal measurements: 9.1 x 4.5 x 5.2 cm = volume: 110.1 mL. Echogenicity within normal limits. No mass or hydronephrosis visualized. Bladder: Appears normal for degree  of bladder distention. Other: There are small bilateral pleural effusions. Ascites is noted in the pelvis. Evaluation of the left kidney is limited due to patient's body habitus. IMPRESSION: 1. Normal evaluation of the kidneys and bladder. 2. Bilateral pleural effusions. 3. Ascites in the pelvis. Electronically Signed   By: Brett Fairy M.D.   On: 05/22/2021 01:02   DG CHEST PORT 1 VIEW  Result Date: 05/22/2021 CLINICAL DATA:  Shortness of breath, weakness. EXAM: PORTABLE CHEST 1 VIEW COMPARISON:  04/25/2021. FINDINGS: The heart enlarged and mediastinal contours are within normal limits. Atherosclerotic calcification of the aorta is noted. Hazy opacity is seen at the left lung base. The right lung is clear. No pneumothorax. IMPRESSION: 1. Hazy opacity at the left lung base, which may be artifactual due to patient's body habitus. A small pleural effusion with atelectasis or infiltrate can not be excluded. 2. Cardiomegaly. Electronically Signed   By: Brett Fairy M.D.   On: 05/22/2021 01:14    Labs:  CBC: Recent Labs    06/01/21 1751 06/12/21 1141 06/13/21 0024  06/14/21 0146  WBC 8.3 4.6 4.3 4.1  HGB 8.5* 8.7* 7.9* 8.4*  HCT 27.8* 28.8* 25.6* 26.5*  PLT 152 143* 135* 146*    COAGS: Recent Labs    09/06/20 0007 04/24/21 1821  INR 1.1 1.3*  APTT  --  32    BMP: Recent Labs    06/12/21 1141 06/13/21 0024 06/14/21 0146 06/15/21 0551  NA 139 140 139 140  K 3.9 3.7 3.5 3.0*  CL 106 107 106 105  CO2 21* 20* 20* 22  GLUCOSE 115* 147* 136* 79  BUN 55* 52* 51* 50*  CALCIUM 8.4* 8.4* 8.6* 8.4*  CREATININE 6.91* 6.51* 6.37* 6.15*  GFRNONAA 6* 6* 6* 6*    LIVER FUNCTION TESTS: Recent Labs    04/29/21 0525 05/21/21 2002 05/22/21 0603 05/23/21 0010 05/24/21 0438 05/26/21 5027 05/27/21 0252 05/28/21 0259 06/12/21 1141  BILITOT 0.8 0.7  --  0.4  --   --   --   --  0.7  AST 37 19  --  18  --   --   --   --  23  ALT 38 11  --  11  --   --   --   --  12  ALKPHOS 71 81  --  75  --   --   --   --  62  PROT 6.0* 7.5  --  6.7  --   --   --   --  6.8  ALBUMIN 2.6* 3.6   < > 3.1*   < > 2.9* 2.7* 2.6* 3.3*   < > = values in this interval not displayed.    TUMOR MARKERS: No results for input(s): AFPTM, CEA, CA199, CHROMGRNA in the last 8760 hours.  Assessment and Plan:  ESRD: [name], 81 year old female, is scheduled today for an image-guided tunneled dialysis catheter.  Risks and benefits discussed with the patient including, but not limited to bleeding, infection, vascular injury, pneumothorax which may require chest tube placement, air embolism or even death  All of the patient's questions were answered, patient is agreeable to proceed.  Consent signed and in chart.   Thank you for this interesting consult.  I greatly enjoyed meeting [name] and look forward to participating in their care.  A copy of this report was sent to the requesting provider on this date.  Electronically Signed: Soyla Dryer, AGACNP-BC [phone] 06/15/2021, 6:59 AM  I spent a total of 20 Minutes    in face to face in clinical  consultation, greater than 50% of which was counseling/coordinating care for tunneled dialysis catheter

## 2021-06-15 NOTE — Progress Notes (Addendum)
[facility] NEPHROLOGY PROGRESS NOTE  Assessment/ Plan: Pt is a 81 y.o. yo female with history of HTN, type II DM, A-fib, CKD stage V followed by Dr. Joelyn Oms for shortness of breath and fluid overload.  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 Status post right IJ TDC placed by IR today with plan to initiate HD.  Discussed with social worker to arrange outpatient HD.  She will need permanent access placement therefore I will order vein mapping. Plan for second HD tomorrow if his schedule allows.  #Fluid overload: Received IV diuretics without much response and now managing with dialysis.  #Influenza A: Continue albuterol and supportive care per primary team.  # Anemia of CKD: Check iron level.  Switching Aranesp to IV.  Monitor hemoglobin.  # Secondary hyperparathyroidism: PTH 202 at goal.  Hyperphosphatemia is managed with Renvela and HD.  # HTN/volume: Blood pressure acceptable, volume managed with dialysis.  Discontinue IV diuretics.  #Hypokalemia due to diuretics: Received a dose of oral KCl.  Managing with HD.  Subjective: Seen and examined.  No new event.  Urine output recorded around 1.1 L with higher dose of Lasix and metolazone.  Denies chest pain, shortness of breath.. Objective Vital signs in last 24 hours: Vitals:   06/15/21 0930 06/15/21 0935 06/15/21 0950 06/15/21 1141  BP: (!) 119/97 129/67 128/68 (!) 154/79  Pulse: 77 77 77 78  Resp: 12 (!) 8 (!) 9 11  Temp:    98 F (36.7 C)  TempSrc:    Oral  SpO2: 100% 100% 100%   Weight:      Height:       Weight change: -0.2 kg  Intake/Output Summary (Last 24 hours) at 06/15/2021 1352 Last data filed at 06/14/2021 1900 Gross per 24 hour  Intake 146.37 ml  Output 1100 ml  Net -953.63 ml       Labs: Basic Metabolic Panel: Recent Labs  Lab 06/13/21 0024 06/14/21 0146 06/15/21 0551  NA 140 139 140  K 3.7 3.5 3.0*  CL 107 106 105   CO2 20* 20* 22  GLUCOSE 147* 136* 79  BUN 52* 51* 50*  CREATININE 6.51* 6.37* 6.15*  CALCIUM 8.4* 8.6* 8.4*  PHOS 6.3*  --   --    Liver Function Tests: Recent Labs  Lab 06/12/21 1141  AST 23  ALT 12  ALKPHOS 62  BILITOT 0.7  PROT 6.8  ALBUMIN 3.3*   No results for input(s): LIPASE, AMYLASE in the last 168 hours. No results for input(s): AMMONIA in the last 168 hours. CBC: Recent Labs  Lab 06/12/21 1141 06/13/21 0024 06/14/21 0146  WBC 4.6 4.3 4.1  NEUTROABS 2.5  --  2.3  HGB 8.7* 7.9* 8.4*  HCT 28.8* 25.6* 26.5*  MCV 96.0 94.5 93.0  PLT 143* 135* 146*   Cardiac Enzymes: No results for input(s): CKTOTAL, CKMB, CKMBINDEX, TROPONINI in the last 168 hours. CBG: Recent Labs  Lab 06/14/21 1217 06/14/21 1653 06/14/21 1934 06/15/21 0753 06/15/21 1336  GLUCAP 150* 270* 229* 92 78    Iron Studies: No results for input(s): IRON, TIBC, TRANSFERRIN, FERRITIN in the last 72 hours. Studies/Results: IR Fluoro Guide CV Line Right  Result Date: 06/15/2021 CLINICAL DATA:  Renal failure and need for tunneled hemodialysis catheter. EXAM: TUNNELED CENTRAL VENOUS HEMODIALYSIS CATHETER PLACEMENT WITH ULTRASOUND AND FLUOROSCOPIC GUIDANCE ANESTHESIA/SEDATION: Moderate (conscious) sedation was employed during this procedure. A total of Versed 1.0 mg and Fentanyl  25 mcg was administered intravenously by radiology nursing. Moderate Sedation Time: 25 minutes. The patient's level of consciousness and vital signs were monitored continuously by radiology nursing throughout the procedure under my direct supervision. MEDICATIONS: 2 g IV Ancef. FLUOROSCOPY: 30 seconds.  2.0 mGy. PROCEDURE: The procedure, risks, benefits, and alternatives were explained to the patient. Questions regarding the procedure were encouraged and answered. The patient understands and consents to the procedure. A timeout was performed prior to initiating the procedure. The right neck and chest were prepped with chlorhexidine  in a sterile fashion, and a sterile drape was applied covering the operative field. Maximum barrier sterile technique with sterile gowns and gloves were used for the procedure. Local anesthesia was provided with 1% lidocaine. Ultrasound was used to confirm patency of the right internal jugular vein. A permanent ultrasound image was recorded. After creating a small venotomy incision, a 21 gauge needle was advanced into the right internal jugular vein under direct, real-time ultrasound guidance. Ultrasound image documentation was performed. After securing guidewire access, an 8 Fr dilator was placed. A J-wire was kinked to measure appropriate catheter length. A Palindrome tunneled hemodialysis catheter measuring 19 cm from tip to cuff was chosen for placement. This was tunneled in a retrograde fashion from the chest wall to the venotomy incision. At the venotomy, serial dilatation was performed and a 15 Fr peel-away sheath was placed over a guidewire. The catheter was then placed through the sheath and the sheath removed. Final catheter positioning was confirmed and documented with a fluoroscopic spot image. The catheter was aspirated, flushed with saline, and injected with appropriate volume heparin dwells. The venotomy incision was closed with subcuticular 4-0 Vicryl. Dermabond was applied to the incision. The catheter exit site was secured with 0-Prolene retention sutures. COMPLICATIONS: None.  No pneumothorax. FINDINGS: After catheter placement, the tip lies in the right atrium. The catheter aspirates normally and is ready for immediate use. IMPRESSION: Placement of tunneled hemodialysis catheter via the right internal jugular vein. The catheter tip lies in the right atrium. The catheter is ready for immediate use. Electronically Signed   By: Aletta Edouard M.D.   On: 06/15/2021 10:23   IR US Guide Vasc Access Right  Result Date: 06/15/2021 CLINICAL DATA:  Renal failure and need for tunneled hemodialysis  catheter. EXAM: TUNNELED CENTRAL VENOUS HEMODIALYSIS CATHETER PLACEMENT WITH ULTRASOUND AND FLUOROSCOPIC GUIDANCE ANESTHESIA/SEDATION: Moderate (conscious) sedation was employed during this procedure. A total of Versed 1.0 mg and Fentanyl 25 mcg was administered intravenously by radiology nursing. Moderate Sedation Time: 25 minutes. The patient's level of consciousness and vital signs were monitored continuously by radiology nursing throughout the procedure under my direct supervision. MEDICATIONS: 2 g IV Ancef. FLUOROSCOPY: 30 seconds.  2.0 mGy. PROCEDURE: The procedure, risks, benefits, and alternatives were explained to the patient. Questions regarding the procedure were encouraged and answered. The patient understands and consents to the procedure. A timeout was performed prior to initiating the procedure. The right neck and chest were prepped with chlorhexidine in a sterile fashion, and a sterile drape was applied covering the operative field. Maximum barrier sterile technique with sterile gowns and gloves were used for the procedure. Local anesthesia was provided with 1% lidocaine. Ultrasound was used to confirm patency of the right internal jugular vein. A permanent ultrasound image was recorded. After creating a small venotomy incision, a 21 gauge needle was advanced into the right internal jugular vein under direct, real-time ultrasound guidance. Ultrasound image documentation was performed. After securing guidewire access,  an 8 Fr dilator was placed. A J-wire was kinked to measure appropriate catheter length. A Palindrome tunneled hemodialysis catheter measuring 19 cm from tip to cuff was chosen for placement. This was tunneled in a retrograde fashion from the chest wall to the venotomy incision. At the venotomy, serial dilatation was performed and a 15 Fr peel-away sheath was placed over a guidewire. The catheter was then placed through the sheath and the sheath removed. Final catheter positioning was  confirmed and documented with a fluoroscopic spot image. The catheter was aspirated, flushed with saline, and injected with appropriate volume heparin dwells. The venotomy incision was closed with subcuticular 4-0 Vicryl. Dermabond was applied to the incision. The catheter exit site was secured with 0-Prolene retention sutures. COMPLICATIONS: None.  No pneumothorax. FINDINGS: After catheter placement, the tip lies in the right atrium. The catheter aspirates normally and is ready for immediate use. IMPRESSION: Placement of tunneled hemodialysis catheter via the right internal jugular vein. The catheter tip lies in the right atrium. The catheter is ready for immediate use. Electronically Signed   By: Aletta Edouard M.D.   On: 06/15/2021 10:23    Medications: Infusions:  furosemide 120 mg (06/14/21 1836)    Scheduled Medications:  Chlorhexidine Gluconate Cloth  6 each Topical Q0600   [START ON 06/20/2021] darbepoetin (ARANESP) injection - NON-DIALYSIS  40 mcg Subcutaneous Q Sat-1800   heparin  5,000 Units Subcutaneous Q8H   heparin sodium (porcine)       insulin aspart  0-9 Units Subcutaneous TID WC   lidocaine       pravastatin  20 mg Oral Daily   sevelamer carbonate  800 mg Oral TID WC    have reviewed scheduled and prn medications.  Physical Exam: General:NAD, comfortable Heart:RRR, s1s2 nl Lungs:clear b/l, no crackle Abdomen:soft, Non-tender, non-distended Extremities:Some LE edema+ Dialysis Access: Right IJ TDC in place.  Janeece Blok [facility] 06/15/2021,1:52 PM  LOS: 3 days

## 2021-06-15 NOTE — Progress Notes (Signed)
PROGRESS NOTE  [name]  FTD:[id number] DOB: 1941-01-21 DOA: 06/12/2021 PCP: Trey Sailors, PA   Brief Narrative:  [name] is a 81 y.o. female with medical history significant of CKD stage IV, hypertension, hyperlipidemia, diabetes type 2, paroxysmal A-fib not on anticoagulation who was sent from nephrology office to the ER for the evaluation of worsening kidney function, bilateral lower extremity swelling.  Lab work showed severe elevated creatinine in the range of 6.  Nephrology consulted.  Started on IV diuretics.  Flu test came to be positive. Placement of tunneled hemodialysis catheter on 2/13    Assessment & Plan:  Principal Problem:   Acute kidney injury superimposed on chronic kidney disease ([place]) Active Problems:   Acute on chronic diastolic CHF (congestive heart failure) ([place])   Essential hypertension   Hyperlipidemia   Influenza A   DM (diabetes mellitus), type 2 with renal complications ([facility])   Normocytic anemia   AKI (acute kidney injury) ([place])   UTI (urinary tract infection)   Assessment and Plan: * Acute kidney injury superimposed on chronic kidney disease ([place])- (present on admission) History of CKD stage IV.  She was admitted and was discharged on 1/26 / 23 for the same.  Creatinine worsening over the past year.  Most recent baseline creatinine around 2. 5-3. Volume overloaded on presentation.  Creatinine in the range of 7. On her last admission, her AKI was thought to be secondary to NSAIDs use, ARB use, borderline hypotension.  She was being followed by [facility] as an outpatient and was sent for admission here after she was seen in the office. Started on diuretics with improvement in the volume status.  Nephrology following. Placement of tunneled hemodialysis catheter on 2/13  Acute on chronic diastolic CHF (congestive heart failure) ([place]) Presented with volume overload.  Chest x-ray showed pulmonary vascular congestion, bilateral small  pleural effusion.  Started on diuretics.  Continue to monitor input/output, daily weight. Echo done on 12/26/2020 had shown EF of 60 to 06%, grade 3 diastolic dysfunction, moderate mitral regurgitation, severe tricuspid regurgitation, moderate pulmonary regurgitation, moderate pulmonary hypertension. She had significant diuresis with significant improvement in the anasarca  UTI (urinary tract infection) UA showed cloudy urine with large leukocytes, many bacteria.  Urine culture has been sent.  Empirically started on ceftriaxone but now discontinued because of finding of multiple species in the urine culture  Normocytic anemia This is most likely associated with CKD.  Hemoglobin is stable in the range of 8, near baseline. Patient also has a stable thrombocytopenia. Monitor CBC  DM (diabetes mellitus), type 2 with renal complications ([place])- (present on admission) Takes insulin at home.  Monitor blood sugars.  Continue sliding scale insulin for now.  Influenza A She was wheezing on presentation.  Saturating fine on room air.  Started on Tamiflu with improvement  Hyperlipidemia- (present on admission) Takes pravastatin which we will continue.  Essential hypertension- (present on admission) Currently normotensive.  Takes amlodipine 5 mg, Imdur 30 mg at home .  We will hold these medicines for now              DVT prophylaxis:heparin injection 5,000 Units Start: 06/12/21 1700     Code Status: Full Code  Family Communication: Daughter on phone on 2/12  Patient status: Inpatient  Patient is from : Home  Anticipated discharge to: Home  Estimated DC date: Not sure at this point,needs clearance from nephrology   Consultants: Nephrology  Procedures: None   Antimicrobials:  Anti-infectives (From  admission, onward)    Start     Dose/Rate Route Frequency Ordered Stop   06/15/21 0930  ceFAZolin (ANCEF) IVPB 2g/100 mL premix        2 g 200 mL/hr over 30 Minutes Intravenous To  Radiology 06/15/21 0831 06/15/21 1059   06/13/21 1000  cefTRIAXone (ROCEPHIN) 1 g in sodium chloride 0.9 % 100 mL IVPB  Status:  Discontinued        1 g 200 mL/hr over 30 Minutes Intravenous Every 24 hours 06/13/21 0829 06/15/21 0807   06/12/21 2245  oseltamivir (TAMIFLU) capsule 30 mg        30 mg Oral Every other day 06/12/21 2147 06/14/21 4650       Subjective:  Patient seen and examined at the bedside this morning.  Hemodynamically stable.  She just came from dialysis catheter placement.  Denies any new complaints today.  Objective: Vitals:   06/15/21 0930 06/15/21 0935 06/15/21 0950 06/15/21 1141  BP: (!) 119/97 129/67 128/68 (!) 154/79  Pulse: 77 77 77 78  Resp: 12 (!) 8 (!) 9 11  Temp:    98 F (36.7 C)  TempSrc:    Oral  SpO2: 100% 100% 100%   Weight:      Height:        Intake/Output Summary (Last 24 hours) at 06/15/2021 1322 Last data filed at 06/14/2021 1900 Gross per 24 hour  Intake 146.37 ml  Output 1100 ml  Net -953.63 ml   Filed Weights   06/13/21 0125 06/14/21 0500 06/15/21 0500  Weight: 84 kg 83.8 kg 83.6 kg    Examination:  General exam: Overall comfortable, not in distress, pleasant elderly female HEENT: PERRL Respiratory system:  no wheezes or crackles  Cardiovascular system: S1 & S2 heard, RRR.  Dialysis catheter on the right chest Gastrointestinal system: Abdomen is nondistended, soft and nontender. Central nervous system: Alert and oriented Extremities: Trace bilateral lower extremity edema, no clubbing ,no cyanosis Skin: No rashes, no ulcers,no icterus    Data Reviewed: I have personally reviewed following labs and imaging studies  CBC: Recent Labs  Lab 06/12/21 1141 06/13/21 0024 06/14/21 0146  WBC 4.6 4.3 4.1  NEUTROABS 2.5  --  2.3  HGB 8.7* 7.9* 8.4*  HCT 28.8* 25.6* 26.5*  MCV 96.0 94.5 93.0  PLT 143* 135* 354*   Basic Metabolic Panel: Recent Labs  Lab 06/12/21 1141 06/13/21 0024 06/14/21 0146 06/15/21 0551  NA 139  140 139 140  K 3.9 3.7 3.5 3.0*  CL 106 107 106 105  CO2 21* 20* 20* 22  GLUCOSE 115* 147* 136* 79  BUN 55* 52* 51* 50*  CREATININE 6.91* 6.51* 6.37* 6.15*  CALCIUM 8.4* 8.4* 8.6* 8.4*  PHOS  --  6.3*  --   --      Recent Results (from the past 240 hour(s))  Resp Panel by RT-PCR (Flu A&B, Covid) Nasopharyngeal Swab     Status: Abnormal   Collection Time: 06/12/21  6:01 PM   Specimen: Nasopharyngeal Swab; Nasopharyngeal(NP) swabs in vial transport medium  Result Value Ref Range Status   SARS Coronavirus 2 by RT PCR NEGATIVE NEGATIVE Final    Comment: (NOTE) SARS-CoV-2 target nucleic acids are NOT DETECTED.  The SARS-CoV-2 RNA is generally detectable in upper respiratory specimens during the acute phase of infection. The lowest concentration of SARS-CoV-2 viral copies this assay can detect is 138 copies/mL. A negative result does not preclude SARS-Cov-2 infection and should not be used as the sole  basis for treatment or other patient management decisions. A negative result may occur with  improper specimen collection/handling, submission of specimen other than nasopharyngeal swab, presence of viral mutation(s) within the areas targeted by this assay, and inadequate number of viral copies(<138 copies/mL). A negative result must be combined with clinical observations, patient history, and epidemiological information. The expected result is Negative.  Fact Sheet for Patients:  EntrepreneurPulse.com.au  Fact Sheet for Healthcare Providers:  IncredibleEmployment.be  This test is no t yet approved or cleared by the [place] FDA and  has been authorized for detection and/or diagnosis of SARS-CoV-2 by FDA under an Emergency Use Authorization (EUA). This EUA will remain  in effect (meaning this test can be used) for the duration of the COVID-19 declaration under Section 564(b)(1) of the Act, 21 U.S.C.section 360bbb-3(b)(1), unless the  authorization is terminated  or revoked sooner.       Influenza A by PCR POSITIVE (A) NEGATIVE Final   Influenza B by PCR NEGATIVE NEGATIVE Final    Comment: (NOTE) The Xpert Xpress SARS-CoV-2/FLU/RSV plus assay is intended as an aid in the diagnosis of influenza from Nasopharyngeal swab specimens and should not be used as a sole basis for treatment. Nasal washings and aspirates are unacceptable for Xpert Xpress SARS-CoV-2/FLU/RSV testing.  Fact Sheet for Patients: EntrepreneurPulse.com.au  Fact Sheet for Healthcare Providers: IncredibleEmployment.be  This test is not yet approved or cleared by the [place] FDA and has been authorized for detection and/or diagnosis of SARS-CoV-2 by FDA under an Emergency Use Authorization (EUA). This EUA will remain in effect (meaning this test can be used) for the duration of the COVID-19 declaration under Section 564(b)(1) of the Act, 21 U.S.C. section 360bbb-3(b)(1), unless the authorization is terminated or revoked.  Performed at [facility], [place] [address] [place], [place] [address]   Urine Culture     Status: Abnormal   Collection Time: 06/13/21  2:25 PM   Specimen: Urine, Clean Catch  Result Value Ref Range Status   Specimen Description URINE, CLEAN CATCH  Final   Special Requests   Final    NONE Performed at [facility], [place] [address] [place], [place] [address]    Culture MULTIPLE SPECIES PRESENT, SUGGEST RECOLLECTION (A)  Final   Report Status 06/14/2021 FINAL  Final     Radiology Studies: IR Fluoro Guide CV Line Right  Result Date: 06/15/2021 CLINICAL DATA:  Renal failure and need for tunneled hemodialysis catheter. EXAM: TUNNELED CENTRAL VENOUS HEMODIALYSIS CATHETER PLACEMENT WITH ULTRASOUND AND FLUOROSCOPIC GUIDANCE ANESTHESIA/SEDATION: Moderate (conscious) sedation was employed during this procedure. A total of Versed 1.0 mg and Fentanyl 25 mcg was administered  intravenously by radiology nursing. Moderate Sedation Time: 25 minutes. The patient's level of consciousness and vital signs were monitored continuously by radiology nursing throughout the procedure under my direct supervision. MEDICATIONS: 2 g IV Ancef. FLUOROSCOPY: 30 seconds.  2.0 mGy. PROCEDURE: The procedure, risks, benefits, and alternatives were explained to the patient. Questions regarding the procedure were encouraged and answered. The patient understands and consents to the procedure. A timeout was performed prior to initiating the procedure. The right neck and chest were prepped with chlorhexidine in a sterile fashion, and a sterile drape was applied covering the operative field. Maximum barrier sterile technique with sterile gowns and gloves were used for the procedure. Local anesthesia was provided with 1% lidocaine. Ultrasound was used to confirm patency of the right internal jugular vein. A permanent ultrasound image was recorded. After  creating a small venotomy incision, a 21 gauge needle was advanced into the right internal jugular vein under direct, real-time ultrasound guidance. Ultrasound image documentation was performed. After securing guidewire access, an 8 Fr dilator was placed. A J-wire was kinked to measure appropriate catheter length. A Palindrome tunneled hemodialysis catheter measuring 19 cm from tip to cuff was chosen for placement. This was tunneled in a retrograde fashion from the chest wall to the venotomy incision. At the venotomy, serial dilatation was performed and a 15 Fr peel-away sheath was placed over a guidewire. The catheter was then placed through the sheath and the sheath removed. Final catheter positioning was confirmed and documented with a fluoroscopic spot image. The catheter was aspirated, flushed with saline, and injected with appropriate volume heparin dwells. The venotomy incision was closed with subcuticular 4-0 Vicryl. Dermabond was applied to the incision. The  catheter exit site was secured with 0-Prolene retention sutures. COMPLICATIONS: None.  No pneumothorax. FINDINGS: After catheter placement, the tip lies in the right atrium. The catheter aspirates normally and is ready for immediate use. IMPRESSION: Placement of tunneled hemodialysis catheter via the right internal jugular vein. The catheter tip lies in the right atrium. The catheter is ready for immediate use. Electronically Signed   By: Aletta Edouard M.D.   On: 06/15/2021 10:23   IR US Guide Vasc Access Right  Result Date: 06/15/2021 CLINICAL DATA:  Renal failure and need for tunneled hemodialysis catheter. EXAM: TUNNELED CENTRAL VENOUS HEMODIALYSIS CATHETER PLACEMENT WITH ULTRASOUND AND FLUOROSCOPIC GUIDANCE ANESTHESIA/SEDATION: Moderate (conscious) sedation was employed during this procedure. A total of Versed 1.0 mg and Fentanyl 25 mcg was administered intravenously by radiology nursing. Moderate Sedation Time: 25 minutes. The patient's level of consciousness and vital signs were monitored continuously by radiology nursing throughout the procedure under my direct supervision. MEDICATIONS: 2 g IV Ancef. FLUOROSCOPY: 30 seconds.  2.0 mGy. PROCEDURE: The procedure, risks, benefits, and alternatives were explained to the patient. Questions regarding the procedure were encouraged and answered. The patient understands and consents to the procedure. A timeout was performed prior to initiating the procedure. The right neck and chest were prepped with chlorhexidine in a sterile fashion, and a sterile drape was applied covering the operative field. Maximum barrier sterile technique with sterile gowns and gloves were used for the procedure. Local anesthesia was provided with 1% lidocaine. Ultrasound was used to confirm patency of the right internal jugular vein. A permanent ultrasound image was recorded. After creating a small venotomy incision, a 21 gauge needle was advanced into the right internal jugular vein  under direct, real-time ultrasound guidance. Ultrasound image documentation was performed. After securing guidewire access, an 8 Fr dilator was placed. A J-wire was kinked to measure appropriate catheter length. A Palindrome tunneled hemodialysis catheter measuring 19 cm from tip to cuff was chosen for placement. This was tunneled in a retrograde fashion from the chest wall to the venotomy incision. At the venotomy, serial dilatation was performed and a 15 Fr peel-away sheath was placed over a guidewire. The catheter was then placed through the sheath and the sheath removed. Final catheter positioning was confirmed and documented with a fluoroscopic spot image. The catheter was aspirated, flushed with saline, and injected with appropriate volume heparin dwells. The venotomy incision was closed with subcuticular 4-0 Vicryl. Dermabond was applied to the incision. The catheter exit site was secured with 0-Prolene retention sutures. COMPLICATIONS: None.  No pneumothorax. FINDINGS: After catheter placement, the tip lies in the right atrium. The catheter  aspirates normally and is ready for immediate use. IMPRESSION: Placement of tunneled hemodialysis catheter via the right internal jugular vein. The catheter tip lies in the right atrium. The catheter is ready for immediate use. Electronically Signed   By: Aletta Edouard M.D.   On: 06/15/2021 10:23    Scheduled Meds:  Chlorhexidine Gluconate Cloth  6 each Topical Q0600   [START ON 06/20/2021] darbepoetin (ARANESP) injection - NON-DIALYSIS  40 mcg Subcutaneous Q Sat-1800   heparin  5,000 Units Subcutaneous Q8H   heparin sodium (porcine)       insulin aspart  0-9 Units Subcutaneous TID WC   lidocaine       potassium chloride  40 mEq Oral Once   pravastatin  20 mg Oral Daily   sevelamer carbonate  800 mg Oral TID WC   Continuous Infusions:  furosemide 120 mg (06/14/21 1836)     LOS: 3 days   Shelly Coss, MD [facility] P2/13/2023, 1:22 PM

## 2021-06-16 ENCOUNTER — Ambulatory Visit: Payer: Medicare HMO | Admitting: Student

## 2021-06-16 ENCOUNTER — Inpatient Hospital Stay (HOSPITAL_COMMUNITY): Payer: Medicare HMO

## 2021-06-16 DIAGNOSIS — N179 Acute kidney failure, unspecified: Secondary | ICD-10-CM | POA: Diagnosis not present

## 2021-06-16 DIAGNOSIS — N186 End stage renal disease: Secondary | ICD-10-CM

## 2021-06-16 DIAGNOSIS — Z992 Dependence on renal dialysis: Secondary | ICD-10-CM

## 2021-06-16 DIAGNOSIS — R5381 Other malaise: Secondary | ICD-10-CM

## 2021-06-16 DIAGNOSIS — N189 Chronic kidney disease, unspecified: Secondary | ICD-10-CM | POA: Diagnosis not present

## 2021-06-16 LAB — GLUCOSE, CAPILLARY
Glucose-Capillary: 105 mg/dL — ABNORMAL HIGH (ref 70–99)
Glucose-Capillary: 118 mg/dL — ABNORMAL HIGH (ref 70–99)
Glucose-Capillary: 204 mg/dL — ABNORMAL HIGH (ref 70–99)

## 2021-06-16 LAB — HEPATITIS B DNA, ULTRAQUANTITATIVE, PCR
HBV DNA SERPL PCR-ACNC: NOT DETECTED IU/mL
HBV DNA SERPL PCR-LOG IU: UNDETERMINED log10 IU/mL

## 2021-06-16 LAB — IRON AND TIBC
Iron: 32 ug/dL (ref 28–170)
Saturation Ratios: 11 % (ref 10.4–31.8)
TIBC: 283 ug/dL (ref 250–450)
UIBC: 251 ug/dL

## 2021-06-16 LAB — CBC
HCT: 28.6 % — ABNORMAL LOW (ref 36.0–46.0)
Hemoglobin: 8.8 g/dL — ABNORMAL LOW (ref 12.0–15.0)
MCH: 29 pg (ref 26.0–34.0)
MCHC: 30.8 g/dL (ref 30.0–36.0)
MCV: 94.4 fL (ref 80.0–100.0)
Platelets: 125 10*3/uL — ABNORMAL LOW (ref 150–400)
RBC: 3.03 MIL/uL — ABNORMAL LOW (ref 3.87–5.11)
RDW: 15.9 % — ABNORMAL HIGH (ref 11.5–15.5)
WBC: 5.8 10*3/uL (ref 4.0–10.5)
nRBC: 0 % (ref 0.0–0.2)

## 2021-06-16 LAB — HEPATITIS B SURFACE ANTIBODY, QUANTITATIVE: Hep B S AB Quant (Post): <3.1 m[IU]/mL — ABNORMAL LOW (ref >9.9)

## 2021-06-16 LAB — RENAL FUNCTION PANEL
Albumin: 3.2 g/dL — ABNORMAL LOW (ref 3.5–5.0)
Anion gap: 14 (ref 5–15)
BUN: 34 mg/dL — ABNORMAL HIGH (ref 8–23)
CO2: 23 mmol/L (ref 22–32)
Calcium: 8.8 mg/dL — ABNORMAL LOW (ref 8.9–10.3)
Chloride: 103 mmol/L (ref 98–111)
Creatinine, Ser: 4.8 mg/dL — ABNORMAL HIGH (ref 0.44–1.00)
GFR, Estimated: 9 mL/min — ABNORMAL LOW (ref >60)
Glucose, Bld: 123 mg/dL — ABNORMAL HIGH (ref 70–99)
Phosphorus: 4.2 mg/dL (ref 2.5–4.6)
Potassium: 3.5 mmol/L (ref 3.5–5.1)
Sodium: 140 mmol/L (ref 135–145)

## 2021-06-16 LAB — FERRITIN: Ferritin: 142 ng/mL (ref 11–307)

## 2021-06-16 MED ORDER — CHLORHEXIDINE GLUCONATE CLOTH 2 % EX PADS
6.0000 | MEDICATED_PAD | Freq: Every day | CUTANEOUS | Status: DC
  Administered 2021-06-16 – 2021-06-17 (×2): 6 via TOPICAL

## 2021-06-16 MED ORDER — PREDNISONE 20 MG PO TABS: 40.0000 mg | ORAL_TABLET | Freq: Every day | ORAL | Status: DC

## 2021-06-16 MED ORDER — PREDNISONE 20 MG PO TABS
40.0000 mg | ORAL_TABLET | Freq: Every day | ORAL | Status: DC
  Administered 2021-06-16: 40 mg via ORAL
  Filled 2021-06-16: qty 2

## 2021-06-16 MED ORDER — PREDNISONE 20 MG PO TABS
40.0000 mg | ORAL_TABLET | Freq: Every day | ORAL | Status: AC
Start: 2021-06-17 — End: 2021-06-19
  Administered 2021-06-17: 40 mg via ORAL
  Filled 2021-06-16 (×2): qty 2

## 2021-06-16 MED ORDER — SODIUM CHLORIDE 0.9 % IV SOLN
250.0000 mg | Freq: Every day | INTRAVENOUS | Status: AC
  Administered 2021-06-16 – 2021-06-17 (×2): 250 mg via INTRAVENOUS
  Filled 2021-06-16 (×2): qty 20

## 2021-06-16 NOTE — Progress Notes (Addendum)
PROGRESS NOTE  [name]  EZM:[id number] DOB: Feb 05, 1941 DOA: 06/12/2021 PCP: Trey Sailors, PA   Brief Narrative:  [name] is a 81 y.o. female with medical history significant of CKD stage IV, hypertension, hyperlipidemia, diabetes type 2, paroxysmal A-fib not on anticoagulation who was sent from nephrology office to the ER for the evaluation of worsening kidney function, bilateral lower extremity swelling.  Lab work showed severe elevated creatinine in the range of 6.  Nephrology consulted.  Started on IV diuretics.  Flu test came to be positive. Placement of tunneled hemodialysis catheter on 2/13.  Started on dialysis on 2/13.  Renal navigator following for arranging outpatient dialysis.  Vascular surgery consulted for permanent access placement.    Assessment & Plan:  Principal Problem:   Acute kidney injury superimposed on chronic kidney disease ([place]) Active Problems:   Acute on chronic diastolic CHF (congestive heart failure) (HCC)   Essential hypertension   Hyperlipidemia   Influenza A   DM (diabetes mellitus), type 2 with renal complications (HCC)   Normocytic anemia   AKI (acute kidney injury) ([place])   UTI (urinary tract infection)   Debility   Assessment and Plan: * Acute kidney injury superimposed on chronic kidney disease ([place])- (present on admission) History of CKD stage IV.  She was admitted and was discharged on 1/26 / 23 for the same.  Creatinine worsening over the past year.  Most recent baseline creatinine around 2. 5-3. Volume overloaded on presentation.  Creatinine in the range of 7. On her last admission, her AKI was thought to be secondary to NSAIDs use, ARB use, borderline hypotension.  She was being followed by [facility] as an outpatient and was sent for admission here after she was seen in the office. Started on diuretics with improvement in the volume status.  Nephrology following.  Placement of tunneled hemodialysis catheter on 2/13.   Started on dialysis on 2/13.  Renal navigator following for arranging outpatient dialysis.  Vascular surgery consulted for permanent access placement.  Acute on chronic diastolic CHF (congestive heart failure) ([place]) Presented with volume overload.  Chest x-ray showed pulmonary vascular congestion, bilateral small pleural effusion.  Started on diuretics.  Continue to monitor input/output, daily weight. Echo done on 12/26/2020 had shown EF of 60 to 50%, grade 3 diastolic dysfunction, moderate mitral regurgitation, severe tricuspid regurgitation, moderate pulmonary regurgitation, moderate pulmonary hypertension. She had significant diuresis with significant improvement in the anasarca  Debility PT/OT consulted  UTI (urinary tract infection) UA showed cloudy urine with large leukocytes, many bacteria.  Urine culture has been sent.  Empirically started on ceftriaxone but now discontinued because of finding of multiple species in the urine culture  Normocytic anemia This is most likely associated with CKD.  Hemoglobin is stable in the range of 8, near baseline. Patient also has a stable thrombocytopenia. Monitor CBC  DM (diabetes mellitus), type 2 with renal complications ([place])- (present on admission) Takes insulin at home.  Monitor blood sugars.  Continue sliding scale insulin for now.  Influenza A She was wheezing today.  Saturating fine on room air.  Started on Tamiflu.  Started on 3 days course of prednisone for persistent wheezing.  Hyperlipidemia- (present on admission) Takes pravastatin which we will continue.  Essential hypertension- (present on admission) Currently normotensive.  Takes amlodipine 5 mg, Imdur 30 mg at home .  We will hold these medicines for now              DVT prophylaxis:heparin injection 5,000  Units Start: 06/12/21 1700     Code Status: Full Code  Family Communication: Daughter on phone on 2/13  Patient status: Inpatient  Patient is from :  Home  Anticipated discharge to: Home, also depends upon PT evaluation  Estimated DC date: Not sure at this point,needs clearance from nephrology.  Likely next 2 to 3 days   Consultants: Nephrology  Procedures: None   Antimicrobials:  Anti-infectives (From admission, onward)    Start     Dose/Rate Route Frequency Ordered Stop   06/15/21 0930  ceFAZolin (ANCEF) IVPB 2g/100 mL premix        2 g 200 mL/hr over 30 Minutes Intravenous To Radiology 06/15/21 0831 06/15/21 1503   06/13/21 1000  cefTRIAXone (ROCEPHIN) 1 g in sodium chloride 0.9 % 100 mL IVPB  Status:  Discontinued        1 g 200 mL/hr over 30 Minutes Intravenous Every 24 hours 06/13/21 0829 06/15/21 0807   06/12/21 2245  oseltamivir (TAMIFLU) capsule 30 mg        30 mg Oral Every other day 06/12/21 2147 06/14/21 8546       Subjective:  Patient seen and examined at the bedside this morning.  Hemodynamically stable.  Appears comfortable.  She had current dialysis catheter placement yesterday.  Had dialysis session.  During my evaluation today, she was not in any kind of respite distress but was wheezing.  Objective: Vitals:   06/15/21 2020 06/16/21 0033 06/16/21 0500 06/16/21 0755  BP: (!) 159/84 131/80 131/85 131/69  Pulse: 92 88 83 83  Resp: 16 19 13 15   Temp: 98.4 F (36.9 C) 98 F (36.7 C) 98 F (36.7 C) 98.4 F (36.9 C)  TempSrc:  Oral Oral Oral  SpO2: 100%     Weight: 81.4 kg  77.5 kg   Height:        Intake/Output Summary (Last 24 hours) at 06/16/2021 1157 Last data filed at 06/16/2021 2703 Gross per 24 hour  Intake 100 ml  Output 652 ml  Net -552 ml   Filed Weights   06/15/21 0500 06/15/21 2020 06/16/21 0500  Weight: 83.6 kg 81.4 kg 77.5 kg    Examination:  General exam: Overall comfortable, not in distress, pleasant elderly female HEENT: PERRL Respiratory system: Bilateral expiratory wheezing  cardiovascular system: S1 & S2 heard, RRR.  Dialysis catheter on right chest Gastrointestinal  system: Abdomen is nondistended, soft and nontender. Central nervous system: Alert and oriented Extremities: Trace bilateral lower extremity edema, no clubbing ,no cyanosis Skin: No rashes, no ulcers,no icterus    Data Reviewed: I have personally reviewed following labs and imaging studies  CBC: Recent Labs  Lab 06/12/21 1141 06/13/21 0024 06/14/21 0146 06/15/21 1734 06/16/21 0108  WBC 4.6 4.3 4.1 5.0 5.8  NEUTROABS 2.5  --  2.3  --   --   HGB 8.7* 7.9* 8.4* 8.6* 8.8*  HCT 28.8* 25.6* 26.5* 26.7* 28.6*  MCV 96.0 94.5 93.0 92.7 94.4  PLT 143* 135* 146* 150 500*   Basic Metabolic Panel: Recent Labs  Lab 06/12/21 1141 06/13/21 0024 06/14/21 0146 06/15/21 0551 06/16/21 0108  NA 139 140 139 140 140  K 3.9 3.7 3.5 3.0* 3.5  CL 106 107 106 105 103  CO2 21* 20* 20* 22 23  GLUCOSE 115* 147* 136* 79 123*  BUN 55* 52* 51* 50* 34*  CREATININE 6.91* 6.51* 6.37* 6.15* 4.80*  CALCIUM 8.4* 8.4* 8.6* 8.4* 8.8*  PHOS  --  6.3*  --   --  4.2     Recent Results (from the past 240 hour(s))  Resp Panel by RT-PCR (Flu A&B, Covid) Nasopharyngeal Swab     Status: Abnormal   Collection Time: 06/12/21  6:01 PM   Specimen: Nasopharyngeal Swab; Nasopharyngeal(NP) swabs in vial transport medium  Result Value Ref Range Status   SARS Coronavirus 2 by RT PCR NEGATIVE NEGATIVE Final    Comment: (NOTE) SARS-CoV-2 target nucleic acids are NOT DETECTED.  The SARS-CoV-2 RNA is generally detectable in upper respiratory specimens during the acute phase of infection. The lowest concentration of SARS-CoV-2 viral copies this assay can detect is 138 copies/mL. A negative result does not preclude SARS-Cov-2 infection and should not be used as the sole basis for treatment or other patient management decisions. A negative result may occur with  improper specimen collection/handling, submission of specimen other than nasopharyngeal swab, presence of viral mutation(s) within the areas targeted by this  assay, and inadequate number of viral copies(<138 copies/mL). A negative result must be combined with clinical observations, patient history, and epidemiological information. The expected result is Negative.  Fact Sheet for Patients:  EntrepreneurPulse.com.au  Fact Sheet for Healthcare Providers:  IncredibleEmployment.be  This test is no t yet approved or cleared by the [place] FDA and  has been authorized for detection and/or diagnosis of SARS-CoV-2 by FDA under an Emergency Use Authorization (EUA). This EUA will remain  in effect (meaning this test can be used) for the duration of the COVID-19 declaration under Section 564(b)(1) of the Act, 21 U.S.C.section 360bbb-3(b)(1), unless the authorization is terminated  or revoked sooner.       Influenza A by PCR POSITIVE (A) NEGATIVE Final   Influenza B by PCR NEGATIVE NEGATIVE Final    Comment: (NOTE) The Xpert Xpress SARS-CoV-2/FLU/RSV plus assay is intended as an aid in the diagnosis of influenza from Nasopharyngeal swab specimens and should not be used as a sole basis for treatment. Nasal washings and aspirates are unacceptable for Xpert Xpress SARS-CoV-2/FLU/RSV testing.  Fact Sheet for Patients: EntrepreneurPulse.com.au  Fact Sheet for Healthcare Providers: IncredibleEmployment.be  This test is not yet approved or cleared by the [place] FDA and has been authorized for detection and/or diagnosis of SARS-CoV-2 by FDA under an Emergency Use Authorization (EUA). This EUA will remain in effect (meaning this test can be used) for the duration of the COVID-19 declaration under Section 564(b)(1) of the Act, 21 U.S.C. section 360bbb-3(b)(1), unless the authorization is terminated or revoked.  Performed at [facility], [place] [address], [place], [place] [address]   Urine Culture     Status: Abnormal   Collection Time: 06/13/21  2:25 PM    Specimen: Urine, Clean Catch  Result Value Ref Range Status   Specimen Description URINE, CLEAN CATCH  Final   Special Requests   Final    NONE Performed at [facility], [place] [address] [place], [place] [address]    Culture MULTIPLE SPECIES PRESENT, SUGGEST RECOLLECTION (A)  Final   Report Status 06/14/2021 FINAL  Final     Radiology Studies: IR Fluoro Guide CV Line Right  Result Date: 06/15/2021 CLINICAL DATA:  Renal failure and need for tunneled hemodialysis catheter. EXAM: TUNNELED CENTRAL VENOUS HEMODIALYSIS CATHETER PLACEMENT WITH ULTRASOUND AND FLUOROSCOPIC GUIDANCE ANESTHESIA/SEDATION: Moderate (conscious) sedation was employed during this procedure. A total of Versed 1.0 mg and Fentanyl 25 mcg was administered intravenously by radiology nursing. Moderate Sedation Time: 25 minutes. The patient's level of consciousness and vital signs were  monitored continuously by radiology nursing throughout the procedure under my direct supervision. MEDICATIONS: 2 g IV Ancef. FLUOROSCOPY: 30 seconds.  2.0 mGy. PROCEDURE: The procedure, risks, benefits, and alternatives were explained to the patient. Questions regarding the procedure were encouraged and answered. The patient understands and consents to the procedure. A timeout was performed prior to initiating the procedure. The right neck and chest were prepped with chlorhexidine in a sterile fashion, and a sterile drape was applied covering the operative field. Maximum barrier sterile technique with sterile gowns and gloves were used for the procedure. Local anesthesia was provided with 1% lidocaine. Ultrasound was used to confirm patency of the right internal jugular vein. A permanent ultrasound image was recorded. After creating a small venotomy incision, a 21 gauge needle was advanced into the right internal jugular vein under direct, real-time ultrasound guidance. Ultrasound image documentation was performed. After securing guidewire access, an 8  Fr dilator was placed. A J-wire was kinked to measure appropriate catheter length. A Palindrome tunneled hemodialysis catheter measuring 19 cm from tip to cuff was chosen for placement. This was tunneled in a retrograde fashion from the chest wall to the venotomy incision. At the venotomy, serial dilatation was performed and a 15 Fr peel-away sheath was placed over a guidewire. The catheter was then placed through the sheath and the sheath removed. Final catheter positioning was confirmed and documented with a fluoroscopic spot image. The catheter was aspirated, flushed with saline, and injected with appropriate volume heparin dwells. The venotomy incision was closed with subcuticular 4-0 Vicryl. Dermabond was applied to the incision. The catheter exit site was secured with 0-Prolene retention sutures. COMPLICATIONS: None.  No pneumothorax. FINDINGS: After catheter placement, the tip lies in the right atrium. The catheter aspirates normally and is ready for immediate use. IMPRESSION: Placement of tunneled hemodialysis catheter via the right internal jugular vein. The catheter tip lies in the right atrium. The catheter is ready for immediate use. Electronically Signed   By: Aletta Edouard M.D.   On: 06/15/2021 10:23   IR US Guide Vasc Access Right  Result Date: 06/15/2021 CLINICAL DATA:  Renal failure and need for tunneled hemodialysis catheter. EXAM: TUNNELED CENTRAL VENOUS HEMODIALYSIS CATHETER PLACEMENT WITH ULTRASOUND AND FLUOROSCOPIC GUIDANCE ANESTHESIA/SEDATION: Moderate (conscious) sedation was employed during this procedure. A total of Versed 1.0 mg and Fentanyl 25 mcg was administered intravenously by radiology nursing. Moderate Sedation Time: 25 minutes. The patient's level of consciousness and vital signs were monitored continuously by radiology nursing throughout the procedure under my direct supervision. MEDICATIONS: 2 g IV Ancef. FLUOROSCOPY: 30 seconds.  2.0 mGy. PROCEDURE: The procedure, risks,  benefits, and alternatives were explained to the patient. Questions regarding the procedure were encouraged and answered. The patient understands and consents to the procedure. A timeout was performed prior to initiating the procedure. The right neck and chest were prepped with chlorhexidine in a sterile fashion, and a sterile drape was applied covering the operative field. Maximum barrier sterile technique with sterile gowns and gloves were used for the procedure. Local anesthesia was provided with 1% lidocaine. Ultrasound was used to confirm patency of the right internal jugular vein. A permanent ultrasound image was recorded. After creating a small venotomy incision, a 21 gauge needle was advanced into the right internal jugular vein under direct, real-time ultrasound guidance. Ultrasound image documentation was performed. After securing guidewire access, an 8 Fr dilator was placed. A J-wire was kinked to measure appropriate catheter length. A Palindrome tunneled hemodialysis catheter measuring 19  cm from tip to cuff was chosen for placement. This was tunneled in a retrograde fashion from the chest wall to the venotomy incision. At the venotomy, serial dilatation was performed and a 15 Fr peel-away sheath was placed over a guidewire. The catheter was then placed through the sheath and the sheath removed. Final catheter positioning was confirmed and documented with a fluoroscopic spot image. The catheter was aspirated, flushed with saline, and injected with appropriate volume heparin dwells. The venotomy incision was closed with subcuticular 4-0 Vicryl. Dermabond was applied to the incision. The catheter exit site was secured with 0-Prolene retention sutures. COMPLICATIONS: None.  No pneumothorax. FINDINGS: After catheter placement, the tip lies in the right atrium. The catheter aspirates normally and is ready for immediate use. IMPRESSION: Placement of tunneled hemodialysis catheter via the right internal jugular  vein. The catheter tip lies in the right atrium. The catheter is ready for immediate use. Electronically Signed   By: Aletta Edouard M.D.   On: 06/15/2021 10:23    Scheduled Meds:  Chlorhexidine Gluconate Cloth  6 each Topical Q0600   Chlorhexidine Gluconate Cloth  6 each Topical Q0600   [START ON 06/19/2021] darbepoetin (ARANESP) injection - DIALYSIS  60 mcg Intravenous Q Fri-HD   heparin  5,000 Units Subcutaneous Q8H   insulin aspart  0-9 Units Subcutaneous TID WC   pravastatin  20 mg Oral Daily   [START ON 06/17/2021] predniSONE  40 mg Oral Q breakfast   sevelamer carbonate  800 mg Oral TID WC   Continuous Infusions:  ferric gluconate (FERRLECIT) IVPB 250 mg (06/16/21 1148)     LOS: 4 days   Shelly Coss, MD [facility] P2/14/2023, 11:57 AM

## 2021-06-16 NOTE — Assessment & Plan Note (Signed)
PT/OT consulted

## 2021-06-16 NOTE — Progress Notes (Signed)
Attempted upper extremity vein mapping, however patient is in the restroom, then will be working with physical therapy. Will attempt again as schedule permits.  06/16/2021 3:51 PM Kelby Aline., MHA, RVT, RDCS, RDMS

## 2021-06-16 NOTE — Progress Notes (Signed)
[facility] NEPHROLOGY PROGRESS NOTE  Assessment/ Plan: Pt is a 81 y.o. yo female with history of HTN, type II DM, A-fib, CKD stage V followed by Dr. Joelyn Oms for shortness of breath and fluid overload.  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 Status post right IJ TDC placed by IR on 2/13 and received first HD. Renal navigator is following to arrange outpatient HD.  Vein mapping ordered, consulted [facility] for perm access placement. Pt understands this and agrees with the perm access placement.  Plan for second HD today if  schedule allows.  #Fluid overload: Received IV diuretics without much response and now managing with dialysis.  #Influenza A: Continue albuterol and supportive care per primary team.  # Anemia of CKD: Iron saturation is 11% therefore I will order IV iron.  Continue Aranesp.  Monitor hemoglobin.   # Secondary hyperparathyroidism: PTH 202 at goal.  Hyperphosphatemia is managed with Renvela and HD.  # HTN/volume: Blood pressure acceptable, volume managed with dialysis.  Discontinued IV diuretics.  #Hypokalemia due to diuretics:  Managing with HD.  Subjective: Seen and examined.  She tolerated first dialysis well.  Denies nausea, vomiting, chest pain, shortness of breath. Objective Vital signs in last 24 hours: Vitals:   06/15/21 2020 06/16/21 0033 06/16/21 0500 06/16/21 0755  BP: (!) 159/84 131/80 131/85 131/69  Pulse: 92 88 83 83  Resp: 16 19 13 15   Temp: 98.4 F (36.9 C) 98 F (36.7 C) 98 F (36.7 C) 98.4 F (36.9 C)  TempSrc:  Oral Oral Oral  SpO2: 100%     Weight: 81.4 kg  77.5 kg   Height:       Weight change: -2.2 kg  Intake/Output Summary (Last 24 hours) at 06/16/2021 1013 Last data filed at 06/16/2021 0517 Gross per 24 hour  Intake 100 ml  Output 652 ml  Net -552 ml        Labs: Basic Metabolic Panel: Recent Labs  Lab 06/13/21 0024 06/14/21 0146 06/15/21 0551  06/16/21 0108  NA 140 139 140 140  K 3.7 3.5 3.0* 3.5  CL 107 106 105 103  CO2 20* 20* 22 23  GLUCOSE 147* 136* 79 123*  BUN 52* 51* 50* 34*  CREATININE 6.51* 6.37* 6.15* 4.80*  CALCIUM 8.4* 8.6* 8.4* 8.8*  PHOS 6.3*  --   --  4.2    Liver Function Tests: Recent Labs  Lab 06/12/21 1141 06/16/21 0108  AST 23  --   ALT 12  --   ALKPHOS 62  --   BILITOT 0.7  --   PROT 6.8  --   ALBUMIN 3.3* 3.2*    No results for input(s): LIPASE, AMYLASE in the last 168 hours. No results for input(s): AMMONIA in the last 168 hours. CBC: Recent Labs  Lab 06/12/21 1141 06/13/21 0024 06/14/21 0146 06/15/21 1734 06/16/21 0108  WBC 4.6 4.3 4.1 5.0 5.8  NEUTROABS 2.5  --  2.3  --   --   HGB 8.7* 7.9* 8.4* 8.6* 8.8*  HCT 28.8* 25.6* 26.5* 26.7* 28.6*  MCV 96.0 94.5 93.0 92.7 94.4  PLT 143* 135* 146* 150 125*    Cardiac Enzymes: No results for input(s): CKTOTAL, CKMB, CKMBINDEX, TROPONINI in the last 168 hours. CBG: Recent Labs  Lab 06/15/21 0753 06/15/21 1336 06/15/21 1604 06/15/21 2106 06/16/21 0756  GLUCAP 92 78 116* 115* 105*     Iron Studies:  Recent Labs  06/16/21 0108  IRON 32  TIBC 283  FERRITIN 142   Studies/Results: IR Fluoro Guide CV Line Right  Result Date: 06/15/2021 CLINICAL DATA:  Renal failure and need for tunneled hemodialysis catheter. EXAM: TUNNELED CENTRAL VENOUS HEMODIALYSIS CATHETER PLACEMENT WITH ULTRASOUND AND FLUOROSCOPIC GUIDANCE ANESTHESIA/SEDATION: Moderate (conscious) sedation was employed during this procedure. A total of Versed 1.0 mg and Fentanyl 25 mcg was administered intravenously by radiology nursing. Moderate Sedation Time: 25 minutes. The patient's level of consciousness and vital signs were monitored continuously by radiology nursing throughout the procedure under my direct supervision. MEDICATIONS: 2 g IV Ancef. FLUOROSCOPY: 30 seconds.  2.0 mGy. PROCEDURE: The procedure, risks, benefits, and alternatives were explained to the  patient. Questions regarding the procedure were encouraged and answered. The patient understands and consents to the procedure. A timeout was performed prior to initiating the procedure. The right neck and chest were prepped with chlorhexidine in a sterile fashion, and a sterile drape was applied covering the operative field. Maximum barrier sterile technique with sterile gowns and gloves were used for the procedure. Local anesthesia was provided with 1% lidocaine. Ultrasound was used to confirm patency of the right internal jugular vein. A permanent ultrasound image was recorded. After creating a small venotomy incision, a 21 gauge needle was advanced into the right internal jugular vein under direct, real-time ultrasound guidance. Ultrasound image documentation was performed. After securing guidewire access, an 8 Fr dilator was placed. A J-wire was kinked to measure appropriate catheter length. A Palindrome tunneled hemodialysis catheter measuring 19 cm from tip to cuff was chosen for placement. This was tunneled in a retrograde fashion from the chest wall to the venotomy incision. At the venotomy, serial dilatation was performed and a 15 Fr peel-away sheath was placed over a guidewire. The catheter was then placed through the sheath and the sheath removed. Final catheter positioning was confirmed and documented with a fluoroscopic spot image. The catheter was aspirated, flushed with saline, and injected with appropriate volume heparin dwells. The venotomy incision was closed with subcuticular 4-0 Vicryl. Dermabond was applied to the incision. The catheter exit site was secured with 0-Prolene retention sutures. COMPLICATIONS: None.  No pneumothorax. FINDINGS: After catheter placement, the tip lies in the right atrium. The catheter aspirates normally and is ready for immediate use. IMPRESSION: Placement of tunneled hemodialysis catheter via the right internal jugular vein. The catheter tip lies in the right atrium.  The catheter is ready for immediate use. Electronically Signed   By: Aletta Edouard M.D.   On: 06/15/2021 10:23   IR US Guide Vasc Access Right  Result Date: 06/15/2021 CLINICAL DATA:  Renal failure and need for tunneled hemodialysis catheter. EXAM: TUNNELED CENTRAL VENOUS HEMODIALYSIS CATHETER PLACEMENT WITH ULTRASOUND AND FLUOROSCOPIC GUIDANCE ANESTHESIA/SEDATION: Moderate (conscious) sedation was employed during this procedure. A total of Versed 1.0 mg and Fentanyl 25 mcg was administered intravenously by radiology nursing. Moderate Sedation Time: 25 minutes. The patient's level of consciousness and vital signs were monitored continuously by radiology nursing throughout the procedure under my direct supervision. MEDICATIONS: 2 g IV Ancef. FLUOROSCOPY: 30 seconds.  2.0 mGy. PROCEDURE: The procedure, risks, benefits, and alternatives were explained to the patient. Questions regarding the procedure were encouraged and answered. The patient understands and consents to the procedure. A timeout was performed prior to initiating the procedure. The right neck and chest were prepped with chlorhexidine in a sterile fashion, and a sterile drape was applied covering the operative field. Maximum barrier sterile technique with sterile gowns  and gloves were used for the procedure. Local anesthesia was provided with 1% lidocaine. Ultrasound was used to confirm patency of the right internal jugular vein. A permanent ultrasound image was recorded. After creating a small venotomy incision, a 21 gauge needle was advanced into the right internal jugular vein under direct, real-time ultrasound guidance. Ultrasound image documentation was performed. After securing guidewire access, an 8 Fr dilator was placed. A J-wire was kinked to measure appropriate catheter length. A Palindrome tunneled hemodialysis catheter measuring 19 cm from tip to cuff was chosen for placement. This was tunneled in a retrograde fashion from the chest wall  to the venotomy incision. At the venotomy, serial dilatation was performed and a 15 Fr peel-away sheath was placed over a guidewire. The catheter was then placed through the sheath and the sheath removed. Final catheter positioning was confirmed and documented with a fluoroscopic spot image. The catheter was aspirated, flushed with saline, and injected with appropriate volume heparin dwells. The venotomy incision was closed with subcuticular 4-0 Vicryl. Dermabond was applied to the incision. The catheter exit site was secured with 0-Prolene retention sutures. COMPLICATIONS: None.  No pneumothorax. FINDINGS: After catheter placement, the tip lies in the right atrium. The catheter aspirates normally and is ready for immediate use. IMPRESSION: Placement of tunneled hemodialysis catheter via the right internal jugular vein. The catheter tip lies in the right atrium. The catheter is ready for immediate use. Electronically Signed   By: Aletta Edouard M.D.   On: 06/15/2021 10:23    Medications: Infusions:    Scheduled Medications:  Chlorhexidine Gluconate Cloth  6 each Topical Q0600   Chlorhexidine Gluconate Cloth  6 each Topical Q0600   [START ON 06/19/2021] darbepoetin (ARANESP) injection - DIALYSIS  60 mcg Intravenous Q Fri-HD   heparin  5,000 Units Subcutaneous Q8H   insulin aspart  0-9 Units Subcutaneous TID WC   pravastatin  20 mg Oral Daily   predniSONE  40 mg Oral Q breakfast   sevelamer carbonate  800 mg Oral TID WC    have reviewed scheduled and prn medications.  Physical Exam: General:NAD, comfortable Heart:RRR, s1s2 nl Lungs: Clear b/l, no crackle Abdomen:soft, Non-tender, non-distended Extremities:Some LE edema+ Dialysis Access: Right IJ TDC in place.  [name] 06/16/2021,10:13 AM  LOS: 4 days

## 2021-06-16 NOTE — Evaluation (Signed)
Physical Therapy Evaluation Patient Details Name: Faatimah Spielberg MRN: [id number] DOB: 1941-05-01 Today's Date: 06/16/2021  History of Present Illness  Pt is 81 yo female admitted on 06/12/21 for worsening kidney function and bil LE edema; also found to be + for the flu.  Pt had tunneled hemodialysis catheter placed on 2/13 and started dialysis.  Has vascular consult for permanent access placement.  Clinical Impression  Pt admitted with above diagnosis. At baseline, pt is independent with ADLs and ambulates short community distances -sometimes uses rollator.  She lives with family.  Today, pt requiring min A for transfers but only able to ambulate 5' at a time due to fatigue.  She was on RA with stable sats.  Pt wheezing at arrival but improved once sitting and with better positioning.  Family present throughout eval and felt they can provide current level of assist.  Pt currently with functional limitations due to the deficits listed below (see PT Problem List). Pt will benefit from skilled PT to increase their independence and safety with mobility to allow discharge to the venue listed below.          Recommendations for follow up therapy are one component of a multi-disciplinary discharge planning process, led by the attending physician.  Recommendations may be updated based on patient status, additional functional criteria and insurance authorization.  Follow Up Recommendations Home health PT    Assistance Recommended at Discharge Frequent or constant Supervision/Assistance  Patient can return home with the following  A little help with walking and/or transfers;A little help with bathing/dressing/bathroom;Assistance with Therapist, art (measurements PT);BSC/3in1;Wheelchair (measurements PT);Other (comment) (requesting different hospital bed or rails? state pt slides out of current bed)  Recommendations for Other Services       Functional  Status Assessment Patient has had a recent decline in their functional status and demonstrates the ability to make significant improvements in function in a reasonable and predictable amount of time.     Precautions / Restrictions Precautions Precautions: Fall      Mobility  Bed Mobility Overal bed mobility: Needs Assistance Bed Mobility: Supine to Sit, Sit to Supine     Supine to sit: Min assist, HOB elevated Sit to supine: Min assist, HOB elevated   General bed mobility comments: Increased time and cues with use of bed rail    Transfers Overall transfer level: Needs assistance Equipment used: Rolling walker (2 wheels) Transfers: Sit to/from Stand, Bed to chair/wheelchair/BSC Sit to Stand: Min assist   Step pivot transfers: Min assist       General transfer comment: Stood from bed x 4 with min A to rise and repeated cues for hand placement; Pt had BM in bed, was cleaned in standing with max A for ADLs.  Pt having further BM when stepping toward HOB so had return to sitting then stand pivot to [facility] with min A and RW    Ambulation/Gait Ambulation/Gait assistance: Min assist Gait Distance (Feet): 5 Feet (5'x3) Assistive device: Rolling walker (2 wheels) Gait Pattern/deviations: Step-through pattern, Shuffle, Decreased stride length Gait velocity: decreased     General Gait Details: Pt ambulating 5' x 3 easily fatiguing ; requiring min A for balance and RW; cues for posture  Stairs            Wheelchair Mobility    Modified Rankin (Stroke Patients Only)       Balance Overall balance assessment: Needs assistance Sitting-balance support: No upper extremity supported, Feet supported Sitting  balance-Leahy Scale: Good     Standing balance support: Bilateral upper extremity supported, Reliant on assistive device for balance Standing balance-Leahy Scale: Poor Standing balance comment: Requiring RW; tendency to posterior lean intially improving with cues; only  standing 1-2 mins at a time                             Pertinent Vitals/Pain Pain Assessment Pain Assessment: No/denies pain    Home Living Family/patient expects to be discharged to:: Private residence Living Arrangements: Children Available Help at Discharge: Family;Available 24 hours/day Type of Home: House Home Access: Stairs to enter Entrance Stairs-Rails: Can reach both Entrance Stairs-Number of Steps: 3   Home Layout: Able to live on main level with bedroom/bathroom;Two level Home Equipment: Rollator (4 wheels);Shower seat;Hospital bed Additional Comments: Reports needs BSC.  PT discussed possible w/c and they agree.  Also, reports pt tends to slide out of her hospital bed - asking about other options - different bed, rails, etc -will mention to case manager    Prior Function Prior Level of Function : Needs assist             Mobility Comments: Pt reports could ambulate in house and short community distances.  Occasionally, used rollator.  Had help on stairs ADLs Comments: Independent with ADLs; needed assist with IADLs     Hand Dominance   Dominant Hand: Right    Extremity/Trunk Assessment   Upper Extremity Assessment Upper Extremity Assessment: Generalized weakness (defer to OT for further detail)    Lower Extremity Assessment Lower Extremity Assessment: LLE deficits/detail;RLE deficits/detail RLE Deficits / Details: ROM WFL; MMT 4/5 LLE Deficits / Details: ROM WFL; MMT 4/5    Cervical / Trunk Assessment Cervical / Trunk Assessment: Normal  Communication   Communication: No difficulties  Cognition Arousal/Alertness: Awake/alert Behavior During Therapy: WFL for tasks assessed/performed Overall Cognitive Status: Within Functional Limits for tasks assessed                                          General Comments General comments (skin integrity, edema, etc.): At arrival, pt with c/o difficutly to breath.  Noted pt wheezing  (per chart has been wheezing), sats 95% on RA, but pt slouched down in bed.  Moved pt to EOB and wheezing resolved and reports easier to breath.  She was able to work with therapy for several transfers and short bouts of ambulation with sats 93%.  Performed toielting ADLs in standing but pt having further BM so placed on [facility] and notified RN.  Family present with pt.  Discussed d/c plans with pt and family.  Family wishes to take pt home and feel they can provide current level of assist.    Exercises     Assessment/Plan    PT Assessment Patient needs continued PT services  PT Problem List Decreased strength;Decreased mobility;Decreased safety awareness;Decreased activity tolerance;Decreased knowledge of precautions;Decreased balance;Decreased knowledge of use of DME;Pain;Cardiopulmonary status limiting activity;Decreased range of motion       PT Treatment Interventions DME instruction;Therapeutic activities;Gait training;Therapeutic exercise;Patient/family education;Functional mobility training;Balance training;Stair training    PT Goals (Current goals can be found in the Care Plan section)  Acute Rehab PT Goals Patient Stated Goal: to go home PT Goal Formulation: With patient/family Time For Goal Achievement: 06/30/21 Potential to Achieve Goals: Good  Frequency Min 3X/week     Co-evaluation               AM-PAC PT "6 Clicks" Mobility  Outcome Measure Help needed turning from your back to your side while in a flat bed without using bedrails?: A Little Help needed moving from lying on your back to sitting on the side of a flat bed without using bedrails?: A Little Help needed moving to and from a bed to a chair (including a wheelchair)?: A Little Help needed standing up from a chair using your arms (e.g., wheelchair or bedside chair)?: A Little Help needed to walk in hospital room?: Total (<20') Help needed climbing 3-5 steps with a railing? : Total 6 Click Score: 14     End of Session Equipment Utilized During Treatment: Gait belt Activity Tolerance: Patient limited by fatigue Patient left: Other (comment) (On BSC, family presetn, nurse notified) Nurse Communication: Mobility status PT Visit Diagnosis: Unsteadiness on feet (R26.81);Muscle weakness (generalized) (M62.81)    Time: 0037-9444 PT Time Calculation (min) (ACUTE ONLY): 27 min   Charges:   PT Evaluation $PT Eval Low Complexity: 1 Low PT Treatments $Therapeutic Activity: 8-22 mins        Abran Richard, PT Acute Rehab Services Pager [phone] [facility] [place] 06/16/2021, 4:11 PM

## 2021-06-16 NOTE — Consult Note (Addendum)
Hospital Consult  VASCULAR SURGERY ASSESSMENT & PLAN:   END-STAGE RENAL DISEASE: We have been asked to place hemodialysis access.  She has a functioning right IJ tunneled dialysis catheter.  Her vein map is pending.  Pending the results of her vein map, we will remove the IV from her left arm and plan placement of a left AV fistula or AV graft on Thursday.  It looks like she is on a Monday Wednesday Friday dialysis schedule. I have explained the indications for placement of an AV fistula or AV graft. I've explained that if at all possible we will place an AV fistula.  I have reviewed the risks of placement of an AV fistula including but not limited to: failure of the fistula to mature, need for subsequent interventions, and thrombosis. In addition I have reviewed the potential complications of placement of an AV graft. These risks include, but are not limited to, graft thrombosis, graft infection, wound healing problems, bleeding, arm swelling, and steal syndrome. All the patient's questions were answered and they are agreeable to proceed with surgery.  Please do not dialyze the patient on Thursday as she is scheduled for surgery.  Thank you.  Gae Gallop, MD 1:44 PM    Reason for Consult:  permanent dialysis access Requesting Physician:  Carolin Sicks MRN #:  [id number]  History of Present Illness: This is a 81 y.o. female who presented to the hospital a few days ago with SOB and significant leg swelling.  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  She had TDC placed yesterday with IR and VVS is consulted for permanent dialysis access.  She is right hand dominant.  She denies any claudication, non healing wounds or rest pain.  Family says swelling in legs improved since HD treatment yesterday.  She was seen in our office last year for venous insufficiency.  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  Compression and elevation were recommended.    The pt is on a statin for cholesterol management.  The pt is not on a daily aspirin.   Other AC:  none PTA currently sq heparin The pt is not currently on medication for hypertension.   The pt is diabetic.   Tobacco hx:  never  Past Medical History:  Diagnosis Date   Diabetes mellitus without complication ([place])    Hypertension     Past Surgical History:  Procedure Laterality Date   CARDIOVERSION N/A 01/14/2021   Procedure: CARDIOVERSION;  Surgeon: Adrian Prows, MD;  Location: [facility];  Service: Cardiovascular;  Laterality: N/A;   IR FLUORO GUIDE CV LINE RIGHT  06/15/2021   IR US GUIDE VASC ACCESS RIGHT  06/15/2021   TONSILLECTOMY      Allergies  Allergen Reactions   Amlodipine Other (See Comments)    Patient has LE edema to this med at higher doses (10 mg)    Prior to Admission medications   Medication Sig Start Date End Date Taking? Authorizing Provider  acetaminophen (TYLENOL) 500 MG tablet Take 500 mg by mouth every 6 (six) hours as needed for mild pain, fever or headache.   Yes [provider]  amLODipine (NORVASC) 5 MG tablet Take 1 tablet (5 mg total) by mouth daily. 05/28/21  Yes Regalado, Belkys A, MD  HUMULIN 70/30 KWIKPEN (70-30) 100 UNIT/ML KwikPen Inject 5 Units into the skin  daily. 05/28/21  Yes Regalado, Belkys A, MD  isosorbide dinitrate (ISORDIL) 30 MG tablet Take 1 tablet (30 mg total) by mouth 2 (two) times daily. 05/28/21  Yes Regalado, Belkys A, MD  Menthol-Methyl Salicylate (MUSCLE RUB EX) Apply 1 application topically daily as needed (leg cramps).   Yes [provider]  Accu-Chek FastClix Lancets MISC See admin instructions. 01/16/19   [provider]  BD PEN NEEDLE NANO 2ND GEN 32G X 4 MM MISC 3 (three) times daily. as directed 08/13/19   [provider]  Blood Glucose Monitoring Suppl (ACCU-CHEK GUIDE ME) w/Device KIT See admin instructions.  12/11/18   [provider]  diltiazem (CARDIZEM CD) 180 MG 24 hr capsule Take 180 mg by mouth daily. Patient not taking: Reported on 06/14/2021 05/16/21   [provider]  pravastatin (PRAVACHOL) 20 MG tablet Take 1 tablet (20 mg total) by mouth daily. 05/12/21   Cantwell, Celeste C, PA-C  sodium bicarbonate 650 MG tablet Take 1 tablet (650 mg total) by mouth 2 (two) times daily. Patient not taking: Reported on 06/14/2021 05/28/21   Elmarie Shiley, MD    Social History   Socioeconomic History   Marital status: Divorced    Spouse name: Not on file   Number of children: 5   Years of education: Not on file   Highest education level: Not on file  Occupational History   Not on file  Tobacco Use   Smoking status: Never   Smokeless tobacco: Never  Vaping Use   Vaping Use: Never used  Substance and Sexual Activity   Alcohol use: No   Drug use: No   Sexual activity: Not on file  Other Topics Concern   Not on file  Social History Narrative   4 living children   Social Determinants of Health   Financial Resource Strain: Not on file  Food Insecurity: Not on file  Transportation Needs: Not on file  Physical Activity: Not on file  Stress: Not on file  Social Connections: Not on file  Intimate Partner Violence: Not on file     Family History  Family history unknown: Yes    ROS: _0  Positive   _1  Negative   _2  All sytems reviewed and are negative  Cardiac: _3  SOB _4  DOE  Vascular: _5  pain in legs while walking _6  swelling in legs  Pulmonary: _7  wheezing better since HD _8  influenza A  Neurologic: _9  hx of CVA   Hematologic: _10  anemia   Endocrine:   _11  diabetes _12  thyroid disease  GI _13  GERD  GU: _14  CKD/renal failure _15  HD  Psychiatric: _16  anxiety _17  depression  Musculoskeletal: _18  arthritis _19  joint pain  Integumentary: _20  rashes _21  ulcers  Constitutional: _22  fever  _23  chills  Physical Examination  Vitals:    06/16/21 0500 06/16/21 0755  BP: 131/85 131/69  Pulse: 83 83  Resp: 13 15  Temp: 98 F (36.7 C) 98.4 F (36.9 C)  SpO2:     Body mass index is 29.33 kg/m.  General:  WDWN in NAD Gait: Not observed HENT: WNL, normocephalic Pulmonary: normal non-labored breathing Cardiac: regular, without Murmur; without carotid bruits Skin: with rashes Vascular Exam/Pulses: Bilateral radial and ulnar pulses are palpable; pedal pulses are not palpable Extremities: IV left hand; minimal swelling BLE currently Musculoskeletal: no muscle wasting or atrophy  Neurologic: A&O X 3; speech is fluent/normal Psychiatric:  The pt has Normal affect.   CBC    Component Value Date/Time  WBC 5.8 06/16/2021 0108   RBC 3.03 (L) 06/16/2021 0108   HGB 8.8 (L) 06/16/2021 0108   HGB 9.3 (L) 01/13/2021 1143   HCT 28.6 (L) 06/16/2021 0108   HCT 28.5 (L) 01/13/2021 1143   PLT 125 (L) 06/16/2021 0108   PLT 165 01/13/2021 1143   MCV 94.4 06/16/2021 0108   MCV 87 01/13/2021 1143   MCH 29.0 06/16/2021 0108   MCHC 30.8 06/16/2021 0108   RDW 15.9 (H) 06/16/2021 0108   RDW 14.2 01/13/2021 1143   LYMPHSABS 0.9 06/14/2021 0146   MONOABS 0.6 06/14/2021 0146   EOSABS 0.2 06/14/2021 0146   BASOSABS 0.0 06/14/2021 0146    BMET    Component Value Date/Time   NA 140 06/16/2021 0108   NA 140 05/20/2021 1159   K 3.5 06/16/2021 0108   CL 103 06/16/2021 0108   CO2 23 06/16/2021 0108   GLUCOSE 123 (H) 06/16/2021 0108   BUN 34 (H) 06/16/2021 0108   BUN 37 (H) 05/20/2021 1159   CREATININE 4.80 (H) 06/16/2021 0108   CALCIUM 8.8 (L) 06/16/2021 0108   GFRNONAA 9 (L) 06/16/2021 0108   GFRAA 38 (L) 08/09/2019 1104    COAGS: Lab Results  Component Value Date   INR 1.3 (H) 04/24/2021   INR 1.1 09/06/2020     Non-Invasive Vascular Imaging:   BUE vein mapping ordered and pending   ASSESSMENT/PLAN: This is a 81 y.o. female with CKD V that has now progressed to ESRD and now in need of permanent dialysis access.   She has TDC that was placed by IR 06/15/2021  -pt is right hand dominant.  Will restrict left arm.  Discussed with pt and family difference between fistula and graft and vein mapping would determine our next step.  I also discussed with them that access may fail to mature and may need further interventions or even new access in the future.      Leontine Locket, PA-C [facility] [phone]

## 2021-06-17 ENCOUNTER — Inpatient Hospital Stay (HOSPITAL_COMMUNITY): Payer: Medicare HMO

## 2021-06-17 DIAGNOSIS — N186 End stage renal disease: Secondary | ICD-10-CM

## 2021-06-17 DIAGNOSIS — Z992 Dependence on renal dialysis: Secondary | ICD-10-CM

## 2021-06-17 LAB — CBC
HCT: 27 % — ABNORMAL LOW (ref 36.0–46.0)
Hemoglobin: 8.7 g/dL — ABNORMAL LOW (ref 12.0–15.0)
MCH: 30 pg (ref 26.0–34.0)
MCHC: 32.2 g/dL (ref 30.0–36.0)
MCV: 93.1 fL (ref 80.0–100.0)
Platelets: 139 K/uL — ABNORMAL LOW (ref 150–400)
RBC: 2.9 MIL/uL — ABNORMAL LOW (ref 3.87–5.11)
RDW: 16 % — ABNORMAL HIGH (ref 11.5–15.5)
WBC: 5.4 K/uL (ref 4.0–10.5)
nRBC: 1.1 % — ABNORMAL HIGH (ref 0.0–0.2)

## 2021-06-17 LAB — HEPATITIS B SURFACE ANTIGEN: Hepatitis B Surface Ag: NONREACTIVE

## 2021-06-17 LAB — RENAL FUNCTION PANEL
Albumin: 3.1 g/dL — ABNORMAL LOW (ref 3.5–5.0)
Anion gap: 16 — ABNORMAL HIGH (ref 5–15)
BUN: 37 mg/dL — ABNORMAL HIGH (ref 8–23)
CO2: 20 mmol/L — ABNORMAL LOW (ref 22–32)
Calcium: 8.6 mg/dL — ABNORMAL LOW (ref 8.9–10.3)
Chloride: 103 mmol/L (ref 98–111)
Creatinine, Ser: 4.78 mg/dL — ABNORMAL HIGH (ref 0.44–1.00)
GFR, Estimated: 9 mL/min — ABNORMAL LOW
Glucose, Bld: 214 mg/dL — ABNORMAL HIGH (ref 70–99)
Phosphorus: 4.9 mg/dL — ABNORMAL HIGH (ref 2.5–4.6)
Potassium: 3.6 mmol/L (ref 3.5–5.1)
Sodium: 139 mmol/L (ref 135–145)

## 2021-06-17 LAB — GLUCOSE, CAPILLARY
Glucose-Capillary: 203 mg/dL — ABNORMAL HIGH (ref 70–99)
Glucose-Capillary: 123 mg/dL — ABNORMAL HIGH (ref 70–99)
Glucose-Capillary: 176 mg/dL — ABNORMAL HIGH (ref 70–99)
Glucose-Capillary: 217 mg/dL — ABNORMAL HIGH (ref 70–99)

## 2021-06-17 LAB — HEPATITIS B SURFACE ANTIBODY,QUALITATIVE: Hep B S Ab: NONREACTIVE

## 2021-06-17 MED ORDER — SODIUM CHLORIDE 0.9 % IV SOLN: 100.0000 mL | INTRAVENOUS | Status: DC | PRN

## 2021-06-17 MED ORDER — HEPARIN SODIUM (PORCINE) 1000 UNIT/ML IJ SOLN
INTRAMUSCULAR | Status: AC
  Filled 2021-06-17: qty 4

## 2021-06-17 MED ORDER — CHLORHEXIDINE GLUCONATE CLOTH 2 % EX PADS
6.0000 | MEDICATED_PAD | Freq: Every day | CUTANEOUS | Status: DC
  Administered 2021-06-17 – 2021-06-19 (×3): 6 via TOPICAL

## 2021-06-17 MED ORDER — LIDOCAINE-PRILOCAINE 2.5-2.5 % EX CREA
1.0000 "application " | TOPICAL_CREAM | CUTANEOUS | Status: DC | PRN
  Filled 2021-06-17: qty 5

## 2021-06-17 MED ORDER — HEPARIN SODIUM (PORCINE) 1000 UNIT/ML DIALYSIS: 20.0000 [IU]/kg | INTRAMUSCULAR | Status: DC | PRN

## 2021-06-17 MED ORDER — PENTAFLUOROPROP-TETRAFLUOROETH EX AERO: 1.0000 "application " | INHALATION_SPRAY | CUTANEOUS | Status: DC | PRN

## 2021-06-17 MED ORDER — SODIUM CHLORIDE 0.9% FLUSH
10.0000 mL | Freq: Two times a day (BID) | INTRAVENOUS | Status: DC
  Administered 2021-06-17 – 2021-06-19 (×4): 10 mL

## 2021-06-17 MED ORDER — ALTEPLASE 2 MG IJ SOLR: 2.0000 mg | Freq: Once | INTRAMUSCULAR | Status: DC | PRN

## 2021-06-17 MED ORDER — SODIUM CHLORIDE 0.9% FLUSH: 10.0000 mL | INTRAVENOUS | Status: DC | PRN

## 2021-06-17 MED ORDER — HEPARIN SODIUM (PORCINE) 1000 UNIT/ML DIALYSIS: 1000.0000 [IU] | INTRAMUSCULAR | Status: DC | PRN

## 2021-06-17 MED ORDER — CEFAZOLIN SODIUM-DEXTROSE 2-4 GM/100ML-% IV SOLN
2.0000 g | INTRAVENOUS | Status: AC
  Filled 2021-06-17: qty 100

## 2021-06-17 MED ORDER — LIDOCAINE HCL (PF) 1 % IJ SOLN
5.0000 mL | INTRAMUSCULAR | Status: DC | PRN
  Filled 2021-06-17: qty 5

## 2021-06-17 NOTE — Progress Notes (Signed)
[facility] Progress Note  Patient: [name]     ONG:[id number]  DOA: 06/12/2021      Date of Service: the patient was seen and examined on 06/17/2021  Brief hospital course:  [name] is a 81 y.o. female with medical history significant of CKD stage IV, hypertension, hyperlipidemia, diabetes type 2, paroxysmal A-fib not on anticoagulation who was sent from nephrology office to the ER for the evaluation of worsening kidney function, bilateral lower extremity swelling.  Lab work showed severe elevated creatinine in the range of 6.  Nephrology consulted.  Started on IV diuretics.  Flu test came to be positive. Placement of tunneled hemodialysis catheter on 2/13.  Started on dialysis on 2/13.  Renal navigator following for arranging outpatient dialysis.  Vascular surgery consulted for permanent access placement.   Subjective:  Feels anxious due to starting dialysis.   Assessment and Plan: Assessment and Plan: * Acute kidney injury superimposed on chronic kidney disease ([place])- (present on admission) History of CKD stage IV.  She was admitted and was discharged on 1/26 / 23 for the same.  Creatinine worsening over the past year.  Most recent baseline creatinine around 2. 5-3. Volume overloaded on presentation.  Creatinine in the range of 7. On her last admission, her AKI was thought to be secondary to NSAIDs use, ARB use, borderline hypotension.  She was being followed by [facility] as an outpatient and was sent for admission here after she was seen in the office. Started on diuretics with improvement in the volume status.  Nephrology following.  Placement of tunneled hemodialysis catheter on 2/13.  Started on dialysis on 2/13.  Renal navigator following for arranging outpatient dialysis.  Vascular surgery consulted for permanent access placement.  Pyuria UA showed cloudy urine with large leukocytes, many bacteria.   Empirically started on ceftriaxone but discontinued  because of finding of multiple species in the urine culture  Normocytic anemia This is most likely associated with CKD.  Hemoglobin is stable in the range of 8, near baseline. Patient also has a stable thrombocytopenia. Monitor CBC  Acute on chronic diastolic CHF (congestive heart failure) ([place]) Presented with volume overload.  Chest x-ray showed pulmonary vascular congestion, bilateral small pleural effusion.  Started on diuretics.  Continue to monitor input/output, daily weight. Echo done on 12/26/2020 had shown EF of 60 to 44%, grade 3 diastolic dysfunction, moderate mitral regurgitation, severe tricuspid regurgitation, moderate pulmonary regurgitation, moderate pulmonary hypertension. She had significant diuresis with significant improvement in the anasarca  DM (diabetes mellitus), type 2 with renal complications ([name])- (present on admission) Takes insulin at home.  Monitor blood sugars.  Continue sliding scale insulin for now.  Influenza A  Tamiflu given from 2/10-2/12  - noted to be wheezing on 2/14 and then started on Prednisone 40 mg daily by Dr Daria Pastures  Hyperlipidemia- (present on admission) Takes pravastatin which we will continue.  Essential hypertension- (present on admission) Currently normotensive.  Takes amlodipine 5 mg, Imdur 30 mg at home .  We will hold these medicines for now      DVT prophylaxis: heparin injection 5,000 Units Start: 06/12/21 1700   Code Status:   Code Status: Full Code  Level of Care: Level of care: Progressive Disposition Plan:  Status is: Inpatient Remains inpatient appropriate because: needs Dialysis access and outpt dialysis center          Objective:   Vitals:   06/17/21 1130 06/17/21 1232 06/17/21 1544 06/17/21 1600  BP: (!) 164/96 Marland Kitchen)  160/91 121/76 (!) 150/81  Pulse: 81 88 82 82  Resp: 14 20 16  (!) 24  Temp:  97.9 F (36.6 C) 97.6 F (36.4 C)   TempSrc:  Temporal Oral   SpO2:   99% 96%  Weight:  75.8 kg    Height:        Filed Weights   06/16/21 0500 06/17/21 0940 06/17/21 1232  Weight: 77.5 kg 76.7 kg 75.8 kg   Exam: General exam: Appears comfortable  HEENT: PERRLA, oral mucosa moist, no sclera icterus or thrush Respiratory system: Clear to auscultation. Respiratory effort normal. Cardiovascular system: S1 & S2 heard, regular rate and rhythm Gastrointestinal system: Abdomen soft, non-tender, nondistended. Normal bowel sounds   Central nervous system: Alert and oriented. No focal neurological deficits. Extremities: No cyanosis, clubbing or edema Skin: No rashes or ulcers Psychiatry:  Mood & affect appropriate.   Imaging and lab data Reviewed  CBC: Recent Labs  Lab 06/12/21 1141 06/13/21 0024 06/14/21 0146 06/15/21 1734 06/16/21 0108 06/17/21 0800  WBC 4.6 4.3 4.1 5.0 5.8 5.4  NEUTROABS 2.5  --  2.3  --   --   --   HGB 8.7* 7.9* 8.4* 8.6* 8.8* 8.7*  HCT 28.8* 25.6* 26.5* 26.7* 28.6* 27.0*  MCV 96.0 94.5 93.0 92.7 94.4 93.1  PLT 143* 135* 146* 150 125* 774*   Basic Metabolic Panel: Recent Labs  Lab 06/13/21 0024 06/14/21 0146 06/15/21 0551 06/16/21 0108 06/17/21 0800  NA 140 139 140 140 139  K 3.7 3.5 3.0* 3.5 3.6  CL 107 106 105 103 103  CO2 20* 20* 22 23 20*  GLUCOSE 147* 136* 79 123* 214*  BUN 52* 51* 50* 34* 37*  CREATININE 6.51* 6.37* 6.15* 4.80* 4.78*  CALCIUM 8.4* 8.6* 8.4* 8.8* 8.6*  PHOS 6.3*  --   --  4.2 4.9*   GFR: Estimated Creatinine Clearance: 9.5 mL/min (A) (by C-G formula based on SCr of 4.78 mg/dL (H)).    Author: Debbe Odea  06/17/2021 6:31 PM

## 2021-06-17 NOTE — Progress Notes (Signed)
[facility] NEPHROLOGY PROGRESS NOTE  Assessment/ Plan: Pt is a 81 y.o. yo female with history of HTN, type II DM, A-fib, CKD stage V followed by Dr. Joelyn Oms for shortness of breath and fluid overload.  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 Status post right IJ TDC placed by IR on 2/13 and received first HD. Renal navigator is following to arrange outpatient HD.  Vein mapping ordered, VVS planning for perm access placement tomorrow.  Plan for next HD on Friday.   #Fluid overload: Received IV diuretics without much response and now managing with dialysis.  #Influenza A: Continue albuterol and supportive care per primary team.  # Anemia of CKD: Iron saturation is 11% therefore getting IV iron.  Continue Aranesp.  Monitor hemoglobin.   # Secondary hyperparathyroidism: PTH 202 at goal.  Hyperphosphatemia is managed with Renvela and HD.  # HTN/volume: Blood pressure acceptable, volume managed with dialysis.  Discontinued IV diuretics.  #Hypokalemia due to diuretics:  Managing with HD.  Subjective: Seen and examined. At HD unit, tolerating well. Feels hungry. No CP, SOB. D/w HD nurse.  Objective Vital signs in last 24 hours: Vitals:   06/17/21 0944 06/17/21 1030 06/17/21 1100 06/17/21 1130  BP: (!) 135/96 126/78 (!) 164/98 (!) 164/96  Pulse:    81  Resp: 13 15 14 14   Temp: (!) 97.3 F (36.3 C)     TempSrc:      SpO2:      Weight:      Height:       Weight change:   Intake/Output Summary (Last 24 hours) at 06/17/2021 1201 Last data filed at 06/17/2021 0600 Gross per 24 hour  Intake 274.31 ml  Output 800 ml  Net -525.69 ml        Labs: Basic Metabolic Panel: Recent Labs  Lab 06/13/21 0024 06/14/21 0146 06/15/21 0551 06/16/21 0108 06/17/21 0800  NA 140   < > 140 140 139  K 3.7   < > 3.0* 3.5 3.6  CL 107   < > 105 103 103  CO2 20*   < > 22 23 20*  GLUCOSE 147*   < > 79 123* 214*   BUN 52*   < > 50* 34* 37*  CREATININE 6.51*   < > 6.15* 4.80* 4.78*  CALCIUM 8.4*   < > 8.4* 8.8* 8.6*  PHOS 6.3*  --   --  4.2 4.9*   < > = values in this interval not displayed.    Liver Function Tests: Recent Labs  Lab 06/12/21 1141 06/16/21 0108 06/17/21 0800  AST 23  --   --   ALT 12  --   --   ALKPHOS 62  --   --   BILITOT 0.7  --   --   PROT 6.8  --   --   ALBUMIN 3.3* 3.2* 3.1*    No results for input(s): LIPASE, AMYLASE in the last 168 hours. No results for input(s): AMMONIA in the last 168 hours. CBC: Recent Labs  Lab 06/12/21 1141 06/13/21 0024 06/14/21 0146 06/15/21 1734 06/16/21 0108 06/17/21 0800  WBC 4.6 4.3 4.1 5.0 5.8 5.4  NEUTROABS 2.5  --  2.3  --   --   --   HGB 8.7* 7.9* 8.4* 8.6* 8.8* 8.7*  HCT 28.8* 25.6* 26.5* 26.7* 28.6* 27.0*  MCV 96.0 94.5 93.0 92.7 94.4 93.1  PLT 143* 135* 146* 150 125* 139*  Cardiac Enzymes: No results for input(s): CKTOTAL, CKMB, CKMBINDEX, TROPONINI in the last 168 hours. CBG: Recent Labs  Lab 06/15/21 2106 06/16/21 0756 06/16/21 1144 06/16/21 1612 06/17/21 0741  GLUCAP 115* 105* 118* 204* 203*     Iron Studies:  Recent Labs    06/16/21 0108  IRON 32  TIBC 283  FERRITIN 142    Studies/Results: No results found.  Medications: Infusions:  sodium chloride     sodium chloride     [START ON 06/18/2021]  ceFAZolin (ANCEF) IV     ferric gluconate (FERRLECIT) IVPB 250 mg (06/17/21 1100)     Scheduled Medications:  Chlorhexidine Gluconate Cloth  6 each Topical Q0600   [START ON 06/19/2021] darbepoetin (ARANESP) injection - DIALYSIS  60 mcg Intravenous Q Fri-HD   heparin  5,000 Units Subcutaneous Q8H   insulin aspart  0-9 Units Subcutaneous TID WC   pravastatin  20 mg Oral Daily   predniSONE  40 mg Oral Q breakfast   sevelamer carbonate  800 mg Oral TID WC   sodium chloride flush  10-40 mL Intracatheter Q12H    have reviewed scheduled and prn medications.  Physical Exam: General:NAD,  comfortable Heart:RRR, s1s2 nl Lungs: clear b/l, no crackle Abdomen:soft, Non-tender, non-distended Extremities:Some LE edema+ Dialysis Access: Right IJ TDC in place.  Dartanion Teo Tanna Furry 06/17/2021,12:01 PM  LOS: 5 days

## 2021-06-17 NOTE — Plan of Care (Signed)
  Problem: Clinical Measurements: Goal: Ability to maintain clinical measurements within normal limits will improve Outcome: Progressing   

## 2021-06-17 NOTE — Plan of Care (Signed)

## 2021-06-17 NOTE — Progress Notes (Signed)
Bilateral upper extremity vein mapping completed. Refer to "CV Proc" under chart review to view preliminary results.  06/17/2021 2:35 PM Kelby Aline., MHA, RVT, RDCS, RDMS

## 2021-06-17 NOTE — Progress Notes (Addendum)
Contacted [facility] admissions this am regarding pt's acceptance at [place]. Awaiting response and final approval. Will assist with HD needs.   Melven Sartorius Renal Navigator [phone]  Addendum at 4:42 pm: Pt has ben accepted at [facility] on TTS with 11:00 chair time. Update provided to nephrologist this afternoon. Will f/u with pt tomorrow regarding arrangements. Will need to clarify with clinic if pt can start on Saturday and then update medical team.

## 2021-06-17 NOTE — Progress Notes (Signed)
Second attempt at UE vein mapping, pt is currently in HD. Will attempt again as schedule permits.  06/17/2021 10:39 AM Kelby Aline., MHA, RVT, RDCS, RDMS  p

## 2021-06-17 NOTE — Progress Notes (Addendum)
° °  VASCULAR SURGERY ASSESSMENT & PLAN:   END-STAGE RENAL DISEASE: She is scheduled for a left AV fistula or AV graft tomorrow pending the results of her vein map.  They have remove the IV from her left hand.  I have written preop orders.  All of her questions were answered and she is agreeable to proceed with surgery.  She is for dialysis today.  Please do not dialyze the patient tomorrow as she is scheduled for surgery.  ADDENDUM: Based on the vein map it looks like she will likely require placement of an AV graft in the left arm.  SUBJECTIVE:   No complaints.  PHYSICAL EXAM:   Vitals:   06/16/21 1200 06/16/21 1408 06/16/21 1609 06/16/21 2010  BP: 119/77  129/86   Pulse: 81  95   Resp: 18  17   Temp: 99.2 F (37.3 C)  (!) 97.3 F (36.3 C) 97.9 F (36.6 C)  TempSrc: Oral  Oral Oral  SpO2:  95%  97%  Weight:      Height:       Palpable left radial pulse.  LABS:   Lab Results  Component Value Date   WBC 5.8 06/16/2021   HGB 8.8 (L) 06/16/2021   HCT 28.6 (L) 06/16/2021   MCV 94.4 06/16/2021   PLT 125 (L) 06/16/2021   Lab Results  Component Value Date   CREATININE 4.80 (H) 06/16/2021   Lab Results  Component Value Date   INR 1.3 (H) 04/24/2021   CBG (last 3)  Recent Labs    06/16/21 0756 06/16/21 1144 06/16/21 1612  GLUCAP 105* 118* 204*    PROBLEM LIST:    Principal Problem:   Acute kidney injury superimposed on chronic kidney disease ([place]) Active Problems:   Essential hypertension   Hyperlipidemia   Influenza A   DM (diabetes mellitus), type 2 with renal complications ([facility])   Acute on chronic diastolic CHF (congestive heart failure) ([facility])   Normocytic anemia   AKI (acute kidney injury) ([place])   UTI (urinary tract infection)   Debility   CURRENT MEDS:    Chlorhexidine Gluconate Cloth  6 each Topical Q0600   [START ON 06/19/2021] darbepoetin (ARANESP) injection - DIALYSIS  60 mcg Intravenous Q Fri-HD   heparin  5,000 Units Subcutaneous Q8H    insulin aspart  0-9 Units Subcutaneous TID WC   pravastatin  20 mg Oral Daily   predniSONE  40 mg Oral Q breakfast   sevelamer carbonate  800 mg Oral TID WC    Deitra Mayo Office: [phone] 06/17/2021

## 2021-06-17 NOTE — Progress Notes (Signed)
Inpatient Diabetes Program Recommendations  AACE/ADA: New Consensus Statement on Inpatient Glycemic Control (2015)  Target Ranges:  Prepandial:   less than 140 mg/dL      Peak postprandial:   less than 180 mg/dL (1-2 hours)      Critically ill patients:  140 - 180 mg/dL   Lab Results  Component Value Date   GLUCAP 123 (H) 06/17/2021   HGBA1C 9.6 (H) 04/25/2021    Review of Glycemic Control  Diabetes history: DM2 Outpatient Diabetes medications: Humulin 70/30 5 units QAM Current orders for Inpatient glycemic control: Novolog 0-9 units TID with meals  HgbA1C - 9.6% Will be NPO after MN for surgery  Inpatient Diabetes Program Recommendations:    Consider Novolog 0-6 Q4H when NPO.  Will follow glucose trends.  Thank you. Lorenda Peck, RD, LDN, CDE Inpatient Diabetes Coordinator [phone]

## 2021-06-17 NOTE — Evaluation (Signed)
Occupational Therapy Evaluation Patient Details Name: [name] MRN: [id number] DOB: 05/29/1940 Today's Date: 06/17/2021   History of Present Illness Pt is 81 yo female admitted on 06/12/21 for worsening kidney function and bil LE edema; also found to be + for the flu.  Pt had tunneled hemodialysis catheter placed on 2/13 and started dialysis.  Has vascular consult for permanent access placement.   Clinical Impression   Pt presents with decline in function and safety with ADLs and ADL mobility with impaired strength, balance and endurance; pt with confusion/impaired STM. PTA pt lived at home with her daughter and was Ind with ADLs/selfcare and used a Marketing executive. Pt currently requires min A to sit EOB, mod A with LB ADLs, min guard A with UB ADLs, mod A with toileting and min A with transfers/mobility/using RW. Pt would benefit from acute OT services to address impairments to maximize level of function and safety     Recommendations for follow up therapy are one component of a multi-disciplinary discharge planning process, led by the attending physician.  Recommendations may be updated based on patient status, additional functional criteria and insurance authorization.   Follow Up Recommendations  Home health OT    Assistance Recommended at Discharge Frequent or constant Supervision/Assistance  Patient can return home with the following A little help with walking and/or transfers;A little help with bathing/dressing/bathroom;Assistance with cooking/housework;Assistance with feeding;Direct supervision/assist for medications management;Direct supervision/assist for financial management;Assist for transportation    Functional Status Assessment  Patient has had a recent decline in their functional status and demonstrates the ability to make significant improvements in function in a reasonable and predictable amount of time.  Equipment Recommendations  None recommended by OT    Recommendations  for Other Services       Precautions / Restrictions Precautions Precautions: Fall Precaution Comments: loose BM Restrictions Weight Bearing Restrictions: No      Mobility Bed Mobility Overal bed mobility: Needs Assistance Bed Mobility: Supine to Sit, Sit to Supine     Supine to sit: Min assist, HOB elevated Sit to supine: Min assist, HOB elevated   General bed mobility comments: Increased time and cues with use of bed rail    Transfers Overall transfer level: Needs assistance Equipment used: Rolling walker (2 wheels) Transfers: Sit to/from Stand, Bed to chair/wheelchair/BSC Sit to Stand: Min assist     Step pivot transfers: Min assist            Balance Overall balance assessment: Needs assistance Sitting-balance support: No upper extremity supported, Feet supported Sitting balance-Leahy Scale: Good     Standing balance support: Bilateral upper extremity supported, Reliant on assistive device for balance Standing balance-Leahy Scale: Poor                             ADL either performed or assessed with clinical judgement   ADL Overall ADL's : Needs assistance/impaired Eating/Feeding: Independent;Sitting   Grooming: Wash/dry hands;Wash/dry face;Min guard;Sitting   Upper Body Bathing: Min guard;Sitting   Lower Body Bathing: Moderate assistance;Sit to/from stand   Upper Body Dressing : Min guard;Sitting   Lower Body Dressing: Moderate assistance;Sit to/from stand   Toilet Transfer: Minimal assistance;Cueing for safety;Cueing for sequencing;Ambulation;Rolling walker (2 wheels);BSC/3in1   Toileting- Clothing Manipulation and Hygiene: Sit to/from stand;Moderate assistance;Minimal assistance Toileting - Clothing Manipulation Details (indicate cue type and reason): mod A with posterior hygiene     Functional mobility during ADLs: Minimal assistance;Rolling walker (2 wheels);Cueing for  safety;Cueing for sequencing       Vision Baseline  Vision/History: 1 Wears glasses Ability to See in Adequate Light: 0 Adequate Patient Visual Report: No change from baseline       Perception     Praxis      Pertinent Vitals/Pain Pain Assessment Pain Assessment: No/denies pain     Hand Dominance Right   Extremity/Trunk Assessment Upper Extremity Assessment Upper Extremity Assessment: Generalized weakness   Lower Extremity Assessment Lower Extremity Assessment: Defer to PT evaluation   Cervical / Trunk Assessment Cervical / Trunk Assessment: Normal   Communication Communication Communication: No difficulties   Cognition Arousal/Alertness: Awake/alert Behavior During Therapy: WFL for tasks assessed/performed Overall Cognitive Status: No family/caregiver present to determine baseline cognitive functioning Area of Impairment: Memory, Following commands, Safety/judgement                     Memory: Decreased short-term memory Following Commands: Follows multi-step commands with increased time Safety/Judgement: Decreased awareness of deficits, Decreased awareness of safety     General Comments: pt asking repeat questions, did not recall that she had been to HD this morning. Pt alos confused NT for kitchen staff bringing her lunch     General Comments       Exercises     Shoulder Instructions      Home Living Family/patient expects to be discharged to:: Private residence Living Arrangements: Children Available Help at Discharge: Family;Available 24 hours/day Type of Home: House Home Access: Stairs to enter [employer] of Steps: 3 Entrance Stairs-Rails: Can reach both Home Layout: Able to live on main level with bedroom/bathroom;Two level     Bathroom Shower/Tub: Occupational psychologist: Standard     Home Equipment: Rollator (4 wheels);Shower seat;Hospital bed   Additional Comments: Reports needs BSC.  PT discussed possible w/c and they agree.  Also, reports pt tends to slide out  of her hospital bed - asking about other options - different bed, rails, etc -will mention to case manager      Prior Functioning/Environment Prior Level of Function : Needs assist             Mobility Comments: Pt reports could ambulate in house and short community distances.  Occasionally, used rollator.  Had help on stairs ADLs Comments: Independent with ADLs; needed assist with IADLs        OT Problem List: Decreased strength;Decreased range of motion;Impaired balance (sitting and/or standing);Decreased cognition;Decreased safety awareness;Decreased activity tolerance;Decreased knowledge of use of DME or AE      OT Treatment/Interventions: Self-care/ADL training;DME and/or AE instruction;Patient/family education;Balance training;Therapeutic activities    OT Goals(Current goals can be found in the care plan section) Acute Rehab OT Goals Patient Stated Goal: go home OT Goal Formulation: With patient Time For Goal Achievement: 07/01/21 Potential to Achieve Goals: Good ADL Goals Pt Will Perform Grooming: with min guard assist;with supervision;with set-up;standing;with caregiver independent in assisting Pt Will Perform Upper Body Bathing: with supervision;with set-up;with modified independence;sitting;with caregiver independent in assisting Pt Will Perform Lower Body Bathing: with min assist;with min guard assist;with caregiver independent in assisting Pt Will Perform Upper Body Dressing: with supervision;with set-up;with modified independence;sitting;with caregiver independent in assisting Pt Will Perform Lower Body Dressing: with min assist;with min guard assist;with caregiver independent in assisting Pt Will Transfer to Toilet: with supervision;ambulating Pt Will Perform Toileting - Clothing Manipulation and hygiene: with min guard assist;with supervision;sit to/from stand;with caregiver independent in assisting Pt Will Perform Tub/Shower Transfer: with min  assist;with min guard  assist;ambulating;rolling walker;shower seat;grab bars  OT Frequency: Min 2X/week    Co-evaluation              AM-PAC OT "6 Clicks" Daily Activity     Outcome Measure Help from another person eating meals?: None Help from another person taking care of personal grooming?: A Little Help from another person toileting, which includes using toliet, bedpan, or urinal?: A Lot Help from another person bathing (including washing, rinsing, drying)?: A Lot Help from another person to put on and taking off regular upper body clothing?: A Little Help from another person to put on and taking off regular lower body clothing?: A Lot 6 Click Score: 16   End of Session Equipment Utilized During Treatment: Rolling walker (2 wheels);Gait belt Nurse Communication: Mobility status  Activity Tolerance: Patient tolerated treatment well Patient left: with call bell/phone within reach;in bed;with bed alarm set  OT Visit Diagnosis: Unsteadiness on feet (R26.81);Muscle weakness (generalized) (M62.81)                Time: 1747-1595 OT Time Calculation (min): 25 min Charges:  OT General Charges $OT Visit: 1 Visit OT Evaluation $OT Eval Moderate Complexity: 1 Mod OT Treatments $Self Care/Home Management : 8-22 mins    Joelie, Schou 06/17/2021, 2:51 PM

## 2021-06-17 NOTE — Procedures (Signed)
Patient was seen on dialysis and the procedure was supervised.  BFR 300  Via TDC BP is  164/96.   Patient appears to be tolerating treatment well. Seen by VVS and likely to OR for perm access tomorrow.   [name] 06/17/2021

## 2021-06-17 NOTE — Plan of Care (Signed)
°  Problem: Health Behavior/Discharge Planning: Goal: Ability to manage health-related needs will improve 06/17/2021 1634 by Drucie Ip I, RN Outcome: Progressing 06/17/2021 1629 by Drucie Ip I, RN Outcome: Progressing   Problem: Clinical Measurements: Goal: Ability to maintain clinical measurements within normal limits will improve 06/17/2021 1634 by Drucie Ip I, RN Outcome: Progressing 06/17/2021 1629 by Drucie Ip I, RN Outcome: Progressing Goal: Will remain free from infection 06/17/2021 1634 by Drucie Ip I, RN Outcome: Progressing 06/17/2021 1629 by Drucie Ip I, RN Outcome: Progressing Goal: Diagnostic test results will improve 06/17/2021 1634 by Drucie Ip I, RN Outcome: Progressing 06/17/2021 1629 by Drucie Ip I, RN Outcome: Progressing Goal: Respiratory complications will improve 06/17/2021 1634 by Drucie Ip I, RN Outcome: Progressing 06/17/2021 1629 by Drucie Ip I, RN Outcome: Progressing Goal: Cardiovascular complication will be avoided 06/17/2021 1634 by Drucie Ip I, RN Outcome: Progressing 06/17/2021 1629 by Drucie Ip I, RN Outcome: Progressing   Problem: Activity: Goal: Risk for activity intolerance will decrease 06/17/2021 1634 by Drucie Ip I, RN Outcome: Progressing 06/17/2021 1629 by Drucie Ip I, RN Outcome: Progressing   Problem: Nutrition: Goal: Adequate nutrition will be maintained 06/17/2021 1634 by Drucie Ip I, RN Outcome: Progressing 06/17/2021 1629 by Drucie Ip I, RN Outcome: Progressing   Problem: Coping: Goal: Level of anxiety will decrease 06/17/2021 1634 by Drucie Ip I, RN Outcome: Progressing 06/17/2021 1629 by Drucie Ip I, RN Outcome: Progressing   Problem: Elimination: Goal: Will not experience complications related to bowel motility 06/17/2021 1634 by Drucie Ip I, RN Outcome: Progressing 06/17/2021  1629 by Drucie Ip I, RN Outcome: Progressing Goal: Will not experience complications related to urinary retention 06/17/2021 1634 by Drucie Ip I, RN Outcome: Progressing 06/17/2021 1629 by Drucie Ip I, RN Outcome: Progressing   Problem: Pain Managment: Goal: General experience of comfort will improve 06/17/2021 1634 by Drucie Ip I, RN Outcome: Progressing 06/17/2021 1629 by Drucie Ip I, RN Outcome: Progressing   Problem: Safety: Goal: Ability to remain free from injury will improve 06/17/2021 1634 by Drucie Ip I, RN Outcome: Progressing 06/17/2021 1629 by Drucie Ip I, RN Outcome: Progressing   Problem: Skin Integrity: Goal: Risk for impaired skin integrity will decrease 06/17/2021 1634 by Drucie Ip I, RN Outcome: Progressing 06/17/2021 1629 by Drucie Ip I, RN Outcome: Progressing

## 2021-06-18 ENCOUNTER — Inpatient Hospital Stay (HOSPITAL_COMMUNITY): Payer: Medicare HMO | Admitting: Certified Registered Nurse Anesthetist

## 2021-06-18 ENCOUNTER — Encounter (HOSPITAL_COMMUNITY): Payer: Self-pay | Admitting: Internal Medicine

## 2021-06-18 ENCOUNTER — Other Ambulatory Visit: Payer: Self-pay

## 2021-06-18 ENCOUNTER — Encounter (HOSPITAL_COMMUNITY)
Admission: EM | Disposition: A | Discharge status: Home Health | Payer: Self-pay | Source: Home / Self Care | Attending: Internal Medicine

## 2021-06-18 DIAGNOSIS — D696 Thrombocytopenia, unspecified: Secondary | ICD-10-CM

## 2021-06-18 DIAGNOSIS — N186 End stage renal disease: Secondary | ICD-10-CM

## 2021-06-18 DIAGNOSIS — E1122 Type 2 diabetes mellitus with diabetic chronic kidney disease: Secondary | ICD-10-CM

## 2021-06-18 DIAGNOSIS — I509 Heart failure, unspecified: Secondary | ICD-10-CM

## 2021-06-18 DIAGNOSIS — I132 Hypertensive heart and chronic kidney disease with heart failure and with stage 5 chronic kidney disease, or end stage renal disease: Secondary | ICD-10-CM

## 2021-06-18 HISTORY — PX: AV FISTULA PLACEMENT: SHX1204

## 2021-06-18 LAB — POCT I-STAT, CHEM 8
BUN: 24 mg/dL — ABNORMAL HIGH (ref 8–23)
Calcium, Ion: 1.06 mmol/L — ABNORMAL LOW (ref 1.15–1.40)
Chloride: 102 mmol/L (ref 98–111)
Creatinine, Ser: 3.5 mg/dL — ABNORMAL HIGH (ref 0.44–1.00)
Glucose, Bld: 155 mg/dL — ABNORMAL HIGH (ref 70–99)
HCT: 30 % — ABNORMAL LOW (ref 36.0–46.0)
Hemoglobin: 10.2 g/dL — ABNORMAL LOW (ref 12.0–15.0)
Potassium: 3.3 mmol/L — ABNORMAL LOW (ref 3.5–5.1)
Sodium: 139 mmol/L (ref 135–145)
TCO2: 26 mmol/L (ref 22–32)

## 2021-06-18 LAB — GLUCOSE, CAPILLARY
Glucose-Capillary: 149 mg/dL — ABNORMAL HIGH (ref 70–99)
Glucose-Capillary: 140 mg/dL — ABNORMAL HIGH (ref 70–99)

## 2021-06-18 LAB — HEPATITIS B SURFACE ANTIBODY, QUANTITATIVE: Hep B S AB Quant (Post): <3.1 m[IU]/mL — ABNORMAL LOW (ref >9.9)

## 2021-06-18 SURGERY — ARTERIOVENOUS (AV) FISTULA CREATION
Anesthesia: Monitor Anesthesia Care | Site: Arm Upper | Laterality: Left

## 2021-06-18 MED ORDER — HYDROCODONE-ACETAMINOPHEN 5-325 MG PO TABS: 1.0000 | ORAL_TABLET | Freq: Four times a day (QID) | ORAL | Status: DC | PRN

## 2021-06-18 MED ORDER — ORAL CARE MOUTH RINSE: 15.0000 mL | Freq: Once | OROMUCOSAL | Status: AC

## 2021-06-18 MED ORDER — CEFAZOLIN SODIUM-DEXTROSE 2-4 GM/100ML-% IV SOLN
INTRAVENOUS | Status: AC
  Filled 2021-06-18: qty 100

## 2021-06-18 MED ORDER — HEPARIN 6000 UNIT IRRIGATION SOLUTION
Status: AC
  Filled 2021-06-18: qty 500

## 2021-06-18 MED ORDER — PROPOFOL 10 MG/ML IV BOLUS
INTRAVENOUS | Status: AC
  Filled 2021-06-18: qty 20

## 2021-06-18 MED ORDER — CHLORHEXIDINE GLUCONATE CLOTH 2 % EX PADS
6.0000 | MEDICATED_PAD | Freq: Every day | CUTANEOUS | Status: DC
  Administered 2021-06-19: 6 via TOPICAL

## 2021-06-18 MED ORDER — CHLORHEXIDINE GLUCONATE 0.12 % MT SOLN
15.0000 mL | Freq: Once | OROMUCOSAL | Status: AC
  Administered 2021-06-18: 15 mL via OROMUCOSAL
  Filled 2021-06-18: qty 15

## 2021-06-18 MED ORDER — MIDAZOLAM HCL 2 MG/2ML IJ SOLN
INTRAMUSCULAR | Status: AC
  Filled 2021-06-18: qty 2

## 2021-06-18 MED ORDER — SODIUM CHLORIDE 0.9 % IV SOLN: INTRAVENOUS | Status: DC

## 2021-06-18 MED ORDER — LIDOCAINE-EPINEPHRINE (PF) 1 %-1:200000 IJ SOLN
INTRAMUSCULAR | Status: AC
  Filled 2021-06-18: qty 30

## 2021-06-18 MED ORDER — HEPARIN 6000 UNIT IRRIGATION SOLUTION
Status: DC | PRN
  Administered 2021-06-18: 1

## 2021-06-18 MED ORDER — 0.9 % SODIUM CHLORIDE (POUR BTL) OPTIME
TOPICAL | Status: DC | PRN
  Administered 2021-06-18: 1000 mL

## 2021-06-18 MED ORDER — FENTANYL CITRATE PF 50 MCG/ML IJ SOSY
50.0000 ug | PREFILLED_SYRINGE | Freq: Once | INTRAMUSCULAR | Status: DC
  Filled 2021-06-18: qty 1

## 2021-06-18 MED ORDER — ROPIVACAINE HCL 5 MG/ML IJ SOLN
INTRAMUSCULAR | Status: DC | PRN
  Administered 2021-06-18: 25 mL via PERINEURAL

## 2021-06-18 MED ORDER — FENTANYL CITRATE (PF) 100 MCG/2ML IJ SOLN
INTRAMUSCULAR | Status: AC
  Administered 2021-06-18: 100 ug
  Filled 2021-06-18: qty 2

## 2021-06-18 MED ORDER — FENTANYL CITRATE (PF) 100 MCG/2ML IJ SOLN: 25.0000 ug | INTRAMUSCULAR | Status: DC | PRN

## 2021-06-18 MED ORDER — FENTANYL CITRATE (PF) 250 MCG/5ML IJ SOLN
INTRAMUSCULAR | Status: AC
  Filled 2021-06-18: qty 5

## 2021-06-18 SURGICAL SUPPLY — 28 items
ARMBAND PINK RESTRICT EXTREMIT (MISCELLANEOUS) ×2 IMPLANT
BAG COUNTER SPONGE SURGICOUNT (BAG) ×2 IMPLANT
CANISTER SUCT 3000ML PPV (MISCELLANEOUS) ×2 IMPLANT
CLIP LIGATING EXTRA MED SLVR (CLIP) ×2 IMPLANT
CLIP LIGATING EXTRA SM BLUE (MISCELLANEOUS) ×2 IMPLANT
COVER PROBE W GEL 5X96 (DRAPES) ×1 IMPLANT
DERMABOND ADVANCED (GAUZE/BANDAGES/DRESSINGS) ×1
DERMABOND ADVANCED .7 DNX12 (GAUZE/BANDAGES/DRESSINGS) ×1 IMPLANT
ELECT REM PT RETURN 9FT ADLT (ELECTROSURGICAL) ×2
ELECTRODE REM PT RTRN 9FT ADLT (ELECTROSURGICAL) ×1 IMPLANT
GLOVE SURG ENC MOIS LTX SZ7.5 (GLOVE) ×2 IMPLANT
GOWN STRL REUS W/ TWL LRG LVL3 (GOWN DISPOSABLE) ×2 IMPLANT
GOWN STRL REUS W/ TWL XL LVL3 (GOWN DISPOSABLE) ×1 IMPLANT
GOWN STRL REUS W/TWL LRG LVL3 (GOWN DISPOSABLE) ×2
GOWN STRL REUS W/TWL XL LVL3 (GOWN DISPOSABLE) ×1
INSERT FOGARTY SM (MISCELLANEOUS) IMPLANT
KIT BASIN OR (CUSTOM PROCEDURE TRAY) ×2 IMPLANT
KIT TURNOVER KIT B (KITS) ×2 IMPLANT
NS IRRIG 1000ML POUR BTL (IV SOLUTION) ×2 IMPLANT
PACK CV ACCESS (CUSTOM PROCEDURE TRAY) ×2 IMPLANT
PAD ARMBOARD 7.5X6 YLW CONV (MISCELLANEOUS) ×4 IMPLANT
SUT MNCRL AB 4-0 PS2 18 (SUTURE) ×2 IMPLANT
SUT PROLENE 6 0 BV (SUTURE) ×4 IMPLANT
SUT VIC AB 3-0 SH 27 (SUTURE) ×1
SUT VIC AB 3-0 SH 27X BRD (SUTURE) ×1 IMPLANT
TOWEL GREEN STERILE (TOWEL DISPOSABLE) ×2 IMPLANT
UNDERPAD 30X36 HEAVY ABSORB (UNDERPADS AND DIAPERS) ×2 IMPLANT
WATER STERILE IRR 1000ML POUR (IV SOLUTION) ×2 IMPLANT

## 2021-06-18 NOTE — Progress Notes (Signed)
[facility] Progress Note  Patient: [name]     ZOX:[id number]  DOA: 06/12/2021      Date of Service: the patient was seen and examined on 06/18/2021  Brief hospital course:  Ailie Gage is a 81 y.o. female with medical history significant of CKD stage IV, hypertension, hyperlipidemia, diabetes type 2, paroxysmal A-fib not on anticoagulation who was sent from nephrology office to the ER for the evaluation of worsening kidney function & bilateral lower extremity swelling.  Lab work showed severe elevated creatinine in the range of 6.  Nephrology consulted.  Started on IV diuretics.  Noted to be influenza positive.      Subjective:  She has no complaints today.   Assessment and Plan: Assessment and Plan: * Acute kidney injury superimposed on chronic kidney disease ([place])- (present on admission) History of CKD stage IV.   She was admitted and was discharged on 1/26 /23 for the same.    On her last admission, her AKI was thought to be secondary to NSAIDs use, ARB use, borderline hypotension.  She was being followed by [facility] as an outpatient and was sent for admission here after she was seen in the office. - 2/13 tunneled cath placed and started on dialysis on 2/13.    - Left arm brachial artery to basilic vein av fistula created today  Thrombocytopenia ([place]), chronic stable  Pyuria UA showed cloudy urine with large leukocytes, many bacteria.   Empirically started on ceftriaxone but discontinued because of finding of multiple species in the urine culture  Normocytic anemia This is most likely associated with CKD.        Acute on chronic diastolic CHF (congestive heart failure) (HCC) -Echo done on 12/26/2020> EF of 60 to 81%, grade 3 diastolic dysfunction, moderate mitral regurgitation, severe tricuspid regurgitation, moderate pulmonary regurgitation, moderate pulmonary hypertension.    DM (diabetes mellitus), type 2 with renal complications ([place])- (present on  admission) - Continue sliding scale insulin for now.  Influenza A  Tamiflu given from 2/10-2/12  - noted to be wheezing on 2/14 and then started on Prednisone 40 mg daily x 3 days  Hyperlipidemia- (present on admission) - cont pravastatin    Essential hypertension- (present on admission)  - amlodipine 5 mg, Imdur 30 mg on hold- BP stable      DVT prophylaxis: heparin injection 5,000 Units Start: 06/12/21 1700   Code Status:   Code Status: Full Code  Level of Care: Level of care: Progressive Disposition Plan:  Status is: Inpatient Remains inpatient appropriate because: needs outpt dialysis center and transportation          Objective:   Vitals:   06/18/21 1045 06/18/21 1211 06/18/21 1227 06/18/21 1245  BP: (!) 145/70 (!) 143/77 (!) 149/77 (!) 141/80  Pulse: 80  79 79  Resp: 10 10 14 16   Temp:  97.8 F (36.6 C)  98 F (36.7 C)  TempSrc:      SpO2: 100% 100% 99% 96%  Weight:      Height:       Filed Weights   06/18/21 0422 06/18/21 1009 06/18/21 1029  Weight: 77.4 kg 77.4 kg 77.4 kg   Exam: General exam: Appears comfortable  HEENT: PERRLA, oral mucosa moist, no sclera icterus or thrush Respiratory system: Clear to auscultation. Respiratory effort normal. Cardiovascular system: S1 & S2 heard, regular rate and rhythm Gastrointestinal system: Abdomen soft, non-tender, nondistended. Normal bowel sounds   Central nervous system: Alert and oriented. No focal neurological  deficits. Extremities: No cyanosis, clubbing or edema Skin: No rashes or ulcers Psychiatry:  Mood & affect appropriate.    Imaging and lab data Reviewed  CBC: Recent Labs  Lab 06/12/21 1141 06/13/21 0024 06/14/21 0146 06/15/21 1734 06/16/21 0108 06/17/21 0800 06/18/21 1029  WBC 4.6 4.3 4.1 5.0 5.8 5.4  --   NEUTROABS 2.5  --  2.3  --   --   --   --   HGB 8.7* 7.9* 8.4* 8.6* 8.8* 8.7* 10.2*  HCT 28.8* 25.6* 26.5* 26.7* 28.6* 27.0* 30.0*  MCV 96.0 94.5 93.0 92.7 94.4 93.1  --   PLT  143* 135* 146* 150 125* 139*  --    Basic Metabolic Panel: Recent Labs  Lab 06/13/21 0024 06/14/21 0146 06/15/21 0551 06/16/21 0108 06/17/21 0800 06/18/21 1029  NA 140 139 140 140 139 139  K 3.7 3.5 3.0* 3.5 3.6 3.3*  CL 107 106 105 103 103 102  CO2 20* 20* 22 23 20*  --   GLUCOSE 147* 136* 79 123* 214* 155*  BUN 52* 51* 50* 34* 37* 24*  CREATININE 6.51* 6.37* 6.15* 4.80* 4.78* 3.50*  CALCIUM 8.4* 8.6* 8.4* 8.8* 8.6*  --   PHOS 6.3*  --   --  4.2 4.9*  --    GFR: Estimated Creatinine Clearance: 13.1 mL/min (A) (by C-G formula based on SCr of 3.5 mg/dL (H)).    Author: Debbe Odea  06/18/2021 2:23 PM

## 2021-06-18 NOTE — Anesthesia Procedure Notes (Signed)
Anesthesia Regional Block: Supraclavicular block   Pre-Anesthetic Checklist: , timeout performed,  Correct Patient, Correct Site, Correct Laterality,  Correct Procedure, Correct Position, site marked,  Risks and benefits discussed,  Surgical consent,  Pre-op evaluation,  At surgeon's request  Laterality: Left  Prep: chloraprep       Needles:  Injection technique: Single-shot  Needle Type: Echogenic Stimulator Needle     Needle Length: 10cm  Needle Gauge: 21   Needle insertion depth: 6 cm   Additional Needles:   Procedures:,,,, ultrasound used (permanent image in chart),,   Motor weakness within 5 minutes.  Narrative:  Start time: 06/18/2021 10:43 AM End time: 06/18/2021 10:48 AM Injection made incrementally with aspirations every 5 mL.  Performed by: Personally  Anesthesiologist: Josephine Igo, MD  Additional Notes: Timeout performed. Patient sedated. Relevant anatomy ID'd using [place]. Incremental 2-19ml injection of LA with frequent aspiration. Patient tolerated procedure well.    Left Supraclavicular Block

## 2021-06-18 NOTE — Transfer of Care (Signed)
Immediate Anesthesia Transfer of Care Note  Patient: [name]  Procedure(s) Performed: LEFT ARM ARTERIOVENOUS (AV) FISTULA CREATION (Left: Arm Upper)  Patient Location: PACU  Anesthesia Type:MAC  Level of Consciousness: awake and alert   Airway & Oxygen Therapy: Patient Spontanous Breathing and Patient connected to nasal cannula oxygen  Post-op Assessment: Report given to RN and Post -op Vital signs reviewed and stable  Post vital signs: Reviewed and stable  Last Vitals:  Vitals Value Taken Time  BP 143/77 06/18/21 1212  Temp 36.6 C 06/18/21 1211  Pulse 63 06/18/21 1220  Resp 13 06/18/21 1220  SpO2 100% 06/18/21 1220  Vitals shown include unvalidated device data.  Last Pain:  Vitals:   06/18/21 1211  TempSrc:   PainSc: Asleep      Patients Stated Pain Goal: 3 (16/10/96 0454)  Complications: No notable events documented.

## 2021-06-18 NOTE — TOC Progression Note (Signed)
Transition of Care [facility]) - Progression Note    Patient Details  Name: [name] MRN: [id number] Date of Birth: Jul 09, 1940  Transition of Care [facility]) CM/SW [place], LCSW Phone Number: 06/18/2021, 5:16 PM  Clinical Narrative:    CSW received consult for assistance with transportation to dialysis at discharge. CSW completed application for [facility] [place] and emailed to them for review. CSW discussed plan with [facility] leadership and it is recommended that patient have dialysis at the hospital on Saturday morning and then discharge home in order to give time for the [facility] [place] application to be approved. CSW will send patient with a taxi voucher to get to dialysis from home on Tuesday where she can then complete paperwork at the HD clinic.    Expected Discharge Plan: [place] Barriers to Discharge: Continued Medical Work up  Expected Discharge Plan and Services Expected Discharge Plan: [name]   Discharge Planning Services: CM Consult   Living arrangements for the past 2 months: Single Family Home                                       Social Determinants of Health (SDOH) Interventions    Readmission Risk Interventions No flowsheet data found.

## 2021-06-18 NOTE — Anesthesia Preprocedure Evaluation (Addendum)
Anesthesia Evaluation  Patient identified by MRN, date of birth, ID band Patient awake    Reviewed: Allergy & Precautions, NPO status , Patient's Chart, lab work & pertinent test results  Airway Mallampati: II  TM Distance: >3 FB Neck ROM: Full    Dental  (+) Edentulous Upper, Edentulous Lower   Pulmonary neg pulmonary ROS,    Pulmonary exam normal breath sounds clear to auscultation       Cardiovascular hypertension, Pt. on medications +CHF  Normal cardiovascular exam+ dysrhythmias Atrial Fibrillation  Rhythm:Regular Rate:Normal     Neuro/Psych PSYCHIATRIC DISORDERS Diabetic peripheral neuropathy  Neuromuscular disease    GI/Hepatic negative GI ROS, Neg liver ROS,   Endo/Other  diabetes, Well Controlled, Type 2, Oral Hypoglycemic AgentsHyperlipidemia  Renal/GU ARF and ESRFRenal disease  negative genitourinary   Musculoskeletal negative musculoskeletal ROS (+)   Abdominal   Peds  Hematology  (+) Blood dyscrasia, anemia , Thrombocytopenia Heparin therapy- 5,000 u SQ Q8h last dose 0540   Anesthesia Other Findings   Reproductive/Obstetrics                            Anesthesia Physical Anesthesia Plan  ASA: 3  Anesthesia Plan: MAC and Regional   Post-op Pain Management:    Induction: Intravenous  PONV Risk Score and Plan: 2 and Treatment may vary due to age or medical condition and Propofol infusion  Airway Management Planned: Natural Airway and Simple Face Mask  Additional Equipment:   Intra-op Plan:   Post-operative Plan:   Informed Consent: I have reviewed the patients History and Physical, chart, labs and discussed the procedure including the risks, benefits and alternatives for the proposed anesthesia with the patient or authorized representative who has indicated his/her understanding and acceptance.       Plan Discussed with: CRNA and Anesthesiologist  Anesthesia  Plan Comments:        Anesthesia Quick Evaluation

## 2021-06-18 NOTE — Progress Notes (Signed)
PT Cancellation Note  Patient Details Name: [name] MRN: [id number] DOB: Sep 10, 1940   Cancelled Treatment:    Reason Eval/Treat Not Completed: (P) Patient at procedure or test/unavailable (Pt in OR for permanent access placement.) Will continue efforts per PT POC as schedule permits.   Kara Pacer Ranferi Clingan 06/18/2021, 11:42 AM

## 2021-06-18 NOTE — Assessment & Plan Note (Signed)
stable °

## 2021-06-18 NOTE — Progress Notes (Signed)
°  Progress Note    06/18/2021 10:43 AM Day of Surgery  Subjective:  no overnight issues  Vitals:   06/18/21 0751 06/18/21 1009  BP: 134/75 (!) 166/77  Pulse: 81 85  Resp: 11 16  Temp: 97.7 F (36.5 C) 98 F (36.7 C)  SpO2: 97% 95%    Physical Exam: Aaox3 Palpable left radial pulse  CBC    Component Value Date/Time   WBC 5.4 06/17/2021 0800   RBC 2.90 (L) 06/17/2021 0800   HGB 10.2 (L) 06/18/2021 1029   HGB 9.3 (L) 01/13/2021 1143   HCT 30.0 (L) 06/18/2021 1029   HCT 28.5 (L) 01/13/2021 1143   PLT 139 (L) 06/17/2021 0800   PLT 165 01/13/2021 1143   MCV 93.1 06/17/2021 0800   MCV 87 01/13/2021 1143   MCH 30.0 06/17/2021 0800   MCHC 32.2 06/17/2021 0800   RDW 16.0 (H) 06/17/2021 0800   RDW 14.2 01/13/2021 1143   LYMPHSABS 0.9 06/14/2021 0146   MONOABS 0.6 06/14/2021 0146   EOSABS 0.2 06/14/2021 0146   BASOSABS 0.0 06/14/2021 0146    BMET    Component Value Date/Time   NA 139 06/18/2021 1029   NA 140 05/20/2021 1159   K 3.3 (L) 06/18/2021 1029   CL 102 06/18/2021 1029   CO2 20 (L) 06/17/2021 0800   GLUCOSE 155 (H) 06/18/2021 1029   BUN 24 (H) 06/18/2021 1029   BUN 37 (H) 05/20/2021 1159   CREATININE 3.50 (H) 06/18/2021 1029   CALCIUM 8.6 (L) 06/17/2021 0800   GFRNONAA 9 (L) 06/17/2021 0800   GFRAA 38 (L) 08/09/2019 1104    INR    Component Value Date/Time   INR 1.3 (H) 04/24/2021 1821     Intake/Output Summary (Last 24 hours) at 06/18/2021 1043 Last data filed at 06/18/2021 0900 Gross per 24 hour  Intake 265.69 ml  Output 1000 ml  Net -734.31 ml     Assessment:  81 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   All questions answered and r/b/a reiterated.    [name] C. Donzetta Matters, MD [facility] Office: [phone] Pager: [phone]  06/18/2021 10:43 AM

## 2021-06-18 NOTE — Anesthesia Procedure Notes (Signed)
Procedure Name: MAC Date/Time: 06/11/2021 11:03 AM Performed by: Eligha Bridegroom, CRNA Pre-anesthesia Checklist: Patient identified Patient Re-evaluated:Patient Re-evaluated prior to induction Oxygen Delivery Method: Nasal cannula Preoxygenation: Pre-oxygenation with 100% oxygen

## 2021-06-18 NOTE — Progress Notes (Signed)
Pt unavailable to discuss out-pt HD arrangements. Contacted pt's dtr, [name], via phone to discuss pt's out-pt HD clinic/schedule. Dtr aware pt approved for [facility] on TTS with 11:00 chair time. Dtr requested that navigator text info to dtr. Info text to dtr today. Dtr also informed navigator that pt's car is in need of repairs and pt will need transportation to HD. Contacted TOC staff with dtr's request for assistance with transportation. Spoke to clinic staff today who state that pt can only start on Saturday if pt comes to clinic tomorrow to complete paperwork. Pt's dtr states that pt and dtr can possibly take an Melburn Popper to clinic tomorrow to complete paperwork so pt can start on Saturday. It is unknown at this time if pt will be approved for transportation services to assist with transport on Saturday. If pt starts on Tuesday, pt will need to arrive at 10:00-10:15 to complete paperwork prior to treatment. Update regarding the above provided to nephrologist and [facility] staff.   Melven Sartorius Renal Navigator [phone]

## 2021-06-18 NOTE — Anesthesia Postprocedure Evaluation (Signed)
Anesthesia Post Note  Patient: [name]  Procedure(s) Performed: LEFT ARM ARTERIOVENOUS (AV) FISTULA CREATION (Left: Arm Upper)     Patient location during evaluation: PACU Anesthesia Type: Regional and MAC Level of consciousness: awake and alert and oriented Pain management: pain level controlled Vital Signs Assessment: post-procedure vital signs reviewed and stable Respiratory status: spontaneous breathing, nonlabored ventilation and respiratory function stable Cardiovascular status: stable and blood pressure returned to baseline Postop Assessment: no apparent nausea or vomiting Anesthetic complications: no   No notable events documented.  Last Vitals:  Vitals:   06/18/21 1211 06/18/21 1227  BP: (!) 143/77 (!) 149/77  Pulse:  79  Resp: 10 14  Temp: 36.6 C   SpO2: 100% 99%    Last Pain:  Vitals:   06/18/21 1211  TempSrc:   PainSc: Asleep                 Milon Dethloff A.

## 2021-06-18 NOTE — Progress Notes (Signed)
[facility] NEPHROLOGY PROGRESS NOTE  Assessment/ Plan: Pt is a 81 y.o. yo female with history of HTN, type II DM, A-fib, CKD stage V followed by Dr. Joelyn Oms for shortness of breath and fluid overload.  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 Status post right IJ TDC placed by IR on 2/13 and received first HD. Pt has ben accepted at [facility] on TTS with 11:00 chair time. Seen by vascular and plan for permanent access placement today. Status post second HD yesterday and tolerated well.  Plan for next hemodialysis tomorrow.  #Fluid overload: Received IV diuretics without much response and now managing with dialysis.  #Influenza A: Continue albuterol and supportive care per primary team.  # Anemia of CKD: Iron saturation is 11% therefore getting IV iron.  Continue Aranesp.  Monitor hemoglobin.   # Secondary hyperparathyroidism: PTH 202 at goal. Hyperphosphatemia is managed with Renvela and HD.  # HTN/volume: Blood pressure acceptable, volume managed with dialysis.  Discontinued IV diuretics.  #Hypokalemia due to diuretics:  Managing with HD.  Subjective: Seen and examined.  Patient is getting ready to go to the OR for permanent access placement.  Clinically stable.  No new event.   Objective Vital signs in last 24 hours: Vitals:   06/18/21 0751 06/18/21 1009 06/18/21 1029 06/18/21 1045  BP: 134/75 (!) 166/77 (!) 149/71 (!) 145/70  Pulse: 81 85 79 80  Resp: 11 16 16 10   Temp: 97.7 F (36.5 C) 98 F (36.7 C)    TempSrc: Axillary Oral    SpO2: 97% 95% 100% 100%  Weight:  77.4 kg 77.4 kg   Height:  5\' 4"  (1.626 m) 5\' 4"  (1.626 m)    Weight change:   Intake/Output Summary (Last 24 hours) at 06/18/2021 1131 Last data filed at 06/18/2021 0900 Gross per 24 hour  Intake 265.69 ml  Output 1000 ml  Net -734.31 ml        Labs: Basic Metabolic Panel: Recent Labs  Lab 06/13/21 0024 06/14/21 0146  06/15/21 0551 06/16/21 0108 06/17/21 0800 06/18/21 1029  NA 140   < > 140 140 139 139  K 3.7   < > 3.0* 3.5 3.6 3.3*  CL 107   < > 105 103 103 102  CO2 20*   < > 22 23 20*  --   GLUCOSE 147*   < > 79 123* 214* 155*  BUN 52*   < > 50* 34* 37* 24*  CREATININE 6.51*   < > 6.15* 4.80* 4.78* 3.50*  CALCIUM 8.4*   < > 8.4* 8.8* 8.6*  --   PHOS 6.3*  --   --  4.2 4.9*  --    < > = values in this interval not displayed.    Liver Function Tests: Recent Labs  Lab 06/12/21 1141 06/16/21 0108 06/17/21 0800  AST 23  --   --   ALT 12  --   --   ALKPHOS 62  --   --   BILITOT 0.7  --   --   PROT 6.8  --   --   ALBUMIN 3.3* 3.2* 3.1*    No results for input(s): LIPASE, AMYLASE in the last 168 hours. No results for input(s): AMMONIA in the last 168 hours. CBC: Recent Labs  Lab 06/12/21 1141 06/13/21 0024 06/14/21 0146 06/15/21 1734 06/16/21 0108 06/17/21 0800 06/18/21 1029  WBC 4.6 4.3 4.1 5.0 5.8 5.4  --  NEUTROABS 2.5  --  2.3  --   --   --   --   HGB 8.7* 7.9* 8.4* 8.6* 8.8* 8.7* 10.2*  HCT 28.8* 25.6* 26.5* 26.7* 28.6* 27.0* 30.0*  MCV 96.0 94.5 93.0 92.7 94.4 93.1  --   PLT 143* 135* 146* 150 125* 139*  --     Cardiac Enzymes: No results for input(s): CKTOTAL, CKMB, CKMBINDEX, TROPONINI in the last 168 hours. CBG: Recent Labs  Lab 06/17/21 0741 06/17/21 1319 06/17/21 1543 06/17/21 2007 06/18/21 0749  GLUCAP 203* 123* 176* 217* 149*     Iron Studies:  Recent Labs    06/16/21 0108  IRON 32  TIBC 283  FERRITIN 142    Studies/Results: VAS [place] UPPER EXT VEIN MAPPING (PRE-OP AVF)  Result Date: 06/17/2021 UPPER EXTREMITY VEIN MAPPING Patient Name:  [name]  Date of Exam:   06/17/2021 Medical Rec #: [id number]      Accession #:    [id number] Date of Birth: 1940/09/13      Patient Gender: F Patient Age:   73 years Exam Location:  [facility] Procedure:      VAS [place] UPPER EXT VEIN MAPPING (PRE-OP AVF) Referring Phys: Lawson Radar  --------------------------------------------------------------------------------  Indications: Pre-access. History: CKD.  Comparison Study: No prior study Performing Technologist: Maudry Mayhew MHA, RDMS, RVT, RDCS  Examination Guidelines: A complete evaluation includes B-mode imaging, spectral Doppler, color Doppler, and power Doppler as needed of all accessible portions of each vessel. Bilateral testing is considered an integral part of a complete examination. Limited examinations for reoccurring indications may be performed as noted. +-----------------+-------------+----------+--------------+  Right Cephalic    Diameter (cm) Depth (cm)    Findings     +-----------------+-------------+----------+--------------+  Shoulder              0.23                                 +-----------------+-------------+----------+--------------+  Prox upper arm        0.14                                 +-----------------+-------------+----------+--------------+  Mid upper arm         0.12                                 +-----------------+-------------+----------+--------------+  Dist upper arm        0.14                                 +-----------------+-------------+----------+--------------+  Antecubital fossa     0.09                                 +-----------------+-------------+----------+--------------+  Prox forearm          0.21                                 +-----------------+-------------+----------+--------------+  Mid forearm  not visualized  +-----------------+-------------+----------+--------------+  Dist forearm                               not visualized  +-----------------+-------------+----------+--------------+  Wrist                                      not visualized  +-----------------+-------------+----------+--------------+ +-----------------+-------------+----------+--------------+  Right Basilic     Diameter (cm) Depth (cm)    Findings      +-----------------+-------------+----------+--------------+  Shoulder                                   not visualized  +-----------------+-------------+----------+--------------+  Prox upper arm                             not visualized  +-----------------+-------------+----------+--------------+  Mid upper arm                              not visualized  +-----------------+-------------+----------+--------------+  Dist upper arm                             not visualized  +-----------------+-------------+----------+--------------+  Antecubital fossa                          not visualized  +-----------------+-------------+----------+--------------+  Prox forearm                               not visualized  +-----------------+-------------+----------+--------------+  Mid forearm                                not visualized  +-----------------+-------------+----------+--------------+  Distal forearm                             not visualized  +-----------------+-------------+----------+--------------+  Wrist                                      not visualized  +-----------------+-------------+----------+--------------+ +-----------------+-------------+----------+--------------+  Left Cephalic     Diameter (cm) Depth (cm)    Findings     +-----------------+-------------+----------+--------------+  Prox upper arm        0.12                                 +-----------------+-------------+----------+--------------+  Mid upper arm         0.11                                 +-----------------+-------------+----------+--------------+  Dist upper arm        0.10                                 +-----------------+-------------+----------+--------------+  Antecubital fossa  0.10                                 +-----------------+-------------+----------+--------------+  Prox forearm                               not visualized  +-----------------+-------------+----------+--------------+  Mid forearm                                 not visualized  +-----------------+-------------+----------+--------------+  Dist forearm                               not visualized  +-----------------+-------------+----------+--------------+  Wrist                                      not visualized  +-----------------+-------------+----------+--------------+ +-----------------+-------------+----------+---------+  Left Basilic      Diameter (cm) Depth (cm) Findings   +-----------------+-------------+----------+---------+  Prox upper arm        0.19                            +-----------------+-------------+----------+---------+  Mid upper arm         0.23                            +-----------------+-------------+----------+---------+  Dist upper arm        0.24                 branching  +-----------------+-------------+----------+---------+  Antecubital fossa     0.22                            +-----------------+-------------+----------+---------+  Prox forearm          0.22                            +-----------------+-------------+----------+---------+  Mid forearm           0.16                 branching  +-----------------+-------------+----------+---------+  Distal forearm        0.22                            +-----------------+-------------+----------+---------+  Wrist                 0.13                            +-----------------+-------------+----------+---------+ *See table(s) above for measurements and observations.  Diagnosing physician: Harold Barban MD Electronically signed by Harold Barban MD on 06/17/2021 at 7:40:53 PM.    Final     Medications: Infusions:  sodium chloride 10 mL/hr at 06/18/21 1103   [MAR Hold]  ceFAZolin (ANCEF) IV       Scheduled Medications:  [MAR Hold] Chlorhexidine Gluconate Cloth  6 each Topical Q0600   [MAR Hold] darbepoetin (ARANESP) injection - DIALYSIS  60 mcg Intravenous Q Fri-HD  fentaNYL (SUBLIMAZE) injection  50 mcg Intravenous Once   [MAR Hold] heparin  5,000 Units  Subcutaneous Q8H   [MAR Hold] insulin aspart  0-9 Units Subcutaneous TID WC   [MAR Hold] pravastatin  20 mg Oral Daily   predniSONE  40 mg Oral Q breakfast   [MAR Hold] sevelamer carbonate  800 mg Oral TID WC   [MAR Hold] sodium chloride flush  10-40 mL Intracatheter Q12H    have reviewed scheduled and prn medications.  Physical Exam: General: Not in distress, looks comfortable Heart:RRR, s1s2 nl Lungs: Clear b/l, no crackle Abdomen:soft, Non-tender, non-distended Extremities:Some LE edema+ Dialysis Access: Right IJ TDC in place.  [name] James Lafalce 06/18/2021,11:31 AM  LOS: 6 days

## 2021-06-18 NOTE — Op Note (Signed)
° ° °  [name] MRN: [id number] DOB: [birth year] Sex: female  06/18/2021 Pre-operative Diagnosis: End-stage renal disease Post-operative diagnosis:  Same Surgeon:  Erlene Quan C. Donzetta Matters, MD Assistant: Laurence Slate, PA Procedure Performed: Left arm brachial artery to basilic vein av fistula creation  Indications: 81 year old female now with end-stage renal disease.  She is planned for left arm dialysis access procedure.  An assistant was necessary to facilitate exposure and expedite the case.  Findings: The basilic vein measured about 5 mm at the antecubital vein was patent.  The brachial artery was 3 mm and was free of disease.  At completion there was a very strong thrill in the basilic vein and a palpable radial artery pulse at the wrist both confirmed with Doppler.   Procedure:  The patient was identified in the holding area and taken to the operating where she is placed supine on the operative table and MAC anesthesia was induced.  She was gently prepped draped in the left upper extremity usual fashion, antibiotics were administered and a timeout was called.  Ultrasound was used to identify was a very large basilic vein at the level of the antecubitum and a healthy-appearing brachial artery.  Preoperative block was placed and this was tested and noted to be intact.  A transverse incision was made the vein was dissected out branches divided between clips and ties and the vein was marked for orientation.  I dissected the deep fascia the brachial artery and placed a vessel loop around this.  The artery was clamped distally and proximally opened longitudinally flushed with heparinized saline in both directions.  The vein was clamped distally tied off flush with heparinized saline and spatulated.  The vein was then sewn into side to the artery with 6-0 Prolene suture.  Prior completion without flushing all directions.  Upon completion there was a very strong thrill in the fistula palpable  radial artery pulse the wrist.  Satisfied with this we irrigated the wound obtain hemostasis and closed in layers of Vicryl and Monocryl.  Dermabond is placed in the skin level.  She was awakened from anesthesia having tolerated procedure well without immediate complication.  All counts were correct at completion.  EBL: 20 cc     Santiago Stenzel C. Donzetta Matters, MD [facility] [place] Office: [phone] Pager: [phone]

## 2021-06-19 ENCOUNTER — Encounter (HOSPITAL_COMMUNITY): Payer: Self-pay | Admitting: Vascular Surgery

## 2021-06-19 DIAGNOSIS — Z992 Dependence on renal dialysis: Secondary | ICD-10-CM

## 2021-06-19 DIAGNOSIS — Z48812 Encounter for surgical aftercare following surgery on the circulatory system: Secondary | ICD-10-CM

## 2021-06-19 LAB — CBC
HCT: 28.1 % — ABNORMAL LOW (ref 36.0–46.0)
Hemoglobin: 8.7 g/dL — ABNORMAL LOW (ref 12.0–15.0)
MCH: 29.6 pg (ref 26.0–34.0)
MCHC: 31 g/dL (ref 30.0–36.0)
MCV: 95.6 fL (ref 80.0–100.0)
Platelets: 127 10*3/uL — ABNORMAL LOW (ref 150–400)
RBC: 2.94 MIL/uL — ABNORMAL LOW (ref 3.87–5.11)
RDW: 16.5 % — ABNORMAL HIGH (ref 11.5–15.5)
WBC: 6.7 10*3/uL (ref 4.0–10.5)
nRBC: 3.5 % — ABNORMAL HIGH (ref 0.0–0.2)

## 2021-06-19 LAB — RENAL FUNCTION PANEL
Albumin: 3 g/dL — ABNORMAL LOW (ref 3.5–5.0)
Anion gap: 12 (ref 5–15)
BUN: 28 mg/dL — ABNORMAL HIGH (ref 8–23)
CO2: 25 mmol/L (ref 22–32)
Calcium: 8.6 mg/dL — ABNORMAL LOW (ref 8.9–10.3)
Chloride: 101 mmol/L (ref 98–111)
Creatinine, Ser: 3.72 mg/dL — ABNORMAL HIGH (ref 0.44–1.00)
GFR, Estimated: 12 mL/min — ABNORMAL LOW (ref >60)
Glucose, Bld: 145 mg/dL — ABNORMAL HIGH (ref 70–99)
Phosphorus: 3.3 mg/dL (ref 2.5–4.6)
Potassium: 3.1 mmol/L — ABNORMAL LOW (ref 3.5–5.1)
Sodium: 138 mmol/L (ref 135–145)

## 2021-06-19 LAB — GLUCOSE, CAPILLARY
Glucose-Capillary: 141 mg/dL — ABNORMAL HIGH (ref 70–99)
Glucose-Capillary: 72 mg/dL (ref 70–99)
Glucose-Capillary: 161 mg/dL — ABNORMAL HIGH (ref 70–99)

## 2021-06-19 MED ORDER — PENTAFLUOROPROP-TETRAFLUOROETH EX AERO: 1.0000 "application " | INHALATION_SPRAY | CUTANEOUS | Status: DC | PRN

## 2021-06-19 MED ORDER — HEPARIN SODIUM (PORCINE) 1000 UNIT/ML DIALYSIS: 20.0000 [IU]/kg | INTRAMUSCULAR | Status: DC | PRN

## 2021-06-19 MED ORDER — LIDOCAINE HCL (PF) 1 % IJ SOLN: 5.0000 mL | INTRAMUSCULAR | Status: DC | PRN

## 2021-06-19 MED ORDER — HYDROCODONE-ACETAMINOPHEN 5-325 MG PO TABS: 1.0000 | ORAL_TABLET | Freq: Four times a day (QID) | ORAL | 0 refills | Status: DC | PRN

## 2021-06-19 MED ORDER — SODIUM CHLORIDE 0.9 % IV SOLN: 100.0000 mL | INTRAVENOUS | Status: DC | PRN

## 2021-06-19 MED ORDER — SEVELAMER CARBONATE 800 MG PO TABS: 800.0000 mg | ORAL_TABLET | Freq: Three times a day (TID) | ORAL | 0 refills | Status: AC

## 2021-06-19 MED ORDER — HEPARIN SODIUM (PORCINE) 1000 UNIT/ML DIALYSIS
1000.0000 [IU] | INTRAMUSCULAR | Status: DC | PRN
  Administered 2021-06-19: 1000 [IU] via INTRAVENOUS_CENTRAL
  Filled 2021-06-19: qty 1

## 2021-06-19 MED ORDER — ALTEPLASE 2 MG IJ SOLR: 2.0000 mg | Freq: Once | INTRAMUSCULAR | Status: DC | PRN

## 2021-06-19 MED ORDER — LIDOCAINE-PRILOCAINE 2.5-2.5 % EX CREA: 1.0000 "application " | TOPICAL_CREAM | CUTANEOUS | Status: DC | PRN

## 2021-06-19 NOTE — Discharge Instructions (Signed)
[facility]  Discharge Instructions  AV Fistula or Graft Surgery for Dialysis Access  Please refer to the following instructions for your post-procedure care. Your surgeon or physician assistant will discuss any changes with you.  Activity  You may drive the day following your surgery, if you are comfortable and no longer taking prescription pain medication. Resume full activity as the soreness in your incision resolves.  Bathing/Showering  You may shower after you go home. Keep your incision dry for 48 hours. Do not soak in a bathtub, hot tub, or swim until the incision heals completely. You may not shower if you have a hemodialysis catheter.  Incision Care  Clean your incision with mild soap and water after 48 hours. Pat the area dry with a clean towel. You do not need a bandage unless otherwise instructed. Do not apply any ointments or creams to your incision. You may have skin glue on your incision. Do not peel it off. It will come off on its own in about one week. Your arm may swell a bit after surgery. To reduce swelling use pillows to elevate your arm so it is above your heart. Your doctor will tell you if you need to lightly wrap your arm with an ACE bandage.  Diet  Resume your normal diet. There are not special food restrictions following this procedure. In order to heal from your surgery, it is CRITICAL to get adequate nutrition. Your body requires vitamins, minerals, and protein. Vegetables are the best source of vitamins and minerals. Vegetables also provide the perfect balance of protein. Processed food has little nutritional value, so try to avoid this.  Medications  Resume taking all of your medications. If your incision is causing pain, you may take over-the counter pain relievers such as acetaminophen (Tylenol). If you were prescribed a stronger pain medication, please be aware these medications can cause nausea and constipation. Prevent  nausea by taking the medication with a snack or meal. Avoid constipation by drinking plenty of fluids and eating foods with high amount of fiber, such as fruits, vegetables, and grains.  Do not take Tylenol if you are taking prescription pain medications.  Follow up Your surgeon may want to see you in the office following your access surgery. If so, this will be arranged at the time of your surgery.  Please call us immediately for any of the following conditions:  Increased pain, redness, drainage (pus) from your incision site Fever of 101 degrees or higher Severe or worsening pain at your incision site Hand pain or numbness.  Reduce your risk of vascular disease:  Stop smoking. If you would like help, call [facility] at 1-800-QUIT-NOW (939) 128-3356) or [place] at [place] your cholesterol Maintain a desired weight Control your diabetes Keep your blood pressure down  Dialysis  It will take several weeks to several months for your new dialysis access to be ready for use. Your surgeon will determine when it is okay to use it. Your nephrologist will continue to direct your dialysis. You can continue to use your Permcath until your new access is ready for use.   06/19/2021 [name] [id number] 1940/07/02  Surgeon(s): [name], MD  Procedure(s): LEFT ARM ARTERIOVENOUS (AV) FISTULA CREATION   May stick graft immediately   May stick graft on designated area only:   X Do not stick left AV fistula for 12 weeks    If you have any questions, please call the office at  [phone]. °

## 2021-06-19 NOTE — Progress Notes (Signed)
Per Dr. Wynelle Cleveland, pt is to be d/c to home w/tunneled HD cath.

## 2021-06-19 NOTE — Care Management (Signed)
Taxi vouchers given for patient to obtain ride to dialysis on Tuesday

## 2021-06-19 NOTE — Progress Notes (Signed)
[facility] NEPHROLOGY PROGRESS NOTE  Assessment/ Plan: Pt is a 81 y.o. yo female with history of HTN, type II DM, A-fib, CKD stage V followed by Dr. Joelyn Oms for shortness of breath and fluid overload.  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 Status post right IJ TDC placed by IR on 2/13 and received first HD. Pt has ben accepted at [facility] on TTS with 11:00 chair time. S/p AVF w/ Dr Donzetta Matters on 2/16 appreciate help HD again today.  Plan to maintain MWF schedule here and then can be TTS outpatient    #Fluid overload: Received IV diuretics without much response and now managing with dialysis.  #Influenza A: Continue albuterol and supportive care per primary team.  # Anemia of CKD: Iron saturation is 11% therefore getting IV iron.  Continue Aranesp.  Monitor hemoglobin.   # Secondary hyperparathyroidism: PTH 202 at goal. Hyperphosphatemia is managed with Renvela and HD.  # HTN/volume: Blood pressure acceptable, volume managed with dialysis.  Discontinued IV diuretics.  #Hypokalemia due to diuretics:  Managing with HD.  Subjective: No complaints today.  Planning for dialysis again today.  Underwent AVF creation yesterday with no issues.   Objective Vital signs in last 24 hours: Vitals:   06/19/21 1000 06/19/21 1030 06/19/21 1100 06/19/21 1130  BP: 131/63 96/80 (!) 149/70 119/78  Pulse: 83 83 83 83  Resp: 12 (!) 21 17 15   Temp:      TempSrc:      SpO2:      Weight:      Height:       Weight change: 0.7 kg  Intake/Output Summary (Last 24 hours) at 06/19/2021 1148 Last data filed at 06/18/2021 1500 Gross per 24 hour  Intake 8.5 ml  Output --  Net 8.5 ml       Labs: Basic Metabolic Panel: Recent Labs  Lab 06/16/21 0108 06/17/21 0800 06/18/21 1029 06/19/21 0922  NA 140 139 139 138  K 3.5 3.6 3.3* 3.1*  CL 103 103 102 101  CO2 23 20*  --  25  GLUCOSE 123* 214* 155* 145*  BUN 34* 37* 24*  28*  CREATININE 4.80* 4.78* 3.50* 3.72*  CALCIUM 8.8* 8.6*  --  8.6*  PHOS 4.2 4.9*  --  3.3   Liver Function Tests: Recent Labs  Lab 06/16/21 0108 06/17/21 0800 06/19/21 0922  ALBUMIN 3.2* 3.1* 3.0*   No results for input(s): LIPASE, AMYLASE in the last 168 hours. No results for input(s): AMMONIA in the last 168 hours. CBC: Recent Labs  Lab 06/14/21 0146 06/15/21 1734 06/16/21 0108 06/17/21 0800 06/18/21 1029 06/19/21 0922  WBC 4.1 5.0 5.8 5.4  --  6.7  NEUTROABS 2.3  --   --   --   --   --   HGB 8.4* 8.6* 8.8* 8.7* 10.2* 8.7*  HCT 26.5* 26.7* 28.6* 27.0* 30.0* 28.1*  MCV 93.0 92.7 94.4 93.1  --  95.6  PLT 146* 150 125* 139*  --  127*   Cardiac Enzymes: No results for input(s): CKTOTAL, CKMB, CKMBINDEX, TROPONINI in the last 168 hours. CBG: Recent Labs  Lab 06/17/21 1543 06/17/21 2007 06/18/21 0749 06/18/21 1548 06/19/21 0814  GLUCAP 176* 217* 149* 140* 141*    Iron Studies: No results for input(s): IRON, TIBC, TRANSFERRIN, FERRITIN in the last 72 hours. Studies/Results: VAS [place] UPPER EXT VEIN MAPPING (PRE-OP AVF)  Result Date: 06/17/2021 UPPER EXTREMITY VEIN MAPPING Patient  Name:  ELERI RUBEN  Date of Exam:   06/17/2021 Medical Rec #: [id number]      Accession #:    [id number] Date of Birth: 1940-12-08      Patient Gender: F Patient Age:   23 years Exam Location:  [facility] Procedure:      VAS [place] UPPER EXT VEIN MAPPING (PRE-OP AVF) Referring Phys: Lawson Radar --------------------------------------------------------------------------------  Indications: Pre-access. History: CKD.  Comparison Study: No prior study Performing Technologist: Maudry Mayhew MHA, RDMS, RVT, RDCS  Examination Guidelines: A complete evaluation includes B-mode imaging, spectral Doppler, color Doppler, and power Doppler as needed of all accessible portions of each vessel. Bilateral testing is considered an integral part of a complete examination. Limited examinations for  reoccurring indications may be performed as noted. +-----------------+-------------+----------+--------------+  Right Cephalic    Diameter (cm) Depth (cm)    Findings     +-----------------+-------------+----------+--------------+  Shoulder              0.23                                 +-----------------+-------------+----------+--------------+  Prox upper arm        0.14                                 +-----------------+-------------+----------+--------------+  Mid upper arm         0.12                                 +-----------------+-------------+----------+--------------+  Dist upper arm        0.14                                 +-----------------+-------------+----------+--------------+  Antecubital fossa     0.09                                 +-----------------+-------------+----------+--------------+  Prox forearm          0.21                                 +-----------------+-------------+----------+--------------+  Mid forearm                                not visualized  +-----------------+-------------+----------+--------------+  Dist forearm                               not visualized  +-----------------+-------------+----------+--------------+  Wrist                                      not visualized  +-----------------+-------------+----------+--------------+ +-----------------+-------------+----------+--------------+  Right Basilic     Diameter (cm) Depth (cm)    Findings     +-----------------+-------------+----------+--------------+  Shoulder  not visualized  +-----------------+-------------+----------+--------------+  Prox upper arm                             not visualized  +-----------------+-------------+----------+--------------+  Mid upper arm                              not visualized  +-----------------+-------------+----------+--------------+  Dist upper arm                             not visualized   +-----------------+-------------+----------+--------------+  Antecubital fossa                          not visualized  +-----------------+-------------+----------+--------------+  Prox forearm                               not visualized  +-----------------+-------------+----------+--------------+  Mid forearm                                not visualized  +-----------------+-------------+----------+--------------+  Distal forearm                             not visualized  +-----------------+-------------+----------+--------------+  Wrist                                      not visualized  +-----------------+-------------+----------+--------------+ +-----------------+-------------+----------+--------------+  Left Cephalic     Diameter (cm) Depth (cm)    Findings     +-----------------+-------------+----------+--------------+  Prox upper arm        0.12                                 +-----------------+-------------+----------+--------------+  Mid upper arm         0.11                                 +-----------------+-------------+----------+--------------+  Dist upper arm        0.10                                 +-----------------+-------------+----------+--------------+  Antecubital fossa     0.10                                 +-----------------+-------------+----------+--------------+  Prox forearm                               not visualized  +-----------------+-------------+----------+--------------+  Mid forearm                                not visualized  +-----------------+-------------+----------+--------------+  Dist forearm  not visualized  +-----------------+-------------+----------+--------------+  Wrist                                      not visualized  +-----------------+-------------+----------+--------------+ +-----------------+-------------+----------+---------+  Left Basilic      Diameter (cm) Depth (cm) Findings    +-----------------+-------------+----------+---------+  Prox upper arm        0.19                            +-----------------+-------------+----------+---------+  Mid upper arm         0.23                            +-----------------+-------------+----------+---------+  Dist upper arm        0.24                 branching  +-----------------+-------------+----------+---------+  Antecubital fossa     0.22                            +-----------------+-------------+----------+---------+  Prox forearm          0.22                            +-----------------+-------------+----------+---------+  Mid forearm           0.16                 branching  +-----------------+-------------+----------+---------+  Distal forearm        0.22                            +-----------------+-------------+----------+---------+  Wrist                 0.13                            +-----------------+-------------+----------+---------+ *See table(s) above for measurements and observations.  Diagnosing physician: Harold Barban MD Electronically signed by Harold Barban MD on 06/17/2021 at 7:40:53 PM.    Final     Medications: Infusions:  sodium chloride     sodium chloride       Scheduled Medications:  Chlorhexidine Gluconate Cloth  6 each Topical Q0600   Chlorhexidine Gluconate Cloth  6 each Topical Q0600   darbepoetin (ARANESP) injection - DIALYSIS  60 mcg Intravenous Q Fri-HD   fentaNYL (SUBLIMAZE) injection  50 mcg Intravenous Once   heparin  5,000 Units Subcutaneous Q8H   insulin aspart  0-9 Units Subcutaneous TID WC   pravastatin  20 mg Oral Daily   sevelamer carbonate  800 mg Oral TID WC   sodium chloride flush  10-40 mL Intracatheter Q12H    have reviewed scheduled and prn medications.  Physical Exam: General: Sitting in bed, no distress Heart: Normal rate Lungs: Bilateral chest rise with no increased work of breathing Abdomen:soft, Non-tender, non-distended Extremities: Trace edema, warm and  well-perfused Dialysis Access: Right IJ TDC in place.  Shaune Pollack Jmichael Gille 06/19/2021,11:48 AM  LOS: 7 days

## 2021-06-19 NOTE — Discharge Summary (Signed)
Physician Discharge Summary   Patient: [name] MRN: [id number] DOB: 1940-07-22  Admit date:     06/12/2021  Discharge date: 06/19/21  Discharge Physician: Debbe Odea   PCP: Trey Sailors, PA   Recommendations at discharge:    Continue to work on diabetes control  Discharge Diagnoses: Principal Problem:   Acute kidney injury superimposed on chronic kidney disease ([place]) Active Problems:   Acute on chronic diastolic CHF (congestive heart failure) ([place])   Influenza A   Essential hypertension   Hyperlipidemia   DM (diabetes mellitus), type 2 with renal complications ([employer])   Normocytic anemia   Pyuria   Thrombocytopenia ([place]), chronic  Resolved Problems:   * No resolved hospital problems. *   Hospital Course:  [name] is a 81 y.o. female with medical history significant of CKD stage IV, hypertension, hyperlipidemia, diabetes type 2, paroxysmal A-fib not on anticoagulation who was sent from nephrology office to the ER for the evaluation of worsening kidney function & bilateral lower extremity swelling.  Lab work showed severe elevated creatinine in the range of 6.  Nephrology consulted.  Started on IV diuretics.  Noted to be influenza positive.      Assessment and Plan: * Acute kidney injury superimposed on chronic kidney disease ([name])- (present on admission) History of CKD stage IV.   She was admitted and was discharged on 1/26 /23 for the same.    On her last admission, her AKI was thought to be secondary to NSAIDs use, ARB use, borderline hypotension.  She was being followed by [facility] as an outpatient and was sent for admission here after she was seen in the office. - 2/13 tunneled cath placed and started on dialysis    - Left arm brachial artery to basilic vein av fistula created on 2/16 - she is stable to dc today after dialysis and will transition to Tu/Th/Sat dialysis as outpt  Acute on chronic diastolic CHF (congestive heart failure)  ([place]) -Echo done on 12/26/2020> EF of 60 to 88%, grade 3 diastolic dysfunction, moderate mitral regurgitation, severe tricuspid regurgitation, moderate pulmonary regurgitation, moderate pulmonary hypertension.    Influenza A  Tamiflu given from 2/10-2/12  - noted to be wheezing on 2/14 and then started on Prednisone 40 mg daily x 3 days  Thrombocytopenia (H[facility]), chronic stable  Pyuria UA showed cloudy urine with large leukocytes, many bacteria.   Empirically started on ceftriaxone but discontinued because of finding of multiple species in the urine culture  Normocytic anemia This is most likely associated with CKD.        DM (diabetes mellitus), type 2 with renal complications ([name])- (present on admission) - on 5 U of 75/25 daily  - last A1c quite high Hemoglobin A1C    Component Value Date/Time   HGBA1C 9.6 (H) 04/25/2021 0413     Hyperlipidemia- (present on admission) - cont pravastatin    Essential hypertension- (present on admission)  - amlodipine 5 mg, Imdur 30 mg on hold- BP stable - Diltiazem is listed on her home med list but she does not take it           Consultants: Nephrology, IR, Vascular surgery   Disposition: Home Diet recommendation:  Renal diet  DISCHARGE MEDICATION: Allergies as of 06/19/2021       Reactions   Amlodipine Other (See Comments)   Patient has LE edema to this med at higher doses (10 mg)        Medication List  STOP taking these medications    amLODipine 5 MG tablet Commonly known as: NORVASC   diltiazem 180 MG 24 hr capsule Commonly known as: CARDIZEM CD   isosorbide dinitrate 30 MG tablet Commonly known as: ISORDIL   sodium bicarbonate 650 MG tablet       TAKE these medications    Accu-Chek FastClix Lancets Misc See admin instructions.   Accu-Chek Guide Me w/Device Kit See admin instructions.   acetaminophen 500 MG tablet Commonly known as: TYLENOL Take 500 mg by mouth every 6 (six) hours as  needed for mild pain, fever or headache.   BD Pen Needle Nano 2nd Gen 32G X 4 MM Misc Generic drug: Insulin Pen Needle 3 (three) times daily. as directed   HumuLIN 70/30 KwikPen (70-30) 100 UNIT/ML KwikPen Generic drug: insulin isophane & regular human KwikPen Inject 5 Units into the skin daily.   HYDROcodone-acetaminophen 5-325 MG tablet Commonly known as: NORCO/VICODIN Take 1 tablet by mouth every 6 (six) hours as needed for moderate pain.   MUSCLE RUB EX Apply 1 application topically daily as needed (leg cramps).   pravastatin 20 MG tablet Commonly known as: PRAVACHOL Take 1 tablet (20 mg total) by mouth daily.   sevelamer carbonate 800 MG tablet Commonly known as: RENVELA Take 1 tablet (800 mg total) by mouth 3 (three) times daily with meals.        Follow-up Information     [name], MD Follow up in 4 week(s).   Specialties: Vascular Surgery, Cardiology Why: Office will call you to arrange your appt (sent) Contact information: [address] [place] [address] [place], [facility]. Go on 06/23/2021.   Why: Schedule is Tuesday/Thursday/Saturday with 11:00 chair time.  Patient will need to arrive at 10:00-10:15 on Tuesday (2/21) to complete paperwork prior to treatment. Contact information: [place] [address] [phone]                 Discharge Exam: [name] Weights   06/18/21 1029 06/19/21 0855 06/19/21 1230  Weight: 77.4 kg 77.9 kg 76 kg        Imaging Studies: DG Chest 2 View  Result Date: 06/12/2021 CLINICAL DATA:  Shortness of breath, lower extremity swelling, concerned that she may need to start dialysis, diabetes mellitus, hypertension EXAM: CHEST - 2 VIEW COMPARISON:  06/01/2021 FINDINGS: Enlargement of cardiac silhouette with pulmonary vascular congestion. Small bibasilar pleural effusions and minimal basilar atelectasis. No definite acute consolidation or  pulmonary edema. No pneumothorax. IMPRESSION: Enlargement of cardiac silhouette with pulmonary vascular congestion. Small bibasilar pleural effusions and minimal atelectasis. Electronically Signed   By: Lavonia Dana M.D.   On: 06/12/2021 12:35   DG Chest 2 View  Result Date: 06/01/2021 CLINICAL DATA:  Shortness of breath. EXAM: CHEST - 2 VIEW COMPARISON:  Chest x-ray 05/22/2021. FINDINGS: The heart is enlarged, unchanged. Small pleural effusions are present. No lung infiltrate or pneumothorax. No acute fractures. IMPRESSION: 1. Cardiomegaly with small bilateral pleural effusions. Electronically Signed   By: Ronney Asters M.D.   On: 06/01/2021 18:19   US RENAL  Result Date: 05/22/2021 CLINICAL DATA:  AKI. EXAM: RENAL / URINARY TRACT ULTRASOUND COMPLETE COMPARISON:  04/28/2021. FINDINGS: Right Kidney: Renal measurements: 9.3 x 3.9 x 3.7 cm = volume: 71.0 mL. Echogenicity within normal limits. No mass or hydronephrosis visualized. Left Kidney: Renal measurements: 9.1 x 4.5 x 5.2 cm = volume: 110.1 mL. Echogenicity within  normal limits. No mass or hydronephrosis visualized. Bladder: Appears normal for degree of bladder distention. Other: There are small bilateral pleural effusions. Ascites is noted in the pelvis. Evaluation of the left kidney is limited due to patient's body habitus. IMPRESSION: 1. Normal evaluation of the kidneys and bladder. 2. Bilateral pleural effusions. 3. Ascites in the pelvis. Electronically Signed   By: Brett Fairy M.D.   On: 05/22/2021 01:02   IR Fluoro Guide CV Line Right  Result Date: 06/15/2021 CLINICAL DATA:  Renal failure and need for tunneled hemodialysis catheter. EXAM: TUNNELED CENTRAL VENOUS HEMODIALYSIS CATHETER PLACEMENT WITH ULTRASOUND AND FLUOROSCOPIC GUIDANCE ANESTHESIA/SEDATION: Moderate (conscious) sedation was employed during this procedure. A total of Versed 1.0 mg and Fentanyl 25 mcg was administered intravenously by radiology nursing. Moderate Sedation Time: 25  minutes. The patient's level of consciousness and vital signs were monitored continuously by radiology nursing throughout the procedure under my direct supervision. MEDICATIONS: 2 g IV Ancef. FLUOROSCOPY: 30 seconds.  2.0 mGy. PROCEDURE: The procedure, risks, benefits, and alternatives were explained to the patient. Questions regarding the procedure were encouraged and answered. The patient understands and consents to the procedure. A timeout was performed prior to initiating the procedure. The right neck and chest were prepped with chlorhexidine in a sterile fashion, and a sterile drape was applied covering the operative field. Maximum barrier sterile technique with sterile gowns and gloves were used for the procedure. Local anesthesia was provided with 1% lidocaine. Ultrasound was used to confirm patency of the right internal jugular vein. A permanent ultrasound image was recorded. After creating a small venotomy incision, a 21 gauge needle was advanced into the right internal jugular vein under direct, real-time ultrasound guidance. Ultrasound image documentation was performed. After securing guidewire access, an 8 Fr dilator was placed. A J-wire was kinked to measure appropriate catheter length. A Palindrome tunneled hemodialysis catheter measuring 19 cm from tip to cuff was chosen for placement. This was tunneled in a retrograde fashion from the chest wall to the venotomy incision. At the venotomy, serial dilatation was performed and a 15 Fr peel-away sheath was placed over a guidewire. The catheter was then placed through the sheath and the sheath removed. Final catheter positioning was confirmed and documented with a fluoroscopic spot image. The catheter was aspirated, flushed with saline, and injected with appropriate volume heparin dwells. The venotomy incision was closed with subcuticular 4-0 Vicryl. Dermabond was applied to the incision. The catheter exit site was secured with 0-Prolene retention sutures.  COMPLICATIONS: None.  No pneumothorax. FINDINGS: After catheter placement, the tip lies in the right atrium. The catheter aspirates normally and is ready for immediate use. IMPRESSION: Placement of tunneled hemodialysis catheter via the right internal jugular vein. The catheter tip lies in the right atrium. The catheter is ready for immediate use. Electronically Signed   By: Aletta Edouard M.D.   On: 06/15/2021 10:23   IR US Guide Vasc Access Right  Result Date: 06/15/2021 CLINICAL DATA:  Renal failure and need for tunneled hemodialysis catheter. EXAM: TUNNELED CENTRAL VENOUS HEMODIALYSIS CATHETER PLACEMENT WITH ULTRASOUND AND FLUOROSCOPIC GUIDANCE ANESTHESIA/SEDATION: Moderate (conscious) sedation was employed during this procedure. A total of Versed 1.0 mg and Fentanyl 25 mcg was administered intravenously by radiology nursing. Moderate Sedation Time: 25 minutes. The patient's level of consciousness and vital signs were monitored continuously by radiology nursing throughout the procedure under my direct supervision. MEDICATIONS: 2 g IV Ancef. FLUOROSCOPY: 30 seconds.  2.0 mGy. PROCEDURE: The procedure, risks, benefits, and alternatives  were explained to the patient. Questions regarding the procedure were encouraged and answered. The patient understands and consents to the procedure. A timeout was performed prior to initiating the procedure. The right neck and chest were prepped with chlorhexidine in a sterile fashion, and a sterile drape was applied covering the operative field. Maximum barrier sterile technique with sterile gowns and gloves were used for the procedure. Local anesthesia was provided with 1% lidocaine. Ultrasound was used to confirm patency of the right internal jugular vein. A permanent ultrasound image was recorded. After creating a small venotomy incision, a 21 gauge needle was advanced into the right internal jugular vein under direct, real-time ultrasound guidance. Ultrasound image  documentation was performed. After securing guidewire access, an 8 Fr dilator was placed. A J-wire was kinked to measure appropriate catheter length. A Palindrome tunneled hemodialysis catheter measuring 19 cm from tip to cuff was chosen for placement. This was tunneled in a retrograde fashion from the chest wall to the venotomy incision. At the venotomy, serial dilatation was performed and a 15 Fr peel-away sheath was placed over a guidewire. The catheter was then placed through the sheath and the sheath removed. Final catheter positioning was confirmed and documented with a fluoroscopic spot image. The catheter was aspirated, flushed with saline, and injected with appropriate volume heparin dwells. The venotomy incision was closed with subcuticular 4-0 Vicryl. Dermabond was applied to the incision. The catheter exit site was secured with 0-Prolene retention sutures. COMPLICATIONS: None.  No pneumothorax. FINDINGS: After catheter placement, the tip lies in the right atrium. The catheter aspirates normally and is ready for immediate use. IMPRESSION: Placement of tunneled hemodialysis catheter via the right internal jugular vein. The catheter tip lies in the right atrium. The catheter is ready for immediate use. Electronically Signed   By: Aletta Edouard M.D.   On: 06/15/2021 10:23   DG CHEST PORT 1 VIEW  Result Date: 05/22/2021 CLINICAL DATA:  Shortness of breath, weakness. EXAM: PORTABLE CHEST 1 VIEW COMPARISON:  04/25/2021. FINDINGS: The heart enlarged and mediastinal contours are within normal limits. Atherosclerotic calcification of the aorta is noted. Hazy opacity is seen at the left lung base. The right lung is clear. No pneumothorax. IMPRESSION: 1. Hazy opacity at the left lung base, which may be artifactual due to patient's body habitus. A small pleural effusion with atelectasis or infiltrate can not be excluded. 2. Cardiomegaly. Electronically Signed   By: Brett Fairy M.D.   On: 05/22/2021 01:14    VAS [place] UPPER EXT VEIN MAPPING (PRE-OP AVF)  Result Date: 06/17/2021 UPPER EXTREMITY VEIN MAPPING Patient Name:  AARON BOEH  Date of Exam:   06/17/2021 Medical Rec #: [id number]      Accession #:    [id number] Date of Birth: 1940-09-16      Patient Gender: F Patient Age:   12 years Exam Location:  [facility] Procedure:      VAS [place] UPPER EXT VEIN MAPPING (PRE-OP AVF) Referring Phys: Lawson Radar --------------------------------------------------------------------------------  Indications: Pre-access. History: CKD.  Comparison Study: No prior study Performing Technologist: Maudry Mayhew MHA, RDMS, RVT, RDCS  Examination Guidelines: A complete evaluation includes B-mode imaging, spectral Doppler, color Doppler, and power Doppler as needed of all accessible portions of each vessel. Bilateral testing is considered an integral part of a complete examination. Limited examinations for reoccurring indications may be performed as noted. +-----------------+-------------+----------+--------------+  Right Cephalic    Diameter (cm) Depth (cm)    Findings     +-----------------+-------------+----------+--------------+  Shoulder              0.23                                 +-----------------+-------------+----------+--------------+  Prox upper arm        0.14                                 +-----------------+-------------+----------+--------------+  Mid upper arm         0.12                                 +-----------------+-------------+----------+--------------+  Dist upper arm        0.14                                 +-----------------+-------------+----------+--------------+  Antecubital fossa     0.09                                 +-----------------+-------------+----------+--------------+  Prox forearm          0.21                                 +-----------------+-------------+----------+--------------+  Mid forearm                                not visualized   +-----------------+-------------+----------+--------------+  Dist forearm                               not visualized  +-----------------+-------------+----------+--------------+  Wrist                                      not visualized  +-----------------+-------------+----------+--------------+ +-----------------+-------------+----------+--------------+  Right Basilic     Diameter (cm) Depth (cm)    Findings     +-----------------+-------------+----------+--------------+  Shoulder                                   not visualized  +-----------------+-------------+----------+--------------+  Prox upper arm                             not visualized  +-----------------+-------------+----------+--------------+  Mid upper arm                              not visualized  +-----------------+-------------+----------+--------------+  Dist upper arm                             not visualized  +-----------------+-------------+----------+--------------+  Antecubital fossa                          not visualized  +-----------------+-------------+----------+--------------+  Prox forearm  not visualized  +-----------------+-------------+----------+--------------+  Mid forearm                                not visualized  +-----------------+-------------+----------+--------------+  Distal forearm                             not visualized  +-----------------+-------------+----------+--------------+  Wrist                                      not visualized  +-----------------+-------------+----------+--------------+ +-----------------+-------------+----------+--------------+  Left Cephalic     Diameter (cm) Depth (cm)    Findings     +-----------------+-------------+----------+--------------+  Prox upper arm        0.12                                 +-----------------+-------------+----------+--------------+  Mid upper arm         0.11                                  +-----------------+-------------+----------+--------------+  Dist upper arm        0.10                                 +-----------------+-------------+----------+--------------+  Antecubital fossa     0.10                                 +-----------------+-------------+----------+--------------+  Prox forearm                               not visualized  +-----------------+-------------+----------+--------------+  Mid forearm                                not visualized  +-----------------+-------------+----------+--------------+  Dist forearm                               not visualized  +-----------------+-------------+----------+--------------+  Wrist                                      not visualized  +-----------------+-------------+----------+--------------+ +-----------------+-------------+----------+---------+  Left Basilic      Diameter (cm) Depth (cm) Findings   +-----------------+-------------+----------+---------+  Prox upper arm        0.19                            +-----------------+-------------+----------+---------+  Mid upper arm         0.23                            +-----------------+-------------+----------+---------+  Dist upper arm        0.24  branching  +-----------------+-------------+----------+---------+  Antecubital fossa     0.22                            +-----------------+-------------+----------+---------+  Prox forearm          0.22                            +-----------------+-------------+----------+---------+  Mid forearm           0.16                 branching  +-----------------+-------------+----------+---------+  Distal forearm        0.22                            +-----------------+-------------+----------+---------+  Wrist                 0.13                            +-----------------+-------------+----------+---------+ *See table(s) above for measurements and observations.  Diagnosing physician: Harold Barban MD Electronically signed by  Harold Barban MD on 06/17/2021 at 7:40:53 PM.    Final     Microbiology: Results for orders placed or performed during the hospital encounter of 06/12/21  Resp Panel by RT-PCR (Flu A&B, Covid) Nasopharyngeal Swab     Status: Abnormal   Collection Time: 06/12/21  6:01 PM   Specimen: Nasopharyngeal Swab; Nasopharyngeal(NP) swabs in vial transport medium  Result Value Ref Range Status   SARS Coronavirus 2 by RT PCR NEGATIVE NEGATIVE Final    Comment: (NOTE) SARS-CoV-2 target nucleic acids are NOT DETECTED.  The SARS-CoV-2 RNA is generally detectable in upper respiratory specimens during the acute phase of infection. The lowest concentration of SARS-CoV-2 viral copies this assay can detect is 138 copies/mL. A negative result does not preclude SARS-Cov-2 infection and should not be used as the sole basis for treatment or other patient management decisions. A negative result may occur with  improper specimen collection/handling, submission of specimen other than nasopharyngeal swab, presence of viral mutation(s) within the areas targeted by this assay, and inadequate number of viral copies(<138 copies/mL). A negative result must be combined with clinical observations, patient history, and epidemiological information. The expected result is Negative.  Fact Sheet for Patients:  EntrepreneurPulse.com.au  Fact Sheet for Healthcare Providers:  IncredibleEmployment.be  This test is no t yet approved or cleared by the [place] FDA and  has been authorized for detection and/or diagnosis of SARS-CoV-2 by FDA under an Emergency Use Authorization (EUA). This EUA will remain  in effect (meaning this test can be used) for the duration of the COVID-19 declaration under Section 564(b)(1) of the Act, 21 U.S.C.section 360bbb-3(b)(1), unless the authorization is terminated  or revoked sooner.       Influenza A by PCR POSITIVE (A) NEGATIVE Final   Influenza B  by PCR NEGATIVE NEGATIVE Final    Comment: (NOTE) The Xpert Xpress SARS-CoV-2/FLU/RSV plus assay is intended as an aid in the diagnosis of influenza from Nasopharyngeal swab specimens and should not be used as a sole basis for treatment. Nasal washings and aspirates are unacceptable for Xpert Xpress SARS-CoV-2/FLU/RSV testing.  Fact Sheet for Patients: EntrepreneurPulse.com.au  Fact Sheet for Healthcare Providers: IncredibleEmployment.be  This test is not yet approved or cleared by the  [place] Architectural technologist and has been authorized for detection and/or diagnosis of SARS-CoV-2 by FDA under an Print production planner (EUA). This EUA will remain in effect (meaning this test can be used) for the duration of the COVID-19 declaration under Section 564(b)(1) of the Act, 21 U.S.C. section 360bbb-3(b)(1), unless the authorization is terminated or revoked.  Performed at [facility], [place] [address] [place], [place] [address]   Urine Culture     Status: Abnormal   Collection Time: 06/13/21  2:25 PM   Specimen: Urine, Clean Catch  Result Value Ref Range Status   Specimen Description URINE, CLEAN CATCH  Final   Special Requests   Final    NONE Performed at [facility], [place] [address] [place], [place] [address]    Culture MULTIPLE SPECIES PRESENT, SUGGEST RECOLLECTION (A)  Final   Report Status 06/14/2021 FINAL  Final    Labs: CBC: Recent Labs  Lab 06/14/21 0146 06/15/21 1734 06/16/21 0108 06/17/21 0800 06/18/21 1029 06/19/21 0922  WBC 4.1 5.0 5.8 5.4  --  6.7  NEUTROABS 2.3  --   --   --   --   --   HGB 8.4* 8.6* 8.8* 8.7* 10.2* 8.7*  HCT 26.5* 26.7* 28.6* 27.0* 30.0* 28.1*  MCV 93.0 92.7 94.4 93.1  --  95.6  PLT 146* 150 125* 139*  --  224*   Basic Metabolic Panel: Recent Labs  Lab 06/13/21 0024 06/14/21 0146 06/15/21 0551 06/16/21 0108 06/17/21 0800 06/18/21 1029 06/19/21 0922  NA 140 139 140 140 139 139 138  K  3.7 3.5 3.0* 3.5 3.6 3.3* 3.1*  CL 107 106 105 103 103 102 101  CO2 20* 20* 22 23 20*  --  25  GLUCOSE 147* 136* 79 123* 214* 155* 145*  BUN 52* 51* 50* 34* 37* 24* 28*  CREATININE 6.51* 6.37* 6.15* 4.80* 4.78* 3.50* 3.72*  CALCIUM 8.4* 8.6* 8.4* 8.8* 8.6*  --  8.6*  PHOS 6.3*  --   --  4.2 4.9*  --  3.3   Liver Function Tests: Recent Labs  Lab 06/16/21 0108 06/17/21 0800 06/19/21 0922  ALBUMIN 3.2* 3.1* 3.0*   CBG: Recent Labs  Lab 06/17/21 1543 06/17/21 2007 06/18/21 0749 06/18/21 1548 06/19/21 0814  GLUCAP 176* 217* 149* 140* 141*    Discharge time spent: greater than 30 minutes.  Signed: Debbe Odea, MD [facility] 06/19/2021

## 2021-06-19 NOTE — Progress Notes (Signed)
Contacted [employer] and spoke to Elberta Fortis this am. Pt to start at clinic on Tuesday. Pt will need to arrive at 10:00-10:15 to complete paperwork prior to treatment. Spoke to pt's dtr, [name], via phone to advise her that pt will need to start at clinic on Tuesday and pt will need to arrive between 10:00-10:15. This information as well as clinic and schedule information text to pt's dtr per her request. Dtr voices understanding. HD arrangements added to pt's AVS as well. Met with pt at bedside to discuss out-pt arrangements and provided schedule letter. Pt agreeable to plan and aware that dtr advised of this info as well. Contacted renal PA regarding clinic's need for orders. Contacted renal MD who indicates that pt will not require HD tomorrow since pt had HD today. MD agreeable to d/c today and pt to start at clinic on Tuesday. Update provided to attending and [facility] staff.   Melven Sartorius Renal Navigator [phone]

## 2021-06-19 NOTE — Progress Notes (Signed)
PT Cancellation Note  Patient Details Name: Chanler Schreiter MRN: [id number] DOB: [birth year]   Cancelled Treatment:    Reason Eval/Treat Not Completed: Patient at procedure or test/unavailable. Patient off floor for HD this am. Will re-attempt later today if possible.     Zykia Walla 06/19/2021, 9:10 AM

## 2021-06-19 NOTE — Progress Notes (Addendum)
°  Progress Note    06/19/2021 8:11 AM 1 Day Post-Op  Subjective:  no complaints   Vitals:   06/18/21 2302 06/19/21 0403  BP: 130/71 108/88  Pulse: 81 82  Resp: 11 16  Temp: 98.6 F (37 C) 98.5 F (36.9 C)  SpO2: 95% 96%   Physical Exam: Cardiac:  regular Lungs:  non labored Incisions:  left AC incision is c/d/I without swelling or hematoma Extremities:  well perfused and warm with palpable radial pusle. Hand warm and well perfused 5/5 grip strength Neurologic: alert and oriented  CBC    Component Value Date/Time   WBC 5.4 06/17/2021 0800   RBC 2.90 (L) 06/17/2021 0800   HGB 10.2 (L) 06/18/2021 1029   HGB 9.3 (L) 01/13/2021 1143   HCT 30.0 (L) 06/18/2021 1029   HCT 28.5 (L) 01/13/2021 1143   PLT 139 (L) 06/17/2021 0800   PLT 165 01/13/2021 1143   MCV 93.1 06/17/2021 0800   MCV 87 01/13/2021 1143   MCH 30.0 06/17/2021 0800   MCHC 32.2 06/17/2021 0800   RDW 16.0 (H) 06/17/2021 0800   RDW 14.2 01/13/2021 1143   LYMPHSABS 0.9 06/14/2021 0146   MONOABS 0.6 06/14/2021 0146   EOSABS 0.2 06/14/2021 0146   BASOSABS 0.0 06/14/2021 0146    BMET    Component Value Date/Time   NA 139 06/18/2021 1029   NA 140 05/20/2021 1159   K 3.3 (L) 06/18/2021 1029   CL 102 06/18/2021 1029   CO2 20 (L) 06/17/2021 0800   GLUCOSE 155 (H) 06/18/2021 1029   BUN 24 (H) 06/18/2021 1029   BUN 37 (H) 05/20/2021 1159   CREATININE 3.50 (H) 06/18/2021 1029   CALCIUM 8.6 (L) 06/17/2021 0800   GFRNONAA 9 (L) 06/17/2021 0800   GFRAA 38 (L) 08/09/2019 1104    INR    Component Value Date/Time   INR 1.3 (H) 04/24/2021 1821     Intake/Output Summary (Last 24 hours) at 06/19/2021 6606 Last data filed at 06/18/2021 1500 Gross per 24 hour  Intake 8.5 ml  Output --  Net 8.5 ml     Assessment/Plan:  81 y.o. female is s/p left Basilic Vein fistula creation 1 Day Post-Op   Left AC incision is c/d/I without swelling or hematoma Fistula with good thrill No signs or symptoms of  steal Elevate LUE prn She will have follow up arranged in our office in 4-6 weeks with fistula duplex  Karoline Caldwell, PA-C [facility] [phone] 06/19/2021 8:11 AM  I have independently interviewed and examined patient and agree with PA assessment and plan above.   Sheyla Zaffino C. Donzetta Matters, MD [facility] [place] Office: [phone] Pager: [phone]

## 2021-06-19 NOTE — Progress Notes (Signed)
Pt's daughter Jeanene Erb is on her way to p/u pt and should be here in the next 20 min.

## 2021-06-19 NOTE — Progress Notes (Signed)
OT Cancellation Note  Patient Details Name: Paetyn Pietrzak MRN: [id number] DOB: 1940-05-22   Cancelled Treatment:    Reason Eval/Treat Not Completed: Patient at procedure or test/ unavailable.  Pt. Currently at HD.  Will check back as schedule permits.    Tanya Nones 06/19/2021, 11:35 AM

## 2021-06-19 NOTE — Progress Notes (Signed)
Pt being d/c.  PIVs removed Discharge information given to pt and daughter Mrs. [name] Frazier Pt verb understanding and had no further questions Pt d/c via wheelchair

## 2021-06-20 ENCOUNTER — Telehealth: Payer: Self-pay | Admitting: Physician Assistant

## 2021-06-20 NOTE — Telephone Encounter (Signed)
Transition of care contact from inpatient facility  Date of Discharge: 06/19/21 Date of Contact: 06/20/21 Method of contact: Phone  Attempted to contact patient to discuss transition of care from inpatient admission. Patient did not answer the phone. Message was left on the patient's voicemail with call back instructions.  Anice Paganini, PA-C 06/20/2021, 11:19 AM  [facility] Pager: [phone]

## 2021-06-20 NOTE — Progress Notes (Signed)
CSW spoke with patient's daughter, Lynelle Smoke, and provided contact info for her to call [facility] to schedule patient's dialysis transportation. She expressed understanding.  Gilmore Laroche, MSW, [facility]

## 2021-06-23 LAB — PATHOLOGIST SMEAR REVIEW

## 2021-07-07 ENCOUNTER — Other Ambulatory Visit: Payer: Self-pay | Admitting: Cardiology

## 2021-07-07 DIAGNOSIS — I10 Essential (primary) hypertension: Secondary | ICD-10-CM

## 2021-07-16 ENCOUNTER — Other Ambulatory Visit: Payer: Self-pay | Admitting: *Deleted

## 2021-07-29 ENCOUNTER — Other Ambulatory Visit: Payer: Self-pay | Admitting: Student

## 2021-07-29 ENCOUNTER — Encounter: Payer: Self-pay | Admitting: Vascular Surgery

## 2021-07-29 ENCOUNTER — Ambulatory Visit (INDEPENDENT_AMBULATORY_CARE_PROVIDER_SITE_OTHER): Payer: Medicare HMO | Admitting: Vascular Surgery

## 2021-07-29 ENCOUNTER — Ambulatory Visit (HOSPITAL_COMMUNITY)
Admission: RE | Admit: 2021-07-29 | Discharge: 2021-07-29 | Disposition: A | Discharge status: Home / Self Care | Payer: Medicare HMO | Source: Ambulatory Visit | Attending: Vascular Surgery | Admitting: Vascular Surgery

## 2021-07-29 VITALS — BP 129/80 | HR 76 | Temp 96.6°F | Resp 20 | Ht 64.0 in | Wt 167.0 lb

## 2021-07-29 DIAGNOSIS — N185 Chronic kidney disease, stage 5: Secondary | ICD-10-CM | POA: Diagnosis present

## 2021-07-29 DIAGNOSIS — I480 Paroxysmal atrial fibrillation: Secondary | ICD-10-CM

## 2021-07-29 DIAGNOSIS — N186 End stage renal disease: Secondary | ICD-10-CM

## 2021-07-29 NOTE — H&P (View-Only) (Signed)
? ?Patient ID: [name], female   DOB: 09/22/1940, 80 y.o.   MRN: [id number] ? ?Reason for Consult: Routine Post Op ?  ?Referred by Vanstory, Ashley N, PA ? ?Subjective:  ?   ?HPI: ? ?[name] is a 81 y.o. female recent left arm basilic vein fistula creation.  She is now here for follow-up.  She has done very well since discharge from the hospital dialyzing via tunneled dialysis catheter on Tuesday Thursday Saturdays.  She does not take blood thinners. ? ?Past Medical History:  ?Diagnosis Date  ? Diabetes mellitus without complication (HCC)   ? Dyspnea   ? with exertion  ? Hypertension   ? Neuromuscular disorder (HCC)   ? neuropathy bil feet  ? Pneumonia   ? ?Family History  ?Family history unknown: Yes  ? ?Past Surgical History:  ?Procedure Laterality Date  ? AV FISTULA PLACEMENT Left 06/18/2021  ? Procedure: LEFT ARM ARTERIOVENOUS (AV) FISTULA CREATION;  Surgeon: ,  Christopher, MD;  Location: [facility] OR;  Service: Vascular;  Laterality: Left;  ? CARDIOVERSION N/A 01/14/2021  ? Procedure: CARDIOVERSION;  Surgeon: Ganji, Jay, MD;  Location: [facility] ENDOSCOPY;  Service: Cardiovascular;  Laterality: N/A;  ? IR FLUORO GUIDE CV LINE RIGHT  06/15/2021  ? IR US GUIDE VASC ACCESS RIGHT  06/15/2021  ? TONSILLECTOMY    ? ? ?Short Social History:  ?Social History  ? ?Tobacco Use  ? Smoking status: Never  ? Smokeless tobacco: Never  ?Substance Use Topics  ? Alcohol use: No  ? ? ?Allergies  ?Allergen Reactions  ? Amlodipine Other (See Comments)  ?  Patient has LE edema to this med at higher doses (10 mg)  ? ? ?Current Outpatient Medications  ?Medication Sig Dispense Refill  ? Accu-Chek FastClix Lancets MISC See admin instructions.    ? acetaminophen (TYLENOL) 500 MG tablet Take 500 mg by mouth every 6 (six) hours as needed for mild pain, fever or headache.    ? BD PEN NEEDLE NANO 2ND GEN 32G X 4 MM MISC 3 (three) times daily. as directed    ? Blood Glucose Monitoring Suppl (ACCU-CHEK GUIDE ME) w/Device KIT See admin  instructions.    ? diltiazem (CARDIZEM CD) 180 MG 24 hr capsule TAKE 1 CAPSULE(180 MG) BY MOUTH DAILY 90 capsule 0  ? HUMULIN 70/30 KWIKPEN (70-30) 100 UNIT/ML KwikPen Inject 5 Units into the skin daily. 15 mL 1  ? HYDROcodone-acetaminophen (NORCO/VICODIN) 5-325 MG tablet Take 1 tablet by mouth every 6 (six) hours as needed for moderate pain. 10 tablet 0  ? Menthol-Methyl Salicylate (MUSCLE RUB EX) Apply 1 application topically daily as needed (leg cramps).    ? pravastatin (PRAVACHOL) 20 MG tablet Take 1 tablet (20 mg total) by mouth daily. 90 tablet 3  ? ?No current facility-administered medications for this visit.  ? ? ?Review of Systems  ?Constitutional:  Constitutional negative. ?HENT: HENT negative.  ?Eyes: Eyes negative.  ?Respiratory: Respiratory negative.  ?Cardiovascular: Cardiovascular negative.  ?GI: Gastrointestinal negative.  ?Musculoskeletal: Musculoskeletal negative.  ?Skin: Skin negative.  ?Neurological: Neurological negative. ?Hematologic: Hematologic/lymphatic negative.  ?Psychiatric: Psychiatric negative.   ? ?   ?Objective:  ?Objective  ? ?Vitals:  ? 07/29/21 1416  ?BP: 129/80  ?Pulse: 76  ?Resp: 20  ?Temp: (!) 96.6 ?F (35.9 ?C)  ?SpO2: 94%  ?Weight: 167 lb (75.8 kg)  ?Height: 5' 4" (1.626 m)  ? ?Body mass index is 28.67 kg/m?. ? ?Physical Exam ?HENT:  ?   Head: Normocephalic.  ?     Nose: Nose normal.  ?Eyes:  ?   Pupils: Pupils are equal, round, and reactive to light.  ?Cardiovascular:  ?   Pulses:     ?     Radial pulses are 2+ on the right side and 2+ on the left side.  ?Pulmonary:  ?   Effort: Pulmonary effort is normal.  ?Abdominal:  ?   General: Abdomen is flat.  ?   Palpations: Abdomen is soft.  ?Musculoskeletal:     ?   General: Normal range of motion.  ?   Right lower leg: No edema.  ?   Left lower leg: No edema.  ?   Comments: Strong left arm avf thrill  ?Skin: ?   General: Skin is warm.  ?   Capillary Refill: Capillary refill takes less than 2 seconds.  ?Neurological:  ?   General: No  focal deficit present.  ?   Mental Status: She is alert.  ?Psychiatric:     ?   Mood and Affect: Mood normal.     ?   Thought Content: Thought content normal.  ? ? ?Data: ?AVF                 PSV (cm/s)Flow Vol (mL/min)Comments  ?+--------------------+----------+-----------------+--------+  ?Native artery inflow   149           520                 ?+--------------------+----------+-----------------+--------+  ?AVF Anastomosis        405                               ?+--------------------+----------+-----------------+--------+  ? ?   ?+------------+----------+------------+----------+--------------------------  ?----+  ?OUTFLOW VEINPSV (cm/s)  Diameter  Depth (cm)           Describe        ?      ?                          (cm)                                          ?      ?+------------+----------+------------+----------+--------------------------  ?----+  ?Prox UA         83        0.74       0.99                               ?      ?+------------+----------+------------+----------+--------------------------  ?----+  ?Mid UA          93        0.74       0.78                               ?      ?+------------+----------+------------+----------+--------------------------  ?----+  ?Dist UA        487        0.61       0.59       change in Diameter  ?and      ?  stenotic        ?      ?+------------+----------+------------+----------+--------------------------  ?----+  ?AC Fossa       339        0.35       0.21                               ?      ?+------------+----------+------------+----------+--------------------------  ?----+  ?Distal upper arm segment demonstrates elevated velocities with reduced  ?diameter consistent with stenosis.  ?     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  ?    ?Assessment/Plan:  ?  ?81 year old female currently dialyzing via tunneled dialysis  catheter.  Plan is for left basilic vein transposition possibly with revision due to change in diameter in the distal upper arm this will be addressed at the time of surgery.  We will get this scheduled on a nondialysis day in the near future.  I discussed the risk benefits alternatives he demonstrates good understanding. ? ?  ? ?[name] MD ?[facility] ? ? ?

## 2021-07-29 NOTE — Progress Notes (Signed)
? ?Patient ID: [name], female   DOB: 03/13/1941, 80 y.o.   MRN: [id number] ? ?Reason for Consult: Routine Post Op ?  ?Referred by Vanstory, Ashley N, PA ? ?Subjective:  ?   ?HPI: ? ?[name] is a 80 y.o. female recent left arm basilic vein fistula creation.  She is now here for follow-up.  She has done very well since discharge from the hospital dialyzing via tunneled dialysis catheter on Tuesday Thursday Saturdays.  She does not take blood thinners. ? ?Past Medical History:  ?Diagnosis Date  ? Diabetes mellitus without complication (HCC)   ? Dyspnea   ? with exertion  ? Hypertension   ? Neuromuscular disorder (HCC)   ? neuropathy bil feet  ? Pneumonia   ? ?Family History  ?Family history unknown: Yes  ? ?Past Surgical History:  ?Procedure Laterality Date  ? AV FISTULA PLACEMENT Left 06/18/2021  ? Procedure: LEFT ARM ARTERIOVENOUS (AV) FISTULA CREATION;  Surgeon: Malissia Rabbani Christopher, MD;  Location: [facility] OR;  Service: Vascular;  Laterality: Left;  ? CARDIOVERSION N/A 01/14/2021  ? Procedure: CARDIOVERSION;  Surgeon: Ganji, Jay, MD;  Location: [facility] ENDOS[facility];  Service: Cardiovascular;  Laterality: N/A;  ? IR FLUORO GUIDE CV LINE RIGHT  06/15/2021  ? IR US GUIDE VASC ACCESS RIGHT  06/15/2021  ? TONSILLECTOMY    ? ? ?Short Social History:  ?Social History  ? ?Tobacco Use  ? Smoking status: Never  ? Smokeless tobacco: Never  ?Substance Use Topics  ? Alcohol use: No  ? ? ?Allergies  ?Allergen Reactions  ? Amlodipine Other (See Comments)  ?  Patient has LE edema to this med at higher doses (10 mg)  ? ? ?Current Outpatient Medications  ?Medication Sig Dispense Refill  ? Accu-Chek FastClix Lancets MISC See admin instructions.    ? acetaminophen (TYLENOL) 500 MG tablet Take 500 mg by mouth every 6 (six) hours as needed for mild pain, fever or headache.    ? BD PEN NEEDLE NANO 2ND GEN 32G X 4 MM MISC 3 (three) times daily. as directed    ? Blood Glucose Monitoring Suppl (ACCU-CHEK GUIDE ME) w/Device KIT See admin  instructions.    ? diltiazem (CARDIZEM CD) 180 MG 24 hr capsule TAKE 1 CAPSULE(180 MG) BY MOUTH DAILY 90 capsule 0  ? HUMULIN 70/30 KWIKPEN (70-30) 100 UNIT/ML KwikPen Inject 5 Units into the skin daily. 15 mL 1  ? HYDROcodone-acetaminophen (NORCO/VICODIN) 5-325 MG tablet Take 1 tablet by mouth every 6 (six) hours as needed for moderate pain. 10 tablet 0  ? Menthol-Methyl Salicylate (MUSCLE RUB EX) Apply 1 application topically daily as needed (leg cramps).    ? pravastatin (PRAVACHOL) 20 MG tablet Take 1 tablet (20 mg total) by mouth daily. 90 tablet 3  ? ?No current facility-administered medications for this visit.  ? ? ?Review of Systems  ?Constitutional:  Constitutional negative. ?HENT: HENT negative.  ?Eyes: Eyes negative.  ?Respiratory: Respiratory negative.  ?Cardiovascular: Cardiovascular negative.  ?GI: Gastrointestinal negative.  ?Musculoskeletal: Musculoskeletal negative.  ?Skin: Skin negative.  ?Neurological: Neurological negative. ?Hematologic: Hematologic/lymphatic negative.  ?Psychiatric: Psychiatric negative.   ? ?   ?Objective:  ?Objective  ? ?Vitals:  ? 07/29/21 1416  ?BP: 129/80  ?Pulse: 76  ?Resp: 20  ?Temp: (!) 96.6 ?F (35.9 ?C)  ?SpO2: 94%  ?Weight: 167 lb (75.8 kg)  ?Height: 5' 4" (1.626 m)  ? ?Body mass index is 28.67 kg/m?. ? ?Physical Exam ?HENT:  ?   Head: Normocephalic.  ?     Nose: Nose normal.  ?Eyes:  ?   Pupils: Pupils are equal, round, and reactive to light.  ?Cardiovascular:  ?   Pulses:     ?     Radial pulses are 2+ on the right side and 2+ on the left side.  ?Pulmonary:  ?   Effort: Pulmonary effort is normal.  ?Abdominal:  ?   General: Abdomen is flat.  ?   Palpations: Abdomen is soft.  ?Musculoskeletal:     ?   General: Normal range of motion.  ?   Right lower leg: No edema.  ?   Left lower leg: No edema.  ?   Comments: Strong left arm avf thrill  ?Skin: ?   General: Skin is warm.  ?   Capillary Refill: Capillary refill takes less than 2 seconds.  ?Neurological:  ?   General: No  focal deficit present.  ?   Mental Status: She is alert.  ?Psychiatric:     ?   Mood and Affect: Mood normal.     ?   Thought Content: Thought content normal.  ? ? ?Data: ?AVF                 PSV (cm/s)Flow Vol (mL/min)Comments  ?+--------------------+----------+-----------------+--------+  ?Native artery inflow   149           520                 ?+--------------------+----------+-----------------+--------+  ?AVF Anastomosis        405                               ?+--------------------+----------+-----------------+--------+  ? ?   ?+------------+----------+------------+----------+--------------------------  ?----+  ?OUTFLOW VEINPSV (cm/s)  Diameter  Depth (cm)           Describe        ?      ?                          (cm)                                          ?      ?+------------+----------+------------+----------+--------------------------  ?----+  ?Prox UA         83        0.74       0.99                               ?      ?+------------+----------+------------+----------+--------------------------  ?----+  ?Mid UA          93        0.74       0.78                               ?      ?+------------+----------+------------+----------+--------------------------  ?----+  ?Dist UA        487        0.61       0.59       change in Diameter  ?and      ?                                                         stenotic        ?      ?+------------+----------+------------+----------+--------------------------  ?----+  ?AC Fossa       339        0.35       0.21                               ?      ?+------------+----------+------------+----------+--------------------------  ?----+  ?Distal upper arm segment demonstrates elevated velocities with reduced  ?diameter consistent with stenosis.  ?     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  ?    ?Assessment/Plan:  ?  ?80-year-old female currently dialyzing via tunneled dialysis  catheter.  Plan is for left basilic vein transposition possibly with revision due to change in diameter in the distal upper arm this will be addressed at the time of surgery.  We will get this scheduled on a nondialysis day in the near future.  I discussed the risk benefits alternatives he demonstrates good understanding. ? ?  ? ?Kerrigan Glendening Christopher Elon Eoff MD ?[facility] [place] ? ? ?

## 2021-08-04 ENCOUNTER — Emergency Department (HOSPITAL_COMMUNITY): Payer: Medicare HMO

## 2021-08-04 ENCOUNTER — Encounter (HOSPITAL_COMMUNITY): Payer: Self-pay

## 2021-08-04 ENCOUNTER — Other Ambulatory Visit: Payer: Self-pay

## 2021-08-04 ENCOUNTER — Emergency Department (HOSPITAL_COMMUNITY)
Admission: EM | Admit: 2021-08-04 | Discharge: 2021-08-04 | Disposition: A | Discharge status: Home / Self Care | Payer: Medicare HMO | Attending: Emergency Medicine | Admitting: Emergency Medicine

## 2021-08-04 DIAGNOSIS — E162 Hypoglycemia, unspecified: Secondary | ICD-10-CM | POA: Insufficient documentation

## 2021-08-04 DIAGNOSIS — Z794 Long term (current) use of insulin: Secondary | ICD-10-CM | POA: Diagnosis not present

## 2021-08-04 DIAGNOSIS — E876 Hypokalemia: Secondary | ICD-10-CM | POA: Insufficient documentation

## 2021-08-04 DIAGNOSIS — R059 Cough, unspecified: Secondary | ICD-10-CM | POA: Diagnosis not present

## 2021-08-04 LAB — CBC WITH DIFFERENTIAL/PLATELET
Abs Immature Granulocytes: 0.03 10*3/uL (ref 0.00–0.07)
Basophils Absolute: 0.1 10*3/uL (ref 0.0–0.1)
Basophils Relative: 1 %
Eosinophils Absolute: 0.1 10*3/uL (ref 0.0–0.5)
Eosinophils Relative: 2 %
HCT: 34.9 % — ABNORMAL LOW (ref 36.0–46.0)
Hemoglobin: 11 g/dL — ABNORMAL LOW (ref 12.0–15.0)
Immature Granulocytes: 1 %
Lymphocytes Relative: 20 %
Lymphs Abs: 0.8 10*3/uL (ref 0.7–4.0)
MCH: 29.8 pg (ref 26.0–34.0)
MCHC: 31.5 g/dL (ref 30.0–36.0)
MCV: 94.6 fL (ref 80.0–100.0)
Monocytes Absolute: 0.5 10*3/uL (ref 0.1–1.0)
Monocytes Relative: 12 %
Neutro Abs: 2.7 10*3/uL (ref 1.7–7.7)
Neutrophils Relative %: 64 %
Platelets: 74 10*3/uL — ABNORMAL LOW (ref 150–400)
RBC: 3.69 MIL/uL — ABNORMAL LOW (ref 3.87–5.11)
RDW: 16.7 % — ABNORMAL HIGH (ref 11.5–15.5)
WBC: 4.3 10*3/uL (ref 4.0–10.5)
nRBC: 0 % (ref 0.0–0.2)

## 2021-08-04 LAB — BASIC METABOLIC PANEL
Anion gap: 9 (ref 5–15)
BUN: 22 mg/dL (ref 8–23)
CO2: 29 mmol/L (ref 22–32)
Calcium: 8.8 mg/dL — ABNORMAL LOW (ref 8.9–10.3)
Chloride: 102 mmol/L (ref 98–111)
Creatinine, Ser: 2.64 mg/dL — ABNORMAL HIGH (ref 0.44–1.00)
GFR, Estimated: 18 mL/min — ABNORMAL LOW (ref >60)
Glucose, Bld: 74 mg/dL (ref 70–99)
Potassium: 2.7 mmol/L — CL (ref 3.5–5.1)
Sodium: 140 mmol/L (ref 135–145)

## 2021-08-04 LAB — CBG MONITORING, ED
Glucose-Capillary: 96 mg/dL (ref 70–99)
Glucose-Capillary: 151 mg/dL — ABNORMAL HIGH (ref 70–99)
Glucose-Capillary: 40 mg/dL — CL (ref 70–99)
Glucose-Capillary: 84 mg/dL (ref 70–99)
Glucose-Capillary: 133 mg/dL — ABNORMAL HIGH (ref 70–99)

## 2021-08-04 LAB — MAGNESIUM: Magnesium: 1.8 mg/dL (ref 1.7–2.4)

## 2021-08-04 MED ORDER — DEXTROSE 50 % IV SOLN
1.0000 | Freq: Once | INTRAVENOUS | Status: AC
  Administered 2021-08-04: 50 mL via INTRAVENOUS
  Filled 2021-08-04: qty 50

## 2021-08-04 MED ORDER — POTASSIUM CHLORIDE CRYS ER 20 MEQ PO TBCR
40.0000 meq | EXTENDED_RELEASE_TABLET | Freq: Once | ORAL | Status: AC
Start: 2021-08-04 — End: 2021-08-04
  Administered 2021-08-04: 40 meq via ORAL
  Filled 2021-08-04: qty 2

## 2021-08-04 NOTE — ED Notes (Signed)
Pt's daughter states, pt doesn't urinate all of the time, only sometimes. ?

## 2021-08-04 NOTE — ED Provider Notes (Signed)
?  Physical Exam  ?BP 124/78   Pulse 92   Temp 97.8 ?F (36.6 ?C) (Oral)   Resp 13   SpO2 98%  ? ?Physical Exam ? ?Procedures  ?Procedures ? ?ED Course / MDM  ?  ?Medical Decision Making ?Amount and/or Complexity of Data Reviewed ?Radiology: ordered. ? ?Risk ?Prescription drug management. ? ? ?Received care of patient from Dr. Betsey Holiday. Presented with hypoglycemia. Received D50x2. Observed during my care, stable glucose, up to 133.  She is stable for discharge, dialysis later today or tomorrow.  ? ? ? ? ?  ?Gareth Morgan, MD ?08/06/21 [phone] ? ?

## 2021-08-04 NOTE — ED Provider Triage Note (Addendum)
Emergency Medicine Provider Triage Evaluation Note ? ?[name] , a 81 y.o. female  was evaluated in triage.  Pt complains of "seizure".  Upon EMS arrival hands were trembling, sugar was 46.  Given D10 with EMS and now reports feeling better.  Repeat CBG in triage 96.  ESRD on HD (schedule Tue/Thur/Sat) ? ?Review of Systems  ?Positive: hypoglycemia ?Negative: fever ? ?Physical Exam  ?BP 139/77 (BP Location: Right Arm)   Pulse 83   Temp 97.6 ?F (36.4 ?C) (Oral)   Resp 17   SpO2 100%  ? ?Gen:   Awake, no distress   ?Resp:  Normal effort  ?MSK:   Moves extremities without difficulty  ?Other:   ? ?Medical Decision Making  ?Medically screening exam initiated at 4:47 AM.  Appropriate orders placed.  Tajha Sammarco was informed that the remainder of the evaluation will be completed by another provider, this initial triage assessment does not replace that evaluation, and the importance of remaining in the ED until their evaluation is complete. ? ?Hypoglycemia.  Repeat cbg in triage 96.  As she is ESRD on HD, will repeat labs. ? ?5:50 AM ?Notified by lab that K+ low at 2.7.  Will add on magnesium level.  Repeat CBG also dropped to 40.  Given amp of D50. ? ?Will upgrade to acuity 2 and prioritize room assignment. ?  ?Larene Pickett, PA-C ?08/04/21 5374 ? ?  ?Larene Pickett, PA-C ?08/04/21 8270 ? ?  ?Larene Pickett, PA-C ?08/04/21 [phone] ? ?

## 2021-08-04 NOTE — ED Triage Notes (Addendum)
BIB [facility] after pt family called to report pt having "seizure" like activity with hand tremors. Per EMS, pt CBG 48 on scene and given 25 g  of D10. Pt A & O x 4 with EMS, reports no post-ictal activity. CBG en route 227. Pt has Left chest port- started dialysis about a month ago CBG at bedside 96. PA Sanders aware. ?

## 2021-08-04 NOTE — Progress Notes (Addendum)
Inpatient Diabetes Program Recommendations ? ?AACE/ADA: New Consensus Statement on Inpatient Glycemic Control (2015) ? ?Target Ranges:  Prepandial:   less than 140 mg/dL ?     Peak postprandial:   less than 180 mg/dL (1-2 hours) ?     Critically ill patients:  140 - 180 mg/dL  ? ?Lab Results  ?Component Value Date  ? GLUCAP 84 08/04/2021  ? HGBA1C 9.6 (H) 04/25/2021  ? ? ?Review of Glycemic Control ? ?Diabetes history: DM2 ?Outpatient Diabetes medications: Humulin 70/30 15 units QHS ?Current orders for Inpatient glycemic control: None ? ?Inpatient Diabetes Program Recommendations:   ? ?Please consider discharging on Humulin 70/30 5 units QHS ? ?Spoke with patient and daughter at bedside.  Patient confirms above home medication.  She has her insulin pen with her and shows this DM, RN.  Daughter states her granddaughter checked her BG last night and it was around 150 mg/dL.  She woke up this morning trembling and her CBG was 56 mg/dL.  She ate a normal dinner last night.  She was inpatient recently in February.  She was discharged on Humulin 70/30 5 units qd; however, she has continued to administer 15 units QHS. She states she was not aware she was supposed to decrease her dose.    ? ?She checks her CBGs BID and her BG is usually around 150's.  Last A1C was 9.6% on 04/25/21.  Goal for her age per ADA standards is 7-8%.   ? ?Encouraged her to limit CHO's.  She only drinks water.   ? ? ?Will continue to follow while inpatient. ? ?Thank you, ?Reche Dixon, MSN, RN ?Diabetes Coordinator ?Inpatient Diabetes Program ?[phone] (team pager from 8a-5p) ? ? ?

## 2021-08-04 NOTE — ED Provider Notes (Signed)
?[facility] ?Provider Note ? ? ?CSN: [id number] ?Arrival date & time: 08/04/21  0440 ? ?  ? ?History ? ?Chief Complaint  ?Patient presents with  ? Hypoglycemia  ? ? ?Zhanae Proffit is a 81 y.o. female. ? ?Family called EMS because patient was experiencing shaking and tremors.  EMS found her to be hypoglycemic.  She was given IV dextrose during transport and blood sugar improved.  Patient monitored in triage and had a second episode of hypoglycemia, treated with D50. ? ?Patient reports that she has had a chronic nonproductive cough for about a month.  Otherwise no recent illness.  No current complaints. ? ? ?  ? ?Home Medications ?Prior to Admission medications   ?Medication Sig Start Date End Date Taking? Authorizing Provider  ?acetaminophen (TYLENOL) 500 MG tablet Take 500 mg by mouth every 6 (six) hours as needed for mild pain, fever or headache.   Yes [provider]  ?HUMULIN 70/30 KWIKPEN (70-30) 100 UNIT/ML KwikPen Inject 5 Units into the skin daily. ?Patient taking differently: Inject 0-15 Units into the skin at bedtime. 05/28/21  Yes Regalado, Belkys A, MD  ?Menthol-Methyl Salicylate (MUSCLE RUB EX) Apply 1 application topically daily as needed (leg cramps).   Yes [provider]  ?OVER THE COUNTER MEDICATION Apply 1 application. topically See admin instructions. Cream for itching- apply to back 3-4 times daily   Yes [provider]  ?pravastatin (PRAVACHOL) 20 MG tablet Take 1 tablet (20 mg total) by mouth daily. 05/12/21  Yes Cantwell, Celeste C, PA-C  ?sevelamer carbonate (RENVELA) 800 MG tablet Take 800 mg by mouth 3 (three) times daily with meals.   Yes [provider]  ?Accu-Chek FastClix Lancets MISC See admin instructions. 01/16/19   [provider]  ?BD PEN NEEDLE NANO 2ND GEN 32G X 4 MM MISC 3 (three) times daily. as directed 08/13/19   [provider]  ?Blood Glucose Monitoring Suppl (ACCU-CHEK GUIDE ME) w/Device  KIT See admin instructions. 12/11/18   [provider]  ?diltiazem (CARDIZEM CD) 180 MG 24 hr capsule TAKE 1 CAPSULE(180 MG) BY MOUTH DAILY ?Patient not taking: Reported on 08/04/2021 07/29/21   Lawerance Cruel C, PA-C  ?HYDROcodone-acetaminophen (NORCO/VICODIN) 5-325 MG tablet Take 1 tablet by mouth every 6 (six) hours as needed for moderate pain. ?Patient not taking: Reported on 08/04/2021 06/19/21   Debbe Odea, MD  ?   ? ?Allergies    ?Amlodipine   ? ?Review of Systems   ?Review of Systems  ?Respiratory:  Positive for cough.   ? ?Physical Exam ?Updated Vital Signs ?BP 139/77 (BP Location: Right Arm)   Pulse 83   Temp 97.6 ?F (36.4 ?C) (Oral)   Resp 17   SpO2 100%  ?Physical Exam ?Vitals and nursing note reviewed.  ?Constitutional:   ?   General: She is not in acute distress. ?   Appearance: She is well-developed.  ?HENT:  ?   Head: Normocephalic and atraumatic.  ?   Mouth/Throat:  ?   Mouth: Mucous membranes are moist.  ?Eyes:  ?   General: Vision grossly intact. Gaze aligned appropriately.  ?   Extraocular Movements: Extraocular movements intact.  ?   Conjunctiva/sclera: Conjunctivae normal.  ?Cardiovascular:  ?   Rate and Rhythm: Normal rate and regular rhythm.  ?   Pulses: Normal pulses.  ?   Heart sounds: Normal heart sounds, S1 normal and S2 normal. No murmur heard. ?  No friction rub. No gallop.  ?Pulmonary:  ?  Effort: Pulmonary effort is normal. No respiratory distress.  ?   Breath sounds: Normal breath sounds.  ?Abdominal:  ?   General: Bowel sounds are normal.  ?   Palpations: Abdomen is soft.  ?   Tenderness: There is no abdominal tenderness. There is no guarding or rebound.  ?   Hernia: No hernia is present.  ?Musculoskeletal:     ?   General: No swelling.  ?   Cervical back: Full passive range of motion without pain, normal range of motion and neck supple. No spinous process tenderness or muscular tenderness. Normal range of motion.  ?   Right lower leg: No edema.  ?   Left lower leg: No  edema.  ?Skin: ?   General: Skin is warm and dry.  ?   Capillary Refill: Capillary refill takes less than 2 seconds.  ?   Findings: No ecchymosis, erythema, rash or wound.  ?Neurological:  ?   General: No focal deficit present.  ?   Mental Status: She is alert and oriented to person, place, and time.  ?   GCS: GCS eye subscore is 4. GCS verbal subscore is 5. GCS motor subscore is 6.  ?   Cranial Nerves: Cranial nerves 2-12 are intact.  ?   Sensory: Sensation is intact.  ?   Motor: Motor function is intact.  ?   Coordination: Coordination is intact.  ?Psychiatric:     ?   Attention and Perception: Attention normal.     ?   Mood and Affect: Mood normal.     ?   Speech: Speech normal.     ?   Behavior: Behavior normal.  ? ? ?ED Results / Procedures / Treatments   ?Labs ?(all labs ordered are listed, but only abnormal results are displayed) ?Labs Reviewed  ?CBC WITH DIFFERENTIAL/PLATELET - Abnormal; Notable for the following components:  ?    Result Value  ? RBC 3.69 (*)   ? Hemoglobin 11.0 (*)   ? HCT 34.9 (*)   ? RDW 16.7 (*)   ? Platelets 74 (*)   ? All other components within normal limits  ?BASIC METABOLIC PANEL - Abnormal; Notable for the following components:  ? Potassium 2.7 (*)   ? Creatinine, Ser 2.64 (*)   ? Calcium 8.8 (*)   ? GFR, Estimated 18 (*)   ? All other components within normal limits  ?CBG MONITORING, ED - Abnormal; Notable for the following components:  ? Glucose-Capillary 40 (*)   ? All other components within normal limits  ?CBG MONITORING, ED - Abnormal; Notable for the following components:  ? Glucose-Capillary 151 (*)   ? All other components within normal limits  ?MAGNESIUM  ?URINALYSIS, ROUTINE W REFLEX MICROSCOPIC  ?CBG MONITORING, ED  ? ? ?EKG ?None ? ?Radiology ?DG Chest Port 1 View ? ?Result Date: 08/04/2021 ?CLINICAL DATA:  Low blood sugar. EXAM: PORTABLE CHEST 1 VIEW COMPARISON:  06/12/2021 FINDINGS: Chronic cardiomegaly. Dialysis catheter on the right with tip at the SVC. Small right  pleural effusion. No consolidation, pulmonary edema or pneumothorax. IMPRESSION: Chronic cardiomegaly and small right pleural effusion. No acute finding. Electronically Signed   By: Jorje Guild M.D.   On: 08/04/2021 06:34   ? ?Procedures ?Procedures  ? ? ?Medications Ordered in ED ?Medications  ?potassium chloride SA (KLOR-CON M) CR tablet 40 mEq (has no administration in time range)  ?dextrose 50 % solution 50 mL (50 mLs Intravenous Given 08/04/21 0558)  ? ? ?ED  Course/ Medical Decision Making/ A&P ?  ?                        ?Medical Decision Making ?Amount and/or Complexity of Data Reviewed ?Radiology: ordered. ? ?Risk ?Prescription drug management. ? ? ?Patient presents to the emergency department for evaluation of hypoglycemia.  Patient initially called out to EMS because her family noticed that she was shaking.  This was noted to be secondary to hypoglycemic episode.  Patient given dextrose by EMS with improvement of her blood sugar.  She was monitored in triage and had a repeat hypoglycemic episode, brought back to the ED exam room and given D50.  Repeat blood sugar is appropriately elevated after dextrose injection.  We will continue to monitor here to ensure that she is not dropping again.  Will sign to oncoming ER physician to follow-up blood sugars. ? ?Remainder of work-up has been unremarkable.  Etiology of hypoglycemia unclear at this time.  No evidence of infection.  No recent medication changes, however she did recently start dialysis.  She is due for dialysis today, will discharge to dialysis when appropriate. ? ? ? ? ? ? ? ?Final Clinical Impression(s) / ED Diagnoses ?Final diagnoses:  ?Hypoglycemia  ?Hypokalemia  ? ? ?Rx / DC Orders ?ED Discharge Orders   ? ? None  ? ?  ? ? ?  ?Orpah Greek, MD ?08/04/21 [phone] ? ?

## 2021-08-05 ENCOUNTER — Other Ambulatory Visit: Payer: Self-pay

## 2021-08-27 NOTE — Pre-Procedure Instructions (Signed)
Surgical Instructions ? ? ? Your procedure is scheduled on Friday, August 28, 2021 at 11:35 AM. ? Report to [facility] Main Entrance "A" at 9:00 A.M., then check in with the Admitting office. ? Call this number if you have problems the morning of surgery: ? [phone] ? ? If you have any questions prior to your surgery date call [phone]: Open Monday-Friday 8am-4pm ? ? ? Remember: ? Do not eat or drink after midnight the night before your surgery. ?  ? Take these medicines the morning of surgery with A SIP OF WATER: ? ?pravastatin (PRAVACHOL) ?acetaminophen (TYLENOL)  - if needed ? ? ?As of today, STOP taking any Aspirin (unless otherwise instructed by your surgeon) Aleve, Naproxen, Ibuprofen, Motrin, Advil, Goody's, BC's, all herbal medications, fish oil, and all vitamins. ? ?WHAT DO I DO ABOUT MY DIABETES MEDICATION? ? ?THE NIGHT BEFORE SURGERY, take 2.8 units of HUMULIN 70/30 KWIKPEN (70-30)  ?   ?Do not take HUMULIN 70/30 KWIKPEN (70-30) the morning of surgery (08/31/21) ? ? ?HOW TO MANAGE YOUR DIABETES ?BEFORE AND AFTER SURGERY ? ?Why is it important to control my blood sugar before and after surgery? ?Improving blood sugar levels before and after surgery helps healing and can limit problems. ?A way of improving blood sugar control is eating a healthy diet by: ? Eating less sugar and carbohydrates ? Increasing activity/exercise ? Talking with your doctor about reaching your blood sugar goals ?High blood sugars (greater than 180 mg/dL) can raise your risk of infections and slow your recovery, so you will need to focus on controlling your diabetes during the weeks before surgery. ?Make sure that the doctor who takes care of your diabetes knows about your planned surgery including the date and location. ? ?How do I manage my blood sugar before surgery? ?Check your blood sugar at least 4 times a day, starting 2 days before surgery, to make sure that the level is not too high or low. ? ?Check your blood sugar the  morning of your surgery when you wake up and every 2 hours until you get to the Short Stay unit. ? ?If your blood sugar is less than 70 mg/dL, you will need to treat for low blood sugar: ?Do not take insulin. ?Treat a low blood sugar (less than 70 mg/dL) with ? cup of clear juice (cranberry or apple), 4 glucose tablets, OR glucose gel. ?Recheck blood sugar in 15 minutes after treatment (to make sure it is greater than 70 mg/dL). If your blood sugar is not greater than 70 mg/dL on recheck, call [phone] for further instructions. ?Report your blood sugar to the short stay nurse when you get to Short Stay. ? ?If you are admitted to the hospital after surgery: ?Your blood sugar will be checked by the staff and you will probably be given insulin after surgery (instead of oral diabetes medicines) to make sure you have good blood sugar levels. ?The goal for blood sugar control after surgery is 80-180 mg/dL. ? ?         ?           ?Do NOT Smoke (Tobacco/Vaping) for 24 hours prior to your procedure. ? ?If you use a CPAP at night, you may bring your mask/headgear for your overnight stay. ?  ?Contacts, glasses, piercing's, hearing aid's, dentures or partials may not be worn into surgery, please bring cases for these belongings.  ?  ?For patients admitted to the hospital, discharge time will be determined by your  treatment team. ?  ?Patients discharged the day of surgery will not be allowed to drive home, and someone needs to stay with them for 24 hours. ? ?SURGICAL WAITING ROOM VISITATION ?Patients having surgery or a procedure may have two support people in the waiting area. ?Visitors may stay in the waiting area during the procedure and switch out with other visitors if needed. ?Children under the age of 50 must have an adult accompany them who is not the patient. ?If the patient needs to stay at the hospital during part of their recovery, the visitor guidelines for inpatient rooms apply. ? ?Please refer to the  [facility] website for the visitor guidelines for Inpatients (after your surgery is over and you are in a regular room). ? ? ?Day of Surgery: ?Take a shower with mild soap. ?Do not wear jewelry or makeup ?Do not wear lotions, powders, perfumes, or deodorant. ?Do not shave 48 hours prior to surgery. ?Do not bring valuables to the hospital.  ?[facility] is not responsible for any belongings or valuables. ?Do not wear nail polish, gel polish, artificial nails, or any other type of covering on natural nails (fingers and toes) ?If you have artificial nails or gel coating that need to be removed by a nail salon, please have this removed prior to surgery. Artificial nails or gel coating may interfere with anesthesia's ability to adequately monitor your vital signs. ?Wear Clean/Comfortable clothing the morning of surgery ?Do not apply any deodorants/lotions.   ?Remember to brush your teeth WITH YOUR REGULAR TOOTHPASTE. ?  ?Please read over the following fact sheets that you were given. ? ?If you received a COVID test during your pre-op visit  it is requested that you wear a mask when out in public, stay away from anyone that may not be feeling well and notify your surgeon if you develop symptoms. If you have been in contact with anyone that has tested positive in the last 10 days please notify you surgeon. ?  ? ?

## 2021-08-27 NOTE — Progress Notes (Signed)
Attempted to call pt and pt daughter for instructions. Pt VM full, unable to leave VM. Pt daughter answered and pre-op call started, but call dropped and unable to reach pt again. LVM for arrival time, NPO after midnight and medications to take with small sip of water, to check CBG as soon as pt gets up in the morning, take 2.8 units of Humulin 70/30 night before surgery. (Instructions left VM -see note) ?

## 2021-08-27 NOTE — Progress Notes (Signed)
Anesthesia Chart Review: ?Same day workup ? ?Follows with cardiology for history of moderate pulmonary hypertension, DOE, HFpEF (grade 3 DD on echo 12/2020), paroxysmal atrial fibrillation, moderate MR, severe TR. Admitted 08/2020 with influenza and found to have new onset atrial fibrillation.  She underwent successful direct-current cardioversion 09/13/2020.  When last seen for cardiology follow-up 05/12/2021, tentative plan at that time was to resume Eliquis due to CHA2DS2-VASc score of 5 (had evidently been stopped inadvertently by patient), however it does not appear this ever happened. ? ?Patient was admitted February 2023 for worsening kidney function bilateral lower extremity swelling.  During admission she was started on dialysis via tunneled catheter placed on 2/13.  She underwent left arm brachial artery to basilic vein AV fistula creation on 06/18/2021.  She was discharged with dialysis on Tuesday Thursday Saturday schedule. ? 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 ? ?Patient will need day of surgery labs and evaluation. ? ?EKG 06/12/2021: Wide QRS rhythm.  Rate 81.  Right bundle branch block.  Septal infarct, age undetermined.  No significant change from prior. ? ?Echocardiogram 12/26/2020:  ?Normal LV systolic function with visual EF 60-65%. Left ventricle cavity  ?is normal in size. Mild left ventricular hypertrophy. D-shaped septum in  ?systole and diastole suggestive of RV pressure and volume overload.    ?Normal global wall motion. Doppler evidence of grade III (restrictive)  ?diastolic dysfunction, elevated LAP.  ?Left atrial cavity is mildly dilated. Atrial septum bows from right to  ?left, suggestive of elevated RAP.  ?Right atrial cavity is visually dilated.  ?Right ventricle cavity is visually dilated and normal function.  ?Mild (Grade I) aortic regurgitation.  ?Mild mitral valve annulus calcification. Moderate (Grade III) mitral  ?regurgitation.  ?Severe tricuspid regurgitation. Moderate  pulmonary hypertension. RVSP  ?measures 51 mmHg (may be unappreciated given RAE and dilated IVC leading  ?to equalization of pressures).  ?Moderate pulmonic regurgitation.  ?IVC is dilated with a respiratory response of <50%.  ?Compared to study 06/19/2020 G2DD is now G3DD,TR and PHTN remains stable  ?otherwise no significant change.  ? ? ? ?Karoline Caldwell, PA-C ?[facility]/Anesthesiology ?Phone [phone] ?08/27/2021 11:00 AM ? ?

## 2021-08-27 NOTE — Anesthesia Preprocedure Evaluation (Addendum)
Anesthesia Evaluation  ?Patient identified by MRN, date of birth, ID band ?Patient confused ? ? ? ?Reviewed: ?NPO status , Patient's Chart, lab work & pertinent test results ? ?Airway ?Mallampati: II ? ?TM Distance: >3 FB ?Neck ROM: Full ? ? ? Dental ? ?(+) Upper Dentures, Lower Dentures ?  ?Pulmonary ? ?  ?Pulmonary exam normal ? ? ? ? ? ? ? Cardiovascular ?hypertension, Pt. on medications ?+CHF  ?+ dysrhythmias Atrial Fibrillation  ?Rhythm:Regular Rate:Normal ? ? ?  ?Neuro/Psych ?Dementia   ? GI/Hepatic ?negative GI ROS, Neg liver ROS,   ?Endo/Other  ?diabetes, Type 2, Insulin Dependent ? Renal/GU ?ESRF and DialysisRenal disease  ?negative genitourinary ?  ?Musculoskeletal ?negative musculoskeletal ROS ?(+)  ? Abdominal ?Normal abdominal exam  (+)   ?Peds ? Hematology ? ?(+) Blood dyscrasia, anemia ,   ?Anesthesia Other Findings ? ? Reproductive/Obstetrics ? ?  ? ? ? ? ? ? ? ? ? ? ? ? ? ?  ?  ? ? ? ? ? ? ? ?Anesthesia Physical ?Anesthesia Plan ? ?ASA: 3 ? ?Anesthesia Plan: MAC and Regional  ? ?Post-op Pain Management: Regional block*  ? ?Induction: Intravenous ? ?PONV Risk Score and Plan: 2 and Ondansetron, Dexamethasone, Propofol infusion and Treatment may vary due to age or medical condition ? ?Airway Management Planned: Simple Face Mask, Natural Airway and Nasal Cannula ? ?Additional Equipment: None ? ?Intra-op Plan:  ? ?Post-operative Plan:  ? ?Informed Consent: I have reviewed the patients History and Physical, chart, labs and discussed the procedure including the risks, benefits and alternatives for the proposed anesthesia with the patient or authorized representative who has indicated his/her understanding and acceptance.  ? ? ? ?Dental advisory given ? ?Plan Discussed with: CRNA ? ?Anesthesia Plan Comments: (PAT note by Karoline Caldwell, PA-C: ?Follows with cardiology for history of moderate pulmonary hypertension, DOE, HFpEF (grade 3 DD on echo 12/2020), paroxysmal atrial  fibrillation, moderate MR, severe TR. Admitted 08/2020 with influenza and found to have new onset atrial fibrillation.  She underwent successful direct-current cardioversion 09/13/2020.  When last seen for cardiology follow-up 05/12/2021, tentative plan at that time was to resume Eliquis due to CHA2DS2-VASc score of 5 (had evidently been stopped inadvertently by patient), however it does not appear this ever happened. ? ?Patient was admitted February 2023 for worsening kidney function bilateral lower extremity swelling.  During admission she was started on dialysis via tunneled catheter placed on 2/13.  She underwent left arm brachial artery to basilic vein AV fistula creation on 06/18/2021.  She was discharged with dialysis on Tuesday Thursday Saturday schedule. ? 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 ? ?Patient will need day of surgery labs and evaluation. ? ?EKG 06/12/2021: Wide QRS rhythm.  Rate 81.  Right bundle branch block.  Septal infarct, age undetermined.  No significant change from prior. ? ?Echocardiogram 12/26/2020:  ?Normal LV systolic function with visual EF 60-65%. Left ventricle cavity  ?is normal in size. Mild left ventricular hypertrophy. D-shaped septum in  ?systole and diastole suggestive of RV pressure and volume overload. ?  ?Normal global wall motion. Doppler evidence of grade III (restrictive)  ?diastolic dysfunction, elevated LAP.  ?Left atrial cavity is mildly dilated. Atrial septum bows from right to  ?left, suggestive of elevated RAP.  ?Right atrial cavity is visually dilated.  ?Right ventricle cavity is visually dilated and normal function.  ?Mild (Grade I) aortic regurgitation.  ?Mild mitral valve annulus calcification. Moderate (Grade III) mitral  ?regurgitation.  ?Severe tricuspid  regurgitation. Moderate pulmonary hypertension. RVSP  ?measures 51 mmHg (may be unappreciated given RAE and dilated IVC leading  ?to equalization of pressures).  ?Moderate pulmonic regurgitation.   ?IVC is dilated with a respiratory response of <50%.  ?Compared to study 06/19/2020 G2DD is now G3DD,TR and PHTN remains stable  ?otherwise no significant change.  ? ?)  ? ? ? ? ? ?Anesthesia Quick Evaluation ? ?

## 2021-08-28 ENCOUNTER — Ambulatory Visit (HOSPITAL_BASED_OUTPATIENT_CLINIC_OR_DEPARTMENT_OTHER): Payer: Medicare HMO | Admitting: Physician Assistant

## 2021-08-28 ENCOUNTER — Other Ambulatory Visit (HOSPITAL_COMMUNITY): Payer: Self-pay

## 2021-08-28 ENCOUNTER — Encounter (HOSPITAL_COMMUNITY): Payer: Self-pay | Admitting: Vascular Surgery

## 2021-08-28 ENCOUNTER — Ambulatory Visit (HOSPITAL_COMMUNITY): Payer: Medicare HMO | Admitting: Physician Assistant

## 2021-08-28 ENCOUNTER — Other Ambulatory Visit: Payer: Self-pay

## 2021-08-28 ENCOUNTER — Encounter (HOSPITAL_COMMUNITY)
Admission: RE | Disposition: A | Discharge status: Home / Self Care | Payer: Self-pay | Source: Home / Self Care | Attending: Vascular Surgery

## 2021-08-28 ENCOUNTER — Ambulatory Visit (HOSPITAL_COMMUNITY)
Admission: RE | Admit: 2021-08-28 | Discharge: 2021-08-28 | Disposition: A | Discharge status: Home / Self Care | Payer: Medicare HMO | Attending: Vascular Surgery | Admitting: Vascular Surgery

## 2021-08-28 DIAGNOSIS — Z794 Long term (current) use of insulin: Secondary | ICD-10-CM | POA: Diagnosis not present

## 2021-08-28 DIAGNOSIS — I132 Hypertensive heart and chronic kidney disease with heart failure and with stage 5 chronic kidney disease, or end stage renal disease: Secondary | ICD-10-CM

## 2021-08-28 DIAGNOSIS — E1122 Type 2 diabetes mellitus with diabetic chronic kidney disease: Secondary | ICD-10-CM | POA: Diagnosis not present

## 2021-08-28 DIAGNOSIS — I509 Heart failure, unspecified: Secondary | ICD-10-CM

## 2021-08-28 DIAGNOSIS — I120 Hypertensive chronic kidney disease with stage 5 chronic kidney disease or end stage renal disease: Secondary | ICD-10-CM | POA: Insufficient documentation

## 2021-08-28 DIAGNOSIS — N186 End stage renal disease: Secondary | ICD-10-CM | POA: Diagnosis not present

## 2021-08-28 DIAGNOSIS — N185 Chronic kidney disease, stage 5: Secondary | ICD-10-CM | POA: Diagnosis not present

## 2021-08-28 DIAGNOSIS — Z992 Dependence on renal dialysis: Secondary | ICD-10-CM

## 2021-08-28 HISTORY — PX: BASCILIC VEIN TRANSPOSITION: SHX5742

## 2021-08-28 LAB — POCT I-STAT, CHEM 8
BUN: 12 mg/dL (ref 8–23)
Calcium, Ion: 1.08 mmol/L — ABNORMAL LOW (ref 1.15–1.40)
Chloride: 98 mmol/L (ref 98–111)
Creatinine, Ser: 2 mg/dL — ABNORMAL HIGH (ref 0.44–1.00)
Glucose, Bld: 146 mg/dL — ABNORMAL HIGH (ref 70–99)
HCT: 43 % (ref 36.0–46.0)
Hemoglobin: 14.6 g/dL (ref 12.0–15.0)
Potassium: 4.7 mmol/L (ref 3.5–5.1)
Sodium: 136 mmol/L (ref 135–145)
TCO2: 32 mmol/L (ref 22–32)

## 2021-08-28 LAB — GLUCOSE, CAPILLARY
Glucose-Capillary: 151 mg/dL — ABNORMAL HIGH (ref 70–99)
Glucose-Capillary: 152 mg/dL — ABNORMAL HIGH (ref 70–99)

## 2021-08-28 SURGERY — TRANSPOSITION, VEIN, BASILIC
Anesthesia: Monitor Anesthesia Care | Site: Arm Upper | Laterality: Left

## 2021-08-28 MED ORDER — PROPOFOL 500 MG/50ML IV EMUL
INTRAVENOUS | Status: DC | PRN
  Administered 2021-08-28: 25 ug/kg/min via INTRAVENOUS

## 2021-08-28 MED ORDER — ACETAMINOPHEN 10 MG/ML IV SOLN: 1000.0000 mg | Freq: Once | INTRAVENOUS | Status: DC | PRN

## 2021-08-28 MED ORDER — SODIUM CHLORIDE 0.9 % IV SOLN: INTRAVENOUS | Status: DC

## 2021-08-28 MED ORDER — ORAL CARE MOUTH RINSE: 15.0000 mL | Freq: Once | OROMUCOSAL | Status: AC

## 2021-08-28 MED ORDER — CHLORHEXIDINE GLUCONATE 0.12 % MT SOLN
OROMUCOSAL | Status: AC
  Administered 2021-08-28: 15 mL via OROMUCOSAL
  Filled 2021-08-28: qty 15

## 2021-08-28 MED ORDER — CHLORHEXIDINE GLUCONATE 0.12 % MT SOLN: 15.0000 mL | Freq: Once | OROMUCOSAL | Status: AC

## 2021-08-28 MED ORDER — 0.9 % SODIUM CHLORIDE (POUR BTL) OPTIME
TOPICAL | Status: DC | PRN
  Administered 2021-08-28: 1000 mL

## 2021-08-28 MED ORDER — FENTANYL CITRATE (PF) 100 MCG/2ML IJ SOLN
INTRAMUSCULAR | Status: AC
  Administered 2021-08-28: 25 ug via INTRAVENOUS
  Filled 2021-08-28: qty 2

## 2021-08-28 MED ORDER — CHLORHEXIDINE GLUCONATE 4 % EX LIQD: 60.0000 mL | Freq: Once | CUTANEOUS | Status: DC

## 2021-08-28 MED ORDER — FENTANYL CITRATE (PF) 100 MCG/2ML IJ SOLN: 25.0000 ug | Freq: Once | INTRAMUSCULAR | Status: AC

## 2021-08-28 MED ORDER — MEPIVACAINE HCL (PF) 2 % IJ SOLN
INTRAMUSCULAR | Status: DC | PRN
  Administered 2021-08-28: 20 mL

## 2021-08-28 MED ORDER — STERILE WATER FOR IRRIGATION IR SOLN
Status: DC | PRN
  Administered 2021-08-28: 1000 mL

## 2021-08-28 MED ORDER — CEFAZOLIN SODIUM-DEXTROSE 2-4 GM/100ML-% IV SOLN
2.0000 g | INTRAVENOUS | Status: AC
  Administered 2021-08-28: 2 g via INTRAVENOUS
  Filled 2021-08-28: qty 100

## 2021-08-28 MED ORDER — HYDROCODONE-ACETAMINOPHEN 5-325 MG PO TABS: 1.0000 | ORAL_TABLET | Freq: Four times a day (QID) | ORAL | 0 refills | Status: DC | PRN

## 2021-08-28 MED ORDER — HEPARIN 6000 UNIT IRRIGATION SOLUTION
Status: DC | PRN
  Administered 2021-08-28: 1

## 2021-08-28 MED ORDER — HYDROCODONE-ACETAMINOPHEN 5-325 MG PO TABS
1.0000 | ORAL_TABLET | Freq: Four times a day (QID) | ORAL | 0 refills | Status: DC | PRN
  Filled 2021-08-28: qty 20, 5d supply, fill #0

## 2021-08-28 MED ORDER — FENTANYL CITRATE (PF) 100 MCG/2ML IJ SOLN: 25.0000 ug | INTRAMUSCULAR | Status: DC | PRN

## 2021-08-28 MED ORDER — MIDAZOLAM HCL 2 MG/2ML IJ SOLN
INTRAMUSCULAR | Status: AC
  Filled 2021-08-28: qty 2

## 2021-08-28 SURGICAL SUPPLY — 34 items
ARMBAND PINK RESTRICT EXTREMIT (MISCELLANEOUS) ×2 IMPLANT
BAG COUNTER SPONGE SURGICOUNT (BAG) ×2 IMPLANT
CANISTER SUCT 3000ML PPV (MISCELLANEOUS) ×2 IMPLANT
CLIP LIGATING EXTRA MED SLVR (CLIP) ×2 IMPLANT
CLIP LIGATING EXTRA SM BLUE (MISCELLANEOUS) ×2 IMPLANT
COVER PROBE W GEL 5X96 (DRAPES) ×3 IMPLANT
DERMABOND ADVANCED (GAUZE/BANDAGES/DRESSINGS) ×2
DERMABOND ADVANCED .7 DNX12 (GAUZE/BANDAGES/DRESSINGS) ×1 IMPLANT
ELECT REM PT RETURN 9FT ADLT (ELECTROSURGICAL) ×2
ELECTRODE REM PT RTRN 9FT ADLT (ELECTROSURGICAL) ×1 IMPLANT
GLOVE BIO SURGEON STRL SZ 6 (GLOVE) ×1 IMPLANT
GLOVE BIO SURGEON STRL SZ7.5 (GLOVE) ×3 IMPLANT
GLOVE BIOGEL PI IND STRL 7.5 (GLOVE) IMPLANT
GLOVE BIOGEL PI INDICATOR 7.5 (GLOVE) ×1
GLOVE ECLIPSE 7.0 STRL STRAW (GLOVE) ×1 IMPLANT
GOWN STRL REUS W/ TWL LRG LVL3 (GOWN DISPOSABLE) ×2 IMPLANT
GOWN STRL REUS W/ TWL XL LVL3 (GOWN DISPOSABLE) ×1 IMPLANT
GOWN STRL REUS W/TWL LRG LVL3 (GOWN DISPOSABLE) ×2
GOWN STRL REUS W/TWL XL LVL3 (GOWN DISPOSABLE) ×1
KIT BASIN OR (CUSTOM PROCEDURE TRAY) ×2 IMPLANT
KIT TURNOVER KIT B (KITS) ×2 IMPLANT
NS IRRIG 1000ML POUR BTL (IV SOLUTION) ×2 IMPLANT
PACK CV ACCESS (CUSTOM PROCEDURE TRAY) ×2 IMPLANT
PAD ARMBOARD 7.5X6 YLW CONV (MISCELLANEOUS) ×4 IMPLANT
SLING ARM FOAM STRAP LRG (SOFTGOODS) IMPLANT
SLING ARM FOAM STRAP MED (SOFTGOODS) ×1 IMPLANT
SUT MNCRL AB 4-0 PS2 18 (SUTURE) ×3 IMPLANT
SUT PROLENE 6 0 BV (SUTURE) ×2 IMPLANT
SUT SILK 2 0 SH (SUTURE) ×1 IMPLANT
SUT VIC AB 3-0 SH 27 (SUTURE) ×2
SUT VIC AB 3-0 SH 27X BRD (SUTURE) ×1 IMPLANT
TOWEL GREEN STERILE (TOWEL DISPOSABLE) ×2 IMPLANT
UNDERPAD 30X36 HEAVY ABSORB (UNDERPADS AND DIAPERS) ×2 IMPLANT
WATER STERILE IRR 1000ML POUR (IV SOLUTION) ×2 IMPLANT

## 2021-08-28 NOTE — Anesthesia Procedure Notes (Signed)
Anesthesia Regional Block: Supraclavicular block  ? ?Pre-Anesthetic Checklist: , timeout performed,  Correct Patient, Correct Site, Correct Laterality,  Correct Procedure, Correct Position, site marked,  Risks and benefits discussed,  Surgical consent,  Pre-op evaluation,  At surgeon's request and post-op pain management ? ?Laterality: Left ? ?Prep: Dura Prep     ?  ?Needles:  ?Injection technique: Single-shot ? ?Needle Type: Echogenic Stimulator Needle   ? ? ?Needle Length: 5cm  ?Needle Gauge: 20  ? ? ? ?Additional Needles: ? ? ?Procedures:,,,, ultrasound used (permanent image in chart),,    ?Narrative:  ?Start time: 08/28/2021 9:30 AM ?End time: 08/28/2021 9:34 AM ?Injection made incrementally with aspirations every 5 mL. ? ?Performed by: Personally  ?Anesthesiologist: Darral Dash, DO ? ?Additional Notes: ?Patient identified. Risks/Benefits/Options discussed with patient including but not limited to bleeding, infection, nerve damage, failed block, incomplete pain control. Patient expressed understanding and wished to proceed. All questions were answered. Sterile technique was used throughout the entire procedure. Please see nursing notes for vital signs. Aspirated in 5cc intervals with injection for negative confirmation. Patient was given instructions on fall risk and not to get out of bed. All questions and concerns addressed with instructions to call with any issues or inadequate analgesia.   ?  ? ? ? ? ?

## 2021-08-28 NOTE — Anesthesia Postprocedure Evaluation (Signed)
Anesthesia Post Note ? ?Patient: [name] ? ?Procedure(s) Performed: LEFT ARM BASILIC VEIN TRANSPOSITION (Left: Arm Upper) ? ?  ? ?Patient location during evaluation: PACU ?Anesthesia Type: Regional and MAC ?Level of consciousness: awake and alert ?Pain management: pain level controlled ?Vital Signs Assessment: post-procedure vital signs reviewed and stable ?Respiratory status: spontaneous breathing, nonlabored ventilation, respiratory function stable and patient connected to nasal cannula oxygen ?Cardiovascular status: stable and blood pressure returned to baseline ?Postop Assessment: no apparent nausea or vomiting ?Anesthetic complications: no ? ? ?No notable events documented. ? ?Last Vitals:  ?Vitals:  ? 08/28/21 1120 08/28/21 1150  ?BP: 106/65 114/70  ?Pulse: 74 68  ?Resp: 11 18  ?Temp:  36.6 ?C  ?SpO2: 96% 97%  ?  ?Last Pain:  ?Vitals:  ? 08/28/21 1135  ?TempSrc:   ?PainSc: 0-No pain  ? ? ?  ?  ?  ?  ?  ?  ? ?March Rummage Faline Langer ? ? ? ? ?

## 2021-08-28 NOTE — Transfer of Care (Signed)
Immediate Anesthesia Transfer of Care Note ? ?Patient: [name] ? ?Procedure(s) Performed: LEFT ARM BASILIC VEIN TRANSPOSITION (Left: Arm Upper) ? ?Patient Location: PACU ? ?Anesthesia Type:MAC and Regional ? ?Level of Consciousness: drowsy ? ?Airway & Oxygen Therapy: Patient Spontanous Breathing and Patient connected to nasal cannula oxygen ? ?Post-op Assessment: Report given to RN and Post -op Vital signs reviewed and stable ? ?Post vital signs: Reviewed and stable ? ?Last Vitals:  ?Vitals Value Taken Time  ?BP 97/59 08/28/21 1105  ?Temp    ?Pulse 63 08/28/21 1106  ?Resp 11 08/28/21 1106  ?SpO2 96 % 08/28/21 1106  ?Vitals shown include unvalidated device data. ? ?Last Pain:  ?Vitals:  ? 08/28/21 0930  ?TempSrc:   ?PainSc: 0-No pain  ?   ? ?  ? ?Complications: No notable events documented. ?

## 2021-08-28 NOTE — Discharge Instructions (Signed)
° °  [facility] ° °Discharge Instructions ° °AV Fistula or Graft Surgery for Dialysis Access ° °Please refer to the following instructions for your post-procedure care. Your surgeon or physician assistant will discuss any changes with you. ° °Activity ° °You may drive the day following your surgery, if you are comfortable and no longer taking prescription pain medication. Resume full activity as the soreness in your incision resolves. ° °Bathing/Showering ° °You may shower after you go home. Keep your incision dry for 48 hours. Do not soak in a bathtub, hot tub, or swim until the incision heals completely. You may not shower if you have a hemodialysis catheter. ° °Incision Care ° °Clean your incision with mild soap and water after 48 hours. Pat the area dry with a clean towel. You do not need a bandage unless otherwise instructed. Do not apply any ointments or creams to your incision. You may have skin glue on your incision. Do not peel it off. It will come off on its own in about one week. Your arm may swell a bit after surgery. To reduce swelling use pillows to elevate your arm so it is above your heart. Your doctor will tell you if you need to lightly wrap your arm with an ACE bandage. ° °Diet ° °Resume your normal diet. There are not special food restrictions following this procedure. In order to heal from your surgery, it is CRITICAL to get adequate nutrition. Your body requires vitamins, minerals, and protein. Vegetables are the best source of vitamins and minerals. Vegetables also provide the perfect balance of protein. Processed food has little nutritional value, so try to avoid this. ° °Medications ° °Resume taking all of your medications. If your incision is causing pain, you may take over-the counter pain relievers such as acetaminophen (Tylenol). If you were prescribed a stronger pain medication, please be aware these medications can cause nausea and constipation. Prevent  nausea by taking the medication with a snack or meal. Avoid constipation by drinking plenty of fluids and eating foods with high amount of fiber, such as fruits, vegetables, and grains. Do not take Tylenol if you are taking prescription pain medications. ° ° ° ° °Follow up °Your surgeon may want to see you in the office following your access surgery. If so, this will be arranged at the time of your surgery. ° °Please call us immediately for any of the following conditions: ° °Increased pain, redness, drainage (pus) from your incision site °Fever of 101 degrees or higher °Severe or worsening pain at your incision site °Hand pain or numbness. ° °Reduce your risk of vascular disease: ° °Stop smoking. If you would like help, call [facility] at 1-800-QUIT-NOW (1-800-784-8669) or  at 336-586-4000 ° °Manage your cholesterol °Maintain a desired weight °Control your diabetes °Keep your blood pressure down ° °Dialysis ° °It will take several weeks to several months for your new dialysis access to be ready for use. Your surgeon will determine when it is OK to use it. Your nephrologist will continue to direct your dialysis. You can continue to use your Permcath until your new access is ready for use. ° °If you have any questions, please call the office at [phone]. ° °

## 2021-08-28 NOTE — Op Note (Signed)
? ? ?  [name] MRN: [id number] DOB: [birth year] Sex: female ? ?08/28/2021 ?Pre-operative Diagnosis: esrd ?Post-operative diagnosis:  Same ?Surgeon:  [name]. Donzetta Matters, MD ?Assistant: Laurence Slate, PA ?Procedure Performed:  Left arm second stage basilic vein arteriovenous fistula revision with transposition ? ?Indications: 81 year old female with end-stage renal disease has a previous for stage basilic vein fistula on the left.  She is now indicated for revision with transposition ? ?Findings: Fistula had a narrowing just after the anastomosis this was dilated and actually removed some of this.  The rest the fistula was maturing well.  At completion there was very strong thrill in the fistula and a palpable radial artery pulse at the wrist both confirmed with Doppler. ?  ?Procedure:  The patient was identified in the holding area and taken to the operating room where is placed supine on the operative table and MAC anesthesia was induced.  Preoperative block of been placed.  She was to the prepped draped in the left upper extremity usual fashion, antibiotics were minister timeout was called.  Ultrasound was used to follow the fistula and marked the branches on the skin.  There was a narrowing just above the antecubitum.  An incision was made above the antecubitum we dissected down to the fistula.  We then made 2 separate skip incisions to the axilla.  Branches were divided between ties.  The fascia was marked for orientation.  Nerve was protected throughout its course.  We then clamped back near the anastomosis transected the fistula where it had thinned.  We then flushed with heparinized saline clamped and flushed there was no areas of leakage.  Was tunneled laterally maintaining orientation flush with heparinized and reclamped.  We then spatulated both ends removing the narrowed area and sending them end-to-end with 6-0 Prolene suture.  Prior completion without flushing or directions.  Upon completion  there was a very strong thrill in the runoff fistula confirmed with Doppler as well as a palpable radial artery pulse the wrist confirmed with Doppler.  Satisfied with this we irrigated the wound we obtain hemostasis and closed in layers of Vicryl and Monocryl.  She was awakened from anesthesia having tolerated procedure without any complication.  All counts were correct at completion. ? ? ?EBL: 100cc ? ? ?Foye Damron C. Donzetta Matters, MD ?[facility] ?Office: [phone] ?Pager: [phone] ? ? ?

## 2021-08-28 NOTE — Anesthesia Procedure Notes (Signed)
Procedure Name: [place] ?Date/Time: 08/28/2021 10:03 AM ?Performed by: Lieutenant Diego, CRNA ?Pre-anesthesia Checklist: Emergency Drugs available, Suction available, Patient being monitored, Timeout performed and Patient identified ?Patient Re-evaluated:Patient Re-evaluated prior to induction ?Oxygen Delivery Method: Nasal cannula ?Preoxygenation: Pre-oxygenation with 100% oxygen ?Induction Type: IV induction ? ? ? ? ?

## 2021-08-28 NOTE — Interval H&P Note (Signed)
History and Physical Interval Note: ? ?08/28/2021 ?8:56 AM ? ?[name]  has presented today for surgery, with the diagnosis of End Stage Renal Disease.  The various methods of treatment have been discussed with the patient and family. After consideration of risks, benefits and other options for treatment, the patient has consented to  Procedure(s): ?LEFT ARM BASILIC VEIN TRANSPOSITION (Left) as a surgical intervention.  The patient's history has been reviewed, patient examined, no change in status, stable for surgery.  I have reviewed the patient's chart and labs.  Questions were answered to the patient's satisfaction.   ? ? ?Servando Snare ? ? ?

## 2021-08-29 ENCOUNTER — Encounter (HOSPITAL_COMMUNITY): Payer: Self-pay | Admitting: Vascular Surgery

## 2021-09-16 ENCOUNTER — Ambulatory Visit (INDEPENDENT_AMBULATORY_CARE_PROVIDER_SITE_OTHER): Payer: Medicare HMO | Admitting: Physician Assistant

## 2021-09-16 VITALS — BP 110/70 | HR 82 | Temp 97.7°F | Resp 20 | Ht 64.0 in

## 2021-09-16 DIAGNOSIS — N186 End stage renal disease: Secondary | ICD-10-CM

## 2021-09-16 NOTE — Progress Notes (Signed)
? ? ?  Postoperative Access Visit ? ? ?History of Present Illness  ? ?[name] is a 81 y.o. year old female who presents for postoperative follow-up for: left second stage basilic vein transposition (Date: 08/28/21).  The patient's wounds are healed.  The patient denies steal symptoms.  Patient is currently dialyzing via right IJ [facility] on a Tuesday Thursday Saturday schedule. ? ? ?Physical Examination  ? ?Vitals:  ? 09/16/21 1358  ?BP: 110/70  ?Pulse: 82  ?Resp: 20  ?Temp: 97.7 ?F (36.5 ?C)  ?TempSrc: Temporal  ?Height: 5\' 4"  (1.626 m)  ? ?Body mass index is 27.46 kg/m?. ? ?left arm Incisions are healed, palpable radial pulse, hand grip is 5/5, sensation in digits is intact, palpable thrill, bruit can be auscultated   ? ? ?Medical Decision Making  ? ?[name] is a 81 y.o. year old female who presents s/p left second stage basilic vein transposition ? ?Patent basilic vein fistula without signs or symptoms of steal syndrome ?The patient's access will be ready for use 10/06/21 ?The patient's tunneled dialysis catheter can be removed when Nephrology is comfortable with the performance of the fistula ?The patient may follow up on a prn basis ? ? ?Dagoberto Ligas PA-C ?[facility] [place] ?Office: [phone] ? ?Clinic MD: Donzetta Matters ? ?

## 2021-12-18 ENCOUNTER — Other Ambulatory Visit: Payer: Self-pay | Admitting: *Deleted

## 2021-12-18 DIAGNOSIS — N186 End stage renal disease: Secondary | ICD-10-CM

## 2021-12-25 ENCOUNTER — Encounter (HOSPITAL_BASED_OUTPATIENT_CLINIC_OR_DEPARTMENT_OTHER): Payer: Medicare HMO | Attending: General Surgery | Admitting: General Surgery

## 2021-12-25 DIAGNOSIS — I132 Hypertensive heart and chronic kidney disease with heart failure and with stage 5 chronic kidney disease, or end stage renal disease: Secondary | ICD-10-CM | POA: Insufficient documentation

## 2021-12-25 DIAGNOSIS — E11622 Type 2 diabetes mellitus with other skin ulcer: Secondary | ICD-10-CM | POA: Insufficient documentation

## 2021-12-25 DIAGNOSIS — I872 Venous insufficiency (chronic) (peripheral): Secondary | ICD-10-CM | POA: Insufficient documentation

## 2021-12-25 DIAGNOSIS — L97812 Non-pressure chronic ulcer of other part of right lower leg with fat layer exposed: Secondary | ICD-10-CM | POA: Diagnosis not present

## 2021-12-25 DIAGNOSIS — Z992 Dependence on renal dialysis: Secondary | ICD-10-CM | POA: Diagnosis not present

## 2021-12-25 DIAGNOSIS — L97822 Non-pressure chronic ulcer of other part of left lower leg with fat layer exposed: Secondary | ICD-10-CM | POA: Insufficient documentation

## 2021-12-25 DIAGNOSIS — E11621 Type 2 diabetes mellitus with foot ulcer: Secondary | ICD-10-CM | POA: Insufficient documentation

## 2021-12-25 DIAGNOSIS — N186 End stage renal disease: Secondary | ICD-10-CM | POA: Diagnosis not present

## 2021-12-25 DIAGNOSIS — E1122 Type 2 diabetes mellitus with diabetic chronic kidney disease: Secondary | ICD-10-CM | POA: Diagnosis not present

## 2021-12-25 DIAGNOSIS — E1165 Type 2 diabetes mellitus with hyperglycemia: Secondary | ICD-10-CM | POA: Insufficient documentation

## 2021-12-25 DIAGNOSIS — E1140 Type 2 diabetes mellitus with diabetic neuropathy, unspecified: Secondary | ICD-10-CM | POA: Insufficient documentation

## 2021-12-25 NOTE — Progress Notes (Signed)
[name], [name] ([id number]) Visit Report for 12/25/2021 Allergy List Details Patient Name: Date of Service: [name], [name] 12/25/2021 9:00 [place] [name] Number: [id number] Patient Account Number: [id number] Date of Birth/Sex: Treating RN: 04/23/1941 (81 y.o. [name] [name]: [name] Number Other Clinician: Referring [name]: Treating Kensleigh Gates/Extender: [name] [name] in Treatment: 0 Allergies Active Allergies No Known Allergies Allergy Notes Electronic Signature(s) Signed: 12/25/2021 5:36:58 PM By: Baruch Gouty RN, BSN Entered By: Baruch Gouty on 12/25/2021 09:28:37 -------------------------------------------------------------------------------- Arrival Information Details Patient Name: Date of Service: [name], [name] 12/25/2021 9:00 [name] [name] Number: [id number] Patient Account Number: [id number] Date of Birth/Sex: Treating RN: 02-09-1941 (81 y.o. [name] [name]: [name] Number Other Clinician: Referring Sarina Robleto: Treating Mckensey Berghuis/Extender: [name] in Treatment: 0 Visit Information Patient Arrived: Wheel Chair Arrival Time: 09:18 Accompanied By: daughter Transfer Assistance: Manual Patient Identification Verified: Yes Secondary Verification Process Completed: Yes Patient Requires Transmission-Based Precautions: No Patient Has Alerts: No Electronic Signature(s) Signed: 12/25/2021 5:36:58 PM By: Baruch Gouty RN, BSN Entered By: Baruch Gouty on 12/25/2021 09:24:36 -------------------------------------------------------------------------------- Clinic Level of Marquez Assessment Details Patient Name: Date of Service: [name], [name] 12/25/2021 9:00 [name] [name] Number: [id number] Patient Account Number: [id number] Date of Birth/Sex: Treating RN: 19-Jun-1940 (81 y.o. [name] [name]: [name] Number Other  Clinician: Referring Tavin Vernet: Treating Deloma Spindle/Extender: [name] in Treatment: 0 Clinic Level of Marquez Assessment Items TOOL 1 Quantity Score []  - 0 Use when EandM and Procedure is performed on INITIAL visit ASSESSMENTS - Nursing Assessment / Reassessment X- 1 20 General Physical Exam (combine w/ comprehensive assessment (listed just below) when performed on new pt. evals) X- 1 25 Comprehensive Assessment (HX, ROS, Risk Assessments, Wounds Hx, etc.) ASSESSMENTS - Wound and Skin Assessment / Reassessment []  - 0 Dermatologic / Skin Assessment (not related to wound area) ASSESSMENTS - Ostomy and/or Continence Assessment and Marquez []  - 0 Incontinence Assessment and Management []  - 0 Ostomy Marquez Assessment and Management (repouching, etc.) PROCESS - Coordination of Marquez X - Simple Patient / Family Education for ongoing Marquez 1 15 []  - 0 Complex (extensive) Patient / Family Education for ongoing Marquez X- 1 10 Staff obtains Programmer, systems, Records, T Results / Process Orders est []  - 0 Staff telephones HHA, Nursing Homes / Clarify orders / etc []  - 0 Routine Transfer to another Facility (non-emergent condition) []  - 0 Routine Hospital Admission (non-emergent condition) X- 1 15 New Admissions / Biomedical engineer / Ordering NPWT Apligraf, etc. , []  - 0 Emergency Hospital Admission (emergent condition) PROCESS - Special Needs []  - 0 Pediatric / Minor Patient Management []  - 0 Isolation Patient Management []  - 0 Hearing / Language / Visual special needs []  - 0 Assessment of Community assistance (transportation, D/C planning, etc.) []  - 0 Additional assistance / Altered mentation []  - 0 Support Surface(s) Assessment (bed, cushion, seat, etc.) INTERVENTIONS - Miscellaneous []  - 0 External ear exam []  - 0 Patient Transfer (multiple staff / Civil Service fast streamer / Similar devices) []  - 0 Simple Staple / Suture removal (25 or less) []  - 0 Complex  Staple / Suture removal (26 or more) []  - 0 Hypo/Hyperglycemic Management (do not check if billed separately) X- 1 15 Ankle / Brachial Index (ABI) - do not check if billed separately Has the patient been seen at the hospital within the last three years: Yes Total Score: 100 Level Of Marquez:  New/Established - Level 3 Electronic Signature(s) Signed: 12/25/2021 5:36:58 PM By: Baruch Gouty RN, BSN Entered By: Baruch Gouty on 12/25/2021 16:58:43 -------------------------------------------------------------------------------- Lower Extremity Assessment Details Patient Name: Date of Service: [name], [name] 12/25/2021 9:00 [name] [name] Number: [id number] Patient Account Number: [id number] Date of Birth/Sex: Treating RN: [birth year] (81 y.o. [name] Shirl Weir: [name] Number Other Clinician: Referring [name]: Treating [name]: [name] in Treatment: 0 Edema Assessment Assessed: [name]: No] [Right: No] Edema: [Left: Yes] [Right: Yes] Calf Left: Right: Point of Measurement: From Medial Instep 39.7 cm 39 cm Ankle Left: Right: Point of Measurement: From Medial Instep 21 cm 21.8 cm Vascular Assessment Pulses: Dorsalis Pedis Palpable: [Left:No] [Right:No] Blood Pressure: Brachial: [Left:122] [Right:122] Dorsalis Pedis: 70 [Left:Dorsalis Pedis: 182] Ankle: Posterior Tibial: 110 [Left:Posterior Tibial: 168 0.90] [Right:1.49] Electronic Signature(s) Signed: 12/25/2021 5:36:58 PM By: Baruch Gouty RN, BSN Entered By: Baruch Gouty on 12/25/2021 10:06:17 -------------------------------------------------------------------------------- Multi Wound Chart Details Patient Name: Date of Service: [name] 12/25/2021 9:00 [name] [name] Number: [id number] Patient Account Number: [id number] Date of Birth/Sex: Treating RN: [birth year] (81 y.o. [name], [name] Primary Marquez [name]: [name] Number Other Clinician: Referring Skai Lickteig: Treating [name]/Extender: [name] in Treatment: 0 Vital Signs Height(in): 71 Pulse(bpm): 30 Weight(lbs): 160 Blood Pressure(mmHg): 122/80 Body Mass Index(BMI): 26.6 Temperature(F): 98.3 Respiratory Rate(breaths/min): 18 Photos: [1:Right, Anterior Lower Leg] [2:Right, Lateral Lower Leg] [3:Left, Anterior Lower Leg] Wound Location: [1:Trauma] [2:Trauma] [3:Trauma] Wounding Event: [1:Venous Leg Ulcer] [2:Venous Leg Ulcer] [3:Venous Leg Ulcer] Primary Etiology: [1:Diabetic Wound/Ulcer of the Lower] [2:Diabetic Wound/Ulcer of the Lower] [3:Diabetic Wound/Ulcer of the Lower] Secondary Etiology: [1:Extremity Cataracts, Arrhythmia, Congestive Cataracts, Arrhythmia, Congestive Cataracts, Arrhythmia, Congestive] [2:Extremity] [3:Extremity] Comorbid History: [1:Heart Failure, Hypertension, Type II Heart Failure, Hypertension, Type II Heart Failure, Hypertension, Type II Diabetes, End Stage Renal Disease, Diabetes, End Stage Renal Disease, Diabetes, End Stage Renal Disease, Neuropathy  12/18/2021] [2:Neuropathy 12/11/2021] [3:Neuropathy 12/01/2021] Date Acquired: [1:0] [2:0] [3:0] [name] of Treatment: [1:Open] [2:Open] [3:Open] Wound Status: [1:No] [2:No] [3:No] Wound Recurrence: [1:No] [2:No] [3:No] Clustered Wound: [1:N/Marquez] [2:N/Marquez] [3:N/Marquez] Clustered Quantity: [1:1.8x1.8x0.1] [2:0.7x0.8x0.1] [3:4.1x1.3x0.1] Measurements L x W x D (cm) [1:2.545] [2:0.44] [3:4.186] Marquez (cm) : rea [1:0.254] [2:0.044] [3:0.419] Volume (cm) : [1:0.00%] [2:0.00%] [3:0.00%] % Reduction in Marquez [1:rea: 0.00%] [2:0.00%] [3:0.00%] % Reduction in Volume: [1:Full Thickness Without Exposed] [2:Full Thickness Without Exposed] [3:Full Thickness Without Exposed] Classification: [1:Support Structures Medium] [2:Support Structures Medium] [3:Support Structures Large] Exudate Marquez mount: [1:Serous] [2:Serous] [3:Serous] Exudate Type: [1:amber] [2:amber]  [3:amber] Exudate Color: [1:Flat and Intact] [2:Flat and Intact] [3:Flat and Intact] Wound Margin: [1:Large (67-100%)] [2:Large (67-100%)] [3:Large (67-100%)] Granulation Marquez mount: [1:Red] [2:Red] [3:Red] Granulation Quality: [1:Small (1-33%)] [2:None Present (0%)] [3:Small (1-33%)] Necrotic Marquez mount: [1:Adherent Slough] [2:N/Marquez] [3:Adherent Slough] Necrotic Tissue: [1:Fat Layer (Subcutaneous Tissue): Yes Fat Layer (Subcutaneous Tissue): Yes Fat Layer (Subcutaneous Tissue): Yes] Exposed Structures: [1:Fascia: No Tendon: No Muscle: No Joint: No Bone: No Small (1-33%)] [2:Fascia: No Tendon: No Muscle: No Joint: No Bone: No Small (1-33%)] [3:Fascia: No Tendon: No Muscle: No Joint: No Bone: No Small (1-33%)] Epithelialization: [1:Debridement - Selective/Open Wound Debridement - Selective/Open Wound Debridement - Selective/Open Wound] Debridement: Pre-procedure Verification/Time Out 10:20 [2:10:20] [3:10:20] Taken: [1:Lidocaine 4% Topical Solution] [2:Lidocaine 4% Topical Solution] [3:Lidocaine 4% Topical Solution] Pain Control: [1:Slough] [2:Slough] [3:Slough] Tissue Debrided: [1:Non-Viable Tissue] [2:Non-Viable Tissue] [3:Non-Viable Tissue] Level: [1:3.24] [2:0.56] [3:3.9] Debridement Marquez (sq cm): [1:rea Curette] [2:Curette] [3:Curette] Instrument: [1:Minimum] [2:Minimum] [3:Minimum] Bleeding: [1:Pressure] [2:Pressure] [  3:Pressure] Hemostasis Marquez chieved: [1:0] [2:0] [3:0] Procedural Pain: [1:0] [2:0] [3:0] Post Procedural Pain: [1:Procedure was tolerated well] [2:Procedure was tolerated well] [3:Procedure was tolerated well] Debridement Treatment Response: [1:1.8x1.8x0.1] [2:0.7x0.8x0.1] [3:4.1x1.3x0.1] Post Debridement Measurements L x W x D (cm) [1:0.254] [2:0.044] [3:0.419] Post Debridement Volume: (cm) [1:Debridement] [2:Debridement] [3:Debridement] Wound Number: 4 N/Marquez N/Marquez Photos: N/Marquez N/Marquez Left, Distal, Anterior Lower Leg N/Marquez N/Marquez Wound Location: Gradually Appeared N/Marquez N/Marquez Wounding  Event: Venous Leg Ulcer N/Marquez N/Marquez Primary Etiology: Diabetic Wound/Ulcer of the Lower N/Marquez N/Marquez Secondary Etiology: Extremity Cataracts, Arrhythmia, Congestive N/Marquez N/Marquez Comorbid History: Heart Failure, Hypertension, Type II Diabetes, End Stage Renal Disease, Neuropathy 12/01/2021 N/Marquez N/Marquez Date Acquired: 0 N/Marquez N/Marquez [name] of Treatment: Open N/Marquez N/Marquez Wound Status: No N/Marquez N/Marquez Wound Recurrence: Yes N/Marquez N/Marquez Clustered Wound: 3 N/Marquez N/Marquez Clustered Quantity: 6x1.2x0.1 N/Marquez N/Marquez Measurements L x W x D (cm) 5.655 N/Marquez N/Marquez Marquez (cm) : rea 0.565 N/Marquez N/Marquez Volume (cm) : 0.00% N/Marquez N/Marquez % Reduction in Area: 0.00% N/Marquez N/Marquez % Reduction in Volume: Full Thickness Without Exposed N/Marquez N/Marquez Classification: Support Structures Medium N/Marquez N/Marquez Exudate Marquez mount: Serous N/Marquez N/Marquez Exudate Type: amber N/Marquez N/Marquez Exudate Color: Flat and Intact N/Marquez N/Marquez Wound Margin: Large (67-100%) N/Marquez N/Marquez Granulation Marquez mount: Red N/Marquez N/Marquez Granulation Quality: Small (1-33%) N/Marquez N/Marquez Necrotic Marquez mount: Eschar, Adherent Slough N/Marquez N/Marquez Necrotic Tissue: Fat Layer (Subcutaneous Tissue): Yes N/Marquez N/Marquez Exposed Structures: Fascia: No Tendon: No Muscle: No Joint: No Bone: No Small (1-33%) N/Marquez N/Marquez Epithelialization: Debridement - Selective/Open Wound N/Marquez N/Marquez Debridement: Pre-procedure Verification/Time Out 10:20 N/Marquez N/Marquez Taken: Lidocaine 4% Topical Solution N/Marquez N/Marquez Pain Control: Slough N/Marquez N/Marquez Tissue Debrided: Non-Viable Tissue N/Marquez N/Marquez Level: 1.2 N/Marquez N/Marquez Debridement Marquez (sq cm): rea Curette N/Marquez N/Marquez Instrument: Minimum N/Marquez N/Marquez Bleeding: Pressure N/Marquez N/Marquez Hemostasis Marquez chieved: 0 N/Marquez N/Marquez Procedural Pain: 0 N/Marquez N/Marquez Post Procedural Pain: Procedure was tolerated well N/Marquez N/Marquez Debridement Treatment Response: 1x1.2x0.1 N/Marquez N/Marquez Post Debridement Measurements L x W x D (cm) 0.094 N/Marquez N/Marquez Post Debridement Volume: (cm) Debridement N/Marquez N/Marquez Procedures Performed: Treatment Notes Electronic Signature(s) Signed: 12/25/2021 10:35:13 AM  By: [name] MD FACS Signed: 12/25/2021 5:36:58 PM By: Baruch Gouty RN, BSN Entered By: [name] on 12/25/2021 10:35:12 -------------------------------------------------------------------------------- Multi-Disciplinary Marquez Plan Details Patient Name: Date of Service: [name], [name] 12/25/2021 9:00 [name] [name] Number: [id number] Patient Account Number: [id number] Date of Birth/Sex: Treating RN: Sep 24, 1940 (81 y.o. [name] Inda Mcglothen: [name] Number Other Clinician: Referring Trace Wirick: Treating Milo Schreier/Extender: [name] in Treatment: 0 Multidisciplinary Marquez Plan reviewed with physician Active Inactive Abuse / Safety / Falls / Self Marquez Management Nursing Diagnoses: History of Falls Potential for falls Goals: Patient/caregiver will verbalize/demonstrate measures taken to improve the patient's personal safety Date Initiated: 12/25/2021 Target Resolution Date: 01/22/2022 Goal Status: Active Patient/caregiver will verbalize/demonstrate measures taken to prevent injury and/or falls Date Initiated: 12/25/2021 Target Resolution Date: 01/22/2022 Goal Status: Active Interventions: Assess fall risk on admission and as needed Assess impairment of mobility on admission and as needed per policy Assess personal safety and home safety (as indicated) on admission and as needed Notes: Nutrition Nursing Diagnoses: Impaired glucose control: actual or potential Potential for alteratiion in Nutrition/Potential for imbalanced nutrition Goals: Patient/caregiver will maintain therapeutic glucose control Date Initiated: 12/25/2021 Target Resolution Date: 01/22/2022 Goal Status: Active Interventions: Assess HgA1c results as ordered upon admission and as needed Assess patient nutrition upon admission and as needed per  policy Provide education on elevated blood sugars and impact on wound healing Treatment  Activities: Patient referred to Primary Marquez Physician for further nutritional evaluation : 12/25/2021 Notes: Venous Leg Ulcer Nursing Diagnoses: Knowledge deficit related to disease process and management Potential for venous Insuffiency (use before diagnosis confirmed) Goals: Patient will maintain optimal edema control Date Initiated: 12/25/2021 Target Resolution Date: 01/22/2022 Goal Status: Active Verify adequate tissue perfusion prior to therapeutic compression application Date Initiated: 12/25/2021 Target Resolution Date: 01/22/2022 Goal Status: Active Interventions: Assess peripheral edema status every visit. Compression as ordered Provide education on venous insufficiency Treatment Activities: Non-invasive vascular studies : 12/25/2021 T ordered outside of clinic : 12/25/2021 est Therapeutic compression applied : 12/25/2021 Notes: Wound/Skin Impairment Nursing Diagnoses: Impaired tissue integrity Knowledge deficit related to ulceration/compromised skin integrity Goals: Patient/caregiver will verbalize understanding of skin Marquez regimen Date Initiated: 12/25/2021 Target Resolution Date: 01/22/2022 Goal Status: Active Ulcer/skin breakdown will have Marquez volume reduction of 30% by week 4 Date Initiated: 12/25/2021 Target Resolution Date: 01/22/2022 Goal Status: Active Interventions: Assess patient/caregiver ability to obtain necessary supplies Assess patient/caregiver ability to perform ulcer/skin Marquez regimen upon admission and as needed Assess ulceration(s) every visit Provide education on ulcer and skin Marquez Treatment Activities: Skin Marquez regimen initiated : 12/25/2021 Topical wound management initiated : 12/25/2021 Notes: Electronic Signature(s) Signed: 12/25/2021 5:36:58 PM By: Baruch Gouty RN, BSN Entered By: Baruch Gouty on 12/25/2021 10:26:21 -------------------------------------------------------------------------------- Pain Assessment Details Patient  Name: Date of Service: [name], [name] 12/25/2021 9:00 [place] [name] Number: [id number] Patient Account Number: [id number] Date of Birth/Sex: Treating RN: 1940-05-16 (81 y.o. [name] Alany Borman: [name] Number Other Clinician: Referring Micaela Stith: Treating Arlow Spiers/Extender: [name] in Treatment: 0 Active Problems Location of Pain Severity and Description of Pain Patient Has Paino No Site Locations Rate the pain. Current Pain Level: 0 Character of Pain Describe the Pain: Tender Pain Management and Medication Current Pain Management: Electronic Signature(s) Signed: 12/25/2021 5:36:58 PM By: Baruch Gouty RN, BSN Entered By: Baruch Gouty on 12/25/2021 10:23:05 -------------------------------------------------------------------------------- Patient/Caregiver Education Details Patient Name: Date of Service: [name] 8/25/2023andnbsp9:00 [name] [name] Number: [id number] Patient Account Number: [id number] Date of Birth/Gender: Treating RN: 03-13-1941 (81 y.o. [name] Physician: [name] Number Other Clinician: Referring Physician: Treating Physician/Extender: [name] in Treatment: 0 Education Assessment Education Provided To: Patient Education Topics Provided Elevated Blood Sugar/ Impact on Healing: Methods: Explain/Verbal Responses: Reinforcements needed, State content correctly Venous: Methods: Explain/Verbal Responses: Reinforcements needed, State content correctly Wound/Skin Impairment: Methods: Explain/Verbal Responses: Reinforcements needed, State content correctly Electronic Signature(s) Signed: 12/25/2021 5:36:58 PM By: Baruch Gouty RN, BSN Entered By: Baruch Gouty on 12/25/2021 10:26:41 -------------------------------------------------------------------------------- Wound Assessment Details Patient Name: Date of  Service: [name], [name] 12/25/2021 9:00 [name] [name] Number: [id number] Patient Account Number: [id number] Date of Birth/Sex: Treating RN: Jun 28, 1940 (81 y.o. 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 Event: Trauma Etiology: Date Acquired: 12/18/2021 Wound Open [name] Of Treatment: 0 Status: Clustered Wound: No Comorbid Cataracts, Arrhythmia, Congestive Heart Failure, Hypertension, History: Type II Diabetes, End Stage Renal Disease, Neuropathy Photos Wound Measurements Length: (cm) 1.8 Width: (cm) 1.8 Depth: (cm) 0.1 Area: (cm) 2.545 Volume: (cm) 0.254 % Reduction in Area:  0% % Reduction in Volume: 0% Epithelialization: Small (1-33%) Tunneling: No Undermining: No Wound Description Classification: Full Thickness Without Exposed Support Structures Wound Margin: Flat and Intact Exudate Amount: Medium Exudate Type: Serous Exudate Color: amber Foul Odor After Cleansing: No Slough/Fibrino Yes Wound Bed Granulation Amount: Large (67-100%) Exposed Structure Granulation Quality: Red Fascia Exposed: No Necrotic Amount: Small (1-33%) Fat Layer (Subcutaneous Tissue) Exposed: Yes Necrotic Quality: Adherent Slough Tendon Exposed: No Muscle Exposed: No Joint Exposed: No Bone Exposed: No Electronic Signature(s) Signed: 12/25/2021 5:36:58 PM By: Baruch Gouty RN, BSN Entered By: Baruch Gouty on 12/25/2021 10:13:33 -------------------------------------------------------------------------------- Wound Assessment Details Patient Name: Date of Service: [name], [name] 12/25/2021 9:00 [place] [name] Number: [id number] Patient Account Number: [id number] Date of  Birth/Sex: Treating RN: 1941/02/28 (81 y.o. 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 Event: Trauma Etiology: Date Acquired: 12/11/2021 Wound Open [name] Of Treatment: 0 Status: Clustered Wound: No Comorbid Cataracts, Arrhythmia, Congestive Heart Failure, Hypertension, History: Type II Diabetes, End Stage Renal Disease, Neuropathy Photos Wound Measurements Length: (cm) 0.7 Width: (cm) 0.8 Depth: (cm) 0.1 Area: (cm) 0.44 Volume: (cm) 0.044 % Reduction in Area: 0% % Reduction in Volume: 0% Epithelialization: Small (1-33%) Tunneling: No Undermining: No Wound Description Classification: Full Thickness Without Exposed Support Structures Wound Margin: Flat and Intact Exudate Amount: Medium Exudate Type: Serous Exudate Color: amber Foul Odor After Cleansing: No Slough/Fibrino Yes Wound Bed Granulation Amount: Large (67-100%) Exposed Structure Granulation Quality: Red Fascia Exposed: No Necrotic Amount: None Present (0%) Fat Layer (Subcutaneous Tissue) Exposed: Yes Tendon Exposed: No Muscle Exposed: No Joint Exposed: No Bone Exposed: No Electronic Signature(s) Signed: 12/25/2021 5:36:58 PM By: Baruch Gouty RN, BSN Entered By: Baruch Gouty on 12/25/2021 10:13:57 -------------------------------------------------------------------------------- Wound Assessment Details Patient Name: Date of Service: [name], [name] 12/25/2021 9:00 [name] [name] Number: [id number] Patient Account Number: [id number] Date of Birth/Sex: Treating RN: [birth year] (81 y.o. 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 Event: Trauma Etiology: Date Acquired: 12/01/2021 Wound Open [name] Of Treatment: 0 Status: Clustered Wound: No Comorbid Cataracts, Arrhythmia, Congestive Heart Failure, Hypertension, History: Type II Diabetes, End Stage Renal Disease, Neuropathy Photos Wound Measurements Length: (cm) 4.1 Width: (cm) 1.3 Depth: (cm) 0.1 Area: (cm) 4.186 Volume: (cm) 0.419 % Reduction in Area: 0% % Reduction in Volume: 0% Epithelialization: Small (1-33%) Tunneling: No Undermining: No Wound Description Classification: Full Thickness Without Exposed Support Structu Wound Margin: Flat and Intact Exudate Amount: Large Exudate Type: Serous Exudate Color: amber Wound Bed Granulation Amount: Large (67-100%) Granulation Quality: Red Necrotic Amount: Small (1-33%) Necrotic Quality: Adherent Slough res Foul Odor After Cleansing: No Slough/Fibrino Yes Exposed Structure Fascia Exposed: No Fat Layer (Subcutaneous Tissue) Exposed: Yes Tendon Exposed: No Muscle Exposed: No Joint Exposed: No Bone Exposed: No Electronic Signature(s) Signed: 12/25/2021 5:36:58 PM By: Baruch Gouty RN, BSN Entered By: Baruch Gouty on 12/25/2021 10:14:21 -------------------------------------------------------------------------------- Wound Assessment Details Patient Name: Date of Service: [name], [name] 12/25/2021 9:00 [place] [name] Number: [id number] Patient Account Number: [id number] Date of Birth/Sex: Treating RN: 1940-05-05 (  81 y.o. [name] Kalle Bernath: [name] Number Other Clinician: Referring Clemmie Buelna: Treating Johnathin Vanderschaaf/Extender: [name] in Treatment: 0 Wound Status Wound Number: 4 Primary Venous  Leg Ulcer Etiology: Wound Location: Left, Distal, Anterior Lower Leg Secondary Diabetic Wound/Ulcer of the Lower Extremity Wounding Event: Gradually Appeared Etiology: Date Acquired: 12/01/2021 Wound Open [name] Of Treatment: 0 Status: Clustered Wound: Yes Comorbid Cataracts, Arrhythmia, Congestive Heart Failure, Hypertension, History: Type II Diabetes, End Stage Renal Disease, Neuropathy Photos Wound Measurements Length: (cm) 6 Width: (cm) 1.2 Depth: (cm) 0.1 Clustered Quantity: 3 Area: (cm) 5.655 Volume: (cm) 0.565 % Reduction in Area: 0% % Reduction in Volume: 0% Epithelialization: Small (1-33%) Tunneling: No Undermining: No Wound Description Classification: Full Thickness Without Exposed Support Structures Wound Margin: Flat and Intact Exudate Amount: Medium Exudate Type: Serous Exudate Color: amber Foul Odor After Cleansing: No Slough/Fibrino Yes Wound Bed Granulation Amount: Large (67-100%) Exposed Structure Granulation Quality: Red Fascia Exposed: No Necrotic Amount: Small (1-33%) Fat Layer (Subcutaneous Tissue) Exposed: Yes Necrotic Quality: Eschar, Adherent Slough Tendon Exposed: No Muscle Exposed: No Joint Exposed: No Bone Exposed: No Electronic Signature(s) Signed: 12/25/2021 5:36:58 PM By: Baruch Gouty RN, BSN Entered By: Baruch Gouty on 12/25/2021 10:14:43 -------------------------------------------------------------------------------- [place] Details Patient Name: Date of Service: [name] 12/25/2021 9:00 [place] [name] Number: [id number] Patient Account Number: [id number] Date of Birth/Sex: Treating RN: [birth year] (81 y.o. [name] Anuar Walgren: [name] Number Other Clinician: Referring Joycelynn Fritsche: Treating Taliyah Watrous/Extender: [name] in Treatment: 0 Vital Signs Time Taken: 09:26 Temperature (F): 98.3 Height (in): 65 Pulse (bpm): 89 Source: Stated Respiratory Rate  (breaths/min): 18 Weight (lbs): 160 Blood Pressure (mmHg): 122/80 Source: Stated Reference Range: 80 - 120 mg / dl Body Mass Index (BMI): 26.6 Electronic Signature(s) Signed: 12/25/2021 5:36:58 PM By: Baruch Gouty RN, BSN Entered By: Baruch Gouty on 12/25/2021 09:27:39

## 2021-12-25 NOTE — Progress Notes (Signed)
DELLE, ANDRZEJEWSKI ([id number]) Visit Report for 12/25/2021 Abuse Risk Screen Details Patient Name: Date of Service: JIMIA, GENTLES NN 12/25/2021 9:00 [place] Record Number: [id number] Patient Account Number: [id number] Date of Birth/Sex: Treating RN: 1940-06-14 (81 y.o. [name] Primary Care Meghan Warshawsky: [name] Number Other Clinician: Referring Koa Zoeller: Treating Suleyma Wafer/Extender: [name] in Treatment: 0 Abuse Risk Screen Items Answer ABUSE RISK SCREEN: Has anyone close to you tried to hurt or harm you recentlyo No Do you feel uncomfortable with anyone in your familyo No Has anyone forced you do things that you didnt want to doo No Electronic Signature(s) Signed: 12/25/2021 5:36:58 PM By: Baruch Gouty RN, BSN Entered By: Baruch Gouty on 12/25/2021 09:37:48 -------------------------------------------------------------------------------- Activities of Daily Living Details Patient Name: Date of Service: PORTIA, WISDOM NN 12/25/2021 9:00 [place] Record Number: [id number] Patient Account Number: [id number] Date of Birth/Sex: Treating RN: [birth year] (81 y.o. [name] Primary Care Denisse Whitenack: [name] Number Other Clinician: Referring Janit Cutter: Treating Yer Castello/Extender: [name] in Treatment: 0 Activities of Daily Living Items Answer Activities of Daily Living (Please select one for each item) Drive Automobile Not Able T Medications ake Need Assistance Use T elephone Completely Able Care for Appearance Need Assistance Use T oilet Completely Able Bath / Shower Need Assistance Dress Self Need Assistance Feed Self Completely Able Walk Not Able Get In / Out Bed Need Assistance Housework Not Able Prepare Meals Not Able Handle Money Need Assistance Shop for Self Need Assistance Electronic Signature(s) Signed: 12/25/2021 5:36:58 PM By: Baruch Gouty RN, BSN Entered By: Baruch Gouty on  12/25/2021 09:38:44 -------------------------------------------------------------------------------- Education Screening Details Patient Name: Date of Service: CHARISH, SCHROEPFER NN 12/25/2021 9:00 [place] Record Number: [id number] Patient Account Number: [id number] Date of Birth/Sex: Treating RN: [birth year] (81 y.o. [name] Primary Care Zanayah Shadowens: [name] Number Other Clinician: Referring Montarius Kitagawa: Treating Shantella Blubaugh/Extender: [name] in Treatment: 0 Primary Learner Assessed: Patient Learning Preferences/Education Level/Primary Language Learning Preference: Explanation, Demonstration, Printed Material Highest Education Level: College or Above Preferred Language: English Cognitive Barrier Language Barrier: No Translator Needed: No Memory Deficit: No Emotional Barrier: No Cultural/Religious Beliefs Affecting Medical Care: No Physical Barrier Impaired Vision: Yes Glasses Impaired Hearing: No Decreased Hand dexterity: No Knowledge/Comprehension Knowledge Level: High Comprehension Level: High Ability to understand written instructions: High Ability to understand verbal instructions: High Motivation Anxiety Level: Calm Cooperation: Cooperative Education Importance: Acknowledges Need Interest in Health Problems: Asks Questions Perception: Coherent Willingness to Engage in Self-Management High Activities: Readiness to Engage in Self-Management High Activities: Electronic Signature(s) Signed: 12/25/2021 5:36:58 PM By: Baruch Gouty RN, BSN Entered By: Baruch Gouty on 12/25/2021 09:39:18 -------------------------------------------------------------------------------- Fall Risk Assessment Details Patient Name: Date of Service: COTI, BURD A NN 12/25/2021 9:00 [place] Medical Record Number: [id number] Patient Account Number: [id number] Date of Birth/Sex: Treating RN: 1940/09/26 (81 y.o. [name] Primary Care Jolynn Bajorek:  [name] Number Other Clinician: Referring Jerusalen Mateja: Treating Joshoa Shawler/Extender: [name] in Treatment: 0 Fall Risk Assessment Items Have you had 2 or more falls in the last 12 monthso 0 Yes Have you had any fall that resulted in injury in the last 12 monthso 0 No FALLS RISK SCREEN History of falling - immediate or within 3 months 25 Yes Secondary diagnosis (Do you have 2 or more medical diagnoseso) 0 No Ambulatory aid None/bed rest/wheelchair/nurse 0 Yes Crutches/cane/walker 0 No Furniture 0 No Intravenous therapy Access/Saline/Heparin Lock 0 No Gait/Transferring Normal/ bed rest/ wheelchair 0  No Weak (short steps with or without shuffle, stooped but able to lift head while walking, may seek 0 No support from furniture) Impaired (short steps with shuffle, may have difficulty arising from chair, head down, impaired 20 Yes balance) Mental Status Oriented to own ability 0 Yes Electronic Signature(s) Signed: 12/25/2021 5:36:58 PM By: Baruch Gouty RN, BSN Entered By: Baruch Gouty on 12/25/2021 09:39:51 -------------------------------------------------------------------------------- Foot Assessment Details Patient Name: Date of Service: SHAQUANDA, GRAVES NN 12/25/2021 9:00 A M Medical Record Number: [id number] Patient Account Number: [id number] Date of Birth/Sex: Treating RN: 1940/11/21 (81 y.o. [name] Primary Care Yohana Bartha: [name] Number Other Clinician: Referring Taisei Bonnette: Treating Shalaina Guardiola/Extender: [name] in Treatment: 0 Foot Assessment Items Site Locations + = Sensation present, - = Sensation absent, C = Callus, U = Ulcer R = Redness, W = Warmth, M = Maceration, PU = Pre-ulcerative lesion F = Fissure, S = Swelling, D = Dryness Assessment Right: Left: Other Deformity: No No Prior Foot Ulcer: No No Prior Amputation: No No Charcot Joint: No No Ambulatory Status: Non-ambulatory Assistance  Device: Wheelchair Gait: Administrator, arts) Signed: 12/25/2021 5:36:58 PM By: Baruch Gouty RN, BSN Entered By: Baruch Gouty on 12/25/2021 09:49:35 -------------------------------------------------------------------------------- Nutrition Risk Screening Details Patient Name: Date of Service: KASHINA, MECUM NN 12/25/2021 9:00 [place] Record Number: [id number] Patient Account Number: [id number] Date of Birth/Sex: Treating RN: [birth year] (81 y.o. [name] Primary Care Fateh Kindle: [name] Number Other Clinician: Referring Isabel Ardila: Treating Terran Klinke/Extender: [name] in Treatment: 0 Height (in): 65 Weight (lbs): 160 Body Mass Index (BMI): 26.6 Nutrition Risk Screening Items Score Screening NUTRITION RISK SCREEN: I have an illness or condition that made me change the kind and/or amount of food I eat 0 No I eat fewer than two meals per day 0 No I eat few fruits and vegetables, or milk products 0 No I have three or more drinks of beer, liquor or wine almost every day 0 No I have tooth or mouth problems that make it hard for me to eat 0 No I don't always have enough money to buy the food I need 0 No I eat alone most of the time 0 No I take three or more different prescribed or over-the-counter drugs a day 1 Yes Without wanting to, I have lost or gained 10 pounds in the last six months 0 No I am not always physically able to shop, cook and/or feed myself 0 No Nutrition Protocols Good Risk Protocol 0 No interventions needed Moderate Risk Protocol High Risk Proctocol Risk Level: Good Risk Score: 1 Electronic Signature(s) Signed: 12/25/2021 5:36:58 PM By: Baruch Gouty RN, BSN Entered By: Baruch Gouty on 12/25/2021 09:40:17

## 2021-12-28 NOTE — Progress Notes (Signed)
[name], [name] ([id number]) Visit Report for 12/25/2021 Chief Complaint Document Details Patient Name: Date of Service: [name], [name] 12/25/2021 9:00 [place] Record Number: [id number] Patient Account Number: [id number] Date of Birth/Sex: Treating RN: April 10, 1941 (81 y.o. [name] Primary Care Provider: [name] Number Other Clinician: Referring Provider: Treating Provider/Extender: [name] in Treatment: 0 Information Obtained from: Patient Chief Complaint Patient presents for treatment of open ulcers due to venous insufficiency Electronic Signature(s) Signed: 12/25/2021 10:35:30 AM By: [name] MD FACS Entered By: [name] on 12/25/2021 10:35:30 -------------------------------------------------------------------------------- Debridement Details Patient Name: Date of Service: [name], [name] 12/25/2021 9:00 A M Medical Record Number: [id number] Patient Account Number: [id number] Date of Birth/Sex: Treating RN: Jul 13, 1940 (81 y.o. [name] Primary Care Provider: [name] Number Other Clinician: Referring Provider: Treating Provider/Extender: [name] in Treatment: 0 Debridement Performed for Assessment: Wound #1 Right,Anterior Lower Leg Performed By: Physician [name], MD Debridement Type: Debridement Severity of Tissue Pre Debridement: Fat layer exposed Level of Consciousness (Pre-procedure): Awake and Alert Pre-procedure Verification/Time Out Yes - 10:20 Taken: Start Time: 10:20 Pain Control: Lidocaine 4% T opical Solution T Area Debrided (L x W): otal 1.8 (cm) x 1.8 (cm) = 3.24 (cm) Tissue and other material debrided: Non-Viable, Slough, Slough Level: Non-Viable Tissue Debridement Description: Selective/Open Wound Instrument: Curette Bleeding: Minimum Hemostasis Achieved: Pressure Procedural Pain: 0 Post Procedural Pain: 0 Response to Treatment: Procedure was  tolerated well Level of Consciousness (Post- Awake and Alert procedure): Post Debridement Measurements of Total Wound Length: (cm) 1.8 Width: (cm) 1.8 Depth: (cm) 0.1 Volume: (cm) 0.254 Character of Wound/Ulcer Post Debridement: Improved Severity of Tissue Post Debridement: Fat layer exposed Post Procedure Diagnosis Same as Pre-procedure Electronic Signature(s) Signed: 12/25/2021 1:33:14 PM By: [name] MD FACS Signed: 12/25/2021 5:36:58 PM By: Baruch Gouty RN, BSN Entered By: Baruch Gouty on 12/25/2021 10:28:06 -------------------------------------------------------------------------------- Debridement Details Patient Name: Date of Service: [name], [name] 12/25/2021 9:00 [place] Record Number: [id number] Patient Account Number: [id number] Date of Birth/Sex: Treating RN: [birth year] (81 y.o. [name] Primary Care Provider: [name] Number Other Clinician: Referring Provider: Treating Provider/Extender: [name] in Treatment: 0 Debridement Performed for Assessment: Wound #2 Right,Lateral Lower Leg Performed By: Physician [name], MD Debridement Type: Debridement Severity of Tissue Pre Debridement: Fat layer exposed Level of Consciousness (Pre-procedure): Awake and Alert Pre-procedure Verification/Time Out Yes - 10:20 Taken: Start Time: 10:20 Pain Control: Lidocaine 4% T opical Solution T Area Debrided (L x W): otal 0.7 (cm) x 0.8 (cm) = 0.56 (cm) Tissue and other material debrided: Non-Viable, Slough, Slough Level: Non-Viable Tissue Debridement Description: Selective/Open Wound Instrument: Curette Bleeding: Minimum Hemostasis Achieved: Pressure Procedural Pain: 0 Post Procedural Pain: 0 Response to Treatment: Procedure was tolerated well Level of Consciousness (Post- Awake and Alert procedure): Post Debridement Measurements of Total Wound Length: (cm) 0.7 Width: (cm) 0.8 Depth: (cm) 0.1 Volume:  (cm) 0.044 Character of Wound/Ulcer Post Debridement: Improved Severity of Tissue Post Debridement: Fat layer exposed Post Procedure Diagnosis Same as Pre-procedure Electronic Signature(s) Signed: 12/25/2021 1:33:14 PM By: [name] MD FACS Signed: 12/25/2021 5:36:58 PM By: Baruch Gouty RN, BSN Entered By: Baruch Gouty on 12/25/2021 10:28:43 -------------------------------------------------------------------------------- Debridement Details Patient Name: Date of Service: [name], [name] 12/25/2021 9:00 A M Medical Record Number: [id number] Patient Account Number: [id number] Date of Birth/Sex: Treating RN: [birth year] (81 y.o. [name] Primary Care Provider: [name] Number Other Clinician: Referring Provider: Treating  Provider/Extender: Lenice Pressman Weeks in Treatment: 0 Debridement Performed for Assessment: Wound #3 Left,Anterior Lower Leg Performed By: Physician [name], MD Debridement Type: Debridement Severity of Tissue Pre Debridement: Fat layer exposed Level of Consciousness (Pre-procedure): Awake and Alert Pre-procedure Verification/Time Out Yes - 10:20 Taken: Start Time: 10:20 Pain Control: Lidocaine 4% T opical Solution T Area Debrided (L x W): otal 3 (cm) x 1.3 (cm) = 3.9 (cm) Tissue and other material debrided: Non-Viable, Slough, Slough Level: Non-Viable Tissue Debridement Description: Selective/Open Wound Instrument: Curette Bleeding: Minimum Hemostasis Achieved: Pressure Procedural Pain: 0 Post Procedural Pain: 0 Response to Treatment: Procedure was tolerated well Level of Consciousness (Post- Awake and Alert procedure): Post Debridement Measurements of Total Wound Length: (cm) 4.1 Width: (cm) 1.3 Depth: (cm) 0.1 Volume: (cm) 0.419 Character of Wound/Ulcer Post Debridement: Improved Severity of Tissue Post Debridement: Fat layer exposed Post Procedure Diagnosis Same as Pre-procedure Electronic  Signature(s) Signed: 12/25/2021 1:33:14 PM By: [name] MD FACS Signed: 12/25/2021 5:36:58 PM By: Baruch Gouty RN, BSN Entered By: Baruch Gouty on 12/25/2021 10:29:16 -------------------------------------------------------------------------------- Debridement Details Patient Name: Date of Service: [name], [name] 12/25/2021 9:00 [place] Record Number: [id number] Patient Account Number: [id number] Date of Birth/Sex: Treating RN: Dec 31, 1940 (81 y.o. [name] Primary Care Provider: [name] Number Other Clinician: Referring Provider: Treating Provider/Extender: [name] in Treatment: 0 Debridement Performed for Assessment: Wound #4 Left,Distal,Anterior Lower Leg Performed By: Physician [name], MD Debridement Type: Debridement Severity of Tissue Pre Debridement: Fat layer exposed Level of Consciousness (Pre-procedure): Awake and Alert Pre-procedure Verification/Time Out Yes - 10:20 Taken: Start Time: 10:20 Pain Control: Lidocaine 4% T opical Solution T Area Debrided (L x W): otal 1 (cm) x 1.2 (cm) = 1.2 (cm) Tissue and other material debrided: Non-Viable, Slough, Slough Level: Non-Viable Tissue Debridement Description: Selective/Open Wound Instrument: Curette Bleeding: Minimum Hemostasis Achieved: Pressure Procedural Pain: 0 Post Procedural Pain: 0 Response to Treatment: Procedure was tolerated well Level of Consciousness (Post- Awake and Alert procedure): Post Debridement Measurements of Total Wound Length: (cm) 1 Width: (cm) 1.2 Depth: (cm) 0.1 Volume: (cm) 0.094 Character of Wound/Ulcer Post Debridement: Improved Severity of Tissue Post Debridement: Fat layer exposed Post Procedure Diagnosis Same as Pre-procedure Electronic Signature(s) Signed: 12/25/2021 1:33:14 PM By: [name] MD FACS Signed: 12/25/2021 5:36:58 PM By: Baruch Gouty RN, BSN Entered By: Baruch Gouty on 12/25/2021  10:30:11 -------------------------------------------------------------------------------- HPI Details Patient Name: Date of Service: [name], [name] 12/25/2021 9:00 A M Medical Record Number: [id number] Patient Account Number: [id number] Date of Birth/Sex: Treating RN: [birth year] (81 y.o. [name], [name] Primary Care Provider: [name] Number Other Clinician: Referring Provider: Treating Provider/Extender: [name] in Treatment: 0 History of Present Illness HPI Description: ADMISSION 12/25/2021 This is an 81 year old woman with multiple medical comorbidities including congestive heart failure, hypertension, end-stage renal disease on hemodialysis, polyneuropathy, poorly controlled type 2 diabetes mellitus (last hemoglobin A1c available to me is from December 2022 and was 9.6). She presents to clinic today with multiple open ulcers on her bilateral lower extremities in the setting of severe edema. She says that she has compression stockings but has not worn them in some time because they are too tight. Her wounds are secondary to trauma from her wheelchair. She often sleeps in her wheelchair with her legs hanging down. ABIs in clinic today were noncompressible on the right and 0.57 on the left. She has multiple shallow ulcers on the bilateral lower extremities. She has  2+ pitting edema and her legs are extremely tense. They are somewhat erythematous and warm. She is currently taking cephalexin prescribed by her primary care provider due to concern for cellulitis. Electronic Signature(s) Signed: 12/25/2021 10:41:14 AM By: [name] MD FACS Entered By: [name] on 12/25/2021 10:41:14 -------------------------------------------------------------------------------- Physical Exam Details Patient Name: Date of Service: [name], [name] 12/25/2021 9:00 A M Medical Record Number: [id number] Patient Account Number: [id number] Date of  Birth/Sex: Treating RN: 30-Oct-1940 (81 y.o. [name] Primary Care Provider: [name] Number Other Clinician: Referring Provider: Treating Provider/Extender: [name] in Treatment: 0 Constitutional . . . . No acute distress. Respiratory Normal work of breathing on room air.. Cardiovascular Distal pulses nonpalpable. 2+ pitting edema to the knees bilaterally. Notes 12/25/2021: She has multiple shallow ulcers on the bilateral lower extremities. She has 2+ pitting edema and her legs are extremely tense. They are somewhat erythematous and warm. Electronic Signature(s) Signed: 12/25/2021 10:42:50 AM By: [name] MD FACS Entered By: [name] on 12/25/2021 10:42:50 -------------------------------------------------------------------------------- Physician Orders Details Patient Name: Date of Service: [name], [name] 12/25/2021 9:00 A M Medical Record Number: [id number] Patient Account Number: [id number] Date of Birth/Sex: Treating RN: 18-Jan-1941 (81 y.o. [name] Primary Care Provider: [name] Number Other Clinician: Referring Provider: Treating Provider/Extender: [name] in Treatment: 0 Verbal / Phone Orders: No Diagnosis Coding ICD-10 Coding Code Description [phone] Non-pressure chronic ulcer of other part of left lower leg with fat layer exposed L97.812 Non-pressure chronic ulcer of other part of right lower leg with fat layer exposed N18.6 End stage renal disease Z99.2 Dependence on renal dialysis E11.622 Type 2 diabetes mellitus with other skin ulcer W29.93 Chronic diastolic (congestive) heart failure I87.2 Venous insufficiency (chronic) (peripheral) Follow-up Appointments ppointment in 1 week. - Dr. [name] RM 1 with Vaughan Basta Return A Friday 9/1 @ 12:30 pm Anesthetic Wound #1 Right,Anterior Lower Leg (In clinic) Topical Lidocaine 4% applied to wound bed Wound #2 Right,Lateral  Lower Leg (In clinic) Topical Lidocaine 4% applied to wound bed Wound #3 Left,Anterior Lower Leg (In clinic) Topical Lidocaine 4% applied to wound bed Wound #4 Left,Distal,Anterior Lower Leg (In clinic) Topical Lidocaine 4% applied to wound bed Bathing/ Shower/ Hygiene May shower with protection but do not get wound dressing(s) wet. Edema Control - Lymphedema / SCD / Other Bilateral Lower Extremities Elevate legs to the level of the heart or above for 30 minutes daily and/or when sitting, a frequency of: - throughout the day, especially at night Exercise regularly Wound Treatment Wound #1 - Lower Leg Wound Laterality: Right, Anterior Peri-Wound Care: Sween Lotion (Moisturizing lotion) 1 x Per Week/30 Days Discharge Instructions: Apply moisturizing lotion as directed Prim Dressing: KerraCel Ag Gelling Fiber Dressing, 2x2 in (silver alginate) 1 x Per Week/30 Days ary Discharge Instructions: Apply silver alginate to wound bed as instructed Secondary Dressing: ABD Pad, 8x10 1 x Per Week/30 Days Discharge Instructions: Apply over primary dressing as directed. Compression Wrap: Kerlix Roll 4.5x3.1 (in/yd) 1 x Per Week/30 Days Discharge Instructions: Apply Kerlix and Coban compression as directed. Compression Wrap: Coban Self-Adherent Wrap 4x5 (in/yd) 1 x Per Week/30 Days Discharge Instructions: Apply over Kerlix as directed. Wound #2 - Lower Leg Wound Laterality: Right, Lateral Peri-Wound Care: Sween Lotion (Moisturizing lotion) 1 x Per Week/30 Days Discharge Instructions: Apply moisturizing lotion as directed Prim Dressing: KerraCel Ag Gelling Fiber Dressing, 2x2 in (silver alginate) 1 x Per Week/30 Days ary Discharge Instructions: Apply silver alginate to  wound bed as instructed Secondary Dressing: ABD Pad, 8x10 1 x Per Week/30 Days Discharge Instructions: Apply over primary dressing as directed. Compression Wrap: Kerlix Roll 4.5x3.1 (in/yd) 1 x Per Week/30 Days Discharge Instructions:  Apply Kerlix and Coban compression as directed. Compression Wrap: Coban Self-Adherent Wrap 4x5 (in/yd) 1 x Per Week/30 Days Discharge Instructions: Apply over Kerlix as directed. Wound #3 - Lower Leg Wound Laterality: Left, Anterior Peri-Wound Care: Sween Lotion (Moisturizing lotion) 1 x Per Week/30 Days Discharge Instructions: Apply moisturizing lotion as directed Prim Dressing: KerraCel Ag Gelling Fiber Dressing, 2x2 in (silver alginate) 1 x Per Week/30 Days ary Discharge Instructions: Apply silver alginate to wound bed as instructed Secondary Dressing: ABD Pad, 8x10 1 x Per Week/30 Days Discharge Instructions: Apply over primary dressing as directed. Compression Wrap: Kerlix Roll 4.5x3.1 (in/yd) 1 x Per Week/30 Days Discharge Instructions: Apply Kerlix and Coban compression as directed. Compression Wrap: Coban Self-Adherent Wrap 4x5 (in/yd) 1 x Per Week/30 Days Discharge Instructions: Apply over Kerlix as directed. Wound #4 - Lower Leg Wound Laterality: Left, Anterior, Distal Peri-Wound Care: Sween Lotion (Moisturizing lotion) 1 x Per Week/30 Days Discharge Instructions: Apply moisturizing lotion as directed Prim Dressing: KerraCel Ag Gelling Fiber Dressing, 2x2 in (silver alginate) 1 x Per Week/30 Days ary Discharge Instructions: Apply silver alginate to wound bed as instructed Secondary Dressing: ABD Pad, 8x10 1 x Per Week/30 Days Discharge Instructions: Apply over primary dressing as directed. Compression Wrap: Kerlix Roll 4.5x3.1 (in/yd) 1 x Per Week/30 Days Discharge Instructions: Apply Kerlix and Coban compression as directed. Compression Wrap: Coban Self-Adherent Wrap 4x5 (in/yd) 1 x Per Week/30 Days Discharge Instructions: Apply over Kerlix as directed. Services and Therapies rterial Studies- Bilateral, ABIs and TBIs - bilateral lower extremity edema with nonhealing ulcers bilateral lower legs - (ICD10 I87.2 - Venous A insufficiency (chronic) (peripheral)) Patient  Medications llergies: No Known Allergies A Notifications Medication Indication Start End prior to debridement 12/25/2021 lidocaine DOSE topical 4 % cream - cream topical Electronic Signature(s) Signed: 12/25/2021 5:36:58 PM By: Baruch Gouty RN, BSN Signed: 12/28/2021 9:27:25 AM By: [name] MD FACS Previous Signature: 12/25/2021 1:33:14 PM Version By: [name] MD FACS Entered By: Baruch Gouty on 12/25/2021 17:00:49 Prescription 12/25/2021 -------------------------------------------------------------------------------- Eugenio Hoes MD Patient Name: Provider: 30-Sep-1940 [id number] Date of Birth: NPI#Wanda Plump [id number] Sex: DEA #: [phone] Phone #: License #: [place] Patient Address: [address] [place], [place] [address] [place], [place] [address] [phone] Allergies No Known Allergies Provider's Orders rterial Studies- Bilateral, ABIs and TBIs - ICD10: I87.2 - bilateral lower extremity edema with nonhealing ulcers bilateral lower legs A Hand Signature: Date(s): Electronic Signature(s) Signed: 12/25/2021 5:36:58 PM By: Baruch Gouty RN, BSN Signed: 12/28/2021 9:27:25 AM By: [name] MD FACS Entered By: Baruch Gouty on 12/25/2021 17:00:49 -------------------------------------------------------------------------------- Problem List Details Patient Name: Date of Service: [name], [name] 12/25/2021 9:00 A M Medical Record Number: [id number] Patient Account Number: [id number] Date of Birth/Sex: Treating RN: 1940/09/14 (81 y.o. [name], [name] Primary Care Provider: [name] Number Other Clinician: Referring Provider: Treating Provider/Extender: [name] in Treatment: 0 Active Problems ICD-10 Encounter Code Description Active Date MDM Diagnosis [phone] Non-pressure chronic ulcer of other part of left lower  leg with fat layer exposed8/25/2023 No Yes L97.812 Non-pressure chronic ulcer of other part of right lower leg with fat layer 12/25/2021 No Yes exposed N18.6 End stage renal disease 12/25/2021 No Yes Z99.2  Dependence on renal dialysis 12/25/2021 No Yes E11.622 Type 2 diabetes mellitus with other skin ulcer 12/25/2021 No Yes D32.20 Chronic diastolic (congestive) heart failure 12/25/2021 No Yes I87.2 Venous insufficiency (chronic) (peripheral) 12/25/2021 No Yes Inactive Problems Resolved Problems Electronic Signature(s) Signed: 12/25/2021 10:35:04 AM By: [name] MD FACS Previous Signature: 12/25/2021 10:17:47 AM Version By: [name] MD FACS Entered By: [name] on 12/25/2021 10:35:04 -------------------------------------------------------------------------------- Progress Note Details Patient Name: Date of Service: [name], HORWITZ A Marquez 12/25/2021 9:00 [place] Record Number: [id number] Patient Account Number: [id number] Date of Birth/Sex: Treating RN: 09-09-1940 (81 y.o. [name] Primary Care Provider: [name] Number Other Clinician: Referring Provider: Treating Provider/Extender: [name] in Treatment: 0 Subjective Chief Complaint Information obtained from Patient Patient presents for treatment of open ulcers due to venous insufficiency History of Present Illness (HPI) ADMISSION 12/25/2021 This is an 81 year old woman with multiple medical comorbidities including congestive heart failure, hypertension, end-stage renal disease on hemodialysis, polyneuropathy, poorly controlled type 2 diabetes mellitus (last hemoglobin A1c available to me is from December 2022 and was 9.6). She presents to clinic today with multiple open ulcers on her bilateral lower extremities in the setting of severe edema. She says that she has compression stockings but has not worn them in some time because they are too tight. Her wounds are secondary to  trauma from her wheelchair. She often sleeps in her wheelchair with her legs hanging down. ABIs in clinic today were noncompressible on the right and 0.57 on the left. She has multiple shallow ulcers on the bilateral lower extremities. She has 2+ pitting edema and her legs are extremely tense. They are somewhat erythematous and warm. She is currently taking cephalexin prescribed by her primary care provider due to concern for cellulitis. Patient History Information obtained from Patient, Caregiver, Chart. Allergies No Known Allergies Family History Heart Disease - Mother,Father, Hypertension - Mother,Father, No family history of Cancer, Diabetes, Hereditary Spherocytosis, Kidney Disease, Lung Disease, Seizures, Stroke, Thyroid Problems, Tuberculosis. Social History Never smoker, Marital Status - Divorced, Alcohol Use - Never, Drug Use - No History, Caffeine Use - Daily. Medical History Eyes Patient has history of Cataracts - bil removed Denies history of Glaucoma, Optic Neuritis Hematologic/Lymphatic Denies history of Anemia Cardiovascular Patient has history of Arrhythmia - afib, Congestive Heart Failure, Hypertension Endocrine Patient has history of Type II Diabetes Denies history of Type I Diabetes Genitourinary Patient has history of End Stage Renal Disease - HD Integumentary (Skin) Denies history of History of Burn Neurologic Patient has history of Neuropathy Oncologic Denies history of Received Chemotherapy, Received Radiation Psychiatric Denies history of Anorexia/bulimia, Confinement Anxiety Patient is treated with Insulin. Blood sugar is tested. Hospitalization/Surgery History - AV fistula plaement left arm. - tonsillectomy. Medical A Surgical History Notes nd Constitutional Symptoms (General Health) vitamin D deficiency Cardiovascular hyperlipidemia Review of Systems (ROS) Constitutional Symptoms (General Health) Denies complaints or symptoms of Fatigue, Fever,  Chills, Marked Weight Change. Eyes Complains or has symptoms of Glasses / Contacts. Denies complaints or symptoms of Dry Eyes, Vision Changes. Ear/Nose/Mouth/Throat Denies complaints or symptoms of Chronic sinus problems or rhinitis. Respiratory Denies complaints or symptoms of Chronic or frequent coughs, Shortness of Breath. Cardiovascular Denies complaints or symptoms of Chest pain. Gastrointestinal Denies complaints or symptoms of Frequent diarrhea, Nausea, Vomiting. Endocrine Denies complaints or symptoms of Heat/cold intolerance. Genitourinary Denies complaints or symptoms of Frequent urination. Integumentary (Skin) Complains or has symptoms of Wounds - bil lower legs. Musculoskeletal Denies complaints or symptoms  of Muscle Pain, Muscle Weakness. Neurologic Denies complaints or symptoms of Numbness/parasthesias. Psychiatric Denies complaints or symptoms of Claustrophobia, Suicidal. Objective Constitutional No acute distress. Vitals Time Taken: 9:26 AM, Height: 65 in, Source: Stated, Weight: 160 lbs, Source: Stated, BMI: 26.6, Temperature: 98.3 F, Pulse: 89 bpm, Respiratory Rate: 18 breaths/min, Blood Pressure: 122/80 mmHg. Respiratory Normal work of breathing on room air.. Cardiovascular Distal pulses nonpalpable. 2+ pitting edema to the knees bilaterally. General Notes: 12/25/2021: She has multiple shallow ulcers on the bilateral lower extremities. She has 2+ pitting edema and her legs are extremely tense. They are somewhat erythematous and warm. Integumentary (Hair, Skin) Wound #1 status is Open. Original cause of wound was Trauma. The date acquired was: 12/18/2021. The wound is located on the Right,Anterior Lower Leg. The wound measures 1.8cm length x 1.8cm width x 0.1cm depth; 2.545cm^2 area and 0.254cm^3 volume. There is Fat Layer (Subcutaneous Tissue) exposed. There is no tunneling or undermining noted. There is a medium amount of serous drainage noted. The wound  margin is flat and intact. There is large (67-100%) red granulation within the wound bed. There is a small (1-33%) amount of necrotic tissue within the wound bed including Adherent Slough. Wound #2 status is Open. Original cause of wound was Trauma. The date acquired was: 12/11/2021. The wound is located on the Right,Lateral Lower Leg. The wound measures 0.7cm length x 0.8cm width x 0.1cm depth; 0.44cm^2 area and 0.044cm^3 volume. There is Fat Layer (Subcutaneous Tissue) exposed. There is no tunneling or undermining noted. There is a medium amount of serous drainage noted. The wound margin is flat and intact. There is large (67-100%) red granulation within the wound bed. There is no necrotic tissue within the wound bed. Wound #3 status is Open. Original cause of wound was Trauma. The date acquired was: 12/01/2021. The wound is located on the Left,Anterior Lower Leg. The wound measures 4.1cm length x 1.3cm width x 0.1cm depth; 4.186cm^2 area and 0.419cm^3 volume. There is Fat Layer (Subcutaneous Tissue) exposed. There is no tunneling or undermining noted. There is a large amount of serous drainage noted. The wound margin is flat and intact. There is large (67-100%) red granulation within the wound bed. There is a small (1-33%) amount of necrotic tissue within the wound bed including Adherent Slough. Wound #4 status is Open. Original cause of wound was Gradually Appeared. The date acquired was: 12/01/2021. The wound is located on the [facility] Lower Leg. The wound measures 6cm length x 1.2cm width x 0.1cm depth; 5.655cm^2 area and 0.565cm^3 volume. There is Fat Layer (Subcutaneous Tissue) exposed. There is no tunneling or undermining noted. There is a medium amount of serous drainage noted. The wound margin is flat and intact. There is large (67-100%) red granulation within the wound bed. There is a small (1-33%) amount of necrotic tissue within the wound bed including Eschar and Adherent  Slough. 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 There was a Selective/Open Wound Non-Viable Tissue Debridement with a total area of 3.24 sq cm performed by [name], MD.  With the following instrument(s): Curette to remove Non-Viable tissue/material. Material removed includes [facility] after achieving pain control using Lidocaine 4% Topical Solution. No specimens were taken. A time out was conducted at 10:20, prior to the start of the procedure. A Minimum amount of bleeding was controlled with Pressure. The procedure was tolerated well with a pain level of 0 throughout and a pain level of 0 following the procedure. Post Debridement Measurements: 1.8cm length x 1.8cm width x 0.1cm depth; 0.254cm^3 volume. Character of Wound/Ulcer Post Debridement is improved. Severity of Tissue Post Debridement is: Fat layer exposed. Post procedure Diagnosis Wound #1: Same as Pre-Procedure Wound #2 Pre-procedure diagnosis of Wound #2 is a Venous Leg Ulcer located on the Right,Lateral Lower Leg .Severity of Tissue Pre Debridement is: Fat layer exposed. There was a Selective/Open Wound Non-Viable Tissue Debridement with a total area of 0.56 sq cm performed by [name], MD. With the following instrument(s): Curette to remove Non-Viable tissue/material. Material removed includes [facility] after achieving pain control using Lidocaine 4% Topical Solution. No specimens were taken. A time out  was conducted at 10:20, prior to the start of the procedure. A Minimum amount of bleeding was controlled with Pressure. The procedure was tolerated well with a pain level of 0 throughout and a pain level of 0 following the procedure. Post Debridement Measurements: 0.7cm length x 0.8cm width x 0.1cm depth; 0.044cm^3 volume. Character of Wound/Ulcer Post Debridement is improved. Severity of Tissue Post Debridement is: Fat layer exposed. Post procedure Diagnosis Wound #2: Same as Pre-Procedure Wound #3 Pre-procedure diagnosis of Wound #3 is a Venous Leg Ulcer located on the Left,Anterior Lower Leg .Severity of Tissue Pre Debridement is: Fat layer exposed. There was a Selective/Open Wound Non-Viable Tissue Debridement with a total area of 3.9 sq cm performed by [name], MD. With the following instrument(s): Curette to remove Non-Viable tissue/material. Material removed includes [facility] after achieving pain control using Lidocaine 4% Topical Solution. No specimens were taken. A time out was conducted at 10:20, prior to the start of the procedure. A Minimum amount of bleeding was controlled with Pressure. The procedure was tolerated well with a pain level of 0 throughout and a pain level of 0 following the procedure. Post Debridement Measurements: 4.1cm length x 1.3cm width x 0.1cm depth; 0.419cm^3 volume. Character of Wound/Ulcer Post Debridement is improved. Severity of Tissue Post Debridement is: Fat layer exposed. Post procedure Diagnosis Wound #3: Same as Pre-Procedure Wound #4 Pre-procedure diagnosis of Wound #4 is a Venous Leg Ulcer located on the Left,Distal,Anterior Lower Leg .Severity of Tissue Pre Debridement is: Fat layer exposed. There was a Selective/Open Wound Non-Viable Tissue Debridement with a total area of 1.2 sq cm performed by [name], MD. With the following instrument(s): Curette to remove Non-Viable tissue/material. Material removed includes [facility] after achieving  pain control using Lidocaine 4% Topical Solution. No specimens were taken. A time out was conducted at 10:20, prior to the start of the procedure. A Minimum amount of bleeding was controlled with Pressure. The procedure was tolerated well with a pain level of 0 throughout and a pain level of 0 following the procedure. Post Debridement Measurements: 1cm length x 1.2cm width x 0.1cm depth; 0.094cm^3 volume. Character of Wound/Ulcer Post Debridement is improved. Severity of Tissue Post Debridement is: Fat layer exposed. Post procedure Diagnosis Wound #4: Same as Pre-Procedure Plan Follow-up Appointments: Return Appointment in 1 week. - Dr. [name] RM 1 with [facility] Friday 9/1 @ 12:30 pm Anesthetic: Wound #1 Right,Anterior Lower Leg: (  In clinic) Topical Lidocaine 4% applied to wound bed Wound #2 Right,Lateral Lower Leg: (In clinic) Topical Lidocaine 4% applied to wound bed Wound #3 Left,Anterior Lower Leg: (In clinic) Topical Lidocaine 4% applied to wound bed Wound #4 Left,Distal,Anterior Lower Leg: (In clinic) Topical Lidocaine 4% applied to wound bed Bathing/ Shower/ Hygiene: May shower with protection but do not get wound dressing(s) wet. Edema Control - Lymphedema / SCD / Other: Elevate legs to the level of the heart or above for 30 minutes daily and/or when sitting, a frequency of: - throughout the day, especially at night Exercise regularly Services and Therapies ordered were: Arterial Studies- Bilateral, ABIs and TBIs - bilateral lower extremity edema with nonhealing ulcers bilateral lower legs The following medication(s) was prescribed: lidocaine topical 4 % cream cream topical for prior to debridement was prescribed at facility WOUND #1: - Lower Leg Wound Laterality: Right, Anterior Peri-Wound Care: Sween Lotion (Moisturizing lotion) 1 x Per Week/30 Days Discharge Instructions: Apply moisturizing lotion as directed Prim Dressing: KerraCel Ag Gelling Fiber Dressing, 2x2 in (silver  alginate) 1 x Per Week/30 Days ary Discharge Instructions: Apply silver alginate to wound bed as instructed Secondary Dressing: ABD Pad, 8x10 1 x Per Week/30 Days Discharge Instructions: Apply over primary dressing as directed. Com pression Wrap: Kerlix Roll 4.5x3.1 (in/yd) 1 x Per Week/30 Days Discharge Instructions: Apply Kerlix and Coban compression as directed. Com pression Wrap: Coban Self-Adherent Wrap 4x5 (in/yd) 1 x Per Week/30 Days Discharge Instructions: Apply over Kerlix as directed. WOUND #2: - Lower Leg Wound Laterality: Right, Lateral Peri-Wound Care: Sween Lotion (Moisturizing lotion) 1 x Per Week/30 Days Discharge Instructions: Apply moisturizing lotion as directed Prim Dressing: KerraCel Ag Gelling Fiber Dressing, 2x2 in (silver alginate) 1 x Per Week/30 Days ary Discharge Instructions: Apply silver alginate to wound bed as instructed Secondary Dressing: ABD Pad, 8x10 1 x Per Week/30 Days Discharge Instructions: Apply over primary dressing as directed. Com pression Wrap: Kerlix Roll 4.5x3.1 (in/yd) 1 x Per Week/30 Days Discharge Instructions: Apply Kerlix and Coban compression as directed. Com pression Wrap: Coban Self-Adherent Wrap 4x5 (in/yd) 1 x Per Week/30 Days Discharge Instructions: Apply over Kerlix as directed. WOUND #3: - Lower Leg Wound Laterality: Left, Anterior Peri-Wound Care: Sween Lotion (Moisturizing lotion) 1 x Per Week/30 Days Discharge Instructions: Apply moisturizing lotion as directed Prim Dressing: KerraCel Ag Gelling Fiber Dressing, 2x2 in (silver alginate) 1 x Per Week/30 Days ary Discharge Instructions: Apply silver alginate to wound bed as instructed Secondary Dressing: ABD Pad, 8x10 1 x Per Week/30 Days Discharge Instructions: Apply over primary dressing as directed. Com pression Wrap: Kerlix Roll 4.5x3.1 (in/yd) 1 x Per Week/30 Days Discharge Instructions: Apply Kerlix and Coban compression as directed. Com pression Wrap: Coban Self-Adherent  Wrap 4x5 (in/yd) 1 x Per Week/30 Days Discharge Instructions: Apply over Kerlix as directed. WOUND #4: - Lower Leg Wound Laterality: Left, Anterior, Distal Peri-Wound Care: Sween Lotion (Moisturizing lotion) 1 x Per Week/30 Days Discharge Instructions: Apply moisturizing lotion as directed Prim Dressing: KerraCel Ag Gelling Fiber Dressing, 2x2 in (silver alginate) 1 x Per Week/30 Days ary Discharge Instructions: Apply silver alginate to wound bed as instructed Secondary Dressing: ABD Pad, 8x10 1 x Per Week/30 Days Discharge Instructions: Apply over primary dressing as directed. Com pression Wrap: Kerlix Roll 4.5x3.1 (in/yd) 1 x Per Week/30 Days Discharge Instructions: Apply Kerlix and Coban compression as directed. Com pression Wrap: Coban Self-Adherent Wrap 4x5 (in/yd) 1 x Per Week/30 Days Discharge Instructions: Apply over Kerlix as directed. 12/25/2021:  This is an 81 year old type II diabetic with congestive heart failure and end-stage renal disease on hemodialysis who also has what appears to be venous insufficiency and significant lower extremity edema that has resulted in wounds. She has multiple shallow ulcers on the bilateral lower extremities. She has 2+ pitting edema and her legs are extremely tense. They are somewhat erythematous and warm. I used a curette to debride slough from the open wound surfaces. We will apply silver alginate with Kerlix and Coban wraps. She should complete the course of cephalexin prescribed by her PCP. We will also send her for arterial studies due to poor ABIs in clinic. Follow-up in 1 week. Electronic Signature(s) Signed: 12/25/2021 5:36:58 PM By: Baruch Gouty RN, BSN Signed: 12/28/2021 9:27:25 AM By: [name] MD FACS Previous Signature: 12/25/2021 10:44:31 AM Version By: [name] MD FACS Entered By: Baruch Gouty on 12/25/2021 17:17:30 -------------------------------------------------------------------------------- HxROS  Details Patient Name: Date of Service: JOYANNA, KLEMAN Marquez 12/25/2021 9:00 [place] Record Number: [id number] Patient Account Number: [id number] Date of Birth/Sex: Treating RN: 19-Dec-1940 (81 y.o. [name] Primary Care Provider: [name] Number Other Clinician: Referring Provider: Treating Provider/Extender: [name] in Treatment: 0 Information Obtained From Patient Caregiver Chart Constitutional Symptoms (General Health) Complaints and Symptoms: Negative for: Fatigue; Fever; Chills; Marked Weight Change Medical History: Past Medical History Notes: vitamin D deficiency Eyes Complaints and Symptoms: Positive for: Glasses / Contacts Negative for: Dry Eyes; Vision Changes Medical History: Positive for: Cataracts - bil removed Negative for: Glaucoma; Optic Neuritis Ear/Nose/Mouth/Throat Complaints and Symptoms: Negative for: Chronic sinus problems or rhinitis Respiratory Complaints and Symptoms: Negative for: Chronic or frequent coughs; Shortness of Breath Cardiovascular Complaints and Symptoms: Negative for: Chest pain Medical History: Positive for: Arrhythmia - afib; Congestive Heart Failure; Hypertension Past Medical History Notes: hyperlipidemia Gastrointestinal Complaints and Symptoms: Negative for: Frequent diarrhea; Nausea; Vomiting Endocrine Complaints and Symptoms: Negative for: Heat/cold intolerance Medical History: Positive for: Type II Diabetes Negative for: Type I Diabetes Time with diabetes: approx 6 yrs Treated with: Insulin Blood sugar tested every day: Yes Tested : 2 times per day Genitourinary Complaints and Symptoms: Negative for: Frequent urination Medical History: Positive for: End Stage Renal Disease - HD Integumentary (Skin) Complaints and Symptoms: Positive for: Wounds - bil lower legs Medical History: Negative for: History of Burn Musculoskeletal Complaints and Symptoms: Negative for: Muscle  Pain; Muscle Weakness Neurologic Complaints and Symptoms: Negative for: Numbness/parasthesias Medical History: Positive for: Neuropathy Psychiatric Complaints and Symptoms: Negative for: Claustrophobia; Suicidal Medical History: Negative for: Anorexia/bulimia; Confinement Anxiety Hematologic/Lymphatic Medical History: Negative for: Anemia Immunological Oncologic Medical History: Negative for: Received Chemotherapy; Received Radiation HBO Extended History Items Eyes: Cataracts Immunizations Pneumococcal Vaccine: Received Pneumococcal Vaccination: No Implantable Devices No devices added Hospitalization / Surgery History Type of Hospitalization/Surgery AV fistula plaement left arm tonsillectomy Family and Social History Cancer: No; Diabetes: No; Heart Disease: Yes - Mother,Father; Hereditary Spherocytosis: No; Hypertension: Yes - Mother,Father; Kidney Disease: No; Lung Disease: No; Seizures: No; Stroke: No; Thyroid Problems: No; Tuberculosis: No; Never smoker; Marital Status - Divorced; Alcohol Use: Never; Drug Use: No History; Caffeine Use: Daily; Financial Concerns: No; Food, Clothing or Shelter Needs: No; Support System Lacking: No; Transportation Concerns: No Engineer, maintenance) Signed: 12/25/2021 1:33:14 PM By: [name] MD FACS Signed: 12/25/2021 5:36:58 PM By: Baruch Gouty RN, BSN Entered By: Baruch Gouty on 12/25/2021 09:37:33 -------------------------------------------------------------------------------- [place] Details Patient Name: Date of Service: MIQUELA, COSTABILE Marquez 12/25/2021 Medical Record Number: [id number] Patient Account Number:  [id number] Date of Birth/Sex: Treating RN: January 27, 1941 (81 y.o. [name] Primary Care Provider: [name] Number Other Clinician: Referring Provider: Treating Provider/Extender: [name] in Treatment: 0 Diagnosis Coding ICD-10 Codes Code Description [phone] Non-pressure  chronic ulcer of other part of left lower leg with fat layer exposed L97.812 Non-pressure chronic ulcer of other part of right lower leg with fat layer exposed N18.6 End stage renal disease Z99.2 Dependence on renal dialysis E11.622 Type 2 diabetes mellitus with other skin ulcer X45.03 Chronic diastolic (congestive) heart failure I87.2 Venous insufficiency (chronic) (peripheral) Facility Procedures CPT4 Code: [id number] Description: 99213 - WOUND CARE VISIT-LEV 3 EST PT Modifier: Quantity: 1 CPT4 Code: [id number] Description: 56979 - DEBRIDE WOUND 1ST 20 SQ CM OR < ICD-10 Diagnosis Description L97.822 Non-pressure chronic ulcer of other part of left lower leg with fat layer expose L97.812 Non-pressure chronic ulcer of other part of right lower leg with fat layer  expos Modifier: d ed Quantity: 1 Physician Procedures : CPT4 Code Description Modifier [id number] - WC PHYS LEVEL 4 - NEW PT 25 ICD-10 Diagnosis Description L97.822 Non-pressure chronic ulcer of other part of left lower leg with fat layer exposed L97.812 Non-pressure chronic ulcer of other part of right  lower leg with fat layer exposed N18.6 End stage renal disease E11.622 Type 2 diabetes mellitus with other skin ulcer Quantity: 1 : [id number] - WC PHYS DEBR WO ANESTH 20 SQ CM ICD-10 Diagnosis Description L97.822 Non-pressure chronic ulcer of other part of left lower leg with fat layer exposed L97.812 Non-pressure chronic ulcer of other part of right lower leg with fat layer  exposed Quantity: 1 Electronic Signature(s) Signed: 12/31/2021 9:59:42 AM By: Deon Pilling RN, BSN Signed: 12/31/2021 10:10:57 AM By: [name] MD FACS Previous Signature: 12/25/2021 10:44:54 AM Version By: [name] MD FACS Entered By: Deon Pilling on 12/31/2021 09:59:41

## 2021-12-30 ENCOUNTER — Ambulatory Visit (INDEPENDENT_AMBULATORY_CARE_PROVIDER_SITE_OTHER)
Admission: RE | Admit: 2021-12-30 | Discharge: 2021-12-30 | Disposition: A | Discharge status: Home / Self Care | Payer: Medicare HMO | Source: Ambulatory Visit | Attending: Vascular Surgery | Admitting: Vascular Surgery

## 2021-12-30 ENCOUNTER — Ambulatory Visit (HOSPITAL_COMMUNITY)
Admission: RE | Admit: 2021-12-30 | Discharge: 2021-12-30 | Disposition: A | Discharge status: Home / Self Care | Payer: Medicare HMO | Source: Ambulatory Visit | Attending: Vascular Surgery | Admitting: Vascular Surgery

## 2021-12-30 ENCOUNTER — Encounter: Payer: Self-pay | Admitting: Vascular Surgery

## 2021-12-30 ENCOUNTER — Ambulatory Visit: Payer: Medicare HMO | Admitting: Vascular Surgery

## 2021-12-30 VITALS — BP 127/71 | HR 81 | Temp 98.1°F | Resp 20 | Ht 64.0 in | Wt 160.0 lb

## 2021-12-30 DIAGNOSIS — N186 End stage renal disease: Secondary | ICD-10-CM | POA: Diagnosis present

## 2021-12-30 NOTE — H&P (View-Only) (Signed)
Patient ID: [name], female   DOB: Sep 30, 1940, 81 y.o.   MRN: [id number]  Reason for Consult: Follow-up   Referred by Trey Sailors, PA  Subjective:     HPI:  [name] is a 81 y.o. female history of end-stage renal disease currently on dialysis via right IJ catheter.  We placed a left arm AV fistula which unfortunately has thrombosed prior to use.  Currently dialyzing Tuesdays, Thursdays and Saturdays via catheter.  She is right-hand dominant.  Past Medical History:  Diagnosis Date   Chronic kidney disease    Diabetes mellitus without complication ([place])    Dyspnea    with exertion   Hypertension    Neuromuscular disorder ([place])    neuropathy bil feet   Pneumonia    Family History  Family history unknown: Yes   Past Surgical History:  Procedure Laterality Date   AV FISTULA PLACEMENT Left 06/18/2021   Procedure: LEFT ARM ARTERIOVENOUS (AV) FISTULA CREATION;  Surgeon: [name], MD;  Location: [facility];  Service: Vascular;  Laterality: Left;   [place] Left 08/28/2021   Procedure: LEFT ARM BASILIC VEIN TRANSPOSITION;  Surgeon: [name], MD;  Location: [facility];  Service: Vascular;  Laterality: Left;   CARDIOVERSION N/A 01/14/2021   Procedure: CARDIOVERSION;  Surgeon: Adrian Prows, MD;  Location: [facility];  Service: Cardiovascular;  Laterality: N/A;   IR FLUORO GUIDE CV LINE RIGHT  06/15/2021   IR US GUIDE VASC ACCESS RIGHT  06/15/2021   TONSILLECTOMY      Short Social History:  Social History   Tobacco Use   Smoking status: Never    Passive exposure: Never   Smokeless tobacco: Never  Substance Use Topics   Alcohol use: No    Allergies  Allergen Reactions   Amlodipine Other (See Comments)    Patient has LE edema to this med at higher doses (10 mg)    Current Outpatient Medications  Medication Sig Dispense Refill   Accu-Chek FastClix Lancets MISC See admin instructions.     acetaminophen (TYLENOL) 500 MG  tablet Take 500 mg by mouth every 6 (six) hours as needed for mild pain, fever or headache.     BD PEN NEEDLE NANO 2ND GEN 32G X 4 MM MISC 3 (three) times daily. as directed     Blood Glucose Monitoring Suppl (ACCU-CHEK GUIDE ME) w/Device KIT See admin instructions.     HUMULIN 70/30 KWIKPEN (70-30) 100 UNIT/ML KwikPen Inject 5 Units into the skin daily. (Patient taking differently: Inject 4 Units into the skin at bedtime.) 15 mL 1   hydrALAZINE (APRESOLINE) 50 MG tablet Take 50 mg by mouth 3 (three) times daily.     Hydrocortisone (WIOMBTDHR-41 EX) Apply 1 application. topically 3 (three) times daily as needed (itching).     Menthol-Methyl Salicylate (MUSCLE RUB EX) Apply 1 application. topically 3 (three) times daily as needed (leg cramps).     sevelamer carbonate (RENVELA) 800 MG tablet Take 800 mg by mouth 3 (three) times daily with meals.     diltiazem (CARDIZEM CD) 180 MG 24 hr capsule TAKE 1 CAPSULE(180 MG) BY MOUTH DAILY (Patient not taking: Reported on 12/30/2021) 90 capsule 0   HYDROcodone-acetaminophen (NORCO/VICODIN) 5-325 MG tablet Take 1 tablet by mouth every 6 (six) hours as needed for moderate pain. (Patient not taking: Reported on 12/30/2021) 20 tablet 0   pravastatin (PRAVACHOL) 20 MG tablet Take 1 tablet (20 mg total) by mouth daily. (Patient not taking: Reported on  12/30/2021) 90 tablet 3   No current facility-administered medications for this visit.    Review of Systems  Constitutional:  Constitutional negative. HENT: HENT negative.  Eyes: Eyes negative.  Respiratory: Respiratory negative.  Cardiovascular: Cardiovascular negative.  GI: Gastrointestinal negative.  Musculoskeletal: Musculoskeletal negative.  Skin: Skin negative.  Neurological: Neurological negative. Hematologic: Hematologic/lymphatic negative.  Psychiatric: Psychiatric negative.        Objective:  Objective   Vitals:   12/30/21 1030  BP: 127/71  Pulse: 81  Resp: 20  Temp: 98.1 F (36.7 C)   SpO2: 97%  Weight: 160 lb (72.6 kg)  Height: _0  (1.626 m)   Body mass index is 27.46 kg/m.  Physical Exam HENT:     Head: Normocephalic.     Nose: Nose normal.  Eyes:     Pupils: Pupils are equal, round, and reactive to light.  Neck:     Comments: Right IJ catheter in place without any signs of infection or swelling Cardiovascular:     Rate and Rhythm: Normal rate.     Pulses:          Radial pulses are 2+ on the right side and 2+ on the left side.  Abdominal:     General: Abdomen is flat.     Palpations: Abdomen is soft.  Musculoskeletal:     Comments: Left arm fistula without thrill or bruit and feels thrombosed without any pain to palpation  Neurological:     General: No focal deficit present.     Mental Status: She is alert.  Psychiatric:        Mood and Affect: Mood normal.        Thought Content: Thought content normal.     Data: Right Cephalic   Diameter (cm)Depth (cm)   Findings     +-----------------+-------------+----------+--------------+  Shoulder             0.26                               +-----------------+-------------+----------+--------------+  Prox upper arm       0.20                               +-----------------+-------------+----------+--------------+  Mid upper arm        0.29                               +-----------------+-------------+----------+--------------+  Dist upper arm       0.25                               +-----------------+-------------+----------+--------------+  Antecubital fossa    0.14                               +-----------------+-------------+----------+--------------+  Prox forearm         0.29               thickened wall  +-----------------+-------------+----------+--------------+  Mid forearm          0.26                               +-----------------+-------------+----------+--------------+  Dist  forearm         0.25                                +-----------------+-------------+----------+--------------+   +-----------------+-------------+----------+---------+  Right Basilic    Diameter (cm)Depth (cm)Findings   +-----------------+-------------+----------+---------+  Prox upper arm       0.33                          +-----------------+-------------+----------+---------+  Mid upper arm        0.33                          +-----------------+-------------+----------+---------+  Dist upper arm       0.37               branching  +-----------------+-------------+----------+---------+  Antecubital fossa    0.42                          +-----------------+-------------+----------+---------+   +-----------------+-------------+----------+--------------+  Left Cephalic    Diameter (cm)Depth (cm)   Findings     +-----------------+-------------+----------+--------------+  Shoulder                                not visualized  +-----------------+-------------+----------+--------------+  Prox upper arm                          not visualized  +-----------------+-------------+----------+--------------+  Mid upper arm                           not visualized  +-----------------+-------------+----------+--------------+  Dist upper arm                          not visualized  +-----------------+-------------+----------+--------------+  Antecubital fossa                       not visualized  +-----------------+-------------+----------+--------------+  Prox forearm         0.32                               +-----------------+-------------+----------+--------------+  Mid forearm          0.21                               +-----------------+-------------+----------+--------------+  Dist forearm         0.35                               +-----------------+-------------+----------+--------------+   Summary: Right: Patent and compressible cephalic and basilic veins.   Left: Patent forearm cephalic vein, not well visualized in the upper        arm.        The BVT demonstrates thrombus in the distal upper arm segment        with a narrow channel of flow.   Right Pre-Dialysis Findings:  +-----------------------+----------+--------------------+--------+--------+   Location               PSV (cm/s)Intralum. Diam.  (cm)WaveformComments  +-----------------------+----------+--------------------+--------+--------+  Brachial Antecub. fossa61        0.40                biphasic            +-----------------------+----------+--------------------+--------+--------+   Radial Art at Wrist    71        0.29                biphasic            +-----------------------+----------+--------------------+--------+--------+   Ulnar Art at Wrist     70        0.20                biphasic            +-----------------------+----------+--------------------+--------+--------+        Left Pre-Dialysis Findings:  +-----------------------+----------+--------------------+--------+--------+   Location               PSV (cm/s)Intralum. Diam.  (cm)WaveformComments  +-----------------------+----------+--------------------+--------+--------+   Brachial Antecub. fossa78        0.44                biphasic            +-----------------------+----------+--------------------+--------+--------+   Radial Art at Wrist    57        0.22                biphasic            +-----------------------+----------+--------------------+--------+--------+   Ulnar Art at Wrist     55        0.20                biphasic            +-----------------------+----------+--------------------+--------+--------+      Summary:     Right: No obstruction visualized in the right upper extremity.  Left: No obstruction visualized in the left upper extremity.      Assessment/Plan:     81 year old female on dialysis via right IJ catheter.   Previously left upper arm fistula has failed.  We will plan for right upper arm AV graft versus fistula nondialysis day in the near future.  Currently dialyzes Tuesdays Thursdays and Saturdays.     [name] MD [facility]

## 2021-12-30 NOTE — Progress Notes (Signed)
 Patient ID: [name], female   DOB: 08/25/1940, 81 y.o.   MRN: [id number]  Reason for Consult: Follow-up   Referred by Vanstory, Ashley N, PA  Subjective:     HPI:  [name] is a 81 y.o. female history of end-stage renal disease currently on dialysis via right IJ catheter.  We placed a left arm AV fistula which unfortunately has thrombosed prior to use.  Currently dialyzing Tuesdays, Thursdays and Saturdays via catheter.  She is right-hand dominant.  Past Medical History:  Diagnosis Date   Chronic kidney disease    Diabetes mellitus without complication ([facility])    Dyspnea    with exertion   Hypertension    Neuromuscular disorder ([facility])    neuropathy bil feet   Pneumonia    Family History  Family history unknown: Yes   Past Surgical History:  Procedure Laterality Date   AV FISTULA PLACEMENT Left 06/18/2021   Procedure: LEFT ARM ARTERIOVENOUS (AV) FISTULA CREATION;  Surgeon: Juniel Groene Christopher, MD;  Location: [facility];  Service: Vascular;  Laterality: Left;   BASCILIC VEIN TRANSPOSITION Left 08/28/2021   Procedure: LEFT ARM BASILIC VEIN TRANSPOSITION;  Surgeon: Austin Pongratz Christopher, MD;  Location: [facility];  Service: Vascular;  Laterality: Left;   CARDIOVERSION N/A 01/14/2021   Procedure: CARDIOVERSION;  Surgeon: Ganji, Jay, MD;  Location: [facility];  Service: Cardiovascular;  Laterality: N/A;   IR FLUORO GUIDE CV LINE RIGHT  06/15/2021   IR US GUIDE VASC ACCESS RIGHT  06/15/2021   TONSILLECTOMY      Short Social History:  Social History   Tobacco Use   Smoking status: Never    Passive exposure: Never   Smokeless tobacco: Never  Substance Use Topics   Alcohol use: No    Allergies  Allergen Reactions   Amlodipine Other (See Comments)    Patient has LE edema to this med at higher doses (10 mg)    Current Outpatient Medications  Medication Sig Dispense Refill   Accu-Chek FastClix Lancets MISC See admin instructions.     acetaminophen (TYLENOL) 500 MG  tablet Take 500 mg by mouth every 6 (six) hours as needed for mild pain, fever or headache.     BD PEN NEEDLE NANO 2ND GEN 32G X 4 MM MISC 3 (three) times daily. as directed     Blood Glucose Monitoring Suppl (ACCU-CHEK GUIDE ME) w/Device KIT See admin instructions.     HUMULIN 70/30 KWIKPEN (70-30) 100 UNIT/ML KwikPen Inject 5 Units into the skin daily. (Patient taking differently: Inject 4 Units into the skin at bedtime.) 15 mL 1   hydrALAZINE (APRESOLINE) 50 MG tablet Take 50 mg by mouth 3 (three) times daily.     Hydrocortisone (CORTIZONE-10 EX) Apply 1 application. topically 3 (three) times daily as needed (itching).     Menthol-Methyl Salicylate (MUSCLE RUB EX) Apply 1 application. topically 3 (three) times daily as needed (leg cramps).     sevelamer carbonate (RENVELA) 800 MG tablet Take 800 mg by mouth 3 (three) times daily with meals.     diltiazem (CARDIZEM CD) 180 MG 24 hr capsule TAKE 1 CAPSULE(180 MG) BY MOUTH DAILY (Patient not taking: Reported on 12/30/2021) 90 capsule 0   HYDROcodone-acetaminophen (NORCO/VICODIN) 5-325 MG tablet Take 1 tablet by mouth every 6 (six) hours as needed for moderate pain. (Patient not taking: Reported on 12/30/2021) 20 tablet 0   pravastatin (PRAVACHOL) 20 MG tablet Take 1 tablet (20 mg total) by mouth daily. (Patient not taking: Reported on   12/30/2021) 90 tablet 3   No current facility-administered medications for this visit.    Review of Systems  Constitutional:  Constitutional negative. HENT: HENT negative.  Eyes: Eyes negative.  Respiratory: Respiratory negative.  Cardiovascular: Cardiovascular negative.  GI: Gastrointestinal negative.  Musculoskeletal: Musculoskeletal negative.  Skin: Skin negative.  Neurological: Neurological negative. Hematologic: Hematologic/lymphatic negative.  Psychiatric: Psychiatric negative.        Objective:  Objective   Vitals:   12/30/21 1030  BP: 127/71  Pulse: 81  Resp: 20  Temp: 98.1 F (36.7 C)   SpO2: 97%  Weight: 160 lb (72.6 kg)  Height: 5' 4" (1.626 m)   Body mass index is 27.46 kg/m.  Physical Exam HENT:     Head: Normocephalic.     Nose: Nose normal.  Eyes:     Pupils: Pupils are equal, round, and reactive to light.  Neck:     Comments: Right IJ catheter in place without any signs of infection or swelling Cardiovascular:     Rate and Rhythm: Normal rate.     Pulses:          Radial pulses are 2+ on the right side and 2+ on the left side.  Abdominal:     General: Abdomen is flat.     Palpations: Abdomen is soft.  Musculoskeletal:     Comments: Left arm fistula without thrill or bruit and feels thrombosed without any pain to palpation  Neurological:     General: No focal deficit present.     Mental Status: She is alert.  Psychiatric:        Mood and Affect: Mood normal.        Thought Content: Thought content normal.     Data: Right Cephalic   Diameter (cm)Depth (cm)   Findings     +-----------------+-------------+----------+--------------+  Shoulder             0.26                               +-----------------+-------------+----------+--------------+  Prox upper arm       0.20                               +-----------------+-------------+----------+--------------+  Mid upper arm        0.29                               +-----------------+-------------+----------+--------------+  Dist upper arm       0.25                               +-----------------+-------------+----------+--------------+  Antecubital fossa    0.14                               +-----------------+-------------+----------+--------------+  Prox forearm         0.29               thickened wall  +-----------------+-------------+----------+--------------+  Mid forearm          0.26                               +-----------------+-------------+----------+--------------+  Dist   forearm         0.25                                +-----------------+-------------+----------+--------------+   +-----------------+-------------+----------+---------+  Right Basilic    Diameter (cm)Depth (cm)Findings   +-----------------+-------------+----------+---------+  Prox upper arm       0.33                          +-----------------+-------------+----------+---------+  Mid upper arm        0.33                          +-----------------+-------------+----------+---------+  Dist upper arm       0.37               branching  +-----------------+-------------+----------+---------+  Antecubital fossa    0.42                          +-----------------+-------------+----------+---------+   +-----------------+-------------+----------+--------------+  Left Cephalic    Diameter (cm)Depth (cm)   Findings     +-----------------+-------------+----------+--------------+  Shoulder                                not visualized  +-----------------+-------------+----------+--------------+  Prox upper arm                          not visualized  +-----------------+-------------+----------+--------------+  Mid upper arm                           not visualized  +-----------------+-------------+----------+--------------+  Dist upper arm                          not visualized  +-----------------+-------------+----------+--------------+  Antecubital fossa                       not visualized  +-----------------+-------------+----------+--------------+  Prox forearm         0.32                               +-----------------+-------------+----------+--------------+  Mid forearm          0.21                               +-----------------+-------------+----------+--------------+  Dist forearm         0.35                               +-----------------+-------------+----------+--------------+   Summary: Right: Patent and compressible cephalic and basilic veins.   Left: Patent forearm cephalic vein, not well visualized in the upper        arm.        The BVT demonstrates thrombus in the distal upper arm segment        with a narrow channel of flow.   Right Pre-Dialysis Findings:  +-----------------------+----------+--------------------+--------+--------+   Location               PSV (cm/s)Intralum. Diam.  (cm)WaveformComments  +-----------------------+----------+--------------------+--------+--------+     Brachial Antecub. fossa61        0.40                biphasic            +-----------------------+----------+--------------------+--------+--------+   Radial Art at Wrist    71        0.29                biphasic            +-----------------------+----------+--------------------+--------+--------+   Ulnar Art at Wrist     70        0.20                biphasic            +-----------------------+----------+--------------------+--------+--------+        Left Pre-Dialysis Findings:  +-----------------------+----------+--------------------+--------+--------+   Location               PSV (cm/s)Intralum. Diam.  (cm)WaveformComments  +-----------------------+----------+--------------------+--------+--------+   Brachial Antecub. fossa78        0.44                biphasic            +-----------------------+----------+--------------------+--------+--------+   Radial Art at Wrist    57        0.22                biphasic            +-----------------------+----------+--------------------+--------+--------+   Ulnar Art at Wrist     55        0.20                biphasic            +-----------------------+----------+--------------------+--------+--------+      Summary:     Right: No obstruction visualized in the right upper extremity.  Left: No obstruction visualized in the left upper extremity.      Assessment/Plan:     81-year-old female on dialysis via right IJ catheter.   Previously left upper arm fistula has failed.  We will plan for right upper arm AV graft versus fistula nondialysis day in the near future.  Currently dialyzes Tuesdays Thursdays and Saturdays.     Dennis Hegeman Christopher Lakenya Riendeau MD [facility] [place]   

## 2021-12-31 ENCOUNTER — Other Ambulatory Visit: Payer: Self-pay

## 2021-12-31 DIAGNOSIS — N186 End stage renal disease: Secondary | ICD-10-CM

## 2022-01-01 ENCOUNTER — Encounter (HOSPITAL_BASED_OUTPATIENT_CLINIC_OR_DEPARTMENT_OTHER): Payer: Medicare HMO | Attending: General Surgery | Admitting: General Surgery

## 2022-01-01 DIAGNOSIS — I872 Venous insufficiency (chronic) (peripheral): Secondary | ICD-10-CM | POA: Insufficient documentation

## 2022-01-01 DIAGNOSIS — N186 End stage renal disease: Secondary | ICD-10-CM | POA: Diagnosis not present

## 2022-01-01 DIAGNOSIS — Z872 Personal history of diseases of the skin and subcutaneous tissue: Secondary | ICD-10-CM | POA: Diagnosis not present

## 2022-01-01 DIAGNOSIS — Z09 Encounter for follow-up examination after completed treatment for conditions other than malignant neoplasm: Secondary | ICD-10-CM | POA: Diagnosis present

## 2022-01-01 DIAGNOSIS — Z992 Dependence on renal dialysis: Secondary | ICD-10-CM | POA: Insufficient documentation

## 2022-01-01 DIAGNOSIS — I132 Hypertensive heart and chronic kidney disease with heart failure and with stage 5 chronic kidney disease, or end stage renal disease: Secondary | ICD-10-CM | POA: Insufficient documentation

## 2022-01-01 DIAGNOSIS — E1122 Type 2 diabetes mellitus with diabetic chronic kidney disease: Secondary | ICD-10-CM | POA: Diagnosis not present

## 2022-01-01 NOTE — Progress Notes (Addendum)
[name] ([id number]) Visit Report for 01/01/2022 Chief Complaint Document Details Patient Name: Date of Service: [name], [name] 01/01/2022 12:30 PM Medical Record Marquez: [id number] Patient Account Marquez: [id number] Date of Birth/Sex: Treating RN: Sep 30, 1940 (81 y.o. [name] Other Clinician: Referring Provider: Treating Provider/Extender: [name] in Treatment: 1 Information Obtained from: Patient Chief Complaint Patient presents for treatment of open ulcers due to venous insufficiency Electronic Signature(s) Signed: 01/01/2022 1:17:39 PM By: [name] MD FACS Entered By: [name] on 01/01/2022 13:17:38 -------------------------------------------------------------------------------- HPI Details Patient Name: Date of Service: [name], [name] 01/01/2022 12:30 PM Medical Record Marquez: [id number] Patient Account Marquez: [id number] Date of Birth/Sex: Treating RN: August 26, 1940 (81 y.o. [name] Primary Care Provider: [name] Marquez Other Clinician: Referring Provider: Treating Provider/Extender: [name] in Treatment: 1 History of Present Illness HPI Description: ADMISSION 12/25/2021 This is an 81 year old woman with multiple medical comorbidities including congestive heart failure, hypertension, end-stage renal disease on hemodialysis, polyneuropathy, poorly controlled type 2 diabetes mellitus (last hemoglobin A1c available to me is from December 2022 and was 9.6). She presents to clinic today with multiple open ulcers on her bilateral lower extremities in the setting of severe edema. She says that she has compression stockings but has not worn them in some time because they are too tight. Her wounds are secondary to trauma from her wheelchair. She often sleeps in her wheelchair with her legs hanging down. ABIs in clinic today were noncompressible on  the right and 0.57 on the left. She has multiple shallow ulcers on the bilateral lower extremities. She has 2+ pitting edema and her legs are extremely tense. They are somewhat erythematous and warm. She is currently taking cephalexin prescribed by her primary care provider due to concern for cellulitis. 01/01/2022: All of her wounds are healed. Edema control with the Kerlix and Coban wraps is excellent. She does have compression stockings at home. Electronic Signature(s) Signed: 01/01/2022 1:18:28 PM By: [name] MD FACS Entered By: [name] on 01/01/2022 13:18:28 -------------------------------------------------------------------------------- Physical Exam Details Patient Name: Date of Service: [name], [name] 01/01/2022 12:30 PM Medical Record Marquez: [id number] Patient Account Marquez: [id number] Date of Birth/Sex: Treating RN: 1940/08/24 (81 y.o. [name] Other Clinician: Referring Provider: Treating Provider/Extender: [name] in Treatment: 1 Constitutional . . . . No acute distress.Marland Kitchen Respiratory Normal work of breathing on room air.. Notes 01/01/2022: Her wounds are healed. Electronic Signature(s) Signed: 01/01/2022 1:19:18 PM By: [name] MD FACS Entered By: [name] on 01/01/2022 13:19:17 -------------------------------------------------------------------------------- Physician Orders Details Patient Name: Date of Service: [name], [name] 01/01/2022 12:30 PM Medical Record Marquez: [id number] Patient Account Marquez: [id number] Date of Birth/Sex: Treating RN: 1941-04-21 (81 y.o. [name] Other Clinician: Referring Provider: Treating Provider/Extender: [name] in Treatment: 1 Verbal / Phone Orders: No Diagnosis Coding ICD-10 Coding Code Description [phone] Non-pressure chronic ulcer of  other part of left lower leg with fat layer exposed L97.812 Non-pressure chronic ulcer of other part of right lower leg with fat layer exposed N18.6 End stage renal disease Z99.2 Dependence on renal dialysis E11.622 Type 2 diabetes mellitus with other skin ulcer G31.51 Chronic diastolic (congestive) heart failure I87.2 Venous insufficiency (chronic) (peripheral) Discharge From [facility] Services Discharge from [place] Edema Control - Lymphedema / SCD / Other Bilateral Lower Extremities Elevate legs to the  level of the heart or above for 30 minutes daily and/or when sitting, a frequency of: - throughout the day, especially at night Exercise regularly Moisturize legs daily. - both legs nightly after removing stockings Compression stocking or Garment 20-30 mm/Hg pressure to: - both legs daily Electronic Signature(s) Signed: 01/01/2022 1:19:26 PM By: [name] MD FACS Entered By: [name] on 01/01/2022 13:19:25 -------------------------------------------------------------------------------- Problem List Details Patient Name: Date of Service: [name], [name] 01/01/2022 12:30 PM Medical Record Marquez: [id number] Patient Account Marquez: [id number] Date of Birth/Sex: Treating RN: 1941/03/06 (81 y.o. [name] Primary Care Provider: [name] Marquez Other Clinician: Referring Provider: Treating Provider/Extender: [name] in Treatment: 1 Active Problems ICD-10 Encounter Code Description Active Date MDM Diagnosis [phone] Non-pressure chronic ulcer of other part of left lower leg with fat layer exposed8/25/2023 No Yes L97.812 Non-pressure chronic ulcer of other part of right lower leg with fat layer 12/25/2021 No Yes exposed N18.6 End stage renal disease 12/25/2021 No Yes Z99.2 Dependence on renal dialysis 12/25/2021 No Yes E11.622 Type 2 diabetes mellitus with other skin ulcer 12/25/2021 No Yes X32.44 Chronic diastolic (congestive)  heart failure 12/25/2021 No Yes I87.2 Venous insufficiency (chronic) (peripheral) 12/25/2021 No Yes Inactive Problems Resolved Problems Electronic Signature(s) Signed: 01/01/2022 1:16:52 PM By: [name] MD FACS Entered By: [name] on 01/01/2022 13:16:52 -------------------------------------------------------------------------------- Progress Note Details Patient Name: Date of Service: [name], [name] 01/01/2022 12:30 PM Medical Record Marquez: [id number] Patient Account Marquez: [id number] Date of Birth/Sex: Treating RN: May 10, 1940 (81 y.o. [name] Other Clinician: Referring Provider: Treating Provider/Extender: [name] in Treatment: 1 Subjective Chief Complaint Information obtained from Patient Patient presents for treatment of open ulcers due to venous insufficiency History of Present Illness (HPI) ADMISSION 12/25/2021 This is an 81 year old woman with multiple medical comorbidities including congestive heart failure, hypertension, end-stage renal disease on hemodialysis, polyneuropathy, poorly controlled type 2 diabetes mellitus (last hemoglobin A1c available to me is from December 2022 and was 9.6). She presents to clinic today with multiple open ulcers on her bilateral lower extremities in the setting of severe edema. She says that she has compression stockings but has not worn them in some time because they are too tight. Her wounds are secondary to trauma from her wheelchair. She often sleeps in her wheelchair with her legs hanging down. ABIs in clinic today were noncompressible on the right and 0.57 on the left. She has multiple shallow ulcers on the bilateral lower extremities. She has 2+ pitting edema and her legs are extremely tense. They are somewhat erythematous and warm. She is currently taking cephalexin prescribed by her primary care provider due to concern for  cellulitis. 01/01/2022: All of her wounds are healed. Edema control with the Kerlix and Coban wraps is excellent. She does have compression stockings at home. Patient History Information obtained from Patient, Caregiver, Chart. Family History Heart Disease - Mother,Father, Hypertension - Mother,Father, No family history of Cancer, Diabetes, Hereditary Spherocytosis, Kidney Disease, Lung Disease, Seizures, Stroke, Thyroid Problems, Tuberculosis. Social History Never smoker, Marital Status - Divorced, Alcohol Use - Never, Drug Use - No History, Caffeine Use - Daily. Medical History Eyes Patient has history of Cataracts - bil removed Denies history of Glaucoma, Optic Neuritis Hematologic/Lymphatic Denies history of Anemia Cardiovascular Patient has history of Arrhythmia - afib, Congestive Heart Failure, Hypertension Endocrine Patient has history of Type II Diabetes Denies history of Type I Diabetes Genitourinary Patient has history of  End Stage Renal Disease - HD Integumentary (Skin) Denies history of History of Burn Neurologic Patient has history of Neuropathy Oncologic Denies history of Received Chemotherapy, Received Radiation Psychiatric Denies history of Anorexia/bulimia, Confinement Anxiety Hospitalization/Surgery History - AV fistula plaement left arm. - tonsillectomy. Medical A Surgical History Notes nd Constitutional Symptoms (General Health) vitamin D deficiency Cardiovascular hyperlipidemia Objective Constitutional No acute distress.. Vitals Time Taken: 12:51 PM, Height: 65 in, Weight: 160 lbs, BMI: 26.6, Temperature: 98.2 F, Pulse: 78 bpm, Respiratory Rate: 18 breaths/min, Blood Pressure: 121/69 mmHg. Respiratory Normal work of breathing on room air.. General Notes: 01/01/2022: Her wounds are healed. Integumentary (Hair, Skin) Wound #1 status is Healed - Epithelialized. Original cause of wound was Trauma. The date acquired was: 12/18/2021. The wound has been in  treatment 1 weeks. The wound is located on the Right,Anterior Lower Leg. The wound measures 0cm length x 0cm width x 0cm depth; 0cm^2 area and 0cm^3 volume. There is Fat Layer (Subcutaneous Tissue) exposed. There is no tunneling or undermining noted. There is a medium amount of serous drainage noted. The wound margin is flat and intact. There is small (1-33%) red granulation within the wound bed. There is no necrotic tissue within the wound bed. Wound #2 status is Healed - Epithelialized. Original cause of wound was Trauma. The date acquired was: 12/11/2021. The wound has been in treatment 1 weeks. The wound is located on the Right,Lateral Lower Leg. The wound measures 0cm length x 0cm width x 0cm depth; 0cm^2 area and 0cm^3 volume. There is no tunneling or undermining noted. There is a small amount of serous drainage noted. There is no granulation within the wound bed. There is no necrotic tissue within the wound bed. Wound #3 status is Healed - Epithelialized. Original cause of wound was Trauma. The date acquired was: 12/01/2021. The wound has been in treatment 1 weeks. The wound is located on the Left,Anterior Lower Leg. The wound measures 0cm length x 0cm width x 0cm depth; 0cm^2 area and 0cm^3 volume. There is no tunneling or undermining noted. There is a small amount of serous drainage noted. There is no granulation within the wound bed. There is no necrotic tissue within the wound bed. Wound #4 status is Healed - Epithelialized. Original cause of wound was Gradually Appeared. The date acquired was: 12/01/2021. The wound has been in treatment 1 weeks. The wound is located on the [facility] Lower Leg. The wound measures 0cm length x 0cm width x 0cm depth; 0cm^2 area and 0cm^3 volume. There is no tunneling or undermining noted. There is a small amount of serous drainage noted. The wound margin is flat and intact. There is no granulation within the wound bed. There is no necrotic tissue within  the wound bed. Assessment Active Problems ICD-10 Non-pressure chronic ulcer of other part of left lower leg with fat layer exposed Non-pressure chronic ulcer of other part of right lower leg with fat layer exposed End stage renal disease Dependence on renal dialysis Type 2 diabetes mellitus with other skin ulcer Chronic diastolic (congestive) heart failure Venous insufficiency (chronic) (peripheral) Plan Discharge From [facility] Services: Discharge from [place] Edema Control - Lymphedema / SCD / Other: Elevate legs to the level of the heart or above for 30 minutes daily and/or when sitting, a frequency of: - throughout the day, especially at night Exercise regularly Moisturize legs daily. - both legs nightly after removing stockings Compression stocking or Garment 20-30 mm/Hg pressure to: - both legs daily 01/01/2022: Her  wounds are healed. I think this is largely in part due to the compression provided by the Kerlix and Coban wraps. She was advised that she really does need to wear her compression stockings, which she has, on a daily basis. She needs to elevate her legs and should not be sleeping in a chair with her legs hanging down. We will discharge her from the wound care center. Follow-up as needed. Electronic Signature(s) Signed: 01/01/2022 1:20:20 PM By: [name] MD FACS Entered By: [name] on 01/01/2022 13:20:20 -------------------------------------------------------------------------------- HxROS Details Patient Name: Date of Service: [name], [name] 01/01/2022 12:30 PM Medical Record Marquez: [id number] Patient Account Marquez: [id number] Date of Birth/Sex: Treating RN: [birth year] (82 y.o. [name] Other Clinician: Referring Provider: Treating Provider/Extender: [name] in Treatment: 1 Information Obtained From Patient Caregiver Chart Constitutional Symptoms (General  Health) Medical History: Past Medical History Notes: vitamin D deficiency Eyes Medical History: Positive for: Cataracts - bil removed Negative for: Glaucoma; Optic Neuritis Hematologic/Lymphatic Medical History: Negative for: Anemia Cardiovascular Medical History: Positive for: Arrhythmia - afib; Congestive Heart Failure; Hypertension Past Medical History Notes: hyperlipidemia Endocrine Medical History: Positive for: Type II Diabetes Negative for: Type I Diabetes Time with diabetes: approx 6 yrs Treated with: Insulin Blood sugar tested every day: Yes Tested : 2 times per day Genitourinary Medical History: Positive for: End Stage Renal Disease - HD Integumentary (Skin) Medical History: Negative for: History of Burn Neurologic Medical History: Positive for: Neuropathy Oncologic Medical History: Negative for: Received Chemotherapy; Received Radiation Psychiatric Medical History: Negative for: Anorexia/bulimia; Confinement Anxiety HBO Extended History Items Eyes: Cataracts Immunizations Pneumococcal Vaccine: Received Pneumococcal Vaccination: No Implantable Devices No devices added Hospitalization / Surgery History Type of Hospitalization/Surgery AV fistula plaement left arm tonsillectomy Family and Social History Cancer: No; Diabetes: No; Heart Disease: Yes - Mother,Father; Hereditary Spherocytosis: No; Hypertension: Yes - Mother,Father; Kidney Disease: No; Lung Disease: No; Seizures: No; Stroke: No; Thyroid Problems: No; Tuberculosis: No; Never smoker; Marital Status - Divorced; Alcohol Use: Never; Drug Use: No History; Caffeine Use: Daily; Financial Concerns: No; Food, Clothing or Shelter Needs: No; Support System Lacking: No; Transportation Concerns: No Engineer, maintenance) Signed: 01/01/2022 1:39:13 PM By: [name] MD FACS Signed: 01/01/2022 5:39:24 PM By: Baruch Gouty RN, BSN Entered By: [name] on 01/01/2022  13:18:33 -------------------------------------------------------------------------------- SuperBill Details Patient Name: Date of Service: [name], [name] 01/01/2022 Medical Record Marquez: [id number] Patient Account Marquez: [id number] Date of Birth/Sex: Treating RN: 17-Nov-1940 (80 y.o. [name] Other Clinician: Referring Provider: Treating Provider/Extender: [name] in Treatment: 1 Diagnosis Coding ICD-10 Codes Code Description [phone] Non-pressure chronic ulcer of other part of left lower leg with fat layer exposed L97.812 Non-pressure chronic ulcer of other part of right lower leg with fat layer exposed N18.6 End stage renal disease Z99.2 Dependence on renal dialysis E11.622 Type 2 diabetes mellitus with other skin ulcer N39.76 Chronic diastolic (congestive) heart failure I87.2 Venous insufficiency (chronic) (peripheral) Physician Procedures : CPT4 Code Description Modifier [id number] - WC PHYS LEVEL 3 - EST PT ICD-10 Diagnosis Description L97.822 Non-pressure chronic ulcer of other part of left lower leg with fat layer exposed L97.812 Non-pressure chronic ulcer of other part of right  lower leg with fat layer exposed I87.2 Venous insufficiency (chronic) (peripheral) E11.622 Type 2 diabetes mellitus with other skin ulcer Quantity: 1 Electronic Signature(s) Signed: 01/01/2022 1:20:39 PM By: [name] MD FACS Entered By:  [name] on 01/01/2022 13:20:39

## 2022-01-01 NOTE — Progress Notes (Signed)
[name], [name] ([id number]) Visit Report for 01/01/2022 Arrival Information Details Patient Name: Date of Service: [name], [name] 01/01/2022 12:30 PM Medical Record Marquez: [id number] Patient Account Marquez: [id number] Date of Birth/Sex: Treating RN: Mar 26, 1941 (81 y.o. [name] Primary Care [name]: [name]: Referring [name]: Treating [name]/Extender: [name] in Treatment: 1 Visit Information History Since Last Visit Added or deleted any medications: No Patient Arrived: Wheel Chair Any new allergies or adverse reactions: No Arrival Time: 12:51 Had a fall or experienced change in No Accompanied By: son and daughter activities of daily living that may affect Transfer Assistance: None risk of falls: Patient Identification Verified: Yes Signs or symptoms of abuse/neglect since last visito No Secondary Verification Process Completed: Yes Hospitalized since last visit: No Patient Requires Transmission-Based Precautions: No Implantable device outside of the clinic excluding No Patient Has Alerts: No cellular tissue based products placed in the center since last visit: Has Dressing in Place as Prescribed: Yes Has Compression in Place as Prescribed: Yes Pain Present Now: No Electronic Signature(s) Signed: 01/01/2022 5:39:24 PM By: Terry Gouty RN, BSN Entered By: [name] on 01/01/2022 12:51:47 -------------------------------------------------------------------------------- Encounter Discharge Information Details Patient Name: Date of Service: [name], [name] 01/01/2022 12:30 PM Medical Record Marquez: [id number] Patient Account Marquez: [id number] Date of Birth/Sex: Treating RN: 1940-09-16 (81 y.o. [name]: [name]: Referring Sahian Kerney: Treating [name]/Extender: [name] in Treatment: 1 Encounter Discharge  Information Items Discharge Condition: Stable Ambulatory Status: Wheelchair Discharge Destination: Home Transportation: Private Auto Accompanied By: son and daughter Schedule Follow-up Appointment: Yes Clinical Summary of Care: Patient Declined Electronic Signature(s) Signed: 01/01/2022 5:39:24 PM By: Terry Gouty RN, BSN Entered By: [name] on 01/01/2022 13:27:49 -------------------------------------------------------------------------------- Lower Extremity Assessment Details Patient Name: Date of Service: [name], [name] 01/01/2022 12:30 PM Medical Record Marquez: [id number] Patient Account Marquez: [id number] Date of Birth/Sex: Treating RN: 14-Nov-1940 (81 y.o. [name] Primary Care Glendell Fouse: [name]: Referring [name]: Treating [name]: [name] in Treatment: 1 Edema Assessment Assessed: [Left: No] [Right: No] Edema: [Left: Yes] [Right: Yes] Calf Left: Right: Point of Measurement: From Medial Instep 30 cm 30.4 cm Ankle Left: Right: Point of Measurement: From Medial Instep 20.2 cm 20.4 cm Vascular Assessment Pulses: Dorsalis Pedis Palpable: [Left:No] [Right:No] Electronic Signature(s) Signed: 01/01/2022 5:39:24 PM By: Terry Gouty RN, BSN Entered By: [name] on 01/01/2022 12:58:09 -------------------------------------------------------------------------------- Multi Wound Chart Details Patient Name: Date of Service: [name], [name] 01/01/2022 12:30 PM Medical Record Marquez: [id number] Patient Account Marquez: [id number] Date of Birth/Sex: Treating RN: [birth year] (81 y.o. 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/Ulcer of the Lower Diabetic Wound/Ulcer of the Lower Secondary Etiology: Extremity Extremity Extremity Cataracts, Arrhythmia, Congestive Cataracts, Arrhythmia, Congestive Cataracts, Arrhythmia, Congestive Comorbid History: Heart Failure, Hypertension, Type II Heart Failure, Hypertension, Type II Heart Failure, Hypertension, Type II Diabetes, End Stage Renal Disease, Diabetes, End Stage Renal Disease, Diabetes, End Stage Renal Disease, Neuropathy Neuropathy Neuropathy 12/18/2021 12/11/2021 12/01/2021 Date Acquired: 1  1 1 Weeks of Treatment: Healed - Epithelialized Healed - Epithelialized Healed - Epithelialized Wound Status: No No No Wound Recurrence: No No No Clustered Wound: N/A N/A N/A Clustered Quantity: 0x0x0 0x0x0 0x0x0 Measurements L x W x D (cm) 0 0 0 A (cm) : rea 0 0 0 Volume (cm) : 100.00% 100.00% 100.00% % Reduction in Area: 100.00% 100.00% 100.00% % Reduction in Volume: Full Thickness Without Exposed Full Thickness Without Exposed Full Thickness Without Exposed Classification: Support Structures Support Structures Support Structures Medium Small Small Exudate Amount: Serous Serous Serous Exudate Type: Geophysical data processor Exudate Color: Flat and Intact N/A N/A Wound Margin: Small (1-33%) None Present (0%) None Present (0%) Granulation Amount: Red N/A N/A Granulation Quality: None Present (0%) None Present (0%) None Present (0%) Necrotic Amount: Fat Layer (Subcutaneous Tissue): Yes Fascia: No Fascia: No Exposed Structures: Fascia: No Fat Layer (Subcutaneous Tissue): No Fat Layer (Subcutaneous Tissue): No Tendon: No Tendon: No Tendon:  No Muscle: No Muscle: No Muscle: No Joint: No Joint: No Joint: No Bone: No Bone: No Bone: No Large (67-100%) Large (67-100%) Large (67-100%) Epithelialization: Wound Marquez: 4 N/A N/A Photos: N/A N/A Left, Distal, Anterior Lower Leg N/A N/A Wound Location: Gradually Appeared N/A N/A Wounding Event: Venous Leg Ulcer N/A N/A Primary Etiology: Diabetic Wound/Ulcer of the Lower N/A N/A Secondary Etiology: Extremity Cataracts, Arrhythmia, Congestive N/A N/A Comorbid History: Heart Failure, Hypertension, Type II Diabetes, End Stage Renal Disease, Neuropathy 12/01/2021 N/A N/A Date Acquired: 1 N/A N/A Weeks of Treatment: Healed - Epithelialized N/A N/A Wound Status: No N/A N/A Wound Recurrence: Yes N/A N/A Clustered Wound: 3 N/A N/A Clustered Quantity: 0x0x0 N/A N/A Measurements L x W x D (cm) 0 N/A N/A A (cm) : rea 0 N/A N/A Volume (cm) : 100.00% N/A N/A % Reduction in Area: 100.00% N/A N/A % Reduction in Volume: Full Thickness Without Exposed N/A N/A Classification: Support Structures Small N/A N/A Exudate Amount: Serous N/A N/A Exudate Type: amber N/A N/A Exudate Color: Flat and Intact N/A N/A Wound Margin: None Present (0%) N/A N/A Granulation Amount: N/A N/A N/A Granulation Quality: None Present (0%) N/A N/A Necrotic Amount: Fascia: No N/A N/A Exposed Structures: Fat Layer (Subcutaneous Tissue): No Tendon: No Muscle: No Joint: No Bone: No Large (67-100%) N/A N/A Epithelialization: Treatment Notes Electronic Signature(s) Signed: 01/01/2022 1:17:33 PM By: [name] MD FACS Signed: 01/01/2022 5:39:24 PM By: Terry Gouty RN, BSN Entered By: [name] on 01/01/2022 13:17:33 -------------------------------------------------------------------------------- Multi-Disciplinary Care Plan Details Patient Name: Date of Service: [name], [name] 01/01/2022 12:30 PM Medical Record Marquez: [id number] Patient Account Marquez:  [id number] Date of Birth/Sex: Treating RN: 1940-08-27 (81 y.o. [name] Primary Care Mathews Stuhr: [name]: Referring Reona Zendejas: Treating Shatori Bertucci/Extender: [name] in Treatment: 1 [place] reviewed with physician Active Inactive Electronic Signature(s) Signed: 01/01/2022 5:39:24 PM By: Terry Gouty RN, BSN Entered By: [name] on 01/01/2022 13:15:58 -------------------------------------------------------------------------------- Pain Assessment Details Patient Name: Date of Service: [name], [name] 01/01/2022 12:30 PM Medical Record Marquez: [id number] Patient Account Marquez: [id number] Date of Birth/Sex: Treating RN: 09/07/1940 (81 y.o. [name] Primary Care Meldrick Buttery: [name]: Referring Odella Appelhans: Treating Copeland Lapier/Extender: [name] in Treatment: 1 Active Problems Location of Pain Severity and Description of Pain Patient Has Paino No Site Locations Rate the pain. Current Pain Level: 0 Pain Management and Medication Current Pain Management: Electronic Signature(s) Signed: 01/01/2022 5:39:24 PM By: Terry Gouty RN, BSN Entered By:  [name] on 01/01/2022 12:52:32 -------------------------------------------------------------------------------- Patient/Caregiver Education Details Patient Name: Date of Service: JANASHA, BARKALOW NN 9/1/2023andnbsp12:30 PM Medical Record Marquez: [id number] Patient Account Marquez: [id number] Date of Birth/Gender: Treating RN: 1940-07-14 (81 y.o. [name] Primary Care Physician: [name]: Referring Physician: Treating Physician/Extender: [name] in Treatment: 1 Education Assessment Education Provided To: Patient Education Topics Provided Elevated Blood Sugar/ Impact on Healing: Methods: Explain/Verbal Responses:  Reinforcements needed, State content correctly Venous: Methods: Explain/Verbal Responses: Reinforcements needed, State content correctly Wound/Skin Impairment: Methods: Explain/Verbal Responses: Reinforcements needed, State content correctly Electronic Signature(s) Signed: 01/01/2022 5:39:24 PM By: Terry Gouty RN, BSN Entered By: [name] on 01/01/2022 13:12:26 -------------------------------------------------------------------------------- Wound Assessment Details Patient Name: Date of Service: [name], [name] 01/01/2022 12:30 PM Medical Record Marquez: [id number] Patient Account Marquez: [id number] Date of Birth/Sex: Treating RN: 1940/10/13 (81 y.o. 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 Event: Trauma Etiology: Date Acquired: 12/18/2021 Wound Healed - Epithelialized Weeks Of Treatment: 1 Status: Clustered Wound: No Comorbid Cataracts, Arrhythmia, Congestive Heart Failure, Hypertension, History: Type II Diabetes, End Stage Renal Disease, Neuropathy Photos Wound Measurements Length: (cm) Width: (cm) Depth: (cm) Area: (cm) Volume: (cm) 0 % Reduction in Area: 100% 0 % Reduction in Volume: 100% 0 Epithelialization: Large (67-100%) 0 Tunneling: No 0 Undermining: No Wound Description Classification: Full Thickness Without Exposed Support Structures Wound Margin: Flat and Intact Exudate Amount: Medium Exudate Type: Serous Exudate Color: amber Foul Odor After Cleansing: No Slough/Fibrino No Wound Bed Granulation Amount: Small (1-33%) Exposed Structure Granulation Quality: Red Fascia Exposed: No Necrotic Amount: None Present (0%) Fat Layer  (Subcutaneous Tissue) Exposed: Yes Tendon Exposed: No Muscle Exposed: No Joint Exposed: No Bone Exposed: No Electronic Signature(s) Signed: 01/01/2022 5:39:24 PM By: Terry Gouty RN, BSN Entered By: [name] on 01/01/2022 13:15:37 -------------------------------------------------------------------------------- Wound Assessment Details Patient Name: Date of Service: [name], [name] 01/01/2022 12:30 PM Medical Record Marquez: [id number] Patient Account Marquez: [id number] Date of Birth/Sex: Treating RN: [birth year] (81 y.o. 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 Event: Trauma Etiology: Date Acquired: 12/11/2021 Wound Healed - Epithelialized Weeks Of Treatment: 1 Status: Clustered Wound: No Comorbid Cataracts, Arrhythmia, Congestive Heart Failure, Hypertension, History: Type II Diabetes, End Stage Renal Disease, Neuropathy Photos Wound Measurements Length: (cm) Width: (cm) Depth: (cm) Area: (cm) Volume: (cm) 0 % Reduction in Area: 100% 0 % Reduction in Volume: 100% 0 Epithelialization: Large (67-100%) 0 Tunneling: No 0 Undermining: No Wound Description Classification: Full Thickness Without Exposed Support Structures Exudate Amount: Small Exudate Type: Serous Exudate Color: amber Foul Odor After Cleansing: No Slough/Fibrino No Wound Bed Granulation Amount: None Present (0%) Exposed Structure Necrotic Amount: None Present (0%) Fascia Exposed: No Fat Layer (Subcutaneous Tissue) Exposed: No Tendon Exposed: No Muscle Exposed: No Joint Exposed: No Bone Exposed: No Electronic Signature(s) Signed: 01/01/2022 5:39:24 PM By: Terry Gouty RN, BSN Entered  By: [name] on 01/01/2022 13:10:22 -------------------------------------------------------------------------------- Wound Assessment Details Patient Name: Date of Service: [name], [name] 01/01/2022 12:30 PM Medical Record Marquez: [id number] Patient Account  Marquez: [id number] Date of Birth/Sex: Treating RN: Sep 20, 1940 (81 y.o. 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 Event: Trauma Etiology: Date Acquired: 12/01/2021 Wound Healed - Epithelialized Weeks Of Treatment: 1 Status: Clustered Wound: No Comorbid Cataracts, Arrhythmia, Congestive Heart Failure, Hypertension, History: Type II Diabetes, End Stage Renal Disease, Neuropathy Photos Wound Measurements Length: (cm) Width: (cm) Depth: (cm) Area: (cm) Volume: (cm) 0 % Reduction in Area: 100% 0 % Reduction in Volume: 100% 0 Epithelialization: Large (67-100%) 0 Tunneling: No 0 Undermining: No Wound Description Classification: Full Thickness Without Exposed Support Structures Exudate Amount: Small Exudate Type: Serous Exudate Color: amber Foul Odor After Cleansing: No Slough/Fibrino No Wound Bed Granulation Amount: None Present (0%) Exposed Structure Necrotic Amount: None Present (0%) Fascia Exposed: No Fat Layer (Subcutaneous Tissue) Exposed: No Tendon Exposed: No Muscle Exposed: No Joint Exposed: No Bone Exposed: No Electronic Signature(s) Signed: 01/01/2022 5:39:24 PM By: Terry Gouty RN, BSN Entered By: [name] on 01/01/2022 13:10:50 -------------------------------------------------------------------------------- Wound Assessment Details Patient Name: Date of Service: [name], [name]  01/01/2022 12:30 PM Medical Record Marquez: [id number] Patient Account Marquez: [id number] Date of Birth/Sex: Treating RN: 1940-12-03 (81 y.o. 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 Event: Gradually Appeared Etiology: Date Acquired: 12/01/2021 Wound Healed - Epithelialized Weeks Of Treatment: 1 Status: Clustered Wound: Yes Comorbid Cataracts, Arrhythmia, Congestive Heart Failure, Hypertension, History: Type II Diabetes, End Stage Renal Disease, Neuropathy Photos Wound Measurements Length: (cm) Width: (cm) Depth: (cm) Clustered Quantity: Area: (cm) Volume: (cm) 0 % Reduction in Area: 100% 0 % Reduction in Volume: 100% 0 Epithelialization: Large (67-100%) 3 Tunneling: No 0 Undermining: No 0 Wound Description Classification: Full Thickness Without Exposed Support Structures Wound Margin: Flat and Intact Exudate Amount: Small Exudate Type: Serous Exudate Color: amber Foul Odor After Cleansing: No Slough/Fibrino Yes Wound Bed Granulation Amount: None Present (0%) Exposed Structure Necrotic Amount: None Present (0%) Fascia Exposed: No Fat Layer (Subcutaneous Tissue) Exposed: No Tendon Exposed: No Muscle Exposed: No Joint Exposed: No Bone Exposed: No Electronic Signature(s) Signed: 01/01/2022 5:39:24 PM By: Terry Gouty RN, BSN Entered By: [name] on 01/01/2022 13:11:33 -------------------------------------------------------------------------------- [place] Details Patient Name: Date of Service: [name], GRITZ NN 01/01/2022 12:30 PM Medical Record Marquez: [id number] Patient Account Marquez: [id number] Date of Birth/Sex: Treating RN: 03/17/1941 (81  y.o. [name] Primary Care Leonia Heatherly: [name]: Referring Rooney Swails: Treating Francina Beery/Extender: [name] in Treatment: 1 Vital Signs Time Taken: 12:51 Temperature (F): 98.2 Height (in): 65 Pulse (bpm): 78 Weight (lbs): 160 Respiratory Rate (breaths/min): 18 Body Mass Index (BMI): 26.6 Blood Pressure (mmHg): 121/69 Reference Range: 80 - 120 mg / dl Electronic Signature(s) Signed: 01/01/2022 5:39:24 PM By: Terry Gouty RN, BSN Entered By: [name] on 01/01/2022 12:52:24

## 2022-01-21 ENCOUNTER — Other Ambulatory Visit (HOSPITAL_COMMUNITY): Payer: Self-pay | Admitting: General Surgery

## 2022-01-21 DIAGNOSIS — R600 Localized edema: Secondary | ICD-10-CM

## 2022-01-21 DIAGNOSIS — S81809D Unspecified open wound, unspecified lower leg, subsequent encounter: Secondary | ICD-10-CM

## 2022-01-28 ENCOUNTER — Encounter (HOSPITAL_COMMUNITY): Payer: Self-pay | Admitting: Vascular Surgery

## 2022-01-28 NOTE — Progress Notes (Signed)
Unable to reach patient via phone.  Left a detailed message on machine with instructions for DOS.  PCP - Dr Doristine Section Bonsu Cardiologist - Lawerance Cruel, PA-C  Chest x-ray - 08/04/21 (1V) EKG - 06/12/21 Stress Test - 06/11/20 PCV ECHO - 12/26/20 Cardiac Cath - n/a  ICD Pacemaker/Loop - n/a  Sleep Study -  n/a CPAP - none  Do not take oral diabetes medicines (pills) the morning of surgery.  THE NIGHT BEFORE SURGERY, take 7 units Humulin 70/30 Insulin.     If your blood sugar is less than 70 mg/dL, you will need to treat for low blood sugar: Treat a low blood sugar (less than 70 mg/dL) with  cup of clear juice (cranberry or apple), 4 glucose tablets, OR glucose gel. Recheck blood sugar in 15 minutes after treatment (to make sure it is greater than 70 mg/dL). If your blood sugar is not greater than 70 mg/dL on recheck, call [phone] for further instructions.  STOP now taking any Aspirin (unless otherwise instructed by your surgeon), Aleve, Naproxen, Ibuprofen, Motrin, Advil, Goody's, BC's, all herbal medications, fish oil, and all vitamins.

## 2022-01-28 NOTE — Anesthesia Preprocedure Evaluation (Addendum)
Anesthesia Evaluation  Patient identified by MRN, date of birth, ID band Patient awake    Reviewed: Allergy & Precautions, NPO status , Patient's Chart, lab work & pertinent test results  History of Anesthesia Complications Negative for: history of anesthetic complications  Airway Mallampati: I  TM Distance: >3 FB Neck ROM: Full    Dental  (+) Edentulous Upper, Edentulous Lower, Dental Advisory Given   Pulmonary neg pulmonary ROS,    Pulmonary exam normal        Cardiovascular hypertension, Pt. on medications Normal cardiovascular exam  Echocardiogram 12/26/2020:  Normal LV systolic function with visual EF 60-65%. Left ventricle cavity  is normal in size. Mild left ventricular hypertrophy. D-shaped septum in  systole and diastole suggestive of RV pressure and volume overload.   Normal global wall motion. Doppler evidence of grade III (restrictive)  diastolic dysfunction, elevated LAP.  Left atrial cavity is mildly dilated. Atrial septum bows from right to  left, suggestive of elevated RAP.  Right atrial cavity is visually dilated.  Right ventricle cavity is visually dilated and normal function.  Mild (Grade I) aortic regurgitation.  Mild mitral valve annulus calcification. Moderate (Grade III) mitral  regurgitation.  Severe tricuspid regurgitation. Moderate pulmonary hypertension. RVSP  measures 51 mmHg (may be unappreciated given RAE and dilated IVC leading  to equalization of pressures).  Moderate pulmonic regurgitation.  IVC is dilated with a respiratory response of <50%.  Compared to study 06/19/2020 G2DD is now G3DD,TR and PHTN remains stable  otherwise no significant change.    Neuro/Psych negative neurological ROS     GI/Hepatic negative GI ROS, Neg liver ROS,   Endo/Other  diabetes  Renal/GU Renal disease     Musculoskeletal negative musculoskeletal ROS (+)   Abdominal   Peds  Hematology negative  hematology ROS (+)   Anesthesia Other Findings   Reproductive/Obstetrics                                                            Anesthesia Evaluation  Patient identified by MRN, date of birth, ID band Patient confused    Reviewed: NPO status , Patient's Chart, lab work & pertinent test results  Airway Mallampati: II  TM Distance: >3 FB Neck ROM: Full    Dental  (+) Upper Dentures, Lower Dentures   Pulmonary    Pulmonary exam normal        Cardiovascular hypertension, Pt. on medications +CHF  + dysrhythmias Atrial Fibrillation  Rhythm:Regular Rate:Normal     Neuro/Psych Dementia    GI/Hepatic negative GI ROS, Neg liver ROS,   Endo/Other  diabetes, Type 2, Insulin Dependent  Renal/GU ESRF and DialysisRenal disease  negative genitourinary   Musculoskeletal negative musculoskeletal ROS (+)   Abdominal Normal abdominal exam  (+)   Peds  Hematology  (+) Blood dyscrasia, anemia ,   Anesthesia Other Findings   Reproductive/Obstetrics                            Anesthesia Physical Anesthesia Plan  ASA: 3  Anesthesia Plan: MAC and Regional   Post-op Pain Management: Regional block*   Induction: Intravenous  PONV Risk Score and Plan: 2 and Ondansetron, Dexamethasone, Propofol infusion and Treatment may vary due to age or medical condition  Airway Management Planned: Simple Face Mask, Natural Airway and Nasal Cannula  Additional Equipment: None  Intra-op Plan:   Post-operative Plan:   Informed Consent: I have reviewed the patients History and Physical, chart, labs and discussed the procedure including the risks, benefits and alternatives for the proposed anesthesia with the patient or authorized representative who has indicated his/her understanding and acceptance.     Dental advisory given  Plan Discussed with: CRNA  Anesthesia Plan Comments: (PAT note by Karoline Caldwell, PA-C: Follows  with cardiology for history of moderate pulmonary hypertension, DOE, HFpEF (grade 3 DD on echo 12/2020), paroxysmal atrial fibrillation, moderate MR, severe TR. Admitted 08/2020 with influenza and found to have new onset atrial fibrillation.  She underwent successful direct-current cardioversion 09/13/2020.  When last seen for cardiology follow-up 05/12/2021, tentative plan at that time was to resume Eliquis due to CHA2DS2-VASc score of 5 (had evidently been stopped inadvertently by patient), however it does not appear this ever happened.  Patient was admitted February 2023 for worsening kidney function bilateral lower extremity swelling.  During admission she was started on dialysis via tunneled catheter placed on 2/13.  She underwent left arm brachial artery to basilic vein AV fistula creation on 06/18/2021.  She was discharged with dialysis on Tuesday Thursday Saturday schedule.  Uncontrolled IDDM 2, last A1c 9.6 on 04/25/2021.  Patient will need day of surgery labs and evaluation.  EKG 06/12/2021: Wide QRS rhythm.  Rate 81.  Right bundle branch block.  Septal infarct, age undetermined.  No significant change from prior.  Echocardiogram 12/26/2020:  Normal LV systolic function with visual EF 60-65%. Left ventricle cavity  is normal in size. Mild left ventricular hypertrophy. D-shaped septum in  systole and diastole suggestive of RV pressure and volume overload.   Normal global wall motion. Doppler evidence of grade III (restrictive)  diastolic dysfunction, elevated LAP.  Left atrial cavity is mildly dilated. Atrial septum bows from right to  left, suggestive of elevated RAP.  Right atrial cavity is visually dilated.  Right ventricle cavity is visually dilated and normal function.  Mild (Grade I) aortic regurgitation.  Mild mitral valve annulus calcification. Moderate (Grade III) mitral  regurgitation.  Severe tricuspid regurgitation. Moderate pulmonary hypertension. RVSP  measures 51 mmHg (may  be unappreciated given RAE and dilated IVC leading  to equalization of pressures).  Moderate pulmonic regurgitation.  IVC is dilated with a respiratory response of <50%.  Compared to study 06/19/2020 G2DD is now G3DD,TR and PHTN remains stable  otherwise no significant change.   )       Anesthesia Quick Evaluation  Anesthesia Physical Anesthesia Plan  ASA: 3  Anesthesia Plan: MAC and Regional   Post-op Pain Management: Celebrex PO (pre-op)*   Induction:   PONV Risk Score and Plan: 2  Airway Management Planned: Natural Airway and Simple Face Mask  Additional Equipment:   Intra-op Plan:   Post-operative Plan: Extubation in OR  Informed Consent: I have reviewed the patients History and Physical, chart, labs and discussed the procedure including the risks, benefits and alternatives for the proposed anesthesia with the patient or authorized representative who has indicated his/her understanding and acceptance.     Dental advisory given  Plan Discussed with: Anesthesiologist and CRNA  Anesthesia Plan Comments: (PAT note written 01/28/2022 by Myra Gianotti, PA-C. )      Anesthesia Quick Evaluation

## 2022-01-28 NOTE — Progress Notes (Signed)
Anesthesia Chart Review: [name]  Case: [id number] Date/Time: 01/29/22 0853   Procedure: RIGHT ARM ARTERIOVENOUS (AV) FISTULA VERSUS ARTERIOVENOUS GRAFT CREATION (Right)   Anesthesia type: Choice   Pre-op diagnosis: ESRD   Location: [facility] OR ROOM 16 / [facility]   Surgeons: [name], MD       DISCUSSION: Patient is an 81 year old female scheduled for the above procedure. 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 Dialyzing TTS via right IJ catheter.    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 Einar Gip at [facility]) for history of moderate pulmonary hypertension, DOE, HFpEF (grade 3 DD on echo 12/2020), paroxysmal atrial fibrillation, moderate MR, severe TR. Admitted 08/2020 with influenza and found to have new onset atrial fibrillation.  She underwent successful direct-current cardioversion 01/14/2021.  When last seen for cardiology follow-up 05/12/2021 for hospital follow-up after admission for E. Coli UTI/bacteria and acute on chronic renal disease. She had resumed all her medications except Eliquis. If no contraindication per PCP then plan was to resume Eliquis given CHA2DS2-VASc score of 5.  Soon after she got admitted for AKI with hyperkalemia and again in February and required initiation of hemodialysis on 06/15/21. She in not currently on Eliquis.   She is a same day work-up, so anesthesia team to evaluate on the day of surgery.     VS:  BP Readings from Last 3 Encounters:  12/30/21 127/71  09/16/21 110/70  08/28/21 114/70   Pulse Readings from Last 3 Encounters:  12/30/21 81  09/16/21 82  08/28/21 68     PROVIDERS: Trey Sailors, [place] is PCP  Adrian Prows, MD is cardiologist. Established 06/02/20 for pulmonary hypertension,  LE edema, exertional dyspnea. She previously saw Quay Burow, MD in 2021 for edema.    LABS: For day of surgery.    IMAGES: 1V PCXR 08/04/2021: FINDINGS: Chronic cardiomegaly. Dialysis catheter on the right with tip at the SVC. Small right pleural effusion. No consolidation, pulmonary edema or pneumothorax. IMPRESSION: Chronic cardiomegaly and small right pleural effusion. No acute finding.   EKG: 06/12/2021: Wide QRS rhythm.  Rate 81.  Right bundle branch block.  Septal infarct, age undetermined.  No significant change from prior.   CV:  Echocardiogram 12/26/2020:  Normal LV systolic function with visual EF 60-65%. Left ventricle cavity  is normal in size. Mild left ventricular hypertrophy. D-shaped septum in  systole and diastole suggestive of RV pressure and volume overload.    Normal global wall motion. Doppler evidence of grade III (restrictive)  diastolic dysfunction, elevated LAP.  Left atrial cavity is mildly dilated. Atrial septum bows from right to  left, suggestive of elevated RAP.  Right atrial cavity is visually dilated.  Right ventricle cavity is visually dilated and normal function.  Mild (Grade I) aortic regurgitation.  Mild mitral valve annulus calcification. Moderate (Grade III) mitral  regurgitation.  Severe tricuspid regurgitation. Moderate pulmonary hypertension. RVSP  measures 51 mmHg (may be unappreciated given RAE and dilated IVC leading  to equalization of pressures).  Moderate pulmonic regurgitation.  IVC is dilated with a respiratory response of <50%.  Compared to study 06/19/2020 G2DD is now G3DD,TR and PHTN remains stable  otherwise no significant change.    Ambulatory cardiac telemetry 10/16/20-10/30/20:  Patient in atypical atrial flutter 100% of the time. Heart  rate 53-130 bpm with average 87 bpm. No symptoms reported. Rare PVCs.  Dr. Einar Gip assisted in interpretation of findings.   Lexiscan Tetrofosmin Stress Test  06/11/2020: Nondiagnostic  ECG stress. Perfusion imaging study demonstrates decreased uptake in the inferior wall suggestive of soft tissue attenuation.  Ischemia in this region cannot be completely excluded. Overall LV systolic function is normal without regional wall motion abnormalities. Stress LV EF: 82%. Low risk.     Past Medical History:  Diagnosis Date   CHF (congestive heart failure) (HCC)    Chronic kidney disease    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   Diabetes mellitus without complication (HCC)    type 2   Dyspnea    with exertion   Hypertension    Neuromuscular disorder (HCC)    neuropathy bil feet   PAF (paroxysmal atrial fibrillation) ([place]) 08/2020   Pneumonia    Pulmonary hypertension ([place])     Past Surgical History:  Procedure Laterality Date   AV FISTULA PLACEMENT Left 06/18/2021   Procedure: LEFT ARM ARTERIOVENOUS (AV) FISTULA CREATION;  Surgeon: [name], MD;  Location: [place];  Service: Vascular;  Laterality: Left;   [place] Left 08/28/2021   Procedure: LEFT ARM BASILIC VEIN TRANSPOSITION;  Surgeon: [name], MD;  Location: [place];  Service: Vascular;  Laterality: Left;   CARDIOVERSION N/A 01/14/2021   Procedure: CARDIOVERSION;  Surgeon: Adrian Prows, MD;  Location: [place];  Service: Cardiovascular;  Laterality: N/A;   IR FLUORO GUIDE CV LINE RIGHT  06/15/2021   IR US GUIDE VASC ACCESS RIGHT  06/15/2021   TONSILLECTOMY      MEDICATIONS: No current facility-administered medications for this encounter.    HUMULIN 70/30 KWIKPEN (70-30) 100 UNIT/ML KwikPen   Hydrocortisone (CORTIZONE-10 EX)   naproxen sodium (ALEVE) 220 MG tablet   Accu-Chek FastClix Lancets MISC   BD PEN NEEDLE NANO 2ND GEN 32G X 4 MM MISC   Blood Glucose Monitoring Suppl (ACCU-CHEK GUIDE ME) w/Device KIT   diltiazem (CARDIZEM CD) 180 MG 24 hr capsule   HYDROcodone-acetaminophen (NORCO/VICODIN) 5-325 MG tablet   pravastatin (PRAVACHOL) 20 MG tablet   sevelamer  carbonate (RENVELA) 800 MG tablet     Myra Gianotti, PA-C Surgical Short Stay/Anesthesiology [facility] Phone [phone] [facility] Phone [phone] 01/28/2022 6:35 PM

## 2022-01-29 ENCOUNTER — Encounter (HOSPITAL_COMMUNITY): Payer: Self-pay | Admitting: Vascular Surgery

## 2022-01-29 ENCOUNTER — Ambulatory Visit (HOSPITAL_BASED_OUTPATIENT_CLINIC_OR_DEPARTMENT_OTHER): Payer: Medicare HMO | Admitting: General Surgery

## 2022-01-29 ENCOUNTER — Other Ambulatory Visit: Payer: Self-pay

## 2022-01-29 ENCOUNTER — Ambulatory Visit (HOSPITAL_COMMUNITY)
Admission: RE | Admit: 2022-01-29 | Discharge: 2022-01-29 | Disposition: A | Discharge status: Home / Self Care | Payer: Medicare HMO | Source: Home / Self Care | Attending: Vascular Surgery | Admitting: Vascular Surgery

## 2022-01-29 ENCOUNTER — Ambulatory Visit (HOSPITAL_COMMUNITY): Payer: Medicare HMO | Admitting: Vascular Surgery

## 2022-01-29 ENCOUNTER — Ambulatory Visit (HOSPITAL_BASED_OUTPATIENT_CLINIC_OR_DEPARTMENT_OTHER): Payer: Medicare HMO | Admitting: Vascular Surgery

## 2022-01-29 ENCOUNTER — Encounter (HOSPITAL_COMMUNITY)
Admission: RE | Disposition: A | Discharge status: Home / Self Care | Payer: Self-pay | Source: Home / Self Care | Attending: Vascular Surgery

## 2022-01-29 DIAGNOSIS — I5033 Acute on chronic diastolic (congestive) heart failure: Secondary | ICD-10-CM | POA: Diagnosis not present

## 2022-01-29 DIAGNOSIS — Z794 Long term (current) use of insulin: Secondary | ICD-10-CM

## 2022-01-29 DIAGNOSIS — I088 Other rheumatic multiple valve diseases: Secondary | ICD-10-CM | POA: Insufficient documentation

## 2022-01-29 DIAGNOSIS — I272 Pulmonary hypertension, unspecified: Secondary | ICD-10-CM | POA: Insufficient documentation

## 2022-01-29 DIAGNOSIS — J90 Pleural effusion, not elsewhere classified: Secondary | ICD-10-CM | POA: Insufficient documentation

## 2022-01-29 DIAGNOSIS — I493 Ventricular premature depolarization: Secondary | ICD-10-CM | POA: Insufficient documentation

## 2022-01-29 DIAGNOSIS — T82868A Thrombosis of vascular prosthetic devices, implants and grafts, initial encounter: Secondary | ICD-10-CM | POA: Insufficient documentation

## 2022-01-29 DIAGNOSIS — I5030 Unspecified diastolic (congestive) heart failure: Secondary | ICD-10-CM | POA: Insufficient documentation

## 2022-01-29 DIAGNOSIS — X58XXXA Exposure to other specified factors, initial encounter: Secondary | ICD-10-CM | POA: Insufficient documentation

## 2022-01-29 DIAGNOSIS — I132 Hypertensive heart and chronic kidney disease with heart failure and with stage 5 chronic kidney disease, or end stage renal disease: Secondary | ICD-10-CM | POA: Diagnosis not present

## 2022-01-29 DIAGNOSIS — E875 Hyperkalemia: Secondary | ICD-10-CM | POA: Diagnosis not present

## 2022-01-29 DIAGNOSIS — E1122 Type 2 diabetes mellitus with diabetic chronic kidney disease: Secondary | ICD-10-CM | POA: Insufficient documentation

## 2022-01-29 DIAGNOSIS — D631 Anemia in chronic kidney disease: Secondary | ICD-10-CM

## 2022-01-29 DIAGNOSIS — I480 Paroxysmal atrial fibrillation: Secondary | ICD-10-CM | POA: Insufficient documentation

## 2022-01-29 DIAGNOSIS — I484 Atypical atrial flutter: Secondary | ICD-10-CM | POA: Insufficient documentation

## 2022-01-29 DIAGNOSIS — N185 Chronic kidney disease, stage 5: Secondary | ICD-10-CM | POA: Diagnosis not present

## 2022-01-29 DIAGNOSIS — N186 End stage renal disease: Secondary | ICD-10-CM | POA: Insufficient documentation

## 2022-01-29 DIAGNOSIS — Z992 Dependence on renal dialysis: Secondary | ICD-10-CM | POA: Insufficient documentation

## 2022-01-29 HISTORY — DX: Pulmonary hypertension, unspecified: I27.20

## 2022-01-29 HISTORY — DX: Heart failure, unspecified: I50.9

## 2022-01-29 HISTORY — PX: AV FISTULA PLACEMENT: SHX1204

## 2022-01-29 LAB — POCT I-STAT, CHEM 8
BUN: 17 mg/dL (ref 8–23)
Calcium, Ion: 1.23 mmol/L (ref 1.15–1.40)
Chloride: 97 mmol/L — ABNORMAL LOW (ref 98–111)
Creatinine, Ser: 2 mg/dL — ABNORMAL HIGH (ref 0.44–1.00)
Glucose, Bld: 126 mg/dL — ABNORMAL HIGH (ref 70–99)
HCT: 41 % (ref 36.0–46.0)
Hemoglobin: 13.9 g/dL (ref 12.0–15.0)
Potassium: 4.2 mmol/L (ref 3.5–5.1)
Sodium: 139 mmol/L (ref 135–145)
TCO2: 29 mmol/L (ref 22–32)

## 2022-01-29 LAB — GLUCOSE, CAPILLARY
Glucose-Capillary: 129 mg/dL — ABNORMAL HIGH (ref 70–99)
Glucose-Capillary: 122 mg/dL — ABNORMAL HIGH (ref 70–99)

## 2022-01-29 SURGERY — ARTERIOVENOUS (AV) FISTULA CREATION
Anesthesia: Monitor Anesthesia Care | Site: Arm Lower | Laterality: Right

## 2022-01-29 MED ORDER — ACETAMINOPHEN 500 MG PO TABS
ORAL_TABLET | ORAL | Status: AC
  Filled 2022-01-29: qty 2

## 2022-01-29 MED ORDER — LIDOCAINE 2% (20 MG/ML) 5 ML SYRINGE
INTRAMUSCULAR | Status: AC
  Filled 2022-01-29: qty 5

## 2022-01-29 MED ORDER — ACETAMINOPHEN 10 MG/ML IV SOLN
INTRAVENOUS | Status: AC
  Filled 2022-01-29: qty 100

## 2022-01-29 MED ORDER — MIDAZOLAM HCL 2 MG/2ML IJ SOLN
INTRAMUSCULAR | Status: AC
  Filled 2022-01-29: qty 2

## 2022-01-29 MED ORDER — PROPOFOL 500 MG/50ML IV EMUL
INTRAVENOUS | Status: DC | PRN
  Administered 2022-01-29: 25 ug/kg/min via INTRAVENOUS

## 2022-01-29 MED ORDER — FENTANYL CITRATE (PF) 100 MCG/2ML IJ SOLN
INTRAMUSCULAR | Status: AC
  Filled 2022-01-29: qty 2

## 2022-01-29 MED ORDER — SODIUM CHLORIDE 0.9 % IV SOLN: INTRAVENOUS | Status: DC | PRN

## 2022-01-29 MED ORDER — OXYCODONE-ACETAMINOPHEN 5-325 MG PO TABS: 1.0000 | ORAL_TABLET | ORAL | 0 refills | Status: DC | PRN

## 2022-01-29 MED ORDER — GLYCOPYRROLATE PF 0.2 MG/ML IJ SOSY
PREFILLED_SYRINGE | INTRAMUSCULAR | Status: AC
  Filled 2022-01-29: qty 1

## 2022-01-29 MED ORDER — GLYCOPYRROLATE 0.2 MG/ML IJ SOLN
INTRAMUSCULAR | Status: DC | PRN
  Administered 2022-01-29: .2 mg via INTRAVENOUS

## 2022-01-29 MED ORDER — ORAL CARE MOUTH RINSE: 15.0000 mL | Freq: Once | OROMUCOSAL | Status: AC

## 2022-01-29 MED ORDER — PHENYLEPHRINE HCL-NACL 20-0.9 MG/250ML-% IV SOLN
INTRAVENOUS | Status: DC | PRN
  Administered 2022-01-29: 50 ug/min via INTRAVENOUS

## 2022-01-29 MED ORDER — ACETAMINOPHEN 500 MG PO TABS: 1000.0000 mg | ORAL_TABLET | Freq: Once | ORAL | Status: DC

## 2022-01-29 MED ORDER — FENTANYL CITRATE (PF) 250 MCG/5ML IJ SOLN
INTRAMUSCULAR | Status: AC
  Filled 2022-01-29: qty 5

## 2022-01-29 MED ORDER — FENTANYL CITRATE PF 50 MCG/ML IJ SOSY
50.0000 ug | PREFILLED_SYRINGE | Freq: Once | INTRAMUSCULAR | Status: AC
  Administered 2022-01-29: 50 ug via INTRAVENOUS

## 2022-01-29 MED ORDER — SODIUM CHLORIDE 0.9 % IV SOLN: INTRAVENOUS | Status: DC

## 2022-01-29 MED ORDER — CHLORHEXIDINE GLUCONATE 0.12 % MT SOLN
OROMUCOSAL | Status: AC
  Administered 2022-01-29: 15 mL via OROMUCOSAL
  Filled 2022-01-29: qty 15

## 2022-01-29 MED ORDER — CHLORHEXIDINE GLUCONATE 4 % EX LIQD: 60.0000 mL | Freq: Once | CUTANEOUS | Status: DC

## 2022-01-29 MED ORDER — CEFAZOLIN SODIUM-DEXTROSE 2-4 GM/100ML-% IV SOLN
INTRAVENOUS | Status: AC
  Filled 2022-01-29: qty 100

## 2022-01-29 MED ORDER — LIDOCAINE HCL (PF) 1 % IJ SOLN
INTRAMUSCULAR | Status: AC
  Filled 2022-01-29: qty 30

## 2022-01-29 MED ORDER — ALBUMIN HUMAN 5 % IV SOLN
12.5000 g | Freq: Once | INTRAVENOUS | Status: AC
  Administered 2022-01-29: 12.5 g via INTRAVENOUS

## 2022-01-29 MED ORDER — LIDOCAINE HCL (CARDIAC) PF 100 MG/5ML IV SOSY
PREFILLED_SYRINGE | INTRAVENOUS | Status: DC | PRN
  Administered 2022-01-29: 50 mg via INTRAVENOUS

## 2022-01-29 MED ORDER — 0.9 % SODIUM CHLORIDE (POUR BTL) OPTIME
TOPICAL | Status: DC | PRN
  Administered 2022-01-29: 1000 mL

## 2022-01-29 MED ORDER — DEXAMETHASONE SODIUM PHOSPHATE 10 MG/ML IJ SOLN
INTRAMUSCULAR | Status: DC | PRN
  Administered 2022-01-29: 5 mg

## 2022-01-29 MED ORDER — LACTATED RINGERS IV SOLN: INTRAVENOUS | Status: DC

## 2022-01-29 MED ORDER — ACETAMINOPHEN 10 MG/ML IV SOLN
1000.0000 mg | Freq: Once | INTRAVENOUS | Status: AC
  Administered 2022-01-29: 1000 mg via INTRAVENOUS

## 2022-01-29 MED ORDER — HEPARIN SODIUM (PORCINE) 1000 UNIT/ML IJ SOLN
INTRAMUSCULAR | Status: AC
  Filled 2022-01-29: qty 10

## 2022-01-29 MED ORDER — ONDANSETRON HCL 4 MG/2ML IJ SOLN
INTRAMUSCULAR | Status: AC
  Filled 2022-01-29: qty 2

## 2022-01-29 MED ORDER — HEPARIN 6000 UNIT IRRIGATION SOLUTION
Status: DC | PRN
  Administered 2022-01-29: 1

## 2022-01-29 MED ORDER — CHLORHEXIDINE GLUCONATE 0.12 % MT SOLN: 15.0000 mL | Freq: Once | OROMUCOSAL | Status: AC

## 2022-01-29 MED ORDER — PHENYLEPHRINE HCL (PRESSORS) 10 MG/ML IV SOLN
INTRAVENOUS | Status: DC | PRN
  Administered 2022-01-29: 80 ug via INTRAVENOUS

## 2022-01-29 MED ORDER — HEPARIN SODIUM (PORCINE) 1000 UNIT/ML IJ SOLN
INTRAMUSCULAR | Status: DC | PRN
  Administered 2022-01-29: 3000 [IU] via INTRAVENOUS

## 2022-01-29 MED ORDER — HEPARIN 6000 UNIT IRRIGATION SOLUTION
Status: AC
  Filled 2022-01-29: qty 500

## 2022-01-29 MED ORDER — ALBUMIN HUMAN 5 % IV SOLN
INTRAVENOUS | Status: AC
  Filled 2022-01-29: qty 250

## 2022-01-29 MED ORDER — PROPOFOL 10 MG/ML IV BOLUS
INTRAVENOUS | Status: AC
  Filled 2022-01-29: qty 20

## 2022-01-29 MED ORDER — CEFAZOLIN SODIUM-DEXTROSE 2-4 GM/100ML-% IV SOLN
2.0000 g | INTRAVENOUS | Status: AC
  Administered 2022-01-29: 2 g via INTRAVENOUS

## 2022-01-29 MED ORDER — ONDANSETRON HCL 4 MG/2ML IJ SOLN
INTRAMUSCULAR | Status: DC | PRN
  Administered 2022-01-29: 4 mg via INTRAVENOUS

## 2022-01-29 MED ORDER — ROPIVACAINE HCL 5 MG/ML IJ SOLN
INTRAMUSCULAR | Status: DC | PRN
  Administered 2022-01-29: 25 mL via EPIDURAL

## 2022-01-29 SURGICAL SUPPLY — 31 items
ADH SKN CLS APL DERMABOND .7 (GAUZE/BANDAGES/DRESSINGS) ×1
ARMBAND PINK RESTRICT EXTREMIT (MISCELLANEOUS) ×1 IMPLANT
BAG COUNTER SPONGE SURGICOUNT (BAG) ×1 IMPLANT
BAG SPNG CNTER NS LX DISP (BAG) ×1
CANISTER SUCT 3000ML PPV (MISCELLANEOUS) ×1 IMPLANT
CLIP TI MEDIUM 6 (CLIP) IMPLANT
CLIP TI WIDE RED SMALL 6 (CLIP) IMPLANT
COVER PROBE W GEL 5X96 (DRAPES) IMPLANT
DERMABOND ADVANCED .7 DNX12 (GAUZE/BANDAGES/DRESSINGS) ×1 IMPLANT
ELECT REM PT RETURN 9FT ADLT (ELECTROSURGICAL) ×1
ELECTRODE REM PT RTRN 9FT ADLT (ELECTROSURGICAL) ×1 IMPLANT
GLOVE BIO SURGEON STRL SZ7.5 (GLOVE) ×1 IMPLANT
GOWN STRL REUS W/ TWL LRG LVL3 (GOWN DISPOSABLE) ×2 IMPLANT
GOWN STRL REUS W/ TWL XL LVL3 (GOWN DISPOSABLE) ×1 IMPLANT
GOWN STRL REUS W/TWL LRG LVL3 (GOWN DISPOSABLE) ×2
GOWN STRL REUS W/TWL XL LVL3 (GOWN DISPOSABLE) ×1
INSERT FOGARTY SM (MISCELLANEOUS) IMPLANT
KIT BASIN OR (CUSTOM PROCEDURE TRAY) ×1 IMPLANT
KIT TURNOVER KIT B (KITS) ×1 IMPLANT
NS IRRIG 1000ML POUR BTL (IV SOLUTION) ×1 IMPLANT
PACK CV ACCESS (CUSTOM PROCEDURE TRAY) ×1 IMPLANT
PAD ARMBOARD 7.5X6 YLW CONV (MISCELLANEOUS) ×2 IMPLANT
SLING ARM FOAM STRAP LRG (SOFTGOODS) IMPLANT
SLING ARM FOAM STRAP MED (SOFTGOODS) IMPLANT
SUT MNCRL AB 4-0 PS2 18 (SUTURE) ×1 IMPLANT
SUT PROLENE 6 0 BV (SUTURE) ×1 IMPLANT
SUT VIC AB 3-0 SH 27 (SUTURE) ×1
SUT VIC AB 3-0 SH 27X BRD (SUTURE) ×1 IMPLANT
TOWEL GREEN STERILE (TOWEL DISPOSABLE) ×1 IMPLANT
UNDERPAD 30X36 HEAVY ABSORB (UNDERPADS AND DIAPERS) ×1 IMPLANT
WATER STERILE IRR 1000ML POUR (IV SOLUTION) ×1 IMPLANT

## 2022-01-29 NOTE — Discharge Instructions (Signed)
[facility]  Discharge Instructions  AV Fistula or Graft Surgery for Dialysis Access  Please refer to the following instructions for your post-procedure care. Your surgeon or physician assistant will discuss any changes with you.  Activity  You may drive the day following your surgery, if you are comfortable and no longer taking prescription pain medication. Resume full activity as the soreness in your incision resolves.  Bathing/Showering  You may shower after you go home. Keep your incision dry for 48 hours. Do not soak in a bathtub, hot tub, or swim until the incision heals completely. You may not shower if you have a hemodialysis catheter.  Incision Care  Clean your incision with mild soap and water after 48 hours. Pat the area dry with a clean towel. You do not need a bandage unless otherwise instructed. Do not apply any ointments or creams to your incision. You may have skin glue on your incision. Do not peel it off. It will come off on its own in about one week. Your arm may swell a bit after surgery. To reduce swelling use pillows to elevate your arm so it is above your heart. Your doctor will tell you if you need to lightly wrap your arm with an ACE bandage.  Diet  Resume your normal diet. There are not special food restrictions following this procedure. In order to heal from your surgery, it is CRITICAL to get adequate nutrition. Your body requires vitamins, minerals, and protein. Vegetables are the best source of vitamins and minerals. Vegetables also provide the perfect balance of protein. Processed food has little nutritional value, so try to avoid this.  Medications  Resume taking all of your medications. If your incision is causing pain, you may take over-the counter pain relievers such as acetaminophen (Tylenol). If you were prescribed a stronger pain medication, please be aware these medications can cause nausea and constipation. Prevent  nausea by taking the medication with a snack or meal. Avoid constipation by drinking plenty of fluids and eating foods with high amount of fiber, such as fruits, vegetables, and grains.  Do not take Tylenol if you are taking prescription pain medications.  Follow up Your surgeon may want to see you in the office following your access surgery. If so, this will be arranged at the time of your surgery.  Please call us immediately for any of the following conditions:  Increased pain, redness, drainage (pus) from your incision site Fever of 101 degrees or higher Severe or worsening pain at your incision site Hand pain or numbness.  Reduce your risk of vascular disease:  Stop smoking. If you would like help, call [facility] at 1-800-QUIT-NOW 718 720 0751) or [place] at [place] your cholesterol Maintain a desired weight Control your diabetes Keep your blood pressure down  Dialysis  It will take several weeks to several months for your new dialysis access to be ready for use. Your surgeon will determine when it is okay to use it. Your nephrologist will continue to direct your dialysis. You can continue to use your Permcath until your new access is ready for use.   01/29/2022 [name] [id number] 1941/01/26  Surgeon(s): Marty Heck, MD  Procedure(s): RIGHT ARM BRACHIOCEPHALIC ARTERIOVENOUS (AV) FISTULA CREATION   May stick graft immediately   May stick graft on designated area only:   x Do not stick fistula for 12 weeks    If you have any questions, please call the office at [phone].

## 2022-01-29 NOTE — Op Note (Signed)
OPERATIVE NOTE   PROCEDURE: right brachiocephalic arteriovenous fistula placement  PRE-OPERATIVE DIAGNOSIS: end stage renal disease  POST-OPERATIVE DIAGNOSIS: same as above   SURGEON: Marty Heck, MD  ASSISTANT(S): Judd Lien, PA  ANESTHESIA: regional  ESTIMATED BLOOD LOSS: <75 mL  FINDING(S): The cephalic vein was of good caliber after block at the antecubital fossa by evaluation with ultrasound in the OR.   Ultimately a right brachiocephalic arteriovenous fistula was created just below the antecubitum.  Palpable thrill.  Brisk radial signal at the wrist.  SPECIMEN(S):  none  INDICATIONS:   [name] is a 81 y.o. female who presents with end stage renal disease.  The patient is scheduled for right arm arteriovenous fistula placement.  The patient is aware the risks include but are not limited to: bleeding, infection, steal syndrome, nerve damage, ischemic monomelic neuropathy, failure to mature, and need for additional procedures.  The patient is aware of the risks of the procedure and elects to proceed forward.   DESCRIPTION: After full informed written consent was obtained from the patient, the patient was brought back to the operating room and placed supine upon the operating table.  Prior to induction, the patient received IV antibiotics.   After obtaining adequate anesthesia, the patient was then prepped and draped in the standard fashion for a right arm access procedure.  I turned my attention first to identifying the patient's cephalic vein and brachial artery.  Using SonoSite guidance, the location of these vessels were marked out on the skin.  These were of good caliber at the antecubital fossa.   I made a transverse incision at the level of the antecubitum and dissected through the subcutaneous tissue and fascia to gain exposure of the brachial artery.  This was noted to be 4 mm in diameter externally.  This was dissected out proximally and distally and  controlled with vessel loops .  I then dissected out the cephalic vein.  This was noted to be 3.5 mm in diameter externally.  The distal segment of the vein was ligated with a  2-0 silk, and the vein was transected.  The proximal segment was interrogated with serial dilators.  The vein accepted up to a 5 mm dilator without any difficulty.  I then instilled the heparinized saline into the vein and clamped it.  At this point, I reset my exposure of the brachial artery.  The patient was given 3,000 units IV heparin.  I then placed the artery under tension proximally and distally.  I made an arteriotomy with a #11 blade, and then I extended the arteriotomy with a Potts scissor.  I injected heparinized saline proximal and distal to this arteriotomy.  The vein was then sewn to the artery in an end-to-side configuration with a running stitch of 6-0 Prolene.  Prior to completing this anastomosis, I allowed the vein and artery to backbleed.  There was no evidence of clot from any vessels.  I completed the anastomosis in the usual fashion and then released all vessel loops and clamps.    There was a palpable thrill in the venous outflow, and there was a dopplerable radial signal.  At this point, I irrigated out the surgical wound.  There was no further active bleeding.  The subcutaneous tissue was reapproximated with a running stitch of 3-0 Vicryl.  The skin was then reapproximated with a running subcuticular stitch of 4-0 Monocryl.  The skin was then cleaned, dried, and reinforced with Dermabond.  The patient tolerated  this procedure well.   COMPLICATIONS: None  CONDITION: Stable   Marty Heck, MD [facility] Office: [place]   01/29/2022, 11:04 AM

## 2022-01-29 NOTE — Anesthesia Procedure Notes (Signed)
Procedure Name: MAC Date/Time: 01/29/2022 9:29 AM  Performed by: Claris Che, CRNAPre-anesthesia Checklist: Patient identified, Emergency Drugs available, Suction available, Patient being monitored and Timeout performed Patient Re-evaluated:Patient Re-evaluated prior to induction Oxygen Delivery Method: Simple face mask Dental Injury: Teeth and Oropharynx as per pre-operative assessment

## 2022-01-29 NOTE — Transfer of Care (Signed)
Immediate Anesthesia Transfer of Care Note  Patient: [name]  Procedure(s) Performed: RIGHT ARM BRACHIOCEPHALIC ARTERIOVENOUS (AV) FISTULA CREATION (Right: Arm Lower)  Patient Location: PACU  Anesthesia Type:MAC combined with regional for post-op pain  Level of Consciousness: drowsy, patient cooperative and responds to stimulation  Airway & Oxygen Therapy: Patient Spontanous Breathing  Post-op Assessment: Report given to RN, Post -op Vital signs reviewed and stable and Patient moving all extremities X 4  Post vital signs: Reviewed and stable  Last Vitals:  Vitals Value Taken Time  BP 96/72 01/29/22 1102  Temp    Pulse    Resp 14 01/29/22 1105  SpO2    Vitals shown include unvalidated device data.  Last Pain:  Vitals:   01/29/22 0801  TempSrc:   PainSc: 0-No pain      Patients Stated Pain Goal: 0 (35/68/61 [id number])  Complications: No notable events documented.

## 2022-01-29 NOTE — Anesthesia Procedure Notes (Signed)
Anesthesia Regional Block: Interscalene brachial plexus block   Pre-Anesthetic Checklist: , timeout performed,  Correct Patient, Correct Site, Correct Laterality,  Correct Procedure, Correct Position, site marked,  Risks and benefits discussed,  Surgical consent,  Pre-op evaluation,  At surgeon's request and post-op pain management  Laterality: Right  Prep: chloraprep       Needles:  Injection technique: Single-shot  Needle Type: Echogenic Stimulator Needle     Needle Length: 5cm  Needle Gauge: 22     Additional Needles:   Narrative:  Start time: 01/29/2022 8:51 AM End time: 01/29/2022 8:41 AM Injection made incrementally with aspirations every 5 mL.  Performed by: Personally  Anesthesiologist: Duane Boston, MD  Additional Notes: Functioning IV was confirmed and monitors applied.  A 72mm 22ga echogenic arrow stimulator was used. Sterile prep and drape,hand hygiene and sterile gloves were used.Ultrasound guidance: relevant anatomy identified, needle position confirmed, local anesthetic spread visualized around nerve(s)., vascular puncture avoided.  Image printed for medical record.  Negative aspiration and negative test dose prior to incremental administration of local anesthetic. The patient tolerated the procedure well.

## 2022-01-29 NOTE — Anesthesia Postprocedure Evaluation (Signed)
Anesthesia Post Note  Patient: [name]  Procedure(s) Performed: RIGHT ARM BRACHIOCEPHALIC ARTERIOVENOUS (AV) FISTULA CREATION (Right: Arm Lower)     Patient location during evaluation: PACU Anesthesia Type: Regional Level of consciousness: awake and alert Pain management: pain level controlled Vital Signs Assessment: post-procedure vital signs reviewed and stable Respiratory status: spontaneous breathing and respiratory function stable Cardiovascular status: stable Postop Assessment: no apparent nausea or vomiting Anesthetic complications: no   No notable events documented.  Last Vitals:  Vitals:   01/29/22 1132 01/29/22 1147  BP: 101/63 106/67  Pulse: 78 76  Resp: 14 13  Temp:  36.6 C  SpO2: 99% 98%    Last Pain:  Vitals:   01/29/22 1102  TempSrc:   PainSc: 0-No pain                 Latifa Noble DANIEL

## 2022-01-29 NOTE — Interval H&P Note (Signed)
History and Physical Interval Note:  01/29/2022 7:21 AM  [name]  has presented today for surgery, with the diagnosis of ESRD.  The various methods of treatment have been discussed with the patient and family. After consideration of risks, benefits and other options for treatment, the patient has consented to  Procedure(s): RIGHT ARM ARTERIOVENOUS (AV) FISTULA VERSUS ARTERIOVENOUS GRAFT CREATION (Right) as a surgical intervention.  The patient's history has been reviewed, patient examined, no change in status, stable for surgery.  I have reviewed the patient's chart and labs.  Questions were answered to the patient's satisfaction.     Servando Snare

## 2022-01-30 ENCOUNTER — Emergency Department (HOSPITAL_COMMUNITY): Payer: Medicare HMO

## 2022-01-30 ENCOUNTER — Inpatient Hospital Stay (HOSPITAL_COMMUNITY): Payer: Medicare HMO

## 2022-01-30 ENCOUNTER — Inpatient Hospital Stay (HOSPITAL_COMMUNITY)
Admission: EM | Admit: 2022-01-30 | Discharge: 2022-02-05 | DRG: 264 | Disposition: A | Discharge status: Skilled Nursing Facility | Payer: Medicare HMO | Attending: Internal Medicine | Admitting: Internal Medicine

## 2022-01-30 ENCOUNTER — Other Ambulatory Visit: Payer: Self-pay

## 2022-01-30 ENCOUNTER — Encounter (HOSPITAL_COMMUNITY): Payer: Self-pay | Admitting: Emergency Medicine

## 2022-01-30 ENCOUNTER — Other Ambulatory Visit (HOSPITAL_COMMUNITY): Payer: Medicare HMO

## 2022-01-30 DIAGNOSIS — D631 Anemia in chronic kidney disease: Secondary | ICD-10-CM | POA: Diagnosis present

## 2022-01-30 DIAGNOSIS — E872 Acidosis, unspecified: Secondary | ICD-10-CM | POA: Diagnosis not present

## 2022-01-30 DIAGNOSIS — I639 Cerebral infarction, unspecified: Secondary | ICD-10-CM | POA: Diagnosis not present

## 2022-01-30 DIAGNOSIS — R569 Unspecified convulsions: Secondary | ICD-10-CM | POA: Diagnosis not present

## 2022-01-30 DIAGNOSIS — E876 Hypokalemia: Secondary | ICD-10-CM | POA: Diagnosis not present

## 2022-01-30 DIAGNOSIS — N3000 Acute cystitis without hematuria: Secondary | ICD-10-CM

## 2022-01-30 DIAGNOSIS — Z8673 Personal history of transient ischemic attack (TIA), and cerebral infarction without residual deficits: Secondary | ICD-10-CM

## 2022-01-30 DIAGNOSIS — G9340 Encephalopathy, unspecified: Secondary | ICD-10-CM | POA: Diagnosis not present

## 2022-01-30 DIAGNOSIS — G9341 Metabolic encephalopathy: Secondary | ICD-10-CM | POA: Diagnosis not present

## 2022-01-30 DIAGNOSIS — I5032 Chronic diastolic (congestive) heart failure: Secondary | ICD-10-CM | POA: Diagnosis present

## 2022-01-30 DIAGNOSIS — Y838 Other surgical procedures as the cause of abnormal reaction of the patient, or of later complication, without mention of misadventure at the time of the procedure: Secondary | ICD-10-CM | POA: Diagnosis present

## 2022-01-30 DIAGNOSIS — E785 Hyperlipidemia, unspecified: Secondary | ICD-10-CM | POA: Diagnosis present

## 2022-01-30 DIAGNOSIS — E1165 Type 2 diabetes mellitus with hyperglycemia: Secondary | ICD-10-CM | POA: Diagnosis present

## 2022-01-30 DIAGNOSIS — E1129 Type 2 diabetes mellitus with other diabetic kidney complication: Secondary | ICD-10-CM | POA: Diagnosis present

## 2022-01-30 DIAGNOSIS — I2489 Other forms of acute ischemic heart disease: Secondary | ICD-10-CM | POA: Diagnosis not present

## 2022-01-30 DIAGNOSIS — I4891 Unspecified atrial fibrillation: Secondary | ICD-10-CM

## 2022-01-30 DIAGNOSIS — E875 Hyperkalemia: Secondary | ICD-10-CM | POA: Diagnosis present

## 2022-01-30 DIAGNOSIS — N186 End stage renal disease: Secondary | ICD-10-CM

## 2022-01-30 DIAGNOSIS — F0390 Unspecified dementia without behavioral disturbance: Secondary | ICD-10-CM | POA: Diagnosis present

## 2022-01-30 DIAGNOSIS — B952 Enterococcus as the cause of diseases classified elsewhere: Secondary | ICD-10-CM | POA: Diagnosis not present

## 2022-01-30 DIAGNOSIS — T82868A Thrombosis of vascular prosthetic devices, implants and grafts, initial encounter: Secondary | ICD-10-CM | POA: Diagnosis present

## 2022-01-30 DIAGNOSIS — R571 Hypovolemic shock: Secondary | ICD-10-CM | POA: Diagnosis not present

## 2022-01-30 DIAGNOSIS — I9589 Other hypotension: Secondary | ICD-10-CM | POA: Diagnosis not present

## 2022-01-30 DIAGNOSIS — Z794 Long term (current) use of insulin: Secondary | ICD-10-CM | POA: Diagnosis not present

## 2022-01-30 DIAGNOSIS — R338 Other retention of urine: Secondary | ICD-10-CM

## 2022-01-30 DIAGNOSIS — I132 Hypertensive heart and chronic kidney disease with heart failure and with stage 5 chronic kidney disease, or end stage renal disease: Secondary | ICD-10-CM | POA: Diagnosis present

## 2022-01-30 DIAGNOSIS — I272 Pulmonary hypertension, unspecified: Secondary | ICD-10-CM | POA: Diagnosis present

## 2022-01-30 DIAGNOSIS — R7989 Other specified abnormal findings of blood chemistry: Secondary | ICD-10-CM

## 2022-01-30 DIAGNOSIS — E1122 Type 2 diabetes mellitus with diabetic chronic kidney disease: Secondary | ICD-10-CM | POA: Diagnosis present

## 2022-01-30 DIAGNOSIS — Z888 Allergy status to other drugs, medicaments and biological substances status: Secondary | ICD-10-CM

## 2022-01-30 DIAGNOSIS — N390 Urinary tract infection, site not specified: Secondary | ICD-10-CM

## 2022-01-30 DIAGNOSIS — Z79899 Other long term (current) drug therapy: Secondary | ICD-10-CM

## 2022-01-30 DIAGNOSIS — D696 Thrombocytopenia, unspecified: Secondary | ICD-10-CM | POA: Diagnosis present

## 2022-01-30 DIAGNOSIS — Z992 Dependence on renal dialysis: Secondary | ICD-10-CM | POA: Diagnosis not present

## 2022-01-30 DIAGNOSIS — I480 Paroxysmal atrial fibrillation: Secondary | ICD-10-CM | POA: Diagnosis present

## 2022-01-30 LAB — CBC WITH DIFFERENTIAL/PLATELET
Abs Immature Granulocytes: 0.04 10*3/uL (ref 0.00–0.07)
Basophils Absolute: 0 10*3/uL (ref 0.0–0.1)
Basophils Relative: 0 %
Eosinophils Absolute: 0 10*3/uL (ref 0.0–0.5)
Eosinophils Relative: 0 %
HCT: 37.5 % (ref 36.0–46.0)
Hemoglobin: 11.8 g/dL — ABNORMAL LOW (ref 12.0–15.0)
Immature Granulocytes: 1 %
Lymphocytes Relative: 16 %
Lymphs Abs: 0.7 10*3/uL (ref 0.7–4.0)
MCH: 29.7 pg (ref 26.0–34.0)
MCHC: 31.5 g/dL (ref 30.0–36.0)
MCV: 94.5 fL (ref 80.0–100.0)
Monocytes Absolute: 0.4 10*3/uL (ref 0.1–1.0)
Monocytes Relative: 9 %
Neutro Abs: 3.3 10*3/uL (ref 1.7–7.7)
Neutrophils Relative %: 74 %
Platelets: 78 10*3/uL — ABNORMAL LOW (ref 150–400)
RBC: 3.97 MIL/uL (ref 3.87–5.11)
RDW: 14.9 % (ref 11.5–15.5)
WBC: 4.5 10*3/uL (ref 4.0–10.5)
nRBC: 0 % (ref 0.0–0.2)

## 2022-01-30 LAB — GLUCOSE, CAPILLARY
Glucose-Capillary: 76 mg/dL (ref 70–99)
Glucose-Capillary: 83 mg/dL (ref 70–99)
Glucose-Capillary: 98 mg/dL (ref 70–99)

## 2022-01-30 LAB — BASIC METABOLIC PANEL
Anion gap: 14 (ref 5–15)
BUN: 28 mg/dL — ABNORMAL HIGH (ref 8–23)
CO2: 21 mmol/L — ABNORMAL LOW (ref 22–32)
Calcium: 8.7 mg/dL — ABNORMAL LOW (ref 8.9–10.3)
Chloride: 101 mmol/L (ref 98–111)
Creatinine, Ser: 3.06 mg/dL — ABNORMAL HIGH (ref 0.44–1.00)
GFR, Estimated: 15 mL/min — ABNORMAL LOW (ref >60)
Glucose, Bld: 229 mg/dL — ABNORMAL HIGH (ref 70–99)
Potassium: 4.8 mmol/L (ref 3.5–5.1)
Sodium: 136 mmol/L (ref 135–145)

## 2022-01-30 LAB — URINALYSIS, ROUTINE W REFLEX MICROSCOPIC
Bilirubin Urine: NEGATIVE
Glucose, UA: NEGATIVE mg/dL
Ketones, ur: NEGATIVE mg/dL
Nitrite: NEGATIVE
Protein, ur: 100 mg/dL — AB
Specific Gravity, Urine: 1.016 (ref 1.005–1.030)
WBC, UA: >50 WBC/hpf — ABNORMAL HIGH (ref 0–5)
pH: 5 (ref 5.0–8.0)

## 2022-01-30 LAB — CBG MONITORING, ED
Glucose-Capillary: 192 mg/dL — ABNORMAL HIGH (ref 70–99)
Glucose-Capillary: 170 mg/dL — ABNORMAL HIGH (ref 70–99)
Glucose-Capillary: 115 mg/dL — ABNORMAL HIGH (ref 70–99)
Glucose-Capillary: 119 mg/dL — ABNORMAL HIGH (ref 70–99)

## 2022-01-30 LAB — CBC
HCT: 30.4 % — ABNORMAL LOW (ref 36.0–46.0)
Hemoglobin: 9.9 g/dL — ABNORMAL LOW (ref 12.0–15.0)
MCH: 30.1 pg (ref 26.0–34.0)
MCHC: 32.6 g/dL (ref 30.0–36.0)
MCV: 92.4 fL (ref 80.0–100.0)
Platelets: 76 10*3/uL — ABNORMAL LOW (ref 150–400)
RBC: 3.29 MIL/uL — ABNORMAL LOW (ref 3.87–5.11)
RDW: 14.8 % (ref 11.5–15.5)
WBC: 4.4 10*3/uL (ref 4.0–10.5)
nRBC: 0 % (ref 0.0–0.2)

## 2022-01-30 LAB — COMPREHENSIVE METABOLIC PANEL
ALT: 10 U/L (ref 0–44)
AST: 23 U/L (ref 15–41)
Albumin: 3.6 g/dL (ref 3.5–5.0)
Alkaline Phosphatase: 94 U/L (ref 38–126)
Anion gap: 16 — ABNORMAL HIGH (ref 5–15)
BUN: 24 mg/dL — ABNORMAL HIGH (ref 8–23)
CO2: 22 mmol/L (ref 22–32)
Calcium: 9.5 mg/dL (ref 8.9–10.3)
Chloride: 99 mmol/L (ref 98–111)
Creatinine, Ser: 2.87 mg/dL — ABNORMAL HIGH (ref 0.44–1.00)
GFR, Estimated: 16 mL/min — ABNORMAL LOW (ref >60)
Glucose, Bld: 287 mg/dL — ABNORMAL HIGH (ref 70–99)
Potassium: 5.6 mmol/L — ABNORMAL HIGH (ref 3.5–5.1)
Sodium: 137 mmol/L (ref 135–145)
Total Bilirubin: 1.5 mg/dL — ABNORMAL HIGH (ref 0.3–1.2)
Total Protein: 7.5 g/dL (ref 6.5–8.1)

## 2022-01-30 LAB — MRSA NEXT GEN BY PCR, NASAL: MRSA by PCR Next Gen: NOT DETECTED

## 2022-01-30 LAB — TROPONIN I (HIGH SENSITIVITY)
Troponin I (High Sensitivity): 22 ng/L — ABNORMAL HIGH (ref <18)
Troponin I (High Sensitivity): 24 ng/L — ABNORMAL HIGH (ref <18)

## 2022-01-30 LAB — BRAIN NATRIURETIC PEPTIDE: B Natriuretic Peptide: 1432.2 pg/mL — ABNORMAL HIGH (ref 0.0–100.0)

## 2022-01-30 LAB — HEMOGLOBIN A1C
Hgb A1c MFr Bld: 8.2 % — ABNORMAL HIGH (ref 4.8–5.6)
Mean Plasma Glucose: 188.64 mg/dL

## 2022-01-30 LAB — LACTIC ACID, PLASMA
Lactic Acid, Venous: 3 mmol/L (ref 0.5–1.9)
Lactic Acid, Venous: 2.4 mmol/L (ref 0.5–1.9)

## 2022-01-30 MED ORDER — SODIUM CHLORIDE 0.9 % IV BOLUS
250.0000 mL | Freq: Once | INTRAVENOUS | Status: AC
  Administered 2022-01-30: 250 mL via INTRAVENOUS

## 2022-01-30 MED ORDER — INSULIN ASPART 100 UNIT/ML IJ SOLN: 0.0000 [IU] | INTRAMUSCULAR | Status: DC

## 2022-01-30 MED ORDER — INSULIN ASPART 100 UNIT/ML IJ SOLN
5.0000 [IU] | Freq: Once | INTRAMUSCULAR | Status: AC
  Administered 2022-01-30: 5 [IU] via INTRAVENOUS

## 2022-01-30 MED ORDER — INSULIN ASPART 100 UNIT/ML IJ SOLN: 0.0000 [IU] | Freq: Every day | INTRAMUSCULAR | Status: DC

## 2022-01-30 MED ORDER — ACETAMINOPHEN 650 MG RE SUPP: 650.0000 mg | RECTAL | Status: DC | PRN

## 2022-01-30 MED ORDER — SODIUM CHLORIDE 0.9 % IV SOLN
250.0000 mg | Freq: Two times a day (BID) | INTRAVENOUS | Status: DC
  Administered 2022-01-30 – 2022-01-31 (×4): 250 mg via INTRAVENOUS
  Filled 2022-01-30 (×6): qty 2.5

## 2022-01-30 MED ORDER — SODIUM ZIRCONIUM CYCLOSILICATE 10 G PO PACK: 10.0000 g | PACK | ORAL | Status: DC

## 2022-01-30 MED ORDER — ONDANSETRON HCL 4 MG/2ML IJ SOLN
4.0000 mg | Freq: Once | INTRAMUSCULAR | Status: AC
  Administered 2022-01-30: 4 mg via INTRAVENOUS
  Filled 2022-01-30: qty 2

## 2022-01-30 MED ORDER — INSULIN ASPART 100 UNIT/ML IJ SOLN
0.0000 [IU] | INTRAMUSCULAR | Status: DC
  Administered 2022-01-30 – 2022-02-01 (×3): 1 [IU] via SUBCUTANEOUS

## 2022-01-30 MED ORDER — LACTATED RINGERS IV BOLUS
500.0000 mL | Freq: Once | INTRAVENOUS | Status: AC
  Administered 2022-01-30: 500 mL via INTRAVENOUS

## 2022-01-30 MED ORDER — STROKE: EARLY STAGES OF RECOVERY BOOK
Freq: Once | Status: AC
  Filled 2022-01-30: qty 1

## 2022-01-30 MED ORDER — DEXTROSE 50 % IV SOLN
12.5000 g | Freq: Once | INTRAVENOUS | Status: AC
  Administered 2022-01-30: 12.5 g via INTRAVENOUS
  Filled 2022-01-30: qty 50

## 2022-01-30 MED ORDER — LORAZEPAM 2 MG/ML IJ SOLN
INTRAMUSCULAR | Status: AC
  Filled 2022-01-30: qty 1

## 2022-01-30 MED ORDER — FUROSEMIDE 10 MG/ML IJ SOLN
40.0000 mg | Freq: Once | INTRAMUSCULAR | Status: AC
  Administered 2022-01-30: 40 mg via INTRAVENOUS
  Filled 2022-01-30: qty 4

## 2022-01-30 MED ORDER — SODIUM CHLORIDE 0.9 % IV SOLN
1.0000 g | Freq: Once | INTRAVENOUS | Status: AC
  Administered 2022-01-30: 1 g via INTRAVENOUS
  Filled 2022-01-30: qty 10

## 2022-01-30 MED ORDER — SODIUM CHLORIDE 0.9 % IV SOLN
1.0000 g | INTRAVENOUS | Status: DC
  Administered 2022-01-31 – 2022-02-01 (×2): 1 g via INTRAVENOUS
  Filled 2022-01-30 (×2): qty 10

## 2022-01-30 MED ORDER — ACETAMINOPHEN 160 MG/5ML PO SOLN: 650.0000 mg | ORAL | Status: DC | PRN

## 2022-01-30 MED ORDER — CHLORHEXIDINE GLUCONATE CLOTH 2 % EX PADS
6.0000 | MEDICATED_PAD | Freq: Every day | CUTANEOUS | Status: DC
  Administered 2022-01-31 – 2022-02-01 (×2): 6 via TOPICAL

## 2022-01-30 MED ORDER — LEVETIRACETAM IN NACL 1000 MG/100ML IV SOLN
1000.0000 mg | INTRAVENOUS | Status: AC
  Administered 2022-01-30: 1000 mg via INTRAVENOUS
  Filled 2022-01-30: qty 100

## 2022-01-30 MED ORDER — SODIUM CHLORIDE 0.9 % IV SOLN
250.0000 mL | INTRAVENOUS | Status: DC
  Administered 2022-01-30: 250 mL via INTRAVENOUS

## 2022-01-30 MED ORDER — ACETAMINOPHEN 325 MG PO TABS: 650.0000 mg | ORAL_TABLET | ORAL | Status: DC | PRN

## 2022-01-30 MED ORDER — INSULIN ASPART 100 UNIT/ML IJ SOLN: 0.0000 [IU] | Freq: Three times a day (TID) | INTRAMUSCULAR | Status: DC

## 2022-01-30 MED ORDER — NOREPINEPHRINE 4 MG/250ML-% IV SOLN
2.0000 ug/min | INTRAVENOUS | Status: DC
  Administered 2022-01-30: 2 ug/min via INTRAVENOUS
  Filled 2022-01-30: qty 250

## 2022-01-30 NOTE — ED Notes (Addendum)
Bladder scanned 556ml shown

## 2022-01-30 NOTE — ED Triage Notes (Signed)
Pt BIB EMS from home following episode of unresponsiveness, family states they helped her into bed and came back 15 minutes later and pt was unresponsive. Initially alert to verbal with EMS. Family states the pts current speech is normal. CBG 353. Alert to self at baseline. Fistula placed today in R extremity. Tuesday, Thursday, Saturday dialysis due for treatment today.

## 2022-01-30 NOTE — Progress Notes (Signed)
Attempted to get report from ED 

## 2022-01-30 NOTE — ED Provider Notes (Addendum)
[facility] EMERGENCY DEPARTMENT Provider Note   CSN: [id number] Arrival date & time: 01/30/22  0001     History  Chief Complaint  Patient presents with   Loss of Consciousness   Seizures    [name] is a 81 y.o. female.  The history is provided by the EMS personnel. The history is limited by the condition of the patient (level 5 caveat dementia).  Loss of Consciousness Episode history:  Single Timing:  Constant Progression:  Resolved Chronicity:  New Context: not sight of blood and not urination   Witnessed: no   Associated symptoms: seizures   Associated symptoms: no fever and no vomiting   Seizures Seizure activity on arrival: yes (happened after my exam in the room 4 minutes duration)   Seizure type:  Grand mal Initial focality:  None Episode characteristics: abnormal movements and unresponsiveness   Return to baseline: yes   Severity:  Moderate Duration:  4 minutes Timing:  Once Progression:  Resolved Context: not alcohol withdrawal and not fever        Home Medications Prior to Admission medications   Medication Sig Start Date End Date Taking? Authorizing Provider  HUMULIN 70/30 KWIKPEN (70-30) 100 UNIT/ML KwikPen Inject 5 Units into the skin daily. Patient taking differently: Inject 10 Units into the skin at bedtime. 05/28/21  Yes Regalado, Cassie Freer, MD  Accu-Chek FastClix Lancets MISC See admin instructions. 01/16/19   [provider]  BD PEN NEEDLE NANO 2ND GEN 32G X 4 MM MISC 3 (three) times daily. as directed 08/13/19   [provider]  Blood Glucose Monitoring Suppl (ACCU-CHEK GUIDE ME) w/Device KIT See admin instructions. 12/11/18   [provider]  diltiazem (CARDIZEM CD) 180 MG 24 hr capsule TAKE 1 CAPSULE(180 MG) BY MOUTH DAILY Patient not taking: Reported on 12/30/2021 07/29/21   Cantwell, Anderson Malta C, PA-C  HYDROcodone-acetaminophen (NORCO/VICODIN) 5-325 MG tablet Take 1 tablet by mouth every 6 (six)  hours as needed for moderate pain. Patient not taking: Reported on 12/30/2021 08/28/21   Ulyses Amor, PA-C  Hydrocortisone (JGOTLXBWI-20 EX) Apply 1 application. topically 3 (three) times daily as needed (itching).    [provider]  naproxen sodium (ALEVE) 220 MG tablet Take 220 mg by mouth at bedtime as needed (foot pain).    [provider]  oxyCODONE-acetaminophen (PERCOCET) 5-325 MG tablet Take 1 tablet by mouth every 4 (four) hours as needed for severe pain. 01/29/22 01/29/23  Schuh, McKenzi P, PA-C  pravastatin (PRAVACHOL) 20 MG tablet Take 1 tablet (20 mg total) by mouth daily. Patient not taking: Reported on 12/30/2021 05/12/21   Lawerance Cruel C, PA-C  sevelamer carbonate (RENVELA) 800 MG tablet Take 800 mg by mouth 3 (three) times daily with meals. Patient not taking: Reported on 01/28/2022    [provider]      Allergies    Amlodipine    Review of Systems   Review of Systems  Unable to perform ROS: Dementia  Constitutional:  Negative for fever.  Eyes:  Negative for redness.  Respiratory:  Negative for wheezing and stridor.   Cardiovascular:  Positive for syncope.  Gastrointestinal:  Negative for vomiting.  Neurological:  Positive for seizures.    Physical Exam Updated Vital Signs BP 135/72   Pulse 85   Temp 97.9 F (36.6 C) (Oral)   Resp 19   Ht _0  (1.626 m)   Wt 69.4 kg   SpO2 99%   BMI 26.26 kg/m  Physical  Exam Vitals and nursing note reviewed.  Constitutional:      General: She is not in acute distress.    Appearance: She is well-developed.  HENT:     Head: Normocephalic and atraumatic.     Nose: Nose normal.  Eyes:     Extraocular Movements: Extraocular movements intact.     Pupils: Pupils are equal, round, and reactive to light.  Cardiovascular:     Rate and Rhythm: Normal rate and regular rhythm.     Pulses: Normal pulses.     Heart sounds: Normal heart sounds.  Pulmonary:     Effort: Pulmonary effort is normal.  No respiratory distress.     Breath sounds: Normal breath sounds.  Abdominal:     General: Bowel sounds are normal. There is no distension.     Palpations: Abdomen is soft.     Tenderness: There is no abdominal tenderness. There is no guarding or rebound.  Genitourinary:    Vagina: No vaginal discharge.  Musculoskeletal:        General: Normal range of motion.     Cervical back: Neck supple.     Right lower leg: Edema present.     Left lower leg: Edema present.  Skin:    General: Skin is warm and dry.     Capillary Refill: Capillary refill takes less than 2 seconds.     Findings: No erythema or rash.  Neurological:     Deep Tendon Reflexes: Reflexes normal.  Psychiatric:        Mood and Affect: Mood normal.    ED Results / Procedures / Treatments   Labs (all labs ordered are listed, but only abnormal results are displayed) Results for orders placed or performed during the hospital encounter of 01/30/22  CBC with Differential  Result Value Ref Range   WBC 4.5 4.0 - 10.5 K/uL   RBC 3.97 3.87 - 5.11 MIL/uL   Hemoglobin 11.8 (L) 12.0 - 15.0 g/dL   HCT 37.5 36.0 - 46.0 %   MCV 94.5 80.0 - 100.0 fL   MCH 29.7 26.0 - 34.0 pg   MCHC 31.5 30.0 - 36.0 g/dL   RDW 14.9 11.5 - 15.5 %   Platelets 78 (L) 150 - 400 K/uL   nRBC 0.0 0.0 - 0.2 %   Neutrophils Relative % 74 %   Neutro Abs 3.3 1.7 - 7.7 K/uL   Lymphocytes Relative 16 %   Lymphs Abs 0.7 0.7 - 4.0 K/uL   Monocytes Relative 9 %   Monocytes Absolute 0.4 0.1 - 1.0 K/uL   Eosinophils Relative 0 %   Eosinophils Absolute 0.0 0.0 - 0.5 K/uL   Basophils Relative 0 %   Basophils Absolute 0.0 0.0 - 0.1 K/uL   Immature Granulocytes 1 %   Abs Immature Granulocytes 0.04 0.00 - 0.07 K/uL  Comprehensive metabolic panel  Result Value Ref Range   Sodium 137 135 - 145 mmol/L   Potassium 5.6 (H) 3.5 - 5.1 mmol/L   Chloride 99 98 - 111 mmol/L   CO2 22 22 - 32 mmol/L   Glucose, Bld 287 (H) 70 - 99 mg/dL   BUN 24 (H) 8 - 23 mg/dL    Creatinine, Ser 2.87 (H) 0.44 - 1.00 mg/dL   Calcium 9.5 8.9 - 10.3 mg/dL   Total Protein 7.5 6.5 - 8.1 g/dL   Albumin 3.6 3.5 - 5.0 g/dL   AST 23 15 - 41 U/L   ALT 10 0 - 44 U/L  Alkaline Phosphatase 94 38 - 126 U/L   Total Bilirubin 1.5 (H) 0.3 - 1.2 mg/dL   GFR, Estimated 16 (L) >60 mL/min   Anion gap 16 (H) 5 - 15  Troponin I (High Sensitivity)  Result Value Ref Range   Troponin I (High Sensitivity) 22 (H) <18 ng/L   CT Head Wo Contrast  Result Date: 01/30/2022 CLINICAL DATA:  Patient found unresponsive EXAM: CT HEAD WITHOUT CONTRAST TECHNIQUE: Contiguous axial images were obtained from the base of the skull through the vertex without intravenous contrast. RADIATION DOSE REDUCTION: This exam was performed according to the departmental dose-optimization program which includes automated exposure control, adjustment of the mA and/or kV according to patient size and/or use of iterative reconstruction technique. COMPARISON:  None Available. FINDINGS: Brain: Ill-defined hypoattenuation within the cerebral white matter is nonspecific but consistent with chronic small vessel ischemic disease. This is asymmetrically increased within the right frontal lobe as well as within the right parietal lobe. No intracranial hemorrhage or mass effect. No hydrocephalus. No extra-axial fluid collection. Generalized cerebral atrophy. Vascular: No hyperdense vessel. Intracranial arterial calcification. Skull: No fracture or focal lesion. Sinuses/Orbits: No acute finding. Paranasal sinuses and mastoid air cells are well aerated. Other: None. IMPRESSION: Increased hypoattenuation within the right frontal and right parietal lobes concerning for acute infarcts with watershed distribution. MRI is recommended for further evaluation. Critical Value/emergent results were called by telephone at the time of interpretation on 01/30/2022 at 1:02 am to provider [facility] , who verbally acknowledged these results. Electronically  Signed   By: Placido Sou M.D.   On: 01/30/2022 01:04   DG Chest Portable 1 View  Result Date: 01/30/2022 CLINICAL DATA:  Altered mental status EXAM: PORTABLE CHEST 1 VIEW COMPARISON:  Radiographs 08/04/2021 FINDINGS: Stable cardiomegaly. Right IJ CVC tip at the superior cavoatrial junction. Aortic atherosclerotic calcification. No focal consolidation, pleural effusion, or pneumothorax. No acute osseous abnormality. IMPRESSION: Unchanged cardiomegaly.  No acute abnormality. Electronically Signed   By: Placido Sou M.D.   On: 01/30/2022 00:30      EKG EKG Interpretation  Date/Time:  Saturday January 30 2022 00:11:45 EDT Ventricular Rate:  80 PR Interval:    QRS Duration: 127 QT Interval:  381 QTC Calculation: 440 R Axis:   142 Text Interpretation: Atrial fibrillation Right bundle branch block Probable anteroseptal infarct, old Confirmed by Randal Buba, Connar Keating ([id number]) on 01/30/2022 12:15:57 AM  Radiology CT Head Wo Contrast  Result Date: 01/30/2022 CLINICAL DATA:  Patient found unresponsive EXAM: CT HEAD WITHOUT CONTRAST TECHNIQUE: Contiguous axial images were obtained from the base of the skull through the vertex without intravenous contrast. RADIATION DOSE REDUCTION: This exam was performed according to the departmental dose-optimization program which includes automated exposure control, adjustment of the mA and/or kV according to patient size and/or use of iterative reconstruction technique. COMPARISON:  None Available. FINDINGS: Brain: Ill-defined hypoattenuation within the cerebral white matter is nonspecific but consistent with chronic small vessel ischemic disease. This is asymmetrically increased within the right frontal lobe as well as within the right parietal lobe. No intracranial hemorrhage or mass effect. No hydrocephalus. No extra-axial fluid collection. Generalized cerebral atrophy. Vascular: No hyperdense vessel. Intracranial arterial calcification. Skull: No fracture or focal  lesion. Sinuses/Orbits: No acute finding. Paranasal sinuses and mastoid air cells are well aerated. Other: None. IMPRESSION: Increased hypoattenuation within the right frontal and right parietal lobes concerning for acute infarcts with watershed distribution. MRI is recommended for further evaluation. Critical Value/emergent results were called by telephone at  the time of interpretation on 01/30/2022 at 1:02 am to provider [facility] , who verbally acknowledged these results. Electronically Signed   By: Placido Sou M.D.   On: 01/30/2022 01:04   DG Chest Portable 1 View  Result Date: 01/30/2022 CLINICAL DATA:  Altered mental status EXAM: PORTABLE CHEST 1 VIEW COMPARISON:  Radiographs 08/04/2021 FINDINGS: Stable cardiomegaly. Right IJ CVC tip at the superior cavoatrial junction. Aortic atherosclerotic calcification. No focal consolidation, pleural effusion, or pneumothorax. No acute osseous abnormality. IMPRESSION: Unchanged cardiomegaly.  No acute abnormality. Electronically Signed   By: Placido Sou M.D.   On: 01/30/2022 00:30    Procedures Procedures    Medications Ordered in ED   ED Course/ Medical Decision Making/ A&P                           Medical Decision Making Patient presented for an unresponsive episode at home   Problems Addressed: Elevated troponin I level:    Details: Trend troponins upon admission  Hyperkalemia:    Details: Lokelma Seizures ([place]): acute illness or injury    Details: Neuro consult, EEG IV ativan and keppra and admission  Urinary tract infection without hematuria, site unspecified:    Details: Rocephin IV and admission   Amount and/or Complexity of Data Reviewed Independent Historian: EMS    Details: See above  External Data Reviewed: notes.    Details: Previous notes reviewed  Labs: ordered.    Details: All labs reviewed: normal white count 4.5, hemoglobin low 11.8, platelet count very low 78K.  Normal sodium 137, elevated potassium 5.6,  elevated creatinine 2.87.  Elevated bili 1.5 otherwise negative UTI.  Urine is consistent with UTI Radiology: ordered.    Details: No ICH on head CT by me  ECG/medicine tests: ordered and independent interpretation performed. Decision-making details documented in ED Course.  Risk Prescription drug management. Decision regarding hospitalization.    Final Clinical Impression(s) / ED Diagnoses Final diagnoses:  Seizures ([place])  Atrial fibrillation, unspecified type [facility])   The patient appears reasonably stabilized for admission considering the current resources, flow, and capabilities available in the ED at this time, and I doubt any other [facility] requiring further screening and/or treatment in the ED prior to admission.  Rx / DC Orders ED Discharge Orders     None           Pasqualina Colasurdo, MD 01/30/22 [phone]

## 2022-01-30 NOTE — Progress Notes (Signed)
[name] is a 81 year old female with past medical history of ESRD on HD, TTS schedule, chronic diastolic CHF  insulin-dependent type 2 diabetes, hypertension, hyperlipidemia, paroxysmal A-fib not on anticoagulation, chronic thrombocytopenia, anemia presents to the ED via EMS from home following episode of unresponsiveness.  Neurology was consulted. CT head showing increased hypoattenuation within the right frontal and right parietal lobes concerning for acute infarcts with watershed distribution.  MRI brain is still pending.  Patient seen and examined in the ED.  Patient was very lethargic but was protecting her airways.  Her last dose of Ativan was at least 9 hours before my evaluation.  Some medication related lethargy is almost ruled out.  Her blood pressure was also severely low in the morning going as low as 77/42.  After consultation with Dr. Joelyn Oms of nephrology, we gave her a small fluid bolus of 250 cc but that did not help improve her blood pressure.  I consulted PCCM.  They repeated small fluid bolus again but blood pressure still remains low 88/52.  PCCM is assuming the care of this patient, transferring to ICU for vasopressors.  Nephrology is already aware that patient is due for dialysis today.  Neurology will be following too.  We will be happy to take over patient's care once patient is medically stable to come to the medical floor.

## 2022-01-30 NOTE — Progress Notes (Signed)
PT Cancellation Note  Patient Details Name: [name] MRN: [id number] DOB: 03-22-1941   Cancelled Treatment:    Reason Eval/Treat Not Completed: Medical issues which prohibited therapy.. Low BP and is being medically managed, so will hold PT for now.  Retry when medically stable.   Ramond Dial 01/30/2022, 12:40 PM  Mee Hives, PT PhD Acute Rehab Dept. Number: [place] [facility] [place]

## 2022-01-30 NOTE — Progress Notes (Signed)
On arrival to ICU, LLE wound appeared like venous stasis. When son arrived, he stated she "scraped her leg on her wheelchair" recently. No patient belongings present upon arrival from ED. Daughter has patient belongings.

## 2022-01-30 NOTE — Progress Notes (Signed)
  Echocardiogram 2D Echocardiogram has been performed.  Merrie Roof F 01/30/2022, 6:26 PM

## 2022-01-30 NOTE — Consult Note (Signed)
NAME:  [name], MRN:  [id number], DOB:  [birth year], LOS: 0 ADMISSION DATE:  01/30/2022, CONSULTATION DATE:  01/30/22 REFERRING MD:  Dr. Doristine Bosworth, CHIEF COMPLAINT:  AMS   History of Present Illness:  HPI obtained from medical chart review as patient is unable to provide hx, no family in room.    81 year old female who presented from home on 9/29 with altered mental status.  Family reported patient became unresponsive as they were putting her to bed with a fixed gaze followed by stiffening and shaking for 2-3 mins.  At baseline, she is oriented x 1 and has questionable history of seizures prior to thought associated with hypoglycemia previously.  By EMS arrival, she was more alert with CBG in 300's.    In ER, she was afebrile, normotensive, and with normal saturations.  She was more responsive, interactive and talking but then had recurrent seizure, receiving ativan and Keppra with Neurology consulting.  CTH with concern of hypoattenuation in the right frontal and right parietal lobes concerning for acute infarcts with watershed distribution.  Other workup noted for UA with large leuks, many bacteria, WBC 50 (not new for patient and reportedly asymptomatic) started on ceftriaxone with UC sent, foley placed for urinary retention, and was given lasix in ER.  Labs with K 5.6, normal LFTs except t.bili 1.5, trop hs 22- 24, and BNP 1432, Hgb 11.8, plts 78 (at baseline), CXR non acute.  Since lasix, her blood pressure has continued to decline with mild and brief improvement after NS 250 ml bolus.  Her mental status mostly somnolent in ER, with no further clinical seizures reported.   PCCM consulted for ongoing hypotension.    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 rate 14, height '5\' 4"'  (1.626 m), weight 69.4 kg, SpO2 98 %.        Intake/Output Summary (Last 24 hours) at 01/30/2022 0933 Last data filed at 01/30/2022 0702 Gross per 24 hour  Intake 449.4 ml  Output --  Net 449.4 ml   Filed Weights   01/30/22 0007  Weight: 69.4 kg   Examination: General:  chronically ill appearing elderly female sitting upright on ER stretcher in NAD HEENT: MM pale/ dry, +JVD, pupils 4/sluggish Neuro: awakens easily to verbal, oriented to self, hospital, and year.  Follows simple commands, flaccid in RUE, LUE 4/5, R and LLE 3/5 CV: irir, +murmur PULM:  non labored, CTA, on room air GI: soft, bs+, NT/ ND, foley with dk amber urine Extremities: warm/dry, no LE edema, incision to right AC approximated/ glued, slight erythema but no drainage or warmth Skin: no rashes    Resolved Hospital Problem list    Assessment & Plan:   Hypotension> shock, undifferentiated at this time  - initially normotensive f/b progressive hypotension after lasix.  Looks dry.  Brief response to NS 250 bolus.  Will give another LR bolus 500 ml now but not too aggressive given hx of RV dysfunction/ PH> no response to fluids therefore will take to ICU and start peripheral NE  for MAP goal > 65 - pending lactate and trend if elevated -  concern for sepsis > CXR wnl, and UA consistent with prevoius UA's and reportedly asymptomatic.  Cont with ceftriaxone till UC,  although afebrile, normal WBC and differential.   - follow BC's  - other ddx for hypotension could be cardiac/ RV failure> echo/ cardiac workup as below   Encephalopathy concerning for seizures and r/o acute CVA ? Hx of dementia> reported baseline is oriented x1 - ?hx of seizures in past with hypoglycemia.   She has been hyperglycemic here - neuro following, feels CTH changes may be chronic - MRI when BP stable to further evaluate hypoattenuation, acute vs chronic CVA - serial neuro exams - EEG pending from overnight - AED per neurology, s/p keppra/ ativan in ER - seizure / aspiration precautions  - NPO till bedside swallow screen or while altered  - avoid hypoglycemia given prior concern for seizures thought related to hypoglycemia   ESRD on iHD, TTS Urinary retention Hyperkalemia- mild - has R TDC, R AVF placed 9/29 by VVS - no current emergent iHD needs.  K was 5.6 since 4.8.  Will likely need CRRT if hypotension ongoing - monitor renal indices closely  - continue foley for urinary retention - follow UC - [facility]phrology [facility] by [facility]   HFpEF Suspected PH - prior TTE 08/2020 with EF 55-60, indeterminate DD, RVSF mod reduced with PASP 68, mild to mod MR, severe TR - LFTs ok, trop flat 22-24, BNP 1432 which could be related to her chronic RV dysfunction/ PH - pending echo - caution with fluid shifts    Afib  - hold home diltiazem given hypotension - tele monitoring> afib with controlled rate, continue to monitor - goal K> 4, Mag > 2   IDDM, poorly controlled - A1c 8.2 - CBG q 4/ very sensitive SSI - avoid hypoglycemia   Chronic normocytic anemia Chronic thrombocytopenia - H/H and Plt stable from 08/2021 labs, continue to trend and monitor for bleeding   Best Practice (right click and "Reselect all SmartList Selections" daily)   Diet/type: NPO DVT prophylaxis: SCD GI prophylaxis: PPI Lines: N/A Foley:  Yes, and it is still needed Code Status:  full code Last date of multidisciplinary goals of care discussion [pending]  Attempted to reach [name] (daughter) by phone [phone], unable to reach at this time for update/ confirm [place].  Will attempt again soon.   Labs   CBC: Recent Labs  Lab 01/29/22 0741 01/30/22 0032 01/30/22 0714  WBC  --  4.5 4.4  NEUTROABS   --  3.3  --   HGB 13.9 11.8* 9.9*  HCT 41.0 37.5 30.4*  MCV  --  94.5 92.4  PLT  --  78* 76*    Basic Metabolic Panel: Recent Labs  Lab 01/29/22 0741 01/30/22 0032 01/30/22 0714  NA 139 137 136  K 4.2 5.6* 4.8  CL 97* 99 101  CO2  --  22 21*  GLUCOSE 126* 287* 229*  BUN 17 24* 28*  CREATININE 2.00* 2.87* 3.06*  CALCIUM  --  9.5 8.7*   GFR: Estimated Creatinine Clearance: 14 mL/min (A) (by C-G formula based on SCr of 3.06 mg/dL (H)). Recent Labs  Lab 01/30/22 0032 01/30/22 0714  WBC 4.5 4.4    Liver Function Tests: Recent Labs  Lab 01/30/22 0032  AST 23  ALT 10  ALKPHOS 94  BILITOT 1.5*  PROT 7.5  ALBUMIN 3.6   No results for input(s): "LIPASE", "  AMYLASE" in the last 168 hours. No results for input(s): "AMMONIA" in the last 168 hours.  ABG    Component Value Date/Time   TCO2 29 01/29/2022 0741     Coagulation Profile: No results for input(s): "INR", "PROTIME" in the last 168 hours.  Cardiac Enzymes: No results for input(s): "CKTOTAL", "CKMB", "CKMBINDEX", "TROPONINI" in the last 168 hours.  HbA1C: Hgb A1c MFr Bld  Date/Time Value Ref Range Status  01/30/2022 07:14 AM 8.2 (H) 4.8 - 5.6 % Final    Comment:    (NOTE) Pre diabetes:          5.7%-6.4%  Diabetes:              >6.4%  Glycemic control for   <7.0% adults with diabetes   04/25/2021 04:13 AM 9.6 (H) 4.8 - 5.6 % Final    Comment:    (NOTE) Pre diabetes:          5.7%-6.4%  Diabetes:              >6.4%  Glycemic control for   <7.0% adults with diabetes     CBG: Recent Labs  Lab 01/29/22 0715 01/29/22 1103 01/30/22 0540 01/30/22 0748 01/30/22 0846  GLUCAP 129* 122* 192* 170* 115*    Review of Systems:   unable  Past Medical History:  She,  has a past medical history of CHF (congestive heart failure) ([place]), Chronic kidney disease, Diabetes mellitus without complication ([place]), Dyspnea, Hypertension, Neuromuscular disorder ([place]), PAF (paroxysmal atrial fibrillation) ([place])  (08/2020), Pneumonia, and Pulmonary hypertension ([place]).   Surgical History:   Past Surgical History:  Procedure Laterality Date   AV FISTULA PLACEMENT Left 06/18/2021   Procedure: LEFT ARM ARTERIOVENOUS (AV) FISTULA CREATION;  Surgeon: [name], MD;  Location: [place];  Service: Vascular;  Laterality: Left;   AV FISTULA PLACEMENT Right 01/29/2022   Procedure: RIGHT ARM BRACHIOCEPHALIC ARTERIOVENOUS (AV) FISTULA CREATION;  Surgeon: Marty Heck, MD;  Location: [place];  Service: Vascular;  Laterality: Right;   [place] Left 08/28/2021   Procedure: LEFT ARM BASILIC VEIN TRANSPOSITION;  Surgeon: [name], MD;  Location: [place];  Service: Vascular;  Laterality: Left;   CARDIOVERSION N/A 01/14/2021   Procedure: CARDIOVERSION;  Surgeon: Adrian Prows, MD;  Location: [place];  Service: Cardiovascular;  Laterality: N/A;   IR FLUORO GUIDE CV LINE RIGHT  06/15/2021   IR US GUIDE VASC ACCESS RIGHT  06/15/2021   TONSILLECTOMY       Social History:   reports that she has never smoked. She has never been exposed to tobacco smoke. She has never used smokeless tobacco. She reports that she does not drink alcohol and does not use drugs.   Family History:  Her Family history is unknown by patient.   Allergies Allergies  Allergen Reactions   Amlodipine Other (See Comments)    Patient has LE edema to this med at higher doses (10 mg)     Home Medications  Prior to Admission medications   Medication Sig Start Date End Date Taking? Authorizing Provider  HUMULIN 70/30 KWIKPEN (70-30) 100 UNIT/ML KwikPen Inject 5 Units into the skin daily. Patient taking differently: Inject 10 Units into the skin at bedtime. 05/28/21  Yes Regalado, Belkys A, MD  oxyCODONE-acetaminophen (PERCOCET) 5-325 MG tablet Take 1 tablet by mouth every 4 (four) hours as needed for severe pain. 01/29/22 01/29/23 Yes Schuh, McKenzi P, PA-C  Accu-Chek FastClix Lancets MISC See admin  instructions. 01/16/19  [provider]  BD PEN NEEDLE NANO 2ND GEN 32G X 4 MM MISC 3 (three) times daily. as directed 08/13/19   [provider]  Blood Glucose Monitoring Suppl (ACCU-CHEK GUIDE ME) w/Device KIT See admin instructions. 12/11/18   [provider]  diltiazem (CARDIZEM CD) 180 MG 24 hr capsule TAKE 1 CAPSULE(180 MG) BY MOUTH DAILY Patient not taking: Reported on 12/30/2021 07/29/21   Cantwell, Celeste C, PA-C  Hydrocortisone (IVHOYWVXU-27 EX) Apply 1 application. topically 3 (three) times daily as needed (itching).    [provider]  naproxen sodium (ALEVE) 220 MG tablet Take 220 mg by mouth at bedtime as needed (foot pain).    [provider]  pravastatin (PRAVACHOL) 20 MG tablet Take 1 tablet (20 mg total) by mouth daily. Patient not taking: Reported on 12/30/2021 05/12/21   Lawerance Cruel C, PA-C  sevelamer carbonate (RENVELA) 800 MG tablet Take 800 mg by mouth 3 (three) times daily with meals. Patient not taking: Reported on 01/28/2022    [provider]     Critical care time: 79 mins     Kennieth Rad, MSN, AG-ACNP-BC [facility] 01/30/2022, 9:33 AM  See Amion for pager If no response to pager, please call PCCM consult pager After 7:00 pm call [place]

## 2022-01-30 NOTE — Consult Note (Addendum)
Neurology Consultation Reason for Consult: Seizures Referring Physician: [place], a  CC: Seizures  History is obtained from: Daughter  HPI: [name] is a 81 y.o. female with a history of diabetes, paroxysmal A-fib, hypertension who presents with seizures.  There is question of 3-4 seizures previously associated with hypoglycemia, though daughter reports they were in the 84s or 62s, not severely low.  Reviewing chart, there was a least once where the episode was bilateral tremulousness associated with a glucose of 46, so these previous episodes are in question.  At baseline she is oriented x1, needs assistance with multiple aspects of her daily life.  Family was putting her to bed when she became unresponsive and had a fixed gaze.  She then stiffened up and shook for approximately 2 to 3 minutes.  Due to these symptoms, she was transported to the emergency department where she was initially interactive and talkative, but subsequently had a recurrent seizure.  She received 2 mg of IV Ativan and Keppra 1 g.   Past Medical History:  Diagnosis Date   CHF (congestive heart failure) (HCC)    Chronic kidney disease    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   Diabetes mellitus without complication (HCC)    type 2   Dyspnea    with exertion   Hypertension    Neuromuscular disorder (HCC)    neuropathy bil feet   PAF (paroxysmal atrial fibrillation) ([place]) 08/2020   Pneumonia    Pulmonary hypertension ([place])      Family History  Family history unknown: Yes     Social History:  reports that she has never smoked. She has never been exposed to tobacco smoke. She has never used smokeless tobacco. She reports that she does not drink alcohol and does not use drugs.   Exam: Current vital signs: BP 135/72   Pulse 85   Temp 97.9 F (36.6 C) (Oral)   Resp 19   Ht 5\' 4"  (1.626 m)   Wt 69.4 kg   SpO2 99%   BMI 26.26 kg/m  Vital signs in last 24 hours: Temp:  [97.8 F (36.6 C)-98.9 F (37.2 C)]  97.9 F (36.6 C) (09/30 0016) Pulse Rate:  [64-85] 85 (09/30 0015) Resp:  [12-19] 19 (09/30 0015) BP: (93-135)/(53-78) 135/72 (09/30 0015) SpO2:  [97 %-100 %] 99 % (09/30 0015) Weight:  [69.4 kg] 69.4 kg (09/30 0007)   Physical Exam  Constitutional: Elderly  Neuro: Mental Status: Patient is lethargic and difficult to arouse, she does not follow commands. Cranial Nerves: II: Keeps eyes tightly shut, so difficult to check blink to threat. Pupils are equal, round, and reactive to light.   III,IV, VI: Eyes are midline V: Facial sensation is symmetric to temperature VII: Facial movement is symmetric.  Motor: She withdraws to noxious stimulation in all four extremities  sensory: As above  Cerebellar: Does not perform    I have reviewed labs in epic and the results pertinent to this consultation are: Creatinine 2.87  I have reviewed the images obtained: CT head unremarkable  Impression: 81 year old female with unprovoked seizures.  She has a history of "seizures" with previous episodes of hypoglycemia but I wonder if she actually was having unprovoked seizures at that time as well.  She had a significant physiological stress with her procedures today, which could lower seizure threshold to some degree resulting in her presentation today.  Though there is some concern for increased hypoattenuation on CT, this appears chronic to me.  With her history of  dementia, this is likely the etiology, but I would favor getting an MRI in the case, and this can further evaluate the hypoattenuation as well.  Recommendations: 1) EEG 2) MRI brain 3) Keppra 250 mg twice daily 4) neurology will follow   Roland Rack, MD [facility] [phone]  If 7pm- 7am, please page neurology on call as listed in [place].

## 2022-01-30 NOTE — Progress Notes (Cosign Needed Addendum)
EEG complete - results pending 

## 2022-01-30 NOTE — ED Notes (Signed)
Temp 96.8, bair hugger applied

## 2022-01-30 NOTE — ED Notes (Signed)
[place] MD made aware of pts BP 94/59 (71), MD at bedside

## 2022-01-30 NOTE — Consult Note (Signed)
[name] Admit Date: 01/30/2022 01/30/2022 [name] Requesting Physician:  Terry Bosworth MD  Reason for Consult:  ESRD, Hypotensino HPI:  75F ESRD TTS [facility] presented overnight with worsened confusion, concern for seizures.  PMH Incudes: ESRD Status post right BC AVF yesterday 01/29/22 VVS Dr. Carlis Abbott HFpEF DM2 HTN HLD AF Chronic thrombocytopenia  Patient unable to provide much history.  This is gathered from the chart.  Patient underwent what appears to be uneventful AV fistula placement yesterday with vascular surgery.  After going home she had an episode of unresponsiveness with shaking activity concerning for seizure.  The ED it sounds like she had another seizure.  Neurology was consulted.  CT of the head with concern for acute infarct but MRI needed to confirm.  Patient placed on Keppra.  Neurology following.  Patient with persistent hypotension despite fluid bolus x2 prompting CCM evaluation and she has been admitted to the ICU for vasopressors.  Labs notable most recently for K4.8, HCO3 21, BUN 28.  Hemoglobin 9.9.  Patient's last treatment 9/28, leaving 0.5 kg above EDW.  Nearly full in length.  Blood pressures were normal throughout.  At the time of my evaluation the patient is disheveled.  She is oriented to self, location, year.  ROS Balance of 12 systems is negative w/ exceptions as above; very limited because of encephalopathy / poor historian  PMH  Past Medical History:  Diagnosis Date   CHF (congestive heart failure) ([place])    Chronic kidney disease    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   Diabetes mellitus without complication (HCC)    type 2   Dyspnea    with exertion   Hypertension    Neuromuscular disorder (HCC)    neuropathy bil feet   PAF (paroxysmal atrial fibrillation) ([place]) 08/2020   Pneumonia    Pulmonary hypertension ([place])    [place]  Past Surgical History:  Procedure Laterality Date   AV FISTULA PLACEMENT Left 06/18/2021   Procedure: LEFT ARM  ARTERIOVENOUS (AV) FISTULA CREATION;  Surgeon: [name], MD;  Location: [place];  Service: Vascular;  Laterality: Left;   AV FISTULA PLACEMENT Right 01/29/2022   Procedure: RIGHT ARM BRACHIOCEPHALIC ARTERIOVENOUS (AV) FISTULA CREATION;  Surgeon: Marty Heck, MD;  Location: [place];  Service: Vascular;  Laterality: Right;   [place] Left 08/28/2021   Procedure: LEFT ARM BASILIC VEIN TRANSPOSITION;  Surgeon: [name], MD;  Location: [place];  Service: Vascular;  Laterality: Left;   CARDIOVERSION N/A 01/14/2021   Procedure: CARDIOVERSION;  Surgeon: Adrian Prows, MD;  Location: [place];  Service: Cardiovascular;  Laterality: N/A;   IR FLUORO GUIDE CV LINE RIGHT  06/15/2021   IR US GUIDE VASC ACCESS RIGHT  06/15/2021   TONSILLECTOMY     FH  Family History  Family history unknown: Yes   [name]  reports that she has never smoked. She has never been exposed to tobacco smoke. She has never used smokeless tobacco. She reports that she does not drink alcohol and does not use drugs. Allergies  Allergies  Allergen Reactions   Amlodipine Other (See Comments)    Patient has LE edema to this med at higher doses (10 mg)   Home medications Prior to Admission medications   Medication Sig Start Date End Date Taking? Authorizing Provider  HUMULIN 70/30 KWIKPEN (70-30) 100 UNIT/ML KwikPen Inject 5 Units into the skin daily. Patient taking differently: Inject 10 Units into the skin at bedtime. 05/28/21  Yes Regalado, Cassie Freer, MD  oxyCODONE-acetaminophen (PERCOCET) 5-325 MG tablet Take 1 tablet by mouth every 4 (four) hours as needed for severe pain. 01/29/22 01/29/23 Yes Schuh, McKenzi P, PA-C  Accu-Chek FastClix Lancets MISC See admin instructions. 01/16/19   [provider]  BD PEN NEEDLE NANO 2ND GEN 32G X 4 MM MISC 3 (three) times daily. as directed 08/13/19   [provider]  Blood Glucose Monitoring Suppl (ACCU-CHEK GUIDE ME) w/Device KIT  See admin instructions. 12/11/18   [provider]  diltiazem (CARDIZEM CD) 180 MG 24 hr capsule TAKE 1 CAPSULE(180 MG) BY MOUTH DAILY Patient not taking: Reported on 12/30/2021 07/29/21   Cantwell, Celeste C, PA-C  Hydrocortisone ([id number]-72 EX) Apply 1 application. topically 3 (three) times daily as needed (itching).    [provider]  naproxen sodium (ALEVE) 220 MG tablet Take 220 mg by mouth at bedtime as needed (foot pain).    [provider]  pravastatin (PRAVACHOL) 20 MG tablet Take 1 tablet (20 mg total) by mouth daily. Patient not taking: Reported on 12/30/2021 05/12/21   Lawerance Cruel C, PA-C  sevelamer carbonate (RENVELA) 800 MG tablet Take 800 mg by mouth 3 (three) times daily with meals. Patient not taking: Reported on 01/28/2022    [provider]    Current Medications Scheduled Meds:  [START ON 01/31/2022]  stroke: early stages of recovery book   Does not apply Once   insulin aspart  0-6 Units Subcutaneous Q4H   Continuous Infusions:  sodium chloride 250 mL (01/30/22 1250)   [START ON 01/31/2022] cefTRIAXone (ROCEPHIN)  IV     levETIRAcetam Stopped (01/30/22 1057)   norepinephrine (LEVOPHED) Adult infusion 3 mcg/min (01/30/22 1253)   PRN Meds:.acetaminophen **OR** acetaminophen (TYLENOL) oral liquid 160 mg/5 mL **OR** acetaminophen  CBC Recent Labs  Lab 01/29/22 0741 01/30/22 0032 01/30/22 0714  WBC  --  4.5 4.4  NEUTROABS  --  3.3  --   HGB 13.9 11.8* 9.9*  HCT 41.0 37.5 30.4*  MCV  --  94.5 92.4  PLT  --  78* 76*   Basic Metabolic Panel Recent Labs  Lab 01/29/22 0741 01/30/22 0032 01/30/22 0714  NA 139 137 136  K 4.2 5.6* 4.8  CL 97* 99 101  CO2  --  22 21*  GLUCOSE 126* 287* 229*  BUN 17 24* 28*  CREATININE 2.00* 2.87* 3.06*  CALCIUM  --  9.5 8.7*    Physical Exam  Blood pressure 136/73, pulse (!) 47, temperature (!) 97.3 F (36.3 C), temperature source Oral, resp. rate 17, height _0  (1.626 m), weight 69.4  kg, SpO2 96 %. GEN: Chronically ill-appearing, in no immediate distress ENT: NCAT EYES: EOMI CV: Regular, normal S1 and S2, normal rate PULM: Coarse breath sounds bilaterally, normal work of breathing ABD: Soft, nontender SKIN: Incisional site and right AC fossa intact, clean and dry; hyperpigmentation and thickening of the skin in the lower extremities EXT: Trace pretibial edema bilaterally VASC: RUE AVF +B NEURO: AAO to self, location, year  HD Rx: [facility], 4h, THS, TDC, EDW 57.7kg, 400/a1.5, 3K 2.5Ca bath, TDC, C3 0.72mg qTx, no heparin, no current ESA  Assessment 82F ESRD on iHD with AMS, ? Seizure, ? Acute CVA, persistent hypotension.  [place]: to ICU; persisted desite fluid boluses S/p RUE BC AVF 01/29/22 ? Seizures, neuro following ? [facility] MRI when stable AMS, seems more oriented than notes suggest at the time of my evaluation Hx/o  AFib Anemia HFpEF DM2   Plan Though 'due' for HD today, no hard indications.  Will let her declare her hemodynamics further for either CRRT or iHD ? If there is some persistent anesthesia effects from yesterday Will follow closely    [name]  [phone] pgr 01/30/2022, 1:29 PM

## 2022-01-30 NOTE — ED Notes (Addendum)
Pt remains minimally responsive following ativan, unable to follow commands for NIH

## 2022-01-30 NOTE — H&P (Signed)
History and Physical    [name] PPJ:[id number] DOB: 08-26-1940 DOA: 01/30/2022  PCP: Trey Sailors, PA  Patient coming from: Home  Chief Complaint: Unresponsiveness  HPI: [name] is a 81 y.o. female with medical history significant of ESRD on HD TTS, chronic diastolic CHF, insulin-dependent type 2 diabetes, hypertension, hyperlipidemia, paroxysmal A-fib not on anticoagulation, chronic thrombocytopenia, anemia presents to the ED via EMS from home following episode of unresponsiveness.  Family reported that at baseline she is oriented x1.  They were putting her to bed when she became unresponsive and had a fixed gaze.  She then stiffened up and shook for approximately 2 or 3 minutes.  Yesterday she had right upper extremity fistula placed.  Upon EMS arrival, she was awake and alert.  CBG in the 300s.  Vital signs stable on arrival to the ED.  Patient was initially interactive and talkative in the ED but subsequently had a recurrent seizure.  She was given Ativan and IV Keppra 1 g.  Neurology consulted and ordered stat EEG.  Labs showing no leukocytosis, hemoglobin 11.8 with MCV 94.5 (hemoglobin previously ranging between 8.7-14.6 on labs done several months ago), platelet count 78k (was 74k in April 2023), potassium 5.6, bicarb 22, glucose 287, creatinine 2.8, T. bili 1.5 with normal remainder of LFTs, high-sensitivity troponin 22> 24, UA with signs of infection (large amount of leukocytes and microscopy showing > 50 WBCs and many bacteria).  Chest x-ray negative for acute finding.  CT head showing increased hypoattenuation within the right frontal and right parietal lobes concerning for acute infarcts with watershed distribution. Patient had abdominal distention and bladder scan revealed 540 mL, Foley catheter placed.  Other medications administered in the ED include IV Lasix 40 mg, Zofran, and ceftriaxone.  Patient is currently very somnolent and not able to give any history.  Review  of Systems:  Review of Systems  All other systems reviewed and are negative.   Past Medical History:  Diagnosis Date   CHF (congestive heart failure) (HCC)    Chronic kidney disease    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   Diabetes mellitus without complication (HCC)    type 2   Dyspnea    with exertion   Hypertension    Neuromuscular disorder (HCC)    neuropathy bil feet   PAF (paroxysmal atrial fibrillation) ([place]) 08/2020   Pneumonia    Pulmonary hypertension ([place])     Past Surgical History:  Procedure Laterality Date   AV FISTULA PLACEMENT Left 06/18/2021   Procedure: LEFT ARM ARTERIOVENOUS (AV) FISTULA CREATION;  Surgeon: [name], MD;  Location: [place];  Service: Vascular;  Laterality: Left;   [place] Left 08/28/2021   Procedure: LEFT ARM BASILIC VEIN TRANSPOSITION;  Surgeon: [name], MD;  Location: [place];  Service: Vascular;  Laterality: Left;   CARDIOVERSION N/A 01/14/2021   Procedure: CARDIOVERSION;  Surgeon: Adrian Prows, MD;  Location: [place];  Service: Cardiovascular;  Laterality: N/A;   IR FLUORO GUIDE CV LINE RIGHT  06/15/2021   IR US GUIDE VASC ACCESS RIGHT  06/15/2021   TONSILLECTOMY       reports that she has never smoked. She has never been exposed to tobacco smoke. She has never used smokeless tobacco. She reports that she does not drink alcohol and does not use drugs.  Allergies  Allergen Reactions   Amlodipine Other (See Comments)    Patient has LE edema to this med at higher doses (10 mg)  Family History  Family history unknown: Yes    Prior to Admission medications   Medication Sig Start Date End Date Taking? Authorizing Provider  HUMULIN 70/30 KWIKPEN (70-30) 100 UNIT/ML KwikPen Inject 5 Units into the skin daily. Patient taking differently: Inject 10 Units into the skin at bedtime. 05/28/21  Yes Regalado, Cassie Freer, MD  Accu-Chek FastClix Lancets MISC See admin instructions. 01/16/19   [provider]  BD PEN NEEDLE NANO 2ND GEN 32G X 4 MM MISC 3 (three) times daily. as directed 08/13/19   [provider]  Blood Glucose Monitoring Suppl (ACCU-CHEK GUIDE ME) w/Device KIT See admin instructions. 12/11/18   [provider]  diltiazem (CARDIZEM CD) 180 MG 24 hr capsule TAKE 1 CAPSULE(180 MG) BY MOUTH DAILY Patient not taking: Reported on 12/30/2021 07/29/21   [place], Anderson Malta C, PA-C  HYDROcodone-acetaminophen (NORCO/VICODIN) 5-325 MG tablet Take 1 tablet by mouth every 6 (six) hours as needed for moderate pain. Patient not taking: Reported on 12/30/2021 08/28/21   Ulyses Amor, PA-C  Hydrocortisone (ZOXWRUEAV-40 EX) Apply 1 application. topically 3 (three) times daily as needed (itching).    [provider]  naproxen sodium (ALEVE) 220 MG tablet Take 220 mg by mouth at bedtime as needed (foot pain).    [provider]  oxyCODONE-acetaminophen (PERCOCET) 5-325 MG tablet Take 1 tablet by mouth every 4 (four) hours as needed for severe pain. 01/29/22 01/29/23  Schuh, McKenzi P, PA-C  pravastatin (PRAVACHOL) 20 MG tablet Take 1 tablet (20 mg total) by mouth daily. Patient not taking: Reported on 12/30/2021 05/12/21   Lawerance Cruel C, PA-C  sevelamer carbonate (RENVELA) 800 MG tablet Take 800 mg by mouth 3 (three) times daily with meals. Patient not taking: Reported on 01/28/2022    [provider]    Physical Exam: Vitals:   01/30/22 0015 01/30/22 0016 01/30/22 0224 01/30/22 0225  BP: 135/72  106/72 106/76  Pulse: 85  81 75  Resp: _0 Temp:  97.9 F (36.6 C)    TempSrc:  Oral    SpO2: 99%  99% 99%  Weight:      Height:        Physical Exam Vitals reviewed.  Constitutional:      General: She is not in acute distress. HENT:     Head: Normocephalic and atraumatic.  Eyes:     Pupils: Pupils are equal, round, and reactive to light.  Cardiovascular:     Rate and Rhythm: Normal rate and regular rhythm.     Pulses: Normal  pulses.  Pulmonary:     Effort: Pulmonary effort is normal. No respiratory distress.     Breath sounds: No wheezing.  Abdominal:     General: Bowel sounds are normal. There is no distension.     Palpations: Abdomen is soft.     Tenderness: There is no abdominal tenderness.  Musculoskeletal:     Cervical back: Normal range of motion.     Right lower leg: No edema.     Left lower leg: No edema.  Skin:    General: Skin is warm and dry.  Neurological:     Comments: Very somnolent, not following commands Moving bilateral lower extremities spontaneously     Labs on Admission: I have personally reviewed following labs and imaging studies  CBC: Recent Labs  Lab 01/29/22 0741 01/30/22 0032  WBC  --  4.5  NEUTROABS  --  3.3  HGB 13.9 11.8*  HCT 41.0  37.5  MCV  --  94.5  PLT  --  78*   Basic Metabolic Panel: Recent Labs  Lab 01/29/22 0741 01/30/22 0032  NA 139 137  K 4.2 5.6*  CL 97* 99  CO2  --  22  GLUCOSE 126* 287*  BUN 17 24*  CREATININE 2.00* 2.87*  CALCIUM  --  9.5   GFR: Estimated Creatinine Clearance: 15 mL/min (A) (by C-G formula based on SCr of 2.87 mg/dL (H)). Liver Function Tests: Recent Labs  Lab 01/30/22 0032  AST 23  ALT 10  ALKPHOS 94  BILITOT 1.5*  PROT 7.5  ALBUMIN 3.6   No results for input(s): "LIPASE", "AMYLASE" in the last 168 hours. No results for input(s): "AMMONIA" in the last 168 hours. Coagulation Profile: No results for input(s): "INR", "PROTIME" in the last 168 hours. Cardiac Enzymes: No results for input(s): "CKTOTAL", "CKMB", "CKMBINDEX", "TROPONINI" in the last 168 hours. BNP (last 3 results) Recent Labs    05/12/21 1311 05/20/21 1159  PROBNP 6,865* 10,030*   HbA1C: No results for input(s): "HGBA1C" in the last 72 hours. CBG: Recent Labs  Lab 01/29/22 0715 01/29/22 1103  GLUCAP 129* 122*   Lipid Profile: No results for input(s): "CHOL", "HDL", "LDLCALC", "TRIG", "CHOLHDL", "LDLDIRECT" in the last 72  hours. Thyroid Function Tests: No results for input(s): "TSH", "T4TOTAL", "FREET4", "T3FREE", "THYROIDAB" in the last 72 hours. Anemia Panel: No results for input(s): "VITAMINB12", "FOLATE", "FERRITIN", "TIBC", "IRON", "RETICCTPCT" in the last 72 hours. Urine analysis:    Component Value Date/Time   COLORURINE AMBER (A) 01/30/2022 0233   APPEARANCEUR CLOUDY (A) 01/30/2022 0233   LABSPEC 1.016 01/30/2022 0233   PHURINE 5.0 01/30/2022 0233   GLUCOSEU NEGATIVE 01/30/2022 0233   HGBUR SMALL (A) 01/30/2022 0233   BILIRUBINUR NEGATIVE 01/30/2022 0233   KETONESUR NEGATIVE 01/30/2022 0233   PROTEINUR 100 (A) 01/30/2022 0233   NITRITE NEGATIVE 01/30/2022 0233   LEUKOCYTESUR LARGE (A) 01/30/2022 0233    Radiological Exams on Admission: CT Head Wo Contrast  Result Date: 01/30/2022 CLINICAL DATA:  Patient found unresponsive EXAM: CT HEAD WITHOUT CONTRAST TECHNIQUE: Contiguous axial images were obtained from the base of the skull through the vertex without intravenous contrast. RADIATION DOSE REDUCTION: This exam was performed according to the departmental dose-optimization program which includes automated exposure control, adjustment of the mA and/or kV according to patient size and/or use of iterative reconstruction technique. COMPARISON:  None Available. FINDINGS: Brain: Ill-defined hypoattenuation within the cerebral white matter is nonspecific but consistent with chronic small vessel ischemic disease. This is asymmetrically increased within the right frontal lobe as well as within the right parietal lobe. No intracranial hemorrhage or mass effect. No hydrocephalus. No extra-axial fluid collection. Generalized cerebral atrophy. Vascular: No hyperdense vessel. Intracranial arterial calcification. Skull: No fracture or focal lesion. Sinuses/Orbits: No acute finding. Paranasal sinuses and mastoid air cells are well aerated. Other: None. IMPRESSION: Increased hypoattenuation within the right frontal and  right parietal lobes concerning for acute infarcts with watershed distribution. MRI is recommended for further evaluation. Critical Value/emergent results were called by telephone at the time of interpretation on 01/30/2022 at 1:02 am to provider [facility] , who verbally acknowledged these results. Electronically Signed   By: Placido Sou M.D.   On: 01/30/2022 01:04   DG Chest Portable 1 View  Result Date: 01/30/2022 CLINICAL DATA:  Altered mental status EXAM: PORTABLE CHEST 1 VIEW COMPARISON:  Radiographs 08/04/2021 FINDINGS: Stable cardiomegaly. Right IJ CVC tip at the superior cavoatrial junction.  Aortic atherosclerotic calcification. No focal consolidation, pleural effusion, or pneumothorax. No acute osseous abnormality. IMPRESSION: Unchanged cardiomegaly.  No acute abnormality. Electronically Signed   By: Placido Sou M.D.   On: 01/30/2022 00:30    EKG: Independently reviewed.  Rate controlled A-fib, T wave abnormality in inferolateral leads.  Assessment and Plan  Acute CVA CT head showing increased hypoattenuation within the right frontal and right parietal lobes concerning for acute infarcts with watershed distribution.  -Neurology consulted -Telemetry monitoring -MRI of the brain without contrast -Echocardiogram -Hemoglobin A1c, fasting lipid panel -Antiplatelet therapy per neurology recommendations -Frequent neurochecks -PT, OT, speech therapy. -N.p.o. until cleared by bedside swallow evaluation or formal speech evaluation   Seizures -Neurology consulted -Stat EEG done, results pending -Patient received Keppra 1 g in the ED, further dosing per neurology recommendations -Seizure precautions  UTI UA with large amount of leukocytes and microscopy showing >50 WBCs and many bacteria.  No fever, leukocytosis, or signs of sepsis. -Ceftriaxone -Add on urine culture  Acute urinary retention -Continue Foley catheter  ESRD on HD TTS Mild hyperkalemia -Cardiac  monitoring -Patient received IV Lasix in the ED.  Give insulin and repeat labs to check potassium. Patient currently very somnolent and not able to take [facility]. -She is due for dialysis today, consult nephrology in a.m.  Chronic diastolic CHF No significant volume overload on exam.  No pulmonary edema on chest x-ray. -Patient received IV Lasix 40 mg in the ED.  Subsequently became hypotensive with systolic dropping to the 00B.  250 cc fluid bolus ordered and monitor blood pressure closely. -Check BNP -Volume management with dialysis, consult nephrology in a.m.  Poorly controlled insulin-dependent type 2 diabetes A1c 9.6 in December 2022.  CBG currently in the 190s. -Repeat A1c -Very sensitive sliding scale insulin ACHS  Paroxysmal A-fib  Currently rate controlled.  Not on anticoagulation in the setting of chronic thrombocytopenia. -Hold Cardizem at this time given hypotension and acute stroke  Chronic normocytic anemia Likely due to ESRD.  Hemoglobin 11.8, no signs of active bleeding. -Continue to monitor  Chronic thrombocytopenia No signs of active bleeding. -Continue to monitor -Avoid anticoagulation for DVT prophylaxis  Mild troponin elevation Likely due to demand ischemia in the setting of ESRD/volume overload.  Troponin mildly elevated but flat and not consistent with ACS.  DVT prophylaxis: SCDs Code Status: Full Code Family Communication: No family available at this time. Consults called: Neurology Level of care: Progressive Care Unit Admission status: It is my clinical opinion that admission to INPATIENT is reasonable and necessary because of the expectation that this patient will require hospital care that crosses at least 2 midnights to treat this condition based on the medical complexity of the problems presented.  Given the aforementioned information, the predictability of an adverse outcome is felt to be significant.   Shela Leff MD [facility]  If  7PM-7AM, please contact night-coverage [email or web address]  01/30/2022, 3:21 AM

## 2022-01-30 NOTE — ED Notes (Signed)
This RN noted fully body tremors and L side gaze. Palumbo MD called to bedside, 2mg  ativan given. Total seizure time 4 minutes, postical for 1 minute

## 2022-01-30 NOTE — ED Notes (Signed)
Admitting at bedside 

## 2022-01-30 NOTE — ED Notes (Signed)
Pt is unable to hold the cup to drink the water. Multiple attempts were made but were unsuccessful.

## 2022-01-30 NOTE — Progress Notes (Signed)
An USGPIV (ultrasound guided PIV) has been placed for short-term vasopressor infusion. A correctly placed ivWatch must be used when administering Vasopressors. Should this treatment be needed beyond 72 hours, central line access should be obtained.  It will be the responsibility of the bedside nurse to follow best practice to prevent extravasations.   ?

## 2022-01-30 NOTE — ED Notes (Signed)
Pt repositioned in bed, fluid bolus complete, Bair hugger temperature decreased.

## 2022-01-31 ENCOUNTER — Inpatient Hospital Stay (HOSPITAL_COMMUNITY): Payer: Medicare HMO

## 2022-01-31 DIAGNOSIS — R569 Unspecified convulsions: Secondary | ICD-10-CM | POA: Diagnosis not present

## 2022-01-31 DIAGNOSIS — I639 Cerebral infarction, unspecified: Secondary | ICD-10-CM | POA: Diagnosis not present

## 2022-01-31 LAB — LIPID PANEL
Cholesterol: 117 mg/dL (ref 0–200)
HDL: 38 mg/dL — ABNORMAL LOW (ref >40)
LDL Cholesterol: 63 mg/dL (ref 0–99)
Total CHOL/HDL Ratio: 3.1 RATIO
Triglycerides: 82 mg/dL (ref <150)
VLDL: 16 mg/dL (ref 0–40)

## 2022-01-31 LAB — RENAL FUNCTION PANEL
Albumin: 3 g/dL — ABNORMAL LOW (ref 3.5–5.0)
Anion gap: 14 (ref 5–15)
BUN: 35 mg/dL — ABNORMAL HIGH (ref 8–23)
CO2: 21 mmol/L — ABNORMAL LOW (ref 22–32)
Calcium: 8.5 mg/dL — ABNORMAL LOW (ref 8.9–10.3)
Chloride: 103 mmol/L (ref 98–111)
Creatinine, Ser: 3.61 mg/dL — ABNORMAL HIGH (ref 0.44–1.00)
GFR, Estimated: 12 mL/min — ABNORMAL LOW (ref >60)
Glucose, Bld: 132 mg/dL — ABNORMAL HIGH (ref 70–99)
Phosphorus: 4.7 mg/dL — ABNORMAL HIGH (ref 2.5–4.6)
Potassium: 4.5 mmol/L (ref 3.5–5.1)
Sodium: 138 mmol/L (ref 135–145)

## 2022-01-31 LAB — ECHOCARDIOGRAM COMPLETE
AR max vel: 1.34 cm2
AV Area VTI: 1.36 cm2
AV Area mean vel: 1.31 cm2
AV Mean grad: 3 mmHg
AV Peak grad: 4.9 mmHg
Ao pk vel: 1.11 m/s
Height: 64 in
S' Lateral: 2.2 cm
Single Plane A4C EF: 57.7 %
Weight: 2123.47 oz

## 2022-01-31 LAB — HEPATITIS C ANTIBODY: HCV Ab: NONREACTIVE

## 2022-01-31 LAB — HEPATITIS B CORE ANTIBODY, TOTAL: Hep B Core Total Ab: NONREACTIVE

## 2022-01-31 LAB — GLUCOSE, CAPILLARY
Glucose-Capillary: 86 mg/dL (ref 70–99)
Glucose-Capillary: 77 mg/dL (ref 70–99)
Glucose-Capillary: 126 mg/dL — ABNORMAL HIGH (ref 70–99)
Glucose-Capillary: 126 mg/dL — ABNORMAL HIGH (ref 70–99)
Glucose-Capillary: 109 mg/dL — ABNORMAL HIGH (ref 70–99)
Glucose-Capillary: 164 mg/dL — ABNORMAL HIGH (ref 70–99)

## 2022-01-31 LAB — HEPATITIS B SURFACE ANTIBODY,QUALITATIVE: Hep B S Ab: NONREACTIVE

## 2022-01-31 LAB — HEPATITIS B SURFACE ANTIGEN: Hepatitis B Surface Ag: NONREACTIVE

## 2022-01-31 MED ORDER — LORAZEPAM 2 MG/ML IJ SOLN
1.0000 mg | INTRAMUSCULAR | Status: AC | PRN
  Administered 2022-01-31 (×2): 1 mg via INTRAVENOUS
  Filled 2022-01-31: qty 1

## 2022-01-31 NOTE — Evaluation (Signed)
Occupational Therapy Evaluation Patient Details Name: [name] MRN: [id number] DOB: January 02, 1941 Today's Date: 01/31/2022   History of Present Illness 81 year old female who presented with seizures; family was helping her to bed and she became unresponsive with a fixed gaze, stiffened, and shook for 2 to 3 minutes. Work up pending. Pt with history of DM, PAF, HTN, ESRD on HD   Clinical Impression   [name] was evaluated s/p the above admission list, PLOF and home set up unknown. Upon arrival pt lethargic, not following commands. Transferred pt to EOB with total A+2, no increase in LOA in sitting, extensor patterns noted. Overall she requires total A +2 fro all aspects of her care. OT to continue to follow in anticipation of increased arousal and participation. Recommend SNF at d/c.      Recommendations for follow up therapy are one component of a multi-disciplinary discharge planning process, led by the attending physician.  Recommendations may be updated based on patient status, additional functional criteria and insurance authorization.   Follow Up Recommendations  Skilled nursing-short term rehab (<3 hours/day)    Assistance Recommended at Discharge Frequent or constant Supervision/Assistance  Patient can return home with the following A lot of help with bathing/dressing/bathroom;A lot of help with walking and/or transfers;Assistance with cooking/housework;Assistance with feeding;Direct supervision/assist for medications management;Direct supervision/assist for financial management;Assist for transportation;Help with stairs or ramp for entrance    Functional Status Assessment  Patient has had a recent decline in their functional status and demonstrates the ability to make significant improvements in function in a reasonable and predictable amount of time.  Equipment Recommendations  None recommended by OT       Precautions / Restrictions Precautions Precautions:  Fall Restrictions Weight Bearing Restrictions: No      Mobility Bed Mobility Overal bed mobility: Needs Assistance Bed Mobility: Rolling, Supine to Sit, Sit to Supine Rolling: Total assist, +2 for physical assistance, +2 for safety/equipment   Supine to sit: Total assist, +2 for physical assistance, +2 for safety/equipment Sit to supine: Total assist, +2 for physical assistance, +2 for safety/equipment        Transfers                   General transfer comment: deferred due to LOA      Balance Overall balance assessment: Needs assistance   Sitting balance-Leahy Scale: Zero                   ADL either performed or assessed with clinical judgement   ADL Overall ADL's : Needs assistance/impaired                   General ADL Comments: total A at bed level     Vision Baseline Vision/History: 0 No visual deficits Vision Assessment?: Vision impaired- to be further tested in functional context Additional Comments: needs to be assessed. pt;s eyes closed this session            Pertinent Vitals/Pain Pain Assessment Pain Assessment: Faces Faces Pain Scale: Hurts a little bit Pain Location: mild grimace with cervical ROM Pain Descriptors / Indicators: Discomfort Pain Intervention(s): Limited activity within patient's tolerance, Monitored during session     Hand Dominance Right   Extremity/Trunk Assessment Upper Extremity Assessment Upper Extremity Assessment: RUE deficits/detail;LUE deficits/detail RUE Deficits / Details: rigid, difficult to move through PROM LUE Deficits / Details: rigid, difficult to move through PROM   Lower Extremity Assessment Lower Extremity Assessment: Defer to PT evaluation   Cervical /  Trunk Assessment Cervical / Trunk Assessment: Kyphotic   Communication Communication Communication: Expressive difficulties;Receptive difficulties (no attempt at verbalizations, no following commands)   Cognition  Arousal/Alertness: Lethargic Behavior During Therapy: Flat affect Overall Cognitive Status: Difficult to assess         General Comments: no command follow, no attempt to open eyes.     General Comments  VSS on RA     Home Living Family/patient expects to be discharged to:: Private residence Living Arrangements: Children Available Help at Discharge: Family;Available 24 hours/day Type of Home: House Home Access: Stairs to enter [employer] of Steps: 3 Entrance Stairs-Rails: Can reach both Home Layout: Able to live on main level with bedroom/bathroom;Two level     Bathroom Shower/Tub: Occupational psychologist: Standard     Home Equipment: Rollator (4 wheels);Shower seat;Hospital bed   Additional Comments: Set up taken from admission 06/2021      Prior Functioning/Environment Prior Level of Function : Patient poor historian/Family not available             Mobility Comments: in 02/23 pt was amulatory with RW. Unsure of current mobility status ADLs Comments: in 06/2021. was was indep wtih ADLs        OT Problem List: Decreased strength;Decreased range of motion;Decreased activity tolerance;Impaired balance (sitting and/or standing);Decreased cognition;Decreased safety awareness;Decreased knowledge of use of DME or AE      OT Treatment/Interventions: Self-care/ADL training;Therapeutic exercise;DME and/or AE instruction;Therapeutic activities;Patient/family education;Balance training    OT Goals(Current goals can be found in the care plan section) Acute Rehab OT Goals OT Goal Formulation: With patient Time For Goal Achievement: 02/14/22 Potential to Achieve Goals: Good ADL Goals Pt Will Perform Grooming: with min assist;sitting Pt Will Perform Upper Body Dressing: with min assist;sitting Pt Will Perform Lower Body Dressing: with mod assist;sit to/from stand Pt Will Transfer to Toilet: with mod assist;stand pivot transfer;bedside  commode Additional ADL Goal #1: Pt will complete bed mobility wtih min A as a precursor to ADLs  OT Frequency: Min 2X/week    Co-evaluation PT/OT/SLP Co-Evaluation/Treatment: Yes Reason for Co-Treatment: Complexity of the patient's impairments (multi-system involvement);For patient/therapist safety;To address functional/ADL transfers   OT goals addressed during session: ADL's and self-care      AM-PAC OT "6 Clicks" Daily Activity     Outcome Measure Help from another person eating meals?: Total Help from another person taking care of personal grooming?: Total Help from another person toileting, which includes using toliet, bedpan, or urinal?: Total Help from another person bathing (including washing, rinsing, drying)?: Total Help from another person to put on and taking off regular upper body clothing?: Total Help from another person to put on and taking off regular lower body clothing?: Total 6 Click Score: 6   End of Session Nurse Communication: Mobility status  Activity Tolerance: Patient tolerated treatment well Patient left: in bed;with call bell/phone within reach;with bed alarm set  OT Visit Diagnosis: Unsteadiness on feet (R26.81);Other abnormalities of gait and mobility (R26.89);Muscle weakness (generalized) (M62.81)                Time: 1610-9604 OT Time Calculation (min): 20 min Charges:  OT General Charges $OT Visit: 1 Visit OT Evaluation $OT Eval Moderate Complexity: 1 Mod   Shandrea Lusk D Causey 01/31/2022, 2:36 PM

## 2022-01-31 NOTE — Procedures (Signed)
Routine EEG Report  [name] is a 81 y.o. female with a history of seizures who is undergoing an EEG to evaluate for seizures.  Report: This EEG was acquired with electrodes placed according to the International 10-20 electrode system (including Fp1, Fp2, F3, F4, C3, C4, P3, P4, O1, O2, T3, T4, T5, T6, A1, A2, Fz, Cz, Pz). The following electrodes were missing or displaced: none.  There was no clear waking rhythm. Best background was composed of low-amplitude slowing at 3-5 Hz. This activity is reactive to stimulation. Sleep was identified by K complexes and sleep spindles. There was no focal slowing. There were no interictal epileptiform discharges. There were no electrographic seizures identified. Photic stimulation and hyperventilation were not performed.  Impression and clinical correlation: This EEG was obtained while asleep and is abnormal due to moderate-to-severe diffuse slowing indicative of global cerebral dysfunction. Epileptiform abnormalities were not seen during this recording.  Su Monks, MD [facility] [phone]  If 7pm- 7am, please page neurology on call as listed in [place].

## 2022-01-31 NOTE — Progress Notes (Signed)
[place] Progress Note [name] DOB: [birth year] MRN: [id number]   Date of Service  01/31/2022  HPI/Events of Note  Pt anxious, restless. Cannot tolerate MRI  eICU Interventions  Low dose PRN ativan ordered to help pt tolerate imaging.  Will continue to monitor closely.         [place] 01/31/2022, 1:45 AM

## 2022-01-31 NOTE — Consult Note (Signed)
NAME:  [name], MRN:  [id number], DOB:  1941-02-28, LOS: 1 ADMISSION DATE:  01/30/2022, CONSULTATION DATE:  01/30/22 REFERRING MD:  Dr. Doristine Bosworth, CHIEF COMPLAINT:  AMS   History of Present Illness:  HPI obtained from medical chart review as patient is unable to provide hx, no family in room.    81 year old female who presented from home on 9/29 with altered mental status.  Family reported patient became unresponsive as they were putting her to bed with a fixed gaze followed by stiffening and shaking for 2-3 mins.  At baseline, she is oriented x 1 and has questionable history of seizures prior to thought associated with hypoglycemia previously.  By EMS arrival, she was more alert with CBG in 300's.    In ER, she was afebrile, normotensive, and with normal saturations.  She was more responsive, interactive and talking but then had recurrent seizure, receiving ativan and Keppra with Neurology consulting.  CTH with concern of hypoattenuation in the right frontal and right parietal lobes concerning for acute infarcts with watershed distribution.  Other workup noted for UA with large leuks, many bacteria, WBC 50 (not new for patient and reportedly asymptomatic) started on ceftriaxone with UC sent, foley placed for urinary retention, and was given lasix in ER.  Labs with K 5.6, normal LFTs except t.bili 1.5, trop hs 22- 24, and BNP 1432, Hgb 11.8, plts 78 (at baseline), CXR non acute.  Since lasix, her blood pressure has continued to decline with mild and brief improvement after NS 250 ml bolus.  Her mental status mostly somnolent in ER, with no further clinical seizures reported.   PCCM consulted for ongoing hypotension.    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 rate 12, height 5\' 4"  (1.626 m), weight 60.2 kg, SpO2 (!) 85 %.        Intake/Output Summary (Last 24 hours) at 01/31/2022 0733 Last data filed at 01/31/2022 0700 Gross per 24 hour  Intake 1471.53 ml  Output 275 ml  Net 1196.53 ml   Filed Weights   01/30/22 0007 01/30/22 1400  Weight: 69.4 kg 60.2 kg   Examination: General:  `   Chronically ill woman laying in bed in no distress HEENT: Oropharynx dry, pupils equal Neuro: She is awake, oriented to self, situation.  Moves all extremities although globally weak.  Answers questions appropriately CV: Irregularly irregular, 2/6 systolic murmur PULM: Clear bilaterally, no wheezes or crackles GI: Nondistended, positive bowel sounds Extremities: No edema, chronic venous stasis changes Skin: no rashes    Resolved Hospital Problem list    Assessment & Plan:   Hypotension> shock, undifferentiated at this time.  Resolved -Careful IV fluids and volume removal when she has HD, norepinephrine weaned off -No clear evidence for infection, will follow culture data, plan to quickly stop ceftriaxone if remains negative -Await echocardiogram final read  Encephalopathy concerning for seizures and with apparent acute  CVA ? Hx of dementia> reported baseline is oriented x1 - ?hx of seizures in past with hypoglycemia.  She has been hyperglycemic here -Appreciate neurology assistance. -She was unable to get her MRI overnight due to agitation.  We will try to reschedule, use light sedation as needed in order to accomplish -Follow EEG results -SLP/swallow screen, n.p.o. until cleared -Avoid hypoglycemia given prior  history of possible associated seizures   ESRD on iHD, TTS Urinary retention Hyperkalemia- mild - has R TDC, R AVF placed 9/29 by [facility] -Appreciate nephrology evaluation.  Hemodynamically improved, should be able to do IHD when necessary -Follow BMP closely -Foley catheter in place  HFpEF Suspected PAH - prior TTE 08/2020 with EF 55-60, indeterminate DD, RVSF mod reduced with PASP 68, mild to mod MR, severe TR - LFTs ok, trop flat 22-24, BNP 1432 which could be related to her chronic RV dysfunction/ PH -Follow echo results -Careful with IV fluid resuscitation   Afib  -Restart home diltiazem when blood pressure will allow -Telemetry monitoring -Optimize electrolytes   IDDM, poorly controlled - A1c 8.2 - CBG q 4/very sensitive SSI -Avoid hypoglycemia  Chronic normocytic anemia Chronic thrombocytopenia -Follow intermittent CBC  Disposition -Should be able to go out of the ICU after her MRI  Best Practice (right click and "Reselect all SmartList Selections" daily)   Diet/type: NPO DVT prophylaxis: SCD GI prophylaxis: PPI Lines: N/A Foley:  Yes, and it is still needed Code Status:  full code Last date of multidisciplinary goals of care discussion [pending]  Attempted to reach [name] (daughter) by phone [phone], unable to reach at this time for update/ confirm [place].  Will attempt again soon.   Labs   CBC: Recent Labs  Lab 01/29/22 0741 01/30/22 0032 01/30/22 0714  WBC  --  4.5 4.4  NEUTROABS  --  3.3  --   HGB 13.9 11.8* 9.9*  HCT 41.0 37.5 30.4*  MCV  --  94.5 92.4  PLT  --  78* 76*    Basic Metabolic Panel: Recent Labs  Lab 01/29/22 0741 01/30/22 0032 01/30/22 0714  NA 139 137 136  K 4.2 5.6* 4.8  CL 97* 99 101  CO2  --  22 21*  GLUCOSE 126* 287* 229*  BUN 17 24* 28*  CREATININE 2.00* 2.87* 3.06*  CALCIUM  --  9.5 8.7*   GFR: Estimated Creatinine Clearance: 12.7 mL/min (A) (by C-G formula based on SCr of 3.06 mg/dL (H)). Recent Labs  Lab  01/30/22 0032 01/30/22 0714 01/30/22 1037 01/30/22 1502  WBC 4.5 4.4  --   --   LATICACIDVEN  --   --  3.0* 2.4*    Liver Function Tests: Recent Labs  Lab 01/30/22 0032  AST 23  ALT 10  ALKPHOS 94  BILITOT 1.5*  PROT 7.5  ALBUMIN 3.6   No results for input(s): "LIPASE", "AMYLASE" in the last 168 hours. No results for input(s): "AMMONIA" in the last 168 hours.  ABG    Component Value Date/Time   TCO2 29 01/29/2022 0741     Coagulation Profile: No results for input(s): "INR", "PROTIME" in the last 168 hours.  Cardiac Enzymes: No results for input(s): "CKTOTAL", "CKMB", "CKMBINDEX", "TROPONINI" in the last 168 hours.  HbA1C: Hgb A1c MFr Bld  Date/Time Value Ref Range Status  01/30/2022 07:14 AM 8.2 (H) 4.8 - 5.6 % Final    Comment:    (NOTE) Pre diabetes:          5.7%-6.4%  Diabetes:              >  6.4%  Glycemic control for   <7.0% adults with diabetes   04/25/2021 04:13 AM 9.6 (H) 4.8 - 5.6 % Final    Comment:    (NOTE) Pre diabetes:          5.7%-6.4%  Diabetes:              >6.4%  Glycemic control for   <7.0% adults with diabetes     CBG: Recent Labs  Lab 01/30/22 1540 01/30/22 2007 01/30/22 2356 01/31/22 0538 01/31/22 0732  GLUCAP 83 76 98 86 77     Critical care time: NA    Baltazar Apo, MD, PhD 01/31/2022, 7:33 AM [facility] [phone] or if no answer before 7:00PM call [phone] For any issues after 7:00PM please call eLink [phone]

## 2022-01-31 NOTE — Progress Notes (Signed)
Admit: 01/30/2022 LOS: 1  74F ESRD on iHD with AMS, ? Seizure, ? Acute CVA, persistent hypotension.  Subjective:  No interval events AAO to self, location, year; not day of week, thinks Aneta Mins is president Off pressors  On RA, VSS  09/30 0701 - 10/01 0700 In: 1721.5 [I.V.:197.9; IV Piggyback:1523.6] Out: 275 [Urine:275]  Filed Weights   01/30/22 0007 01/30/22 1400  Weight: 69.4 kg 60.2 kg    Scheduled Meds:   stroke: early stages of recovery book   Does not apply Once   Chlorhexidine Gluconate Cloth  6 each Topical Q0600   insulin aspart  0-6 Units Subcutaneous Q4H   Continuous Infusions:  sodium chloride 10 mL/hr at 01/31/22 0800   cefTRIAXone (ROCEPHIN)  IV Stopped (01/31/22 0405)   levETIRAcetam Stopped (01/30/22 2229)   norepinephrine (LEVOPHED) Adult infusion Stopped (01/31/22 0532)   PRN Meds:.acetaminophen **OR** acetaminophen (TYLENOL) oral liquid 160 mg/5 mL **OR** acetaminophen, LORazepam  Current Labs: reviewed   Physical Exam:  Blood pressure 104/64, pulse 86, temperature (!) 97.5 F (36.4 C), temperature source Oral, resp. rate 15, height 5\' 4"  (1.626 m), weight 60.2 kg, SpO2 94 %. GEN: Chronically ill-appearing, in no immediate distress ENT: NCAT EYES: EOMI CV: Regular, normal S1 and S2, normal rate PULM: Coarse breath sounds bilaterally, normal work of breathing ABD: Soft, nontender SKIN: Incisional site and right AC fossa intact, clean and dry; hyperpigmentation and thickening of the skin in the lower extremities EXT: Trace pretibial edema bilaterally VASC: RUE AVF +B NEURO: AAO to self, location, year  HD Rx: [facility], 4h, THS, TDC, EDW 57.7kg, 400/a1.5, 3K 2.5Ca bath, TDC, C3 0.57mcg qTx, no heparin, no current ESA  A ESRD THS at [facility] / hypotension: to ICU; persisted desite fluid boluses S/p RUE BC AVF 01/29/22 ? Seizures, neuro following ? Acute ischemic CVA on [facility] 9/29, MRI when stable AMS, seems more oriented than notes suggest at the time  of my evaluation Hx/o AFib Anemia HFpEF DM2  P No strong indicatinos for immediate HD Plan for later today/tonight based on staffing Medication Issues; Preferred narcotic agents for pain control are hydromorphone, fentanyl, and methadone. Morphine should not be used.  Baclofen should be avoided Avoid oral sodium phosphate and magnesium citrate based laxatives / bowel preps    Pearson Grippe MD 01/31/2022, 9:38 AM  Recent Labs  Lab 01/29/22 0741 01/30/22 0032 01/30/22 0714  NA 139 137 136  K 4.2 5.6* 4.8  CL 97* 99 101  CO2  --  22 21*  GLUCOSE 126* 287* 229*  BUN 17 24* 28*  CREATININE 2.00* 2.87* 3.06*  CALCIUM  --  9.5 8.7*   Recent Labs  Lab 01/29/22 0741 01/30/22 0032 01/30/22 0714  WBC  --  4.5 4.4  NEUTROABS  --  3.3  --   HGB 13.9 11.8* 9.9*  HCT 41.0 37.5 30.4*  MCV  --  94.5 92.4  PLT  --  78* 76*

## 2022-01-31 NOTE — Progress Notes (Signed)
This is the second attempt at scanning [name]. The RN did give 2 doses of Ativan this time and it was very unsafe to scan her. Her legs were coming off of the table and she was trying to sit up, her face went right into our head coil. She appeared calm before the exam, but that changed quickly. Unable to acquire the 1 min DWI sequence.

## 2022-01-31 NOTE — Progress Notes (Signed)
Neurology Progress Note  Brief HPI: 81 year old female with history of DM, PAF, HTN, ESRD on HD who presented with seizures. As family was putting patient to bed and patient became suddenly unresponsive with a fixed gaze, stiffened, and shook for 2 to 3 minutes.  Once at the ED, patient had witnessed recurrence of seizure activity. There is a questionable history of 3-4 seizures associated with hypoglycemia though blood glucose reported 60s to 70s and not severely low. Reviewing chart, there was a least once where the episode was bilateral tremulousness associated with a glucose of 46, so these previous episodes are in question.   Subjective: Unable to obtain MRI brain overnight due to patient unable to tolerate imaging. Per bedside RN, some concern for timing of sedation and imaging. Will attempt to repeat today. Examination improved from yesterday.  Patient no longer on vasopressors for blood pressure, and plan to transfer to hospitalist team today Patient sitting up in ICU bed on assessment, in no acute distress  Exam: Vitals:   01/31/22 0815 01/31/22 0900  BP: (!) 120/100 104/64  Pulse: 71 86  Resp: 14 15  Temp:    SpO2: 95% 94%   Gen: Laying comfortably in ICU bed, in no acute distress Resp: non-labored breathing, no respiratory distress on room air Abd: soft, non-tender  Neuro: Mental Status: Asleep initially, wakes easily to voice.  Alert to self and place.  She is unable to correctly state the month.  She is does state that she is in the hospital but claims that she is present for placement of nontunneled catheter.  Does not recall events leading to hospitalization. Speech is fluent, comprehension is intact. Follows commands without difficulty. Cranial Nerves: PERRL, tracks examiner, facial sensation is intact and symmetric to light touch, face is symmetric resting and with movement, hearing is intact to voice, tongue is midline Motor: Antigravity movement throughout. Patient does  seem a bit stronger on the right upper extremity compared to the left, uses her right upper extremity to initially elevate the left arm but is able to hold LUE antigravity without vertical drift.  BLE maintain antigravity movement without vertical drift.  Tone and bulk are normal.  Sensory: Sensation to light touch intact and symmetric bilaterally Gait: Deferred  Pertinent Labs: CBC    Component Value Date/Time   WBC 4.4 01/30/2022 0714   RBC 3.29 (L) 01/30/2022 0714   HGB 9.9 (L) 01/30/2022 0714   HGB 9.3 (L) 01/13/2021 1143   HCT 30.4 (L) 01/30/2022 0714   HCT 28.5 (L) 01/13/2021 1143   PLT 76 (L) 01/30/2022 0714   PLT 165 01/13/2021 1143   MCV 92.4 01/30/2022 0714   MCV 87 01/13/2021 1143   MCH 30.1 01/30/2022 0714   MCHC 32.6 01/30/2022 0714   RDW 14.8 01/30/2022 0714   RDW 14.2 01/13/2021 1143   LYMPHSABS 0.7 01/30/2022 0032   MONOABS 0.4 01/30/2022 0032   EOSABS 0.0 01/30/2022 0032   BASOSABS 0.0 01/30/2022 0032   CMP     Component Value Date/Time   NA 136 01/30/2022 0714   NA 140 05/20/2021 1159   K 4.8 01/30/2022 0714   CL 101 01/30/2022 0714   CO2 21 (L) 01/30/2022 0714   GLUCOSE 229 (H) 01/30/2022 0714   BUN 28 (H) 01/30/2022 0714   BUN 37 (H) 05/20/2021 1159   CREATININE 3.06 (H) 01/30/2022 0714   CALCIUM 8.7 (L) 01/30/2022 0714   PROT 7.5 01/30/2022 0032   ALBUMIN 3.6 01/30/2022 0032   AST  23 01/30/2022 0032   ALT 10 01/30/2022 0032   ALKPHOS 94 01/30/2022 0032   BILITOT 1.5 (H) 01/30/2022 0032   GFRNONAA 15 (L) 01/30/2022 0714   GFRAA 38 (L) 08/09/2019 1104   Urinalysis    Component Value Date/Time   COLORURINE AMBER (A) 01/30/2022 0233   APPEARANCEUR CLOUDY (A) 01/30/2022 0233   LABSPEC 1.016 01/30/2022 0233   PHURINE 5.0 01/30/2022 0233   GLUCOSEU NEGATIVE 01/30/2022 0233   HGBUR SMALL (A) 01/30/2022 0233   BILIRUBINUR NEGATIVE 01/30/2022 0233   KETONESUR NEGATIVE 01/30/2022 0233   PROTEINUR 100 (A) 01/30/2022 0233   NITRITE NEGATIVE  01/30/2022 0233   LEUKOCYTESUR LARGE (A) 01/30/2022 0233   Lipid Panel     Component Value Date/Time   CHOL 117 01/31/2022 0702   TRIG 82 01/31/2022 0702   HDL 38 (L) 01/31/2022 0702   CHOLHDL 3.1 01/31/2022 0702   VLDL 16 01/31/2022 0702   LDLCALC 63 01/31/2022 0702    Latest Reference Range & Units 01/30/22 02:33  Bacteria, UA NONE SEEN  MANY !  Budding Yeast  PRESENT  RBC / HPF 0 - 5 RBC/hpf 11-20  WBC, UA 0 - 5 WBC/hpf >50 (H)  !: Data is abnormal (H): Data is abnormally high  Imaging Reviewed:  [facility] 9/30:  Increased hypoattenuation within the right frontal and right parietal lobes concerning for acute infarcts with watershed distribution. MRI is recommended for further evaluation.  EEG 9/30: "This EEG was obtained while asleep and is abnormal due to moderate-to-severe diffuse slowing indicative of global cerebral dysfunction. Epileptiform abnormalities were not seen during this recording."  Impression: 81 year old female with unprovoked seizures.  She has a history of "seizures" with previous episodes of hypoglycemia but I wonder if she actually was having unprovoked seizures at that time as well. She had a significant physiological stress with her procedures 9/30, which could lower seizure threshold to some degree resulting in her presentation today with some concern for NPO status prior to tunneled catheter placement (? History of seizures with hypoglycemia) .  Further work up reveals patient with UTI which could also lower the seizure threshold.  Though there is some concern for increased hypoattenuation on CT, this appears chronic to me.  With her history of dementia, this is likely the etiology, but I would favor getting an MRI in the case, and this can further evaluate the hypoattenuation as well.  Recommendations: - MRI brain pending after failed attempt overnight - Continue seizure precautions - 2 mg IV Ativan PRN for seizure activity lasting > 3 minutes and notify  neurology - Continue Keppra 250 mg BID - Management of UTI/further infectious work up per primary team  Anibal Henderson, AGACNP-BC [facility] [phone]   NEUROHOSPITALIST ADDENDUM Performed a face to face diagnostic evaluation.   I have reviewed the contents of history and physical exam as documented by PA/ARNP/Resident and agree with above documentation.  I have discussed and formulated the above plan as documented. Edits to the note have been made as needed.  Impression/Key exam findings/Plan: She was transferred to ICU for hypotension requiring pressors but is awake, alert conversant with no focal deficit. No concern for seizures since starting Keppra. Continue Keppra 250 BID. MRI brain is pending for further evaluation of the concern for R frontal and R parietal lobe watershed infarct noted on CT Head. Routine EEG with slowing but no epileptiform discharges, no seizures. I added MR angio head and neck to get vessel imaging for concern for stroke.  No driving for 6 months. Has to be seizure free for 6 months before she can resume driving.  Donnetta Simpers, MD [facility] [phone]   If 7pm to 7am, please call on call as listed on AMION.

## 2022-01-31 NOTE — Evaluation (Signed)
Physical Therapy Evaluation Patient Details Name: [name] MRN: [id number] DOB: 1940-06-12 Today's Date: 01/31/2022  History of Present Illness  81 year old female who presented with seizures; family was helping her to bed and she became unresponsive with a fixed gaze, stiffened, and shook for 2 to 3 minutes. Work up pending. Pt with history of DM, PAF, HTN, ESRD on HD.    Clinical Impression  Pt in bed upon arrival of PT, lethargic upon arrival due to recently getting sedating meds for imaging prior to session. The pt did tolerate transition to sitting EOB with VSS, but was dependent on assist from therapist at this time and had no change in arousal with change in position. Therefore, further mobility progression deferred, will continue to attempt when pt more alert and update recommendations as appropriate.      Recommendations for follow up therapy are one component of a multi-disciplinary discharge planning process, led by the attending physician.  Recommendations may be updated based on patient status, additional functional criteria and insurance authorization.  Follow Up Recommendations Skilled nursing-short term rehab (<3 hours/day) Can patient physically be transported by private vehicle: No    Assistance Recommended at Discharge Frequent or constant Supervision/Assistance  Patient can return home with the following  Two people to help with walking and/or transfers;Two people to help with bathing/dressing/bathroom;Assistance with cooking/housework;Assistance with feeding;Direct supervision/assist for medications management;Direct supervision/assist for financial management;Assist for transportation;Help with stairs or ramp for entrance    Equipment Recommendations Wheelchair (measurements PT);Wheelchair cushion (measurements PT);Hospital bed  Recommendations for Other Services       Functional Status Assessment Patient has had a recent decline in their functional status and  demonstrates the ability to make significant improvements in function in a reasonable and predictable amount of time.     Precautions / Restrictions Precautions Precautions: Fall Restrictions Weight Bearing Restrictions: No      Mobility  Bed Mobility Overal bed mobility: Needs Assistance Bed Mobility: Rolling, Supine to Sit, Sit to Supine Rolling: Total assist, +2 for physical assistance, +2 for safety/equipment   Supine to sit: Total assist, +2 for physical assistance, +2 for safety/equipment Sit to supine: Total assist, +2 for physical assistance, +2 for safety/equipment   General bed mobility comments: pt with no attempt to assist or participate. no change in arousal with sitting EOB    Transfers                   General transfer comment: deferred due to LOA       Balance Overall balance assessment: Needs assistance   Sitting balance-Leahy Scale: Zero Sitting balance - Comments: dependent on therapist, R lateral lean and at times posterior lean                                     Pertinent Vitals/Pain Pain Assessment Pain Assessment: Faces Faces Pain Scale: Hurts a little bit Pain Location: mild grimace with cervical ROM Pain Descriptors / Indicators: Discomfort Pain Intervention(s): Monitored during session, Repositioned, Limited activity within patient's tolerance    Home Living Family/patient expects to be discharged to:: Private residence Living Arrangements: Children Available Help at Discharge: Family;Available 24 hours/day Type of Home: House Home Access: Stairs to enter Entrance Stairs-Rails: Can reach both Entrance Stairs-Number of Steps: 3   Home Layout: Able to live on main level with bedroom/bathroom;Two level Home Equipment: Rollator (4 wheels);Shower seat;Hospital bed Additional Comments: Set up taken  from admission 06/2021    Prior Function Prior Level of Function : Patient poor historian/Family not available              Mobility Comments: in 02/23 pt was amulatory with RW. Unsure of current mobility status ADLs Comments: in 06/2021. was was indep wtih ADLs     Hand Dominance   Dominant Hand: Right    Extremity/Trunk Assessment   Upper Extremity Assessment Upper Extremity Assessment: Defer to OT evaluation RUE Deficits / Details: rigid, difficult to move through PROM LUE Deficits / Details: rigid, difficult to move through PROM    Lower Extremity Assessment Lower Extremity Assessment: Generalized weakness;Difficult to assess due to impaired cognition (generalized increased tone, at times resisting movements from PT but unable to fully determine due to limited command following and arousal at this time.)    Cervical / Trunk Assessment Cervical / Trunk Assessment: Kyphotic;Other exceptions Cervical / Trunk Exceptions: preference for R head turn  Communication   Communication: Expressive difficulties;Receptive difficulties (no attempt at verbalizations, no following commands)  Cognition Arousal/Alertness: Lethargic Behavior During Therapy: Flat affect Overall Cognitive Status: Difficult to assess                                 General Comments: no command follow, no attempt to open eyes.        General Comments General comments (skin integrity, edema, etc.): VSS on RA, BP soft. Low of 98/50 (64) in supine, improved to 122/74 (86) in sitting, and then 106/60 (75) at end of session        Assessment/Plan    PT Assessment Patient needs continued PT services  PT Problem List Decreased strength;Decreased range of motion;Decreased activity tolerance;Decreased balance;Decreased coordination;Decreased mobility;Decreased cognition;Decreased safety awareness       PT Treatment Interventions DME instruction;Gait training;Stair training;Functional mobility training;Therapeutic exercise;Therapeutic activities;Balance training;Patient/family education    PT Goals (Current  goals can be found in the Care Plan section)  Acute Rehab PT Goals Patient Stated Goal: none stated PT Goal Formulation: Patient unable to participate in goal setting Time For Goal Achievement: 02/14/22 Potential to Achieve Goals: Good    Frequency Min 3X/week     Co-evaluation PT/OT/SLP Co-Evaluation/Treatment: Yes Reason for Co-Treatment: Complexity of the patient's impairments (multi-system involvement);For patient/therapist safety;To address functional/ADL transfers PT goals addressed during session: Mobility/safety with mobility;Strengthening/ROM;Balance OT goals addressed during session: ADL's and self-care       AM-PAC PT "6 Clicks" Mobility  Outcome Measure Help needed turning from your back to your side while in a flat bed without using bedrails?: Total Help needed moving from lying on your back to sitting on the side of a flat bed without using bedrails?: Total Help needed moving to and from a bed to a chair (including a wheelchair)?: Total Help needed standing up from a chair using your arms (e.g., wheelchair or bedside chair)?: Total Help needed to walk in hospital room?: Total Help needed climbing 3-5 steps with a railing? : Total 6 Click Score: 6    End of Session   Activity Tolerance: Patient limited by lethargy Patient left: in bed;with call bell/phone within reach;with bed alarm set Nurse Communication: Mobility status PT Visit Diagnosis: Other abnormalities of gait and mobility (R26.89);Muscle weakness (generalized) (M62.81);Adult, failure to thrive (R62.7);Unsteadiness on feet (R26.81)    Time: 1696-7893 PT Time Calculation (min) (ACUTE ONLY): 19 min   Charges:   PT Evaluation $PT Eval Moderate  Complexity: 1 Mod          [place] Carbo, PT, DPT   Acute Rehabilitation Department  Sandra Cockayne 01/31/2022, 3:14 PM

## 2022-02-01 ENCOUNTER — Encounter (HOSPITAL_COMMUNITY): Payer: Self-pay | Admitting: Vascular Surgery

## 2022-02-01 ENCOUNTER — Inpatient Hospital Stay (HOSPITAL_COMMUNITY): Payer: Medicare HMO

## 2022-02-01 ENCOUNTER — Ambulatory Visit (HOSPITAL_COMMUNITY): Payer: Medicare HMO

## 2022-02-01 DIAGNOSIS — G9340 Encephalopathy, unspecified: Secondary | ICD-10-CM

## 2022-02-01 LAB — BASIC METABOLIC PANEL
Anion gap: 14 (ref 5–15)
BUN: 40 mg/dL — ABNORMAL HIGH (ref 8–23)
CO2: 21 mmol/L — ABNORMAL LOW (ref 22–32)
Calcium: 8.8 mg/dL — ABNORMAL LOW (ref 8.9–10.3)
Chloride: 103 mmol/L (ref 98–111)
Creatinine, Ser: 3.63 mg/dL — ABNORMAL HIGH (ref 0.44–1.00)
GFR, Estimated: 12 mL/min — ABNORMAL LOW (ref >60)
Glucose, Bld: 152 mg/dL — ABNORMAL HIGH (ref 70–99)
Potassium: 5.5 mmol/L — ABNORMAL HIGH (ref 3.5–5.1)
Sodium: 138 mmol/L (ref 135–145)

## 2022-02-01 LAB — CBC
HCT: 35.5 % — ABNORMAL LOW (ref 36.0–46.0)
Hemoglobin: 11.1 g/dL — ABNORMAL LOW (ref 12.0–15.0)
MCH: 29.3 pg (ref 26.0–34.0)
MCHC: 31.3 g/dL (ref 30.0–36.0)
MCV: 93.7 fL (ref 80.0–100.0)
Platelets: 85 10*3/uL — ABNORMAL LOW (ref 150–400)
RBC: 3.79 MIL/uL — ABNORMAL LOW (ref 3.87–5.11)
RDW: 15.4 % (ref 11.5–15.5)
WBC: 6.3 10*3/uL (ref 4.0–10.5)
nRBC: 0 % (ref 0.0–0.2)

## 2022-02-01 LAB — MAGNESIUM: Magnesium: 1.9 mg/dL (ref 1.7–2.4)

## 2022-02-01 LAB — GLUCOSE, CAPILLARY
Glucose-Capillary: 166 mg/dL — ABNORMAL HIGH (ref 70–99)
Glucose-Capillary: 96 mg/dL (ref 70–99)
Glucose-Capillary: 124 mg/dL — ABNORMAL HIGH (ref 70–99)
Glucose-Capillary: 108 mg/dL — ABNORMAL HIGH (ref 70–99)
Glucose-Capillary: 104 mg/dL — ABNORMAL HIGH (ref 70–99)

## 2022-02-01 LAB — PHOSPHORUS: Phosphorus: 4.7 mg/dL — ABNORMAL HIGH (ref 2.5–4.6)

## 2022-02-01 LAB — HEPATITIS B SURFACE ANTIBODY, QUANTITATIVE: Hep B S AB Quant (Post): 4.3 m[IU]/mL — ABNORMAL LOW (ref >9.9)

## 2022-02-01 MED ORDER — HEPARIN SODIUM (PORCINE) 5000 UNIT/ML IJ SOLN
5000.0000 [IU] | Freq: Three times a day (TID) | INTRAMUSCULAR | Status: DC
  Administered 2022-02-01 – 2022-02-05 (×12): 5000 [IU] via SUBCUTANEOUS
  Filled 2022-02-01 (×13): qty 1

## 2022-02-01 MED ORDER — SODIUM CHLORIDE 0.9 % IV BOLUS
500.0000 mL | Freq: Once | INTRAVENOUS | Status: AC
  Administered 2022-02-01: 500 mL via INTRAVENOUS

## 2022-02-01 MED ORDER — DEXMEDETOMIDINE HCL IN NACL 400 MCG/100ML IV SOLN
0.4000 ug/kg/h | INTRAVENOUS | Status: DC
  Administered 2022-02-01: 0.4 ug/kg/h via INTRAVENOUS
  Filled 2022-02-01: qty 100

## 2022-02-01 MED ORDER — LEVETIRACETAM 250 MG PO TABS
250.0000 mg | ORAL_TABLET | Freq: Two times a day (BID) | ORAL | Status: DC
  Administered 2022-02-01 – 2022-02-05 (×8): 250 mg via ORAL
  Filled 2022-02-01 (×10): qty 1

## 2022-02-01 MED ORDER — INSULIN ASPART 100 UNIT/ML IJ SOLN
0.0000 [IU] | Freq: Three times a day (TID) | INTRAMUSCULAR | Status: DC
  Administered 2022-02-02 (×2): 1 [IU] via SUBCUTANEOUS
  Administered 2022-02-03: 3 [IU] via SUBCUTANEOUS
  Administered 2022-02-04: 1 [IU] via SUBCUTANEOUS
  Administered 2022-02-05: 2 [IU] via SUBCUTANEOUS
  Administered 2022-02-05: 1 [IU] via SUBCUTANEOUS

## 2022-02-01 MED ORDER — CHLORHEXIDINE GLUCONATE CLOTH 2 % EX PADS
6.0000 | MEDICATED_PAD | Freq: Every day | CUTANEOUS | Status: DC
  Administered 2022-02-02 – 2022-02-05 (×4): 6 via TOPICAL

## 2022-02-01 NOTE — Progress Notes (Signed)
Neurology Progress Note   S:// Patient seen and examined this morning. Reports no new complaints    O:// Current vital signs: BP 117/63   Pulse 86   Temp (!) 96.7 F (35.9 C) (Axillary) Comment: Reported to the nurse, warm blankets applied  Resp 16   Ht 5\' 4"  (1.626 m)   Wt 60.2 kg   SpO2 98%   BMI 22.78 kg/m  Vital signs in last 24 hours: Temp:  [96.7 F (35.9 C)-97.8 F (36.6 C)] 96.7 F (35.9 C) (10/02 0800) Pulse Rate:  [59-90] 86 (10/02 1000) Resp:  [4-23] 16 (10/02 1000) BP: (91-124)/(48-103) 117/63 (10/02 1000) SpO2:  [79 %-100 %] 98 % (10/02 1000) General: Awake alert comfortably laying in bed HNT: Normocephalic atraumatic Lungs: Clear Cardiovascular: Regular rhythm Neurological exam She is awake alert oriented to self. She did not get her age or the month correct. Per attention concentration No dysarthria No aphasia Cranial nerves II to XII intact Motor examination with antigravity strength in all 4 extremities Sensation intact to light touch Coordination exam with no dysmetria Gait and DTRs deferred at this time   Medications  Current Facility-Administered Medications:    0.9 %  sodium chloride infusion, 250 mL, Intravenous, Continuous, Jennelle Human B, NP, Stopped at 02/01/22 0440   acetaminophen (TYLENOL) tablet 650 mg, 650 mg, Oral, Q4H PRN **OR** acetaminophen (TYLENOL) 160 MG/5ML solution 650 mg, 650 mg, Per Tube, Q4H PRN **OR** acetaminophen (TYLENOL) suppository 650 mg, 650 mg, Rectal, Q4H PRN, Shela Leff, MD   Chlorhexidine Gluconate Cloth 2 % PADS 6 each, 6 each, Topical, Q0600, Freddi Starr, MD, 6 each at 01/31/22 1006   dexmedetomidine (PRECEDEX) 400 MCG/100ML (4 mcg/mL) infusion, 0.4-1.2 mcg/kg/hr, Intravenous, Titrated, Agarwala, Ravi, MD, Last Rate: 6.02 mL/hr at 02/01/22 1007, 0.4 mcg/kg/hr at 02/01/22 1007   insulin aspart (novoLOG) injection 0-6 Units, 0-6 Units, Subcutaneous, TID WC, Agarwala, Ravi, MD   levETIRAcetam  (KEPPRA) tablet 250 mg, 250 mg, Oral, BID, Agarwala, Ravi, MD, 250 mg at 02/01/22 1000 Labs CBC    Component Value Date/Time   WBC 6.3 02/01/2022 0235   RBC 3.79 (L) 02/01/2022 0235   HGB 11.1 (L) 02/01/2022 0235   HGB 9.3 (L) 01/13/2021 1143   HCT 35.5 (L) 02/01/2022 0235   HCT 28.5 (L) 01/13/2021 1143   PLT 85 (L) 02/01/2022 0235   PLT 165 01/13/2021 1143   MCV 93.7 02/01/2022 0235   MCV 87 01/13/2021 1143   MCH 29.3 02/01/2022 0235   MCHC 31.3 02/01/2022 0235   RDW 15.4 02/01/2022 0235   RDW 14.2 01/13/2021 1143   LYMPHSABS 0.7 01/30/2022 0032   MONOABS 0.4 01/30/2022 0032   EOSABS 0.0 01/30/2022 0032   BASOSABS 0.0 01/30/2022 0032    CMP     Component Value Date/Time   NA 138 02/01/2022 0235   NA 140 05/20/2021 1159   K 5.5 (H) 02/01/2022 0235   CL 103 02/01/2022 0235   CO2 21 (L) 02/01/2022 0235   GLUCOSE 152 (H) 02/01/2022 0235   BUN 40 (H) 02/01/2022 0235   BUN 37 (H) 05/20/2021 1159   CREATININE 3.63 (H) 02/01/2022 0235   CALCIUM 8.8 (L) 02/01/2022 0235   PROT 7.5 01/30/2022 0032   ALBUMIN 3.0 (L) 01/31/2022 1203   AST 23 01/30/2022 0032   ALT 10 01/30/2022 0032   ALKPHOS 94 01/30/2022 0032   BILITOT 1.5 (H) 01/30/2022 0032   GFRNONAA 12 (L) 02/01/2022 0235   GFRAA 38 (L) 08/09/2019 1104  Imaging I have reviewed images in epic and the results pertinent to this consultation are: CT head concerning for watershed infarcts-MRI recommended for further evaluation EEG 01/30/2022 moderate to severe diffuse slowing, no epileptiform abnormality seen.  Assessment: 81 year old woman with past history of diabetes, paroxysmal atrial fibrillation, ESRD and hypertension presented with seizures. Question of 3-4 seizures previously but reportedly in association with hypoglycemia at those times-although sugar levels were in the 60s or 70s, with only 1 seizure with sugars in the 40s.  This time around, brought in for unresponsiveness and fixed gaze along with stiffening  and shaking concerning for seizures. Started on antiepileptics by neurology.  Imaging-CT head-concerning for possible new infarcts-MRI needed for further evaluation Question if prior episodes that were considered to be seizures and the association with hypoglycemia were actually true seizures and had not much to do with her sugar levels because they were never too low.  Impression: Likely seizure disorder, rule out stroke  Recommendations: MRI brain as well as MR angio head and neck-when able to.  Discussed with PCCM attending who will start her on Precedex while still in the ICU.  After imaging under Precedex, she will be transferred over to the hospitalist service. Continue seizure precautions Continue Keppra 250 twice daily Management of UTI/infectious work-up per primary team Neurology will continue to follow with you. D/w Dr Lynetta Mare  -- Amie Portland, MD Neurologist [facility] Pager: [phone]

## 2022-02-01 NOTE — Evaluation (Signed)
Speech Language Pathology Evaluation Patient Details Name: [name] MRN: [id number] DOB: 1941-02-02 Today's Date: 02/01/2022 Time:  -     Problem List:  Patient Active Problem List   Diagnosis Date Noted   Acute CVA (cerebrovascular accident) ([place]) 01/30/2022   Seizures ([place]) 01/30/2022   UTI (urinary tract infection) 01/30/2022   Acute urinary retention 01/30/2022   ESRD (end stage renal disease) ([place]) 01/30/2022   Acute encephalopathy 01/30/2022   Thrombocytopenia ([place]), chronic 06/18/2021   Pyuria 06/13/2021   Acute on chronic diastolic CHF (congestive heart failure) ([place]) 06/12/2021   Normocytic anemia 06/12/2021   CKD (chronic kidney disease), stage V ([place]) 05/22/2021   ARF (acute renal failure) ([place]) 05/21/2021   Malnutrition of moderate degree 04/27/2021   Confusion    Wheezing    Bacteremia 04/25/2021   Sepsis with acute organ dysfunction without septic shock ([place]) 04/24/2021   Acute kidney injury superimposed on chronic kidney disease ([place]) 04/24/2021   Acute cystitis without hematuria 04/24/2021   Influenza A 09/06/2020   Persistent atrial fibrillation ([place]) 09/06/2020   DM (diabetes mellitus), type 2 with renal complications ([place]) 01/74/9449   Prolonged QT interval 09/06/2020   Abnormal gait 06/18/2020   Heart failure ([place]) 06/18/2020   Hyperglycemia due to type 2 diabetes mellitus ([place]) 06/18/2020   Hypertensive heart disease without congestive heart failure 06/18/2020   Mixed incontinence 06/18/2020   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   Pure hypercholesterolemia 06/18/2020   Stage 3 chronic kidney disease ([place]) 06/18/2020   Type 2 diabetes mellitus without complications ([place]) 67/59/1638   Vitamin D deficiency 06/18/2020   Essential hypertension 05/08/2019   Hyperlipidemia 05/08/2019   Bilateral lower extremity edema 05/08/2019   Failed hearing screening 02/02/2018   Impacted  cerumen of right ear 02/02/2018   Otalgia, right 02/02/2018   Past Medical History:  Past Medical History:  Diagnosis Date   CHF (congestive heart failure) ([place])    Chronic kidney disease    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   Diabetes mellitus without complication (HCC)    type 2   Dyspnea    with exertion   Hypertension    Neuromuscular disorder ([place])    neuropathy bil feet   PAF (paroxysmal atrial fibrillation) ([place]) 08/2020   Pneumonia    Pulmonary hypertension ([place])    Past Surgical History:  Past Surgical History:  Procedure Laterality Date   AV FISTULA PLACEMENT Left 06/18/2021   Procedure: LEFT ARM ARTERIOVENOUS (AV) FISTULA CREATION;  Surgeon: [name], MD;  Location: [facility];  Service: Vascular;  Laterality: Left;   AV FISTULA PLACEMENT Right 01/29/2022   Procedure: RIGHT ARM BRACHIOCEPHALIC ARTERIOVENOUS (AV) FISTULA CREATION;  Surgeon: Marty Heck, MD;  Location: [facility];  Service: Vascular;  Laterality: Right;   [place] Left 08/28/2021   Procedure: LEFT ARM BASILIC VEIN TRANSPOSITION;  Surgeon: [name], MD;  Location: [facility];  Service: Vascular;  Laterality: Left;   CARDIOVERSION N/A 01/14/2021   Procedure: CARDIOVERSION;  Surgeon: Adrian Prows, MD;  Location: [facility];  Service: Cardiovascular;  Laterality: N/A;   IR FLUORO GUIDE CV LINE RIGHT  06/15/2021   IR US GUIDE VASC ACCESS RIGHT  06/15/2021   TONSILLECTOMY     HPI:  81 year old female who presented from home on 9/29 with altered mental status.  Family reported patient became unresponsive as they were putting her to bed with a fixed gaze followed by  stiffening and shaking for 2-3 mins.  At baseline, she is oriented x 1 with dementia dx and has questionable history of seizures prior to thought associated with hypoglycemia previously. She was more responsive in ED, interactive and talking but then had recurrent seizure, receiving ativan and Keppra with Neurology  consulting.  CTH with concern of hypoattenuation in the right frontal and right parietal lobes concerning for acute infarcts with watershed distribution.  Other workup noted for UA with large leuks, many bacteria, WBC 50 (not new for patient and reportedly asymptomatic) started on ceftriaxone with UC sent, foley placed for urinary retention, and was given lasix in ER.   Assessment / Plan / Recommendation Clinical Impression  Pt demonstrates no overt change in function. Speech and language are intact. Pt oriented to self and place, says shes in the hospital because they want to given her a PICC line but denies having had a seizure, which I believe has been mentioned to her. She is able to follow one and two step commands, make her needs known and describe her living situation with her daughter very simply. She can sustain attention to a task for several minutes. Pt does not need any dedicated SLP f/u at this time as she is able to function at her baseline.    SLP Assessment  SLP Recommendation/Assessment: Patient does not need any further Speech [facility] SLP Visit Diagnosis: Cognitive communication deficit (R41.841)    Recommendations for follow up therapy are one component of a multi-disciplinary discharge planning process, led by the attending physician.  Recommendations may be updated based on patient status, additional functional criteria and insurance authorization.    Follow Up Recommendations  No SLP follow up    Assistance Recommended at Discharge     Functional Status Assessment    Frequency and Duration           SLP Evaluation Cognition  Overall Cognitive Status: History of cognitive impairments - at baseline Arousal/Alertness: Awake/alert Orientation Level: Oriented to person;Oriented to place;Disoriented to time;Disoriented to situation Attention: Focused;Sustained Focused Attention: Appears intact Sustained Attention: Appears intact Memory:  Impaired Memory Impairment: Decreased long term memory Problem Solving: Impaired Problem Solving Impairment: Verbal basic;Functional basic       Comprehension  Auditory Comprehension Overall Auditory Comprehension: Appears within functional limits for tasks assessed    Expression Verbal Expression Overall Verbal Expression: Appears within functional limits for tasks assessed   Oral / Motor  Oral Motor/Sensory Function Overall Oral Motor/Sensory Function: Within functional limits Motor Speech Overall Motor Speech: Appears within functional limits for tasks assessed            Gracielynn Birkel, Katherene Ponto 02/01/2022, 1:33 PM

## 2022-02-01 NOTE — Progress Notes (Signed)
Physical Therapy Treatment Patient Details Name: [name] MRN: [id number] DOB: Jan 16, 1941 Today's Date: 02/01/2022   History of Present Illness 81 year old female who presented with seizures; family was helping her to bed and she became unresponsive with a fixed gaze, stiffened, and shook for 2 to 3 minutes. Work up pending. Pt with history of DM, PAF, HTN, ESRD on HD    PT Comments    The pt was agreeable to session and more alert today compared to evaluation despite still being somewhat drowsy after sedating meds given for MRI earlier in afternoon. The pt was able to complete bed mobility with modA and increased time and cues, and completed x2 sit-stand with maxA to manage RW position and posterior lean. The pt was able to take small lateral steps along EOB but progressively needed increased assist to manage posterior LOB and max cues for stride length and step with RLE. Will continue to benefit from skilled PT to progress functional strength, endurance, stability, and activity tolerance.     Recommendations for follow up therapy are one component of a multi-disciplinary discharge planning process, led by the attending physician.  Recommendations may be updated based on patient status, additional functional criteria and insurance authorization.  Follow Up Recommendations  Skilled nursing-short term rehab (<3 hours/day) Can patient physically be transported by private vehicle: No   Assistance Recommended at Discharge Frequent or constant Supervision/Assistance  Patient can return home with the following Two people to help with walking and/or transfers;Two people to help with bathing/dressing/bathroom;Assistance with cooking/housework;Assistance with feeding;Direct supervision/assist for medications management;Direct supervision/assist for financial management;Assist for transportation;Help with stairs or ramp for entrance   Equipment Recommendations  Wheelchair (measurements  PT);Wheelchair cushion (measurements PT);Hospital bed    Recommendations for Other Services       Precautions / Restrictions Precautions Precautions: Fall Restrictions Weight Bearing Restrictions: No     Mobility  Bed Mobility Overal bed mobility: Needs Assistance Bed Mobility: Supine to Sit, Sit to Supine     Supine to sit: Mod assist Sit to supine: Mod assist   General bed mobility comments: modA to complete with cues and assist to LE and thrn trunk. pt maintains R lateral lean and posterior lean. decreased awareness but can correct when directly cued    Transfers Overall transfer level: Needs assistance   Transfers: Sit to/from Stand Sit to Stand: Max assist           General transfer comment: maxA to RW and then to correct posterior lean once pt in standing. bracing BLE on bed and pulling on RW despite cues. completed x2    Ambulation/Gait Ambulation/Gait assistance: Max assist Gait Distance (Feet): 3 Feet (x2) Assistive device: Rolling walker (2 wheels) Gait Pattern/deviations: Step-to pattern, Decreased step length - right, Decreased stride length, Decreased dorsiflexion - right, Decreased dorsiflexion - left Gait velocity: decreased Gait velocity interpretation: <1.31 ft/sec, indicative of household ambulator   General Gait Details: small steps with minimal stride clearance wtih RLE. progressively increased assist due to posterior lean and pt picking up RW      Balance Overall balance assessment: Needs assistance Sitting-balance support: Bilateral upper extremity supported Sitting balance-Leahy Scale: Zero Sitting balance - Comments: falling to R and posteriorly, can correct when directly cued Postural control: Right lateral lean, Posterior lean Standing balance support: Bilateral upper extremity supported, During functional activity Standing balance-Leahy Scale: Poor Standing balance comment: maxA to maintain upright  Cognition Arousal/Alertness: Lethargic Behavior During Therapy: Flat affect Overall Cognitive Status: Difficult to assess                                 General Comments: pt able to follow cues with significantly increased time, suspect due to meds. answers simple questions        Exercises      General Comments General comments (skin integrity, edema, etc.): BP soft with all changes in position      Pertinent Vitals/Pain Pain Assessment Pain Assessment: No/denies pain Faces Pain Scale: Hurts a little bit Pain Location: with transitions, denies pain Pain Descriptors / Indicators: Discomfort Pain Intervention(s): Monitored during session     PT Goals (current goals can now be found in the care plan section) Acute Rehab PT Goals Patient Stated Goal: none stated PT Goal Formulation: Patient unable to participate in goal setting Time For Goal Achievement: 02/14/22 Potential to Achieve Goals: Good Progress towards PT goals: Progressing toward goals    Frequency    Min 3X/week      PT Plan Current plan remains appropriate       AM-PAC PT "6 Clicks" Mobility   Outcome Measure  Help needed turning from your back to your side while in a flat bed without using bedrails?: A Lot Help needed moving from lying on your back to sitting on the side of a flat bed without using bedrails?: A Lot Help needed moving to and from a bed to a chair (including a wheelchair)?: A Lot Help needed standing up from a chair using your arms (e.g., wheelchair or bedside chair)?: A Lot Help needed to walk in hospital room?: Total Help needed climbing 3-5 steps with a railing? : Total 6 Click Score: 10    End of Session Equipment Utilized During Treatment: Gait belt Activity Tolerance: Patient limited by lethargy Patient left: in bed;with call bell/phone within reach;with bed alarm set Nurse Communication: Mobility status PT Visit Diagnosis: Other abnormalities of gait  and mobility (R26.89);Muscle weakness (generalized) (M62.81);Adult, failure to thrive (R62.7);Unsteadiness on feet (R26.81)     Time: 6203-5597 PT Time Calculation (min) (ACUTE ONLY): 19 min  Charges:  $Therapeutic Activity: 8-22 mins                     West Carbo, PT, DPT   Acute Rehabilitation Department   Sandra Cockayne 02/01/2022, 4:43 PM

## 2022-02-01 NOTE — Progress Notes (Signed)
Alerted MD Agarwala that patients bp was 67/43 on 0.2 of precedex. Was given orders to give 554mL bolus of NS.  Will administer and continue to act accordingly.

## 2022-02-01 NOTE — Addendum Note (Signed)
Addendum  created 02/01/22 1724 by Duane Boston, MD   Clinical Note Signed, Intraprocedure Blocks edited, SmartForm saved

## 2022-02-01 NOTE — Progress Notes (Signed)
[facility] Progress Note  Subjective: seen in room, in good spirits, no c/o  Vitals:   02/01/22 1151 02/01/22 1200 02/01/22 1300 02/01/22 1500  BP:  (!) 79/49 (!) 91/50 115/64  Pulse:  (!) 59    Resp:  13 (!) 21   Temp: (!) 97.3 F (36.3 C)     TempSrc: Axillary     SpO2:  96%    Weight:      Height:        Exam: GEN: Chronically ill-appearing, in no immediate distress ENT: NCAT EYES: EOMI CV: Regular, normal S1 and S2, normal rate PULM: Coarse breath sounds bilaterally, normal work of breathing ABD: Soft, nontender SKIN: Incisional site and right AC fossa intact, clean and dry; hyperpigmentation and thickening of the skin in the lower extremities EXT: Trace pretibial edema bilaterally VASC: RUE AVF +B NEURO: AAO to self, location, year    OP HD: [facility]TS   TDC  57.7kg   400/1.5   3K/2.5Ca bath  Hep none - calcitriol 0.5 ug po tiw - no esa  Assessment/ Plan: AMS - possible seizures. Question acute ischemic CVA by [facility] 9/29, MRI today. Neuro is following. Hx of dementia.  Shock / hypotension - better today.  Getting IV abx. UCx +enterococcus faecalis ESRD - on TTS HD. HD tomorrow.  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 Hb 9-11 here, no esa needs HFpEF DM2  [name] 02/01/2022, 3:56 PM   Recent Labs  Lab 01/30/22 0032 01/30/22 0714 01/31/22 1203 02/01/22 0235  HGB 11.8* 9.9*  --  11.1*  ALBUMIN 3.6  --  3.0*  --   CALCIUM 9.5 8.7* 8.5* 8.8*  PHOS  --   --  4.7* 4.7*  CREATININE 2.87* 3.06* 3.61* 3.63*  K 5.6* 4.8 4.5 5.5*   No results for input(s): "IRON", "TIBC", "FERRITIN" in the last 168 hours. Inpatient medications:  Chlorhexidine Gluconate Cloth  6 each Topical Q0600   heparin injection (subcutaneous)  5,000 Units Subcutaneous Q8H   insulin aspart  0-6 Units Subcutaneous TID WC   levETIRAcetam  250 mg Oral BID    sodium chloride Stopped (02/01/22 0440)   dexmedetomidine (PRECEDEX) IV  infusion Stopped (02/01/22 1320)   acetaminophen **OR** acetaminophen (TYLENOL) oral liquid 160 mg/5 mL **OR** acetaminophen

## 2022-02-01 NOTE — Progress Notes (Signed)
MR brain completed Results reviewed and pasted below IMPRESSION: 1. No acute intracranial pathology. 2. 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 3. Diminished flow related signal in multiple proximal right M2 branches likely reflecting atherosclerotic narrowing with normal enhancement in the more distal vessels. 4. Occluded or highly stenotic right PCA at the P1/P2 junction with distal reconstitution. Moderate irregularity and narrowing of the proximal left PCA with distal reconstitution. 5. Markedly degraded evaluation of the vasculature of the neck due to artifact on this noncontrast time-of-flight MRA. 6. Absent flow related enhancement in the left ICA in the neck may be in part artifactual. Otherwise there is antegrade flow in the right carotid system and bilateral vertebral arteries; estimation of atherosclerotic narrowing is precluded due to the degree of artifact. Consider CTA of the neck for better evaluation given less susceptibility to motion artifact if indicated. 7. Right pleural effusion.   Updated recs: No evidence of stroke on MRI brain. MR angiography not ideal study due to artifact to properly visualize neck vessels but given no evidence of acute stroke, I do not think any further work-up is needed from this perspective. Continue Keppra 250 twice daily-most likely seizure disorder, threshold lowered by UTI. Neurology will follow  -- Amie Portland, MD Neurologist [facility] Pager: [phone]

## 2022-02-01 NOTE — Progress Notes (Signed)
NAME:  [name], MRN:  [id number], DOB:  1940/06/02, LOS: 2 ADMISSION DATE:  01/30/2022, CONSULTATION DATE:  01/30/22 REFERRING MD:  Dr. Doristine Bosworth, CHIEF COMPLAINT:  AMS   History of Present Illness:  HPI obtained from medical chart review as patient is unable to provide hx, no family in room.    81 year old female who presented from home on 9/29 with altered mental status.  Family reported patient became unresponsive as they were putting her to bed with a fixed gaze followed by stiffening and shaking for 2-3 mins.  At baseline, she is oriented x 1 and has questionable history of seizures prior to thought associated with hypoglycemia previously.  By EMS arrival, she was more alert with CBG in 300's.    In ER, she was afebrile, normotensive, and with normal saturations.  She was more responsive, interactive and talking but then had recurrent seizure, receiving ativan and Keppra with Neurology consulting.  CTH with concern of hypoattenuation in the right frontal and right parietal lobes concerning for acute infarcts with watershed distribution.  Other workup noted for UA with large leuks, many bacteria, WBC 50 (not new for patient and reportedly asymptomatic) started on ceftriaxone with UC sent, foley placed for urinary retention, and was given lasix in ER.  Labs with K 5.6, normal LFTs except t.bili 1.5, trop hs 22- 24, and BNP 1432, Hgb 11.8, plts 78 (at baseline), CXR non acute.  Since lasix, her blood pressure has continued to decline with mild and brief improvement after NS 250 ml bolus.  Her mental status mostly somnolent in ER, with no further clinical seizures reported.   PCCM consulted for ongoing hypotension.    Pertinent  Medical History  ESRD on iHD TTS (via R TDC s/p R brachiocephalic AVF placement by [facility] 01/29/22), HFpEF, IDDM, HTN (not on meds), HLD, Afib not on AC 2/ 2chronic thrombocytopenia, anemia  Significant Hospital Events: Including procedures, antibiotic start and stop dates in  addition to other pertinent events   9/29 admit to [facility], neurology consulted 10/3 off norepinephrine, no further seizures  Interim History / Subjective:  Norepinephrine weaned to off Room air She was unable to get her MRI last night due to agitation  Objective   Blood pressure 124/72, pulse 90, temperature (!) 96.7 F (35.9 C), temperature source Axillary, resp. rate 19, height 5\' 4"  (1.626 m), weight 60.2 kg, SpO2 99 %.        Intake/Output Summary (Last 24 hours) at 02/01/2022 4825 Last data filed at 02/01/2022 0600 Gross per 24 hour  Intake --  Output 485 ml  Net -485 ml    Filed Weights   01/30/22 0007 01/30/22 1400  Weight: 69.4 kg 60.2 kg   Examination: General:  `   Chronically ill woman laying in bed in no distress HEENT: Oropharynx dry, pupils equal Neuro: She is awake, oriented to self, situation, not to date.  Moves all extremities although globally weak.  Answers questions appropriately. Mild proximal weakness. CV: Irregularly irregular, 2/6 systolic murmur PULM: Clear bilaterally, no wheezes or crackles GI: Nondistended, positive bowel sounds Extremities: No edema, chronic venous stasis changes Skin: no rashes   Ancillary tests personally reviewed   Normal EF.  RV pressure overload.  Moderately elevated RVSP with severe tricuspid regurgitation.  No significant change from prior study.  Assessment & Plan:   Encephalopathy concerning for seizures but likely due to metabolic disturbances.  Hx of dementia> reported baseline is oriented x1 -For MRI today with dexmedetomidine  as sedation -Continue Keppra orally.  ESRD on iHD, TTS Urinary retention Hyperkalemia- mild -Continue hemodialysis as per nephrology.  HFpEF Suspected PAH -No change in prior echo.   Afib with acceptable rate control. 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  Continue current insulin regimen.  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  Will attempt again soon.   Kipp Brood, MD [facility] ICU Physician [place]  Pager: [phone] Or Epic Secure Chat After hours: [phone].  02/01/2022, 8:44 AM

## 2022-02-01 NOTE — Progress Notes (Signed)
Pt receives out-pt HD at [facility] on TTS. Pt arrives at 10:40 for 11:00 chair time. Will assist as needed.   Melven Sartorius Renal Navigator [phone]

## 2022-02-02 DIAGNOSIS — I639 Cerebral infarction, unspecified: Secondary | ICD-10-CM | POA: Diagnosis not present

## 2022-02-02 DIAGNOSIS — G9340 Encephalopathy, unspecified: Secondary | ICD-10-CM | POA: Diagnosis not present

## 2022-02-02 LAB — URINE CULTURE: Culture: >=100000 — AB

## 2022-02-02 LAB — GLUCOSE, CAPILLARY
Glucose-Capillary: 89 mg/dL (ref 70–99)
Glucose-Capillary: 165 mg/dL — ABNORMAL HIGH (ref 70–99)
Glucose-Capillary: 200 mg/dL — ABNORMAL HIGH (ref 70–99)
Glucose-Capillary: 170 mg/dL — ABNORMAL HIGH (ref 70–99)

## 2022-02-02 MED ORDER — HEPARIN SODIUM (PORCINE) 1000 UNIT/ML IJ SOLN
INTRAMUSCULAR | Status: AC
  Filled 2022-02-02: qty 4

## 2022-02-02 NOTE — Progress Notes (Signed)
Physical Therapy Treatment Patient Details Name: [name] MRN: [id number] DOB: 1941/01/26 Today's Date: 02/02/2022   History of Present Illness 81 year old female who presented with seizures; family was helping her to bed and she became unresponsive with a fixed gaze, stiffened, and shook for 2 to 3 minutes. Work up pending. Pt with history of DM, PAF, HTN, ESRD on HD    PT Comments    The patient was agreeable to PT. She is able to follow single step commands with increased time. She continues to require assistance with bed mobility and transfers. Facilitation for anterior weight shifting in standing as patient with posterior lean. She declined attempting ambulation due to weakness. Recommend to continue PT to maximize independence and facilitate return to prior level of function. SNF recommended at discharge.    Recommendations for follow up therapy are one component of a multi-disciplinary discharge planning process, led by the attending physician.  Recommendations may be updated based on patient status, additional functional criteria and insurance authorization.  Follow Up Recommendations  Skilled nursing-short term rehab (<3 hours/day) Can patient physically be transported by private vehicle: No   Assistance Recommended at Discharge Frequent or constant Supervision/Assistance  Patient can return home with the following Two people to help with walking and/or transfers;Two people to help with bathing/dressing/bathroom;Assistance with cooking/housework;Assistance with feeding;Direct supervision/assist for medications management;Direct supervision/assist for financial management;Assist for transportation;Help with stairs or ramp for entrance   Equipment Recommendations  Wheelchair (measurements PT);Wheelchair cushion (measurements PT);Hospital bed    Recommendations for Other Services       Precautions / Restrictions Precautions Precautions: Fall Restrictions Weight Bearing  Restrictions: No     Mobility  Bed Mobility Overal bed mobility: Needs Assistance Bed Mobility: Supine to Sit, Sit to Supine     Supine to sit: Mod assist Sit to supine: Mod assist   General bed mobility comments: verba cues for sequencing and technique. increased time required    Transfers Overall transfer level: Needs assistance   Transfers: Sit to/from Stand Sit to Stand: Max assist           General transfer comment: lifting and lowering assistance provided. verbal cues for hand placement, anterior weight shifting.    Ambulation/Gait               General Gait Details: patient refused to attempt ambulation due to weakness and with limited standing tolerance   Stairs             Wheelchair Mobility    Modified Rankin (Stroke Patients Only)       Balance Overall balance assessment: Needs assistance Sitting-balance support: No upper extremity supported Sitting balance-Leahy Scale: Fair Sitting balance - Comments: sitting balance improved with increased sitting time. poor initially with posterior lean progressing to fair with increased sitting time and cues for anterior weight shifting Postural control: Posterior lean Standing balance support: Bilateral upper extremity supported Standing balance-Leahy Scale: Poor Standing balance comment: Mod A required to maintain standing balance with brief periods of Min A. standing tolerance was less than 2 minutes                            Cognition Arousal/Alertness: Awake/alert Behavior During Therapy: WFL for tasks assessed/performed Overall Cognitive Status: No family/caregiver present to determine baseline cognitive functioning  General Comments: patient is oriented to self and place. when asked why she is here, she states "for something stupid." she is able to follow single step commands consistently with extra time. increased time for  processing        Exercises      General Comments        Pertinent Vitals/Pain Pain Assessment Pain Assessment: No/denies pain    Home Living                          Prior Function            PT Goals (current goals can now be found in the care plan section) Acute Rehab PT Goals Patient Stated Goal: to return home PT Goal Formulation: With patient Time For Goal Achievement: 02/14/22 Potential to Achieve Goals: Good Progress towards PT goals: Progressing toward goals    Frequency    Min 3X/week      PT Plan Current plan remains appropriate    Co-evaluation              AM-PAC PT "6 Clicks" Mobility   Outcome Measure  Help needed turning from your back to your side while in a flat bed without using bedrails?: A Lot Help needed moving from lying on your back to sitting on the side of a flat bed without using bedrails?: A Lot Help needed moving to and from a bed to a chair (including a wheelchair)?: A Lot Help needed standing up from a chair using your arms (e.g., wheelchair or bedside chair)?: A Lot Help needed to walk in hospital room?: Total Help needed climbing 3-5 steps with a railing? : Total 6 Click Score: 10    End of Session   Activity Tolerance: Patient tolerated treatment well Patient left: in bed;with call bell/phone within reach;with bed alarm set   PT Visit Diagnosis: Other abnormalities of gait and mobility (R26.89);Muscle weakness (generalized) (M62.81);Adult, failure to thrive (R62.7);Unsteadiness on feet (R26.81)     Time: 2500-3704 PT Time Calculation (min) (ACUTE ONLY): 18 min  Charges:  $Therapeutic Activity: 8-22 mins                     Minna Merritts, PT, MPT    Percell Locus 02/02/2022, 12:21 PM

## 2022-02-02 NOTE — Progress Notes (Signed)
[facility] Progress Note  Subjective: seen in room, no /co  Vitals:   02/02/22 0750 02/02/22 1132 02/02/22 1540 02/02/22 1558  BP: 114/61 121/62  115/68  Pulse: 98 85 93 87  Resp: 16 17 15  (!) 29  Temp: 98 F (36.7 C) 97.8 F (36.6 C) 98.5 F (36.9 C)   TempSrc: Axillary Oral    SpO2: 100%  99% 98%  Weight:   62.4 kg   Height:        Exam: GEN: Chronically ill-appearing, pleasant ENT: NCAT EYES: EOMI CV: Regular, normal S1 and S2, normal rate PULM: Coarse breath sounds bilaterally, normal work of breathing ABD: Soft, nontender SKIN: Incisional site and right AC fossa intact, clean and dry; hyperpigmentation and thickening of the skin in the lower extremities EXT: Trace pretibial edema bilaterally VASC: RUE AVF +B NEURO: AAO to self, location, year    OP HD: [facility]   TDC  57.7kg   400/1.5   3K/2.5Ca bath  Hep none - calcitriol 0.5 ug po tiw - no esa  Assessment/ Plan: AMS - possible seizures. Question acute ischemic CVA by [facility] 9/29, MRI did not show any new CVA.  Neuro is following. Hx of dementia. MS improving gradually.  Shock / hypotension - resolved, getting IV abx.  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 HD today.  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 Hb 9-11 here, no esa needs HFpEF DM2  Rob Cerenity Goshorn 02/02/2022, 4:15 PM   Recent Labs  Lab 01/30/22 0032 01/30/22 0714 01/31/22 1203 02/01/22 0235  HGB 11.8* 9.9*  --  11.1*  ALBUMIN 3.6  --  3.0*  --   CALCIUM 9.5 8.7* 8.5* 8.8*  PHOS  --   --  4.7* 4.7*  CREATININE 2.87* 3.06* 3.61* 3.63*  K 5.6* 4.8 4.5 5.5*    No results for input(s): "IRON", "TIBC", "FERRITIN" in the last 168 hours. Inpatient medications:  Chlorhexidine Gluconate Cloth  6 each Topical Q0600   heparin injection (subcutaneous)  5,000 Units Subcutaneous Q8H   insulin aspart  0-6 Units Subcutaneous TID WC   levETIRAcetam  250 mg Oral BID    sodium  chloride Stopped (02/01/22 0440)   dexmedetomidine (PRECEDEX) IV infusion Stopped (02/01/22 1320)   acetaminophen **OR** acetaminophen (TYLENOL) oral liquid 160 mg/5 mL **OR** acetaminophen

## 2022-02-02 NOTE — Progress Notes (Signed)
PROGRESS NOTE    [name]  IRS:[id number] DOB: [birth year] DOA: 01/30/2022 PCP: Trey Sailors, PA   Brief Narrative:  81 year old female who presented from home on 9/29 with altered mental status.  Family reported patient became unresponsive as they were putting her to bed with a fixed gaze followed by stiffening and shaking for 2-3 mins.  At baseline, she is oriented x 1 and has questionable history of seizures prior to thought associated with hypoglycemia previously.  By EMS arrival, she was more alert with CBG in 300's.     In ER, she was afebrile, normotensive, and with normal saturations.  She was more responsive, interactive and talking but then had recurrent seizure, receiving ativan and Keppra with Neurology consulting.  CTH with concern of hypoattenuation in the right frontal and right parietal lobes concerning for acute infarcts with watershed distribution.  Other workup noted for UA with large leuks, many bacteria, WBC 50 (not new for patient and reportedly asymptomatic) started on ceftriaxone with UC sent, foley placed for urinary retention, and was given lasix in ER.  Labs with K 5.6, normal LFTs except t.bili 1.5, trop hs 22- 24, and BNP 1432, Hgb 11.8, plts 78 (at baseline), CXR non acute.  Since lasix, her blood pressure has continued to decline with mild and brief improvement after NS 250 ml bolus.  Her mental status mostly somnolent in ER, with no further clinical seizures reported.   PCCM consulted for ongoing hypotension.  She required vasopressors.  Transferred back to [facility] on 02/02/2022.  Assessment & Plan:   Principal Problem:   Acute CVA (cerebrovascular accident) ([place]) Active Problems:   DM (diabetes mellitus), type 2 with renal complications ([place])   Seizures ([place])   UTI (urinary tract infection)   Acute urinary retention   ESRD (end stage renal disease) ([place])   Acute encephalopathy  Encephalopathy concerning for seizures but likely due to metabolic  disturbances.  Hx of dementia> reported baseline is oriented x1.  Neurology following.  MRI negative for acute stroke.  Positive for old stroke.  Does not have any focal deficit.  Patient in fact is alert and oriented x2 today which apparently is better than her baseline.  Neuro recommends continuing Keppra.  Seizure precautions.  Hypovolemic shock: Required pressors.  Now off of pressors.  Maintaining blood pressure within normal range but low normal.   ESRD on iHD, TTS Urinary retention Hyperkalemia- mild -Continue hemodialysis as per nephrology.  Chronic diastolic CHF: Appears euvolemic.  Volume management by hemodialysis.   HFpEF Suspected PAH -No change in prior echo.   Afib with acceptable rate control. -Continue holding home diltiazem, blood pressure at low normal at times.   IDDM type II, poorly controlled at home A1c 8.2 -Improved CBG while in hospital.  Continue current insulin regimen which is only SSI.   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 -Follow intermittent CBC  DVT prophylaxis: heparin injection 5,000 Units Start: 02/01/22 1400 SCD's Start: 01/30/22 0543   Code Status: Full Code  Family Communication:  None present at bedside.  Plan of care discussed with patient in length and he/she verbalized understanding and agreed with it.  Status is: Inpatient Remains inpatient appropriate because: Now medically stable.  Needs placement to SNF.   Estimated body mass index is 22.78 kg/m as calculated from the following:   Height as of this encounter: 5\' 4"  (1.626 m).   Weight as of this encounter: 60.2 kg.    Nutritional  Assessment: Body mass index is 22.78 kg/m.Marland Kitchen Seen by dietician.  I agree with the assessment and plan as outlined below: Nutrition Status:        . Skin Assessment: I have examined the patient's skin and I agree with the wound assessment as performed by the wound care RN as outlined below:     Consultants:  Neuro   Procedures:  None  Antimicrobials:  Anti-infectives (From admission, onward)    Start     Dose/Rate Route Frequency Ordered Stop   01/31/22 0330  cefTRIAXone (ROCEPHIN) 1 g in sodium chloride 0.9 % 100 mL IVPB  Status:  Discontinued        1 g 200 mL/hr over 30 Minutes Intravenous Every 24 hours 01/30/22 0546 02/01/22 0844   01/30/22 0330  cefTRIAXone (ROCEPHIN) 1 g in sodium chloride 0.9 % 100 mL IVPB        1 g 200 mL/hr over 30 Minutes Intravenous  Once 01/30/22 0324 01/30/22 0525         Subjective: Patient seen and examined.  She is alert and oriented x2.  She has no complaints.  She says she is trying to eat her breakfast.  Objective: Vitals:   02/02/22 0203 02/02/22 0228 02/02/22 0257 02/02/22 0750  BP: (!) 106/58 110/61 116/71 114/61  Pulse: 94 90 88 98  Resp: 13 14 16 16   Temp:  98.2 F (36.8 C) 98.1 F (36.7 C) 98 F (36.7 C)  TempSrc:  Oral Oral Axillary  SpO2: 100% 100% 100% 100%  Weight:      Height:        Intake/Output Summary (Last 24 hours) at 02/02/2022 0858 Last data filed at 02/02/2022 0350 Gross per 24 hour  Intake 682.2 ml  Output 175 ml  Net 507.2 ml   Filed Weights   01/30/22 0007 01/30/22 1400  Weight: 69.4 kg 60.2 kg    Examination:  General exam: Appears calm and comfortable  Respiratory system: Clear to auscultation. Respiratory effort normal. Cardiovascular system: S1 & S2 heard, RRR. No JVD, murmurs, rubs, gallops or clicks. No pedal edema. Gastrointestinal system: Abdomen is nondistended, soft and nontender. No organomegaly or masses felt. Normal bowel sounds heard. Central nervous system: Alert and oriented x2. No focal neurological deficits. Extremities: Symmetric 5 x 5 power. Skin: No rashes, lesions or ulcers  Data Reviewed: I have personally reviewed following labs and imaging studies  CBC: Recent Labs  Lab 01/29/22 0741 01/30/22 0032 01/30/22 0714 02/01/22 0235  WBC  --  4.5 4.4 6.3   NEUTROABS  --  3.3  --   --   HGB 13.9 11.8* 9.9* 11.1*  HCT 41.0 37.5 30.4* 35.5*  MCV  --  94.5 92.4 93.7  PLT  --  78* 76* 85*   Basic Metabolic Panel: Recent Labs  Lab 01/29/22 0741 01/30/22 0032 01/30/22 0714 01/31/22 1203 02/01/22 0235  NA 139 137 136 138 138  K 4.2 5.6* 4.8 4.5 5.5*  CL 97* 99 101 103 103  CO2  --  22 21* 21* 21*  GLUCOSE 126* 287* 229* 132* 152*  BUN 17 24* 28* 35* 40*  CREATININE 2.00* 2.87* 3.06* 3.61* 3.63*  CALCIUM  --  9.5 8.7* 8.5* 8.8*  MG  --   --   --   --  1.9  PHOS  --   --   --  4.7* 4.7*   GFR: Estimated Creatinine Clearance: 10.7 mL/min (A) (by C-G formula based on SCr of 3.63  mg/dL (H)). Liver Function Tests: Recent Labs  Lab 01/30/22 0032 01/31/22 1203  AST 23  --   ALT 10  --   ALKPHOS 94  --   BILITOT 1.5*  --   PROT 7.5  --   ALBUMIN 3.6 3.0*   No results for input(s): "LIPASE", "AMYLASE" in the last 168 hours. No results for input(s): "AMMONIA" in the last 168 hours. Coagulation Profile: No results for input(s): "INR", "PROTIME" in the last 168 hours. Cardiac Enzymes: No results for input(s): "CKTOTAL", "CKMB", "CKMBINDEX", "TROPONINI" in the last 168 hours. BNP (last 3 results) Recent Labs    05/12/21 1311 05/20/21 1159  PROBNP 6,865* 10,030*   HbA1C: No results for input(s): "HGBA1C" in the last 72 hours. CBG: Recent Labs  Lab 02/01/22 1129 02/01/22 1543 02/01/22 2120 02/02/22 0301 02/02/22 0748  GLUCAP 124* 108* 104* 89 165*   Lipid Profile: Recent Labs    01/31/22 0702  CHOL 117  HDL 38*  LDLCALC 63  TRIG 82  CHOLHDL 3.1   Thyroid Function Tests: No results for input(s): "TSH", "T4TOTAL", "FREET4", "T3FREE", "THYROIDAB" in the last 72 hours. Anemia Panel: No results for input(s): "VITAMINB12", "FOLATE", "FERRITIN", "TIBC", "IRON", "RETICCTPCT" in the last 72 hours. Sepsis Labs: Recent Labs  Lab 01/30/22 1037 01/30/22 1502  LATICACIDVEN 3.0* 2.4*    Recent Results (from the past 240  hour(s))  Urine Culture     Status: Abnormal (Preliminary result)   Collection Time: 01/30/22  2:33 AM   Specimen: Urine, Clean Catch  Result Value Ref Range Status   Specimen Description URINE, CLEAN CATCH  Final   Special Requests NONE  Final   Culture (A)  Final    >=100,000 COLONIES/mL ENTEROCOCCUS FAECALIS SUSCEPTIBILITIES TO FOLLOW Performed at [facility], [place] [address], [place], [place] [address]    Report Status PENDING  Incomplete  MRSA Next Gen by PCR, Nasal     Status: None   Collection Time: 01/30/22  2:22 PM   Specimen: Nasal Mucosa; Nasal Swab  Result Value Ref Range Status   MRSA by PCR Next Gen NOT DETECTED NOT DETECTED Final    Comment: (NOTE) The GeneXpert MRSA Assay (FDA approved for NASAL specimens only), is one component of a comprehensive MRSA colonization surveillance program. It is not intended to diagnose MRSA infection nor to guide or monitor treatment for MRSA infections. Test performance is not FDA approved in patients less than 28 years old. Performed at [facility], [place] [address], [place], [place] [address]      Radiology Studies: MR BRAIN WO CONTRAST  Result Date: 02/01/2022 CLINICAL DATA:  Seizures. Found unresponsive with fixed gaze, and stiffness. EXAM: MRI HEAD WITHOUT CONTRAST MRA HEAD WITHOUT CONTRAST MRA NECK WITHOUT CONTRAST TECHNIQUE: Multiplanar, multiecho pulse sequences of the brain and surrounding structures were obtained without intravenous contrast. Angiographic images of the Circle of Willis were obtained using MRA technique without intravenous contrast. Angiographic images of the neck were obtained using MRA technique without intravenous contrast. Carotid stenosis measurements (when applicable) are obtained utilizing NASCET criteria, using the distal internal carotid diameter as the denominator. COMPARISON:  CT head 01/30/2022 FINDINGS: MRI HEAD FINDINGS Brain: There is no acute intracranial hemorrhage, extra-axial fluid  collection, or acute infarct. There is moderate background parenchymal volume loss with prominence of the ventricular system and extra-axial CSF spaces. There is extensive confluent FLAIR signal abnormality throughout the supratentorial white matter which is nonspecific but likely reflects sequela of advanced chronic small  vessel ischemic change. There are superimposed remote cortical infarcts in the right frontal and parietal lobes and left cerebellar hemisphere with encephalomalacia and gliosis. T1 hyperintensity and susceptibility overlying the remote right frontal infarct likely reflects cortical laminar necrosis and/or small amount of chronic blood products. There is no solid mass lesion. There is no mass effect or midline shift. Vascular: Normal flow voids. Skull and upper cervical spine: Definite marrow signal abnormality; however, the sagittal T1 sequence is essentially nondiagnostic. Sinuses/Orbits: The imaged paranasal sinuses are clear. Bilateral lens implants are in place. The globes and orbits are otherwise unremarkable. Other: None. MRA HEAD FINDINGS Anterior circulation: The intracranial ICAs are patent without evidence of hemodynamically significant stenosis or occlusion. The right M1 segment is patent. There is attenuated signal in the proximal M2 branches likely reflecting atherosclerotic narrowing. The distal branches are perfused. There is no proximal occlusion. The left M1 segment and distal branches appear patent without proximal stenosis or occlusion. The bilateral ACAs are patent without proximal stenosis or occlusion. There is no aneurysm or AVM. Posterior circulation: The bilateral V4 segments appear patent though evaluation is degraded by artifact. The V4 segment flow voids are present on the T2 sequence. There is no convincing evidence of high-grade stenosis or occlusion. The basilar artery is patent. Bilateral posterior communicating arteries are seen. The right PCA is severely stenotic  or occluded at the P1/P2 junction with reconstitution of flow distally. There is moderate irregularity of the proximal left PCA without evidence of occlusion. There is normal flow related enhancement distally. There is no aneurysm or AVM. Anatomic variants: None MRA NECK FINDINGS The MRA images are markedly degraded by artifact. Aortic arch: Grossly unremarkable. Right carotid system: There is antegrade flow in the right carotid system. There is flow related enhancement within the common and internal carotid arteries to the level of the skull base. There is significant artifact of the bifurcation precluding estimation of atherosclerotic stenosis. Left carotid system: The left common carotid artery demonstrates flow related enhancement. There is absent flow related enhancement in the internal carotid artery which may be in part artifactual as the imaged portions of the upper cervical internal carotid artery and intracranial ICA demonstrate flow related enhancement on the time-of-flight intracranial MRA. Vertebral arteries: There is antegrade flow in both vertebral arteries though the proximal and distal vertebral arteries are markedly suboptimally assessed due to artifact. Other: There is a right pleural effusion. IMPRESSION: 1. No acute intracranial pathology. 2. 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 3. Diminished flow related signal in multiple proximal right M2 branches likely reflecting atherosclerotic narrowing with normal enhancement in the more distal vessels. 4. Occluded or highly stenotic right PCA at the P1/P2 junction with distal reconstitution. Moderate irregularity and narrowing of the proximal left PCA with distal reconstitution. 5. Markedly degraded evaluation of the vasculature of the neck due to artifact on this noncontrast time-of-flight MRA. 6. Absent flow related enhancement  in the left ICA in the neck may be in part artifactual. Otherwise there is antegrade flow in the right carotid system and bilateral vertebral arteries; estimation of atherosclerotic narrowing is precluded due to the degree of artifact. Consider CTA of the neck for better evaluation given less susceptibility to motion artifact if indicated. 7. Right pleural effusion. Electronically Signed   By: Valetta Mole M.D.   On: 02/01/2022 14:52   MR ANGIO HEAD WO CONTRAST  Result Date: 02/01/2022 CLINICAL DATA:  Seizures. Found unresponsive with fixed gaze, and stiffness. EXAM: MRI HEAD WITHOUT CONTRAST MRA HEAD WITHOUT CONTRAST MRA NECK WITHOUT CONTRAST TECHNIQUE: Multiplanar, multiecho pulse sequences of the brain and surrounding structures were obtained without intravenous contrast. Angiographic images of the Circle of Willis were obtained using MRA technique without intravenous contrast. Angiographic images of the neck were obtained using MRA technique without intravenous contrast. Carotid stenosis measurements (when applicable) are obtained utilizing NASCET criteria, using the distal internal carotid diameter as the denominator. COMPARISON:  CT head 01/30/2022 FINDINGS: MRI HEAD FINDINGS Brain: There is no acute intracranial hemorrhage, extra-axial fluid collection, or acute infarct. There is moderate background parenchymal volume loss with prominence of the ventricular system and extra-axial CSF spaces. There is extensive confluent FLAIR signal abnormality throughout the supratentorial white matter which is nonspecific but likely reflects sequela of advanced chronic small vessel ischemic change. There are superimposed remote cortical infarcts in the right frontal and parietal lobes and left cerebellar hemisphere with encephalomalacia and gliosis. T1 hyperintensity and susceptibility overlying the remote right frontal infarct likely reflects cortical laminar necrosis and/or small amount of chronic blood products.  There is no solid mass lesion. There is no mass effect or midline shift. Vascular: Normal flow voids. Skull and upper cervical spine: Definite marrow signal abnormality; however, the sagittal T1 sequence is essentially nondiagnostic. Sinuses/Orbits: The imaged paranasal sinuses are clear. Bilateral lens implants are in place. The globes and orbits are otherwise unremarkable. Other: None. MRA HEAD FINDINGS Anterior circulation: The intracranial ICAs are patent without evidence of hemodynamically significant stenosis or occlusion. The right M1 segment is patent. There is attenuated signal in the proximal M2 branches likely reflecting atherosclerotic narrowing. The distal branches are perfused. There is no proximal occlusion. The left M1 segment and distal branches appear patent without proximal stenosis or occlusion. The bilateral ACAs are patent without proximal stenosis or occlusion. There is no aneurysm or AVM. Posterior circulation: The bilateral V4 segments appear patent though evaluation is degraded by artifact. The V4 segment flow voids are present on the T2 sequence. There is no convincing evidence of high-grade stenosis or occlusion. The basilar artery is patent. Bilateral posterior communicating arteries are seen. The right PCA is severely stenotic or occluded at the P1/P2 junction with reconstitution of flow distally. There is moderate irregularity of the proximal left PCA without evidence of occlusion. There is normal flow related enhancement distally. There is no aneurysm or AVM. Anatomic variants: None MRA NECK FINDINGS The MRA images are markedly degraded by artifact. Aortic arch: Grossly unremarkable. Right carotid system: There is antegrade flow in the right carotid system. There is flow related enhancement within the common and internal carotid arteries to the level of the skull base. There is significant artifact of the bifurcation precluding estimation of atherosclerotic stenosis. Left carotid  system: The left common carotid artery demonstrates flow related enhancement. There is absent flow related enhancement in the internal carotid artery which may be in part artifactual as the imaged portions of the upper cervical internal carotid artery and intracranial ICA demonstrate flow related enhancement on the time-of-flight intracranial MRA. Vertebral arteries: There is antegrade flow in both vertebral arteries though the proximal and distal vertebral arteries are markedly suboptimally assessed due to artifact. Other: There is a right pleural effusion. IMPRESSION: 1. No acute intracranial pathology. 2. 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 3. Diminished flow related signal in multiple proximal right M2 branches  likely reflecting atherosclerotic narrowing with normal enhancement in the more distal vessels. 4. Occluded or highly stenotic right PCA at the P1/P2 junction with distal reconstitution. Moderate irregularity and narrowing of the proximal left PCA with distal reconstitution. 5. Markedly degraded evaluation of the vasculature of the neck due to artifact on this noncontrast time-of-flight MRA. 6. Absent flow related enhancement in the left ICA in the neck may be in part artifactual. Otherwise there is antegrade flow in the right carotid system and bilateral vertebral arteries; estimation of atherosclerotic narrowing is precluded due to the degree of artifact. Consider CTA of the neck for better evaluation given less susceptibility to motion artifact if indicated. 7. Right pleural effusion. Electronically Signed   By: Valetta Mole M.D.   On: 02/01/2022 14:52   MR ANGIO NECK WO CONTRAST  Result Date: 02/01/2022 CLINICAL DATA:  Seizures. Found unresponsive with fixed gaze, and stiffness. EXAM: MRI HEAD WITHOUT CONTRAST MRA HEAD WITHOUT CONTRAST MRA NECK WITHOUT CONTRAST TECHNIQUE:  Multiplanar, multiecho pulse sequences of the brain and surrounding structures were obtained without intravenous contrast. Angiographic images of the Circle of Willis were obtained using MRA technique without intravenous contrast. Angiographic images of the neck were obtained using MRA technique without intravenous contrast. Carotid stenosis measurements (when applicable) are obtained utilizing NASCET criteria, using the distal internal carotid diameter as the denominator. COMPARISON:  CT head 01/30/2022 FINDINGS: MRI HEAD FINDINGS Brain: There is no acute intracranial hemorrhage, extra-axial fluid collection, or acute infarct. There is moderate background parenchymal volume loss with prominence of the ventricular system and extra-axial CSF spaces. There is extensive confluent FLAIR signal abnormality throughout the supratentorial white matter which is nonspecific but likely reflects sequela of advanced chronic small vessel ischemic change. There are superimposed remote cortical infarcts in the right frontal and parietal lobes and left cerebellar hemisphere with encephalomalacia and gliosis. T1 hyperintensity and susceptibility overlying the remote right frontal infarct likely reflects cortical laminar necrosis and/or small amount of chronic blood products. There is no solid mass lesion. There is no mass effect or midline shift. Vascular: Normal flow voids. Skull and upper cervical spine: Definite marrow signal abnormality; however, the sagittal T1 sequence is essentially nondiagnostic. Sinuses/Orbits: The imaged paranasal sinuses are clear. Bilateral lens implants are in place. The globes and orbits are otherwise unremarkable. Other: None. MRA HEAD FINDINGS Anterior circulation: The intracranial ICAs are patent without evidence of hemodynamically significant stenosis or occlusion. The right M1 segment is patent. There is attenuated signal in the proximal M2 branches likely reflecting atherosclerotic narrowing. The  distal branches are perfused. There is no proximal occlusion. The left M1 segment and distal branches appear patent without proximal stenosis or occlusion. The bilateral ACAs are patent without proximal stenosis or occlusion. There is no aneurysm or AVM. Posterior circulation: The bilateral V4 segments appear patent though evaluation is degraded by artifact. The V4 segment flow voids are present on the T2 sequence. There is no convincing evidence of high-grade stenosis or occlusion. The basilar artery is patent. Bilateral posterior communicating arteries are seen. The right PCA is severely stenotic or occluded at the P1/P2 junction with reconstitution of flow distally. There is moderate irregularity of the proximal left PCA without evidence of occlusion. There is normal flow related enhancement distally. There is no aneurysm or AVM. Anatomic variants: None MRA NECK FINDINGS The MRA images are markedly degraded by artifact. Aortic arch: Grossly unremarkable. Right carotid system: There is antegrade flow in the right carotid system. There is flow related enhancement within  the common and internal carotid arteries to the level of the skull base. There is significant artifact of the bifurcation precluding estimation of atherosclerotic stenosis. Left carotid system: The left common carotid artery demonstrates flow related enhancement. There is absent flow related enhancement in the internal carotid artery which may be in part artifactual as the imaged portions of the upper cervical internal carotid artery and intracranial ICA demonstrate flow related enhancement on the time-of-flight intracranial MRA. Vertebral arteries: There is antegrade flow in both vertebral arteries though the proximal and distal vertebral arteries are markedly suboptimally assessed due to artifact. Other: There is a right pleural effusion. IMPRESSION: 1. No acute intracranial pathology. 2. Remote infarcts in the right frontal and parietal lobes and  left cerebellar hemisphere superimposed on background advanced chronic white matter microangiopathy. 3. Diminished flow related signal in multiple proximal right M2 branches likely reflecting atherosclerotic narrowing with normal enhancement in the more distal vessels. 4. Occluded or highly stenotic right PCA at the P1/P2 junction with distal reconstitution. Moderate irregularity and narrowing of the proximal left PCA with distal reconstitution. 5. Markedly degraded evaluation of the vasculature of the neck due to artifact on this noncontrast time-of-flight MRA. 6. Absent flow related enhancement in the left ICA in the neck may be in part artifactual. Otherwise there is antegrade flow in the right carotid system and bilateral vertebral arteries; estimation of atherosclerotic narrowing is precluded due to the degree of artifact. Consider CTA of the neck for better evaluation given less susceptibility to motion artifact if indicated. 7. Right pleural effusion. Electronically Signed   By: Valetta Mole M.D.   On: 02/01/2022 14:52   EEG adult  Result Date: 01/31/2022 Derek Jack, MD     01/31/2022  9:28 AM Routine EEG Report [name] is a 81 y.o. female with a history of seizures who is undergoing an EEG to evaluate for seizures. Report: This EEG was acquired with electrodes placed according to the International 10-20 electrode system (including Fp1, Fp2, F3, F4, C3, C4, P3, P4, O1, O2, T3, T4, T5, T6, A1, A2, Fz, Cz, Pz). The following electrodes were missing or displaced: none. There was no clear waking rhythm. Best background was composed of low-amplitude slowing at 3-5 Hz. This activity is reactive to stimulation. Sleep was identified by K complexes and sleep spindles. There was no focal slowing. There were no interictal epileptiform discharges. There were no electrographic seizures identified. Photic stimulation and hyperventilation were not performed. Impression and clinical correlation: This EEG was  obtained while asleep and is abnormal due to moderate-to-severe diffuse slowing indicative of global cerebral dysfunction. Epileptiform abnormalities were not seen during this recording. Su Monks, MD [facility] [phone] If 7pm- 7am, please page neurology on call as listed in [place].    Scheduled Meds:  Chlorhexidine Gluconate Cloth  6 each Topical Q0600   heparin injection (subcutaneous)  5,000 Units Subcutaneous Q8H   heparin sodium (porcine)       insulin aspart  0-6 Units Subcutaneous TID WC   levETIRAcetam  250 mg Oral BID   Continuous Infusions:  sodium chloride Stopped (02/01/22 0440)   dexmedetomidine (PRECEDEX) IV infusion Stopped (02/01/22 1320)     LOS: 3 days   Darliss Cheney, MD [facility]  02/02/2022, 8:58 AM   *Please note that this is a verbal dictation therefore any spelling or grammatical errors are due to the "[place]" system interpretation.  Please page via [place] and do not message via secure chat for urgent  patient care matters. Secure chat can be used for non urgent patient care matters.  How to contact the [facility] Attending or Consulting provider [place] or covering provider during after hours [place], for this patient?  Check the care team in [facility] and look for a) attending/consulting [facility] provider listed and b) the [facility] team listed. Page or secure chat [phone]. Log into [email or web address] and use [place] universal password to access. If you do not have the password, please contact the hospital operator. Locate the [facility] provider you are looking for under [facility] and page to a number that you can be directly reached. If you still have difficulty reaching the provider, please page the [facility] (Director on Call) for the Hospitalists listed on amion for assistance.

## 2022-02-02 NOTE — Progress Notes (Signed)
Pt back to room from HD. Alert and oriented. VSS.

## 2022-02-02 NOTE — Progress Notes (Signed)
Received patient in bed to unit.  Alert and oriented.  Informed consent signed and in chart.   Treatment initiated: 2251 Treatment completed: 0227  Patient tolerated well.  Transported back to the room  Alert, without acute distress.  Hand-off given to patient's nurse.   Access used: catheter Access issues: none  Total UF removed: 0 mls Medication(s) given: None Post HD VS: 98.2 110/61 77 14 100 RA Post HD weight: 61.2   [facility]

## 2022-02-02 NOTE — Progress Notes (Signed)
Neurology Progress Note   S:// Patient seen and examined this morning. Reports no new complaints    O:// Current vital signs: BP 114/61 (BP Location: Left Arm)   Pulse 98   Temp 98 F (36.7 C) (Axillary)   Resp 16   Ht 5\' 4"  (1.626 m)   Wt 60.2 kg   SpO2 100%   BMI 22.78 kg/m  Vital signs in last 24 hours: Temp:  [97.3 F (36.3 C)-98.2 F (36.8 C)] 98 F (36.7 C) (10/03 0750) Pulse Rate:  [54-139] 98 (10/03 0750) Resp:  [11-22] 16 (10/03 0750) BP: (79-145)/(49-81) 114/61 (10/03 0750) SpO2:  [71 %-100 %] 100 % (10/03 0750) General: Awake alert comfortably laying in bed HNT: Normocephalic atraumatic Lungs: Clear Cardiovascular: Regular rhythm Neurological exam She is awake alert oriented to self and the fact that she is in the hospital. She did not get her age or the month correct. Per attention concentration No dysarthria No aphasia Cranial nerves II to XII intact Motor examination with antigravity strength in all 4 extremities Sensation intact to light touch Coordination exam with no dysmetria Gait and DTRs deferred at this time   Medications  Current Facility-Administered Medications:    0.9 %  sodium chloride infusion, 250 mL, Intravenous, Continuous, Jennelle Human B, NP, Stopped at 02/01/22 0440   acetaminophen (TYLENOL) tablet 650 mg, 650 mg, Oral, Q4H PRN **OR** acetaminophen (TYLENOL) 160 MG/5ML solution 650 mg, 650 mg, Per Tube, Q4H PRN **OR** acetaminophen (TYLENOL) suppository 650 mg, 650 mg, Rectal, Q4H PRN, Shela Leff, MD   Chlorhexidine Gluconate Cloth 2 % PADS 6 each, 6 each, Topical, Q0600, Roney Jaffe, MD, 6 each at 02/02/22 0533   dexmedetomidine (PRECEDEX) 400 MCG/100ML (4 mcg/mL) infusion, 0.4-1.2 mcg/kg/hr, Intravenous, Titrated, Agarwala, Ravi, MD, Stopped at 02/01/22 1320   heparin injection 5,000 Units, 5,000 Units, Subcutaneous, Q8H, Agarwala, Ravi, MD, 5,000 Units at 02/02/22 0533   heparin sodium (porcine) 1000 UNIT/ML  injection, , , ,    insulin aspart (novoLOG) injection 0-6 Units, 0-6 Units, Subcutaneous, TID WC, Agarwala, Ravi, MD, 1 Units at 02/02/22 0846   levETIRAcetam (KEPPRA) tablet 250 mg, 250 mg, Oral, BID, Agarwala, Ravi, MD, 250 mg at 02/02/22 0849 Labs CBC    Component Value Date/Time   WBC 6.3 02/01/2022 0235   RBC 3.79 (L) 02/01/2022 0235   HGB 11.1 (L) 02/01/2022 0235   HGB 9.3 (L) 01/13/2021 1143   HCT 35.5 (L) 02/01/2022 0235   HCT 28.5 (L) 01/13/2021 1143   PLT 85 (L) 02/01/2022 0235   PLT 165 01/13/2021 1143   MCV 93.7 02/01/2022 0235   MCV 87 01/13/2021 1143   MCH 29.3 02/01/2022 0235   MCHC 31.3 02/01/2022 0235   RDW 15.4 02/01/2022 0235   RDW 14.2 01/13/2021 1143   LYMPHSABS 0.7 01/30/2022 0032   MONOABS 0.4 01/30/2022 0032   EOSABS 0.0 01/30/2022 0032   BASOSABS 0.0 01/30/2022 0032    CMP     Component Value Date/Time   NA 138 02/01/2022 0235   NA 140 05/20/2021 1159   K 5.5 (H) 02/01/2022 0235   CL 103 02/01/2022 0235   CO2 21 (L) 02/01/2022 0235   GLUCOSE 152 (H) 02/01/2022 0235   BUN 40 (H) 02/01/2022 0235   BUN 37 (H) 05/20/2021 1159   CREATININE 3.63 (H) 02/01/2022 0235   CALCIUM 8.8 (L) 02/01/2022 0235   PROT 7.5 01/30/2022 0032   ALBUMIN 3.0 (L) 01/31/2022 1203   AST 23 01/30/2022 0032   ALT  10 01/30/2022 0032   ALKPHOS 94 01/30/2022 0032   BILITOT 1.5 (H) 01/30/2022 0032   GFRNONAA 12 (L) 02/01/2022 0235   GFRAA 38 (L) 08/09/2019 1104     Imaging I have reviewed images in epic and the results pertinent to this consultation are: CT head concerning for watershed infarcts-MRI recommended for further evaluation EEG 01/30/2022 moderate to severe diffuse slowing, no epileptiform abnormality seen.  MR brain completed Results reviewed and pasted below IMPRESSION: 1. No acute intracranial pathology. 2. 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 3. Diminished flow related signal in multiple proximal right M2 branches likely reflecting atherosclerotic narrowing with normal enhancement in the more distal vessels. 4. Occluded or highly stenotic right PCA at the P1/P2 junction with distal reconstitution. Moderate irregularity and narrowing of the proximal left PCA with distal reconstitution. 5. Markedly degraded evaluation of the vasculature of the neck due to artifact on this noncontrast time-of-flight MRA. 6. Absent flow related enhancement in the left ICA in the neck may be in part artifactual. Otherwise there is antegrade flow in the right carotid system and bilateral vertebral arteries; estimation of atherosclerotic narrowing is precluded due to the degree of artifact. Consider CTA of the neck for better evaluation given less susceptibility to motion artifact if indicated. 7. Right pleural effusion.  Assessment: 81 year old woman with past history of diabetes, paroxysmal atrial fibrillation, ESRD and hypertension presented with seizures. Question of 3-4 seizures previously but reportedly in association with hypoglycemia at those times-although sugar levels were in the 60s or 70s, with only 1 seizure with sugars in the 40s.  This time around, brought in for unresponsiveness and fixed gaze along with stiffening and shaking concerning for seizures. Started on antiepileptics by neurology.  Imaging-CT head-concerning for possible new infarcts-MRI needed for further evaluation, which was completed and did not reveal any acute strokes.  Question if prior episodes that were considered to be seizures and the association with hypoglycemia were actually true seizures and had not much to do with her sugar levels because they were never too low.  Impression: Likely seizure disorder, question of stroke on CT but MRI negative for stroke.  Recommendations: Continue seizure precautions Continue Keppra 250 twice daily-low-dose  due to advanced age and renal derangement Management of UTI/infectious work-up per primary team Neurology will be available as needed. Plan relayed to Dr. Doristine Bosworth  -- Amie [place], MD Neurologist [facility] Pager: [phone]

## 2022-02-02 NOTE — Care Management Important Message (Signed)
Important Message  Patient Details  Name: [name] MRN: [id number] Date of Birth: 04/18/1941   Medicare Important Message Given:  Yes     Safir Michalec 02/02/2022, 2:20 PM

## 2022-02-03 DIAGNOSIS — I639 Cerebral infarction, unspecified: Secondary | ICD-10-CM | POA: Diagnosis not present

## 2022-02-03 LAB — GLUCOSE, CAPILLARY
Glucose-Capillary: 183 mg/dL — ABNORMAL HIGH (ref 70–99)
Glucose-Capillary: 147 mg/dL — ABNORMAL HIGH (ref 70–99)
Glucose-Capillary: 257 mg/dL — ABNORMAL HIGH (ref 70–99)
Glucose-Capillary: 122 mg/dL — ABNORMAL HIGH (ref 70–99)
Glucose-Capillary: 200 mg/dL — ABNORMAL HIGH (ref 70–99)

## 2022-02-03 NOTE — Plan of Care (Signed)
  Problem: Education: Goal: Ability to describe self-care measures that may prevent or decrease complications (Diabetes Survival Skills Education) will improve Outcome: Progressing Goal: Individualized Educational Video(s) Outcome: Progressing   Problem: Coping: Goal: Ability to adjust to condition or change in health will improve Outcome: Progressing   Problem: Fluid Volume: Goal: Ability to maintain a balanced intake and output will improve Outcome: Progressing   Problem: Health Behavior/Discharge Planning: Goal: Ability to identify and utilize available resources and services will improve Outcome: Progressing Goal: Ability to manage health-related needs will improve Outcome: Progressing   Problem: Metabolic: Goal: Ability to maintain appropriate glucose levels will improve Outcome: Progressing   Problem: Nutritional: Goal: Maintenance of adequate nutrition will improve Outcome: Progressing Goal: Progress toward achieving an optimal weight will improve Outcome: Progressing   Problem: Skin Integrity: Goal: Risk for impaired skin integrity will decrease Outcome: Progressing   Problem: Tissue Perfusion: Goal: Adequacy of tissue perfusion will improve Outcome: Progressing   Problem: Education: Goal: Knowledge of General Education information will improve Description: Including pain rating scale, medication(s)/side effects and non-pharmacologic comfort measures Outcome: Progressing   Problem: Health Behavior/Discharge Planning: Goal: Ability to manage health-related needs will improve Outcome: Progressing   Problem: Clinical Measurements: Goal: Ability to maintain clinical measurements within normal limits will improve Outcome: Progressing Goal: Will remain free from infection Outcome: Progressing Goal: Diagnostic test results will improve Outcome: Progressing Goal: Respiratory complications will improve Outcome: Progressing Goal: Cardiovascular complication will  be avoided Outcome: Progressing   Problem: Activity: Goal: Risk for activity intolerance will decrease Outcome: Progressing   Problem: Nutrition: Goal: Adequate nutrition will be maintained Outcome: Progressing   Problem: Coping: Goal: Level of anxiety will decrease Outcome: Progressing   Problem: Elimination: Goal: Will not experience complications related to bowel motility Outcome: Progressing Goal: Will not experience complications related to urinary retention Outcome: Progressing   Problem: Pain Managment: Goal: General experience of comfort will improve Outcome: Progressing   Problem: Safety: Goal: Ability to remain free from injury will improve Outcome: Progressing   Problem: Skin Integrity: Goal: Risk for impaired skin integrity will decrease Outcome: Progressing   Problem: Education: Goal: Knowledge of disease or condition will improve Outcome: Progressing Goal: Knowledge of secondary prevention will improve (SELECT ALL) Outcome: Progressing Goal: Knowledge of patient specific risk factors will improve (INDIVIDUALIZE FOR PATIENT) Outcome: Progressing Goal: Individualized Educational Video(s) Outcome: Progressing   Problem: Coping: Goal: Will verbalize positive feelings about self Outcome: Progressing Goal: Will identify appropriate support needs Outcome: Progressing   Problem: Health Behavior/Discharge Planning: Goal: Ability to manage health-related needs will improve Outcome: Progressing   Problem: Self-Care: Goal: Ability to participate in self-care as condition permits will improve Outcome: Progressing Goal: Verbalization of feelings and concerns over difficulty with self-care will improve Outcome: Progressing Goal: Ability to communicate needs accurately will improve Outcome: Progressing   Problem: Nutrition: Goal: Risk of aspiration will decrease Outcome: Progressing Goal: Dietary intake will improve Outcome: Progressing   Problem:  Ischemic Stroke/TIA Tissue Perfusion: Goal: Complications of ischemic stroke/TIA will be minimized Outcome: Progressing   

## 2022-02-03 NOTE — NC FL2 (Signed)
[place] MEDICAID FL2 LEVEL OF CARE SCREENING TOOL     IDENTIFICATION  [name] Birthdate: August 07, 1940 Sex: female Admission Date (Current Location): 01/30/2022  [facility] and [place] Number:  Herbalist and Address:  [facility] [place], [place] [address] [place], [place] [address]      Provider Number: [id number]  Attending Physician Name and Address:  Little Ishikawa, MD  Relative Name and Phone Number:       Current Level of Care: Hospital Recommended Level of Care: [place] Prior Approval Number:    Date Approved/Denied:   PASRR Number: [id number]  Discharge Plan: SNF    Current Diagnoses: Patient Active Problem List   Diagnosis Date Noted   Acute CVA (cerebrovascular accident) ([place]) 01/30/2022   Seizures ([place]) 01/30/2022   UTI (urinary tract infection) 01/30/2022   Acute urinary retention 01/30/2022   ESRD (end stage renal disease) ([place]) 01/30/2022   Acute encephalopathy 01/30/2022   Thrombocytopenia ([place]), chronic 06/18/2021   Pyuria 06/13/2021   Acute on chronic diastolic CHF (congestive heart failure) ([place]) 06/12/2021   Normocytic anemia 06/12/2021   CKD (chronic kidney disease), stage V ([place]) 05/22/2021   ARF (acute renal failure) ([place]) 05/21/2021   Malnutrition of moderate degree 04/27/2021   Confusion    Wheezing    Bacteremia 04/25/2021   Sepsis with acute organ dysfunction without septic shock ([place]) 04/24/2021   Acute kidney injury superimposed on chronic kidney disease ([place]) 04/24/2021   Acute cystitis without hematuria 04/24/2021   Influenza A 09/06/2020   Persistent atrial fibrillation ([place]) 09/06/2020   DM (diabetes mellitus), type 2 with renal complications ([place]) 46/27/0350   Prolonged QT interval 09/06/2020   Abnormal gait 06/18/2020   Heart failure ([place]) 06/18/2020   Hyperglycemia due to type 2 diabetes mellitus ([place]) 06/18/2020   Hypertensive heart disease without congestive  heart failure 06/18/2020   Mixed incontinence 06/18/2020   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/18/2020   Pure hypercholesterolemia 06/18/2020   Stage 3 chronic kidney disease ([place]) 06/18/2020   Type 2 diabetes mellitus without complications ([place]) 09/38/1829   Vitamin D deficiency 06/18/2020   Essential hypertension 05/08/2019   Hyperlipidemia 05/08/2019   Bilateral lower extremity edema 05/08/2019   Failed hearing screening 02/02/2018   Impacted cerumen of right ear 02/02/2018   Otalgia, right 02/02/2018    Orientation RESPIRATION BLADDER Height & Weight     Self, Situation, Place  Normal Incontinent Weight: 60.6 kg Height:  5\' 4"  (162.6 cm)  BEHAVIORAL SYMPTOMS/MOOD NEUROLOGICAL BOWEL NUTRITION STATUS    Convulsions/Seizures (Keppra 250 mg BiD) Continent Diet (renal/ carb modified with 1200 cc Fluid restriction)  AMBULATORY STATUS COMMUNICATION OF NEEDS Skin   Extensive Assist   Surgical wounds (rt arm with incision/ wound to LLE with foam dressing)                       Personal Care Assistance Level of Assistance  Bathing, Feeding, Dressing Bathing Assistance: Maximum assistance Feeding assistance: Limited assistance Dressing Assistance: Maximum assistance     Functional Limitations Info  Sight, Hearing, Speech Sight Info: Impaired Hearing Info: Impaired Speech Info: Adequate    SPECIAL CARE FACTORS FREQUENCY  PT (By licensed PT), OT (By licensed OT)     PT Frequency: 5x/wk OT Frequency: 5x/wk            Contractures Contractures Info: Not present  Additional Factors Info  Code Status, Allergies, Insulin Sliding Scale Code Status Info: Full Allergies Info: Amlodipine   Insulin Sliding Scale Info: Novolog 0-6 units SQ three times a day       Current Medications (02/03/2022):  This is the current hospital active medication list Current Facility-Administered Medications  Medication  Dose Route Frequency Provider Last Rate Last Admin   0.9 %  sodium chloride infusion  250 mL Intravenous Continuous Agarwala, Einar Grad, MD   Stopped at 02/01/22 0440   acetaminophen (TYLENOL) tablet 650 mg  650 mg Oral Q4H PRN Kipp Brood, MD       Or   acetaminophen (TYLENOL) 160 MG/5ML solution 650 mg  650 mg Per Tube Q4H PRN Agarwala, Einar Grad, MD       Or   acetaminophen (TYLENOL) suppository 650 mg  650 mg Rectal Q4H PRN Agarwala, Einar Grad, MD       Chlorhexidine Gluconate Cloth 2 % PADS 6 each  6 each Topical Q0600 Kipp Brood, MD   6 each at 02/03/22 0602   heparin injection 5,000 Units  5,000 Units Subcutaneous Q8H Agarwala, Einar Grad, MD   5,000 Units at 02/03/22 9518   insulin aspart (novoLOG) injection 0-6 Units  0-6 Units Subcutaneous TID WC Kipp Brood, MD   1 Units at 02/02/22 1222   levETIRAcetam (KEPPRA) tablet 250 mg  250 mg Oral BID Kipp Brood, MD   250 mg at 02/03/22 0857     Discharge Medications: Please see discharge summary for a list of discharge medications.  Relevant Imaging Results:  Relevant Lab Results:   Additional Information SS#: [id number]          HD at [facility] on TTS. Pt arrives at 10:40 for 11:00 chair time  Pollie Friar, RN

## 2022-02-03 NOTE — TOC Initial Note (Addendum)
Transition of Care [facility]) - Initial/Assessment Note    Patient Details  Name: [name] MRN: [id number] Date of Birth: February 22, 1941  Transition of Care [facility]) CM/SW Contact:    Pollie Friar, RN Phone Number: 02/03/2022, 11:25 AM  Clinical Narrative:                 Pt is from home with daughter. They have been providing needed care.  Pt is ESRD and goes to HD TTS. She uses transportation services via wheelchair.  Daughter interested in SNF rehab but unsure if pt would agree. CM spoke to patient and she states she will go to SNF rehab. CM has faxed her out in the [facility] area per request.  Will provide bed offers once available. She will then need insurance approval.  TOC following.   1630: Daughter chose [facility] for rehab. After 3 messages to Tena at [facility] they will update CM in am on if they will have a bed available for her.   Expected Discharge Plan: Skilled Nursing Facility Barriers to Discharge: Continued Medical Work up, SNF Pending bed offer   Patient Goals and CMS Choice   CMS Medicare.gov Compare Post Acute Care list provided to:: Patient Choice offered to / list presented to : Patient, Adult Children  Expected Discharge Plan and Services Expected Discharge Plan: [place] In-house Referral: Clinical Social Work Discharge Planning Services: CM Consult Post Acute Care Choice: [place] arrangements for the past 2 months: [place]                                      Prior Living Arrangements/Services Living arrangements for the past 2 months: Single Family Home Lives with:: Adult Children Patient language and need for interpreter reviewed:: Yes Do you feel safe going back to the place where you live?: Yes      Need for Family Participation in Patient Care: Yes (Comment) Care giver support system in place?: Yes (comment) Current home services: DME (walker/ shower chair/ cane/  wheelchair) Criminal Activity/Legal Involvement Pertinent to Current Situation/Hospitalization: No - Comment as needed  Activities of Daily Living      Permission Sought/Granted                  Emotional Assessment Appearance:: Appears stated age Attitude/Demeanor/Rapport: Engaged Affect (typically observed): Accepting Orientation: : Oriented to Self, Oriented to Place, Oriented to Situation   Psych Involvement: No (comment)  Admission diagnosis:  Hyperkalemia [E87.5] Seizures (HCC) [R56.9] Thrombocytopenia (HCC) [D69.6] Elevated troponin I level [R79.89] Acute encephalopathy [G93.40] Acute CVA (cerebrovascular accident) ([place]) [I63.9] Urinary tract infection without hematuria, site unspecified [N39.0] Atrial fibrillation, unspecified type [facility]) [I48.91] Patient Active Problem List   Diagnosis Date Noted   Acute CVA (cerebrovascular accident) ([place]) 01/30/2022   Seizures ([place]) 01/30/2022   UTI (urinary tract infection) 01/30/2022   Acute urinary retention 01/30/2022   ESRD (end stage renal disease) ([place]) 01/30/2022   Acute encephalopathy 01/30/2022   Thrombocytopenia ([place]), chronic 06/18/2021   Pyuria 06/13/2021   Acute on chronic diastolic CHF (congestive heart failure) ([place]) 06/12/2021   Normocytic anemia 06/12/2021   CKD (chronic kidney disease), stage V ([place]) 05/22/2021   ARF (acute renal failure) ([place]) 05/21/2021   Malnutrition of moderate degree 04/27/2021   Confusion    Wheezing    Bacteremia 04/25/2021   Sepsis with acute organ dysfunction without septic  shock ([place]) 04/24/2021   Acute kidney injury superimposed on chronic kidney disease ([place]) 04/24/2021   Acute cystitis without hematuria 04/24/2021   Influenza A 09/06/2020   Persistent atrial fibrillation ([place]) 09/06/2020   DM (diabetes mellitus), type 2 with renal complications ([place]) 07/22/2246   Prolonged QT interval 09/06/2020   Abnormal gait 06/18/2020   Heart failure ([place]) 06/18/2020    Hyperglycemia due to type 2 diabetes mellitus ([place]) 06/18/2020   Hypertensive heart disease without congestive heart failure 06/18/2020   Mixed incontinence 06/18/2020   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[place]) 06/18/2020   Pure hypercholesterolemia 06/18/2020   Stage 3 chronic kidney disease ([place]) 06/18/2020   Type 2 diabetes mellitus without complications ([place]) 25/00/3704   Vitamin D deficiency 06/18/2020   Essential hypertension 05/08/2019   Hyperlipidemia 05/08/2019   Bilateral lower extremity edema 05/08/2019   Failed hearing screening 02/02/2018   Impacted cerumen of right ear 02/02/2018   Otalgia, right 02/02/2018   PCP:  Trey Sailors, PA Pharmacy:   [facility], [employer] - [place] [address] AT [facility] [address] [place] [address] Phone: [phone] Fax: [phone]  [facility] [address] [place] [address] Phone: [phone] Fax: [phone]     Social Determinants of Health (SDOH) Interventions    Readmission Risk Interventions     No data to display

## 2022-02-03 NOTE — Progress Notes (Signed)
PROGRESS NOTE    Azayla Polo  JQB:[id number] DOB: 03-May-1941 DOA: 01/30/2022 PCP: Trey Sailors, PA   Brief Narrative:  81 year old female who presented from home on 9/29 with altered mental status.  Family reported patient became unresponsive as they were putting her to bed with a fixed gaze followed by stiffening and shaking for 2-3 mins. At baseline, she is oriented x 1 and has questionable history of seizures previously thought associated with hypoglycemia. In ER, she was afebrile, normotensive, and with normal saturations.    She was more responsive, interactive and talking but then had recurrent seizure, receiving ativan and Keppra with Neurology consulting.  CTH with concern of hypoattenuation in the right frontal and right parietal lobes concerning for acute infarcts with watershed distribution. PCCM consulted for ongoing hypotension.  She required vasopressors transiently, ultimately transferred back to [facility] on 02/02/2022.  Assessment & Plan:   Principal Problem:   Acute CVA (cerebrovascular accident) ([place]) Active Problems:   DM (diabetes mellitus), type 2 with renal complications ([place])   Seizures ([place])   UTI (urinary tract infection)   Acute urinary retention   ESRD (end stage renal disease) ([facility])   Acute encephalopathy  Acute metabolic encephalopathy, cannot rule out seizure Acute CVA ruled out Baseline dementia oriented to person only Neurology following, appreciate insight recommendations -imaging work-up negative for acute CVA - Neuro recommends continuing Keppra  Hypovolemic shock:  - Initially required pressors.  Now off of pressors.  Maintaining blood pressure within normal range but low normal.   ESRD on iHD, TTS Urinary retention Hyperkalemia- mild -Continue hemodialysis as per nephrology.  Chronic diastolic CHF:  HFpEF Suspected PAH -Appears euvolemic.  Volume management by hemodialysis. -No change in prior echo.   Afib with acceptable rate  control. -Continue holding home diltiazem, blood pressure at low normal at times.   IDDM type II, un controlled at home with hyperglycemia A1c 8.2 - CBG >300 per EMS -Improved CBG while in hospital.  -Continue current insulin regimen which is only SSI.   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 -Follow intermittent CBC  DVT prophylaxis: heparin injection 5,000 Units Start: 02/01/22 1400 SCD's Start: 01/30/22 0543   Code Status: Full Code  Family Communication:  None present at bedside.  Status is: Inpatient Dispo: SNF  Estimated body mass index is 22.93 kg/m as calculated from the following:   Height as of this encounter: 5\' 4"  (1.626 m).   Weight as of this encounter: 60.6 kg.    Nutritional Assessment: Body mass index is 22.93 kg/m.Marland Kitchen  Consultants:  Neuro   Procedures:  None  Antimicrobials:  Anti-infectives (From admission, onward)    Start     Dose/Rate Route Frequency Ordered Stop   01/31/22 0330  cefTRIAXone (ROCEPHIN) 1 g in sodium chloride 0.9 % 100 mL IVPB  Status:  Discontinued        1 g 200 mL/hr over 30 Minutes Intravenous Every 24 hours 01/30/22 0546 02/01/22 0844   01/30/22 0330  cefTRIAXone (ROCEPHIN) 1 g in sodium chloride 0.9 % 100 mL IVPB        1 g 200 mL/hr over 30 Minutes Intravenous  Once 01/30/22 0324 01/30/22 0525         Subjective: Patient seen and examined.  She is alert and oriented x2.  She has no complaints.  She says she is trying to eat her breakfast.  Objective: Vitals:   02/02/22 2132 02/03/22 0107 02/03/22 0333 02/03/22 0700  BP: (!) 126/51 108/73 (!) 113/92 (!) 104/58  Pulse: 91 97 89 95  Resp: 17 14 18 15   Temp: 97.7 F (36.5 C) 98.6 F (37 C) 98.4 F (36.9 C) 98.5 F (36.9 C)  TempSrc: Oral Oral Oral Oral  SpO2: 100% 94% 100% 100%  Weight:      Height:        Intake/Output Summary (Last 24 hours) at 02/03/2022 0756 Last data filed at 02/02/2022 2300 Gross per 24  hour  Intake 150 ml  Output 2000 ml  Net -1850 ml    Filed Weights   01/30/22 1400 02/02/22 1540 02/02/22 1933  Weight: 60.2 kg 62.4 kg 60.6 kg    Examination:  General exam: Appears calm and comfortable  Respiratory system: Clear to auscultation. Respiratory effort normal. Cardiovascular system: S1 & S2 heard, RRR. No JVD, murmurs, rubs, gallops or clicks. No pedal edema. Gastrointestinal system: Abdomen is nondistended, soft and nontender. No organomegaly or masses felt. Normal bowel sounds heard. Central nervous system: Alert and oriented x2. No focal neurological deficits. Extremities: Symmetric 5 x 5 power. Skin: No rashes, lesions or ulcers  Data Reviewed: I have personally reviewed following labs and imaging studies  CBC: Recent Labs  Lab 01/29/22 0741 01/30/22 0032 01/30/22 0714 02/01/22 0235  WBC  --  4.5 4.4 6.3  NEUTROABS  --  3.3  --   --   HGB 13.9 11.8* 9.9* 11.1*  HCT 41.0 37.5 30.4* 35.5*  MCV  --  94.5 92.4 93.7  PLT  --  78* 76* 85*    Basic Metabolic Panel: Recent Labs  Lab 01/29/22 0741 01/30/22 0032 01/30/22 0714 01/31/22 1203 02/01/22 0235  NA 139 137 136 138 138  K 4.2 5.6* 4.8 4.5 5.5*  CL 97* 99 101 103 103  CO2  --  22 21* 21* 21*  GLUCOSE 126* 287* 229* 132* 152*  BUN 17 24* 28* 35* 40*  CREATININE 2.00* 2.87* 3.06* 3.61* 3.63*  CALCIUM  --  9.5 8.7* 8.5* 8.8*  MG  --   --   --   --  1.9  PHOS  --   --   --  4.7* 4.7*    GFR: Estimated Creatinine Clearance: 10.7 mL/min (A) (by C-G formula based on SCr of 3.63 mg/dL (H)). Liver Function Tests: Recent Labs  Lab 01/30/22 0032 01/31/22 1203  AST 23  --   ALT 10  --   ALKPHOS 94  --   BILITOT 1.5*  --   PROT 7.5  --   ALBUMIN 3.6 3.0*    No results for input(s): "LIPASE", "AMYLASE" in the last 168 hours. No results for input(s): "AMMONIA" in the last 168 hours. Coagulation Profile: No results for input(s): "INR", "PROTIME" in the last 168 hours. Cardiac Enzymes: No  results for input(s): "CKTOTAL", "CKMB", "CKMBINDEX", "TROPONINI" in the last 168 hours. BNP (last 3 results) Recent Labs    05/12/21 1311 05/20/21 1159  PROBNP 6,865* 10,030*    HbA1C: No results for input(s): "HGBA1C" in the last 72 hours. CBG: Recent Labs  Lab 02/02/22 0301 02/02/22 0748 02/02/22 1130 02/02/22 2140 02/03/22 0330  GLUCAP 89 165* 200* 170* 183*    Lipid Profile: No results for input(s): "CHOL", "HDL", "LDLCALC", "TRIG", "CHOLHDL", "LDLDIRECT" in the last 72 hours.  Thyroid Function Tests: No results for input(s): "TSH", "T4TOTAL", "FREET4", "T3FREE", "THYROIDAB" in the last 72 hours. Anemia Panel: No results for input(s): "VITAMINB12", "FOLATE", "FERRITIN", "TIBC", "IRON", "RETICCTPCT" in the last  72 hours. Sepsis Labs: Recent Labs  Lab 01/30/22 1037 01/30/22 1502  LATICACIDVEN 3.0* 2.4*     Recent Results (from the past 240 hour(s))  Urine Culture     Status: Abnormal   Collection Time: 01/30/22  2:33 AM   Specimen: Urine, Clean Catch  Result Value Ref Range Status   Specimen Description URINE, CLEAN CATCH  Final   Special Requests   Final    NONE Performed at [facility], [place] [address] [place], [place] [address]    Culture >=100,000 COLONIES/mL ENTEROCOCCUS FAECALIS (A)  Final   Report Status 02/02/2022 FINAL  Final   Organism ID, Bacteria ENTEROCOCCUS FAECALIS (A)  Final      Susceptibility   Enterococcus faecalis - MIC*    AMPICILLIN <=2 SENSITIVE Sensitive     NITROFURANTOIN <=16 SENSITIVE Sensitive     VANCOMYCIN 1 SENSITIVE Sensitive     * >=100,000 COLONIES/mL ENTEROCOCCUS FAECALIS  MRSA Next Gen by PCR, Nasal     Status: None   Collection Time: 01/30/22  2:22 PM   Specimen: Nasal Mucosa; Nasal Swab  Result Value Ref Range Status   MRSA by PCR Next Gen NOT DETECTED NOT DETECTED Final    Comment: (NOTE) The GeneXpert MRSA Assay (FDA approved for NASAL specimens only), is one component of a comprehensive MRSA  colonization surveillance program. It is not intended to diagnose MRSA infection nor to guide or monitor treatment for MRSA infections. Test performance is not FDA approved in patients less than 22 years old. Performed at [facility], [place] [address], [place], [place] [address]      Radiology Studies: MR BRAIN WO CONTRAST  Result Date: 02/01/2022 CLINICAL DATA:  Seizures. Found unresponsive with fixed gaze, and stiffness. EXAM: MRI HEAD WITHOUT CONTRAST MRA HEAD WITHOUT CONTRAST MRA NECK WITHOUT CONTRAST TECHNIQUE: Multiplanar, multiecho pulse sequences of the brain and surrounding structures were obtained without intravenous contrast. Angiographic images of the Circle of Willis were obtained using MRA technique without intravenous contrast. Angiographic images of the neck were obtained using MRA technique without intravenous contrast. Carotid stenosis measurements (when applicable) are obtained utilizing NASCET criteria, using the distal internal carotid diameter as the denominator. COMPARISON:  CT head 01/30/2022 FINDINGS: MRI HEAD FINDINGS Brain: There is no acute intracranial hemorrhage, extra-axial fluid collection, or acute infarct. There is moderate background parenchymal volume loss with prominence of the ventricular system and extra-axial CSF spaces. There is extensive confluent FLAIR signal abnormality throughout the supratentorial white matter which is nonspecific but likely reflects sequela of advanced chronic small vessel ischemic change. There are superimposed remote cortical infarcts in the right frontal and parietal lobes and left cerebellar hemisphere with encephalomalacia and gliosis. T1 hyperintensity and susceptibility overlying the remote right frontal infarct likely reflects cortical laminar necrosis and/or small amount of chronic blood products. There is no solid mass lesion. There is no mass effect or midline shift. Vascular: Normal flow voids. Skull and upper cervical  spine: Definite marrow signal abnormality; however, the sagittal T1 sequence is essentially nondiagnostic. Sinuses/Orbits: The imaged paranasal sinuses are clear. Bilateral lens implants are in place. The globes and orbits are otherwise unremarkable. Other: None. MRA HEAD FINDINGS Anterior circulation: The intracranial ICAs are patent without evidence of hemodynamically significant stenosis or occlusion. The right M1 segment is patent. There is attenuated signal in the proximal M2 branches likely reflecting atherosclerotic narrowing. The distal branches are perfused. There is no proximal occlusion. The left M1 segment and distal branches appear  patent without proximal stenosis or occlusion. The bilateral ACAs are patent without proximal stenosis or occlusion. There is no aneurysm or AVM. Posterior circulation: The bilateral V4 segments appear patent though evaluation is degraded by artifact. The V4 segment flow voids are present on the T2 sequence. There is no convincing evidence of high-grade stenosis or occlusion. The basilar artery is patent. Bilateral posterior communicating arteries are seen. The right PCA is severely stenotic or occluded at the P1/P2 junction with reconstitution of flow distally. There is moderate irregularity of the proximal left PCA without evidence of occlusion. There is normal flow related enhancement distally. There is no aneurysm or AVM. Anatomic variants: None MRA NECK FINDINGS The MRA images are markedly degraded by artifact. Aortic arch: Grossly unremarkable. Right carotid system: There is antegrade flow in the right carotid system. There is flow related enhancement within the common and internal carotid arteries to the level of the skull base. There is significant artifact of the bifurcation precluding estimation of atherosclerotic stenosis. Left carotid system: The left common carotid artery demonstrates flow related enhancement. There is absent flow related enhancement in the  internal carotid artery which may be in part artifactual as the imaged portions of the upper cervical internal carotid artery and intracranial ICA demonstrate flow related enhancement on the time-of-flight intracranial MRA. Vertebral arteries: There is antegrade flow in both vertebral arteries though the proximal and distal vertebral arteries are markedly suboptimally assessed due to artifact. Other: There is a right pleural effusion. IMPRESSION: 1. No acute intracranial pathology. 2. 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 3. Diminished flow related signal in multiple proximal right M2 branches likely reflecting atherosclerotic narrowing with normal enhancement in the more distal vessels. 4. Occluded or highly stenotic right PCA at the P1/P2 junction with distal reconstitution. Moderate irregularity and narrowing of the proximal left PCA with distal reconstitution. 5. Markedly degraded evaluation of the vasculature of the neck due to artifact on this noncontrast time-of-flight MRA. 6. Absent flow related enhancement in the left ICA in the neck may be in part artifactual. Otherwise there is antegrade flow in the right carotid system and bilateral vertebral arteries; estimation of atherosclerotic narrowing is precluded due to the degree of artifact. Consider CTA of the neck for better evaluation given less susceptibility to motion artifact if indicated. 7. Right pleural effusion. Electronically Signed   By: Valetta Mole M.D.   On: 02/01/2022 14:52   MR ANGIO HEAD WO CONTRAST  Result Date: 02/01/2022 CLINICAL DATA:  Seizures. Found unresponsive with fixed gaze, and stiffness. EXAM: MRI HEAD WITHOUT CONTRAST MRA HEAD WITHOUT CONTRAST MRA NECK WITHOUT CONTRAST TECHNIQUE: Multiplanar, multiecho pulse sequences of the brain and surrounding structures were obtained without intravenous contrast.  Angiographic images of the Circle of Willis were obtained using MRA technique without intravenous contrast. Angiographic images of the neck were obtained using MRA technique without intravenous contrast. Carotid stenosis measurements (when applicable) are obtained utilizing NASCET criteria, using the distal internal carotid diameter as the denominator. COMPARISON:  CT head 01/30/2022 FINDINGS: MRI HEAD FINDINGS Brain: There is no acute intracranial hemorrhage, extra-axial fluid collection, or acute infarct. There is moderate background parenchymal volume loss with prominence of the ventricular system and extra-axial CSF spaces. There is extensive confluent FLAIR signal abnormality throughout the supratentorial white matter which is nonspecific but likely reflects sequela of advanced chronic small vessel ischemic change. There are superimposed remote cortical infarcts in the  right frontal and parietal lobes and left cerebellar hemisphere with encephalomalacia and gliosis. T1 hyperintensity and susceptibility overlying the remote right frontal infarct likely reflects cortical laminar necrosis and/or small amount of chronic blood products. There is no solid mass lesion. There is no mass effect or midline shift. Vascular: Normal flow voids. Skull and upper cervical spine: Definite marrow signal abnormality; however, the sagittal T1 sequence is essentially nondiagnostic. Sinuses/Orbits: The imaged paranasal sinuses are clear. Bilateral lens implants are in place. The globes and orbits are otherwise unremarkable. Other: None. MRA HEAD FINDINGS Anterior circulation: The intracranial ICAs are patent without evidence of hemodynamically significant stenosis or occlusion. The right M1 segment is patent. There is attenuated signal in the proximal M2 branches likely reflecting atherosclerotic narrowing. The distal branches are perfused. There is no proximal occlusion. The left M1 segment and distal branches appear patent without  proximal stenosis or occlusion. The bilateral ACAs are patent without proximal stenosis or occlusion. There is no aneurysm or AVM. Posterior circulation: The bilateral V4 segments appear patent though evaluation is degraded by artifact. The V4 segment flow voids are present on the T2 sequence. There is no convincing evidence of high-grade stenosis or occlusion. The basilar artery is patent. Bilateral posterior communicating arteries are seen. The right PCA is severely stenotic or occluded at the P1/P2 junction with reconstitution of flow distally. There is moderate irregularity of the proximal left PCA without evidence of occlusion. There is normal flow related enhancement distally. There is no aneurysm or AVM. Anatomic variants: None MRA NECK FINDINGS The MRA images are markedly degraded by artifact. Aortic arch: Grossly unremarkable. Right carotid system: There is antegrade flow in the right carotid system. There is flow related enhancement within the common and internal carotid arteries to the level of the skull base. There is significant artifact of the bifurcation precluding estimation of atherosclerotic stenosis. Left carotid system: The left common carotid artery demonstrates flow related enhancement. There is absent flow related enhancement in the internal carotid artery which may be in part artifactual as the imaged portions of the upper cervical internal carotid artery and intracranial ICA demonstrate flow related enhancement on the time-of-flight intracranial MRA. Vertebral arteries: There is antegrade flow in both vertebral arteries though the proximal and distal vertebral arteries are markedly suboptimally assessed due to artifact. Other: There is a right pleural effusion. IMPRESSION: 1. No acute intracranial pathology. 2. 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 3. Diminished flow  related signal in multiple proximal right M2 branches likely reflecting atherosclerotic narrowing with normal enhancement in the more distal vessels. 4. Occluded or highly stenotic right PCA at the P1/P2 junction with distal reconstitution. Moderate irregularity and narrowing of the proximal left PCA with distal reconstitution. 5. Markedly degraded evaluation of the vasculature of the neck due to artifact on this noncontrast time-of-flight MRA. 6. Absent flow related enhancement in the left ICA in the neck may be in part artifactual. Otherwise there is antegrade flow in the right carotid system and bilateral vertebral arteries; estimation of atherosclerotic narrowing is precluded due to the degree of artifact. Consider CTA of the neck for better evaluation given less susceptibility to motion artifact if indicated. 7. Right pleural effusion. Electronically Signed   By: Valetta Mole M.D.   On: 02/01/2022 14:52   MR ANGIO NECK WO CONTRAST  Result Date: 02/01/2022 CLINICAL DATA:  Seizures. Found unresponsive with fixed gaze, and stiffness. EXAM: MRI  HEAD WITHOUT CONTRAST MRA HEAD WITHOUT CONTRAST MRA NECK WITHOUT CONTRAST TECHNIQUE: Multiplanar, multiecho pulse sequences of the brain and surrounding structures were obtained without intravenous contrast. Angiographic images of the Circle of Willis were obtained using MRA technique without intravenous contrast. Angiographic images of the neck were obtained using MRA technique without intravenous contrast. Carotid stenosis measurements (when applicable) are obtained utilizing NASCET criteria, using the distal internal carotid diameter as the denominator. COMPARISON:  CT head 01/30/2022 FINDINGS: MRI HEAD FINDINGS Brain: There is no acute intracranial hemorrhage, extra-axial fluid collection, or acute infarct. There is moderate background parenchymal volume loss with prominence of the ventricular system and extra-axial CSF spaces. There is extensive confluent FLAIR  signal abnormality throughout the supratentorial white matter which is nonspecific but likely reflects sequela of advanced chronic small vessel ischemic change. There are superimposed remote cortical infarcts in the right frontal and parietal lobes and left cerebellar hemisphere with encephalomalacia and gliosis. T1 hyperintensity and susceptibility overlying the remote right frontal infarct likely reflects cortical laminar necrosis and/or small amount of chronic blood products. There is no solid mass lesion. There is no mass effect or midline shift. Vascular: Normal flow voids. Skull and upper cervical spine: Definite marrow signal abnormality; however, the sagittal T1 sequence is essentially nondiagnostic. Sinuses/Orbits: The imaged paranasal sinuses are clear. Bilateral lens implants are in place. The globes and orbits are otherwise unremarkable. Other: None. MRA HEAD FINDINGS Anterior circulation: The intracranial ICAs are patent without evidence of hemodynamically significant stenosis or occlusion. The right M1 segment is patent. There is attenuated signal in the proximal M2 branches likely reflecting atherosclerotic narrowing. The distal branches are perfused. There is no proximal occlusion. The left M1 segment and distal branches appear patent without proximal stenosis or occlusion. The bilateral ACAs are patent without proximal stenosis or occlusion. There is no aneurysm or AVM. Posterior circulation: The bilateral V4 segments appear patent though evaluation is degraded by artifact. The V4 segment flow voids are present on the T2 sequence. There is no convincing evidence of high-grade stenosis or occlusion. The basilar artery is patent. Bilateral posterior communicating arteries are seen. The right PCA is severely stenotic or occluded at the P1/P2 junction with reconstitution of flow distally. There is moderate irregularity of the proximal left PCA without evidence of occlusion. There is normal flow related  enhancement distally. There is no aneurysm or AVM. Anatomic variants: None MRA NECK FINDINGS The MRA images are markedly degraded by artifact. Aortic arch: Grossly unremarkable. Right carotid system: There is antegrade flow in the right carotid system. There is flow related enhancement within the common and internal carotid arteries to the level of the skull base. There is significant artifact of the bifurcation precluding estimation of atherosclerotic stenosis. Left carotid system: The left common carotid artery demonstrates flow related enhancement. There is absent flow related enhancement in the internal carotid artery which may be in part artifactual as the imaged portions of the upper cervical internal carotid artery and intracranial ICA demonstrate flow related enhancement on the time-of-flight intracranial MRA. Vertebral arteries: There is antegrade flow in both vertebral arteries though the proximal and distal vertebral arteries are markedly suboptimally assessed due to artifact. Other: There is a right pleural effusion. IMPRESSION: 1. No acute intracranial pathology. 2. 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 3. Diminished flow related signal in multiple proximal right M2 branches likely reflecting atherosclerotic narrowing with normal enhancement in the more  distal vessels. 4. Occluded or highly stenotic right PCA at the P1/P2 junction with distal reconstitution. Moderate irregularity and narrowing of the proximal left PCA with distal reconstitution. 5. Markedly degraded evaluation of the vasculature of the neck due to artifact on this noncontrast time-of-flight MRA. 6. Absent flow related enhancement in the left ICA in the neck may be in part artifactual. Otherwise there is antegrade flow in the right carotid system and bilateral vertebral arteries; estimation of atherosclerotic  narrowing is precluded due to the degree of artifact. Consider CTA of the neck for better evaluation given less susceptibility to motion artifact if indicated. 7. Right pleural effusion. Electronically Signed   By: Valetta Mole M.D.   On: 02/01/2022 14:52    Scheduled Meds:  Chlorhexidine Gluconate Cloth  6 each Topical Q0600   heparin injection (subcutaneous)  5,000 Units Subcutaneous Q8H   heparin sodium (porcine)       insulin aspart  0-6 Units Subcutaneous TID WC   levETIRAcetam  250 mg Oral BID   Continuous Infusions:  sodium chloride Stopped (02/01/22 0440)     LOS: 4 days   Little Ishikawa, DO [facility]  02/03/2022, 7:56 AM

## 2022-02-03 NOTE — Inpatient Diabetes Management (Signed)
Inpatient Diabetes Program Recommendations  AACE/ADA: New Consensus Statement on Inpatient Glycemic Control (2015)  Target Ranges:  Prepandial:   less than 140 mg/dL      Peak postprandial:   less than 180 mg/dL (1-2 hours)      Critically ill patients:  140 - 180 mg/dL   Lab Results  Component Value Date   GLUCAP 257 (H) 02/03/2022   HGBA1C 8.2 (H) 01/30/2022    Review of Glycemic Control  Latest Reference Range & Units 02/03/22 08:22 02/03/22 11:27  Glucose-Capillary 70 - 99 mg/dL 147 (H) 257 (H)  (H): Data is abnormally high  Diabetes history: DM2 Outpatient Diabetes medications: 70/30 10 units QHS Current orders for Inpatient glycemic control: Novolog 0-6 units TID  Inpatient Diabetes Program Recommendations:    Novolog 2 units TID with meals if consumes at least 50% Add carb modified to diet  Will continue to follow while inpatient.  Thank you, Reche Dixon, MSN, [place] Diabetes Coordinator Inpatient Diabetes Program [phone] (team pager from 8a-5p)

## 2022-02-03 NOTE — Progress Notes (Signed)
[facility] Progress Note   Subjective:    Seen and examined patient at bedside. Appears comfortable. Tolerated yesterday's HD with net UF 2L. Next HD 10/5.  Objective Vitals:   02/03/22 0107 02/03/22 0333 02/03/22 0700 02/03/22 1101  BP: 108/73 (!) 113/92 (!) 104/58 (!) 101/58  Pulse: 97 89 95 (!) 107  Resp: 14 18 15 18   Temp: 98.6 F (37 C) 98.4 F (36.9 C) 98.5 F (36.9 C) 98 F (36.7 C)  TempSrc: Oral Oral Oral Oral  SpO2: 94% 100% 100% 96%  Weight:      Height:       Physical Exam General: Chronically-ill appearing; NAD Heart: S1 and S2; No MRGs Lungs: Clear anteriorly; breathing unlabored Abdomen: Soft and non-tender Extremities: No BL LE edema Dialysis Access: R AVF (+) B/T   Filed Weights   01/30/22 1400 02/02/22 1540 02/02/22 1933  Weight: 60.2 kg 62.4 kg 60.6 kg    Intake/Output Summary (Last 24 hours) at 02/03/2022 1308 Last data filed at 02/03/2022 0900 Gross per 24 hour  Intake 150 ml  Output 2000 ml  Net -1850 ml    Additional Objective Labs: Basic Metabolic Panel: Recent Labs  Lab 01/30/22 0714 01/31/22 1203 02/01/22 0235  NA 136 138 138  K 4.8 4.5 5.5*  CL 101 103 103  CO2 21* 21* 21*  GLUCOSE 229* 132* 152*  BUN 28* 35* 40*  CREATININE 3.06* 3.61* 3.63*  CALCIUM 8.7* 8.5* 8.8*  PHOS  --  4.7* 4.7*   Liver Function Tests: Recent Labs  Lab 01/30/22 0032 01/31/22 1203  AST 23  --   ALT 10  --   ALKPHOS 94  --   BILITOT 1.5*  --   PROT 7.5  --   ALBUMIN 3.6 3.0*   No results for input(s): "LIPASE", "AMYLASE" in the last 168 hours. CBC: Recent Labs  Lab 01/30/22 0032 01/30/22 0714 02/01/22 0235  WBC 4.5 4.4 6.3  NEUTROABS 3.3  --   --   HGB 11.8* 9.9* 11.1*  HCT 37.5 30.4* 35.5*  MCV 94.5 92.4 93.7  PLT 78* 76* 85*   Blood Culture    Component Value Date/Time   SDES URINE, CLEAN CATCH 01/30/2022 0233   SPECREQUEST  01/30/2022 0233    NONE Performed at [facility], [facility] [address],  [place], [place] [address]    CULT >=100,000 COLONIES/mL ENTEROCOCCUS FAECALIS (A) 01/30/2022 0233   REPTSTATUS 02/02/2022 FINAL 01/30/2022 0233    Cardiac Enzymes: No results for input(s): "CKTOTAL", "CKMB", "CKMBINDEX", "TROPONINI" in the last 168 hours. CBG: Recent Labs  Lab 02/02/22 1130 02/02/22 2140 02/03/22 0330 02/03/22 0822 02/03/22 1127  GLUCAP 200* 170* 183* 147* 257*   Iron Studies: No results for input(s): "IRON", "TIBC", "TRANSFERRIN", "FERRITIN" in the last 72 hours. Lab Results  Component Value Date   INR 1.3 (H) 04/24/2021   INR 1.1 09/06/2020   Studies/Results: MR BRAIN WO CONTRAST  Result Date: 02/01/2022 CLINICAL DATA:  Seizures. Found unresponsive with fixed gaze, and stiffness. EXAM: MRI HEAD WITHOUT CONTRAST MRA HEAD WITHOUT CONTRAST MRA NECK WITHOUT CONTRAST TECHNIQUE: Multiplanar, multiecho pulse sequences of the brain and surrounding structures were obtained without intravenous contrast. Angiographic images of the Circle of Willis were obtained using MRA technique without intravenous contrast. Angiographic images of the neck were obtained using MRA technique without intravenous contrast. Carotid stenosis measurements (when applicable) are obtained utilizing NASCET criteria, using the distal internal carotid diameter as the denominator. COMPARISON:  CT head  01/30/2022 FINDINGS: MRI HEAD FINDINGS Brain: There is no acute intracranial hemorrhage, extra-axial fluid collection, or acute infarct. There is moderate background parenchymal volume loss with prominence of the ventricular system and extra-axial CSF spaces. There is extensive confluent FLAIR signal abnormality throughout the supratentorial white matter which is nonspecific but likely reflects sequela of advanced chronic small vessel ischemic change. There are superimposed remote cortical infarcts in the right frontal and parietal lobes and left cerebellar hemisphere with encephalomalacia and gliosis. T1  hyperintensity and susceptibility overlying the remote right frontal infarct likely reflects cortical laminar necrosis and/or small amount of chronic blood products. There is no solid mass lesion. There is no mass effect or midline shift. Vascular: Normal flow voids. Skull and upper cervical spine: Definite marrow signal abnormality; however, the sagittal T1 sequence is essentially nondiagnostic. Sinuses/Orbits: The imaged paranasal sinuses are clear. Bilateral lens implants are in place. The globes and orbits are otherwise unremarkable. Other: None. MRA HEAD FINDINGS Anterior circulation: The intracranial ICAs are patent without evidence of hemodynamically significant stenosis or occlusion. The right M1 segment is patent. There is attenuated signal in the proximal M2 branches likely reflecting atherosclerotic narrowing. The distal branches are perfused. There is no proximal occlusion. The left M1 segment and distal branches appear patent without proximal stenosis or occlusion. The bilateral ACAs are patent without proximal stenosis or occlusion. There is no aneurysm or AVM. Posterior circulation: The bilateral V4 segments appear patent though evaluation is degraded by artifact. The V4 segment flow voids are present on the T2 sequence. There is no convincing evidence of high-grade stenosis or occlusion. The basilar artery is patent. Bilateral posterior communicating arteries are seen. The right PCA is severely stenotic or occluded at the P1/P2 junction with reconstitution of flow distally. There is moderate irregularity of the proximal left PCA without evidence of occlusion. There is normal flow related enhancement distally. There is no aneurysm or AVM. Anatomic variants: None MRA NECK FINDINGS The MRA images are markedly degraded by artifact. Aortic arch: Grossly unremarkable. Right carotid system: There is antegrade flow in the right carotid system. There is flow related enhancement within the common and internal  carotid arteries to the level of the skull base. There is significant artifact of the bifurcation precluding estimation of atherosclerotic stenosis. Left carotid system: The left common carotid artery demonstrates flow related enhancement. There is absent flow related enhancement in the internal carotid artery which may be in part artifactual as the imaged portions of the upper cervical internal carotid artery and intracranial ICA demonstrate flow related enhancement on the time-of-flight intracranial MRA. Vertebral arteries: There is antegrade flow in both vertebral arteries though the proximal and distal vertebral arteries are markedly suboptimally assessed due to artifact. Other: There is a right pleural effusion. IMPRESSION: 1. No acute intracranial pathology. 2. 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 3. Diminished flow related signal in multiple proximal right M2 branches likely reflecting atherosclerotic narrowing with normal enhancement in the more distal vessels. 4. Occluded or highly stenotic right PCA at the P1/P2 junction with distal reconstitution. Moderate irregularity and narrowing of the proximal left PCA with distal reconstitution. 5. Markedly degraded evaluation of the vasculature of the neck due to artifact on this noncontrast time-of-flight MRA. 6. Absent flow related enhancement in the left ICA in the neck may be in part artifactual. Otherwise there is antegrade flow in the right carotid system and bilateral vertebral arteries; estimation  of atherosclerotic narrowing is precluded due to the degree of artifact. Consider CTA of the neck for better evaluation given less susceptibility to motion artifact if indicated. 7. Right pleural effusion. Electronically Signed   By: Valetta Mole M.D.   On: 02/01/2022 14:52   MR ANGIO HEAD WO CONTRAST  Result Date: 02/01/2022 CLINICAL  DATA:  Seizures. Found unresponsive with fixed gaze, and stiffness. EXAM: MRI HEAD WITHOUT CONTRAST MRA HEAD WITHOUT CONTRAST MRA NECK WITHOUT CONTRAST TECHNIQUE: Multiplanar, multiecho pulse sequences of the brain and surrounding structures were obtained without intravenous contrast. Angiographic images of the Circle of Willis were obtained using MRA technique without intravenous contrast. Angiographic images of the neck were obtained using MRA technique without intravenous contrast. Carotid stenosis measurements (when applicable) are obtained utilizing NASCET criteria, using the distal internal carotid diameter as the denominator. COMPARISON:  CT head 01/30/2022 FINDINGS: MRI HEAD FINDINGS Brain: There is no acute intracranial hemorrhage, extra-axial fluid collection, or acute infarct. There is moderate background parenchymal volume loss with prominence of the ventricular system and extra-axial CSF spaces. There is extensive confluent FLAIR signal abnormality throughout the supratentorial white matter which is nonspecific but likely reflects sequela of advanced chronic small vessel ischemic change. There are superimposed remote cortical infarcts in the right frontal and parietal lobes and left cerebellar hemisphere with encephalomalacia and gliosis. T1 hyperintensity and susceptibility overlying the remote right frontal infarct likely reflects cortical laminar necrosis and/or small amount of chronic blood products. There is no solid mass lesion. There is no mass effect or midline shift. Vascular: Normal flow voids. Skull and upper cervical spine: Definite marrow signal abnormality; however, the sagittal T1 sequence is essentially nondiagnostic. Sinuses/Orbits: The imaged paranasal sinuses are clear. Bilateral lens implants are in place. The globes and orbits are otherwise unremarkable. Other: None. MRA HEAD FINDINGS Anterior circulation: The intracranial ICAs are patent without evidence of hemodynamically  significant stenosis or occlusion. The right M1 segment is patent. There is attenuated signal in the proximal M2 branches likely reflecting atherosclerotic narrowing. The distal branches are perfused. There is no proximal occlusion. The left M1 segment and distal branches appear patent without proximal stenosis or occlusion. The bilateral ACAs are patent without proximal stenosis or occlusion. There is no aneurysm or AVM. Posterior circulation: The bilateral V4 segments appear patent though evaluation is degraded by artifact. The V4 segment flow voids are present on the T2 sequence. There is no convincing evidence of high-grade stenosis or occlusion. The basilar artery is patent. Bilateral posterior communicating arteries are seen. The right PCA is severely stenotic or occluded at the P1/P2 junction with reconstitution of flow distally. There is moderate irregularity of the proximal left PCA without evidence of occlusion. There is normal flow related enhancement distally. There is no aneurysm or AVM. Anatomic variants: None MRA NECK FINDINGS The MRA images are markedly degraded by artifact. Aortic arch: Grossly unremarkable. Right carotid system: There is antegrade flow in the right carotid system. There is flow related enhancement within the common and internal carotid arteries to the level of the skull base. There is significant artifact of the bifurcation precluding estimation of atherosclerotic stenosis. Left carotid system: The left common carotid artery demonstrates flow related enhancement. There is absent flow related enhancement in the internal carotid artery which may be in part artifactual as the imaged portions of the upper cervical internal carotid artery and intracranial ICA demonstrate flow related enhancement on the time-of-flight intracranial MRA. Vertebral arteries: There is antegrade flow in both vertebral arteries though  the proximal and distal vertebral arteries are markedly suboptimally  assessed due to artifact. Other: There is a right pleural effusion. IMPRESSION: 1. No acute intracranial pathology. 2. 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 3. Diminished flow related signal in multiple proximal right M2 branches likely reflecting atherosclerotic narrowing with normal enhancement in the more distal vessels. 4. Occluded or highly stenotic right PCA at the P1/P2 junction with distal reconstitution. Moderate irregularity and narrowing of the proximal left PCA with distal reconstitution. 5. Markedly degraded evaluation of the vasculature of the neck due to artifact on this noncontrast time-of-flight MRA. 6. Absent flow related enhancement in the left ICA in the neck may be in part artifactual. Otherwise there is antegrade flow in the right carotid system and bilateral vertebral arteries; estimation of atherosclerotic narrowing is precluded due to the degree of artifact. Consider CTA of the neck for better evaluation given less susceptibility to motion artifact if indicated. 7. Right pleural effusion. Electronically Signed   By: Valetta Mole M.D.   On: 02/01/2022 14:52   MR ANGIO NECK WO CONTRAST  Result Date: 02/01/2022 CLINICAL DATA:  Seizures. Found unresponsive with fixed gaze, and stiffness. EXAM: MRI HEAD WITHOUT CONTRAST MRA HEAD WITHOUT CONTRAST MRA NECK WITHOUT CONTRAST TECHNIQUE: Multiplanar, multiecho pulse sequences of the brain and surrounding structures were obtained without intravenous contrast. Angiographic images of the Circle of Willis were obtained using MRA technique without intravenous contrast. Angiographic images of the neck were obtained using MRA technique without intravenous contrast. Carotid stenosis measurements (when applicable) are obtained utilizing NASCET criteria, using the distal internal carotid diameter as the denominator. COMPARISON:  CT head  01/30/2022 FINDINGS: MRI HEAD FINDINGS Brain: There is no acute intracranial hemorrhage, extra-axial fluid collection, or acute infarct. There is moderate background parenchymal volume loss with prominence of the ventricular system and extra-axial CSF spaces. There is extensive confluent FLAIR signal abnormality throughout the supratentorial white matter which is nonspecific but likely reflects sequela of advanced chronic small vessel ischemic change. There are superimposed remote cortical infarcts in the right frontal and parietal lobes and left cerebellar hemisphere with encephalomalacia and gliosis. T1 hyperintensity and susceptibility overlying the remote right frontal infarct likely reflects cortical laminar necrosis and/or small amount of chronic blood products. There is no solid mass lesion. There is no mass effect or midline shift. Vascular: Normal flow voids. Skull and upper cervical spine: Definite marrow signal abnormality; however, the sagittal T1 sequence is essentially nondiagnostic. Sinuses/Orbits: The imaged paranasal sinuses are clear. Bilateral lens implants are in place. The globes and orbits are otherwise unremarkable. Other: None. MRA HEAD FINDINGS Anterior circulation: The intracranial ICAs are patent without evidence of hemodynamically significant stenosis or occlusion. The right M1 segment is patent. There is attenuated signal in the proximal M2 branches likely reflecting atherosclerotic narrowing. The distal branches are perfused. There is no proximal occlusion. The left M1 segment and distal branches appear patent without proximal stenosis or occlusion. The bilateral ACAs are patent without proximal stenosis or occlusion. There is no aneurysm or AVM. Posterior circulation: The bilateral V4 segments appear patent though evaluation is degraded by artifact. The V4 segment flow voids are present on the T2 sequence. There is no convincing evidence of high-grade stenosis or occlusion. The basilar  artery is patent. Bilateral posterior communicating arteries are seen. The right PCA is severely stenotic or occluded at the P1/P2 junction with reconstitution of flow distally. There is  moderate irregularity of the proximal left PCA without evidence of occlusion. There is normal flow related enhancement distally. There is no aneurysm or AVM. Anatomic variants: None MRA NECK FINDINGS The MRA images are markedly degraded by artifact. Aortic arch: Grossly unremarkable. Right carotid system: There is antegrade flow in the right carotid system. There is flow related enhancement within the common and internal carotid arteries to the level of the skull base. There is significant artifact of the bifurcation precluding estimation of atherosclerotic stenosis. Left carotid system: The left common carotid artery demonstrates flow related enhancement. There is absent flow related enhancement in the internal carotid artery which may be in part artifactual as the imaged portions of the upper cervical internal carotid artery and intracranial ICA demonstrate flow related enhancement on the time-of-flight intracranial MRA. Vertebral arteries: There is antegrade flow in both vertebral arteries though the proximal and distal vertebral arteries are markedly suboptimally assessed due to artifact. Other: There is a right pleural effusion. IMPRESSION: 1. No acute intracranial pathology. 2. 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 3. Diminished flow related signal in multiple proximal right M2 branches likely reflecting atherosclerotic narrowing with normal enhancement in the more distal vessels. 4. Occluded or highly stenotic right PCA at the P1/P2 junction with distal reconstitution. Moderate irregularity and narrowing of the proximal left PCA with distal reconstitution. 5. Markedly degraded evaluation of the  vasculature of the neck due to artifact on this noncontrast time-of-flight MRA. 6. Absent flow related enhancement in the left ICA in the neck may be in part artifactual. Otherwise there is antegrade flow in the right carotid system and bilateral vertebral arteries; estimation of atherosclerotic narrowing is precluded due to the degree of artifact. Consider CTA of the neck for better evaluation given less susceptibility to motion artifact if indicated. 7. Right pleural effusion. Electronically Signed   By: Valetta Mole M.D.   On: 02/01/2022 14:52    Medications:  sodium chloride Stopped (02/01/22 0440)    Chlorhexidine Gluconate Cloth  6 each Topical Q0600   heparin injection (subcutaneous)  5,000 Units Subcutaneous Q8H   insulin aspart  0-6 Units Subcutaneous TID WC   levETIRAcetam  250 mg Oral BID    Dialysis Orders: [facility] TTS   TDC  57.7kg   400/1.5   3K/2.5Ca bath  Hep none - calcitriol 0.5 ug po tiw - no esa  Assessment/Plan: AMS - possible seizures. Question acute ischemic CVA by [facility] 9/29, MRI did not show any new CVA.  Neuro is following. Hx of dementia. MS improving gradually.  Shock / hypotension - resolved, getting IV abx.  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 K+ 5.5-noted hypokalemia previously but now trending up. Switched back to a renal diet. Checking labs in AM. Next HD 10/5.  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 Hb 9-11 here, no esa needs HFpEF DM2  Terry Poet, NP [facility] 02/03/2022,1:08 PM  LOS: 4 days

## 2022-02-04 DIAGNOSIS — I639 Cerebral infarction, unspecified: Secondary | ICD-10-CM | POA: Diagnosis not present

## 2022-02-04 LAB — CBC WITH DIFFERENTIAL/PLATELET
Abs Immature Granulocytes: 0.04 10*3/uL (ref 0.00–0.07)
Basophils Absolute: 0 10*3/uL (ref 0.0–0.1)
Basophils Relative: 1 %
Eosinophils Absolute: 0.2 10*3/uL (ref 0.0–0.5)
Eosinophils Relative: 4 %
HCT: 32.6 % — ABNORMAL LOW (ref 36.0–46.0)
Hemoglobin: 10.4 g/dL — ABNORMAL LOW (ref 12.0–15.0)
Immature Granulocytes: 1 %
Lymphocytes Relative: 26 %
Lymphs Abs: 1.4 10*3/uL (ref 0.7–4.0)
MCH: 29.5 pg (ref 26.0–34.0)
MCHC: 31.9 g/dL (ref 30.0–36.0)
MCV: 92.4 fL (ref 80.0–100.0)
Monocytes Absolute: 0.8 10*3/uL (ref 0.1–1.0)
Monocytes Relative: 15 %
Neutro Abs: 3 10*3/uL (ref 1.7–7.7)
Neutrophils Relative %: 53 %
Platelets: 108 10*3/uL — ABNORMAL LOW (ref 150–400)
RBC: 3.53 MIL/uL — ABNORMAL LOW (ref 3.87–5.11)
RDW: 15.8 % — ABNORMAL HIGH (ref 11.5–15.5)
WBC: 5.5 10*3/uL (ref 4.0–10.5)
nRBC: 0 % (ref 0.0–0.2)

## 2022-02-04 LAB — RENAL FUNCTION PANEL
Albumin: 2.9 g/dL — ABNORMAL LOW (ref 3.5–5.0)
Anion gap: 9 (ref 5–15)
BUN: 24 mg/dL — ABNORMAL HIGH (ref 8–23)
CO2: 30 mmol/L (ref 22–32)
Calcium: 8.5 mg/dL — ABNORMAL LOW (ref 8.9–10.3)
Chloride: 99 mmol/L (ref 98–111)
Creatinine, Ser: 2.45 mg/dL — ABNORMAL HIGH (ref 0.44–1.00)
GFR, Estimated: 19 mL/min — ABNORMAL LOW (ref >60)
Glucose, Bld: 119 mg/dL — ABNORMAL HIGH (ref 70–99)
Phosphorus: 3 mg/dL (ref 2.5–4.6)
Potassium: 3.4 mmol/L — ABNORMAL LOW (ref 3.5–5.1)
Sodium: 138 mmol/L (ref 135–145)

## 2022-02-04 LAB — GLUCOSE, CAPILLARY
Glucose-Capillary: 120 mg/dL — ABNORMAL HIGH (ref 70–99)
Glucose-Capillary: 196 mg/dL — ABNORMAL HIGH (ref 70–99)
Glucose-Capillary: 241 mg/dL — ABNORMAL HIGH (ref 70–99)
Glucose-Capillary: 137 mg/dL — ABNORMAL HIGH (ref 70–99)

## 2022-02-04 MED ORDER — HEPARIN SODIUM (PORCINE) 1000 UNIT/ML IJ SOLN
INTRAMUSCULAR | Status: AC
  Filled 2022-02-04: qty 4

## 2022-02-04 MED ORDER — HEPARIN SODIUM (PORCINE) 1000 UNIT/ML DIALYSIS
1000.0000 [IU] | INTRAMUSCULAR | Status: DC | PRN
  Administered 2022-02-04: 1000 [IU]
  Filled 2022-02-04: qty 1

## 2022-02-04 MED ORDER — HYDROCORTISONE 1 % EX CREA
TOPICAL_CREAM | CUTANEOUS | Status: DC | PRN
  Filled 2022-02-04: qty 28

## 2022-02-04 MED ORDER — ANTICOAGULANT SODIUM CITRATE 4% (200MG/5ML) IV SOLN: 5.0000 mL | Status: DC | PRN

## 2022-02-04 MED ORDER — ALTEPLASE 2 MG IJ SOLR: 2.0000 mg | Freq: Once | INTRAMUSCULAR | Status: DC | PRN

## 2022-02-04 NOTE — Plan of Care (Signed)
  Problem: Education: Goal: Ability to describe self-care measures that may prevent or decrease complications (Diabetes Survival Skills Education) will improve Outcome: Progressing Goal: Individualized Educational Video(s) Outcome: Progressing   Problem: Coping: Goal: Ability to adjust to condition or change in health will improve Outcome: Progressing   Problem: Fluid Volume: Goal: Ability to maintain a balanced intake and output will improve Outcome: Progressing   Problem: Health Behavior/Discharge Planning: Goal: Ability to identify and utilize available resources and services will improve Outcome: Progressing Goal: Ability to manage health-related needs will improve Outcome: Progressing   Problem: Metabolic: Goal: Ability to maintain appropriate glucose levels will improve Outcome: Progressing   Problem: Nutritional: Goal: Maintenance of adequate nutrition will improve Outcome: Progressing Goal: Progress toward achieving an optimal weight will improve Outcome: Progressing   Problem: Skin Integrity: Goal: Risk for impaired skin integrity will decrease Outcome: Progressing   Problem: Tissue Perfusion: Goal: Adequacy of tissue perfusion will improve Outcome: Progressing   Problem: Education: Goal: Knowledge of General Education information will improve Description: Including pain rating scale, medication(s)/side effects and non-pharmacologic comfort measures Outcome: Progressing   Problem: Health Behavior/Discharge Planning: Goal: Ability to manage health-related needs will improve Outcome: Progressing   Problem: Clinical Measurements: Goal: Ability to maintain clinical measurements within normal limits will improve Outcome: Progressing Goal: Will remain free from infection Outcome: Progressing Goal: Diagnostic test results will improve Outcome: Progressing Goal: Respiratory complications will improve Outcome: Progressing Goal: Cardiovascular complication will  be avoided Outcome: Progressing   Problem: Activity: Goal: Risk for activity intolerance will decrease Outcome: Progressing   Problem: Nutrition: Goal: Adequate nutrition will be maintained Outcome: Progressing   Problem: Coping: Goal: Level of anxiety will decrease Outcome: Progressing   Problem: Elimination: Goal: Will not experience complications related to bowel motility Outcome: Progressing Goal: Will not experience complications related to urinary retention Outcome: Progressing   Problem: Pain Managment: Goal: General experience of comfort will improve Outcome: Progressing   Problem: Safety: Goal: Ability to remain free from injury will improve Outcome: Progressing   Problem: Skin Integrity: Goal: Risk for impaired skin integrity will decrease Outcome: Progressing   Problem: Education: Goal: Knowledge of disease or condition will improve Outcome: Progressing Goal: Knowledge of secondary prevention will improve (SELECT ALL) Outcome: Progressing Goal: Knowledge of patient specific risk factors will improve (INDIVIDUALIZE FOR PATIENT) Outcome: Progressing Goal: Individualized Educational Video(s) Outcome: Progressing   Problem: Coping: Goal: Will verbalize positive feelings about self Outcome: Progressing Goal: Will identify appropriate support needs Outcome: Progressing   Problem: Health Behavior/Discharge Planning: Goal: Ability to manage health-related needs will improve Outcome: Progressing   Problem: Self-Care: Goal: Ability to participate in self-care as condition permits will improve Outcome: Progressing Goal: Verbalization of feelings and concerns over difficulty with self-care will improve Outcome: Progressing Goal: Ability to communicate needs accurately will improve Outcome: Progressing   Problem: Nutrition: Goal: Risk of aspiration will decrease Outcome: Progressing Goal: Dietary intake will improve Outcome: Progressing   Problem:  Ischemic Stroke/TIA Tissue Perfusion: Goal: Complications of ischemic stroke/TIA will be minimized Outcome: Progressing   

## 2022-02-04 NOTE — Progress Notes (Signed)
Pt for snf placement at [facility] per RN CM. Provided pt's clinic and schedule information to make sure snf can accommodate. Will assist as needed.   Melven Sartorius Renal Navigator [phone]

## 2022-02-04 NOTE — Progress Notes (Signed)
As per ward clerk pt persistently coughing while eating her dinner, and the food tray was removed

## 2022-02-04 NOTE — Progress Notes (Signed)
   02/04/22 2051  Vitals  Temp 98.3 F (36.8 C)  Temp Source Oral  BP 117/74  MAP (mmHg) 86  BP Location Left Arm  BP Method Automatic  Pulse Rate 88  Pulse Rate Source Monitor  ECG Heart Rate 84  Resp 13  Post Treatment  Dialyzer Clearance Lightly streaked  Duration of HD Treatment -hour(s) 3.5 hour(s)  Liters Processed 84  Fluid Removed 2000 mL  Tolerated HD Treatment Yes  Post-Hemodialysis Comments HD goal met   TX fin. W/o difficulty.

## 2022-02-04 NOTE — Progress Notes (Signed)
Pt kept on asking for food tray, pt is on renal diet, advised to eat soft food for today, but pt refused and asked to cal the daughter, called and spoke with Ms Lynelle Smoke, pt daughter on the phone and informed the event happened, also informed that pt is eating ice cream right now, no further inquiry was made

## 2022-02-04 NOTE — TOC Progression Note (Signed)
Transition of Care [facility]) - Progression Note    Patient Details  Name: Emmalie Haigh MRN: [id number] Date of Birth: 08-21-1940  [facility] CM/SW Contact  Pollie Friar, RN Phone Number: 02/04/2022, 10:34 AM  Clinical Narrative:    [facility]. CM has asked the MOA to begin insurance authorization for SNF rehab.  CM has [employer] SW with HD to update her on the d/c plan.  TOC following.   Expected Discharge Plan: [place] Barriers to Discharge: Continued Medical Work up, SNF Pending bed offer  Expected Discharge Plan and Services Expected Discharge Plan: [place] In-house Referral: Clinical Social Work Discharge Planning Services: CM Consult Post Acute Care Choice: [place] arrangements for the past 2 months: Single Family Home                                       Social Determinants of Health (SDOH) Interventions    Readmission Risk Interventions     No data to display

## 2022-02-04 NOTE — Progress Notes (Signed)
[facility] Progress Note   Subjective:    Seen and examined patient at bedside. Seen sitting up in recliner. No acute complaints/concerns. Plan for HD today.  Objective Vitals:   02/03/22 1930 02/03/22 2358 02/04/22 0400 02/04/22 0849  BP: 120/68 119/81 (!) 97/53 112/66  Pulse: 78 83 77 81  Resp: 18 18 18 18   Temp: 98.2 F (36.8 C) 98.7 F (37.1 C) (!) 97.5 F (36.4 C) 98.2 F (36.8 C)  TempSrc: Axillary Axillary Axillary Oral  SpO2: 100% 100% 100% 100%  Weight:      Height:       Physical Exam General: Chronically-ill appearing; awake/alert; NAD Heart: S1 and S2; No MRGs Lungs: Clear anteriorly; breathing unlabored Abdomen: Soft and non-tender Extremities: No BL LE edema Dialysis Access: R AVF (+) B/T   Filed Weights   01/30/22 1400 02/02/22 1540 02/02/22 1933  Weight: 60.2 kg 62.4 kg 60.6 kg    Intake/Output Summary (Last 24 hours) at 02/04/2022 1407 Last data filed at 02/04/2022 0400 Gross per 24 hour  Intake 160 ml  Output --  Net 160 ml    Additional Objective Labs: Basic Metabolic Panel: Recent Labs  Lab 01/31/22 1203 02/01/22 0235 02/04/22 0459  NA 138 138 138  K 4.5 5.5* 3.4*  CL 103 103 99  CO2 21* 21* 30  GLUCOSE 132* 152* 119*  BUN 35* 40* 24*  CREATININE 3.61* 3.63* 2.45*  CALCIUM 8.5* 8.8* 8.5*  PHOS 4.7* 4.7* 3.0   Liver Function Tests: Recent Labs  Lab 01/30/22 0032 01/31/22 1203 02/04/22 0459  AST 23  --   --   ALT 10  --   --   ALKPHOS 94  --   --   BILITOT 1.5*  --   --   PROT 7.5  --   --   ALBUMIN 3.6 3.0* 2.9*   No results for input(s): "LIPASE", "AMYLASE" in the last 168 hours. CBC: Recent Labs  Lab 01/30/22 0032 01/30/22 0714 02/01/22 0235 02/04/22 0459  WBC 4.5 4.4 6.3 5.5  NEUTROABS 3.3  --   --  3.0  HGB 11.8* 9.9* 11.1* 10.4*  HCT 37.5 30.4* 35.5* 32.6*  MCV 94.5 92.4 93.7 92.4  PLT 78* 76* 85* 108*   Blood Culture    Component Value Date/Time   SDES URINE, CLEAN CATCH 01/30/2022  0233   SPECREQUEST  01/30/2022 0233    NONE Performed at [facility], [place] [address], [place], [place] [address]    CULT >=100,000 COLONIES/mL ENTEROCOCCUS FAECALIS (A) 01/30/2022 0233   REPTSTATUS 02/02/2022 FINAL 01/30/2022 0233    Cardiac Enzymes: No results for input(s): "CKTOTAL", "CKMB", "CKMBINDEX", "TROPONINI" in the last 168 hours. CBG: Recent Labs  Lab 02/03/22 1127 02/03/22 1625 02/03/22 2122 02/04/22 0620 02/04/22 1131  GLUCAP 257* 122* 200* 120* 196*   Iron Studies: No results for input(s): "IRON", "TIBC", "TRANSFERRIN", "FERRITIN" in the last 72 hours. Lab Results  Component Value Date   INR 1.3 (H) 04/24/2021   INR 1.1 09/06/2020   Studies/Results: No results found.  Medications:  sodium chloride Stopped (02/01/22 0440)    Chlorhexidine Gluconate Cloth  6 each Topical Q0600   heparin injection (subcutaneous)  5,000 Units Subcutaneous Q8H   insulin aspart  0-6 Units Subcutaneous TID WC   levETIRAcetam  250 mg Oral BID    Dialysis Orders: [facility] TTS   TDC  57.7kg   400/1.5   3K/2.5Ca bath  Hep none - calcitriol 0.5 ug  po tiw - no esa  Assessment/Plan: AMS - possible seizures. Question acute ischemic CVA by [facility] 9/29, MRI did not show any new CVA.  Neuro is following. Hx of dementia. MS improving.  Shock / hypotension - resolved, getting IV abx.  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 Switched back to a renal diet since K+ trending up. K+ now 3.4. Plan for 3K bath with HD. Next HD today. Follow K+ trend closely. HD access - SP RUE AVF 9/29 BP/volume - occasional soft Bps. Euvolemic on exam.  H/o AFib Anemia esrd - not on esa at op unit. Hb 9-11 here, no esa needs HFpEF DM2    Terry Poet, NP [facility] 02/04/2022,2:07 PM  LOS: 5 days

## 2022-02-04 NOTE — Progress Notes (Signed)
PROGRESS NOTE    [name]  KVQ:[id number] DOB: 1940/11/16 DOA: 01/30/2022 PCP: Trey Sailors, PA   Brief Narrative:  81 year old female who presented from home on 9/29 with altered mental status.  Family reported patient became unresponsive as they were putting her to bed with a fixed gaze followed by stiffening and shaking for 2-3 mins. At baseline, she is oriented x 1 and has questionable history of seizures previously thought associated with hypoglycemia. In ER, she was afebrile, normotensive, and with normal saturations.    She was more responsive, interactive and talking but then had recurrent seizure, receiving ativan and Keppra with Neurology consulting.  CTH with concern of hypoattenuation in the right frontal and right parietal lobes concerning for acute infarcts with watershed distribution. PCCM consulted for ongoing hypotension.  She required vasopressors transiently, ultimately transferred back to [facility] on 02/02/2022.  Patient remains medically stable for discharge, awaiting safe disposition to SNF, once bed has been secured and insurance has approved will discharge patient to appropriate facility.  Family in agreement with current plan of disposition.  Assessment & Plan:   Principal Problem:   Acute CVA (cerebrovascular accident) ([place]) Active Problems:   DM (diabetes mellitus), type 2 with renal complications ([place])   Seizures ([place])   UTI (urinary tract infection)   Acute urinary retention   ESRD (end stage renal disease) ([facility])   Acute encephalopathy  Acute metabolic encephalopathy, cannot rule out seizure Acute CVA ruled out Baseline dementia oriented to person only Neurology following, appreciate insight recommendations -imaging work-up negative for acute CVA - Neuro recommends continuing Keppra  Hypovolemic shock:  - Initially required pressors.  Now off of pressors.  Maintaining blood pressure within normal range but low normal.   ESRD on iHD, TTS Urinary  retention Hyperkalemia- mild -Continue hemodialysis as per nephrology.  Chronic diastolic CHF:  HFpEF Suspected PAH -Appears euvolemic.  Volume management by hemodialysis. -No change in prior echo.   Afib with acceptable rate control. -Continue holding home diltiazem, blood pressure at low normal at times.   IDDM type II, un controlled at home with hyperglycemia A1c 8.2 - CBG >300 per EMS -Improved CBG while in hospital.  -Continue current insulin regimen which is only SSI.   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 -Follow intermittent CBC  DVT prophylaxis: heparin injection 5,000 Units Start: 02/01/22 1400 SCD's Start: 01/30/22 0543   Code Status: Full Code  Family Communication:  None present at bedside.  Status is: Inpatient Dispo: SNF  Estimated body mass index is 22.93 kg/m as calculated from the following:   Height as of this encounter: 5\' 4"  (1.626 m).   Weight as of this encounter: 60.6 kg.    Nutritional Assessment: Body mass index is 22.93 kg/m.Marland Kitchen  Consultants:  Neuro   Procedures:  None  Antimicrobials:  Anti-infectives (From admission, onward)    Start     Dose/Rate Route Frequency Ordered Stop   01/31/22 0330  cefTRIAXone (ROCEPHIN) 1 g in sodium chloride 0.9 % 100 mL IVPB  Status:  Discontinued        1 g 200 mL/hr over 30 Minutes Intravenous Every 24 hours 01/30/22 0546 02/01/22 0844   01/30/22 0330  cefTRIAXone (ROCEPHIN) 1 g in sodium chloride 0.9 % 100 mL IVPB        1 g 200 mL/hr over 30 Minutes Intravenous  Once 01/30/22 0324 01/30/22 0525         Subjective: Patient seen and examined.  She is alert and oriented x2.  She has no complaints.  She says she is trying to eat her breakfast.  Objective: Vitals:   02/03/22 1605 02/03/22 1930 02/03/22 2358 02/04/22 0400  BP: 102/65 120/68 119/81 (!) 97/53  Pulse: 88 78 83 77  Resp: 17 18 18 18   Temp:  98.2 F (36.8 C) 98.7 F (37.1 C) (!)  97.5 F (36.4 C)  TempSrc:  Axillary Axillary Axillary  SpO2: 100% 100% 100% 100%  Weight:      Height:        Intake/Output Summary (Last 24 hours) at 02/04/2022 0802 Last data filed at 02/04/2022 0400 Gross per 24 hour  Intake 160 ml  Output 0 ml  Net 160 ml    Filed Weights   01/30/22 1400 02/02/22 1540 02/02/22 1933  Weight: 60.2 kg 62.4 kg 60.6 kg    Examination:  General exam: Appears calm and comfortable  Respiratory system: Clear to auscultation. Respiratory effort normal. Cardiovascular system: S1 & S2 heard, RRR. No JVD, murmurs, rubs, gallops or clicks. No pedal edema. Gastrointestinal system: Abdomen is nondistended, soft and nontender. No organomegaly or masses felt. Normal bowel sounds heard. Central nervous system: Alert and oriented x2. No focal neurological deficits. Extremities: Symmetric 5 x 5 power. Skin: No rashes, lesions or ulcers  Data Reviewed: I have personally reviewed following labs and imaging studies  CBC: Recent Labs  Lab 01/29/22 0741 01/30/22 0032 01/30/22 0714 02/01/22 0235 02/04/22 0459  WBC  --  4.5 4.4 6.3 5.5  NEUTROABS  --  3.3  --   --  3.0  HGB 13.9 11.8* 9.9* 11.1* 10.4*  HCT 41.0 37.5 30.4* 35.5* 32.6*  MCV  --  94.5 92.4 93.7 92.4  PLT  --  78* 76* 85* 108*    Basic Metabolic Panel: Recent Labs  Lab 01/30/22 0032 01/30/22 0714 01/31/22 1203 02/01/22 0235 02/04/22 0459  NA 137 136 138 138 138  K 5.6* 4.8 4.5 5.5* 3.4*  CL 99 101 103 103 99  CO2 22 21* 21* 21* 30  GLUCOSE 287* 229* 132* 152* 119*  BUN 24* 28* 35* 40* 24*  CREATININE 2.87* 3.06* 3.61* 3.63* 2.45*  CALCIUM 9.5 8.7* 8.5* 8.8* 8.5*  MG  --   --   --  1.9  --   PHOS  --   --  4.7* 4.7* 3.0    GFR: Estimated Creatinine Clearance: 15.8 mL/min (A) (by C-G formula based on SCr of 2.45 mg/dL (H)). Liver Function Tests: Recent Labs  Lab 01/30/22 0032 01/31/22 1203 02/04/22 0459  AST 23  --   --   ALT 10  --   --   ALKPHOS 94  --   --    BILITOT 1.5*  --   --   PROT 7.5  --   --   ALBUMIN 3.6 3.0* 2.9*    No results for input(s): "LIPASE", "AMYLASE" in the last 168 hours. No results for input(s): "AMMONIA" in the last 168 hours. Coagulation Profile: No results for input(s): "INR", "PROTIME" in the last 168 hours. Cardiac Enzymes: No results for input(s): "CKTOTAL", "CKMB", "CKMBINDEX", "TROPONINI" in the last 168 hours. BNP (last 3 results) Recent Labs    05/12/21 1311 05/20/21 1159  PROBNP 6,865* 10,030*    HbA1C: No results for input(s): "HGBA1C" in the last 72 hours. CBG: Recent Labs  Lab 02/03/22 0822 02/03/22 1127 02/03/22 1625 02/03/22 2122 02/04/22 0620  GLUCAP 147* 257* 122* 200* 120*  Lipid Profile: No results for input(s): "CHOL", "HDL", "LDLCALC", "TRIG", "CHOLHDL", "LDLDIRECT" in the last 72 hours.  Thyroid Function Tests: No results for input(s): "TSH", "T4TOTAL", "FREET4", "T3FREE", "THYROIDAB" in the last 72 hours. Anemia Panel: No results for input(s): "VITAMINB12", "FOLATE", "FERRITIN", "TIBC", "IRON", "RETICCTPCT" in the last 72 hours. Sepsis Labs: Recent Labs  Lab 01/30/22 1037 01/30/22 1502  LATICACIDVEN 3.0* 2.4*     Recent Results (from the past 240 hour(s))  Urine Culture     Status: Abnormal   Collection Time: 01/30/22  2:33 AM   Specimen: Urine, Clean Catch  Result Value Ref Range Status   Specimen Description URINE, CLEAN CATCH  Final   Special Requests   Final    NONE Performed at [facility], [place] [address] [place], [place] [address]    Culture >=100,000 COLONIES/mL ENTEROCOCCUS FAECALIS (A)  Final   Report Status 02/02/2022 FINAL  Final   Organism ID, Bacteria ENTEROCOCCUS FAECALIS (A)  Final      Susceptibility   Enterococcus faecalis - MIC*    AMPICILLIN <=2 SENSITIVE Sensitive     NITROFURANTOIN <=16 SENSITIVE Sensitive     VANCOMYCIN 1 SENSITIVE Sensitive     * >=100,000 COLONIES/mL ENTEROCOCCUS FAECALIS  MRSA Next Gen by PCR, Nasal      Status: None   Collection Time: 01/30/22  2:22 PM   Specimen: Nasal Mucosa; Nasal Swab  Result Value Ref Range Status   MRSA by PCR Next Gen NOT DETECTED NOT DETECTED Final    Comment: (NOTE) The GeneXpert MRSA Assay (FDA approved for NASAL specimens only), is one component of a comprehensive MRSA colonization surveillance program. It is not intended to diagnose MRSA infection nor to guide or monitor treatment for MRSA infections. Test performance is not FDA approved in patients less than 84 years old. Performed at [facility], [place] [address], [place], [place] [address]      Radiology Studies: No results found.  Scheduled Meds:  Chlorhexidine Gluconate Cloth  6 each Topical Q0600   heparin injection (subcutaneous)  5,000 Units Subcutaneous Q8H   insulin aspart  0-6 Units Subcutaneous TID WC   levETIRAcetam  250 mg Oral BID   Continuous Infusions:  sodium chloride Stopped (02/01/22 0440)     LOS: 5 days   Little Ishikawa, DO [facility]  02/04/2022, 8:02 AM

## 2022-02-04 NOTE — Progress Notes (Signed)
Pt came from HD, alert oriented not in tele, on room air, advised to rest

## 2022-02-04 NOTE — Progress Notes (Signed)
Physical Therapy Treatment Patient Details Name: [name] MRN: [id number] DOB: December 16, 1940 Today's Date: 02/04/2022   History of Present Illness 81 year old female who presented with seizures; family was helping her to bed and she became unresponsive with a fixed gaze, stiffened, and shook for 2 to 3 minutes. Work up pending. Pt with history of DM, PAF, HTN, ESRD on HD    PT Comments    Pt agreeable to OOB and exercise today.  Looks to be started to make quicker improvements now toward goals.  Emphasis on warm up strengthening exercise, transitions, scooting, sit to stands into the RW, progression of gait and pericare in the bathroom from toilet level.    Recommendations for follow up therapy are one component of a multi-disciplinary discharge planning process, led by the attending physician.  Recommendations may be updated based on patient status, additional functional criteria and insurance authorization.  Follow Up Recommendations  Skilled nursing-short term rehab (<3 hours/day) Can patient physically be transported by private vehicle: No   Assistance Recommended at Discharge Frequent or constant Supervision/Assistance  Patient can return home with the following A little help with walking and/or transfers;A little help with bathing/dressing/bathroom;Assistance with cooking/housework;Assist for transportation;Help with stairs or ramp for entrance;Direct supervision/assist for medications management;Direct supervision/assist for financial management   Equipment Recommendations  Other (comment) (may now be progressing past reliance on w/c.  TBD)    Recommendations for Other Services       Precautions / Restrictions Precautions Precautions: Fall     Mobility  Bed Mobility Overal bed mobility: Needs Assistance Bed Mobility: Supine to Sit Rolling: Min assist         General bed mobility comments: cues for direction and use of UE's    Transfers Overall transfer level:  Needs assistance   Transfers: Sit to/from Stand Sit to Stand: Mod assist           General transfer comment: pt with posterior bias, cues for hand placement and assist to come more forward with some boost.  More assist for the lower surface.    Ambulation/Gait Ambulation/Gait assistance: Min assist, Mod assist Gait Distance (Feet): 30 Feet (then 15 back to the chair after attempt at toileting.) Assistive device: Rolling walker (2 wheels) Gait Pattern/deviations: Step-through pattern, Decreased stride length Gait velocity: decreased Gait velocity interpretation: <1.31 ft/sec, indicative of household ambulator   General Gait Details: unsteady overall with posterior bias, but improved step length and confidence once having gotten goint.   Stairs             Wheelchair Mobility    Modified Rankin (Stroke Patients Only)       Balance Overall balance assessment: Needs assistance Sitting-balance support: No upper extremity supported Sitting balance-Leahy Scale: Fair     Standing balance support: Bilateral upper extremity supported, Single extremity supported, During functional activity Standing balance-Leahy Scale: Poor Standing balance comment: pt able to wipe after toileting with mod and 1 UE on the RW                            Cognition Arousal/Alertness: Awake/alert Behavior During Therapy: [facility] for tasks assessed/performed Overall Cognitive Status: No family/caregiver present to determine baseline cognitive functioning (Likely close to baseline)  Exercises Other Exercises Other Exercises: hip/knee flex/ext A/AAROM with graded resistance x10 reps bil Other Exercises: SLR bil x10 reps AA Other Exercises: bicep/tricep presses x10 reps bil with graded resistance.    General Comments General comments (skin integrity, edema, etc.): vss      Pertinent Vitals/Pain Pain Assessment Pain  Assessment: Faces Faces Pain Scale: No hurt Pain Intervention(s): Monitored during session    Home Living                          Prior Function            PT Goals (current goals can now be found in the care plan section) Acute Rehab PT Goals PT Goal Formulation: With patient Time For Goal Achievement: 02/14/22 Potential to Achieve Goals: Good Progress towards PT goals: Progressing toward goals    Frequency    Min 3X/week      PT Plan Current plan remains appropriate    Co-evaluation              AM-PAC PT "6 Clicks" Mobility   Outcome Measure  Help needed turning from your back to your side while in a flat bed without using bedrails?: A Little Help needed moving from lying on your back to sitting on the side of a flat bed without using bedrails?: A Little Help needed moving to and from a bed to a chair (including a wheelchair)?: A Lot Help needed standing up from a chair using your arms (e.g., wheelchair or bedside chair)?: A Little Help needed to walk in hospital room?: A Lot Help needed climbing 3-5 steps with a railing? : A Lot 6 Click Score: 15    End of Session   Activity Tolerance: Patient tolerated treatment well Patient left: in chair;with call bell/phone within reach;with chair alarm set Nurse Communication: Mobility status PT Visit Diagnosis: Other abnormalities of gait and mobility (R26.89);Muscle weakness (generalized) (M62.81);Adult, failure to thrive (R62.7);Unsteadiness on feet (R26.81)     Time: 8185-9093 PT Time Calculation (min) (ACUTE ONLY): 35 min  Charges:  $Gait Training: 8-22 mins $Therapeutic Activity: 8-22 mins                     02/04/2022  Ginger Carne., PT Acute Rehabilitation Services [phone]  (office)   Tessie Fass Kourtnei Rauber 02/04/2022, 12:08 PM

## 2022-02-05 DIAGNOSIS — I639 Cerebral infarction, unspecified: Secondary | ICD-10-CM | POA: Diagnosis not present

## 2022-02-05 LAB — GLUCOSE, CAPILLARY
Glucose-Capillary: 225 mg/dL — ABNORMAL HIGH (ref 70–99)
Glucose-Capillary: 159 mg/dL — ABNORMAL HIGH (ref 70–99)

## 2022-02-05 MED ORDER — LEVETIRACETAM 250 MG PO TABS: 250.0000 mg | ORAL_TABLET | Freq: Two times a day (BID) | ORAL | 0 refills | Status: DC

## 2022-02-05 NOTE — Discharge Summary (Signed)
Physician Discharge Summary  [name] HER:[id number] DOB: 09-19-1940 DOA: 01/30/2022  PCP: Trey Sailors, PA  Admit date: 01/30/2022 Discharge date: 02/05/2022  Admitted From: Home Disposition: SNF  Recommendations for Outpatient Follow-up:  Follow up with PCP in 1-2 weeks Follow-up with neurology as scheduled  Discharge Condition: Stable CODE STATUS: full Diet recommendation: As tolerated  Brief/Interim Summary: 81 year old female who presented from home on 9/29 with altered mental status.  Family reported patient became unresponsive as they were putting her to bed with a fixed gaze followed by stiffening and shaking for 2-3 mins. At baseline, she is oriented x 1 and has questionable history of seizures previously thought associated with hypoglycemia. In ER, she was afebrile, normotensive, and with normal saturations.     She was more responsive, interactive and talking but then had recurrent seizure, receiving ativan and Keppra with Neurology consulting.  CTH with concern of hypoattenuation in the right frontal and right parietal lobes concerning for acute infarcts with watershed distribution. PCCM consulted for ongoing hypotension.  She required vasopressors transiently, ultimately transferred back to [facility] on 02/02/2022.   Patient remains medically stable for discharge, awaiting safe disposition to SNF, once bed has been secured and insurance has approved will discharge patient to appropriate facility.  Family in agreement with current plan of disposition.  Discharge Diagnoses:  Principal Problem:   Acute CVA (cerebrovascular accident) ([place]) Active Problems:   DM (diabetes mellitus), type 2 with renal complications ([place])   Seizures ([place])   UTI (urinary tract infection)   Acute urinary retention   ESRD (end stage renal disease) ([place])   Acute encephalopathy  Acute metabolic encephalopathy, suspected seizure Acute CVA ruled out Baseline dementia oriented to person  only Neurology following, appreciate insight recommendations -imaging work-up negative for acute CVA - Neuro recommends continuing Keppra   Presumed UTI, POA  Hypovolemic shock, resolved:  - Initially required pressors.  Now off of pressors.  Maintaining blood pressure within normal range but low normal.   ESRD on iHD, TTS Urinary retention Hyperkalemia- mild -Continue hemodialysis as per nephrology.   Chronic diastolic CHF:  HFpEF Suspected PAH -Appears euvolemic.  Volume management by hemodialysis. -No change in prior echo.   Afib with acceptable rate control. -Continue holding home diltiazem, blood pressure at low normal at times.   IDDM type II, un controlled at home with hyperglycemia A1c 8.2 - CBG >300 per EMS -Improved CBG while in hospital.  -Continue current insulin regimen which is only SSI.   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 -Follow intermittent CBC  Discharge Instructions  Discharge Instructions     Discharge patient   Complete by: As directed    Discharge disposition: 03-Skilled [place]   Discharge patient date: 02/05/2022      Allergies as of 02/05/2022       Reactions   Amlodipine Other (See Comments)   Patient has LE edema to this med at higher doses (10 mg)        Medication List     STOP taking these medications    diltiazem 180 MG 24 hr capsule Commonly known as: CARDIZEM CD   naproxen sodium 220 MG tablet Commonly known as: ALEVE   oxyCODONE-acetaminophen 5-325 MG tablet Commonly known as: Percocet   pravastatin 20 MG tablet Commonly known as: PRAVACHOL   sevelamer carbonate 800 MG tablet Commonly known as: RENVELA       TAKE these medications    Accu-Chek FastClix Lancets Misc  See admin instructions.   Accu-Chek Guide Me w/Device Kit See admin instructions.   BD Pen Needle Nano 2nd Gen 32G X 4 MM Misc Generic drug: Insulin Pen Needle 3 (three) times  daily. as directed   OEHOZYYQM-25 EX Apply 1 application. topically 3 (three) times daily as needed (itching).   HumuLIN 70/30 KwikPen (70-30) 100 UNIT/ML KwikPen Generic drug: insulin isophane & regular human KwikPen Inject 5 Units into the skin daily. What changed:  how much to take when to take this   levETIRAcetam 250 MG tablet Commonly known as: KEPPRA Take 1 tablet (250 mg total) by mouth 2 (two) times daily.        Contact information for after-discharge care     Destination     [facility] Preferred SNF .   Service: Skilled Nursing Contact information: [place] [address] [phone]                    Allergies  Allergen Reactions   Amlodipine Other (See Comments)    Patient has LE edema to this med at higher doses (10 mg)    Consultations: Nephrology, PCCM, neurology  Procedures/Studies: MR BRAIN WO CONTRAST  Result Date: 02/01/2022 CLINICAL DATA:  Seizures. Found unresponsive with fixed gaze, and stiffness. EXAM: MRI HEAD WITHOUT CONTRAST MRA HEAD WITHOUT CONTRAST MRA NECK WITHOUT CONTRAST TECHNIQUE: Multiplanar, multiecho pulse sequences of the brain and surrounding structures were obtained without intravenous contrast. Angiographic images of the Circle of Willis were obtained using MRA technique without intravenous contrast. Angiographic images of the neck were obtained using MRA technique without intravenous contrast. Carotid stenosis measurements (when applicable) are obtained utilizing NASCET criteria, using the distal internal carotid diameter as the denominator. COMPARISON:  CT head 01/30/2022 FINDINGS: MRI HEAD FINDINGS Brain: There is no acute intracranial hemorrhage, extra-axial fluid collection, or acute infarct. There is moderate background parenchymal volume loss with prominence of the ventricular system and extra-axial CSF spaces. There is extensive confluent FLAIR signal abnormality  throughout the supratentorial white matter which is nonspecific but likely reflects sequela of advanced chronic small vessel ischemic change. There are superimposed remote cortical infarcts in the right frontal and parietal lobes and left cerebellar hemisphere with encephalomalacia and gliosis. T1 hyperintensity and susceptibility overlying the remote right frontal infarct likely reflects cortical laminar necrosis and/or small amount of chronic blood products. There is no solid mass lesion. There is no mass effect or midline shift. Vascular: Normal flow voids. Skull and upper cervical spine: Definite marrow signal abnormality; however, the sagittal T1 sequence is essentially nondiagnostic. Sinuses/Orbits: The imaged paranasal sinuses are clear. Bilateral lens implants are in place. The globes and orbits are otherwise unremarkable. Other: None. MRA HEAD FINDINGS Anterior circulation: The intracranial ICAs are patent without evidence of hemodynamically significant stenosis or occlusion. The right M1 segment is patent. There is attenuated signal in the proximal M2 branches likely reflecting atherosclerotic narrowing. The distal branches are perfused. There is no proximal occlusion. The left M1 segment and distal branches appear patent without proximal stenosis or occlusion. The bilateral ACAs are patent without proximal stenosis or occlusion. There is no aneurysm or AVM. Posterior circulation: The bilateral V4 segments appear patent though evaluation is degraded by artifact. The V4 segment flow voids are present on the T2 sequence. There is no convincing evidence of high-grade stenosis or occlusion. The basilar artery is patent. Bilateral posterior communicating arteries are seen. The right PCA is severely stenotic or occluded at the  P1/P2 junction with reconstitution of flow distally. There is moderate irregularity of the proximal left PCA without evidence of occlusion. There is normal flow related enhancement  distally. There is no aneurysm or AVM. Anatomic variants: None MRA NECK FINDINGS The MRA images are markedly degraded by artifact. Aortic arch: Grossly unremarkable. Right carotid system: There is antegrade flow in the right carotid system. There is flow related enhancement within the common and internal carotid arteries to the level of the skull base. There is significant artifact of the bifurcation precluding estimation of atherosclerotic stenosis. Left carotid system: The left common carotid artery demonstrates flow related enhancement. There is absent flow related enhancement in the internal carotid artery which may be in part artifactual as the imaged portions of the upper cervical internal carotid artery and intracranial ICA demonstrate flow related enhancement on the time-of-flight intracranial MRA. Vertebral arteries: There is antegrade flow in both vertebral arteries though the proximal and distal vertebral arteries are markedly suboptimally assessed due to artifact. Other: There is a right pleural effusion. IMPRESSION: 1. No acute intracranial pathology. 2. 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 3. Diminished flow related signal in multiple proximal right M2 branches likely reflecting atherosclerotic narrowing with normal enhancement in the more distal vessels. 4. Occluded or highly stenotic right PCA at the P1/P2 junction with distal reconstitution. Moderate irregularity and narrowing of the proximal left PCA with distal reconstitution. 5. Markedly degraded evaluation of the vasculature of the neck due to artifact on this noncontrast time-of-flight MRA. 6. Absent flow related enhancement in the left ICA in the neck may be in part artifactual. Otherwise there is antegrade flow in the right carotid system and bilateral vertebral arteries; estimation of atherosclerotic narrowing is  precluded due to the degree of artifact. Consider CTA of the neck for better evaluation given less susceptibility to motion artifact if indicated. 7. Right pleural effusion. Electronically Signed   By: Valetta Mole M.D.   On: 02/01/2022 14:52   MR ANGIO HEAD WO CONTRAST  Result Date: 02/01/2022 CLINICAL DATA:  Seizures. Found unresponsive with fixed gaze, and stiffness. EXAM: MRI HEAD WITHOUT CONTRAST MRA HEAD WITHOUT CONTRAST MRA NECK WITHOUT CONTRAST TECHNIQUE: Multiplanar, multiecho pulse sequences of the brain and surrounding structures were obtained without intravenous contrast. Angiographic images of the Circle of Willis were obtained using MRA technique without intravenous contrast. Angiographic images of the neck were obtained using MRA technique without intravenous contrast. Carotid stenosis measurements (when applicable) are obtained utilizing NASCET criteria, using the distal internal carotid diameter as the denominator. COMPARISON:  CT head 01/30/2022 FINDINGS: MRI HEAD FINDINGS Brain: There is no acute intracranial hemorrhage, extra-axial fluid collection, or acute infarct. There is moderate background parenchymal volume loss with prominence of the ventricular system and extra-axial CSF spaces. There is extensive confluent FLAIR signal abnormality throughout the supratentorial white matter which is nonspecific but likely reflects sequela of advanced chronic small vessel ischemic change. There are superimposed remote cortical infarcts in the right frontal and parietal lobes and left cerebellar hemisphere with encephalomalacia and gliosis. T1 hyperintensity and susceptibility overlying the remote right frontal infarct likely reflects cortical laminar necrosis and/or small amount of chronic blood products. There is no solid mass lesion. There is no mass effect or midline shift. Vascular: Normal flow voids. Skull and upper cervical spine: Definite marrow signal abnormality; however, the sagittal T1  sequence is essentially nondiagnostic. Sinuses/Orbits: The imaged paranasal sinuses are clear.  Bilateral lens implants are in place. The globes and orbits are otherwise unremarkable. Other: None. MRA HEAD FINDINGS Anterior circulation: The intracranial ICAs are patent without evidence of hemodynamically significant stenosis or occlusion. The right M1 segment is patent. There is attenuated signal in the proximal M2 branches likely reflecting atherosclerotic narrowing. The distal branches are perfused. There is no proximal occlusion. The left M1 segment and distal branches appear patent without proximal stenosis or occlusion. The bilateral ACAs are patent without proximal stenosis or occlusion. There is no aneurysm or AVM. Posterior circulation: The bilateral V4 segments appear patent though evaluation is degraded by artifact. The V4 segment flow voids are present on the T2 sequence. There is no convincing evidence of high-grade stenosis or occlusion. The basilar artery is patent. Bilateral posterior communicating arteries are seen. The right PCA is severely stenotic or occluded at the P1/P2 junction with reconstitution of flow distally. There is moderate irregularity of the proximal left PCA without evidence of occlusion. There is normal flow related enhancement distally. There is no aneurysm or AVM. Anatomic variants: None MRA NECK FINDINGS The MRA images are markedly degraded by artifact. Aortic arch: Grossly unremarkable. Right carotid system: There is antegrade flow in the right carotid system. There is flow related enhancement within the common and internal carotid arteries to the level of the skull base. There is significant artifact of the bifurcation precluding estimation of atherosclerotic stenosis. Left carotid system: The left common carotid artery demonstrates flow related enhancement. There is absent flow related enhancement in the internal carotid artery which may be in part artifactual as the imaged  portions of the upper cervical internal carotid artery and intracranial ICA demonstrate flow related enhancement on the time-of-flight intracranial MRA. Vertebral arteries: There is antegrade flow in both vertebral arteries though the proximal and distal vertebral arteries are markedly suboptimally assessed due to artifact. Other: There is a right pleural effusion. IMPRESSION: 1. No acute intracranial pathology. 2. 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 3. Diminished flow related signal in multiple proximal right M2 branches likely reflecting atherosclerotic narrowing with normal enhancement in the more distal vessels. 4. Occluded or highly stenotic right PCA at the P1/P2 junction with distal reconstitution. Moderate irregularity and narrowing of the proximal left PCA with distal reconstitution. 5. Markedly degraded evaluation of the vasculature of the neck due to artifact on this noncontrast time-of-flight MRA. 6. Absent flow related enhancement in the left ICA in the neck may be in part artifactual. Otherwise there is antegrade flow in the right carotid system and bilateral vertebral arteries; estimation of atherosclerotic narrowing is precluded due to the degree of artifact. Consider CTA of the neck for better evaluation given less susceptibility to motion artifact if indicated. 7. Right pleural effusion. Electronically Signed   By: Valetta Mole M.D.   On: 02/01/2022 14:52   MR ANGIO NECK WO CONTRAST  Result Date: 02/01/2022 CLINICAL DATA:  Seizures. Found unresponsive with fixed gaze, and stiffness. EXAM: MRI HEAD WITHOUT CONTRAST MRA HEAD WITHOUT CONTRAST MRA NECK WITHOUT CONTRAST TECHNIQUE: Multiplanar, multiecho pulse sequences of the brain and surrounding structures were obtained without intravenous contrast. Angiographic images of the Circle of Willis were obtained using MRA  technique without intravenous contrast. Angiographic images of the neck were obtained using MRA technique without intravenous contrast. Carotid stenosis measurements (when applicable) are obtained utilizing NASCET criteria, using the distal internal carotid diameter as the denominator. COMPARISON:  CT  head 01/30/2022 FINDINGS: MRI HEAD FINDINGS Brain: There is no acute intracranial hemorrhage, extra-axial fluid collection, or acute infarct. There is moderate background parenchymal volume loss with prominence of the ventricular system and extra-axial CSF spaces. There is extensive confluent FLAIR signal abnormality throughout the supratentorial white matter which is nonspecific but likely reflects sequela of advanced chronic small vessel ischemic change. There are superimposed remote cortical infarcts in the right frontal and parietal lobes and left cerebellar hemisphere with encephalomalacia and gliosis. T1 hyperintensity and susceptibility overlying the remote right frontal infarct likely reflects cortical laminar necrosis and/or small amount of chronic blood products. There is no solid mass lesion. There is no mass effect or midline shift. Vascular: Normal flow voids. Skull and upper cervical spine: Definite marrow signal abnormality; however, the sagittal T1 sequence is essentially nondiagnostic. Sinuses/Orbits: The imaged paranasal sinuses are clear. Bilateral lens implants are in place. The globes and orbits are otherwise unremarkable. Other: None. MRA HEAD FINDINGS Anterior circulation: The intracranial ICAs are patent without evidence of hemodynamically significant stenosis or occlusion. The right M1 segment is patent. There is attenuated signal in the proximal M2 branches likely reflecting atherosclerotic narrowing. The distal branches are perfused. There is no proximal occlusion. The left M1 segment and distal branches appear patent without proximal stenosis or occlusion. The bilateral ACAs are patent  without proximal stenosis or occlusion. There is no aneurysm or AVM. Posterior circulation: The bilateral V4 segments appear patent though evaluation is degraded by artifact. The V4 segment flow voids are present on the T2 sequence. There is no convincing evidence of high-grade stenosis or occlusion. The basilar artery is patent. Bilateral posterior communicating arteries are seen. The right PCA is severely stenotic or occluded at the P1/P2 junction with reconstitution of flow distally. There is moderate irregularity of the proximal left PCA without evidence of occlusion. There is normal flow related enhancement distally. There is no aneurysm or AVM. Anatomic variants: None MRA NECK FINDINGS The MRA images are markedly degraded by artifact. Aortic arch: Grossly unremarkable. Right carotid system: There is antegrade flow in the right carotid system. There is flow related enhancement within the common and internal carotid arteries to the level of the skull base. There is significant artifact of the bifurcation precluding estimation of atherosclerotic stenosis. Left carotid system: The left common carotid artery demonstrates flow related enhancement. There is absent flow related enhancement in the internal carotid artery which may be in part artifactual as the imaged portions of the upper cervical internal carotid artery and intracranial ICA demonstrate flow related enhancement on the time-of-flight intracranial MRA. Vertebral arteries: There is antegrade flow in both vertebral arteries though the proximal and distal vertebral arteries are markedly suboptimally assessed due to artifact. Other: There is a right pleural effusion. IMPRESSION: 1. No acute intracranial pathology. 2. 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 3. Diminished flow related signal in multiple proximal right M2 branches likely  reflecting atherosclerotic narrowing with normal enhancement in the more distal vessels. 4. Occluded or highly stenotic right PCA at the P1/P2 junction with distal reconstitution. Moderate irregularity and narrowing of the proximal left PCA with distal reconstitution. 5. Markedly degraded evaluation of the vasculature of the neck due to artifact on this noncontrast time-of-flight MRA. 6. Absent flow related enhancement in the left ICA in the neck may be in part artifactual. Otherwise there is antegrade flow in the right carotid system and bilateral vertebral arteries;  estimation of atherosclerotic narrowing is precluded due to the degree of artifact. Consider CTA of the neck for better evaluation given less susceptibility to motion artifact if indicated. 7. Right pleural effusion. Electronically Signed   By: Lesia Hausen M.D.   On: 02/01/2022 14:52   ECHOCARDIOGRAM COMPLETE  Result Date: 01/31/2022    ECHOCARDIOGRAM REPORT   Patient Name:   JAZLIN TAPSCOTT Date of Exam: 01/30/2022 Medical Rec #:  [id number]     Height:       64.0 in Accession #:    [id number]    Weight:       132.7 lb Date of Birth:  03-Aug-1940     BSA:          1.643 m Patient Age:    81 years      BP:           106/63 mmHg Patient Gender: F             HR:           88 bpm. Exam Location:  Inpatient Procedure: 2D Echo, Cardiac Doppler and Color Doppler Indications:     Stroke  History:         Patient has prior history of Echocardiogram examinations, most                  recent 12/28/2020. Pulmonary HTN.  Sonographer:     Roosvelt Maser RDCS Referring Phys:  [id number] John Giovanni Diagnosing Phys: Truett Mainland MD IMPRESSIONS  1. Left ventricular ejection fraction, by estimation, is 60 to 65%. The left ventricle has normal function. The left ventricle has no regional wall motion abnormalities. There is moderate left ventricular hypertrophy. Left ventricular diastolic function  could not be evaluated. There is the interventricular septum is  flattened in systole, consistent with right ventricular pressure overload.  2. Right ventricular systolic function is low normal. The right ventricular size is mildly enlarged. There is moderately elevated pulmonary artery systolic pressure.  3. Right atrial size was severely dilated.  4. The mitral valve is normal in structure. Mild to moderate mitral valve regurgitation. No evidence of mitral stenosis.  5. Tricuspid valve regurgitation is severe.  6. The aortic valve is tricuspid. Aortic valve regurgitation is not visualized. Aortic valve sclerosis is present, with no evidence of aortic valve stenosis.  7. Pulmonic valve regurgitation is moderate.  8. The inferior vena cava is dilated in size with <50% respiratory variability, suggesting right atrial pressure of 15 mmHg. Comparison(s): A prior study was performed on 12/26/2020. No significant change from prior study. FINDINGS  Left Ventricle: Left ventricular ejection fraction, by estimation, is 60 to 65%. The left ventricle has normal function. The left ventricle has no regional wall motion abnormalities. The left ventricular internal cavity size was normal in size. There is  moderate left ventricular hypertrophy. The interventricular septum is flattened in systole, consistent with right ventricular pressure overload. Left ventricular diastolic function could not be evaluated. Right Ventricle: The right ventricular size is mildly enlarged. No increase in right ventricular wall thickness. Right ventricular systolic function is low normal. There is moderately elevated pulmonary artery systolic pressure. The tricuspid regurgitant  velocity is 3.05 m/s, and with an assumed right atrial pressure of 15 mmHg, the estimated right ventricular systolic pressure is 52.2 mmHg. Left Atrium: Left atrial size was normal in size. Right Atrium: Right atrial size was severely dilated. Pericardium: Trivial pericardial effusion is present. Mitral Valve: The mitral valve is normal in  structure. There is mild thickening of the mitral valve leaflet(s). Mild to moderate mitral valve regurgitation. No evidence of mitral valve stenosis. Tricuspid Valve: The tricuspid valve is grossly normal. Tricuspid valve regurgitation is severe. Aortic Valve: The aortic valve is tricuspid. Aortic valve regurgitation is not visualized. Aortic valve sclerosis is present, with no evidence of aortic valve stenosis. Aortic valve mean gradient measures 3.0 mmHg. Aortic valve peak gradient measures 4.9  mmHg. Aortic valve area, by VTI measures 1.36 cm. Pulmonic Valve: The pulmonic valve was grossly normal. Pulmonic valve regurgitation is moderate. Aorta: The aortic root is normal in size and structure. Venous: The inferior vena cava is dilated in size with less than 50% respiratory variability, suggesting right atrial pressure of 15 mmHg. IAS/Shunts: There is left bowing of the interatrial septum, suggestive of elevated right atrial pressure. The interatrial septum was not assessed.  LEFT VENTRICLE PLAX 2D LVIDd:         3.30 cm     Diastology LVIDs:         2.20 cm     LV e' medial:  7.18 cm/s LV PW:         1.30 cm     LV e' lateral: 14.10 cm/s LV IVS:        1.40 cm LVOT diam:     1.80 cm LV SV:         26 LV SV Index:   16 LVOT Area:     2.54 cm  LV Volumes (MOD) LV vol d, MOD A4C: 25.3 ml LV vol s, MOD A4C: 10.7 ml LV SV MOD A4C:     25.3 ml RIGHT VENTRICLE             IVC RV Basal diam:  4.30 cm     IVC diam: 2.40 cm RV Mid diam:    4.90 cm RV S prime:     10.90 cm/s TAPSE (M-mode): 1.8 cm LEFT ATRIUM             Index        RIGHT ATRIUM           Index LA diam:        4.50 cm 2.74 cm/m   RA Area:     20.40 cm LA Vol (A2C):   52.5 ml 31.94 ml/m  RA Volume:   55.60 ml  33.83 ml/m LA Vol (A4C):   38.5 ml 23.43 ml/m LA Biplane Vol: 45.9 ml 27.93 ml/m  AORTIC VALVE AV Area (Vmax):    1.34 cm AV Area (Vmean):   1.31 cm AV Area (VTI):     1.36 cm AV Vmax:           111.00 cm/s AV Vmean:          74.900 cm/s  AV VTI:            0.189 m AV Peak Grad:      4.9 mmHg AV Mean Grad:      3.0 mmHg LVOT Vmax:         58.60 cm/s LVOT Vmean:        38.600 cm/s LVOT VTI:          0.101 m LVOT/AV VTI ratio: 0.53  AORTA Ao Root diam: 2.50 cm TRICUSPID VALVE TR Peak grad:   37.2 mmHg TR Vmax:        305.00 cm/s  SHUNTS Systemic VTI:  0.10 m Systemic Diam: 1.80 cm Vernell Leep MD Electronically signed  by Vernell Leep MD Signature Date/Time: 01/31/2022/2:40:43 PM    Final    EEG adult  Result Date: 01/31/2022 Derek Jack, MD     01/31/2022  9:28 AM Routine EEG Report Lajuana Patchell is a 81 y.o. female with a history of seizures who is undergoing an EEG to evaluate for seizures. Report: This EEG was acquired with electrodes placed according to the International 10-20 electrode system (including Fp1, Fp2, F3, F4, C3, C4, P3, P4, O1, O2, T3, T4, T5, T6, A1, A2, Fz, Cz, Pz). The following electrodes were missing or displaced: none. There was no clear waking rhythm. Best background was composed of low-amplitude slowing at 3-5 Hz. This activity is reactive to stimulation. Sleep was identified by K complexes and sleep spindles. There was no focal slowing. There were no interictal epileptiform discharges. There were no electrographic seizures identified. Photic stimulation and hyperventilation were not performed. Impression and clinical correlation: This EEG was obtained while asleep and is abnormal due to moderate-to-severe diffuse slowing indicative of global cerebral dysfunction. Epileptiform abnormalities were not seen during this recording. Su Monks, MD [facility] [phone] If 7pm- 7am, please page neurology on call as listed in [place].   DG Abd 1 View  Result Date: 01/30/2022 CLINICAL DATA:  MRI clearance. EXAM: ABDOMEN - 1 VIEW COMPARISON:  None Available. FINDINGS: Suboptimal exam due to patient positioning, and overlying cardiac leads. Calcified fibroids identified within the pelvis. Bowel gas  pattern appears normal. No suspicious radiopaque foreign bodies identified. IMPRESSION: Suboptimal exam due to patient positioning. No suspicious radiopaque foreign bodies identified. Electronically Signed   By: Kerby Moors M.D.   On: 01/30/2022 07:49   CT Head Wo Contrast  Result Date: 01/30/2022 CLINICAL DATA:  Patient found unresponsive EXAM: CT HEAD WITHOUT CONTRAST TECHNIQUE: Contiguous axial images were obtained from the base of the skull through the vertex without intravenous contrast. RADIATION DOSE REDUCTION: This exam was performed according to the departmental dose-optimization program which includes automated exposure control, adjustment of the mA and/or kV according to patient size and/or use of iterative reconstruction technique. COMPARISON:  None Available. FINDINGS: Brain: Ill-defined hypoattenuation within the cerebral white matter is nonspecific but consistent with chronic small vessel ischemic disease. This is asymmetrically increased within the right frontal lobe as well as within the right parietal lobe. No intracranial hemorrhage or mass effect. No hydrocephalus. No extra-axial fluid collection. Generalized cerebral atrophy. Vascular: No hyperdense vessel. Intracranial arterial calcification. Skull: No fracture or focal lesion. Sinuses/Orbits: No acute finding. Paranasal sinuses and mastoid air cells are well aerated. Other: None. IMPRESSION: Increased hypoattenuation within the right frontal and right parietal lobes concerning for acute infarcts with watershed distribution. MRI is recommended for further evaluation. Critical Value/emergent results were called by telephone at the time of interpretation on 01/30/2022 at 1:02 am to provider [facility] , who verbally acknowledged these results. Electronically Signed   By: Placido Sou M.D.   On: 01/30/2022 01:04   DG Chest Portable 1 View  Result Date: 01/30/2022 CLINICAL DATA:  Altered mental status EXAM: PORTABLE CHEST 1 VIEW  COMPARISON:  Radiographs 08/04/2021 FINDINGS: Stable cardiomegaly. Right IJ CVC tip at the superior cavoatrial junction. Aortic atherosclerotic calcification. No focal consolidation, pleural effusion, or pneumothorax. No acute osseous abnormality. IMPRESSION: Unchanged cardiomegaly.  No acute abnormality. Electronically Signed   By: Placido Sou M.D.   On: 01/30/2022 00:30     Subjective: No acute issues or events overnight   Discharge Exam: Vitals:   02/05/22 0804 02/05/22  1140  BP: (!) 94/51 123/75  Pulse: 88 95  Resp: 18 17  Temp: 98.6 F (37 C) 98.6 F (37 C)  SpO2: 98% 100%   Vitals:   02/05/22 0000 02/05/22 0400 02/05/22 0804 02/05/22 1140  BP: 116/64 110/68 (!) 94/51 123/75  Pulse:  97 88 95  Resp: 18 18 18 17   Temp: 98.2 F (36.8 C) 98.1 F (36.7 C) 98.6 F (37 C) 98.6 F (37 C)  TempSrc: Axillary Oral Oral Oral  SpO2: 99% 99% 98% 100%  Weight:      Height:        General: Pt is alert, awake, not in acute distress Cardiovascular: RRR, S1/S2 +, no rubs, no gallops Respiratory: CTA bilaterally, no wheezing, no rhonchi Abdominal: Soft, NT, ND, bowel sounds + Extremities: no edema, no cyanosis    The results of significant diagnostics from this hospitalization (including imaging, microbiology, ancillary and laboratory) are listed below for reference.     Microbiology: Recent Results (from the past 240 hour(s))  Urine Culture     Status: Abnormal   Collection Time: 01/30/22  2:33 AM   Specimen: Urine, Clean Catch  Result Value Ref Range Status   Specimen Description URINE, CLEAN CATCH  Final   Special Requests   Final    NONE Performed at [facility], [address] [place], [place]    Culture >=100,000 COLONIES/mL ENTEROCOCCUS FAECALIS (A)  Final   Report Status 02/02/2022 FINAL  Final   Organism ID, Bacteria ENTEROCOCCUS FAECALIS (A)  Final      Susceptibility   Enterococcus faecalis - MIC*    AMPICILLIN <=2 SENSITIVE Sensitive      NITROFURANTOIN <=16 SENSITIVE Sensitive     VANCOMYCIN 1 SENSITIVE Sensitive     * >=100,000 COLONIES/mL ENTEROCOCCUS FAECALIS  MRSA Next Gen by PCR, Nasal     Status: None   Collection Time: 01/30/22  2:22 PM   Specimen: Nasal Mucosa; Nasal Swab  Result Value Ref Range Status   MRSA by PCR Next Gen NOT DETECTED NOT DETECTED Final    Comment: (NOTE) The GeneXpert MRSA Assay (FDA approved for NASAL specimens only), is one component of a comprehensive MRSA colonization surveillance program. It is not intended to diagnose MRSA infection nor to guide or monitor treatment for MRSA infections. Test performance is not FDA approved in patients less than 71 years old. Performed at [facility], [address], [place], [place]      Labs: BNP (last 3 results) Recent Labs    05/12/21 1311 06/12/21 1141 01/30/22 0714  BNP CANCELED 1,076.2* 1,432.2*   Basic Metabolic Panel: Recent Labs  Lab 01/30/22 0032 01/30/22 0714 01/31/22 1203 02/01/22 0235 02/04/22 0459  NA 137 136 138 138 138  K 5.6* 4.8 4.5 5.5* 3.4*  CL 99 101 103 103 99  CO2 22 21* 21* 21* 30  GLUCOSE 287* 229* 132* 152* 119*  BUN 24* 28* 35* 40* 24*  CREATININE 2.87* 3.06* 3.61* 3.63* 2.45*  CALCIUM 9.5 8.7* 8.5* 8.8* 8.5*  MG  --   --   --  1.9  --   PHOS  --   --  4.7* 4.7* 3.0   Liver Function Tests: Recent Labs  Lab 01/30/22 0032 01/31/22 1203 02/04/22 0459  AST 23  --   --   ALT 10  --   --   ALKPHOS 94  --   --   BILITOT 1.5*  --   --  PROT 7.5  --   --   ALBUMIN 3.6 3.0* 2.9*   No results for input(s): "LIPASE", "AMYLASE" in the last 168 hours. No results for input(s): "AMMONIA" in the last 168 hours. CBC: Recent Labs  Lab 01/30/22 0032 01/30/22 0714 02/01/22 0235 02/04/22 0459  WBC 4.5 4.4 6.3 5.5  NEUTROABS 3.3  --   --  3.0  HGB 11.8* 9.9* 11.1* 10.4*  HCT 37.5 30.4* 35.5* 32.6*  MCV 94.5 92.4 93.7 92.4  PLT 78* 76* 85* 108*   Cardiac Enzymes: No results for  input(s): "CKTOTAL", "CKMB", "CKMBINDEX", "TROPONINI" in the last 168 hours. BNP: Invalid input(s): "POCBNP" CBG: Recent Labs  Lab 02/04/22 0620 02/04/22 1131 02/04/22 1646 02/04/22 2202 02/05/22 1141  GLUCAP 120* 196* 241* 137* 225*   D-Dimer No results for input(s): "DDIMER" in the last 72 hours. Hgb A1c No results for input(s): "HGBA1C" in the last 72 hours. Lipid Profile No results for input(s): "CHOL", "HDL", "LDLCALC", "TRIG", "CHOLHDL", "LDLDIRECT" in the last 72 hours. Thyroid function studies No results for input(s): "TSH", "T4TOTAL", "T3FREE", "THYROIDAB" in the last 72 hours.  Invalid input(s): "FREET3" Anemia work up No results for input(s): "VITAMINB12", "FOLATE", "FERRITIN", "TIBC", "IRON", "RETICCTPCT" in the last 72 hours. Urinalysis    Component Value Date/Time   COLORURINE AMBER (A) 01/30/2022 0233   APPEARANCEUR CLOUDY (A) 01/30/2022 0233   LABSPEC 1.016 01/30/2022 0233   PHURINE 5.0 01/30/2022 0233   GLUCOSEU NEGATIVE 01/30/2022 0233   HGBUR SMALL (A) 01/30/2022 0233   BILIRUBINUR NEGATIVE 01/30/2022 0233   KETONESUR NEGATIVE 01/30/2022 0233   PROTEINUR 100 (A) 01/30/2022 0233   NITRITE NEGATIVE 01/30/2022 0233   LEUKOCYTESUR LARGE (A) 01/30/2022 0233   Sepsis Labs Recent Labs  Lab 01/30/22 0032 01/30/22 0714 02/01/22 0235 02/04/22 0459  WBC 4.5 4.4 6.3 5.5   Microbiology Recent Results (from the past 240 hour(s))  Urine Culture     Status: Abnormal   Collection Time: 01/30/22  2:33 AM   Specimen: Urine, Clean Catch  Result Value Ref Range Status   Specimen Description URINE, CLEAN CATCH  Final   Special Requests   Final    NONE Performed at [facility], [address] [place], [place] [address]    Culture >=100,000 COLONIES/mL ENTEROCOCCUS FAECALIS (A)  Final   Report Status 02/02/2022 FINAL  Final   Organism ID, Bacteria ENTEROCOCCUS FAECALIS (A)  Final      Susceptibility   Enterococcus faecalis - MIC*    AMPICILLIN <=2  SENSITIVE Sensitive     NITROFURANTOIN <=16 SENSITIVE Sensitive     VANCOMYCIN 1 SENSITIVE Sensitive     * >=100,000 COLONIES/mL ENTEROCOCCUS FAECALIS  MRSA Next Gen by PCR, Nasal     Status: None   Collection Time: 01/30/22  2:22 PM   Specimen: Nasal Mucosa; Nasal Swab  Result Value Ref Range Status   MRSA by PCR Next Gen NOT DETECTED NOT DETECTED Final    Comment: (NOTE) The GeneXpert MRSA Assay (FDA approved for NASAL specimens only), is one component of a comprehensive MRSA colonization surveillance program. It is not intended to diagnose MRSA infection nor to guide or monitor treatment for MRSA infections. Test performance is not FDA approved in patients less than 55 years old. Performed at [facility], [address], [place], [place] [address]      Time coordinating discharge: Over 30 minutes  SIGNED:   Azucena Fallen, DO [facility] 02/05/2022, 1:59 PM Pager  If 7PM-7AM, please contact night-coverage [email or web address]

## 2022-02-05 NOTE — TOC Transition Note (Signed)
Transition of Care [facility]) - CM/SW Discharge Note   Patient Details  Name: [name] MRN: [id number] Date of Birth: [birth year]  [facility] CM/SW Contact:  Pollie Friar, RN Phone Number: 02/05/2022, 2:29 PM   Clinical Narrative:    Pt discharging to [facility] today. She will transport via [place]. D/c packet at the desk and bedside RN updated.   Number for report: [phone]   Final next level of care: Skilled Nursing Facility Barriers to Discharge: No Barriers Identified   Patient Goals and CMS Choice   CMS Medicare.gov Compare Post Acute Care list provided to:: Patient Represenative (must comment) Choice offered to / list presented to : Adult Children  Discharge Placement              Patient chooses bed at:  [facility]) Patient to be transferred to facility by: [facility] Name of family member notified: Daughter--[name] Patient and family notified of of transfer: 02/05/22  Discharge Plan and Services In-house Referral: Clinical Social Work Discharge Planning Services: CM Consult Post Acute Care Choice: [facility]                               Social Determinants of Health (SDOH) Interventions     Readmission Risk Interventions     No data to display

## 2022-02-05 NOTE — Evaluation (Signed)
Occupational Therapy Evaluation Patient Details Name: [name] MRN: [id number] DOB: 1940/07/10 Today's Date: 02/05/2022   History of Present Illness 81 year old female who presented with seizures; family was helping her to bed and she became unresponsive with a fixed gaze, stiffened, and shook for 2 to 3 minutes. Work up pending. Pt with history of DM, PAF, HTN, ESRD on HD   Clinical Impression   This 81 yo female admitted with above seen today with original plan to work with patient grooming seated EOB; however patient's lunch was here and she wanted to get up in recliner to eat lunch. She was min A to get up to EOB, min A sit<>stand, max A to stand pivot and set up for self feeding. She will continue to benefit from acute OT with follow up at SNF.      Recommendations for follow up therapy are one component of a multi-disciplinary discharge planning process, led by the attending physician.  Recommendations may be updated based on patient status, additional functional criteria and insurance authorization.   Follow Up Recommendations  Skilled nursing-short term rehab (<3 hours/day)    Assistance Recommended at Discharge Frequent or constant Supervision/Assistance  Patient can return home with the following A lot of help with bathing/dressing/bathroom;A lot of help with walking and/or transfers;Assistance with cooking/housework;Assistance with feeding;Direct supervision/assist for medications management;Direct supervision/assist for financial management;Assist for transportation;Help with stairs or ramp for entrance    Functional Status Assessment  Patient has had a recent decline in their functional status and demonstrates the ability to make significant improvements in function in a reasonable and predictable amount of time.  Equipment Recommendations  None recommended by OT       Precautions / Restrictions Precautions Precautions: Fall Restrictions Weight Bearing Restrictions: No       Mobility Bed Mobility Overal bed mobility: Needs Assistance Bed Mobility: Supine to Sit     Supine to sit: Min guard, HOB elevated     General bed mobility comments: increased time    Transfers Overall transfer level: Needs assistance   Transfers: Sit to/from Stand, Bed to chair/wheelchair/BSC Sit to Stand: Min assist Stand pivot transfers: Max assist                Balance Overall balance assessment: Needs assistance Sitting-balance support: No upper extremity supported, Feet supported Sitting balance-Leahy Scale: Good     Standing balance support: Bilateral upper extremity supported Standing balance-Leahy Scale: Zero                             ADL either performed or assessed with clinical judgement   ADL Overall ADL's : Needs assistance/impaired Eating/Feeding: Set up;Sitting Eating/Feeding Details (indicate cue type and reason): recliner Grooming: Wash/dry hands;Sitting;Set up Grooming Details (indicate cue type and reason): recliner                 Toilet Transfer: Maximal assistance;Stand-pivot Toilet Transfer Details (indicate cue type and reason): simulated bed to recliner                 Vision Patient Visual Report: No change from baseline              Pertinent Vitals/Pain Pain Assessment Pain Assessment: No/denies pain Faces Pain Scale: No hurt     Hand Dominance  right   Extremity/Trunk Assessment Upper Extremity Assessment Upper Extremity Assessment: Generalized weakness           Communication  No  issues   Cognition Arousal/Alertness: Awake/alert Behavior During Therapy: WFL for tasks assessed/performed Overall Cognitive Status: No family/caregiver present to determine baseline cognitive functioning                                 General Comments: pt oriented to self, place, month, year                Home Living Family/patient expects to be discharged to:: Skilled  nursing facility                                                OT Problem List: Decreased strength;Impaired balance (sitting and/or standing)      OT Treatment/Interventions: Self-care/ADL training;DME and/or AE instruction;Patient/family education;Balance training    OT Goals(Current goals can be found in the care plan section) Acute Rehab OT Goals Patient Stated Goal: to get out of bed and eat her lunch OT Goal Formulation: With patient Time For Goal Achievement: 02/14/22 Potential to Achieve Goals: Good  OT Frequency: Min 2X/week       AM-PAC OT "6 Clicks" Daily Activity     Outcome Measure Help from another person eating meals?: A Little (setup) Help from another person taking care of personal grooming?: A Lot Help from another person toileting, which includes using toliet, bedpan, or urinal?: A Lot Help from another person bathing (including washing, rinsing, drying)?: A Lot Help from another person to put on and taking off regular upper body clothing?: A Little Help from another person to put on and taking off regular lower body clothing?: Total 6 Click Score: 13   End of Session Equipment Utilized During Treatment: Gait belt  Activity Tolerance: Patient tolerated treatment well Patient left: in chair;with call bell/phone within reach;with chair alarm set  OT Visit Diagnosis: Unsteadiness on feet (R26.81);Other abnormalities of gait and mobility (R26.89);Muscle weakness (generalized) (M62.81)                Time: 4742-5956 OT Time Calculation (min): 17 min Charges:  OT General Charges $OT Visit: 1 Visit OT Treatments $Self Care/Home Management : 8-22 mins  [address], OTR/L Acute Rehab Services [facility] [phone] Office [phone]    Almon Register 02/05/2022, 3:51 PM

## 2022-02-05 NOTE — Plan of Care (Signed)
Problem: Education: Goal: Ability to describe self-care measures that may prevent or decrease complications (Diabetes Survival Skills Education) will improve 02/05/2022 0537 by Lorna Dibble, RN Outcome: Progressing 02/05/2022 0537 by Lorna Dibble, RN Outcome: Progressing Goal: Individualized Educational Video(s) 02/05/2022 0537 by Lorna Dibble, RN Outcome: Progressing 02/05/2022 0537 by Lorna Dibble, RN Outcome: Progressing   Problem: Coping: Goal: Ability to adjust to condition or change in health will improve 02/05/2022 0537 by Lorna Dibble, RN Outcome: Progressing 02/05/2022 0537 by Lorna Dibble, RN Outcome: Progressing   Problem: Fluid Volume: Goal: Ability to maintain a balanced intake and output will improve 02/05/2022 0537 by Lorna Dibble, RN Outcome: Progressing 02/05/2022 0537 by Lorna Dibble, RN Outcome: Progressing   Problem: Health Behavior/Discharge Planning: Goal: Ability to identify and utilize available resources and services will improve 02/05/2022 0537 by Lorna Dibble, RN Outcome: Progressing 02/05/2022 0537 by Lorna Dibble, RN Outcome: Progressing Goal: Ability to manage health-related needs will improve 02/05/2022 0537 by Lorna Dibble, RN Outcome: Progressing 02/05/2022 0537 by Lorna Dibble, RN Outcome: Progressing   Problem: Metabolic: Goal: Ability to maintain appropriate glucose levels will improve 02/05/2022 0537 by Lorna Dibble, RN Outcome: Progressing 02/05/2022 0537 by Lorna Dibble, RN Outcome: Progressing   Problem: Nutritional: Goal: Maintenance of adequate nutrition will improve 02/05/2022 0537 by Lorna Dibble, RN Outcome: Progressing 02/05/2022 0537 by Lorna Dibble, RN Outcome: Progressing Goal: Progress toward achieving an optimal weight will improve 02/05/2022 0537 by Lorna Dibble, RN Outcome:  Progressing 02/05/2022 0537 by Lorna Dibble, RN Outcome: Progressing   Problem: Skin Integrity: Goal: Risk for impaired skin integrity will decrease 02/05/2022 0537 by Lorna Dibble, RN Outcome: Progressing 02/05/2022 0537 by Lorna Dibble, RN Outcome: Progressing   Problem: Tissue Perfusion: Goal: Adequacy of tissue perfusion will improve 02/05/2022 0537 by Lorna Dibble, RN Outcome: Progressing 02/05/2022 0537 by Lorna Dibble, RN Outcome: Progressing   Problem: Education: Goal: Knowledge of General Education information will improve Description: Including pain rating scale, medication(s)/side effects and non-pharmacologic comfort measures 02/05/2022 0537 by Lorna Dibble, RN Outcome: Progressing 02/05/2022 0537 by Lorna Dibble, RN Outcome: Progressing   Problem: Health Behavior/Discharge Planning: Goal: Ability to manage health-related needs will improve 02/05/2022 0537 by Lorna Dibble, RN Outcome: Progressing 02/05/2022 0537 by Lorna Dibble, RN Outcome: Progressing   Problem: Clinical Measurements: Goal: Ability to maintain clinical measurements within normal limits will improve 02/05/2022 0537 by Lorna Dibble, RN Outcome: Progressing 02/05/2022 0537 by Lorna Dibble, RN Outcome: Progressing Goal: Will remain free from infection 02/05/2022 0537 by Lorna Dibble, RN Outcome: Progressing 02/05/2022 0537 by Lorna Dibble, RN Outcome: Progressing Goal: Diagnostic test results will improve 02/05/2022 0537 by Lorna Dibble, RN Outcome: Progressing 02/05/2022 0537 by Lorna Dibble, RN Outcome: Progressing Goal: Respiratory complications will improve 02/05/2022 0537 by Lorna Dibble, RN Outcome: Progressing 02/05/2022 0537 by Lorna Dibble, RN Outcome: Progressing Goal: Cardiovascular complication will be avoided 02/05/2022 0537 by Lorna Dibble,  RN Outcome: Progressing 02/05/2022 0537 by Lorna Dibble, RN Outcome: Progressing   Problem: Activity: Goal: Risk for activity intolerance will decrease 02/05/2022 0537 by Lorna Dibble, RN Outcome: Progressing 02/05/2022 0537 by Lorna Dibble, RN Outcome: Progressing   Problem: Nutrition: Goal: Adequate nutrition will be maintained 02/05/2022 0537 by Lorna Dibble, RN Outcome: Progressing 02/05/2022 0537 by Lorna Dibble, RN Outcome: Progressing  Problem: Coping: Goal: Level of anxiety will decrease 02/05/2022 0537 by Lorna Dibble, RN Outcome: Progressing 02/05/2022 0537 by Lorna Dibble, RN Outcome: Progressing   Problem: Elimination: Goal: Will not experience complications related to bowel motility 02/05/2022 0537 by Lorna Dibble, RN Outcome: Progressing 02/05/2022 0537 by Lorna Dibble, RN Outcome: Progressing Goal: Will not experience complications related to urinary retention 02/05/2022 0537 by Lorna Dibble, RN Outcome: Progressing 02/05/2022 0537 by Lorna Dibble, RN Outcome: Progressing   Problem: Pain Managment: Goal: General experience of comfort will improve 02/05/2022 0537 by Lorna Dibble, RN Outcome: Progressing 02/05/2022 0537 by Lorna Dibble, RN Outcome: Progressing   Problem: Safety: Goal: Ability to remain free from injury will improve 02/05/2022 0537 by Lorna Dibble, RN Outcome: Progressing 02/05/2022 0537 by Lorna Dibble, RN Outcome: Progressing   Problem: Skin Integrity: Goal: Risk for impaired skin integrity will decrease 02/05/2022 0537 by Lorna Dibble, RN Outcome: Progressing 02/05/2022 0537 by Lorna Dibble, RN Outcome: Progressing   Problem: Education: Goal: Knowledge of disease or condition will improve 02/05/2022 0537 by Lorna Dibble, RN Outcome: Progressing 02/05/2022 0537 by Lorna Dibble, RN Outcome:  Progressing Goal: Knowledge of secondary prevention will improve (SELECT ALL) 02/05/2022 0537 by Lorna Dibble, RN Outcome: Progressing 02/05/2022 0537 by Lorna Dibble, RN Outcome: Progressing Goal: Knowledge of patient specific risk factors will improve (INDIVIDUALIZE FOR PATIENT) 02/05/2022 0537 by Lorna Dibble, RN Outcome: Progressing 02/05/2022 0537 by Lorna Dibble, RN Outcome: Progressing Goal: Individualized Educational Video(s) 02/05/2022 0537 by Lorna Dibble, RN Outcome: Progressing 02/05/2022 0537 by Lorna Dibble, RN Outcome: Progressing   Problem: Coping: Goal: Will verbalize positive feelings about self 02/05/2022 0537 by Lorna Dibble, RN Outcome: Progressing 02/05/2022 0537 by Lorna Dibble, RN Outcome: Progressing Goal: Will identify appropriate support needs 02/05/2022 0537 by Lorna Dibble, RN Outcome: Progressing 02/05/2022 0537 by Lorna Dibble, RN Outcome: Progressing   Problem: Health Behavior/Discharge Planning: Goal: Ability to manage health-related needs will improve 02/05/2022 0537 by Lorna Dibble, RN Outcome: Progressing 02/05/2022 0537 by Lorna Dibble, RN Outcome: Progressing   Problem: Self-Care: Goal: Ability to participate in self-care as condition permits will improve 02/05/2022 0537 by Lorna Dibble, RN Outcome: Progressing 02/05/2022 0537 by Lorna Dibble, RN Outcome: Progressing Goal: Verbalization of feelings and concerns over difficulty with self-care will improve 02/05/2022 0537 by Lorna Dibble, RN Outcome: Progressing 02/05/2022 0537 by Lorna Dibble, RN Outcome: Progressing Goal: Ability to communicate needs accurately will improve 02/05/2022 0537 by Lorna Dibble, RN Outcome: Progressing 02/05/2022 0537 by Lorna Dibble, RN Outcome: Progressing   Problem: Nutrition: Goal: Risk of aspiration will  decrease 02/05/2022 0537 by Lorna Dibble, RN Outcome: Progressing 02/05/2022 0537 by Lorna Dibble, RN Outcome: Progressing Goal: Dietary intake will improve 02/05/2022 0537 by Lorna Dibble, RN Outcome: Progressing 02/05/2022 0537 by Lorna Dibble, RN Outcome: Progressing   Problem: Ischemic Stroke/TIA Tissue Perfusion: Goal: Complications of ischemic stroke/TIA will be minimized 02/05/2022 0537 by Lorna Dibble, RN Outcome: Progressing 02/05/2022 0537 by Lorna Dibble, RN Outcome: Progressing

## 2022-02-05 NOTE — Progress Notes (Signed)
[facility] Progress Note   Subjective:   Patient seen and examined at bedside.  No specific complaints.  Hoping to go home today. Denies CP, SOB, abdominal pain and n/v/d.   Objective Vitals:   02/05/22 0000 02/05/22 0400 02/05/22 0804 02/05/22 1140  BP: 116/64 110/68 (!) 94/51 123/75  Pulse:  97 88 95  Resp: 18 18 18 17   Temp: 98.2 F (36.8 C) 98.1 F (36.7 C) 98.6 F (37 C) 98.6 F (37 C)  TempSrc: Axillary Oral Oral Oral  SpO2: 99% 99% 98% 100%  Weight:      Height:       Physical Exam General:chronically ill appearing, frail, elderly female in NAD Heart:RRR, no mrg Lungs:CTAB, nml WOB on RA Abdomen:soft, NTND Extremities:no LE edema Dialysis Access: RU AVF +b/t, [facility]  Filed Weights   02/02/22 1540 02/02/22 1933 02/04/22 2100  Weight: 62.4 kg 60.6 kg 60.2 kg    Intake/Output Summary (Last 24 hours) at 02/05/2022 1424 Last data filed at 02/05/2022 0400 Gross per 24 hour  Intake 60 ml  Output 4000 ml  Net -3940 ml    Additional Objective Labs: Basic Metabolic Panel: Recent Labs  Lab 01/31/22 1203 02/01/22 0235 02/04/22 0459  NA 138 138 138  K 4.5 5.5* 3.4*  CL 103 103 99  CO2 21* 21* 30  GLUCOSE 132* 152* 119*  BUN 35* 40* 24*  CREATININE 3.61* 3.63* 2.45*  CALCIUM 8.5* 8.8* 8.5*  PHOS 4.7* 4.7* 3.0   Liver Function Tests: Recent Labs  Lab 01/30/22 0032 01/31/22 1203 02/04/22 0459  AST 23  --   --   ALT 10  --   --   ALKPHOS 94  --   --   BILITOT 1.5*  --   --   PROT 7.5  --   --   ALBUMIN 3.6 3.0* 2.9*   CBC: Recent Labs  Lab 01/30/22 0032 01/30/22 0714 02/01/22 0235 02/04/22 0459  WBC 4.5 4.4 6.3 5.5  NEUTROABS 3.3  --   --  3.0  HGB 11.8* 9.9* 11.1* 10.4*  HCT 37.5 30.4* 35.5* 32.6*  MCV 94.5 92.4 93.7 92.4  PLT 78* 76* 85* 108*   CBG: Recent Labs  Lab 02/04/22 0620 02/04/22 1131 02/04/22 1646 02/04/22 2202 02/05/22 1141  GLUCAP 120* 196* 241* 137* 225*    Medications:  sodium chloride Stopped  (02/01/22 0440)    Chlorhexidine Gluconate Cloth  6 each Topical Q0600   heparin injection (subcutaneous)  5,000 Units Subcutaneous Q8H   insulin aspart  0-6 Units Subcutaneous TID WC   levETIRAcetam  250 mg Oral BID    Dialysis Orders: [facility] TTS   TDC  57.7kg   400/1.5   3K/2.5Ca bath  Hep none - calcitriol 0.5 ug po tiw - no esa   Assessment/Plan: AMS - possible seizures. Question acute ischemic CVA by [facility] 9/29, MRI did not show any new CVA.  Neuro is following. Hx of dementia. MS improving.  Shock / hypotension - resolved, getting IV abx.  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 Next HD tomorrow.  Can be completed as outpatient.  HD access - [facility] R[facility] AVF 9/29 by Dr. Carlis Abbott BP/volume - BP variable.  Does not appear volume overloaded.  UF as tolerated.  BMM - Ca and phos at goal.  Continue VDRA.  H/o AFib - Diltiazem held. not on anticoagulation. 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 No indication for ESA at this time.  HFpEF DM2 Nutrition - Renal diet w/fluid restrictions. Alb 2.9 - protein supplements.  Dispo - ok for d/c from renal standpoint.   Jen Mow, PA-C [facility] 02/05/2022,2:24 PM  LOS: 6 days

## 2022-02-05 NOTE — Progress Notes (Signed)
Pt to d/c to [facility] today. Contacted [facility] to advise clinic of pt's d/c today and that pt will resume care tomorrow. Clinic advised of snf name that pt will be going to at d/c. RN CM confirms that snf can accommodate pt's HD schedule at d/c.   Melven Sartorius Renal Navigator [phone]

## 2022-02-05 NOTE — Plan of Care (Addendum)
Patient progressing toward goal Report called to [place] at [facility]. Patient awaiting transport via [facility]

## 2022-02-08 ENCOUNTER — Ambulatory Visit (HOSPITAL_COMMUNITY): Admission: RE | Admit: 2022-02-08 | Payer: Medicare HMO | Source: Ambulatory Visit

## 2022-02-10 ENCOUNTER — Encounter (HOSPITAL_BASED_OUTPATIENT_CLINIC_OR_DEPARTMENT_OTHER): Payer: Medicare HMO | Admitting: General Surgery

## 2022-02-16 ENCOUNTER — Other Ambulatory Visit: Payer: Self-pay | Admitting: *Deleted

## 2022-02-16 DIAGNOSIS — N186 End stage renal disease: Secondary | ICD-10-CM

## 2022-02-26 ENCOUNTER — Ambulatory Visit (HOSPITAL_COMMUNITY)
Admission: RE | Admit: 2022-02-26 | Discharge: 2022-02-26 | Disposition: A | Discharge status: Home / Self Care | Payer: Medicare HMO | Source: Ambulatory Visit | Attending: Cardiology | Admitting: Cardiology

## 2022-02-26 DIAGNOSIS — S81809D Unspecified open wound, unspecified lower leg, subsequent encounter: Secondary | ICD-10-CM | POA: Insufficient documentation

## 2022-02-26 DIAGNOSIS — S81802A Unspecified open wound, left lower leg, initial encounter: Secondary | ICD-10-CM | POA: Diagnosis not present

## 2022-02-26 DIAGNOSIS — R600 Localized edema: Secondary | ICD-10-CM | POA: Diagnosis not present

## 2022-02-26 DIAGNOSIS — S81801A Unspecified open wound, right lower leg, initial encounter: Secondary | ICD-10-CM

## 2022-03-01 ENCOUNTER — Ambulatory Visit: Payer: Medicare HMO

## 2022-03-01 VITALS — BP 105/54 | HR 77 | Resp 16 | Ht 64.0 in | Wt 125.0 lb

## 2022-03-01 DIAGNOSIS — I5032 Chronic diastolic (congestive) heart failure: Secondary | ICD-10-CM

## 2022-03-01 DIAGNOSIS — I480 Paroxysmal atrial fibrillation: Secondary | ICD-10-CM

## 2022-03-01 DIAGNOSIS — E782 Mixed hyperlipidemia: Secondary | ICD-10-CM

## 2022-03-01 DIAGNOSIS — N185 Chronic kidney disease, stage 5: Secondary | ICD-10-CM

## 2022-03-01 MED ORDER — APIXABAN 2.5 MG PO TABS: 2.5000 mg | ORAL_TABLET | Freq: Two times a day (BID) | ORAL | 3 refills | Status: DC

## 2022-03-01 NOTE — Progress Notes (Signed)
Primary Physician/Referring:  Trey Sailors, PA  Patient ID: [name], female    DOB: 01-05-1941, 81 y.o.   MRN: [id number]  Chief Complaint  Patient presents with   Hypertension   Atrial Fibrillation   Hospitalization Follow-up   HPI:    [name]  is a 81 y.o. 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   Patient was admitted 01/29/2022-02/05/2022 for suspected seizure and altered mental status. At baseline, she is oriented x 1 and has questionable history of seizures previously thought associated with hypoglycemia. Upon arrival to ED, she was more responsive, interactive and talking but then had recurrent seizure, receiving ativan and Keppra with Neurology consulting.  CTH with concern of hypoattenuation in the right frontal and right parietal lobes concerning for acute infarcts with watershed distribution. MRI brain did not show evidence of stroke. She required vasopressors transiently for hypotension. Diltiazem and pravastatin were stopped at hospital discharge.   Patient presents for hospital follow-up. Overall, she is doing well from cardiac standpoint. She is here with her daughter that she lives with that is providing history and answering questions. Patient is alert and oriented to self and only able to answer basic questions. She is not ambulatory and does have multiples falls from bed without injury per daughter.  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;  Service: Vascular;  Laterality: Left;   AV FISTULA PLACEMENT Right 01/29/2022   Procedure: RIGHT ARM BRACHIOCEPHALIC ARTERIOVENOUS (AV) FISTULA CREATION;  Surgeon: Marty Heck, MD;  Location: [facility];  Service: Vascular;  Laterality: Right;   [place] Left 08/28/2021   Procedure: LEFT ARM BASILIC VEIN TRANSPOSITION;  Surgeon: [name], MD;  Location: [place];  Service: Vascular;  Laterality: Left;   CARDIOVERSION N/A 01/14/2021   Procedure: CARDIOVERSION;  Surgeon: Adrian Prows, MD;  Location: [facility] ENDOSCOPY;  Service: Cardiovascular;  Laterality: N/A;   IR FLUORO GUIDE CV LINE RIGHT  06/15/2021   IR US GUIDE VASC ACCESS RIGHT  06/15/2021   TONSILLECTOMY     Family History  Family history unknown: Yes    No history of premature coronary artery disease or sudden cardiac death in her family. Social History   Tobacco Use   Smoking status: Never    Passive exposure: Never   Smokeless tobacco: Never  Substance Use Topics   Alcohol use: No   Marital Status: Divorced  ROS  Review of Systems  Constitutional: Negative for malaise/fatigue.  Cardiovascular:  Positive for leg swelling (chronic, stable). Negative for chest pain, claudication, near-syncope, orthopnea, palpitations, paroxysmal nocturnal dyspnea and syncope.  Respiratory:  Negative for shortness of breath.   Neurological:  Negative for dizziness.   Objective  Blood pressure (!) 105/54,  pulse 77, resp. rate 16, height _0  (1.626 m), weight 125 lb (56.7 kg), SpO2 94 %.     03/01/2022    2:39 PM 02/05/2022    9:00 PM 02/05/2022    4:00 PM  Vitals with BMI  Height _1     Weight 125 lbs    BMI 57.01     Systolic 779 390 300  Diastolic 54 73 73  Pulse 77  83     Physical Exam Vitals reviewed.  Constitutional:      Appearance: She is obese.     Comments: Mildly obese in no acute distress.  Neck:     Vascular: No carotid bruit or JVD.  Cardiovascular:     Rate and Rhythm: Normal rate. Rhythm irregular.     Pulses:          Carotid pulses are 2+ on the right side and 2+ on the left side.      Radial pulses are 2+ on the right side and 2+ on the left side.       Dorsalis pedis pulses are 1+ on the right side and 1+ on the left side.       Posterior tibial pulses are 1+ on the right side and 1+ on the left side.     Heart sounds: Normal heart sounds. No murmur heard.    No gallop.     Comments:   Pulmonary:     Effort: Pulmonary effort is normal.     Breath sounds: No wheezing or rales.  Musculoskeletal:     Right lower leg: Edema (trace) present.     Left lower leg: Edema (trace) present.  Skin:    Comments: Hair loss and atrophic changes bilateral lower legs.  Neurological:     Mental Status: She is alert.    Laboratory examination:   Recent Labs    01/31/22 1203 02/01/22 0235 02/04/22 0459  NA 138 138 138  K 4.5 5.5* 3.4*  CL 103 103 99  CO2 21* 21* 30  GLUCOSE 132* 152* 119*  BUN 35* 40* 24*  CREATININE 3.61* 3.63* 2.45*  CALCIUM 8.5* 8.8* 8.5*  GFRNONAA 12* 12* 19*   CrCl cannot be calculated (Patient's most recent lab result is older than the maximum 21 days allowed.).     Latest Ref Rng & Units 02/04/2022    4:59 AM 02/01/2022    2:35 AM 01/31/2022   12:03 PM  CMP  Glucose 70 - 99 mg/dL 119  152  132   BUN 8 - 23 mg/dL 24  40  35   Creatinine 0.44 - 1.00 mg/dL 2.45  3.63  3.61   Sodium 135 - 145 mmol/L 138  138  138   Potassium 3.5 - 5.1 mmol/L 3.4  5.5  4.5   Chloride 98 - 111 mmol/L 99  103  103   CO2 22 - 32 mmol/L _2 Calcium 8.9 - 10.3 mg/dL 8.5  8.8  8.5       Latest Ref Rng & Units 02/04/2022    4:59 AM 02/01/2022    2:35 AM  01/30/2022    7:14 AM  CBC  WBC 4.0 - 10.5 K/uL 5.5  6.3  4.4   Hemoglobin 12.0 - 15.0 g/dL 10.4  11.1  9.9   Hematocrit 36.0 - 46.0 % 32.6  35.5  30.4   Platelets 150 - 400 K/uL 108  85  76  Lipid Panel Recent Labs    01/31/22 0702  CHOL 117  TRIG 82  LDLCALC 63  VLDL 16  HDL 38*  CHOLHDL 3.1    HEMOGLOBIN A1C Lab Results  Component Value Date   HGBA1C 8.2 (H) 01/30/2022   MPG 188.64 01/30/2022   TSH No results for input(s): "TSH" in the last 8760 hours.   BNP    Component Value Date/Time   BNP 1,432.2 (H) 01/30/2022 0714    ProBNP    Component Value Date/Time   PROBNP 10,030 (H) 05/20/2021 1159   External labs:  01/16/2021: BNP 596 High-sensitivity troponin 38--> 36  05/15/2020: Serum glucose 184 mg, BUN 26, creatinine 1.60, EGFR 35 mL, sodium 137, potassium 4.3, CMP otherwise normal. A1c 10.0%. BNP 08/10/1975. Total cholesterol 224, triglycerides 46, HDL 113, LDL 103.  Non-HDL cholesterol 111. Labs 10/20/2016: TSH normal.  Allergies   Allergies  Allergen Reactions   Amlodipine Other (See Comments)    Patient has LE edema to this med at higher doses (10 mg)    Medications Prior to Visit:   Outpatient Medications Prior to Visit  Medication Sig Dispense Refill   HUMULIN 70/30 KWIKPEN (70-30) 100 UNIT/ML KwikPen Inject 5 Units into the skin daily. (Patient taking differently: Inject 10 Units into the skin at bedtime.) 15 mL 1   Hydrocortisone (KPTWSFKCL-27 EX) Apply 1 application. topically 3 (three) times daily as needed (itching).     levETIRAcetam (KEPPRA) 250 MG tablet Take 1 tablet (250 mg total) by mouth 2 (two) times daily. 60 tablet 0   Accu-Chek FastClix Lancets MISC See admin instructions.     BD PEN NEEDLE NANO 2ND GEN 32G X 4 MM MISC 3 (three) times daily. as directed     Blood Glucose Monitoring Suppl (ACCU-CHEK GUIDE ME) w/Device KIT See admin instructions.     No facility-administered medications prior to visit.   Final  Medications at End of Visit    Current Meds  Medication Sig   apixaban (ELIQUIS) 2.5 MG TABS tablet Take 1 tablet (2.5 mg total) by mouth 2 (two) times daily.   HUMULIN 70/30 KWIKPEN (70-30) 100 UNIT/ML KwikPen Inject 5 Units into the skin daily. (Patient taking differently: Inject 10 Units into the skin at bedtime.)   Hydrocortisone (NTZGYFVCB-44 EX) Apply 1 application. topically 3 (three) times daily as needed (itching).   levETIRAcetam (KEPPRA) 250 MG tablet Take 1 tablet (250 mg total) by mouth 2 (two) times daily.   Radiology:   Chest x-ray single view 04/25/2019: Mediastinum hilar structures normal. Cardiomegaly. Mild pulmonary venous congestion bilateral interstitial prominence. Findings suggest mild CHF.  Calcified pulmonary nodules noted consistent with old calcified granulomas. Small right pleural effusion cannot be excluded.  Cardiac Studies:  Echocardiogram 12/26/2020:  Normal LV systolic function with visual EF 60-65%. Left ventricle cavity  is normal in size. Mild left ventricular hypertrophy. D-shaped septum in  systole and diastole suggestive of RV pressure and volume overload.    Normal global wall motion. Doppler evidence of grade III (restrictive)  diastolic dysfunction, elevated LAP.  Left atrial cavity is mildly dilated. Atrial septum bows from right to  left, suggestive of elevated RAP.  Right atrial cavity is visually dilated.  Right ventricle cavity is visually dilated and normal function.  Mild (Grade I) aortic regurgitation.  Mild mitral valve annulus calcification. Moderate (Grade III) mitral  regurgitation.  Severe tricuspid regurgitation. Moderate pulmonary hypertension. RVSP  measures 51 mmHg (may be unappreciated given RAE and dilated IVC leading  to  equalization of pressures).  Moderate pulmonic regurgitation.  IVC is dilated with a respiratory response of <50%.  Compared to study 06/19/2020 G2DD is now G3DD,TR and PHTN remains stable  otherwise no  significant change.  ABI 06/19/2020:  This exam reveals normal perfusion of the right and left lower extremity  (ABI 1.00). Mildly abnormal biphasic waveform noted at the ankles.  There  is mild plaque evident in the lower extremity vessels at the ankles.   PCV MYOCARDIAL PERFUSION WITH LEXISCAN 06/11/2020 Nondiagnostic ECG stress. Perfusion imaging study demonstrates decreased uptake in the inferior wall suggestive of soft tissue attenuation.  Ischemia in this region cannot be completely excluded. Overall LV systolic function is normal without regional wall motion abnormalities. Stress LV EF: 82%. Low risk.  Ambulatory cardiac telemetry 10/16/20-6/30-22: Patient in atypical atrial flutter 100% of the time. Heart rate 53-130 bpm with average 87 bpm. No symptoms reported. Rare PVCs. Dr. Einar Gip assisted in interpretation of findings.  EKG:   EKG 03/01/2022: Atrial fibrillation with controlled ventricular response at 71 bpm.  Right bundle branch block. Bordline criteria for RVH. Nonspecific T wave abnormality.  Previous EKG on 03/01/2022 atrial fibrillation replaces sinus rhythm.  Assessment     ICD-10-CM   1. Paroxysmal atrial fibrillation (HCC)  I48.0 apixaban (ELIQUIS) 2.5 MG TABS tablet    2. Chronic diastolic heart failure (HCC)  I50.32 EKG 12-Lead    3. CKD (chronic kidney disease), stage V (HCC)  N18.5     4. Mixed hyperlipidemia  E78.2        There are no discontinued medications.    Meds ordered this encounter  Medications   apixaban (ELIQUIS) 2.5 MG TABS tablet    Sig: Take 1 tablet (2.5 mg total) by mouth 2 (two) times daily.    Dispense:  30 tablet    Refill:  3    Order Specific Question:   Supervising Provider    Answer:   Adrian Prows [[id number]]    This patients CHA2DS2-VASc Score 5 (HTN, DM, age, F) and yearly risk of stroke 7.2%.   Recommendations:   Pamala Hayman is a 81 y.o. 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  Most recently hospitalized 01/30/2022 through 02/05/2022 for altered mental status and seizure-like activity.  Paroxysmal atrial fibrillation (HCC) EKG today revealed atrial fibrillation with controlled ventricular response. Diltiazem was stopped during last hospitalization, ok to remain off of this given soft BP and controlled HR.  Per patient's daughter this was stopped due to dialysis and kidney failure. She has been taken off several medications due to this. She was previously on Eliquis in the past and this stopped for unknown reason. CHA2DS2-VASc score is 5.  We will start Eliquis 2.5 mg twice daily. Given history of patient falls from bed advised patient's daughter to pad the floor around the bed to prevent hitting head on floor.  Patient's daughter understands that if patient has follow-up with head injury to seek emergency care.  Chronic diastolic heart failure (HCC) No clinical evidence of heart failure on physical exam.  However recent BNP was elevated to 1432. She is not currently on diuretics or CHF guideline directed medications given medications were stopped by PCP and Nephrology.  CKD (chronic kidney disease), stage V ([place]) She is followed by nephrology.   Has hemodialysis Tuesday, Thursday, and Saturday.  Mixed  hyperlipidemia Reviewed lab results.  Lipids are under good control.  Pravastatin was stopped during last hospitalization.  Okay to remain off of this.  Follow-up in 6 weeks or sooner if needed.   Ernst Spell, AGNP-C 03/01/2022, 3:19 PM Office: [phone]

## 2022-03-01 NOTE — Progress Notes (Deleted)
  POST OPERATIVE OFFICE NOTE    CC:  F/u for surgery  HPI:  This is a 81 y.o. female who is s/p right BC AVF on 01/29/2022 by Dr. Carlis Abbott.  She has [place] that was placed 06/15/2021 by IR.   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 Donzetta Matters Right BC AVF 01/29/2022 Dr. Carlis Abbott  Pt states she does *** have pain/numbness in the *** hand.    The pt *** on dialysis *** at *** location.   Allergies  Allergen Reactions   Amlodipine Other (See Comments)    Patient has LE edema to this med at higher doses (10 mg)    Current Outpatient Medications  Medication Sig Dispense Refill   Accu-Chek FastClix Lancets MISC See admin instructions.     BD PEN NEEDLE NANO 2ND GEN 32G X 4 MM MISC 3 (three) times daily. as directed     Blood Glucose Monitoring Suppl (ACCU-CHEK GUIDE ME) w/Device KIT See admin instructions.     HUMULIN 70/30 KWIKPEN (70-30) 100 UNIT/ML KwikPen Inject 5 Units into the skin daily. (Patient taking differently: Inject 10 Units into the skin at bedtime.) 15 mL 1   Hydrocortisone (PVXYIAXKP-53 EX) Apply 1 application. topically 3 (three) times daily as needed (itching).     levETIRAcetam (KEPPRA) 250 MG tablet Take 1 tablet (250 mg total) by mouth 2 (two) times daily. 60 tablet 0   No current facility-administered medications for this visit.     ROS:  See HPI  Physical Exam:  ***  Incision:  *** Extremities:   There *** a palpable *** pulse.   Motor and sensory *** in tact.   There *** a thrill/bruit present.  Access is *** easily palpable   Dialysis Duplex on 03/02/2022: ***   Assessment/Plan:  This is a 81 y.o. female who is s/p: right BC AVF on 01/29/2022 by Dr. Carlis Abbott   -the pt does *** have evidence of steal. -pt's access can be used ***. -if pt has tunneled catheter, this can be removed at the discretion of the dialysis center once the pt's access has been successfully cannulated to their satisfaction.  -discussed with pt  that access does not last forever and will need intervention or even new access at some point.  -the pt will follow up ***   Leontine Locket, [facility] [phone]  Clinic MD:  Carlis Abbott

## 2022-03-02 ENCOUNTER — Ambulatory Visit (HOSPITAL_COMMUNITY): Payer: Medicare HMO | Attending: Vascular Surgery

## 2022-03-02 ENCOUNTER — Ambulatory Visit: Payer: Medicare HMO

## 2022-03-15 ENCOUNTER — Ambulatory Visit: Payer: Medicare HMO | Admitting: Neurology

## 2022-03-15 ENCOUNTER — Encounter: Payer: Self-pay | Admitting: Neurology

## 2022-03-27 ENCOUNTER — Emergency Department (HOSPITAL_COMMUNITY)
Admission: EM | Admit: 2022-03-27 | Discharge: 2022-03-27 | Discharge status: Against Medical Advice | Payer: Medicare HMO | Attending: Emergency Medicine | Admitting: Emergency Medicine

## 2022-03-27 DIAGNOSIS — M79606 Pain in leg, unspecified: Secondary | ICD-10-CM | POA: Diagnosis present

## 2022-03-27 DIAGNOSIS — Z5321 Procedure and treatment not carried out due to patient leaving prior to being seen by health care provider: Secondary | ICD-10-CM | POA: Insufficient documentation

## 2022-03-27 DIAGNOSIS — N189 Chronic kidney disease, unspecified: Secondary | ICD-10-CM | POA: Diagnosis not present

## 2022-03-27 DIAGNOSIS — I509 Heart failure, unspecified: Secondary | ICD-10-CM | POA: Diagnosis not present

## 2022-03-27 DIAGNOSIS — E1122 Type 2 diabetes mellitus with diabetic chronic kidney disease: Secondary | ICD-10-CM | POA: Diagnosis not present

## 2022-03-27 LAB — CBC WITH DIFFERENTIAL/PLATELET
Abs Immature Granulocytes: 0.02 10*3/uL (ref 0.00–0.07)
Basophils Absolute: 0 10*3/uL (ref 0.0–0.1)
Basophils Relative: 1 %
Eosinophils Absolute: 0.2 10*3/uL (ref 0.0–0.5)
Eosinophils Relative: 4 %
HCT: 39 % (ref 36.0–46.0)
Hemoglobin: 12.6 g/dL (ref 12.0–15.0)
Immature Granulocytes: 1 %
Lymphocytes Relative: 19 %
Lymphs Abs: 0.8 10*3/uL (ref 0.7–4.0)
MCH: 31.5 pg (ref 26.0–34.0)
MCHC: 32.3 g/dL (ref 30.0–36.0)
MCV: 97.5 fL (ref 80.0–100.0)
Monocytes Absolute: 0.5 10*3/uL (ref 0.1–1.0)
Monocytes Relative: 11 %
Neutro Abs: 2.7 10*3/uL (ref 1.7–7.7)
Neutrophils Relative %: 64 %
Platelets: 108 10*3/uL — ABNORMAL LOW (ref 150–400)
RBC: 4 MIL/uL (ref 3.87–5.11)
RDW: 14.8 % (ref 11.5–15.5)
WBC: 4.2 10*3/uL (ref 4.0–10.5)
nRBC: 0 % (ref 0.0–0.2)

## 2022-03-27 LAB — COMPREHENSIVE METABOLIC PANEL
ALT: 8 U/L (ref 0–44)
AST: 25 U/L (ref 15–41)
Albumin: 3.5 g/dL (ref 3.5–5.0)
Alkaline Phosphatase: 91 U/L (ref 38–126)
Anion gap: 15 (ref 5–15)
BUN: 9 mg/dL (ref 8–23)
CO2: 24 mmol/L (ref 22–32)
Calcium: 8.8 mg/dL — ABNORMAL LOW (ref 8.9–10.3)
Chloride: 96 mmol/L — ABNORMAL LOW (ref 98–111)
Creatinine, Ser: 1.66 mg/dL — ABNORMAL HIGH (ref 0.44–1.00)
GFR, Estimated: 31 mL/min — ABNORMAL LOW (ref >60)
Glucose, Bld: 163 mg/dL — ABNORMAL HIGH (ref 70–99)
Potassium: 4 mmol/L (ref 3.5–5.1)
Sodium: 135 mmol/L (ref 135–145)
Total Bilirubin: 1 mg/dL (ref 0.3–1.2)
Total Protein: 7.6 g/dL (ref 6.5–8.1)

## 2022-03-27 NOTE — ED Provider Triage Note (Signed)
Emergency Medicine Provider Triage Evaluation Note  [name] , a 81 y.o. female  was evaluated in triage.  Pt complains of leg pain.  History of diabetes, CKD, CHF and A-fib.  Pain and swelling in both legs.  Started couple months ago.  Denies associated shortness of breath and chest pain.  Was evaluated earlier today by her PCP who advised her to be evaluated here in the ED.  States she has no swelling is more severe in the right leg in comparison to the left. On a blood thinner.   Review of Systems  Positive: See above Negative: See above  Physical Exam  BP 118/61 (BP Location: Left Arm)   Pulse 79   Temp (!) 97.4 F (36.3 C) (Oral)   Resp 16   SpO2 100%  Gen:   Awake, no distress   Resp:  Normal effort  MSK:   Moves extremities without difficulty  Other:    Medical Decision Making  Medically screening exam initiated at 3:25 PM.  Appropriate orders placed.  [name] was informed that the remainder of the evaluation will be completed by another provider, this initial triage assessment does not replace that evaluation, and the importance of remaining in the ED until their evaluation is complete.  Work up initiated   Terry Pho, PA-C 03/27/22 1528

## 2022-03-27 NOTE — ED Triage Notes (Signed)
Patient BIB [facility] from dialysis center for evaluation of bilateral leg pain that started two months ago. Patient received full treatment at dialysis, had taken tylenol earlier today which improved pain but the pain returned after dialysis. Patient is alert, oriented, and in no apparent distress at this time.

## 2022-03-27 NOTE — ED Notes (Signed)
Pt decided to leave while waiting for a room.  

## 2022-04-03 ENCOUNTER — Emergency Department (HOSPITAL_COMMUNITY)
Admission: EM | Admit: 2022-04-03 | Discharge: 2022-04-03 | Disposition: A | Discharge status: Home / Self Care | Payer: Medicare HMO | Attending: Emergency Medicine | Admitting: Emergency Medicine

## 2022-04-03 ENCOUNTER — Other Ambulatory Visit: Payer: Self-pay

## 2022-04-03 ENCOUNTER — Emergency Department (HOSPITAL_COMMUNITY): Payer: Medicare HMO

## 2022-04-03 DIAGNOSIS — S80811A Abrasion, right lower leg, initial encounter: Secondary | ICD-10-CM | POA: Diagnosis not present

## 2022-04-03 DIAGNOSIS — R519 Headache, unspecified: Secondary | ICD-10-CM | POA: Diagnosis present

## 2022-04-03 DIAGNOSIS — Z7901 Long term (current) use of anticoagulants: Secondary | ICD-10-CM | POA: Insufficient documentation

## 2022-04-03 DIAGNOSIS — G44209 Tension-type headache, unspecified, not intractable: Secondary | ICD-10-CM | POA: Insufficient documentation

## 2022-04-03 DIAGNOSIS — S80812A Abrasion, left lower leg, initial encounter: Secondary | ICD-10-CM | POA: Diagnosis not present

## 2022-04-03 DIAGNOSIS — Z992 Dependence on renal dialysis: Secondary | ICD-10-CM | POA: Insufficient documentation

## 2022-04-03 DIAGNOSIS — X58XXXA Exposure to other specified factors, initial encounter: Secondary | ICD-10-CM | POA: Diagnosis not present

## 2022-04-03 DIAGNOSIS — Z794 Long term (current) use of insulin: Secondary | ICD-10-CM | POA: Insufficient documentation

## 2022-04-03 DIAGNOSIS — E1122 Type 2 diabetes mellitus with diabetic chronic kidney disease: Secondary | ICD-10-CM | POA: Insufficient documentation

## 2022-04-03 DIAGNOSIS — R4182 Altered mental status, unspecified: Secondary | ICD-10-CM | POA: Insufficient documentation

## 2022-04-03 DIAGNOSIS — N186 End stage renal disease: Secondary | ICD-10-CM | POA: Insufficient documentation

## 2022-04-03 DIAGNOSIS — Z8673 Personal history of transient ischemic attack (TIA), and cerebral infarction without residual deficits: Secondary | ICD-10-CM | POA: Diagnosis not present

## 2022-04-03 LAB — CBC WITH DIFFERENTIAL/PLATELET
Abs Immature Granulocytes: 0.03 10*3/uL (ref 0.00–0.07)
Basophils Absolute: 0.1 10*3/uL (ref 0.0–0.1)
Basophils Relative: 1 %
Eosinophils Absolute: 0.2 10*3/uL (ref 0.0–0.5)
Eosinophils Relative: 3 %
HCT: 35.2 % — ABNORMAL LOW (ref 36.0–46.0)
Hemoglobin: 11.5 g/dL — ABNORMAL LOW (ref 12.0–15.0)
Immature Granulocytes: 1 %
Lymphocytes Relative: 18 %
Lymphs Abs: 0.9 10*3/uL (ref 0.7–4.0)
MCH: 31.5 pg (ref 26.0–34.0)
MCHC: 32.7 g/dL (ref 30.0–36.0)
MCV: 96.4 fL (ref 80.0–100.0)
Monocytes Absolute: 0.5 10*3/uL (ref 0.1–1.0)
Monocytes Relative: 10 %
Neutro Abs: 3.4 10*3/uL (ref 1.7–7.7)
Neutrophils Relative %: 67 %
Platelets: 101 10*3/uL — ABNORMAL LOW (ref 150–400)
RBC: 3.65 MIL/uL — ABNORMAL LOW (ref 3.87–5.11)
RDW: 14.9 % (ref 11.5–15.5)
WBC: 5.1 10*3/uL (ref 4.0–10.5)
nRBC: 0 % (ref 0.0–0.2)

## 2022-04-03 LAB — COMPREHENSIVE METABOLIC PANEL
ALT: 8 U/L (ref 0–44)
AST: 19 U/L (ref 15–41)
Albumin: 3.1 g/dL — ABNORMAL LOW (ref 3.5–5.0)
Alkaline Phosphatase: 70 U/L (ref 38–126)
Anion gap: 13 (ref 5–15)
BUN: 14 mg/dL (ref 8–23)
CO2: 24 mmol/L (ref 22–32)
Calcium: 8.6 mg/dL — ABNORMAL LOW (ref 8.9–10.3)
Chloride: 98 mmol/L (ref 98–111)
Creatinine, Ser: 2.28 mg/dL — ABNORMAL HIGH (ref 0.44–1.00)
GFR, Estimated: 21 mL/min — ABNORMAL LOW (ref >60)
Glucose, Bld: 150 mg/dL — ABNORMAL HIGH (ref 70–99)
Potassium: 3.9 mmol/L (ref 3.5–5.1)
Sodium: 135 mmol/L (ref 135–145)
Total Bilirubin: 0.7 mg/dL (ref 0.3–1.2)
Total Protein: 6.6 g/dL (ref 6.5–8.1)

## 2022-04-03 NOTE — ED Provider Notes (Incomplete)
Patient is an 81 year old female with multiple medical problems including end-stage renal disease on dialysis who presents today from the dialysis unit after complaining of a headache and they noted she was altered at dialysis.  Patient reports she rolled out of bed yesterday and that she was having a headache.  However she denies at any time feeling confused.  She has been eating and drinking normally.  Does report that she has some pain in her legs because when she fell she broke the skin open.  She is not having any severe leg pain and reports she has known circulation issues in her legs.  She has not had any nausea or vomiting.  She reports now the headache feels much better.  She denies any visual changes or eye pain.  Head CT is negative for acute intracranial bleed.  Labs show stable hemoglobin and normal electrolytes today.

## 2022-04-03 NOTE — ED Notes (Signed)
Patient has been resting in bed, no s/s of distress, family present at bedside, pt and daughter aware that she is waiting on transportation. Will continue to monitor.

## 2022-04-03 NOTE — ED Provider Notes (Signed)
Ultrasound ED Peripheral IV (Provider)  Date/Time: 04/03/2022 4:23 PM  Performed by: Tedd Sias, PA Authorized by: Tedd Sias, PA   Procedure details:    Indications: multiple failed IV attempts     Skin Prep: chlorhexidine gluconate     Location:  Left AC   Angiocath:  20 G   Bedside Ultrasound Guided: Yes     Images: archived     Patient tolerated procedure without complications: Yes     Dressing applied: Yes       Tedd Sias, PA 04/03/22 1624    Wyvonnia Dusky, MD 04/03/22 1739

## 2022-04-03 NOTE — Discharge Instructions (Addendum)
While in the emergency department you received a CT head regarding your fall yesterday.  There was no acute intercranial abnormality.  You also received x-rays of your bilateral lower extremities which not show any signs of fracture or dislocation.  Please follow-up with your PCP in 24 to 48 hours for reevaluation and be sure to attend your next dialysis session.  Please return to the emergency department if you have any return of your symptoms.

## 2022-04-03 NOTE — ED Provider Notes (Signed)
[facility] EMERGENCY DEPARTMENT Provider Note   CSN: [id number] Arrival date & time: 04/03/22  1436     History  Chief Complaint  Patient presents with   Headache    A   Altered Mental Status    Zafirah Vanzee is a 81 y.o. female.   Headache Altered Mental Status Associated symptoms: headaches    The patient is an 81 year old female with past medical history of CVA, DM, ESRD (T/TH/S), seizure disorder presenting from HD by EMS for headache in the setting of a recent fall.  The patient states that she fell out of bed last night hitting her head on the floor.  She denies loss of consciousness.  She states that she has had several falls out of bed over the past few weeks.  She received approximately 1-2 hours of HD today before she began complaining of a headache and reported the fall prompting the staff at the dialysis center to call EMS.  The patient currently is reporting resolution of her headache.  She is oriented to name, location, and situation but disoriented to date.  She was hemodynamically stable throughout transport with EMS.  She is also complaining of superficial abrasions to the bilateral shins which occurred in the fall.  She denies chest pain, shortness of breath, recent fevers, nausea, vomiting, change in p.o. intake, abdominal pain.  Home Medications Prior to Admission medications   Medication Sig Start Date End Date Taking? Authorizing Provider  Accu-Chek FastClix Lancets MISC See admin instructions. 01/16/19   [provider]  apixaban (ELIQUIS) 2.5 MG TABS tablet Take 1 tablet (2.5 mg total) by mouth 2 (two) times daily. 03/01/22   Ernst Spell, NP  BD PEN NEEDLE NANO 2ND GEN 32G X 4 MM MISC 3 (three) times daily. as directed 08/13/19   [provider]  Blood Glucose Monitoring Suppl (ACCU-CHEK GUIDE ME) w/Device KIT See admin instructions. 12/11/18   [provider]  HUMULIN 70/30 KWIKPEN (70-30) 100 UNIT/ML  KwikPen Inject 5 Units into the skin daily. Patient taking differently: Inject 10 Units into the skin at bedtime. 05/28/21   Regalado, Cassie Freer, MD  Hydrocortisone ([id number]-97 EX) Apply 1 application. topically 3 (three) times daily as needed (itching).    [provider]  levETIRAcetam (KEPPRA) 250 MG tablet Take 1 tablet (250 mg total) by mouth 2 (two) times daily. 02/05/22   Little Ishikawa, MD      Allergies    Amlodipine    Review of Systems   Review of Systems  Neurological:  Positive for headaches.   See HPI Physical Exam Updated Vital Signs BP 137/75   Pulse 80   Temp 98.4 F (36.9 C) (Oral)   Resp 18   Ht _0  (1.651 m)   Wt 74.4 kg   SpO2 99%   BMI 27.29 kg/m  Physical Exam Vitals and nursing note reviewed.  Constitutional:      General: She is not in acute distress.    Appearance: She is well-developed.  HENT:     Head: Normocephalic and atraumatic.  Eyes:     Conjunctiva/sclera: Conjunctivae normal.  Cardiovascular:     Rate and Rhythm: Normal rate and regular rhythm.     Heart sounds: No murmur heard. Pulmonary:     Effort: Pulmonary effort is normal. No respiratory distress.     Breath sounds: Normal breath sounds.  Abdominal:     Palpations: Abdomen is soft.     Tenderness: There is no  abdominal tenderness.  Musculoskeletal:        General: No swelling.     Cervical back: Neck supple.     Comments: Superficial abrasions to the bilateral shins which appear to be unroofed scabs.  Skin:    General: Skin is warm and dry.     Capillary Refill: Capillary refill takes less than 2 seconds.  Neurological:     Mental Status: She is alert.     Cranial Nerves: No cranial nerve deficit.     Sensory: No sensory deficit.     Motor: No weakness.     Coordination: Coordination normal.     Comments: Oriented to name, location, and situation.  Disoriented to date.  Cranial nerves II through XII intact.  No dysmetria.  5/5 strength in the bilateral  upper and lower extremities with intact sensation.  Psychiatric:        Mood and Affect: Mood normal.     ED Results / Procedures / Treatments   Labs (all labs ordered are listed, but only abnormal results are displayed) Labs Reviewed  CBC WITH DIFFERENTIAL/PLATELET - Abnormal; Notable for the following components:      Result Value   RBC 3.65 (*)    Hemoglobin 11.5 (*)    HCT 35.2 (*)    Platelets 101 (*)    All other components within normal limits  COMPREHENSIVE METABOLIC PANEL - Abnormal; Notable for the following components:   Glucose, Bld 150 (*)    Creatinine, Ser 2.28 (*)    Calcium 8.6 (*)    Albumin 3.1 (*)    GFR, Estimated 21 (*)    All other components within normal limits    EKG None  Radiology CT Head Wo Contrast  Result Date: 04/03/2022 CLINICAL DATA:  Head trauma, minor (Age >= 65y) EXAM: CT HEAD WITHOUT CONTRAST TECHNIQUE: Contiguous axial images were obtained from the base of the skull through the vertex without intravenous contrast. RADIATION DOSE REDUCTION: This exam was performed according to the departmental dose-optimization program which includes automated exposure control, adjustment of the mA and/or kV according to patient size and/or use of iterative reconstruction technique. COMPARISON:  01/30/2022 FINDINGS: Brain: There is periventricular white matter decreased attenuation consistent with small vessel ischemic changes. Ventricles, sulci and cisterns are prominent consistent with age related involutional changes. No acute intracranial hemorrhage, mass effect or shift. No hydrocephalus. Vascular: No hyperdense vessel or unexpected calcification. Skull: Normal. Negative for fracture or focal lesion. Sinuses/Orbits: No acute finding. IMPRESSION: Atrophy and chronic small vessel ischemic changes. No acute intracranial process identified. Electronically Signed   By: Sammie Bench M.D.   On: 04/03/2022 16:55   DG Tibia/Fibula Left  Result Date:  04/03/2022 CLINICAL DATA:  Fall EXAM: LEFT TIBIA AND FIBULA - 2 VIEW; RIGHT TIBIA AND FIBULA - 2 VIEW COMPARISON:  None Available. FINDINGS: There is no evidence of fracture or other focal bone lesions. Soft tissues are unremarkable. IMPRESSION: Negative. Electronically Signed   By: Sammie Bench M.D.   On: 04/03/2022 15:59   DG Tibia/Fibula Right  Result Date: 04/03/2022 CLINICAL DATA:  Fall EXAM: LEFT TIBIA AND FIBULA - 2 VIEW; RIGHT TIBIA AND FIBULA - 2 VIEW COMPARISON:  None Available. FINDINGS: There is no evidence of fracture or other focal bone lesions. Soft tissues are unremarkable. IMPRESSION: Negative. Electronically Signed   By: Sammie Bench M.D.   On: 04/03/2022 15:59   DG Chest Portable 1 View  Result Date: 04/03/2022 CLINICAL DATA:  Fall EXAM: PORTABLE  CHEST 1 VIEW COMPARISON:  01/30/2022 FINDINGS: Cardiac silhouette is enlarged, even for an AP view. No pneumonia or pulmonary edema. No pneumothorax or pleural effusion. Aorta is calcified. Right-sided dialysis catheter tip overlies distal SVC. IMPRESSION: Enlarged cardiac silhouette.  Lungs are clear. Electronically Signed   By: Sammie Bench M.D.   On: 04/03/2022 15:57    Procedures Procedures    Medications Ordered in ED Medications - No data to display  ED Course/ Medical Decision Making/ A&P                           Medical Decision Making  The patient is an 81 year old female with past medical history of CVA, DM, ESRD (T/TH/S), seizure disorder presenting from HD by EMS for headache in the setting of a recent fall.  The differential diagnosis considered includes: CVA, tension headache, dehydration, electrolyte abnormality, TIA, migraine, intracranial injury, fracture.  Initial evaluation, the patient is hemodynamically stable and afebrile.  She was endorsing resolution of her headache.  The patient had a normal neurological exam without cranial nerve abnormality, strength deficit, sensory deficit, or dysmetria.   She was found to have superficial abrasions to the bilateral lower extremities which appeared acute on chronic.  The patient's diagnostic work-up included a CBC with white blood cell count of 5.1 and hemoglobin of 11.5; CMP with glucose 150, creatinine 2.28; CT head which did not show any signs of acute intracranial injury; plain film x-ray of the left and right tibia/fibula which did not show any signs of fracture or dislocation; and chest x-ray which not show any signs of focal consolidation to suggest pneumonia, pneumothorax, or other cardiopulmonary abnormality.  The patient was reevaluated several times and remained him symptomatic without signs of altered mental status and with no reported headache.    Based on the patient's history of headache that occurred during dialysis and resolved once stopped, negative CT head, negative laboratory workup, and benign physical exam, I doubt CVA, electrolyte abnormality, or infection at this time.  The patient was well-appearing on every reassessment and determined to be appropriate for discharge.  She was discharged with instructions to follow-up with her PCP in 24-48 hours for reevaluation and given strict return precautions to the emergency department.  Amount and/or Complexity of Data Reviewed Labs: ordered. Radiology: ordered.   Patient's presentation is most consistent with acute complicated illness / injury requiring diagnostic workup.         Final Clinical Impression(s) / ED Diagnoses Final diagnoses:  Acute non intractable tension-type headache    Rx / DC Orders ED Discharge Orders     None         Dani Gobble, MD 04/03/22 2137    Blanchie Dessert, MD 04/04/22 1401

## 2022-04-03 NOTE — ED Triage Notes (Signed)
Patient lives at her home but was at dialysis today and c/o a headache and fall to the staff, patient states she fell yesterday, abrasion noted to BLE and patient states she hit her head yesterday.  Patient did not complete dialysis today due to AMS

## 2022-04-07 ENCOUNTER — Ambulatory Visit: Payer: Medicare HMO

## 2022-04-07 ENCOUNTER — Encounter: Payer: Self-pay | Admitting: Internal Medicine

## 2022-04-07 ENCOUNTER — Ambulatory Visit: Payer: Medicare HMO | Admitting: Internal Medicine

## 2022-04-07 VITALS — BP 93/55 | HR 80 | Ht 65.0 in | Wt 129.2 lb

## 2022-04-07 DIAGNOSIS — N186 End stage renal disease: Secondary | ICD-10-CM

## 2022-04-07 DIAGNOSIS — Z794 Long term (current) use of insulin: Secondary | ICD-10-CM

## 2022-04-07 DIAGNOSIS — I480 Paroxysmal atrial fibrillation: Secondary | ICD-10-CM

## 2022-04-07 NOTE — Progress Notes (Signed)
Primary Physician/Referring:  Trey Sailors, PA  Patient ID: [name], female    DOB: May 07, 1940, 81 y.o.   MRN: [id number]  Chief Complaint  Patient presents with   Follow-up    6 week   Hypertension   Diabetes Mellitus   Congestive Heart Failure   HPI:    [name]  is a 81 y.o. 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   Patient was admitted 01/29/2022-02/05/2022 for suspected seizure and altered mental status. At baseline, she is oriented x 1 and has questionable history of seizures previously thought associated with hypoglycemia. Upon arrival to ED, she was more responsive, interactive and talking but then had recurrent seizure, receiving ativan and Keppra with Neurology consulting.  CTH with concern of hypoattenuation in the right frontal and right parietal lobes concerning for acute infarcts with watershed distribution. MRI brain did not show evidence of stroke. She required vasopressors transiently for hypotension. Diltiazem and pravastatin were stopped at hospital discharge.   Patient presents for follow-up visit. She says she has been feeling great since the last time she was here. Daughter is present for appointment. She is compliant with dialysis even though she does not enjoy going. Patient is tolerating dialysis. Patient denies chest pain, shortness of breath, palpitations, diaphoresis, syncope, edema, PND, orthopnea.   Past Medical History:  Diagnosis Date   CHF (congestive heart failure) (HCC)    Chronic kidney disease    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   Diabetes mellitus without complication (HCC)    type 2   Dyspnea    with exertion   Hypertension    Neuromuscular disorder (HCC)    neuropathy bil feet   PAF  (paroxysmal atrial fibrillation) ([place]) 08/2020   Pneumonia    Pulmonary hypertension ([place])    Past Surgical History:  Procedure Laterality Date   AV FISTULA PLACEMENT Left 06/18/2021   Procedure: LEFT ARM ARTERIOVENOUS (AV) FISTULA CREATION;  Surgeon: [name], MD;  Location: [place];  Service: Vascular;  Laterality: Left;   AV FISTULA PLACEMENT Right 01/29/2022   Procedure: RIGHT ARM BRACHIOCEPHALIC ARTERIOVENOUS (AV) FISTULA CREATION;  Surgeon: Marty Heck, MD;  Location: [place];  Service: Vascular;  Laterality: Right;   [place] Left 08/28/2021   Procedure: LEFT ARM BASILIC VEIN TRANSPOSITION;  Surgeon: [name], MD;  Location: [place];  Service: Vascular;  Laterality: Left;   CARDIOVERSION N/A 01/14/2021   Procedure: CARDIOVERSION;  Surgeon: Adrian Prows, MD;  Location[facility] ENDOSCOPY;  Service: Cardiovascular;  Laterality: N/A;   IR FLUORO GUIDE CV LINE RIGHT  06/15/2021   IR US GUIDE VASC ACCESS RIGHT  06/15/2021   TONSILLECTOMY     Family History  Family history unknown: Yes    No history of premature coronary artery disease or sudden cardiac death in her family. Social History   Tobacco Use   Smoking status: Never    Passive exposure: Never   Smokeless tobacco: Never  Substance Use Topics   Alcohol use: No   Marital Status: Divorced  ROS  Review of Systems  Constitutional: Negative for malaise/fatigue.  Cardiovascular:  Positive for leg swelling (chronic, stable). Negative for chest pain, claudication, near-syncope, orthopnea, palpitations, paroxysmal nocturnal dyspnea and syncope.  Respiratory:  Negative for shortness of breath.   Neurological:  Negative for dizziness.   Objective  Blood pressure (!) 93/55, pulse 80, height _0  (1.651 m), weight 129 lb 3.2 oz (58.6 kg), SpO2 96 %.     04/07/2022   11:05 AM 04/03/2022   11:15 PM 04/03/2022   10:00 PM  Vitals with BMI  Height _1     Weight 129 lbs 3 oz    BMI 37.3     Systolic 93 428 768  Diastolic 55 79 85  Pulse 80 84 94     Physical Exam Vitals reviewed.  Constitutional:      Appearance: She is obese.     Comments: Mildly obese in no acute distress.  Neck:     Vascular: No carotid bruit or JVD.  Cardiovascular:     Rate and Rhythm: Normal rate. Rhythm irregular.     Pulses:          Carotid pulses are 2+ on the right side and 2+ on the left side.      Radial pulses are 2+ on the right side and 2+ on the left side.       Dorsalis pedis pulses are 1+ on the right side and 1+ on the left side.       Posterior tibial pulses are 1+ on the right side and 1+ on the left side.     Heart sounds: Normal heart sounds. No murmur heard.    No gallop.     Comments:   Pulmonary:     Effort: Pulmonary effort is normal.     Breath sounds: No wheezing or rales.  Musculoskeletal:     Right lower leg: Edema (trace) present.     Left lower leg: Edema (trace) present.  Skin:    Comments: Hair loss and atrophic changes bilateral lower legs.  Neurological:     Mental Status: She is alert.    Laboratory examination:   Recent Labs    02/04/22 0459 03/27/22 1528 04/03/22 1515  NA 138 135 135  K 3.4* 4.0 3.9  CL 99 96* 98  CO2 _2 GLUCOSE 119* 163* 150*  BUN 24* 9 14  CREATININE 2.45* 1.66* 2.28*  CALCIUM 8.5* 8.8* 8.6*  GFRNONAA 19* 31* 21*   estimated creatinine clearance is 17.7 mL/min (A) (by C-G formula based on SCr of 2.28 mg/dL (H)).     Latest Ref Rng & Units 04/03/2022    3:15 PM 03/27/2022    3:28 PM 02/04/2022    4:59 AM  CMP  Glucose 70 - 99 mg/dL 150  163  119   BUN 8 - 23 mg/dL _3 Creatinine 0.44 - 1.00 mg/dL 2.28  1.66  2.45   Sodium 135 - 145 mmol/L 135  135  138   Potassium 3.5 - 5.1 mmol/L 3.9  4.0  3.4   Chloride 98 - 111 mmol/L 98  96  99   CO2 22 - 32 mmol/L _4 Calcium 8.9 - 10.3 mg/dL 8.6  8.8  8.5   Total Protein 6.5 - 8.1 g/dL 6.6  7.6    Total Bilirubin 0.3 - 1.2 mg/dL 0.7  1.0     Alkaline Phos 38 - 126 U/L 70  91    AST 15 - 41 U/L 19  25    ALT 0 - 44 U/L 8  8        Latest Ref Rng & Units 04/03/2022    3:15 PM 03/27/2022  3:28 PM 02/04/2022    4:59 AM  CBC  WBC 4.0 - 10.5 K/uL 5.1  4.2  5.5   Hemoglobin 12.0 - 15.0 g/dL 11.5  12.6  10.4   Hematocrit 36.0 - 46.0 % 35.2  39.0  32.6   Platelets 150 - 400 K/uL 101  108  108     Lipid Panel Recent Labs    01/31/22 0702  CHOL 117  TRIG 82  LDLCALC 63  VLDL 16  HDL 38*  CHOLHDL 3.1    HEMOGLOBIN A1C Lab Results  Component Value Date   HGBA1C 8.2 (H) 01/30/2022   MPG 188.64 01/30/2022   TSH No results for input(s): "TSH" in the last 8760 hours.   BNP    Component Value Date/Time   BNP 1,432.2 (H) 01/30/2022 0714    ProBNP    Component Value Date/Time   PROBNP 10,030 (H) 05/20/2021 1159   External labs:  01/16/2021: BNP 596 High-sensitivity troponin 38--> 36  05/15/2020: Serum glucose 184 mg, BUN 26, creatinine 1.60, EGFR 35 mL, sodium 137, potassium 4.3, CMP otherwise normal. A1c 10.0%. BNP 08/10/1975. Total cholesterol 224, triglycerides 46, HDL 113, LDL 103.  Non-HDL cholesterol 111. Labs 10/20/2016: TSH normal.  Allergies   Allergies  Allergen Reactions   Amlodipine Other (See Comments)    Patient has LE edema to this med at higher doses (10 mg)    Medications Prior to Visit:   Outpatient Medications Prior to Visit  Medication Sig Dispense Refill   Accu-Chek FastClix Lancets MISC See admin instructions.     apixaban (ELIQUIS) 2.5 MG TABS tablet Take 1 tablet (2.5 mg total) by mouth 2 (two) times daily. 30 tablet 3   BD PEN NEEDLE NANO 2ND GEN 32G X 4 MM MISC 3 (three) times daily. as directed     Blood Glucose Monitoring Suppl (ACCU-CHEK GUIDE ME) w/Device KIT See admin instructions.     HUMULIN 70/30 KWIKPEN (70-30) 100 UNIT/ML KwikPen Inject 5 Units into the skin daily. (Patient taking differently: Inject 10 Units into the skin at bedtime.) 15 mL 1   Hydrocortisone  ([id number]-22 EX) Apply 1 application. topically 3 (three) times daily as needed (itching).     levETIRAcetam (KEPPRA) 250 MG tablet Take 1 tablet (250 mg total) by mouth 2 (two) times daily. (Patient not taking: Reported on 04/07/2022) 60 tablet 0   No facility-administered medications prior to visit.   Final Medications at End of Visit    Current Meds  Medication Sig   Accu-Chek FastClix Lancets MISC See admin instructions.   apixaban (ELIQUIS) 2.5 MG TABS tablet Take 1 tablet (2.5 mg total) by mouth 2 (two) times daily.   BD PEN NEEDLE NANO 2ND GEN 32G X 4 MM MISC 3 (three) times daily. as directed   Blood Glucose Monitoring Suppl (ACCU-CHEK GUIDE ME) w/Device KIT See admin instructions.   HUMULIN 70/30 KWIKPEN (70-30) 100 UNIT/ML KwikPen Inject 5 Units into the skin daily. (Patient taking differently: Inject 10 Units into the skin at bedtime.)   Hydrocortisone (GURKYHCWC-37 EX) Apply 1 application. topically 3 (three) times daily as needed (itching).   Radiology:   Chest x-ray single view 04/25/2019: Mediastinum hilar structures normal. Cardiomegaly. Mild pulmonary venous congestion bilateral interstitial prominence. Findings suggest mild CHF.  Calcified pulmonary nodules noted consistent with old calcified granulomas. Small right pleural effusion cannot be excluded.  Cardiac Studies:  Echocardiogram 12/26/2020:  Normal LV systolic function with visual EF 60-65%. Left ventricle cavity  is normal in size.  Mild left ventricular hypertrophy. D-shaped septum in  systole and diastole suggestive of RV pressure and volume overload.    Normal global wall motion. Doppler evidence of grade III (restrictive)  diastolic dysfunction, elevated LAP.  Left atrial cavity is mildly dilated. Atrial septum bows from right to  left, suggestive of elevated RAP.  Right atrial cavity is visually dilated.  Right ventricle cavity is visually dilated and normal function.  Mild (Grade I) aortic  regurgitation.  Mild mitral valve annulus calcification. Moderate (Grade III) mitral  regurgitation.  Severe tricuspid regurgitation. Moderate pulmonary hypertension. RVSP  measures 51 mmHg (may be unappreciated given RAE and dilated IVC leading  to equalization of pressures).  Moderate pulmonic regurgitation.  IVC is dilated with a respiratory response of <50%.  Compared to study 06/19/2020 G2DD is now G3DD,TR and PHTN remains stable  otherwise no significant change.  ABI 06/19/2020:  This exam reveals normal perfusion of the right and left lower extremity  (ABI 1.00). Mildly abnormal biphasic waveform noted at the ankles.  There  is mild plaque evident in the lower extremity vessels at the ankles.   PCV MYOCARDIAL PERFUSION WITH LEXISCAN 06/11/2020 Nondiagnostic ECG stress. Perfusion imaging study demonstrates decreased uptake in the inferior wall suggestive of soft tissue attenuation.  Ischemia in this region cannot be completely excluded. Overall LV systolic function is normal without regional wall motion abnormalities. Stress LV EF: 82%. Low risk.  Ambulatory cardiac telemetry 10/16/20-6/30-22: Patient in atypical atrial flutter 100% of the time. Heart rate 53-130 bpm with average 87 bpm. No symptoms reported. Rare PVCs. Dr. Einar Gip assisted in interpretation of findings.  EKG:   EKG 03/01/2022: Atrial fibrillation with controlled ventricular response at 71 bpm.  Right bundle branch block. Bordline criteria for RVH. Nonspecific T wave abnormality.  Previous EKG on 03/01/2022 atrial fibrillation replaces sinus rhythm.  Assessment     ICD-10-CM   1. ESRD (end stage renal disease) ([place])  N18.6     2. Paroxysmal atrial fibrillation (HCC)  I48.0     3. Type 2 diabetes mellitus with stage 3b chronic kidney disease, with long-term current use of insulin (HCC)  E11.22    N18.32    Z79.4        Medications Discontinued During This Encounter  Medication Reason   levETIRAcetam  (KEPPRA) 250 MG tablet Discontinued by provider      No orders of the defined types were placed in this encounter.   This patients CHA2DS2-VASc Score 5 (HTN, DM, age, F) and yearly risk of stroke 7.2%.   Recommendations:   [name] is a 81 y.o. 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  Most recently hospitalized 01/30/2022 through 02/05/2022 for altered mental status and seizure-like activity.  Paroxysmal atrial fibrillation (HCC) Diltiazem was stopped during last hospitalization, ok to remain off of this given soft BP and controlled HR.   Currently rate controlled without medications and patient denies palpitations. CHA2DS2-VASc score is 5.  Continue Eliquis 2.5 mg twice daily. Given history of patient falls from bed advised patient's daughter to pad the floor around the bed to prevent hitting head on floor.  Patient's daughter understands that if patient has follow-up with head injury to seek emergency care.   ESRD She is followed by nephrology.   Has hemodialysis Tuesday, Thursday, and Saturday.   Mixed hyperlipidemia Reviewed lab  results.  Lipids are under good control.  Pravastatin was stopped during last hospitalization.   Okay to remain off of this as there is no added benefit given her age and ESRD.  Follow-up in 3 months or sooner if needed.   Floydene Flock, DO, [facility] 04/07/2022, 2:10 PM Office: [phone]

## 2022-04-14 ENCOUNTER — Ambulatory Visit: Payer: Medicare HMO | Admitting: Neurology

## 2022-05-06 ENCOUNTER — Other Ambulatory Visit: Payer: Self-pay | Admitting: *Deleted

## 2022-05-06 DIAGNOSIS — N186 End stage renal disease: Secondary | ICD-10-CM

## 2022-05-07 ENCOUNTER — Encounter (HOSPITAL_BASED_OUTPATIENT_CLINIC_OR_DEPARTMENT_OTHER): Payer: Medicare HMO | Attending: General Surgery | Admitting: General Surgery

## 2022-05-07 DIAGNOSIS — I132 Hypertensive heart and chronic kidney disease with heart failure and with stage 5 chronic kidney disease, or end stage renal disease: Secondary | ICD-10-CM | POA: Diagnosis not present

## 2022-05-07 DIAGNOSIS — L97812 Non-pressure chronic ulcer of other part of right lower leg with fat layer exposed: Secondary | ICD-10-CM | POA: Diagnosis not present

## 2022-05-07 DIAGNOSIS — N186 End stage renal disease: Secondary | ICD-10-CM | POA: Insufficient documentation

## 2022-05-07 DIAGNOSIS — E1142 Type 2 diabetes mellitus with diabetic polyneuropathy: Secondary | ICD-10-CM | POA: Insufficient documentation

## 2022-05-07 DIAGNOSIS — Z992 Dependence on renal dialysis: Secondary | ICD-10-CM | POA: Diagnosis not present

## 2022-05-07 DIAGNOSIS — E440 Moderate protein-calorie malnutrition: Secondary | ICD-10-CM | POA: Insufficient documentation

## 2022-05-07 DIAGNOSIS — I509 Heart failure, unspecified: Secondary | ICD-10-CM | POA: Diagnosis not present

## 2022-05-07 DIAGNOSIS — I872 Venous insufficiency (chronic) (peripheral): Secondary | ICD-10-CM | POA: Insufficient documentation

## 2022-05-07 DIAGNOSIS — E785 Hyperlipidemia, unspecified: Secondary | ICD-10-CM | POA: Diagnosis not present

## 2022-05-07 DIAGNOSIS — I4891 Unspecified atrial fibrillation: Secondary | ICD-10-CM | POA: Diagnosis not present

## 2022-05-07 DIAGNOSIS — E11622 Type 2 diabetes mellitus with other skin ulcer: Secondary | ICD-10-CM | POA: Diagnosis present

## 2022-05-07 DIAGNOSIS — E1122 Type 2 diabetes mellitus with diabetic chronic kidney disease: Secondary | ICD-10-CM | POA: Insufficient documentation

## 2022-05-07 NOTE — Progress Notes (Signed)
CARETHA, RUMBAUGH ([id number]) [id number] Page 1 of 4 Visit Report for 05/07/2022 Abuse Risk Screen Details Patient Name: Date of Service: [name], [name] 05/07/2022 8:00 [name] Medical Record Number: [id number] Patient Account Number: [id number] Date of Birth/Sex: Treating RN: 11/15/1940 (82 y.o. [name] Primary Care Gabriella Guile: [name] Number Other Clinician: Referring Carisma Troupe: Treating Gerldine Suleiman/Extender: Clydia Llano, Dron Weeks in Treatment: 0 Abuse Risk Screen Items Answer ABUSE RISK SCREEN: Has anyone close to you tried to hurt or harm you recentlyo No Do you feel uncomfortable with anyone in your familyo No Has anyone forced you do things that you didnt want to doo No Electronic Signature(s) Signed: 05/07/2022 12:49:58 PM By: [name] RN Entered By: [name] on 05/07/2022 08:30:28 -------------------------------------------------------------------------------- Activities of Daily Living Details Patient Name: Date of Service: [name], [name] 05/07/2022 8:00 [name] Medical Record Number: [id number] Patient Account Number: [id number] Date of Birth/Sex: Treating RN: 02/09/1941 (82 y.o. [name] Primary Care Jadiel Schmieder: [name] Number Other Clinician: Referring Alejandrina Raimer: Treating Kashif Pooler/Extender: Clydia Llano, Dron Weeks in Treatment: 0 Activities of Daily Living Items Answer Activities of Daily Living (Please select one for each item) Drive Automobile Not Able T Medications ake Need Assistance Use T elephone Need Assistance Care for Appearance Need Assistance Use T oilet Need Assistance Bath / Shower Need Assistance Dress Self Need Assistance Feed Self Need Assistance Walk Need Assistance Get In / Out Bed Need Assistance Housework Need Assistance Prepare Meals Not Able Handle Money Not Able Shop for Self Need Assistance Electronic Signature(s) Signed: 05/07/2022 12:49:58 PM By:  [name] RN Entered By: [name] on 05/07/2022 08:31:03 [name] ([id number]) [id number] Page 2 of 4 -------------------------------------------------------------------------------- Education Screening Details Patient Name: Date of Service: [name], [name] 05/07/2022 8:00 [name] Medical Record Number: [id number] Patient Account Number: [id number] Date of Birth/Sex: Treating RN: 1940-11-29 (82 y.o. [name] Primary Care Jarrin Staley: [name] Number Other Clinician: Referring Quade Ramirez: Treating Dallon Dacosta/Extender: Hermina Barters in Treatment: 0 Primary Learner Assessed: Caregiver Daughter Reason Patient is not Primary Learner: Pt. has History of Dementia Learning Preferences/Education Level/Primary Language Learning Preference: Explanation, Demonstration Highest Education Level: High School Preferred Language: English Cognitive Barrier Language Barrier: No Translator Needed: No Memory Deficit: No Emotional Barrier: No Cultural/Religious Beliefs Affecting Medical Care: No Physical Barrier Impaired Vision: No Impaired Hearing: Yes Hard of hearing Decreased Hand dexterity: No Knowledge/Comprehension Knowledge Level: High Comprehension Level: High Ability to understand written instructions: High Ability to understand verbal instructions: High Motivation Anxiety Level: Calm Cooperation: Cooperative Education Importance: Acknowledges Need Interest in Health Problems: Asks Questions Perception: Coherent Willingness to Engage in Self-Management High Activities: Readiness to Engage in Self-Management High Activities: Electronic Signature(s) Signed: 05/07/2022 12:49:58 PM By: [name] RN Entered By: [name] on 05/07/2022 08:32:35 -------------------------------------------------------------------------------- Fall Risk Assessment Details Patient Name: Date of Service: [name]  05/07/2022 8:00 [name] Medical Record Number: [id number] Patient Account Number: [id number] Date of Birth/Sex: Treating RN: 1940/05/12 (82 y.o. [name] Primary Care Rivers Gassmann: [name] Number Other Clinician: Referring Cashis Rill: Treating Franz Svec/Extender: Clydia Llano, Dron Weeks in Treatment: 0 Fall Risk Assessment Items LEVA, BAINE ([id number]) [id number] Page 3 of 4 Have you had 2 or more falls in the last 12 monthso 0 No Have you had any fall that resulted in injury in the last 12 monthso 0 Yes FALLS RISK SCREEN History of falling - immediate or within 3 months 0 No Secondary diagnosis (Do you  have 2 or more medical diagnoseso) 0 No Ambulatory aid None/bed rest/wheelchair/nurse 0 No Crutches/cane/walker 0 No Furniture 0 No Intravenous therapy Access/Saline/Heparin Lock 0 No Gait/Transferring Normal/ bed rest/ wheelchair 0 No Weak (short steps with or without shuffle, stooped but able to lift head while walking, may seek 0 No support from furniture) Impaired (short steps with shuffle, may have difficulty arising from chair, head down, impaired 0 No balance) Mental Status Oriented to own ability 0 No Electronic Signature(s) Signed: 05/07/2022 12:49:58 PM By: [name] RN Entered By: [name] on 05/07/2022 08:32:45 -------------------------------------------------------------------------------- Foot Assessment Details Patient Name: Date of Service: [name], [name] [name] 05/07/2022 8:00 [name] Medical Record Number: [id number] Patient Account Number: [id number] Date of Birth/Sex: Treating RN: 1940-12-14 (81 y.o. [name] Primary Care Talaysia Pinheiro: [name] Number Other Clinician: Referring Maxine Fredman: Treating Jimy Gates/Extender: Clydia Llano, Dron Weeks in Treatment: 0 Foot Assessment Items Site Locations + = Sensation present, - = Sensation absent, C = Callus, U = Ulcer R = Redness, W = Warmth, M =  Maceration, PU = Pre-ulcerative lesion F = Fissure, S = Swelling, D = Dryness Assessment Right: Left: Other Deformity: No No Prior Foot Ulcer: No No Prior Amputation: No No Charcot Joint: No No Ambulatory Status: Ambulatory With Help Assistance Device: Wheelchair Corona, [name] ([id number]) [phone].pdf Page 4 of 4 Gait: Administrator, arts) Signed: 05/07/2022 12:49:58 PM By: [name] RN Entered By: [name] on 05/07/2022 08:37:14 -------------------------------------------------------------------------------- Nutrition Risk Screening Details Patient Name: Date of Service: [name], [name] 05/07/2022 8:00 [name] Medical Record Number: [id number] Patient Account Number: [id number] Date of Birth/Sex: Treating RN: 1940/12/03 (82 y.o. [name] Primary Care Addy Mcmannis: [name] Number Other Clinician: Referring Julien Oscar: Treating Lylee Corrow/Extender: Clydia Llano, Dron Weeks in Treatment: 0 Height (in): 63 Weight (lbs): 123 Body Mass Index (BMI): 21.8 Nutrition Risk Screening Items Score Screening NUTRITION RISK SCREEN: I have an illness or condition that made me change the kind and/or amount of food I eat 0 No I eat fewer than two meals per day 0 No I eat few fruits and vegetables, or milk products 0 No I have three or more drinks of beer, liquor or wine almost every day 0 No I have tooth or mouth problems that make it hard for me to eat 0 No I don't always have enough money to buy the food I need 0 No I eat alone most of the time 0 No I take three or more different prescribed or over-the-counter drugs Marquez day 0 No Without wanting to, I have lost or gained 10 pounds in the last six months 0 No I am not always physically able to shop, cook and/or feed myself 0 No Nutrition Protocols Good Risk Protocol 0 No interventions needed Moderate Risk Protocol High Risk Proctocol Risk Level: Good Risk Score:  0 Electronic Signature(s) Signed: 05/07/2022 12:49:58 PM By: [name] RN Entered By: [name] on 05/07/2022 08:33:03

## 2022-05-07 NOTE — Progress Notes (Addendum)
[name] ([id number]) [id number] Page 1 of 8 Visit Report for 05/07/2022 Chief Complaint Document Details Patient Name: Date of Service: [name], [name] Marquez 05/07/2022 8:00 A M Medical Record Number: [id number] Patient Account Number: [id number] Date of Birth/Sex: Treating RN: [birth year] (82 y.o. F) Primary Care Provider: [name] Number Other Clinician: Referring Provider: Treating Provider/Extender: Clydia Llano, Dron Weeks in Treatment: 0 Information Obtained from: Patient Chief Complaint Patient presents for treatment of open ulcers due to venous insufficiency Electronic Signature(s) Signed: 05/07/2022 9:00:08 AM By: [name] MD FACS Entered By: [name] on 05/07/2022 09:00:08 -------------------------------------------------------------------------------- HPI Details Patient Name: Date of Service: [name], [name] 05/07/2022 8:00 A M Medical Record Number: [id number] Patient Account Number: [id number] Date of Birth/Sex: Treating RN: 02-27-1941 (82 y.o. F) Primary Care Provider: [name] Number Other Clinician: Referring Provider: Treating Provider/Extender: Clydia Llano, Dron Weeks in Treatment: 0 History of Present Illness HPI Description: ADMISSION 12/25/2021 This is an 82 year old woman with multiple medical comorbidities including congestive heart failure, hypertension, end-stage renal disease on hemodialysis, polyneuropathy, poorly controlled type 2 diabetes mellitus (last hemoglobin A1c available to me is from December 2022 and was 9.6). She presents to clinic today with multiple open ulcers on her bilateral lower extremities in the setting of severe edema. She says that she has compression stockings but has not worn them in some time because they are too tight. Her wounds are secondary to trauma from her wheelchair. She often sleeps in her wheelchair with her legs hanging down. ABIs in clinic today were  noncompressible on the right and 0.57 on the left. She has multiple shallow ulcers on the bilateral lower extremities. She has 2+ pitting edema and her legs are extremely tense. They are somewhat erythematous and warm. She is currently taking cephalexin prescribed by her primary care provider due to concern for cellulitis. 01/01/2022: All of her wounds are healed. Edema control with the Kerlix and Coban wraps is excellent. She does have compression stockings at home. READMISSION 05/07/2022 The patient returns with a new wound on her right lower anterior leg. She is accompanied by her daughter. They admit that she has not been wearing her compression stockings at home. The patient's daughter reports that this wound started as a blister that ruptured. She has been applying peroxide. Both indicate that the patient is still frequently sleeping in her wheelchair with her legs dependent. ABI in clinic today was 1.08. The wound is small, just medial to the anterior tibial surface, clean. Electronic Signature(s) Signed: 05/07/2022 9:01:35 AM By: [name] MD FACS Entered By: [name] on 05/07/2022 [place], Jakari ([id number]) [id number] Page 2 of 8 -------------------------------------------------------------------------------- Physical Exam Details Patient Name: Date of Service: [name], [name] 05/07/2022 8:00 A M Medical Record Number: [id number] Patient Account Number: [id number] Date of Birth/Sex: Treating RN: [birth year] (82 y.o. F) Primary Care Provider: [name] Number Other Clinician: Referring Provider: Treating Provider/Extender: Clydia Llano, Dron Weeks in Treatment: 0 Constitutional . . . . No acute distress. Respiratory Normal work of breathing on room air. Notes 05/07/2022: There is a small oval-shaped wound on the patient's right lower leg, just medial to the anterior tibial surface. It is clean without any slough or  debris accumulation. Periwound skin is intact. No concern for infection. Electronic Signature(s) Signed: 05/07/2022 9:02:29 AM By: [name] MD FACS Entered By: [name] on 05/07/2022 09:02:29 -------------------------------------------------------------------------------- Physician Orders Details Patient Name: Date of Service: [name], [name] 05/07/2022 8:00 A M Medical Record Number: [id number]  Patient Account Number: [id number] Date of Birth/Sex: Treating RN: 08-Nov-1940 (82 y.o. [name] Primary Care Provider: [name] Number Other Clinician: Referring Provider: Treating Provider/Extender: Clydia Llano, Dron Weeks in Treatment: 0 Verbal / Phone Orders: No Diagnosis Coding ICD-10 Coding Code Description I50.9 Heart failure, unspecified N18.6 End stage renal disease E44.0 Moderate protein-calorie malnutrition E11.42 Type 2 diabetes mellitus with diabetic polyneuropathy I87.2 Venous insufficiency (chronic) (peripheral) Follow-up Appointments ppointment in 1 week. - Dr. [name] Room 3 Return A Anesthetic (In clinic) Topical Lidocaine 4% applied to wound bed - Used in clinic Bathing/ Shower/ Hygiene Other Bathing/Shower/Hygiene Orders/Instructions: - Please DO NOT GET COMPRESSION WRAPS on right leg wet. Edema Control - Lymphedema / SCD / Other Other Edema Control Orders/Instructions: - Please elevate both legs throughout the day Additional Orders / Instructions Other: - Please wear compression stocking on left leg Wound Treatment [name] ([id number]) [id number] Page 3 of 8 Wound #5 - Lower Leg Wound Laterality: Right, Anterior Cleanser: Soap and Water 1 x Per Week/30 Days Discharge Instructions: May shower and wash wound with dial antibacterial soap and water prior to dressing change. Cleanser: Wound Cleanser 1 x Per Week/30 Days Discharge Instructions: Cleanse the wound with wound cleanser prior to applying  a clean dressing using gauze sponges, not tissue or cotton balls. Prim Dressing: Sorbalgon AG Dressing 2x2 (in/in) 1 x Per Week/30 Days ary Discharge Instructions: Apply to wound bed as instructed Secured With: Transpore Surgical Tape, 2x10 (in/yd) 1 x Per Week/30 Days Discharge Instructions: Secure dressing with tape as directed. Compression Wrap: ThreePress (3 layer compression wrap) 1 x Per Week/30 Days Discharge Instructions: Apply three layer compression as directed. Compression Wrap: Netting, Tubular #5 1 x Per Week/30 Days Electronic Signature(s) Signed: 05/07/2022 1:30:14 PM By: [name] MD FACS Entered By: [name] on 05/07/2022 09:03:17 -------------------------------------------------------------------------------- Problem List Details Patient Name: Date of Service: [name], [name] 05/07/2022 8:00 A M Medical Record Number: [id number] Patient Account Number: [id number] Date of Birth/Sex: Treating RN: 1940/09/05 (82 y.o. F) Primary Care Provider: [name] Number Other Clinician: Referring Provider: Treating Provider/Extender: Clydia Llano, Dron Weeks in Treatment: 0 Active Problems ICD-10 Encounter Code Description Active Date MDM Diagnosis L97.812 Non-pressure chronic ulcer of other part of right lower leg with fat layer 05/07/2022 No Yes exposed I50.9 Heart failure, unspecified 05/07/2022 No Yes N18.6 End stage renal disease 05/07/2022 No Yes E44.0 Moderate protein-calorie malnutrition 05/07/2022 No Yes E11.42 Type 2 diabetes mellitus with diabetic polyneuropathy 05/07/2022 No Yes I87.2 Venous insufficiency (chronic) (peripheral) 05/07/2022 No Yes Inactive Problems Resolved Problems [name], [name] ([id number]) [id number] Page 4 of 8 Electronic Signature(s) Signed: 05/07/2022 8:59:31 AM By: [name] MD FACS Previous Signature: 05/07/2022 8:21:58 AM Version By: [name] MD FACS Entered By: [name] on  05/07/2022 08:59:31 -------------------------------------------------------------------------------- Progress Note Details Patient Name: Date of Service: [name], [name] 05/07/2022 8:00 A M Medical Record Number: [id number] Patient Account Number: [id number] Date of Birth/Sex: Treating RN: 1941-04-06 (82 y.o. F) Primary Care Provider: [name] Number Other Clinician: Referring Provider: Treating Provider/Extender: Clydia Llano, Dron Weeks in Treatment: 0 Subjective Chief Complaint Information obtained from Patient Patient presents for treatment of open ulcers due to venous insufficiency History of Present Illness (HPI) ADMISSION 12/25/2021 This is an 82 year old woman with multiple medical comorbidities including congestive heart failure, hypertension, end-stage renal disease on hemodialysis, polyneuropathy, poorly controlled type 2 diabetes mellitus (last hemoglobin A1c available to me is from December 2022 and was 9.6). She presents to clinic today with  multiple open ulcers on her bilateral lower extremities in the setting of severe edema. She says that she has compression stockings but has not worn them in some time because they are too tight. Her wounds are secondary to trauma from her wheelchair. She often sleeps in her wheelchair with her legs hanging down. ABIs in clinic today were noncompressible on the right and 0.57 on the left. She has multiple shallow ulcers on the bilateral lower extremities. She has 2+ pitting edema and her legs are extremely tense. They are somewhat erythematous and warm. She is currently taking cephalexin prescribed by her primary care provider due to concern for cellulitis. 01/01/2022: All of her wounds are healed. Edema control with the Kerlix and Coban wraps is excellent. She does have compression stockings at home. READMISSION 05/07/2022 The patient returns with a new wound on her right lower anterior leg. She is accompanied by her daughter.  They admit that she has not been wearing her compression stockings at home. The patient's daughter reports that this wound started as a blister that ruptured. She has been applying peroxide. Both indicate that the patient is still frequently sleeping in her wheelchair with her legs dependent. ABI in clinic today was 1.08. The wound is small, just medial to the anterior tibial surface, clean. Patient History Information obtained from Patient, Caregiver, Chart. Allergies No Known Allergies Family History Heart Disease - Mother,Father, Hypertension - Mother,Father, No family history of Cancer, Diabetes, Hereditary Spherocytosis, Kidney Disease, Lung Disease, Seizures, Stroke, Thyroid Problems, Tuberculosis. Social History Never smoker, Marital Status - Divorced, Alcohol Use - Never, Drug Use - No History, Caffeine Use - Daily. Medical History Eyes Patient has history of Cataracts - bil removed Denies history of Glaucoma, Optic Neuritis Hematologic/Lymphatic Denies history of Anemia Cardiovascular Patient has history of Arrhythmia - afib, Congestive Heart Failure, Hypertension Endocrine Patient has history of Type II Diabetes Denies history of Type I Diabetes Genitourinary Patient has history of End Stage Renal Disease - HemoDialysis Integumentary (Skin) Denies history of History of Burn Neurologic Patient has history of Neuropathy Oncologic Denies history of Received Chemotherapy, Received Radiation Psychiatric [name], [name] ([id number]) [id number] Page 5 of 8 Denies history of Anorexia/bulimia, Confinement Anxiety Hospitalization/Surgery History - AV fistula plaement left arm. - tonsillectomy. Medical A Surgical History Notes nd Constitutional Symptoms (General Health) vitamin D deficiency Cardiovascular hyperlipidemia General Notes: Catheter in Right Chest (for HD) Objective Constitutional No acute distress. Vitals Time Taken: 8:27 AM, Height:  63 in, Weight: 123 lbs, BMI: 21.8, Temperature: 98.3 F, Pulse: 76 bpm, Respiratory Rate: 18 breaths/min, Blood Pressure: 109/68 mmHg, Capillary Blood Glucose: 121 mg/dl. Respiratory Normal work of breathing on room air. General Notes: 05/07/2022: There is a small oval-shaped wound on the patient's right lower leg, just medial to the anterior tibial surface. It is clean without any slough or debris accumulation. Periwound skin is intact. No concern for infection. Integumentary (Hair, Skin) Wound #5 status is Open. Original cause of wound was Blister. The date acquired was: 04/30/2022. The wound is located on the Right,Anterior Lower Leg. The wound measures 2.8cm length x 1.2cm width x 0.1cm depth; 2.639cm^2 area and 0.264cm^3 volume. There is Fat Layer (Subcutaneous Tissue) exposed. There is no tunneling or undermining noted. There is a medium amount of serous drainage noted. There is large (67-100%) pink granulation within the wound bed. There is a small (1-33%) amount of necrotic tissue within the wound bed including Adherent Slough. The periwound skin appearance exhibited: Scarring, Dry/Scaly, Hemosiderin Staining. Periwound temperature was  noted as No Abnormality. 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 There was a Three Layer Compression Therapy Procedure by [name], RN. Post procedure Diagnosis Wound #5: Same as Pre-Procedure Plan Follow-up Appointments: Return Appointment in 1 week. - Dr. [name] Room 3 Anesthetic: (In clinic) Topical Lidocaine 4% applied to wound bed - Used in clinic Bathing/  Shower/ Hygiene: Other Bathing/Shower/Hygiene Orders/Instructions: - Please DO NOT GET COMPRESSION WRAPS on right leg wet. Edema Control - Lymphedema / SCD / Other: Other Edema Control Orders/Instructions: - Please elevate both legs throughout the day Additional Orders / InstructionsANGELLI, [name] ([id number]) [id number] Page 6 of 8 Other: - Please wear compression stocking on left leg WOUND #5: - Lower Leg Wound Laterality: Right, Anterior Cleanser: Soap and Water 1 x Per Week/30 Days Discharge Instructions: May shower and wash wound with dial antibacterial soap and water prior to dressing change. Cleanser: Wound Cleanser 1 x Per Week/30 Days Discharge Instructions: Cleanse the wound with wound cleanser prior to applying a clean dressing using gauze sponges, not tissue or cotton balls. Prim Dressing: Sorbalgon AG Dressing 2x2 (in/in) 1 x Per Week/30 Days ary Discharge Instructions: Apply to wound bed as instructed Secured With: Transpore Surgical T ape, 2x10 (in/yd) 1 x Per Week/30 Days Discharge Instructions: Secure dressing with tape as directed. Com pression Wrap: ThreePress (3 layer compression wrap) 1 x Per Week/30 Days Discharge Instructions: Apply three layer compression as directed. Com pression Wrap: Netting, Tubular #5 1 x Per Week/30 Days 05/07/2022: The patient returns to clinic today with a new wound on her right lower extremity. She has not been wearing her compression stockings. There is a small oval-shaped wound on the patient's right lower leg, just medial to the anterior tibial surface. It is clean without any slough or debris accumulation. Periwound skin is intact. No concern for infection. No debridement was necessary today. We will use silver alginate and 3 layer compression. I reminded the patient of the importance of keeping her legs elevated throughout the day and trying to avoid sleeping with her legs in a dependent position. I asked her  daughter to apply the patient's compression stocking to her left leg when they arrive home. Follow-up in 1 week. Electronic Signature(s) Signed: 05/07/2022 9:04:16 AM By: [name] MD FACS Entered By: [name] on 05/07/2022 09:04:16 -------------------------------------------------------------------------------- HxROS Details Patient Name: Date of Service: [name], [name] 05/07/2022 8:00 A M Medical Record Number: [id number] Patient Account Number: [id number] Date of Birth/Sex: Treating RN: Sep 04, 1940 (82 y.o. [name] Primary Care Provider: [name] Number Other Clinician: Referring Provider: Treating Provider/Extender: Damita Dunnings Weeks in Treatment: 0 Information Obtained From Patient Caregiver Chart Constitutional Symptoms (General Health) Medical History: Past Medical History Notes: vitamin D deficiency Eyes Medical History: Positive for: Cataracts - bil removed Negative for: Glaucoma; Optic Neuritis Hematologic/Lymphatic Medical History: Negative for: Anemia Cardiovascular Medical History: Positive for: Arrhythmia - afib; Congestive Heart Failure; Hypertension Past Medical History Notes: hyperlipidemia Endocrine Medical History: Positive for: Type II Diabetes Negative for: Type I Diabetes [name], DUERKSEN ([id number]) [id number] Page 7 of 8 Time with diabetes: approx 6 yrs Treated with: Insulin Blood sugar tested every day: Yes Tested : 2 times  per day Genitourinary Medical History: Positive for: End Stage Renal Disease - HemoDialysis Integumentary (Skin) Medical History: Negative for: History of Burn Neurologic Medical History: Positive for: Neuropathy Oncologic Medical History: Negative for: Received Chemotherapy; Received Radiation Psychiatric Medical History: Negative for: Anorexia/bulimia; Confinement Anxiety HBO Extended History Items Eyes: Cataracts Immunizations Pneumococcal  Vaccine: Received Pneumococcal Vaccination: No Implantable Devices Yes Hospitalization / Surgery History Type of Hospitalization/Surgery AV fistula plaement left arm tonsillectomy Family and Social History Cancer: No; Diabetes: No; Heart Disease: Yes - Mother,Father; Hereditary Spherocytosis: No; Hypertension: Yes - Mother,Father; Kidney Disease: No; Lung Disease: No; Seizures: No; Stroke: No; Thyroid Problems: No; Tuberculosis: No; Never smoker; Marital Status - Divorced; Alcohol Use: Never; Drug Use: No History; Caffeine Use: Daily; Financial Concerns: No; Food, Clothing or Shelter Needs: No; Support System Lacking: No; Transportation Concerns: No Notes Catheter in Right Chest (for HD) Electronic Signature(s) Signed: 05/07/2022 12:49:58 PM By: [name] RN Signed: 05/07/2022 1:30:14 PM By: [name] MD FACS Entered By: [name] on 05/07/2022 08:30:20 -------------------------------------------------------------------------------- SuperBill Details Patient Name: Date of Service: MALORIE, BIGFORD Marquez 05/07/2022 Medical Record Number: [id number] Patient Account Number: [id number] Date of Birth/Sex: Treating RN: 1941-01-29 (82 y.o. F) Primary Care Provider: [name] Number Other Clinician: Referring Provider: Treating Provider/Extender: Clydia Llano, Dron Weeks in Treatment: 0 [place], [name] ([id number]) [id number] Page 8 of 8 Diagnosis Coding ICD-10 Codes Code Description [phone] Non-pressure chronic ulcer of other part of right lower leg with fat layer exposed I50.9 Heart failure, unspecified N18.6 End stage renal disease E44.0 Moderate protein-calorie malnutrition E11.42 Type 2 diabetes mellitus with diabetic polyneuropathy I87.2 Venous insufficiency (chronic) (peripheral) Facility Procedures : CPT4 Code: [id number] Description: 29924 - WOUND CARE VISIT-LEV 4 EST PT Modifier: Quantity: 1 : CPT4 Code:  [id number] Description: (Facility Use Only) 22297LG - APPLY MULTLAY COMPRS LWR RT LEG ICD-10 Diagnosis Description X21.194 Non-pressure chronic ulcer of other part of right lower leg with fat layer exposed Modifier: Quantity: 1 Physician Procedures : CPT4 Code Description Modifier [id number] - WC PHYS LEVEL 4 - EST PT ICD-10 Diagnosis Description U31.497 Non-pressure chronic ulcer of other part of right lower leg with fat layer exposed N18.6 End stage renal disease I50.9 Heart failure,  unspecified I87.2 Venous insufficiency (chronic) (peripheral) Quantity: 1 Electronic Signature(s) Signed: 05/07/2022 12:49:58 PM By: [name] RN Signed: 05/07/2022 1:30:14 PM By: [name] MD FACS Previous Signature: 05/07/2022 9:04:34 AM Version By: [name] MD FACS Entered By: [name] on 05/07/2022 09:18:26

## 2022-05-07 NOTE — Progress Notes (Signed)
CLOEY, SFERRAZZA ([id number]) [id number] Page 1 of 9 Visit Report for 05/07/2022 Allergy List Details Patient Name: Date of Service: [name], [name] 05/07/2022 8:00 A M Medical Record Number: [id number] Patient Account Number: [id number] Date of Birth/Sex: Treating RN: Aug 22, 1940 (82 y.o. [name] Primary Care Denee Boeder: [name] Number Other Clinician: Referring Philippa Vessey: Treating Kestrel Mis/Extender: Clydia Llano, Dron Weeks in Treatment: 0 Allergies Active Allergies No Known Allergies Allergy Notes Electronic Signature(s) Signed: 05/07/2022 12:49:58 PM By: [name] RN Entered By: [name] on 05/07/2022 08:28:14 -------------------------------------------------------------------------------- Arrival Information Details Patient Name: Date of Service: [name], [name] 05/07/2022 8:00 A M Medical Record Number: [id number] Patient Account Number: [id number] Date of Birth/Sex: Treating RN: [birth year] (82 y.o. [name] Primary Care Nina Mondor: [name] Number Other Clinician: Referring Halil Rentz: Treating Daking Westervelt/Extender: Clydia Llano, Dron Weeks in Treatment: 0 Visit Information Patient Arrived: Wheel Chair Arrival Time: 08:15 Accompanied By: daughter Transfer Assistance: Manual History Since Last Visit Added or deleted any medications: No Any new allergies or adverse reactions: No Had a fall or experienced change in activities of daily living that may affect risk of falls: No Signs or symptoms of abuse/neglect since last visito No Hospitalized since last visit: No Implantable device outside of the clinic excluding cellular tissue based products placed in the center since last visit: No Electronic Signature(s) Signed: 05/07/2022 12:49:58 PM By: [name] RN Entered By: [name] on 05/07/2022 08:15:48 [name] ([id number]) [id number] Page 2 of  9 -------------------------------------------------------------------------------- Clinic Level of Care Assessment Details Patient Name: Date of Service: [name], [name] 05/07/2022 8:00 A M Medical Record Number: [id number] Patient Account Number: [id number] Date of Birth/Sex: Treating RN: 12/15/1940 (82 y.o. [name] Primary Care Niya Behler: [name] Number Other Clinician: Referring Haisley Arens: Treating Estelle Skibicki/Extender: Clydia Llano, Dron Weeks in Treatment: 0 Clinic Level of Care Assessment Items TOOL 1 Quantity Score X- 1 0 Use when EandM and Procedure is performed on INITIAL visit ASSESSMENTS - Nursing Assessment / Reassessment X- 1 20 General Physical Exam (combine w/ comprehensive assessment (listed just below) when performed on new pt. evals) X- 1 25 Comprehensive Assessment (HX, ROS, Risk Assessments, Wounds Hx, etc.) ASSESSMENTS - Wound and Skin Assessment / Reassessment X- 1 10 Dermatologic / Skin Assessment (not related to wound area) ASSESSMENTS - Ostomy and/or Continence Assessment and Care []  - 0 Incontinence Assessment and Management []  - 0 Ostomy Care Assessment and Management (repouching, etc.) PROCESS - Coordination of Care X - Simple Patient / Family Education for ongoing care 1 15 []  - 0 Complex (extensive) Patient / Family Education for ongoing care X- 1 10 Staff obtains Programmer, systems, Records, T Results / Process Orders est X- 1 10 Staff telephones HHA, Nursing Homes / Clarify orders / etc []  - 0 Routine Transfer to another Facility (non-emergent condition) []  - 0 Routine Hospital Admission (non-emergent condition) []  - 0 New Admissions / Biomedical engineer / Ordering NPWT Apligraf, etc. , []  - 0 Emergency Hospital Admission (emergent condition) PROCESS - Special Needs []  - 0 Pediatric / Minor Patient Management []  - 0 Isolation Patient Management X- 1 15 Hearing / Language / Visual special needs []  - 0 Assessment of  Community assistance (transportation, D/C planning, etc.) []  - 0 Additional assistance / Altered mentation []  - 0 Support Surface(s) Assessment (bed, cushion, seat, etc.) INTERVENTIONS - Miscellaneous []  - 0 External ear exam []  - 0 Patient Transfer (multiple staff / Civil Service fast streamer / Similar devices) []  - 0 Simple Staple / Suture  removal (25 or less) []  - 0 Complex Staple / Suture removal (26 or more) []  - 0 Hypo/Hyperglycemic Management (do not check if billed separately) X- 1 15 Ankle / Brachial Index (ABI) - do not check if billed separately Has the patient been seen at the hospital within the last three years: Yes Total Score: 120 Level Of Care: New/Established - Level 4 Electronic Signature(s) Signed: 05/07/2022 12:49:58 PM By: [name] RN Entered By: [name] on 05/07/2022 09:17:38 [name] ([id number]) [id number] Page 3 of 9 -------------------------------------------------------------------------------- Compression Therapy Details Patient Name: Date of Service: [name], [name] 05/07/2022 8:00 A M Medical Record Number: [id number] Patient Account Number: [id number] Date of Birth/Sex: Treating RN: [birth year] (82 y.o. [name] Primary Care Regena Delucchi: [name] Number Other Clinician: Referring Catera Hankins: Treating Ariyan Sinnett/Extender: Clydia Llano, Dron Weeks in Treatment: 0 Compression Therapy Performed for Wound Assessment: Wound #5 Right,Anterior Lower Leg Performed By: Clinician [name], RN Compression Type: Three Layer Post Procedure Diagnosis Same as Pre-procedure Electronic Signature(s) Signed: 05/07/2022 12:49:58 PM By: [name] RN Entered By: [name] on 05/07/2022 09:01:37 -------------------------------------------------------------------------------- Encounter Discharge Information Details Patient Name: Date of Service: [name], [name] 05/07/2022 8:00 A M Medical Record Number:  [id number] Patient Account Number: [id number] Date of Birth/Sex: Treating RN: Oct 25, 1940 (82 y.o. [name] Primary Care Kurt Azimi: [name] Number Other Clinician: Referring Melven Stockard: Treating Tagan Bartram/Extender: Damita Dunnings Weeks in Treatment: 0 Encounter Discharge Information Items Discharge Condition: Stable Ambulatory Status: Wheelchair Discharge Destination: Home Transportation: Private Auto Accompanied By: daughter Schedule Follow-up Appointment: Yes Clinical Summary of Care: Patient Declined Electronic Signature(s) Signed: 05/07/2022 12:49:58 PM By: [name] RN Entered By: [name] on 05/07/2022 09:18:57 -------------------------------------------------------------------------------- Lower Extremity Assessment Details Patient Name: Date of Service: [name], [name] 05/07/2022 8:00 A M Medical Record Number: [id number] Patient Account Number: [id number] Date of Birth/Sex: Treating RN: 25-Nov-1940 (82 y.o. [name] Primary Care Marquay Kruse: [name] Number Other Clinician: Referring Dontee Jaso: Treating Philomina Leon/Extender: Clydia Llano, Dron Weeks in Treatment: 0 Edema Assessment Left: [Left: Right] [Right: :] Assessed: [Left: No] [Right: No] [Left: Edema] [Right: :] Calf Left: Right: Point of Measurement: 33 cm From Medial Instep 32 cm 31.9 cm Ankle Left: Right: Point of Measurement: 9 cm From Medial Instep 20 cm 19.9 cm Knee To Floor Left: Right: From Medial Instep 42 cm 42 cm Vascular Assessment Pulses: Dorsalis Pedis Palpable: [Right:Yes] Blood Pressure: Brachial: [RCVEL:381] Ankle: [Right:Dorsalis Pedis: 118 1.08] Electronic Signature(s) Signed: 05/07/2022 12:49:58 PM By: [name] RN Entered By: [name] on 05/07/2022 08:49:52 -------------------------------------------------------------------------------- Multi Wound Chart Details Patient Name: Date of Service: [name] [name]  05/07/2022 8:00 A M Medical Record Number: [id number] Patient Account Number: [id number] Date of Birth/Sex: Treating RN: [birth year] (82 y.o. F) Primary Care Nestor Wieneke: [name] Number Other Clinician: Referring Jaziah Kwasnik: Treating Myrick Mcnairy/Extender: Clydia Llano, Dron Weeks in Treatment: 0 Vital Signs Height(in): 63 Capillary Blood Glucose(mg/dl): 121 Weight(lbs): 123 Pulse(bpm): 76 Body Mass Index(BMI): 21.8 Blood Pressure(mmHg): 109/68 Temperature(F): 98.3 Respiratory Rate(breaths/min): 18 [5:Photos:] [N/A:N/A] Right, Anterior Lower Leg N/A N/A Wound Location: Blister N/A N/A Wounding Event: Venous Leg Ulcer N/A N/A Primary Etiology: Cataracts, Arrhythmia, Congestive N/A N/A Comorbid History: Heart Failure, Hypertension, Type II Diabetes, End Stage Renal Disease, Neuropathy 04/30/2022 N/A N/A Date AcquiredCAMREN, HENTHORN ([id number]) [phone] Page 5 of 9 0 N/A N/A Weeks of Treatment: Open N/A N/A Wound Status: No N/A N/A Wound Recurrence: 2.8x1.2x0.1 N/A N/A Measurements L x W x D (cm) 2.639 N/A N/A  A (cm) : rea 0.264 N/A N/A Volume (cm) : Full Thickness Without Exposed N/A N/A Classification: Support Structures Medium N/A N/A Exudate Amount: Serous N/A N/A Exudate Type: amber N/A N/A Exudate Color: Large (67-100%) N/A N/A Granulation Amount: Pink N/A N/A Granulation Quality: Small (1-33%) N/A N/A Necrotic Amount: Fat Layer (Subcutaneous Tissue): Yes N/A N/A Exposed Structures: Fascia: No Tendon: No Muscle: No Joint: No Bone: No None N/A N/A Epithelialization: Scarring: Yes N/A N/A Periwound Skin Texture: Dry/Scaly: Yes N/A N/A Periwound Skin Moisture: Hemosiderin Staining: Yes N/A N/A Periwound Skin Color: No Abnormality N/A N/A Temperature: Treatment Notes Electronic Signature(s) Signed: 05/07/2022 8:59:40 AM By: [name] MD FACS Entered By: [name] on 05/07/2022  08:59:40 -------------------------------------------------------------------------------- Multi-Disciplinary Care Plan Details Patient Name: Date of Service: [name], [name] 05/07/2022 8:00 A M Medical Record Number: [id number] Patient Account Number: [id number] Date of Birth/Sex: Treating RN: 1941/02/19 (82 y.o. [name] Primary Care Leston Schueller: [name] Number Other Clinician: Referring Arya Luttrull: Treating Brigg Cape/Extender: Clydia Llano, Dron Weeks in Treatment: 0 Active Inactive Wound/Skin Impairment Nursing Diagnoses: Knowledge deficit related to ulceration/compromised skin integrity Goals: Patient/caregiver will verbalize understanding of skin care regimen Date Initiated: 05/07/2022 Target Resolution Date: 08/01/2022 Goal Status: Active Interventions: Assess ulceration(s) every visit Treatment Activities: Skin care regimen initiated : 05/07/2022 Notes: Electronic Signature(s) Signed: 05/07/2022 12:49:58 PM By: [name] RN Entered By: [name] on 05/07/2022 09:16:30 [name] ([id number]) [id number] Page 6 of 9 -------------------------------------------------------------------------------- Pain Assessment Details Patient Name: Date of Service: [name], [name] 05/07/2022 8:00 A M Medical Record Number: [id number] Patient Account Number: [id number] Date of Birth/Sex: Treating RN: [birth year] (82 y.o. [name] Primary Care Lizzete Gough: [name] Number Other Clinician: Referring Artina Minella: Treating Adrie Picking/Extender: Clydia Llano, Dron Weeks in Treatment: 0 Active Problems Location of Pain Severity and Description of Pain Patient Has Paino Yes Site Locations Pain Location: Generalized Pain With Dressing Change: No Duration of the Pain. Constant / Intermittento Constant Rate the pain. Current Pain Level: 3 Worst Pain Level: 10 Least Pain Level: 3 Tolerable Pain Level: 3 Character of  Pain Describe the Pain: Difficult to Pinpoint Pain Management and Medication Current Pain Management: Medication: Yes Cold Application: No Rest: Yes Massage: No Activity: No T.E.N.S.: No Heat Application: No Leg drop or elevation: No Is the Current Pain Management Adequate: Adequate How does your wound impact your activities of daily livingo Sleep: No Bathing: No Appetite: No Relationship With Others: No Bladder Continence: No Emotions: No Bowel Continence: No Work: No Toileting: No Drive: No Dressing: No Hobbies: No Electronic Signature(s) Signed: 05/07/2022 12:49:58 PM By: [name] RN Entered By: [name] on 05/07/2022 08:50:05 -------------------------------------------------------------------------------- Patient/Caregiver Education Details Patient Name: Date of Service: Osvaldo Angst Marquez 1/5/2024andnbsp8:00 A M Medical Record Number: [id number] Patient Account Number: [id number] Date of Birth/Gender: Treating RN: 07-19-1940 (82 y.o. [address] [place], [place] ([id number]) [id number] Page 7 of 9 Primary Care Physician: [name] Number Other Clinician: Referring Physician: Treating Physician/Extender: Hermina Barters in Treatment: 0 Education Assessment Education Provided To: Caregiver Education Topics Provided Wound/Skin Impairment: Methods: Explain/Verbal Responses: Return demonstration correctly Electronic Signature(s) Signed: 05/07/2022 12:49:58 PM By: [name] RN Entered By: [name] on 05/07/2022 09:16:42 -------------------------------------------------------------------------------- Wound Assessment Details Patient Name: Date of Service: [name], [name] 05/07/2022 8:00 A M Medical Record Number: [id number] Patient Account Number: [id number] Date of Birth/Sex: Treating RN: 12/26/1940 (82 y.o. [name] Primary Care Anan Dapolito: [name] Number Other  Clinician: Referring Loxley Schmale: Treating Zerah Hilyer/Extender: [name]  Julaine Fusi, Dron Weeks in Treatment: 0 Wound Status Wound Number: 5 Primary Venous Leg Ulcer Etiology: Wound Location: Right, Anterior Lower Leg Wound Open Wounding Event: Blister Status: Date Acquired: 04/30/2022 Comorbid Cataracts, Arrhythmia, Congestive Heart Failure, Hypertension, Weeks Of Treatment: 0 History: Type II Diabetes, End Stage Renal Disease, Neuropathy Clustered Wound: No Photos Wound Measurements Length: (cm) 2.8 Width: (cm) 1.2 Depth: (cm) 0.1 Area: (cm) 2.639 Volume: (cm) 0.264 % Reduction in Area: % Reduction in Volume: Epithelialization: None Tunneling: No Undermining: No Wound Description Classification: Full Thickness Without Exposed Support Exudate Amount: Medium Exudate Type: Serous Exudate Color: amber Structures Foul Odor After Cleansing: No Slough/Fibrino Yes Wound Bed [name], [name] ([id number]) [id number] Page 8 of 9 Granulation Amount: Large (67-100%) Exposed Structure Granulation Quality: Pink Fascia Exposed: No Necrotic Amount: Small (1-33%) Fat Layer (Subcutaneous Tissue) Exposed: Yes Necrotic Quality: Adherent Slough Tendon Exposed: No Muscle Exposed: No Joint Exposed: No Bone Exposed: No Periwound Skin Texture Texture Color No Abnormalities Noted: No No Abnormalities Noted: No Scarring: Yes Hemosiderin Staining: Yes Moisture Temperature / Pain No Abnormalities Noted: No Temperature: No Abnormality Dry / Scaly: Yes Treatment Notes Wound #5 (Lower Leg) Wound Laterality: Right, Anterior Cleanser Soap and Water Discharge Instruction: May shower and wash wound with dial antibacterial soap and water prior to dressing change. Wound Cleanser Discharge Instruction: Cleanse the wound with wound cleanser prior to applying a clean dressing using gauze sponges, not tissue or cotton balls. Peri-Wound Care Topical Primary  Dressing Sorbalgon AG Dressing 2x2 (in/in) Discharge Instruction: Apply to wound bed as instructed Secondary Dressing Secured With Transpore Surgical Tape, 2x10 (in/yd) Discharge Instruction: Secure dressing with tape as directed. Compression Wrap ThreePress (3 layer compression wrap) Discharge Instruction: Apply three layer compression as directed. Netting, Tubular #5 Compression Stockings Add-Ons Electronic Signature(s) Signed: 05/07/2022 10:36:23 AM By: Worthy Rancher Signed: 05/07/2022 12:49:58 PM By: [name] RN Entered By: Worthy Rancher on 05/07/2022 08:42:47 -------------------------------------------------------------------------------- Vitals Details Patient Name: Date of Service: SIEARRA, AMBERG Marquez 05/07/2022 8:00 A M Medical Record Number: [id number] Patient Account Number: [id number] Date of Birth/Sex: Treating RN: [birth year] (82 y.o. F) Primary Care Tuvia Woodrick: [name] Number Other Clinician: Referring Jonquil Stubbe: Treating Rowynn Mcweeney/Extender: Clydia Llano, Dron Weeks in Treatment: 0 Vital Signs Time Taken: 08:27 Temperature (F): 98.3 Height (in): 63 Pulse (bpm): 76 [place], [name] ([id number]) [id number] Page 9 of 9 Weight (lbs): 123 Respiratory Rate (breaths/min): 18 Body Mass Index (BMI): 21.8 Blood Pressure (mmHg): 109/68 Capillary Blood Glucose (mg/dl): 121 Reference Range: 80 - 120 mg / dl Electronic Signature(s) Signed: 05/07/2022 10:36:23 AM By: Worthy Rancher Entered By: Worthy Rancher on 05/07/2022 45:03:88

## 2022-05-11 ENCOUNTER — Ambulatory Visit: Payer: Medicare HMO | Admitting: Vascular Surgery

## 2022-05-11 ENCOUNTER — Ambulatory Visit (HOSPITAL_COMMUNITY): Payer: Medicare HMO

## 2022-05-14 ENCOUNTER — Ambulatory Visit (HOSPITAL_BASED_OUTPATIENT_CLINIC_OR_DEPARTMENT_OTHER): Payer: Medicare HMO | Admitting: General Surgery

## 2022-05-21 ENCOUNTER — Encounter (HOSPITAL_BASED_OUTPATIENT_CLINIC_OR_DEPARTMENT_OTHER): Payer: Medicare HMO | Admitting: General Surgery

## 2022-05-25 ENCOUNTER — Ambulatory Visit (HOSPITAL_COMMUNITY)
Admission: RE | Admit: 2022-05-25 | Discharge: 2022-05-25 | Disposition: A | Discharge status: Home / Self Care | Payer: Medicare HMO | Source: Ambulatory Visit | Attending: Vascular Surgery | Admitting: Vascular Surgery

## 2022-05-25 ENCOUNTER — Encounter: Payer: Self-pay | Admitting: Vascular Surgery

## 2022-05-25 ENCOUNTER — Ambulatory Visit: Payer: Medicare HMO | Admitting: Vascular Surgery

## 2022-05-25 VITALS — BP 95/57 | HR 75 | Temp 97.3°F | Resp 14

## 2022-05-25 DIAGNOSIS — N186 End stage renal disease: Secondary | ICD-10-CM | POA: Diagnosis not present

## 2022-05-25 NOTE — Progress Notes (Signed)
[name] MRN: [id number] DOB: 1941-01-07 Sex: female  REASON FOR CONSULT: Evaluate maturity of right arm AVF  HPI: [name] is a 82 y.o. female, with hx DM, atrial fibrillation, ESRD that presents for evaluation of right arm AVF.  She had a right brachiocephalic AVF on 5/95/63.  According to the patient's daughter they tried to use this about a month ago and it did not work for a full session.  She is currently using a right IJ TDC.  Patient denies any numbness or tingling in her right hand to suggest steal syndrome.  Past Medical History:  Diagnosis Date   CHF (congestive heart failure) (HCC)    Chronic kidney disease    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   Diabetes mellitus without complication (HCC)    type 2   Dyspnea    with exertion   Hypertension    Neuromuscular disorder (HCC)    neuropathy bil feet   PAF (paroxysmal atrial fibrillation) ([place]) 08/2020   Pneumonia    Pulmonary hypertension ([place])     Past Surgical History:  Procedure Laterality Date   AV FISTULA PLACEMENT Left 06/18/2021   Procedure: LEFT ARM ARTERIOVENOUS (AV) FISTULA CREATION;  Surgeon: [name], MD;  Location: [facility];  Service: Vascular;  Laterality: Left;   AV FISTULA PLACEMENT Right 01/29/2022   Procedure: RIGHT ARM BRACHIOCEPHALIC ARTERIOVENOUS (AV) FISTULA CREATION;  Surgeon: Marty Heck, MD;  Location: [facility];  Service: Vascular;  Laterality: Right;   [place] Left 08/28/2021   Procedure: LEFT ARM BASILIC VEIN TRANSPOSITION;  Surgeon: [name], MD;  Location: [place];  Service: Vascular;  Laterality: Left;   CARDIOVERSION N/A 01/14/2021   Procedure: CARDIOVERSION;  Surgeon: Adrian Prows, MD;  Location: [facility] ENDOSCOPY;  Service: Cardiovascular;  Laterality: N/A;   IR FLUORO GUIDE CV LINE RIGHT  06/15/2021   IR US GUIDE VASC ACCESS RIGHT  06/15/2021   TONSILLECTOMY      Family History  Family history unknown: Yes    SOCIAL  HISTORY: Social History   Socioeconomic History   Marital status: Divorced    Spouse name: Not on file   Number of children: 5   Years of education: Not on file   Highest education level: Not on file  Occupational History   Not on file  Tobacco Use   Smoking status: Never    Passive exposure: Never   Smokeless tobacco: Never  Vaping Use   Vaping Use: Never used  Substance and Sexual Activity   Alcohol use: No   Drug use: No   Sexual activity: Not Currently    Birth control/protection: Post-menopausal  Other Topics Concern   Not on file  Social History Narrative   4 living children   Social Determinants of Health   Financial Resource Strain: Not on file  Food Insecurity: Not on file  Transportation Needs: Not on file  Physical Activity: Not on file  Stress: Not on file  Social Connections: Not on file  Intimate Partner Violence: Not on file    Allergies  Allergen Reactions   Amlodipine Other (See Comments)    Patient has LE edema to this med at higher doses (10 mg)    Current Outpatient Medications  Medication Sig Dispense Refill   Accu-Chek FastClix Lancets MISC See admin instructions.     apixaban (ELIQUIS) 2.5 MG TABS tablet Take 1 tablet (2.5 mg total) by mouth 2 (two) times daily. 30 tablet 3  BD PEN NEEDLE NANO 2ND GEN 32G X 4 MM MISC 3 (three) times daily. as directed     Blood Glucose Monitoring Suppl (ACCU-CHEK GUIDE ME) w/Device KIT See admin instructions.     HUMULIN 70/30 KWIKPEN (70-30) 100 UNIT/ML KwikPen Inject 5 Units into the skin daily. (Patient taking differently: Inject 10 Units into the skin at bedtime.) 15 mL 1   Hydrocortisone ([id number]-74 EX) Apply 1 application. topically 3 (three) times daily as needed (itching).     No current facility-administered medications for this visit.    REVIEW OF SYSTEMS:  [X]  denotes positive finding, [ ]  denotes negative finding Cardiac  Comments:  Chest pain or chest pressure:    Shortness of breath  upon exertion:    Short of breath when lying flat:    Irregular heart rhythm:        Vascular    Pain in calf, thigh, or hip brought on by ambulation:    Pain in feet at night that wakes you up from your sleep:     Blood clot in your veins:    Leg swelling:         Pulmonary    Oxygen at home:    Productive cough:     Wheezing:         Neurologic    Sudden weakness in arms or legs:     Sudden numbness in arms or legs:     Sudden onset of difficulty speaking or slurred speech:    Temporary loss of vision in one eye:     Problems with dizziness:         Gastrointestinal    Blood in stool:     Vomited blood:         Genitourinary    Burning when urinating:     Blood in urine:        Psychiatric    Major depression:         Hematologic    Bleeding problems:    Problems with blood clotting too easily:        Skin    Rashes or ulcers:        Constitutional    Fever or chills:      PHYSICAL EXAM: There were no vitals filed for this visit.  GENERAL: The patient is a well-nourished female, in no acute distress. The vital signs are documented above. VASCULAR:  Right brachiocephalic AV fistula with palpable thrill Right radial pulse nonpalpable No right hand tissue loss PULMONARY: No respiratory distress. MUSCULOSKELETAL: There are no major deformities or cyanosis. NEUROLOGIC: No focal weakness or paresthesias are detected. PSYCHIATRIC: The patient has a normal affect.  DATA:   DIALYSIS ACCESS   Patient Name:  [name]  Date of Exam:   05/25/2022  Medical Rec #: [id number]      Accession #:    [id number]  Date of Birth: [birth year]      Patient Gender: F  Patient Age:   6 years  Exam Location:  [facility]  Procedure:      VAS US DUPLEX DIALYSIS ACCESS (AVF, AVG)  Referring Phys: Monica Martinez    ---------------------------------------------------------------------------  -----    Reason for Exam: Routine follow up.   Access  Site: Right Upper Extremity.   Access Type: Radial-cephalic AVF.   History: Created on 01/29/22.   Performing Technologist: Ralene Cork RVT     Examination Guidelines: A complete evaluation includes B-mode imaging,  spectral  Doppler, color  Doppler, and power Doppler as needed of all accessible  portions  of each vessel. Unilateral testing is considered an integral part of a  complete  examination. Limited examinations for reoccurring indications may be  performed  as noted.     Findings:  +--------------------+----------+-----------------+--------+  AVF                PSV (cm/s)Flow Vol (mL/min)Comments  +--------------------+----------+-----------------+--------+  Native artery inflow    82           237                 +--------------------+----------+-----------------+--------+  AVF Anastomosis        276                               +--------------------+----------+-----------------+--------+     +------------+----------+-------------+----------+-----------------------+  OUTFLOW VEINPSV (cm/s)Diameter (cm)Depth (cm)       Describe          +------------+----------+-------------+----------+-----------------------+  Prox UA        326        0.46        0.78                            +------------+----------+-------------+----------+-----------------------+  Mid UA         100        0.51        0.35                            +------------+----------+-------------+----------+-----------------------+  Dist UA         75        0.57        0.54   valve mid/dist 326 cm/s  +------------+----------+-------------+----------+-----------------------+  AC Fossa       514        0.63        0.34                            +------------+----------+-------------+----------+-----------------------+     Summary:  Patent BC AVF.  Low flow volume.  Retained valve in the mid/distal upper arm with elevated velocity.  Distal upper arm  velocity <100 cm/s.     *See table(s) above for measurements and observations.    Diagnosing physician: Monica Martinez MD  Electronically signed by Monica Martinez MD on 05/25/2022 at 8:25:13 AM.      Assessment/Plan:  82 year old female with end-stage renal disease that presents for evaluation of maturity of her right brachiocephalic AV fistula.  This was placed on 01/29/2022 by myself.  On exam she has a palpable thrill.  That being said, the daughter states it has not working in dialysis for a full session.  Duplex today suggest low flow volumes of 237 mL/min.  There also appears to be a retained valve with some stenosis.  I have recommended a right upper extremity fistulogram in the Cath Lab at [facility].  Risks benefits discussed.  Will get scheduled today at her convenience.  All questions answered.   Marty Heck, MD [facility] Office: [phone]

## 2022-05-26 ENCOUNTER — Telehealth: Payer: Self-pay

## 2022-05-26 NOTE — Telephone Encounter (Signed)
Attempted to reach patient to schedule right arm fistulogram, but no answer. Left voicemail message for patient to return call.

## 2022-05-27 ENCOUNTER — Encounter (HOSPITAL_COMMUNITY): Payer: Self-pay

## 2022-05-27 ENCOUNTER — Emergency Department (HOSPITAL_COMMUNITY)
Admission: EM | Admit: 2022-05-27 | Discharge: 2022-05-28 | Disposition: A | Discharge status: Home / Self Care | Payer: Medicare HMO | Attending: Emergency Medicine | Admitting: Emergency Medicine

## 2022-05-27 ENCOUNTER — Emergency Department (HOSPITAL_COMMUNITY): Payer: Medicare HMO

## 2022-05-27 ENCOUNTER — Other Ambulatory Visit: Payer: Self-pay

## 2022-05-27 DIAGNOSIS — N186 End stage renal disease: Secondary | ICD-10-CM | POA: Diagnosis not present

## 2022-05-27 DIAGNOSIS — E1122 Type 2 diabetes mellitus with diabetic chronic kidney disease: Secondary | ICD-10-CM | POA: Diagnosis not present

## 2022-05-27 DIAGNOSIS — I132 Hypertensive heart and chronic kidney disease with heart failure and with stage 5 chronic kidney disease, or end stage renal disease: Secondary | ICD-10-CM | POA: Insufficient documentation

## 2022-05-27 DIAGNOSIS — R109 Unspecified abdominal pain: Secondary | ICD-10-CM | POA: Diagnosis present

## 2022-05-27 DIAGNOSIS — Z7901 Long term (current) use of anticoagulants: Secondary | ICD-10-CM | POA: Insufficient documentation

## 2022-05-27 DIAGNOSIS — Z992 Dependence on renal dialysis: Secondary | ICD-10-CM | POA: Insufficient documentation

## 2022-05-27 DIAGNOSIS — I5030 Unspecified diastolic (congestive) heart failure: Secondary | ICD-10-CM | POA: Insufficient documentation

## 2022-05-27 DIAGNOSIS — Z794 Long term (current) use of insulin: Secondary | ICD-10-CM | POA: Diagnosis not present

## 2022-05-27 DIAGNOSIS — N12 Tubulo-interstitial nephritis, not specified as acute or chronic: Secondary | ICD-10-CM | POA: Diagnosis not present

## 2022-05-27 LAB — CBC WITH DIFFERENTIAL/PLATELET
Abs Immature Granulocytes: 0.03 10*3/uL (ref 0.00–0.07)
Basophils Absolute: 0 10*3/uL (ref 0.0–0.1)
Basophils Relative: 1 %
Eosinophils Absolute: 0.2 10*3/uL (ref 0.0–0.5)
Eosinophils Relative: 4 %
HCT: 38.7 % (ref 36.0–46.0)
Hemoglobin: 12.2 g/dL (ref 12.0–15.0)
Immature Granulocytes: 1 %
Lymphocytes Relative: 19 %
Lymphs Abs: 0.8 10*3/uL (ref 0.7–4.0)
MCH: 30.7 pg (ref 26.0–34.0)
MCHC: 31.5 g/dL (ref 30.0–36.0)
MCV: 97.5 fL (ref 80.0–100.0)
Monocytes Absolute: 0.6 10*3/uL (ref 0.1–1.0)
Monocytes Relative: 13 %
Neutro Abs: 2.6 10*3/uL (ref 1.7–7.7)
Neutrophils Relative %: 62 %
Platelets: 124 10*3/uL — ABNORMAL LOW (ref 150–400)
RBC: 3.97 MIL/uL (ref 3.87–5.11)
RDW: 14 % (ref 11.5–15.5)
WBC: 4.2 10*3/uL (ref 4.0–10.5)
nRBC: 0 % (ref 0.0–0.2)

## 2022-05-27 LAB — COMPREHENSIVE METABOLIC PANEL
ALT: 6 U/L (ref 0–44)
AST: 25 U/L (ref 15–41)
Albumin: 3.2 g/dL — ABNORMAL LOW (ref 3.5–5.0)
Alkaline Phosphatase: 69 U/L (ref 38–126)
Anion gap: 12 (ref 5–15)
BUN: <5 mg/dL — ABNORMAL LOW (ref 8–23)
CO2: 27 mmol/L (ref 22–32)
Calcium: 7.6 mg/dL — ABNORMAL LOW (ref 8.9–10.3)
Chloride: 96 mmol/L — ABNORMAL LOW (ref 98–111)
Creatinine, Ser: 1.23 mg/dL — ABNORMAL HIGH (ref 0.44–1.00)
GFR, Estimated: 44 mL/min — ABNORMAL LOW (ref >60)
Glucose, Bld: 108 mg/dL — ABNORMAL HIGH (ref 70–99)
Potassium: 3.7 mmol/L (ref 3.5–5.1)
Sodium: 135 mmol/L (ref 135–145)
Total Bilirubin: 1.1 mg/dL (ref 0.3–1.2)
Total Protein: 7.1 g/dL (ref 6.5–8.1)

## 2022-05-27 LAB — URINALYSIS, ROUTINE W REFLEX MICROSCOPIC
Bacteria, UA: NONE SEEN
Bilirubin Urine: NEGATIVE
Glucose, UA: NEGATIVE mg/dL
Ketones, ur: NEGATIVE mg/dL
Nitrite: NEGATIVE
Protein, ur: 100 mg/dL — AB
Specific Gravity, Urine: 1.018 (ref 1.005–1.030)
WBC, UA: >50 WBC/hpf (ref 0–5)
pH: 6 (ref 5.0–8.0)

## 2022-05-27 LAB — LIPASE, BLOOD: Lipase: 26 U/L (ref 11–51)

## 2022-05-27 LAB — LACTIC ACID, PLASMA: Lactic Acid, Venous: 1.9 mmol/L (ref 0.5–1.9)

## 2022-05-27 MED ORDER — LACTATED RINGERS IV BOLUS
300.0000 mL | Freq: Once | INTRAVENOUS | Status: AC
  Administered 2022-05-27: 300 mL via INTRAVENOUS

## 2022-05-27 MED ORDER — AMOXICILLIN-POT CLAVULANATE 875-125 MG PO TABS
1.0000 | ORAL_TABLET | Freq: Once | ORAL | Status: AC
  Administered 2022-05-28: 1 via ORAL
  Filled 2022-05-27: qty 1

## 2022-05-27 MED ORDER — AMOXICILLIN-POT CLAVULANATE 875-125 MG PO TABS: 1.0000 | ORAL_TABLET | Freq: Two times a day (BID) | ORAL | 0 refills | Status: AC

## 2022-05-27 NOTE — Discharge Instructions (Addendum)
Thank you for coming to [facility] Emergency Department. You were seen for abdominal and back pain that resolved. We did an exam, labs, and imaging, and these showed a urinary tract infection, most likely pyelonephritis, or an infection in the kidney. We have prescribed augmentin to take twice per day for 10 days.  Please follow up with your primary care provider within 1 week.   Do not hesitate to return to the ED or call 911 if you experience: -Worsening symptoms -Lightheadedness, passing out -Fevers/chills -Anything else that concerns you

## 2022-05-27 NOTE — ED Provider Notes (Signed)
[name] Provider Note   CSN: [id number] Arrival date & time: 05/27/22  1712     History  Chief Complaint  Patient presents with   Back Pain   Abdominal Pain    [name] is a 82 y.o. female with HTN, HLD, T2DM, persistent A-fib, end-stage renal disease with HD TuThSat, diastolic heart failure, lower extremity edema, abnormal gait, prolonged QT who presents with hypotension, abdominal pain.   Pt BIBEMS from [place] dialysis center. EMS was called out for lower back pain and generalized abd pain that occurred during dialysis. Patient was able to finish her dialysis session however patient was noted to be hypotensive on arrival to 80/40. Patient now states that she feels in her normal state of health.  She had back pain during dialysis but does not have it anymore.  She denies any illnesses, fever/chills, urinary symptoms, changes in bowel habits, lower extremity edema, SOB/CP/N/V/D. Patient states that her blood pressure is sometimes low during dialysis. She does take a blood thinner eliquis for afib. She does feel mildly lightheaded now but otherwise feels in her [place]. Patient's CBG 117 mg/dL with EMS, she received 200 mL NS.   HPI     Home Medications Prior to Admission medications   Medication Sig Start Date End Date Taking? Authorizing Provider  Accu-Chek FastClix Lancets MISC See admin instructions. 01/16/19   [provider]  apixaban (ELIQUIS) 2.5 MG TABS tablet Take 1 tablet (2.5 mg total) by mouth 2 (two) times daily. 03/01/22   Ernst Spell, NP  BD PEN NEEDLE NANO 2ND GEN 32G X 4 MM MISC 3 (three) times daily. as directed 08/13/19   [provider]  Blood Glucose Monitoring Suppl (ACCU-CHEK GUIDE ME) w/Device KIT See admin instructions. 12/11/18   [provider]  HUMULIN 70/30 KWIKPEN (70-30) 100 UNIT/ML KwikPen Inject 5 Units into the skin daily. Patient taking differently: Inject 10 Units into  the skin at bedtime. 05/28/21   Regalado, Cassie Freer, MD  Hydrocortisone (0000000 EX) Apply 1 application. topically 3 (three) times daily as needed (itching).    [provider]      Allergies    Amlodipine    Review of Systems   Review of Systems Review of systems Negative for f/c.  A 10 point review of systems was performed and is negative unless otherwise reported in HPI.  Physical Exam Updated Vital Signs BP 103/81 (BP Location: Right Arm)   Pulse 83   Temp 97.8 F (36.6 C) (Oral)   Resp 16   Ht 5' 5"$  (1.651 m)   Wt 60 kg   SpO2 97%   BMI 22.01 kg/m  Physical Exam General: Normal appearing female, lying in bed.  HEENT: PERRLA, EOMI, Sclera anicteric, MMM, trachea midline.  Cardiology: Irregular rhythm, normal rate, no murmurs/rubs/gallops. BL radial and DP pulses equal bilaterally.  Resp: Normal respiratory rate and effort. CTAB, no wheezes, rhonchi, crackles.  Abd: Soft, non-tender, non-distended. No rebound tenderness or guarding.  GU: Deferred. MSK: No peripheral edema or signs of trauma. Extremities without deformity or TTP. No cyanosis or clubbing. Skin: warm, dry. No rashes or lesions. Back: No CVA tenderness, no midline C T or L spine tenderness, no paraspinal muscle tenderness. Neuro: A&Ox4, CNs II-XII grossly intact. MAEs. Sensation grossly intact.  Psych: Normal mood and affect.   ED Results / Procedures / Treatments   Labs (all labs ordered are listed, but only abnormal results are displayed) Labs Reviewed  CBC WITH DIFFERENTIAL/PLATELET - Abnormal; Notable for the following components:      Result Value   Platelets 124 (*)    All other components within normal limits  COMPREHENSIVE METABOLIC PANEL - Abnormal; Notable for the following components:   Chloride 96 (*)    Glucose, Bld 108 (*)    BUN <5 (*)    Creatinine, Ser 1.23 (*)    Calcium 7.6 (*)    Albumin 3.2 (*)    GFR, Estimated 44 (*)    All other components within normal limits   URINALYSIS, ROUTINE W REFLEX MICROSCOPIC - Abnormal; Notable for the following components:   Color, Urine AMBER (*)    APPearance HAZY (*)    Hgb urine dipstick SMALL (*)    Protein, ur 100 (*)    Leukocytes,Ua MODERATE (*)    All other components within normal limits  LACTIC ACID, PLASMA  LIPASE, BLOOD    EKG EKG Interpretation  Date/Time:  Thursday May 27 2022 17:35:48 EST Ventricular Rate:  78 PR Interval:    QRS Duration: 133 QT Interval:  468 QTC Calculation: 534 R Axis:   261 Text Interpretation: Atrial fibrillation RBBB and LAFB Confirmed by Cindee Lame [phone]) on 05/27/2022 8:15:05 PM  Radiology CT abd pelvis wo contrast: IMPRESSION: 1. Cholelithiasis with questionable gallbladder wall thickening. Correlate clinically for cholecystitis. 2. Moderate amount of free fluid in the pelvis. 3. Small right pleural effusion. 4. Stable hepatomegaly.  Procedures Procedures    Medications Ordered in ED Medications  lactated ringers bolus 300 mL (0 mLs Intravenous Stopped 05/27/22 1905)  amoxicillin-clavulanate (AUGMENTIN) 875-125 MG per tablet 1 tablet (1 tablet Oral Given 05/28/22 0013)    ED Course/ Medical Decision Making/ A&P                          Medical Decision Making Amount and/or Complexity of Data Reviewed Labs: ordered. Decision-making details documented in ED Course. Radiology: ordered. Decision-making details documented in ED Course.  Risk Prescription drug management.    This patient presents to the ED for concern of abdominal pain, back pain, hypotension; this involves an extensive number of treatment options, and is a complaint that carries with it a high risk of complications and morbidity.  I considered the following differential and admission for this acute, potentially life threatening condition.   MDM:    Patient is currently HDS with BP in 120s on arrival after just 200 cc of fluid. Patient states that sometimes her BP is low during  or after dialysis. She did complete her session so given that she is HDS now after a little fluid her likely cause is hypovolemia. She still feels mildly lightheaded so will complete a 500 cc bolus with the remaining 300 cc bolus. Patient's back and abdominal pain, she has no tenderness to palpation on back or abdomen, no signs of acute abdomen. Given her hypotension/abdominal back pain though, must consider possible acute life threatening conditions such as ruptured AAA, aortic dissection, sepsis from intraabdominal infection such as pyelonephritis/UTI, appendictis/diverticulitis/cholecystitis. Patient has no pain currently and appears stable/well, low c/f life threatening condition at this time. Will obtain labs/CT imaging.   Clinical Course as of 06/12/22 1657  Thu May 27, 2022  1917 CT ABDOMEN PELVIS WO CONTRAST 1. Cholelithiasis with questionable gallbladder wall thickening. Correlate clinically for cholecystitis. 2. Moderate amount of free fluid in the pelvis. 3. Small right pleural effusion. 4. Stable hepatomegaly.   [HN]  1918  CBC with Differential(!) Unremarkable. Stable thrombocytopenia. [HN]  1918 Comprehensive metabolic panel(!) Unremarkable [HN]  1918 Lipase: 26 [HN]  1918 Lactic Acid, Venous: 1.9 [HN]  2014 BP: 128/71 [HN]  2034 Patient has no pain in RUQ, no abdominal pain currently. No TTP in abdomen, no TTP in ruQ, negative murphy's sign. Low c/f cholecystitis at this time. Patient does not make much urine and has not been able to yet give a urine sample. Will straight cath for sample. [HN]  2123 Urinalysis, Routine w reflex microscopic -Urine, Catheterized(!) Cathed urine specimen. Pt with likely pyelonephritis. [HN]  2124 BP(!): 142/90 BP has been stable now for several hours and she is actually mildly hypertensive at this time. No fever or chills, no tachycardia, low c/f sepsis. Urine culture added on. [HN]    Clinical Course User Index [HN] Audley Hose, MD     Labs: I Ordered, and personally interpreted labs.  The pertinent results include:  those listed above  Imaging Studies ordered: I ordered imaging studies including CT abd pelvis w/o contrast I independently visualized and interpreted imaging. I agree with the radiologist interpretation  Additional history obtained from chart review, daughter at bedside.    Cardiac Monitoring: The patient was maintained on a cardiac monitor.  I personally viewed and interpreted the cardiac monitored which showed an underlying rhythm of: rate controlled afib  Reevaluation: After the interventions noted above, I reevaluated the patient and found that they have :resolved  Social Determinants of Health: Patient lives independently   Disposition: Patient with likely pyelonephritis. D/w pharmacy about antibiotic treatment who recommends cefpodoxime. D/w patient and daughter return precautions/discharge instructions, PCP f/u.  Co morbidities that complicate the patient evaluation  Past Medical History:  Diagnosis Date   CHF (congestive heart failure) (HCC)    Chronic kidney disease    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   Diabetes mellitus without complication (HCC)    type 2   Dyspnea    with exertion   Hypertension    Neuromuscular disorder (HCC)    neuropathy bil feet   PAF (paroxysmal atrial fibrillation) ([place]) 08/2020   Pneumonia    Pulmonary hypertension (HCC)      Medicines Meds ordered this encounter  Medications   lactated ringers bolus 300 mL   amoxicillin-clavulanate (AUGMENTIN) 875-125 MG per tablet 1 tablet   amoxicillin-clavulanate (AUGMENTIN) 875-125 MG tablet    Sig: Take 1 tablet by mouth every 12 (twelve) hours for 10 days.    Dispense:  20 tablet    Refill:  0    I have reviewed the patients home medicines and have made adjustments as needed  Problem List / ED Course: Problem List Items Addressed This Visit   None Visit Diagnoses     Pyelonephritis    -  Primary                    This note was created using dictation software, which may contain spelling or grammatical errors.    Audley Hose, MD 06/12/22 [phone]

## 2022-05-27 NOTE — ED Triage Notes (Signed)
Pt BIBA from [place] dialysis center. EMS called out for back pain and abd pain. Dialysis completed. Staff called POA and was told to bring to ED for eval. Pt Hypotensive in wheelchair in lobby at facility when EMS arrived on scene. Pt denies SOB/CP/N/V/D. Dialysis T/TH/SAT.  248ml NS  BP-80/40 HR-60-80 A-FIB CBG-117 Sp O2 98% RA

## 2022-05-28 ENCOUNTER — Other Ambulatory Visit: Payer: Self-pay

## 2022-05-28 DIAGNOSIS — N186 End stage renal disease: Secondary | ICD-10-CM

## 2022-05-28 MED ORDER — SODIUM CHLORIDE 0.9 % IV SOLN: 250.0000 mL | INTRAVENOUS | Status: DC | PRN

## 2022-05-28 MED ORDER — SODIUM CHLORIDE 0.9% FLUSH: 3.0000 mL | Freq: Two times a day (BID) | INTRAVENOUS | Status: DC

## 2022-05-28 NOTE — Telephone Encounter (Signed)
Spoke with patient's daughter, Lynelle Smoke. Patient scheduled for fistulogram on 06/07/22, non-HD day with Dr. Donzetta Matters. Instructions provided and she verbalized understanding.

## 2022-06-04 ENCOUNTER — Telehealth: Payer: Self-pay

## 2022-06-04 NOTE — Telephone Encounter (Signed)
Patient's daughter, Jeanene Erb called to reschedule fistulogram because of conflicting appointments. Procedure rescheduled for 06/16/22 as requested. [name] denied patient taking Eliquis. Instructions reviewed and she verbalized understanding.

## 2022-06-05 ENCOUNTER — Emergency Department (HOSPITAL_COMMUNITY)
Admission: EM | Admit: 2022-06-05 | Discharge: 2022-06-05 | Disposition: A | Discharge status: Home / Self Care | Payer: Medicare HMO | Attending: Emergency Medicine | Admitting: Emergency Medicine

## 2022-06-05 ENCOUNTER — Emergency Department (HOSPITAL_COMMUNITY): Payer: Medicare HMO

## 2022-06-05 ENCOUNTER — Encounter (HOSPITAL_COMMUNITY): Payer: Self-pay

## 2022-06-05 ENCOUNTER — Other Ambulatory Visit: Payer: Self-pay

## 2022-06-05 DIAGNOSIS — W19XXXA Unspecified fall, initial encounter: Secondary | ICD-10-CM | POA: Diagnosis not present

## 2022-06-05 DIAGNOSIS — R159 Full incontinence of feces: Secondary | ICD-10-CM | POA: Insufficient documentation

## 2022-06-05 DIAGNOSIS — S0993XA Unspecified injury of face, initial encounter: Secondary | ICD-10-CM | POA: Diagnosis present

## 2022-06-05 DIAGNOSIS — Z794 Long term (current) use of insulin: Secondary | ICD-10-CM | POA: Diagnosis not present

## 2022-06-05 DIAGNOSIS — Z7901 Long term (current) use of anticoagulants: Secondary | ICD-10-CM | POA: Insufficient documentation

## 2022-06-05 DIAGNOSIS — S0083XA Contusion of other part of head, initial encounter: Secondary | ICD-10-CM | POA: Insufficient documentation

## 2022-06-05 NOTE — ED Triage Notes (Signed)
Patient arrived by EMS from dialysis center after falling out of WC. Patient has small hematoma to right upper forehead. No loc. No thinners. Patient did not receive any dialysis because she had incontinence and they do not clean patients and sent her out

## 2022-06-05 NOTE — ED Notes (Signed)
AVS reviewed with pt prior to discharge. Pt verbalizes understanding. Belongings with pt upon depart. Pt taken in wheelchair to daughter's car for ride home.

## 2022-06-05 NOTE — Discharge Instructions (Addendum)
You were evaluated today after a fall.  You have a small hematoma on the forehead. This should reabsorb over time. Please follow up as needed with your primary care provider.

## 2022-06-05 NOTE — ED Notes (Signed)
Patient transported to CT 

## 2022-06-05 NOTE — ED Notes (Signed)
Pt's daughter called at this time. Daughter on the way to pick up pt.

## 2022-06-05 NOTE — ED Provider Notes (Signed)
[facility] Provider Note   CSN: [id number] Arrival date & time: 06/05/22  1551     History  Chief Complaint  Patient presents with   Lytle Michaels    [name] is a 82 y.o. female.  Patient presents to the emergency department via EMS complaining of hematoma to the right forehead secondary to a fall.  Patient was at dialysis this morning and had an episode of fecal incontinence.  Dialysis staff moved her to her wheelchair.  The patient was leaning forward to try to clean herself when she slipped out of the wheelchair, falling forward and hitting her head on the floor.  She denies losing consciousness.  She no longer uses blood thinners.  Past medical history significa end-stage renal disease on dialysis nt hypertension, heart failure, type II DM, mixed incontinence,  HPI     Home Medications Prior to Admission medications   Medication Sig Start Date End Date Taking? Authorizing Provider  Accu-Chek FastClix Lancets MISC See admin instructions. 01/16/19   [provider]  amoxicillin-clavulanate (AUGMENTIN) 875-125 MG tablet Take 1 tablet by mouth every 12 (twelve) hours for 10 days. 05/27/22 06/06/22  Audley Hose, MD  apixaban (ELIQUIS) 2.5 MG TABS tablet Take 1 tablet (2.5 mg total) by mouth 2 (two) times daily. 03/01/22   Ernst Spell, NP  BD PEN NEEDLE NANO 2ND GEN 32G X 4 MM MISC 3 (three) times daily. as directed 08/13/19   [provider]  Blood Glucose Monitoring Suppl (ACCU-CHEK GUIDE ME) w/Device KIT See admin instructions. 12/11/18   [provider]  HUMULIN 70/30 KWIKPEN (70-30) 100 UNIT/ML KwikPen Inject 5 Units into the skin daily. Patient taking differently: Inject 10 Units into the skin at bedtime. 05/28/21   Regalado, Cassie Freer, MD  Hydrocortisone (JIRCVELFY-10 EX) Apply 1 application. topically 3 (three) times daily as needed (itching).    [provider]      Allergies    Amlodipine     Review of Systems   Review of Systems  Skin:        Hematoma    Physical Exam Updated Vital Signs BP (!) 145/72 (BP Location: Left Arm)   Pulse (!) 56   Temp 98 F (36.7 C) (Oral)   Resp 16   SpO2 100%  Physical Exam HENT:     Head: Normocephalic and atraumatic.  Eyes:     Extraocular Movements: Extraocular movements intact.     Conjunctiva/sclera: Conjunctivae normal.     Pupils: Pupils are equal, round, and reactive to light.  Cardiovascular:     Rate and Rhythm: Normal rate and regular rhythm.  Pulmonary:     Effort: Pulmonary effort is normal. No respiratory distress.     Breath sounds: Normal breath sounds.  Musculoskeletal:        General: No signs of injury.     Cervical back: Normal range of motion.  Skin:    General: Skin is dry.     Comments: Very small hematoma noted to right forehead.  Neurological:     General: No focal deficit present.     Mental Status: She is alert.     Motor: No weakness.  Psychiatric:        Speech: Speech normal.        Behavior: Behavior normal.     ED Results / Procedures / Treatments   Labs (all labs ordered are listed, but only abnormal results are displayed) Labs Reviewed - No  data to display  EKG None  Radiology CT Head Wo Contrast  Result Date: 06/05/2022 CLINICAL DATA:  Fall, head trauma EXAM: CT HEAD WITHOUT CONTRAST TECHNIQUE: Contiguous axial images were obtained from the base of the skull through the vertex without intravenous contrast. RADIATION DOSE REDUCTION: This exam was performed according to the departmental dose-optimization program which includes automated exposure control, adjustment of the mA and/or kV according to patient size and/or use of iterative reconstruction technique. COMPARISON:  04/03/2022 FINDINGS: Brain: No evidence of acute infarction, hemorrhage, hydrocephalus, extra-axial collection or mass lesion/mass effect. Extensive periventricular and deep white matter hypodensity. Vascular: No  hyperdense vessel or unexpected calcification. Skull: Normal. Negative for fracture or focal lesion. Sinuses/Orbits: No acute finding. Other: None. IMPRESSION: No acute intracranial pathology. Advanced small-vessel white matter disease. Electronically Signed   By: Delanna Ahmadi M.D.   On: 06/05/2022 17:14    Procedures Procedures    Medications Ordered in ED Medications - No data to display  ED Course/ Medical Decision Making/ A&P                             Medical Decision Making Amount and/or Complexity of Data Reviewed Radiology: ordered.   This patient presents to the ED for concern of head injury secondary to a fall, this involves an extensive number of treatment options, and is a complaint that carries with it a high risk of complications and morbidity.  The differential diagnosis includes intracranial abnormality, soft tissue injury, others   Co morbidities that complicate the patient evaluation  End-stage renal disease, previous CVA   Additional history obtained:  Additional history obtained from EMS External records from outside source obtained and reviewed including emergency department notes from January 25 when the patient was diagnosed with pyelonephritis   Lab Tests:  There is no indication for lab work at this time   Imaging Studies ordered:  I ordered imaging studies including CT head without contrast I independently visualized and interpreted imaging which showed no acute findings.  Small vessel white matter disease I agree with the radiologist interpretation   Social Determinants of Health:  Patient is a dialysis patient   Test / Admission - Considered:  CT head is unremarkable.  Very small hematoma noted.  There is no indication at this time for further emergent workup.  The patient has no neck tenderness to require a CT scan of the neck at this time.  She may discharge home and follow-up as needed with her primary care team        Final  Clinical Impression(s) / ED Diagnoses Final diagnoses:  Fall, initial encounter  Traumatic hematoma of forehead, initial encounter    Rx / DC Orders ED Discharge Orders     None         Ronny Bacon 06/05/22 1741    Regan Lemming, MD 06/07/22 1511

## 2022-06-05 NOTE — ED Notes (Signed)
Help get patient cleaned up placed another brief got patient some warm blankets [place] [address]

## 2022-06-07 ENCOUNTER — Encounter (HOSPITAL_BASED_OUTPATIENT_CLINIC_OR_DEPARTMENT_OTHER): Payer: Medicare HMO | Attending: General Surgery | Admitting: General Surgery

## 2022-06-07 DIAGNOSIS — Z992 Dependence on renal dialysis: Secondary | ICD-10-CM | POA: Insufficient documentation

## 2022-06-07 DIAGNOSIS — E1142 Type 2 diabetes mellitus with diabetic polyneuropathy: Secondary | ICD-10-CM | POA: Diagnosis not present

## 2022-06-07 DIAGNOSIS — L97812 Non-pressure chronic ulcer of other part of right lower leg with fat layer exposed: Secondary | ICD-10-CM | POA: Insufficient documentation

## 2022-06-07 DIAGNOSIS — E1122 Type 2 diabetes mellitus with diabetic chronic kidney disease: Secondary | ICD-10-CM | POA: Insufficient documentation

## 2022-06-07 DIAGNOSIS — I509 Heart failure, unspecified: Secondary | ICD-10-CM | POA: Insufficient documentation

## 2022-06-07 DIAGNOSIS — N186 End stage renal disease: Secondary | ICD-10-CM | POA: Diagnosis not present

## 2022-06-07 DIAGNOSIS — E440 Moderate protein-calorie malnutrition: Secondary | ICD-10-CM | POA: Diagnosis not present

## 2022-06-07 DIAGNOSIS — I872 Venous insufficiency (chronic) (peripheral): Secondary | ICD-10-CM | POA: Diagnosis present

## 2022-06-07 DIAGNOSIS — I132 Hypertensive heart and chronic kidney disease with heart failure and with stage 5 chronic kidney disease, or end stage renal disease: Secondary | ICD-10-CM | POA: Diagnosis not present

## 2022-06-07 NOTE — Progress Notes (Signed)
[name], [name] ([id number]) [id number] Page 1 of 7 Visit Report for 06/07/2022 Arrival Information Details Patient Name: Date of Service: [name], [name] 06/07/2022 11:00 A M Medical Record Number: [id number] Patient Account Number: [id number] Date of Birth/Sex: Treating RN: May 27, 1940 (82 y.o. [name] Primary Care Joclynn Lumb: [name] Number Other Clinician: Referring Ruthe Roemer: Treating Starlit Raburn/Extender: [name] in Treatment: 4 Visit Information History Since Last Visit Added or deleted any medications: No Patient Arrived: Wheel Chair Any new allergies or adverse reactions: No Arrival Time: 11:10 Had a fall or experienced change in No Accompanied By: daughter activities of daily living that may affect Transfer Assistance: Manual risk of falls: Patient Identification Verified: Yes Signs or symptoms of abuse/neglect since last visito No Hospitalized since last visit: No Implantable device outside of the clinic excluding No cellular tissue based products placed in the center since last visit: Pain Present Now: Yes Electronic Signature(s) Signed: 06/07/2022 12:23:27 PM By: [name] RN Entered By: [name] on 06/07/2022 11:53:09 -------------------------------------------------------------------------------- Compression Therapy Details Patient Name: Date of Service: [name], [name] 06/07/2022 11:00 A M Medical Record Number: [id number] Patient Account Number: [id number] Date of Birth/Sex: Treating RN: 1940/11/26 (82 y.o. [name] Primary Care Terease Marcotte: [name] Number Other Clinician: Referring Tevan Marian: Treating Perris Conwell/Extender: [name] in Treatment: 4 Compression Therapy Performed for Wound Assessment: Wound #5 Right,Anterior Lower Leg Performed By: Clinician [name], RN Compression Type: Three Layer Post Procedure Diagnosis Same as  Pre-procedure Electronic Signature(s) Signed: 06/07/2022 12:23:27 PM By: [name] RN Entered By: [name] on 06/07/2022 11:55:29 Encounter Discharge Information Details -------------------------------------------------------------------------------- [name] ([id number]) [id number] Page 2 of 7 Patient Name: Date of Service: [name], [name] 06/07/2022 11:00 [place] Record Number: [id number] Patient Account Number: [id number] Date of Birth/Sex: Treating RN: 05/09/1940 (82 y.o. [name] Primary Care Eppie Barhorst: [name] Number Other Clinician: Referring Izzak Fries: Treating Leary Mcnulty/Extender: [name] in Treatment: 4 Encounter Discharge Information Items Post Procedure Vitals Discharge Condition: Stable Temperature (F): 97.3 Ambulatory Status: Wheelchair Pulse (bpm): 70 Discharge Destination: Home Respiratory Rate (breaths/min): 16 Transportation: Private Auto Blood Pressure (mmHg): 113/71 Accompanied By: Daughter Schedule Follow-up Appointment: Yes Clinical Summary of Care: Patient Declined Electronic Signature(s) Signed: 06/07/2022 12:23:27 PM By: [name] RN Entered By: [name] on 06/07/2022 12:22:44 -------------------------------------------------------------------------------- Lower Extremity Assessment Details Patient Name: Date of Service: [name], [name] 06/07/2022 11:00 A M Medical Record Number: [id number] Patient Account Number: [id number] Date of Birth/Sex: Treating RN: 1940-09-08 (82 y.o. [name] Primary Care Raymonde Hamblin: [name] Number Other Clinician: Referring Kenly Henckel: Treating Uel Davidow/Extender: [name] in Treatment: 4 Edema Assessment Assessed: Shirlyn Goltz: No] Patrice Paradise: No] [Left: Edema] [Right: :] Calf Left: Right: Point of Measurement: 33 cm From Medial Instep 32 cm 31.9 cm Ankle Left: Right: Point of Measurement: 9 cm  From Medial Instep 20 cm 19.9 cm Vascular Assessment Pulses: Dorsalis Pedis Palpable: [Right:Yes] Electronic Signature(s) Signed: 06/07/2022 12:23:27 PM By: [name] RN Entered By: [name] on 06/07/2022 11:54:55 -------------------------------------------------------------------------------- Multi Wound Chart Details Patient Name: Date of Service: [name] 06/07/2022 11:00 Margaret Record Number: [id number] Patient Account Number: [id number] [name], [name] ([id number]) [phone].pdf Page 3 of 7 Date of Birth/Sex: Treating RN: [birth year] (82 y.o. F) Primary Care Aleesa Sweigert: Other Clinician: [name] Number Referring Masiah Woody: Treating Mackensie Pilson/Extender: [name] in Treatment: 4 Vital Signs Height(in): 63 Pulse(bpm): 70 Weight(lbs): 008 Blood Pressure(mmHg): 113/71 Body Mass Index(BMI): 21.8  Temperature(F): 97.3 Respiratory Rate(breaths/min): 16 [5:Photos:] [N/A:N/A] Right, Anterior Lower Leg N/A N/A Wound Location: Blister N/A N/A Wounding Event: Venous Leg Ulcer N/A N/A Primary Etiology: Cataracts, Arrhythmia, Congestive N/A N/A Comorbid History: Heart Failure, Hypertension, Type II Diabetes, End Stage Renal Disease, Neuropathy 04/30/2022 N/A N/A Date Acquired: 4 N/A N/A Weeks of Treatment: Open N/A N/A Wound Status: No N/A N/A Wound Recurrence: 1x1x0.1 N/A N/A Measurements L x W x D (cm) 0.785 N/A N/A A (cm) : rea 0.079 N/A N/A Volume (cm) : 70.30% N/A N/A % Reduction in A rea: 70.10% N/A N/A % Reduction in Volume: Full Thickness Without Exposed N/A N/A Classification: Support Structures Medium N/A N/A Exudate A mount: Serous N/A N/A Exudate Type: amber N/A N/A Exudate Color: Large (67-100%) N/A N/A Granulation A mount: Pink N/A N/A Granulation Quality: Small (1-33%) N/A N/A Necrotic A mount: Fat Layer (Subcutaneous Tissue): Yes N/A N/A Exposed  Structures: Fascia: No Tendon: No Muscle: No Joint: No Bone: No None N/A N/A Epithelialization: Debridement - Selective/Open Wound N/A N/A Debridement: Pre-procedure Verification/Time Out 11:55 N/A N/A Taken: Lidocaine 5% topical ointment N/A N/A Pain Control: Necrotic/Eschar N/A N/A Tissue Debrided: Non-Viable Tissue N/A N/A Level: 1 N/A N/A Debridement A (sq cm): rea Curette N/A N/A Instrument: Minimum N/A N/A Bleeding: Pressure N/A N/A Hemostasis A chieved: 0 N/A N/A Procedural Pain: 0 N/A N/A Post Procedural Pain: Procedure was tolerated well N/A N/A Debridement Treatment Response: 1x1x0.1 N/A N/A Post Debridement Measurements L x W x D (cm) 0.079 N/A N/A Post Debridement Volume: (cm) Scarring: Yes N/A N/A Periwound Skin Texture: Dry/Scaly: Yes N/A N/A Periwound Skin Moisture: Hemosiderin Staining: Yes N/A N/A Periwound Skin Color: No Abnormality N/A N/A Temperature: Compression Therapy N/A N/A Procedures Performed: Debridement Treatment Notes TIKISHA, MOLINARO ([id number]) [id number] Page 4 of 7 Electronic Signature(s) Signed: 06/07/2022 11:56:47 AM By: [name] MD FACS Entered By: [name] on 06/07/2022 11:56:47 -------------------------------------------------------------------------------- Multi-Disciplinary Care Plan Details Patient Name: Date of Service: [name], [name] 06/07/2022 11:00 A M Medical Record Number: [id number] Patient Account Number: [id number] Date of Birth/Sex: Treating RN: [birth year] (82 y.o. [name] Primary Care Murriel Eidem: [name] Number Other Clinician: Referring Macai Sisneros: Treating Nekeshia Lenhardt/Extender: [name] in Treatment: 4 Active Inactive Wound/Skin Impairment Nursing Diagnoses: Knowledge deficit related to ulceration/compromised skin integrity Goals: Patient/caregiver will verbalize understanding of skin care regimen Date Initiated:  05/07/2022 Target Resolution Date: 08/01/2022 Goal Status: Active Interventions: Assess ulceration(s) every visit Treatment Activities: Skin care regimen initiated : 05/07/2022 Notes: Electronic Signature(s) Signed: 06/07/2022 12:23:27 PM By: [name] RN Entered By: [name] on 06/07/2022 12:21:25 -------------------------------------------------------------------------------- Pain Assessment Details Patient Name: Date of Service: [name], [name] 06/07/2022 11:00 A M Medical Record Number: [id number] Patient Account Number: [id number] Date of Birth/Sex: Treating RN: 1941/02/15 (82 y.o. [name] Primary Care Zyon Grout: [name] Number Other Clinician: Referring Obi Scrima: Treating Kazimierz Springborn/Extender: [name] in Treatment: 4 Active Problems Location of Pain Severity and Description of Pain Patient Has Paino Yes Site Locations Pain Location: DORTHEA, MAINA ([id number]) [id number] Page 5 of 7 Pain Location: Generalized Pain With Dressing Change: Yes Duration of the Pain. Constant / Intermittento Constant Rate the pain. Current Pain Level: 1 Worst Pain Level: 10 Least Pain Level: 1 Tolerable Pain Level: 1 Character of Pain Describe the Pain: Difficult to Pinpoint Pain Management and Medication Current Pain Management: Medication: Yes Cold Application: No Rest: Yes Massage: No Activity: No T.E.N.S.: No Heat Application: No Leg drop or elevation: No  Is the Current Pain Management Adequate: Adequate Electronic Signature(s) Signed: 06/07/2022 12:23:27 PM By: [name] RN Entered By: [name] on 06/07/2022 11:54:46 -------------------------------------------------------------------------------- Patient/Caregiver Education Details Patient Name: Date of Service: [name] 2/5/2024andnbsp11:00 A M Medical Record Number: [id number] Patient Account Number: [id number] Date of  Birth/Gender: Treating RN: 11/21/1940 (82 y.o. [name] Primary Care Physician: [name] Number Other Clinician: Referring Physician: Treating Physician/Extender: [name] in Treatment: 4 Education Assessment Education Provided To: Patient Education Topics Provided Wound/Skin Impairment: Methods: Explain/Verbal Responses: Return demonstration correctly Electronic Signature(s) Signed: 06/07/2022 12:23:27 PM By: [name] RN Entered By: [name] on 06/07/2022 12:21:44 [name] ([id number]) [id number] Page 6 of 7 -------------------------------------------------------------------------------- Wound Assessment Details Patient Name: Date of Service: [name], [name] 06/07/2022 11:00 A M Medical Record Number: [id number] Patient Account Number: [id number] Date of Birth/Sex: Treating RN: 07/14/1940 (82 y.o. [name] Primary Care Audianna Landgren: [name] Number Other Clinician: Referring Ragnar Waas: Treating Oscar Forman/Extender: [name] in Treatment: 4 Wound Status Wound Number: 5 Primary Venous Leg Ulcer Etiology: Wound Location: Right, Anterior Lower Leg Wound Open Wounding Event: Blister Status: Date Acquired: 04/30/2022 Comorbid Cataracts, Arrhythmia, Congestive Heart Failure, Hypertension, Weeks Of Treatment: 4 History: Type II Diabetes, End Stage Renal Disease, Neuropathy Clustered Wound: No Photos Wound Measurements Length: (cm) 1 Width: (cm) 1 Depth: (cm) 0.1 Area: (cm) 0.785 Volume: (cm) 0.079 % Reduction in Area: 70.3% % Reduction in Volume: 70.1% Epithelialization: None Wound Description Classification: Full Thickness Without Exposed Support Structures Exudate Amount: Medium Exudate Type: Serous Exudate Color: amber Foul Odor After Cleansing: No Slough/Fibrino Yes Wound Bed Granulation Amount: Large (67-100%) Exposed Structure Granulation  Quality: Pink Fascia Exposed: No Necrotic Amount: Small (1-33%) Fat Layer (Subcutaneous Tissue) Exposed: Yes Necrotic Quality: Adherent Slough Tendon Exposed: No Muscle Exposed: No Joint Exposed: No Bone Exposed: No Periwound Skin Texture Texture Color No Abnormalities Noted: No No Abnormalities Noted: No Scarring: Yes Hemosiderin Staining: Yes Moisture Temperature / Pain No Abnormalities Noted: No Temperature: No Abnormality Dry / Scaly: Yes Treatment Notes Wound #5 (Lower Leg) Wound Laterality: Right, Anterior Cleanser Soap and Water Discharge Instruction: May shower and wash wound with dial antibacterial soap and water prior to dressing change. Wound Cleanser Discharge Instruction: Cleanse the wound with wound cleanser prior to applying a clean dressing using gauze sponges, not tissue or cotton balls. Peri-Wound Care BERNARDINA, CACHO ([id number]) [id number] Page 7 of 7 Topical Primary Dressing [employer] Dressing 2x2 (in/in) Discharge Instruction: Apply to wound bed as instructed Secondary Dressing Secured With Transpore Surgical Tape, 2x10 (in/yd) Discharge Instruction: Secure dressing with tape as directed. Compression Wrap ThreePress (3 layer compression wrap) Discharge Instruction: Apply three layer compression as directed. Netting, Tubular #5 Compression Stockings Add-Ons Electronic Signature(s) Signed: 06/07/2022 12:23:27 PM By: [name] RN Entered By: [name] on 06/07/2022 11:24:31 -------------------------------------------------------------------------------- Vitals Details Patient Name: Date of Service: [name], [name] 06/07/2022 11:00 A M Medical Record Number: [id number] Patient Account Number: [id number] Date of Birth/Sex: Treating RN: 07-04-1940 (82 y.o. [name] Primary Care Westyn Driggers: [name] Number Other Clinician: Referring Camia Dipinto: Treating Laiah Pouncey/Extender: [name] in Treatment: 4 Vital Signs Time Taken: 11:30 Temperature (F): 97.3 Height (in): 63 Pulse (bpm): 70 Weight (lbs): 123 Respiratory Rate (breaths/min): 16 Body Mass Index (BMI): 21.8 Blood Pressure (mmHg): 113/71 Reference Range: 80 - 120 mg / dl Electronic Signature(s) Signed: 06/07/2022 12:23:27 PM By: [name] RN Entered By: [name] on 06/07/2022 11:53:44

## 2022-06-07 NOTE — Progress Notes (Addendum)
THEO, REITHER ([id number]) [id number] Page 1 of 9 Visit Report for 06/07/2022 Chief Complaint Document Details Patient Name: Date of Service: [name], [name] 06/07/2022 11:00 A M Medical Record Number: [id number] Patient Account Number: [id number] Date of Birth/Sex: Treating RN: Oct 21, 1940 (82 y.o. F) Primary Care Provider: [name] Number Other Clinician: Referring Provider: Treating Provider/Extender: [name] in Treatment: 4 Information Obtained from: Patient Chief Complaint Patient presents for treatment of open ulcers due to venous insufficiency Electronic Signature(s) Signed: 06/07/2022 11:57:31 AM By: [name] MD FACS Entered By: [name] on 06/07/2022 11:57:31 -------------------------------------------------------------------------------- Debridement Details Patient Name: Date of Service: [name], [name] 06/07/2022 11:00 A M Medical Record Number: [id number] Patient Account Number: [id number] Date of Birth/Sex: Treating RN: Jun 02, 1940 (82 y.o. [name] Primary Care Provider: [name] Number Other Clinician: Referring Provider: Treating Provider/Extender: [name] in Treatment: 4 Debridement Performed for Assessment: Wound #5 Right,Anterior Lower Leg Performed By: Physician [name], MD Debridement Type: Debridement Severity of Tissue Pre Debridement: Fat layer exposed Level of Consciousness (Pre-procedure): Awake and Alert Pre-procedure Verification/Time Out Yes - 11:55 Taken: Start Time: 11:55 Pain Control: Lidocaine 5% topical ointment T Area Debrided (L x W): otal 1 (cm) x 1 (cm) = 1 (cm) Tissue and other material debrided: Non-Viable, Eschar Level: Non-Viable Tissue Debridement Description: Selective/Open Wound Instrument: Curette Bleeding: Minimum Hemostasis Achieved: Pressure End Time: 11:56 Procedural Pain: 0 Post Procedural Pain:  0 Response to Treatment: Procedure was tolerated well Level of Consciousness (Post- Awake and Alert procedure): Post Debridement Measurements of Total Wound Length: (cm) 1 Width: (cm) 1 Depth: (cm) 0.1 Volume: (cm) 0.079 Character of Wound/Ulcer Post Debridement: Improved Severity of Tissue Post Debridement: Fat layer exposed [name] ([id number]) [id number].pdf Page 2 of 9 Post Procedure Diagnosis Same as Pre-procedure Notes Scribed for Dr. [name] by [name] Electronic Signature(s) Signed: 06/07/2022 12:23:27 PM By: [name] RN Signed: 06/07/2022 3:18:38 PM By: [name] MD FACS Entered By: [name] on 06/07/2022 11:56:42 -------------------------------------------------------------------------------- HPI Details Patient Name: Date of Service: [name], [name] 06/07/2022 11:00 A M Medical Record Number: [id number] Patient Account Number: [id number] Date of Birth/Sex: Treating RN: [birth year] (82 y.o. F) Primary Care Provider: [name] Number Other Clinician: Referring Provider: Treating Provider/Extender: [name] in Treatment: 4 History of Present Illness HPI Description: ADMISSION 12/25/2021 This is an 82 year old woman with multiple medical comorbidities including congestive heart failure, hypertension, end-stage renal disease on hemodialysis, polyneuropathy, poorly controlled type 2 diabetes mellitus (last hemoglobin A1c available to me is from December 2022 and was 9.6). She presents to clinic today with multiple open ulcers on her bilateral lower extremities in the setting of severe edema. She says that she has compression stockings but has not worn them in some time because they are too tight. Her wounds are secondary to trauma from her wheelchair. She often sleeps in her wheelchair with her legs hanging down. ABIs in clinic today were noncompressible on the right and 0.57 on the left. She has  multiple shallow ulcers on the bilateral lower extremities. She has 2+ pitting edema and her legs are extremely tense. They are somewhat erythematous and warm. She is currently taking cephalexin prescribed by her primary care provider due to concern for cellulitis. 01/01/2022: All of her wounds are healed. Edema control with the Kerlix and Coban wraps is excellent. She does have compression stockings at home. READMISSION 05/07/2022 The patient returns with a new wound on her right lower anterior leg.  She is accompanied by her daughter. They admit that she has not been wearing her compression stockings at home. The patient's daughter reports that this wound started as a blister that ruptured. She has been applying peroxide. Both indicate that the patient is still frequently sleeping in her wheelchair with her legs dependent. ABI in clinic today was 1.08. The wound is small, just medial to the anterior tibial surface, clean. 06/07/2022: The patient has failed to show up for multiple clinic visits. The patient's daughter says that she simply refuses to come when its time for her appointments. She continues to be quite stubborn about sleeping in her wheelchair with her legs dependent. The wound on her right anterior tibial surface is shallow and just has a bit of eschar buildup. Electronic Signature(s) Signed: 06/07/2022 11:58:41 AM By: [name] MD FACS Entered By: [name] on 06/07/2022 11:58:41 -------------------------------------------------------------------------------- Physical Exam Details Patient Name: Date of Service: [name], [name] 06/07/2022 11:00 A M Medical Record Number: [id number] Patient Account Number: [id number] Date of Birth/Sex: Treating RN: Feb 01, 1941 (82 y.o. F) Primary Care Provider: [name] Number Other Clinician: Referring Provider: Treating Provider/Extender: [name] in Treatment: [place], [name] ([id number])  [id number] Page 3 of 9 Constitutional . . . . no acute distress. Respiratory Normal work of breathing on room air. Notes 06/07/2022: The wound on her right anterior tibial surface is shallow and just has a bit of eschar buildup. Electronic Signature(s) Signed: 06/07/2022 11:59:23 AM By: [name] MD FACS Entered By: [name] on 06/07/2022 11:59:23 -------------------------------------------------------------------------------- Physician Orders Details Patient Name: Date of Service: [name], [name] 06/07/2022 11:00 A M Medical Record Number: [id number] Patient Account Number: [id number] Date of Birth/Sex: Treating RN: 1941/03/19 (82 y.o. [name] Primary Care Provider: [name] Number Other Clinician: Referring Provider: Treating Provider/Extender: [name] in Treatment: 4 Verbal / Phone Orders: No Diagnosis Coding ICD-10 Coding Code Description [phone] Non-pressure chronic ulcer of other part of right lower leg with fat layer exposed I50.9 Heart failure, unspecified N18.6 End stage renal disease E44.0 Moderate protein-calorie malnutrition E11.42 Type 2 diabetes mellitus with diabetic polyneuropathy I87.2 Venous insufficiency (chronic) (peripheral) Follow-up Appointments ppointment in 1 week. - Dr. [name] Room 3++++ Herbie Saxon Return A Anesthetic (In clinic) Topical Lidocaine 4% applied to wound bed - Used in clinic Bathing/ Shower/ Hygiene Other Bathing/Shower/Hygiene Orders/Instructions: - Please DO NOT GET COMPRESSION WRAPS on right leg wet. Edema Control - Lymphedema / SCD / Other Other Edema Control Orders/Instructions: - Please elevate both legs throughout the day Additional Orders / Instructions Other: - Please wear compression stocking on left leg Wound Treatment Wound #5 - Lower Leg Wound Laterality: Right, Anterior Cleanser: Soap and Water 1 x Per Week/30 Days Discharge  Instructions: May shower and wash wound with dial antibacterial soap and water prior to dressing change. Cleanser: Wound Cleanser 1 x Per Week/30 Days Discharge Instructions: Cleanse the wound with wound cleanser prior to applying a clean dressing using gauze sponges, not tissue or cotton balls. Prim Dressing: Sorbalgon AG Dressing 2x2 (in/in) 1 x Per Week/30 Days ary Discharge Instructions: Apply to wound bed as instructed Secured With: Transpore Surgical Tape, 2x10 (in/yd) 1 x Per Week/30 Days Discharge Instructions: Secure dressing with tape as directed. Compression Wrap: ThreePress (3 layer compression wrap) 1 x Per Week/30 Days Discharge Instructions: Apply three layer compression as directed. [name], [name] ([id number]) [id number] Page 4 of 9 Compression Wrap: Netting, Tubular #5 1 x Per Week/30 Days Electronic  Signature(s) Signed: 06/07/2022 3:18:38 PM By: [name] MD FACS Entered By: [name] on 06/07/2022 12:00:38 -------------------------------------------------------------------------------- Problem List Details Patient Name: Date of Service: [name], [name] 06/07/2022 11:00 A M Medical Record Number: [id number] Patient Account Number: [id number] Date of Birth/Sex: Treating RN: 04-Oct-1940 (82 y.o. F) Primary Care Provider: [name] Number Other Clinician: Referring Provider: Treating Provider/Extender: [name] in Treatment: 4 Active Problems ICD-10 Encounter Code Description Active Date MDM Diagnosis [phone] Non-pressure chronic ulcer of other part of right lower leg with fat layer 05/07/2022 No Yes exposed I50.9 Heart failure, unspecified 05/07/2022 No Yes N18.6 End stage renal disease 05/07/2022 No Yes E44.0 Moderate protein-calorie malnutrition 05/07/2022 No Yes E11.42 Type 2 diabetes mellitus with diabetic polyneuropathy 05/07/2022 No Yes I87.2 Venous insufficiency (chronic) (peripheral) 05/07/2022 No  Yes Inactive Problems Resolved Problems Electronic Signature(s) Signed: 06/07/2022 11:55:26 AM By: [name] MD FACS Entered By: [name] on 06/07/2022 11:55:26 Progress Note Details -------------------------------------------------------------------------------- [name] ([id number]) [id number] Page 5 of 9 Patient Name: Date of Service: [name], [name] 06/07/2022 11:00 A M Medical Record Number: [id number] Patient Account Number: [id number] Date of Birth/Sex: Treating RN: 05-Dec-1940 (82 y.o. F) Primary Care Provider: [name] Number Other Clinician: Referring Provider: Treating Provider/Extender: [name] in Treatment: 4 Subjective Chief Complaint Information obtained from Patient Patient presents for treatment of open ulcers due to venous insufficiency History of Present Illness (HPI) ADMISSION 12/25/2021 This is an 82 year old woman with multiple medical comorbidities including congestive heart failure, hypertension, end-stage renal disease on hemodialysis, polyneuropathy, poorly controlled type 2 diabetes mellitus (last hemoglobin A1c available to me is from December 2022 and was 9.6). She presents to clinic today with multiple open ulcers on her bilateral lower extremities in the setting of severe edema. She says that she has compression stockings but has not worn them in some time because they are too tight. Her wounds are secondary to trauma from her wheelchair. She often sleeps in her wheelchair with her legs hanging down. ABIs in clinic today were noncompressible on the right and 0.57 on the left. She has multiple shallow ulcers on the bilateral lower extremities. She has 2+ pitting edema and her legs are extremely tense. They are somewhat erythematous and warm. She is currently taking cephalexin prescribed by her primary care provider due to concern for cellulitis. 01/01/2022: All of her wounds are  healed. Edema control with the Kerlix and Coban wraps is excellent. She does have compression stockings at home. READMISSION 05/07/2022 The patient returns with a new wound on her right lower anterior leg. She is accompanied by her daughter. They admit that she has not been wearing her compression stockings at home. The patient's daughter reports that this wound started as a blister that ruptured. She has been applying peroxide. Both indicate that the patient is still frequently sleeping in her wheelchair with her legs dependent. ABI in clinic today was 1.08. The wound is small, just medial to the anterior tibial surface, clean. 06/07/2022: The patient has failed to show up for multiple clinic visits. The patient's daughter says that she simply refuses to come when its time for her appointments. She continues to be quite stubborn about sleeping in her wheelchair with her legs dependent. The wound on her right anterior tibial surface is shallow and just has a bit of eschar buildup. Patient History Information obtained from Patient, Caregiver, Chart. Family History Heart Disease - Mother,Father, Hypertension - Mother,Father, No family history of Cancer, Diabetes, Hereditary  Spherocytosis, Kidney Disease, Lung Disease, Seizures, Stroke, Thyroid Problems, Tuberculosis. Social History Never smoker, Marital Status - Divorced, Alcohol Use - Never, Drug Use - No History, Caffeine Use - Daily. Medical History Eyes Patient has history of Cataracts - bil removed Denies history of Glaucoma, Optic Neuritis Hematologic/Lymphatic Denies history of Anemia Cardiovascular Patient has history of Arrhythmia - afib, Congestive Heart Failure, Hypertension Endocrine Patient has history of Type II Diabetes Denies history of Type I Diabetes Genitourinary Patient has history of End Stage Renal Disease - HemoDialysis Integumentary (Skin) Denies history of History of Burn Neurologic Patient has history of  Neuropathy Oncologic Denies history of Received Chemotherapy, Received Radiation Psychiatric Denies history of Anorexia/bulimia, Confinement Anxiety Hospitalization/Surgery History - AV fistula plaement left arm. - tonsillectomy. Medical A Surgical History Notes nd Constitutional Symptoms (General Health) vitamin D deficiency Cardiovascular hyperlipidemia [name], [name] ([id number]) [id number] Page 6 of 9 Objective Constitutional no acute distress. Vitals Time Taken: 11:30 AM, Height: 63 in, Weight: 123 lbs, BMI: 21.8, Temperature: 97.3 F, Pulse: 70 bpm, Respiratory Rate: 16 breaths/min, Blood Pressure: 113/71 mmHg. Respiratory Normal work of breathing on room air. General Notes: 06/07/2022: The wound on her right anterior tibial surface is shallow and just has a bit of eschar buildup. Integumentary (Hair, Skin) Wound #5 status is Open. Original cause of wound was Blister. The date acquired was: 04/30/2022. The wound has been in treatment 4 weeks. The wound is located on the Right,Anterior Lower Leg. The wound measures 1cm length x 1cm width x 0.1cm depth; 0.785cm^2 area and 0.079cm^3 volume. There is Fat Layer (Subcutaneous Tissue) exposed. There is a medium amount of serous drainage noted. There is large (67-100%) pink granulation within the wound bed. There is a small (1-33%) amount of necrotic tissue within the wound bed including Adherent Slough. The periwound skin appearance exhibited: Scarring, Dry/Scaly, Hemosiderin Staining. Periwound temperature was noted as No Abnormality. 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 There was a Selective/Open Wound Non-Viable Tissue Debridement with a total area of 1 sq cm performed by [name], MD. With the following instrument(s): Curette to remove Non-Viable tissue/material. Material removed includes Eschar after achieving pain control using Lidocaine 5% topical ointment. No specimens were taken. A time out was conducted at 11:55, prior to the start of the procedure. A Minimum amount of bleeding was controlled with Pressure. The procedure was tolerated well with a pain level of 0 throughout and a pain level of 0 following the procedure. Post Debridement Measurements: 1cm length x 1cm width x 0.1cm depth; 0.079cm^3 volume. Character of Wound/Ulcer Post Debridement is improved. Severity of Tissue Post Debridement is: Fat layer exposed. Post procedure Diagnosis Wound #5: Same as Pre-Procedure General Notes: Scribed for Dr. [name] by [name]. Pre-procedure diagnosis of Wound #5 is a Venous Leg Ulcer located on the Right,Anterior Lower Leg . There was a Three Layer Compression Therapy Procedure by [name], RN. Post procedure Diagnosis Wound #5: Same as Pre-Procedure Plan Follow-up Appointments: Return Appointment in 1 week. - Dr. [name] Room 3++++ EXTRA TIME+++++ Anesthetic: (In clinic) Topical Lidocaine 4% applied to wound bed - Used in clinic Bathing/ Shower/ Hygiene: Other Bathing/Shower/Hygiene Orders/Instructions: -  Please DO NOT GET COMPRESSION WRAPS on right leg wet. Edema Control - Lymphedema / SCD / Other: Other Edema Control Orders/Instructions: - Please elevate both legs throughout the day Additional Orders / Instructions: Other: - Please wear compression stocking on left leg WOUND #5: - Lower Leg Wound Laterality: Right, Anterior Cleanser: Soap and Water 1 x Per Week/30 Days Discharge Instructions: May shower and wash wound with dial  antibacterial soap and water prior to dressing change. Cleanser: Wound Cleanser 1 x Per Week/30 Days Discharge Instructions: Cleanse the wound with wound cleanser prior to applying a clean dressing using gauze sponges, not tissue or cotton balls. Prim Dressing: Sorbalgon AG Dressing 2x2 (in/in) 1 x Per Week/30 Days ary Discharge Instructions: Apply to wound bed as instructed Secured With: Transpore Surgical T ape, 2x10 (in/yd) 1 x Per Week/30 Days Discharge Instructions: Secure dressing with tape as directed. Com pression Wrap: ThreePress (3 layer compression wrap) 1 x Per Week/30 Days Discharge Instructions: Apply three layer compression as directed. [name], [name] ([id number]) [id number] Page 7 of 9 Compression Wrap: Netting, Tubular #5 1 x Per Week/30 Days 06/07/2022: The wound on her right anterior tibial surface is shallow and just has a bit of eschar buildup. I used a curette to debride the eschar from her wound. We will continue silver alginate and 3 layer compression. According to the patient's daughter, the patient is quite difficult in terms of maintaining compliance with recommendations, despite family efforts. Hopefully we will be able to get the wound healed despite this. Electronic Signature(s) Signed: 06/07/2022 12:01:52 PM By: [name] MD FACS Entered By: [name] on 06/07/2022 12:01:51 -------------------------------------------------------------------------------- HxROS Details Patient Name: Date of Service: [name], [name] 06/07/2022 11:00 A M Medical Record Number: [id number] Patient Account Number: [id number] Date of Birth/Sex: Treating RN: 12/20/1940 (82 y.o. F) Primary Care Provider: [name] Number Other Clinician: Referring Provider: Treating Provider/Extender: [name] in Treatment: 4 Information Obtained From Patient Caregiver Chart Constitutional Symptoms (General Health) Medical  History: Past Medical History Notes: vitamin D deficiency Eyes Medical History: Positive for: Cataracts - bil removed Negative for: Glaucoma; Optic Neuritis Hematologic/Lymphatic Medical History: Negative for: Anemia Cardiovascular Medical History: Positive for: Arrhythmia - afib; Congestive Heart Failure; Hypertension Past Medical History Notes: hyperlipidemia Endocrine Medical History: Positive for: Type II Diabetes Negative for: Type I Diabetes Time with diabetes: approx 6 yrs Treated with: Insulin Blood sugar tested every day: Yes Tested : 2 times per day Genitourinary Medical History: Positive for: End Stage Renal Disease - HemoDialysis Integumentary (Skin) Medical HistoryJAYLI, FOGLEMAN ([id number]) [id number] Page 8 of 9 Negative for: History of Burn Neurologic Medical History: Positive for: Neuropathy Oncologic Medical History: Negative for: Received Chemotherapy; Received Radiation Psychiatric Medical History: Negative for: Anorexia/bulimia; Confinement Anxiety HBO Extended History Items Eyes: Cataracts Immunizations Pneumococcal Vaccine: Received Pneumococcal Vaccination: No Implantable Devices Yes Hospitalization / Surgery History Type of Hospitalization/Surgery AV fistula plaement left arm tonsillectomy Family and Social History Cancer: No; Diabetes: No; Heart Disease: Yes - Mother,Father; Hereditary Spherocytosis: No; Hypertension: Yes - Mother,Father; Kidney Disease: No; Lung Disease: No; Seizures: No; Stroke: No; Thyroid Problems: No; Tuberculosis: No; Never smoker; Marital Status - Divorced; Alcohol Use: Never; Drug Use: No History; Caffeine Use: Daily; Financial Concerns: No; Food, Clothing or Shelter Needs: No; Support System Lacking: No; Transportation Concerns: No Engineer, maintenance) Signed: 06/07/2022 3:18:38 PM By: [name] MD FACS Entered By: [name] on 06/07/2022  11:58:48 -------------------------------------------------------------------------------- SuperBill Details Patient Name: Date of Service: Osvaldo Angst Marquez  06/07/2022 Medical Record Number: [id number] Patient Account Number: [id number] Date of Birth/Sex: Treating RN: [birth year] (82 y.o. F) Primary Care Provider: [name] Number Other Clinician: Referring Provider: Treating Provider/Extender: [name] in Treatment: 4 Diagnosis Coding ICD-10 Codes Code Description [phone] Non-pressure chronic ulcer of other part of right lower leg with fat layer exposed I50.9 Heart failure, unspecified N18.6 End stage renal disease E44.0 Moderate protein-calorie malnutrition E11.42 Type 2 diabetes mellitus with diabetic polyneuropathy I87.2 Venous insufficiency (chronic) (peripheral) Facility Procedures QUYNN, VILCHIS ([id number]): CPT4 Code Description [id number] - DEBRIDE WOUND 1ST 20 SQ CM OR < ICD-10 Diagnosis Description T35.331 Non-pressure chronic ulcer of other part of right lower leg with fa [id number] Page 9 of 9: Modifier Quantity 1 t layer exposed Physician Procedures : CPT4 Code Description Modifier [id number] - WC PHYS LEVEL 3 - EST PT 25 ICD-10 Diagnosis Description P15.806 Non-pressure chronic ulcer of other part of right lower leg with fat layer exposed N18.6 End stage renal disease E44.0 Moderate  protein-calorie malnutrition I87.2 Venous insufficiency (chronic) (peripheral) Quantity: 1 : [id number] - WC PHYS DEBR WO ANESTH 20 SQ CM ICD-10 Diagnosis Description S30.141 Non-pressure chronic ulcer of other part of right lower leg with fat layer exposed Quantity: 1 Electronic Signature(s) Signed: 06/07/2022 12:02:32 PM By: [name] MD FACS Entered By: [name] on 06/07/2022 12:02:32

## 2022-06-14 ENCOUNTER — Encounter (HOSPITAL_BASED_OUTPATIENT_CLINIC_OR_DEPARTMENT_OTHER): Payer: Medicare HMO | Admitting: General Surgery

## 2022-06-14 DIAGNOSIS — I872 Venous insufficiency (chronic) (peripheral): Secondary | ICD-10-CM | POA: Diagnosis not present

## 2022-06-16 ENCOUNTER — Encounter (HOSPITAL_COMMUNITY): Admission: RE | Payer: Self-pay | Source: Home / Self Care

## 2022-06-16 ENCOUNTER — Ambulatory Visit (HOSPITAL_COMMUNITY): Admission: RE | Admit: 2022-06-16 | Payer: Medicare HMO | Source: Home / Self Care | Admitting: Vascular Surgery

## 2022-06-16 SURGERY — A/V FISTULAGRAM
Anesthesia: LOCAL | Laterality: Right

## 2022-06-18 NOTE — Progress Notes (Signed)
Terry, Marquez ([id number]) [id number] Page 1 of 9 Visit Report for 06/14/2022 Arrival Information Details Patient Name: Date of Service: Terry, [name] 06/14/2022 3:45 PM Medical Record Number: [id number] Patient Account Number: [id number] Date of Birth/Sex: Treating RN: Nov 09, 1940 (82 y.o. [name] Primary Care Cecilio Ohlrich: Terry Number Other Clinician: Referring Pecolia Marando: Treating Harlea Goetzinger/Extender: [name] in Treatment: 5 Visit Information History Since Last Visit Added or deleted any medications: No Patient Arrived: Wheel Chair Any new allergies or adverse reactions: No Arrival Time: 16:06 Had a fall or experienced change in No Accompanied By: daughter activities of daily living that may affect Transfer Assistance: Manual risk of falls: Signs or symptoms of abuse/neglect since last visito No Hospitalized since last visit: No Implantable device outside of the clinic excluding No cellular tissue based products placed in the center since last visit: Has Dressing in Place as Prescribed: Yes Has Compression in Place as Prescribed: Yes Pain Present Now: Yes Electronic Signature(s) Signed: 06/16/2022 5:19:42 PM By: [name] RN Entered By: [name] on 06/14/2022 16:07:01 -------------------------------------------------------------------------------- Compression Therapy Details Patient Name: Date of Service: [name], [name] 06/14/2022 3:45 PM Medical Record Number: [id number] Patient Account Number: [id number] Date of Birth/Sex: Treating RN: 09/22/1940 (82 y.o. [name] Primary Care Draco Malczewski: Terry Number Other Clinician: Referring Aleksa Catterton: Treating Donoven Pett/Extender: [name] in Treatment: 5 Compression Therapy Performed for Wound Assessment: Wound #5 Right,Anterior Lower Leg Performed By: Clinician [name], RN Compression Type: Three  Layer Post Procedure Diagnosis Same as Pre-procedure Electronic Signature(s) Signed: 06/16/2022 5:19:42 PM By: [name] RN Entered By: [name] on 06/14/2022 16:11:43 Frederico Hamman, [name] ([id number]) [id number] Page 2 of 9 -------------------------------------------------------------------------------- Compression Therapy Details Patient Name: Date of Service: [name], [name] 06/14/2022 3:45 PM Medical Record Number: [id number] Patient Account Number: [id number] Date of Birth/Sex: Treating RN: 09/20/1940 (82 y.o. [name] Primary Care Jojo Geving: Terry Number Other Clinician: Referring Channell Quattrone: Treating Alayzia Pavlock/Extender: [name] in Treatment: 5 Compression Therapy Performed for Wound Assessment: Wound #6 Left,Anterior Lower Leg Performed By: Clinician [name], RN Compression Type: Three Layer Post Procedure Diagnosis Same as Pre-procedure Electronic Signature(s) Signed: 06/16/2022 5:19:42 PM By: [name] RN Entered By: [name] on 06/14/2022 16:11:43 -------------------------------------------------------------------------------- Encounter Discharge Information Details Patient Name: Date of Service: [name], [name] 06/14/2022 3:45 PM Medical Record Number: [id number] Patient Account Number: [id number] Date of Birth/Sex: Treating RN: October 18, 1940 (82 y.o. [name] Primary Care Emmaleah Meroney: Terry Number Other Clinician: Referring Vernon Maish: Treating Densel Kronick/Extender: [name] in Treatment: 5 Encounter Discharge Information Items Post Procedure Vitals Discharge Condition: Stable Temperature (F): 97.9 Ambulatory Status: Wheelchair Pulse (bpm): 72 Discharge Destination: Home Respiratory Rate (breaths/min): 16 Transportation: Private Auto Blood Pressure (mmHg): 122/74 Accompanied By: Daughter Schedule Follow-up Appointment: Yes Clinical  Summary of Care: Patient Declined Electronic Signature(s) Signed: 06/16/2022 5:19:42 PM By: [name] RN Entered By: [name] on 06/14/2022 16:42:57 -------------------------------------------------------------------------------- Lower Extremity Assessment Details Patient Name: Date of Service: Terry, [name] 06/14/2022 3:45 PM Medical Record Number: [id number] Patient Account Number: [id number] Date of Birth/Sex: Treating RN: [birth year] (82 y.o. [name] Primary Care Janya Eveland: Terry Number Other Clinician: Referring Sharia Averitt: Treating Graylen Noboa/Extender: [name] in Treatment: 5 Edema Assessment Assessed: Shirlyn Goltz: No] Patrice Paradise: No] [Left: Edema] [Right: Clovis Pu, [name] ([id number]) [id number] Page 3 of 9 Left: Right: Point of Measurement: 33 cm From Medial Instep 32 cm 31.9 cm Ankle Left: Right: Point  of Measurement: 9 cm From Medial Instep 20 cm 19.9 cm Vascular Assessment Pulses: Dorsalis Pedis Palpable: [Left:Yes] [Right:Yes] Electronic Signature(s) Signed: 06/16/2022 5:19:42 PM By: [name] RN Entered By: [name] on 06/14/2022 16:08:38 -------------------------------------------------------------------------------- Multi Wound Chart Details Patient Name: Date of Service: Marquez, Terry NN 06/14/2022 3:45 PM Medical Record Number: [id number] Patient Account Number: [id number] Date of Birth/Sex: Treating RN: 1940-10-07 (82 y.o. F) Primary Care Kyshawn Teal: Terry Number Other Clinician: Referring Dewan Emond: Treating Adalind Weitz/Extender: [name] in Treatment: 5 Vital Signs Height(in): 63 Pulse(bpm): 72 Weight(lbs): [id number] Blood Pressure(mmHg): 122/74 Body Mass Index(BMI): 21.8 Temperature(F): 97.9 Respiratory Rate(breaths/min): 16 [5:Photos:] [N/A:N/A] Right, Anterior Lower Leg Left, Anterior Lower Leg N/A Wound Location: Blister Other  Lesion N/A Wounding Event: Venous Leg Ulcer Abrasion N/A Primary Etiology: Cataracts, Arrhythmia, Congestive Cataracts, Arrhythmia, Congestive N/A Comorbid History: Heart Failure, Hypertension, Type II Heart Failure, Hypertension, Type II Diabetes, End Stage Renal Disease, Diabetes, End Stage Renal Disease, Neuropathy Neuropathy 04/30/2022 06/12/2022 N/A Date Acquired: 5 0 N/A Weeks of Treatment: Open Open N/A Wound Status: No No N/A Wound Recurrence: 0.9x0.9x0.1 0.2x0.2x0.1 N/A Measurements L x W x D (cm) 0.636 0.031 N/A A (cm) : rea 0.064 0.003 N/A Volume (cm) : 75.90% N/A N/A % Reduction in Area: 75.80% N/A N/A % Reduction in Volume: Full Thickness Without Exposed Full Thickness Without Exposed N/A Classification: Support Structures Support Structures Medium Medium N/A Exudate Amount: Serous N/A N/A Exudate Type: amber N/A N/A Exudate Color: Large (67-100%) Small (1-33%) N/A Granulation Amount: Pink Red N/A Granulation QualityEPPIE, [name] ([id number]) [phone].pdf Page 4 of 9 Small (1-33%) Large (67-100%) N/A Necrotic Amount: Adherent Slough Eschar, Adherent Slough N/A Necrotic Tissue: Fat Layer (Subcutaneous Tissue): Yes Fat Layer (Subcutaneous Tissue): Yes N/A Exposed Structures: Fascia: No Tendon: No Muscle: No Joint: No Bone: No None N/A N/A Epithelialization: Debridement - Selective/Open Wound Debridement - Selective/Open Wound N/A Debridement: Pre-procedure Verification/Time Out 15:56 15:56 N/A Taken: Lidocaine 5% topical ointment Lidocaine 5% topical ointment N/A Pain Control: Necrotic/Eschar, Psychologist, prison and probation services, Slough N/A Tissue Debrided: Non-Viable Tissue Non-Viable Tissue N/A Level: 0.81 0.04 N/A Debridement A (sq cm): rea Curette Curette N/A Instrument: Minimum Minimum N/A Bleeding: Pressure Pressure N/A Hemostasis A chieved: 0 0 N/A Procedural Pain: 0 0 N/A Post Procedural Pain: Procedure  was tolerated well Procedure was tolerated well N/A Debridement Treatment Response: 0.9x0.9x0.1 0.2x0.2x0.1 N/A Post Debridement Measurements L x W x D (cm) 0.064 0.003 N/A Post Debridement Volume: (cm) Scarring: Yes Scarring: Yes N/A Periwound Skin Texture: Dry/Scaly: Yes No Abnormalities Noted N/A Periwound Skin Moisture: Hemosiderin Staining: Yes No Abnormalities Noted N/A Periwound Skin Color: No Abnormality No Abnormality N/A Temperature: Compression Therapy Compression Therapy N/A Procedures Performed: Debridement Debridement Treatment Notes Electronic Signature(s) Signed: 06/14/2022 4:28:44 PM By: [name] MD FACS Entered By: [name] on 06/14/2022 16:28:44 -------------------------------------------------------------------------------- Multi-Disciplinary Care Plan Details Patient Name: Date of Service: [name], [name] 06/14/2022 3:45 PM Medical Record Number: [id number] Patient Account Number: [id number] Date of Birth/Sex: Treating RN: Sep 23, 1940 (81 y.o. [name] Primary Care Bridger Pizzi: Terry Number Other Clinician: Referring Lequita Meadowcroft: Treating Solly Derasmo/Extender: [name] in Treatment: 5 Active Inactive Wound/Skin Impairment Nursing Diagnoses: Knowledge deficit related to ulceration/compromised skin integrity Goals: Patient/caregiver will verbalize understanding of skin care regimen Date Initiated: 05/07/2022 Target Resolution Date: 08/01/2022 Goal Status: Active Interventions: Assess ulceration(s) every visit Treatment Activities: Skin care regimen initiated : 05/07/2022 Notes: Electronic Signature(s) LOVETA, [name] ([id number]) [id number] Page 5 of 9 Signed: 06/16/2022 5:19:42 PM By:  [name] RN Entered By: [name] on 06/14/2022 16:40:47 -------------------------------------------------------------------------------- Pain Assessment Details Patient Name: Date  of Service: [name], CHRYSTAL NN 06/14/2022 3:45 PM Medical Record Number: [id number] Patient Account Number: [id number] Date of Birth/Sex: Treating RN: [birth year] (82 y.o. [name] Primary Care Fread Kottke: Terry Number Other Clinician: Referring Keigan Tafoya: Treating Adalyna Godbee/Extender: [name] in Treatment: 5 Active Problems Location of Pain Severity and Description of Pain Patient Has Paino Yes Site Locations Pain Location: Generalized Pain With Dressing Change: No Duration of the Pain. Constant / Intermittento Constant Rate the pain. Current Pain Level: 4 Worst Pain Level: 10 Least Pain Level: 3 Tolerable Pain Level: 4 Character of Pain Describe the Pain: Difficult to Pinpoint Pain Management and Medication Current Pain Management: Medication: Yes Cold Application: No Rest: Yes Massage: No Activity: No T.E.N.S.: No Heat Application: No Leg drop or elevation: No Is the Current Pain Management Adequate: Adequate How does your wound impact your activities of daily livingo Sleep: No Bathing: No Appetite: No Relationship With Others: No Bladder Continence: No Emotions: No Bowel Continence: No Work: No Toileting: No Drive: No Dressing: No Hobbies: No Electronic Signature(s) Signed: 06/16/2022 5:19:42 PM By: [name] RN Entered By: [name] on 06/14/2022 16:08:17 Eda [name] ([id number]) [id number] Page 6 of 9 -------------------------------------------------------------------------------- Patient/Caregiver Education Details Patient Name: Date of Service: TYLENA, [name] 2/12/2024andnbsp3:45 PM Medical Record Number: [id number] Patient Account Number: [id number] Date of Birth/Gender: Treating RN: Jul 12, 1940 (82 y.o. [name] Primary Care Physician: Terry Number Other Clinician: Referring Physician: Treating Physician/Extender: [name] in  Treatment: 5 Education Assessment Education Provided To: Patient Education Topics Provided Electronic Signature(s) Signed: 06/16/2022 5:19:42 PM By: [name] RN Entered By: [name] on 06/14/2022 16:40:59 -------------------------------------------------------------------------------- Wound Assessment Details Patient Name: Date of Service: [name], [name] 06/14/2022 3:45 PM Medical Record Number: [id number] Patient Account Number: [id number] Date of Birth/Sex: Treating RN: 1940/09/10 (82 y.o. [name] Primary Care Darrik Richman: Terry Number Other Clinician: Referring Morgen Linebaugh: Treating Bradly Sangiovanni/Extender: [name] in Treatment: 5 Wound Status Wound Number: 5 Primary Venous Leg Ulcer Etiology: Wound Location: Right, Anterior Lower Leg Wound Open Wounding Event: Blister Status: Date Acquired: 04/30/2022 Comorbid Cataracts, Arrhythmia, Congestive Heart Failure, Hypertension, Weeks Of Treatment: 5 History: Type II Diabetes, End Stage Renal Disease, Neuropathy Clustered Wound: No Photos Wound Measurements Length: (cm) 0.9 Width: (cm) 0.9 Depth: (cm) 0.1 Area: (cm) 0.636 Volume: (cm) 0.064 % Reduction in Area: 75.9% % Reduction in Volume: 75.8% Epithelialization: None Tunneling: No Undermining: No Wound Description Classification: Full Thickness Without Exposed Support Structures Exudate Amount: Medium Exudate Type: Serous Exudate Color: amber Reitman, [name] ([id number]) Wound Bed Granulation Amount: Large (67-100%) Granulation Quality: Pink Necrotic Amount: Small (1-33%) Necrotic Quality: Adherent Slough Foul Odor After Cleansing: No Slough/Fibrino Yes [phone].pdf Page 7 of 9 Exposed Structure Fascia Exposed: No Fat Layer (Subcutaneous Tissue) Exposed: Yes Tendon Exposed: No Muscle Exposed: No Joint Exposed: No Bone Exposed: No Periwound Skin Texture Texture Color No  Abnormalities Noted: No No Abnormalities Noted: No Scarring: Yes Hemosiderin Staining: Yes Moisture Temperature / Pain No Abnormalities Noted: No Temperature: No Abnormality Dry / Scaly: Yes Treatment Notes Wound #5 (Lower Leg) Wound Laterality: Right, Anterior Cleanser Soap and Water Discharge Instruction: May shower and wash wound with dial antibacterial soap and water prior to dressing change. Wound Cleanser Discharge Instruction: Cleanse the wound with wound cleanser prior to applying a clean dressing using gauze sponges, not tissue or cotton  balls. Peri-Wound Care Topical Primary Dressing Sorbalgon AG Dressing 2x2 (in/in) Discharge Instruction: Apply to wound bed as instructed Secondary Dressing Secured With Transpore Surgical Tape, 2x10 (in/yd) Discharge Instruction: Secure dressing with tape as directed. Compression Wrap ThreePress (3 layer compression wrap) Discharge Instruction: Apply three layer compression as directed. Netting, Tubular #5 Compression Stockings Add-Ons Electronic Signature(s) Signed: 06/16/2022 5:19:42 PM By: [name] RN Entered By: [name] on 06/14/2022 15:57:19 -------------------------------------------------------------------------------- Wound Assessment Details Patient Name: Date of Service: [name], [name] 06/14/2022 3:45 PM Medical Record Number: [id number] Patient Account Number: [id number] Date of Birth/Sex: Treating RN: 1940/07/16 (82 y.o. [name] Primary Care Taite Schoeppner: Terry Number Other Clinician: Referring Kendyll Huettner: Treating Christinia Lambeth/Extender: [name] in Treatment: 5 Wound Status Wound Number: 6 Primary Abrasion Etiology: Wound Location: Left, Anterior Lower Leg Wound Open Wounding Event: Other Lesion Eda [name] ([id number]) [id number] Page 8 of 9 Wounding Event: Other Lesion Status: Date Acquired: 06/12/2022 Comorbid Cataracts,  Arrhythmia, Congestive Heart Failure, Hypertension, Weeks Of Treatment: 0 History: Type II Diabetes, End Stage Renal Disease, Neuropathy Clustered Wound: No Photos Wound Measurements Length: (cm) 0.2 Width: (cm) 0.2 Depth: (cm) 0.1 Area: (cm) 0.031 Volume: (cm) 0.003 % Reduction in Area: % Reduction in Volume: Tunneling: No Undermining: No Wound Description Classification: Full Thickness Without Exposed Support Structures Exudate Amount: Medium Foul Odor After Cleansing: No Slough/Fibrino Yes Wound Bed Granulation Amount: Small (1-33%) Exposed Structure Granulation Quality: Red Fat Layer (Subcutaneous Tissue) Exposed: Yes Necrotic Amount: Large (67-100%) Necrotic Quality: Eschar, Adherent Slough Periwound Skin Texture Texture Color No Abnormalities Noted: No No Abnormalities Noted: Yes Scarring: Yes Temperature / Pain Temperature: No Abnormality Moisture No Abnormalities Noted: Yes Treatment Notes Wound #6 (Lower Leg) Wound Laterality: Left, Anterior Cleanser Soap and Water Discharge Instruction: May shower and wash wound with dial antibacterial soap and water prior to dressing change. Wound Cleanser Discharge Instruction: Cleanse the wound with wound cleanser prior to applying a clean dressing using gauze sponges, not tissue or cotton balls. Peri-Wound Care Topical Primary Dressing Sorbalgon AG Dressing 2x2 (in/in) Discharge Instruction: Apply to wound bed as instructed Secondary Dressing Secured With Transpore Surgical Tape, 2x10 (in/yd) Discharge Instruction: Secure dressing with tape as directed. Compression Wrap ThreePress (3 layer compression wrap) Discharge Instruction: Apply three layer compression as directed. Netting, Tubular #5 Compression Stockings [place], [name] ([id number]) [id number] Page 9 of 9 Add-Ons Electronic Signature(s) Signed: 06/16/2022 5:19:42 PM By: [name] RN Entered By: [name] on  06/14/2022 15:58:23 -------------------------------------------------------------------------------- Vitals Details Patient Name: Date of Service: [name], [name] 06/14/2022 3:45 PM Medical Record Number: [id number] Patient Account Number: [id number] Date of Birth/Sex: Treating RN: [birth year] (82 y.o. [name] Primary Care Wilbert Schouten: Terry Number Other Clinician: Referring Airanna Partin: Treating Falicia Lizotte/Extender: [name] in Treatment: 5 Vital Signs Time Taken: 14:45 Temperature (F): 97.9 Height (in): 63 Pulse (bpm): 72 Weight (lbs): 123 Respiratory Rate (breaths/min): 16 Body Mass Index (BMI): 21.8 Blood Pressure (mmHg): 122/74 Reference Range: 80 - 120 mg / dl Electronic Signature(s) Signed: 06/16/2022 5:19:42 PM By: [name] RN Entered By: [name] on 06/14/2022 16:07:28

## 2022-06-18 NOTE — Progress Notes (Signed)
Terry, Marquez ([id number]) [id number] Page 1 of 10 Visit Report for 06/14/2022 Chief Complaint Document Details Patient Name: Date of Service: [name], [name] 06/14/2022 3:45 PM Medical Record Number: [id number] Patient Account Number: [id number] Date of Birth/Sex: Treating RN: [birth year] (82 y.o. F) Primary Care Provider: [name] Number Other Clinician: Referring Provider: Treating Provider/Extender: [name] in Treatment: 5 Information Obtained from: Patient Chief Complaint Patient presents for treatment of open ulcers due to venous insufficiency Electronic Signature(s) Signed: 06/14/2022 4:29:58 PM By: [name] MD FACS Entered By: [name] on 06/14/2022 16:29:57 -------------------------------------------------------------------------------- Debridement Details Patient Name: Date of Service: Terry, [name] 06/14/2022 3:45 PM Medical Record Number: [id number] Patient Account Number: [id number] Date of Birth/Sex: Treating RN: Feb 13, 1941 (82 y.o. [name] Primary Care Provider: [name] Number Other Clinician: Referring Provider: Treating Provider/Extender: [name] in Treatment: 5 Debridement Performed for Assessment: Wound #5 Right,Anterior Lower Leg Performed By: Physician [name], MD Debridement Type: Debridement Severity of Tissue Pre Debridement: Fat layer exposed Level of Consciousness (Pre-procedure): Awake and Alert Pre-procedure Verification/Time Out Yes - 15:56 Taken: Start Time: 15:56 Pain Control: Lidocaine 5% topical ointment T Area Debrided (L x W): otal 0.9 (cm) x 0.9 (cm) = 0.81 (cm) Tissue and other material debrided: Non-Viable, Eschar, Slough, Slough Level: Non-Viable Tissue Debridement Description: Selective/Open Wound Instrument: Curette Bleeding: Minimum Hemostasis Achieved: Pressure End Time: 15:57 Procedural Pain:  0 Post Procedural Pain: 0 Response to Treatment: Procedure was tolerated well Level of Consciousness (Post- Awake and Alert procedure): Post Debridement Measurements of Total Wound Length: (cm) 0.9 Width: (cm) 0.9 Depth: (cm) 0.1 Volume: (cm) 0.064 Character of Wound/Ulcer Post Debridement: Improved Severity of Tissue Post Debridement: Fat layer exposed [name] ([id number]) [id number] Page 2 of 10 Post Procedure Diagnosis Same as Pre-procedure Notes Scribed for Dr. [name] by [name] Electronic Signature(s) Signed: 06/14/2022 4:34:30 PM By: [name] MD FACS Signed: 06/16/2022 5:19:42 PM By: [name] RN Entered By: [name] on 06/14/2022 16:10:36 -------------------------------------------------------------------------------- Debridement Details Patient Name: Date of Service: Terry, [name] 06/14/2022 3:45 PM Medical Record Number: [id number] Patient Account Number: [id number] Date of Birth/Sex: Treating RN: November 22, 1940 (82 y.o. [name] Primary Care Provider: [name] Number Other Clinician: Referring Provider: Treating Provider/Extender: [name] in Treatment: 5 Debridement Performed for Assessment: Wound #6 Left,Anterior Lower Leg Performed By: Physician [name], MD Debridement Type: Debridement Level of Consciousness (Pre-procedure): Awake and Alert Pre-procedure Verification/Time Out Yes - 15:56 Taken: Start Time: 15:56 Pain Control: Lidocaine 5% topical ointment T Area Debrided (L x W): otal 0.2 (cm) x 0.2 (cm) = 0.04 (cm) Tissue and other material debrided: Non-Viable, Eschar, Slough, Slough Level: Non-Viable Tissue Debridement Description: Selective/Open Wound Instrument: Curette Bleeding: Minimum Hemostasis Achieved: Pressure End Time: 15:57 Procedural Pain: 0 Post Procedural Pain: 0 Response to Treatment: Procedure was tolerated well Level of  Consciousness (Post- Awake and Alert procedure): Post Debridement Measurements of Total Wound Length: (cm) 0.2 Width: (cm) 0.2 Depth: (cm) 0.1 Volume: (cm) 0.003 Character of Wound/Ulcer Post Debridement: Improved Post Procedure Diagnosis Same as Pre-procedure Notes Scribed for Dr. [name] by [name] Electronic Signature(s) Signed: 06/14/2022 4:34:30 PM By: [name] MD FACS Signed: 06/16/2022 5:19:42 PM By: [name] RN Entered By: [name] on 06/14/2022 16:11:26 [name] ([id number]) [id number] Page 3 of 10 -------------------------------------------------------------------------------- HPI Details Patient Name: Date of Service: Terry, [name] 06/14/2022 3:45 PM Medical Record Number: [id number] Patient Account Number: [id number] Date of Birth/Sex: Treating  RN: [birth year] (82 y.o. F) Primary Care Provider: [name] Number Other Clinician: Referring Provider: Treating Provider/Extender: [name] in Treatment: 5 History of Present Illness HPI Description: ADMISSION 12/25/2021 This is an 82 year old woman with multiple medical comorbidities including congestive heart failure, hypertension, end-stage renal disease on hemodialysis, polyneuropathy, poorly controlled type 2 diabetes mellitus (last hemoglobin A1c available to me is from December 2022 and was 9.6). She presents to clinic today with multiple open ulcers on her bilateral lower extremities in the setting of severe edema. She says that she has compression stockings but has not worn them in some time because they are too tight. Her wounds are secondary to trauma from her wheelchair. She often sleeps in her wheelchair with her legs hanging down. ABIs in clinic today were noncompressible on the right and 0.57 on the left. She has multiple shallow ulcers on the bilateral lower extremities. She has 2+ pitting edema and her legs are extremely  tense. They are somewhat erythematous and warm. She is currently taking cephalexin prescribed by her primary care provider due to concern for cellulitis. 01/01/2022: All of her wounds are healed. Edema control with the Kerlix and Coban wraps is excellent. She does have compression stockings at home. READMISSION 05/07/2022 The patient returns with a new wound on her right lower anterior leg. She is accompanied by her daughter. They admit that she has not been wearing her compression stockings at home. The patient's daughter reports that this wound started as a blister that ruptured. She has been applying peroxide. Both indicate that the patient is still frequently sleeping in her wheelchair with her legs dependent. ABI in clinic today was 1.08. The wound is small, just medial to the anterior tibial surface, clean. 06/07/2022: The patient has failed to show up for multiple clinic visits. The patient's daughter says that she simply refuses to come when its time for her appointments. She continues to be quite stubborn about sleeping in her wheelchair with her legs dependent. The wound on her right anterior tibial surface is shallow and just has a bit of eschar buildup. 06/14/2022: She has a new wound on her left anterior tibial surface. It has some slough accumulation. The wound on her right leg has slough and eschar buildup. Edema control is much better this week. Electronic Signature(s) Signed: 06/14/2022 4:30:42 PM By: [name] MD FACS Entered By: [name] on 06/14/2022 16:30:41 -------------------------------------------------------------------------------- Physical Exam Details Patient Name: Date of Service: PURITY, BENET Marquez 06/14/2022 3:45 PM Medical Record Number: [id number] Patient Account Number: [id number] Date of Birth/Sex: Treating RN: 12-08-1940 (81 y.o. F) Primary Care Provider: [name] Number Other Clinician: Referring Provider: Treating Provider/Extender: [name] in Treatment: 5 Constitutional . . . . no acute distress. Respiratory Normal work of breathing on room air. Notes 06/14/2022: She has a new wound on her left anterior tibial surface. It has some slough accumulation. The wound on her right leg has slough and eschar buildup. Edema control is much better this week. Electronic Signature(s) Signed: 06/14/2022 4:31:09 PM By: [name] MD FACS [place], [name] ([id number]) PM By: [name] MD FACS [phone] Page 4 of 10 Signed: 06/14/2022 4:31:09 Entered By: [name] on 06/14/2022 16:31:09 -------------------------------------------------------------------------------- Physician Orders Details Patient Name: Date of Service: KYLANI, CONYER Marquez 06/14/2022 3:45 PM Medical Record Number: [id number] Patient Account Number: [id number] Date of Birth/Sex: Treating RN: [birth year] (82 y.o. [name] Primary Care Provider: [name] Number Other Clinician: Referring Provider: Treating Provider/Extender: [name]  Dyanne Carrel, Caryl Pina Weeks in Treatment: 5 Verbal / Phone Orders: No Diagnosis Coding Follow-up Appointments ppointment in 1 week. - Dr. [name] Room 3++++ Herbie Saxon Return A Anesthetic (In clinic) Topical Lidocaine 4% applied to wound bed - Used in clinic Bathing/ Shower/ Hygiene Other Bathing/Shower/Hygiene Orders/Instructions: - Please DO NOT GET COMPRESSION WRAPS on right leg wet. Edema Control - Lymphedema / SCD / Other Other Edema Control Orders/Instructions: - Please elevate both legs throughout the day Additional Orders / Instructions Other: - Please wear compression stocking on left leg Wound Treatment Wound #5 - Lower Leg Wound Laterality: Right, Anterior Cleanser: Soap and Water 1 x Per Week/30 Days Discharge Instructions: May shower and wash wound with dial antibacterial soap and water prior to dressing change. Cleanser: Wound  Cleanser 1 x Per Week/30 Days Discharge Instructions: Cleanse the wound with wound cleanser prior to applying a clean dressing using gauze sponges, not tissue or cotton balls. Prim Dressing: Sorbalgon AG Dressing 2x2 (in/in) 1 x Per Week/30 Days ary Discharge Instructions: Apply to wound bed as instructed Secured With: Transpore Surgical Tape, 2x10 (in/yd) 1 x Per Week/30 Days Discharge Instructions: Secure dressing with tape as directed. Compression Wrap: ThreePress (3 layer compression wrap) 1 x Per Week/30 Days Discharge Instructions: Apply three layer compression as directed. Compression Wrap: Netting, Tubular #5 1 x Per Week/30 Days Wound #6 - Lower Leg Wound Laterality: Left, Anterior Cleanser: Soap and Water 1 x Per Week/30 Days Discharge Instructions: May shower and wash wound with dial antibacterial soap and water prior to dressing change. Cleanser: Wound Cleanser 1 x Per Week/30 Days Discharge Instructions: Cleanse the wound with wound cleanser prior to applying a clean dressing using gauze sponges, not tissue or cotton balls. Prim Dressing: Sorbalgon AG Dressing 2x2 (in/in) 1 x Per Week/30 Days ary Discharge Instructions: Apply to wound bed as instructed Secured With: Transpore Surgical Tape, 2x10 (in/yd) 1 x Per Week/30 Days Discharge Instructions: Secure dressing with tape as directed. Compression Wrap: ThreePress (3 layer compression wrap) 1 x Per Week/30 Days Discharge Instructions: Apply three layer compression as directed. Compression Wrap: Netting, Tubular #5 1 x Per Week/30 Days CUSHENA, HARTZ ([id number]) [id number] Page 5 of 10 Electronic Signature(s) Signed: 06/14/2022 4:34:30 PM By: [name] MD FACS Entered By: [name] on 06/14/2022 16:31:22 -------------------------------------------------------------------------------- Problem List Details Patient Name: Date of Service: CHRYSTINA, PARGA Marquez 06/14/2022 3:45 PM Medical  Record Number: [id number] Patient Account Number: [id number] Date of Birth/Sex: Treating RN: [birth year] (82 y.o. F) Primary Care Provider: [name] Number Other Clinician: Referring Provider: Treating Provider/Extender: [name] in Treatment: 5 Active Problems ICD-10 Encounter Code Description Active Date MDM Diagnosis [id number] Non-pressure chronic ulcer of other part of right lower leg with fat layer 05/07/2022 No Yes exposed L97.822 Non-pressure chronic ulcer of other part of left lower leg with fat layer exposed2/04/2023 No Yes I50.9 Heart failure, unspecified 05/07/2022 No Yes N18.6 End stage renal disease 05/07/2022 No Yes E44.0 Moderate protein-calorie malnutrition 05/07/2022 No Yes E11.42 Type 2 diabetes mellitus with diabetic polyneuropathy 05/07/2022 No Yes I87.2 Venous insufficiency (chronic) (peripheral) 05/07/2022 No Yes Inactive Problems Resolved Problems Electronic Signature(s) Signed: 06/14/2022 4:28:31 PM By: [name] MD FACS Entered By: [name] on 06/14/2022 16:28:31 Progress Note Details -------------------------------------------------------------------------------- [name] ([id number]) [id number] Page 6 of 10 Patient Name: Date of Service: ETHELEEN, VANDENBOOM Marquez 06/14/2022 3:45 PM Medical Record Number: [id number] Patient Account Number: [id number] Date of Birth/Sex: Treating RN: 1940-12-04 (82 y.o. F) Primary Care Provider: Smith Mince,  Caryl Pina Other Clinician: Referring Provider: Treating Provider/Extender: [name] in Treatment: 5 Subjective Chief Complaint Information obtained from Patient Patient presents for treatment of open ulcers due to venous insufficiency History of Present Illness (HPI) ADMISSION 12/25/2021 This is an 82 year old woman with multiple medical comorbidities including congestive heart failure, hypertension, end-stage renal disease on  hemodialysis, polyneuropathy, poorly controlled type 2 diabetes mellitus (last hemoglobin A1c available to me is from December 2022 and was 9.6). She presents to clinic today with multiple open ulcers on her bilateral lower extremities in the setting of severe edema. She says that she has compression stockings but has not worn them in some time because they are too tight. Her wounds are secondary to trauma from her wheelchair. She often sleeps in her wheelchair with her legs hanging down. ABIs in clinic today were noncompressible on the right and 0.57 on the left. She has multiple shallow ulcers on the bilateral lower extremities. She has 2+ pitting edema and her legs are extremely tense. They are somewhat erythematous and warm. She is currently taking cephalexin prescribed by her primary care provider due to concern for cellulitis. 01/01/2022: All of her wounds are healed. Edema control with the Kerlix and Coban wraps is excellent. She does have compression stockings at home. READMISSION 05/07/2022 The patient returns with a new wound on her right lower anterior leg. She is accompanied by her daughter. They admit that she has not been wearing her compression stockings at home. The patient's daughter reports that this wound started as a blister that ruptured. She has been applying peroxide. Both indicate that the patient is still frequently sleeping in her wheelchair with her legs dependent. ABI in clinic today was 1.08. The wound is small, just medial to the anterior tibial surface, clean. 06/07/2022: The patient has failed to show up for multiple clinic visits. The patient's daughter says that she simply refuses to come when its time for her appointments. She continues to be quite stubborn about sleeping in her wheelchair with her legs dependent. The wound on her right anterior tibial surface is shallow and just has a bit of eschar buildup. 06/14/2022: She has a new wound on her left anterior tibial  surface. It has some slough accumulation. The wound on her right leg has slough and eschar buildup. Edema control is much better this week. Patient History Information obtained from Patient, Caregiver, Chart. Family History Heart Disease - Mother,Father, Hypertension - Mother,Father, No family history of Cancer, Diabetes, Hereditary Spherocytosis, Kidney Disease, Lung Disease, Seizures, Stroke, Thyroid Problems, Tuberculosis. Social History Never smoker, Marital Status - Divorced, Alcohol Use - Never, Drug Use - No History, Caffeine Use - Daily. Medical History Eyes Patient has history of Cataracts - bil removed Denies history of Glaucoma, Optic Neuritis Hematologic/Lymphatic Denies history of Anemia Cardiovascular Patient has history of Arrhythmia - afib, Congestive Heart Failure, Hypertension Endocrine Patient has history of Type II Diabetes Denies history of Type I Diabetes Genitourinary Patient has history of End Stage Renal Disease - HemoDialysis Integumentary (Skin) Denies history of History of Burn Neurologic Patient has history of Neuropathy Oncologic Denies history of Received Chemotherapy, Received Radiation Psychiatric Denies history of Anorexia/bulimia, Confinement Anxiety Hospitalization/Surgery History - AV fistula plaement left arm. - tonsillectomy. Medical A Surgical History Notes nd Constitutional Symptoms (General Health) vitamin D deficiency Cardiovascular hyperlipidemia VALETA, JAREMA ([id number]) [id number] Page 7 of 10 Objective Constitutional no acute distress. Vitals Time Taken: 2:45 PM, Height: 63 in, Weight: 123 lbs, BMI: 21.8, Temperature: 97.9  F, Pulse: 72 bpm, Respiratory Rate: 16 breaths/min, Blood Pressure: 122/74 mmHg. Respiratory Normal work of breathing on room air. General Notes: 06/14/2022: She has a new wound on her left anterior tibial surface. It has some slough accumulation. The wound on her right leg  has slough and eschar buildup. Edema control is much better this week. Integumentary (Hair, Skin) Wound #5 status is Open. Original cause of wound was Blister. The date acquired was: 04/30/2022. The wound has been in treatment 5 weeks. The wound is located on the Right,Anterior Lower Leg. The wound measures 0.9cm length x 0.9cm width x 0.1cm depth; 0.636cm^2 area and 0.064cm^3 volume. There is Fat Layer (Subcutaneous Tissue) exposed. There is no tunneling or undermining noted. There is a medium amount of serous drainage noted. There is large (67- 100%) pink granulation within the wound bed. There is a small (1-33%) amount of necrotic tissue within the wound bed including Adherent Slough. The periwound skin appearance exhibited: Scarring, Dry/Scaly, Hemosiderin Staining. Periwound temperature was noted as No Abnormality. Wound #6 status is Open. Original cause of wound was Other Lesion. The date acquired was: 06/12/2022. The wound is located on the Left,Anterior Lower Leg. The wound measures 0.2cm length x 0.2cm width x 0.1cm depth; 0.031cm^2 area and 0.003cm^3 volume. There is Fat Layer (Subcutaneous Tissue) exposed. There is no tunneling or undermining noted. There is a medium amount of drainage noted. There is small (1-33%) red granulation within the wound bed. There is a large (67-100%) amount of necrotic tissue within the wound bed including Eschar and Adherent Slough. The periwound skin appearance had no abnormalities noted for moisture. The periwound skin appearance had no abnormalities noted for color. The periwound skin appearance exhibited: Scarring. Periwound temperature was noted as No Abnormality. 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 There was a Selective/Open Wound Non-Viable Tissue Debridement with a total area of 0.81 sq cm performed by [name], MD. With the following instrument(s): Curette to remove Non-Viable tissue/material. Material removed includes Eschar and Slough and after achieving pain control using Lidocaine 5% topical ointment. No specimens were taken. A time out was conducted at 15:56, prior to the start of the procedure. A Minimum amount of bleeding was controlled with Pressure. The procedure was tolerated well with a pain level of 0 throughout and a pain level of 0 following the procedure. Post Debridement Measurements: 0.9cm length x 0.9cm width x 0.1cm depth; 0.064cm^3 volume. Character of Wound/Ulcer Post Debridement is improved. Severity of Tissue Post Debridement is: Fat layer exposed. Post procedure Diagnosis Wound #5: Same as Pre-Procedure General Notes: Scribed for Dr. [name] by [name]. Pre-procedure diagnosis of Wound #5 is a Venous Leg Ulcer located on the Right,Anterior Lower Leg . There was a Three Layer Compression Therapy Procedure by [name], RN. Post procedure Diagnosis Wound #5: Same as Pre-Procedure Wound #6 Pre-procedure diagnosis of Wound #6 is an Abrasion located on the Left,Anterior Lower Leg . There was a Selective/Open Wound Non-Viable Tissue Debridement with a total area of 0.04 sq cm performed by [name], MD. With the following  instrument(s): Curette to remove Non-Viable tissue/material. Material removed includes Eschar and Slough and after achieving pain control using Lidocaine 5% topical ointment. No specimens were taken. A time out was conducted at 15:56, prior to the  start of the procedure. A Minimum amount of bleeding was controlled with Pressure. The procedure was tolerated well with a pain level of 0 throughout and a pain level of 0 following the procedure. Post Debridement Measurements: 0.2cm length x 0.2cm width x 0.1cm depth; 0.003cm^3 volume. Character of Wound/Ulcer Post Debridement is improved. Post procedure Diagnosis Wound #6: Same as Pre-Procedure General Notes: Scribed for Dr. [name] by [name]. Pre-procedure diagnosis of Wound #6 is an Abrasion located on the Left,Anterior Lower Leg . There was a Three Layer Compression Therapy Procedure by [name], RN. Post procedure Diagnosis Wound #6: Same as Pre-Procedure KATHEEN, BANVILLE ([id number]) [id number] Page 8 of 10 Plan Follow-up Appointments: Return Appointment in 1 week. - Dr. [name] Room 3++++ EXTRA TIME+++++ Anesthetic: (In clinic) Topical Lidocaine 4% applied to wound bed - Used in clinic Bathing/ Shower/ Hygiene: Other Bathing/Shower/Hygiene Orders/Instructions: - Please DO NOT GET COMPRESSION WRAPS on right leg wet. Edema Control - Lymphedema / SCD / Other: Other Edema Control Orders/Instructions: - Please elevate both legs throughout the day Additional Orders / Instructions: Other: - Please wear compression stocking on left leg WOUND #5: - Lower Leg Wound Laterality: Right, Anterior Cleanser: Soap and Water 1 x Per Week/30 Days Discharge Instructions: May shower and wash wound with dial antibacterial soap and water prior to dressing change. Cleanser: Wound Cleanser 1 x Per Week/30 Days Discharge Instructions: Cleanse the wound with wound cleanser prior to applying a clean dressing using gauze sponges, not tissue or cotton balls. Prim Dressing: Sorbalgon AG Dressing 2x2 (in/in) 1 x Per Week/30 Days ary Discharge Instructions: Apply to wound bed as instructed Secured With: Transpore Surgical T ape, 2x10 (in/yd) 1 x Per Week/30 Days Discharge  Instructions: Secure dressing with tape as directed. Com pression Wrap: ThreePress (3 layer compression wrap) 1 x Per Week/30 Days Discharge Instructions: Apply three layer compression as directed. Com pression Wrap: Netting, Tubular #5 1 x Per Week/30 Days WOUND #6: - Lower Leg Wound Laterality: Left, Anterior Cleanser: Soap and Water 1 x Per Week/30 Days Discharge Instructions: May shower and wash wound with dial antibacterial soap and water prior to dressing change. Cleanser: Wound Cleanser 1 x Per Week/30 Days Discharge Instructions: Cleanse the wound with wound cleanser prior to applying a clean dressing using gauze sponges, not tissue or cotton balls. Prim Dressing: Sorbalgon AG Dressing 2x2 (in/in) 1 x Per Week/30 Days ary Discharge Instructions: Apply to wound bed as instructed Secured With: Transpore Surgical T ape, 2x10 (in/yd) 1 x Per Week/30 Days Discharge Instructions: Secure dressing with tape as directed. Com pression Wrap: ThreePress (3 layer compression wrap) 1 x Per Week/30 Days Discharge Instructions: Apply three layer compression as directed. Com pression Wrap: Netting, Tubular #5 1 x Per Week/30 Days 06/14/2022: She has a new wound on her left anterior tibial surface. It has some slough accumulation. The wound on her right leg has slough and eschar buildup. Edema control is much better this week. I used a curette to debride slough from the left anterior tibial surface and slough and eschar from the right anterior tibial surface. We will use silver alginate and bilateral 3 layer compression. Follow-up in 1 week. Electronic Signature(s) Signed: 06/14/2022 4:31:54 PM By: [name] MD FACS Entered By: [name] on 06/14/2022 16:31:54 -------------------------------------------------------------------------------- HxROS  Details Patient Name: Date of Service: KEYARRA, RIPPENTROP Marquez 06/14/2022 3:45 PM Medical Record Number: [id number] Patient Account Number:  [id number] Date of Birth/Sex: Treating RN: 04-May-1940 (82 y.o. F) Primary Care Provider: [name] Number Other Clinician: Referring Provider: Treating Provider/Extender: [name] in Treatment: 5 Information Obtained From Patient Caregiver Chart Constitutional Symptoms (General Health) Medical History: Past Medical History Notes: vitamin D deficiency Eyes BRYTTNI, WISSINK ([id number]) [id number] Page 9 of 10 Medical History: Positive for: Cataracts - bil removed Negative for: Glaucoma; Optic Neuritis Hematologic/Lymphatic Medical History: Negative for: Anemia Cardiovascular Medical History: Positive for: Arrhythmia - afib; Congestive Heart Failure; Hypertension Past Medical History Notes: hyperlipidemia Endocrine Medical History: Positive for: Type II Diabetes Negative for: Type I Diabetes Time with diabetes: approx 6 yrs Treated with: Insulin Blood sugar tested every day: Yes Tested : 2 times per day Genitourinary Medical History: Positive for: End Stage Renal Disease - HemoDialysis Integumentary (Skin) Medical History: Negative for: History of Burn Neurologic Medical History: Positive for: Neuropathy Oncologic Medical History: Negative for: Received Chemotherapy; Received Radiation Psychiatric Medical History: Negative for: Anorexia/bulimia; Confinement Anxiety HBO Extended History Items Eyes: Cataracts Immunizations Pneumococcal Vaccine: Received Pneumococcal Vaccination: No Implantable Devices Yes Hospitalization / Surgery History Type of Hospitalization/Surgery AV fistula plaement left arm tonsillectomy Family and Social History Cancer: No; Diabetes: No; Heart Disease: Yes - Mother,Father; Hereditary Spherocytosis: No; Hypertension: Yes - Mother,Father; Kidney Disease: No; Lung Disease: No; Seizures: No; Stroke: No; Thyroid Problems: No; Tuberculosis: No; Never smoker; Marital Status -  Divorced; Alcohol Use: Never; Drug Use: No History; Caffeine Use: Daily; Financial Concerns: No; Food, Clothing or Shelter Needs: No; Support System Lacking: No; Transportation Concerns: No Electronic Signature(s) Signed: 06/14/2022 4:34:30 PM By: [name] MD FACS Entered By: [name] on 06/14/2022 16:30:47 [name] ([id number]) [id number] Page 10 of 10 -------------------------------------------------------------------------------- SuperBill Details Patient Name: Date of Service: ANAT, ZINK Marquez 06/14/2022 Medical Record Number: [id number] Patient Account Number: [id number] Date of Birth/Sex: Treating RN: 1940/08/01 (82 y.o. 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) Facility Procedures : CPT4 Code: [id number] Description: [id number] - DEBRIDE WOUND 1ST 20 SQ CM OR < ICD-10 Diagnosis Description L97.812 Non-pressure chronic ulcer of other part of right lower leg with fat layer expos L97.822 Non-pressure chronic ulcer of other part of left lower leg with fat layer  expose Modifier: ed d Quantity: 1 Physician Procedures : CPT4 Code Description Modifier [id number] - WC PHYS LEVEL 3 - EST PT ICD-10 Diagnosis Description [id number] Non-pressure chronic ulcer of other part of right lower leg with fat layer exposed L97.822 Non-pressure chronic ulcer of other part of left  lower leg with fat  layer exposed E44.0 Moderate protein-calorie malnutrition I50.9 Heart failure, unspecified Quantity: 1 : [id number] - WC PHYS DEBR WO ANESTH 20 SQ CM ICD-10 Diagnosis Description [id number] Non-pressure chronic ulcer of other part of right lower leg with fat layer exposed L97.822 Non-pressure chronic ulcer of other part of left lower leg with fat layer  exposed Quantity: 1 Electronic Signature(s) Signed: 06/14/2022 4:32:24 PM By: [name]  MD FACS Entered By: [name] on 06/14/2022 16:32:23

## 2022-06-21 ENCOUNTER — Ambulatory Visit (HOSPITAL_BASED_OUTPATIENT_CLINIC_OR_DEPARTMENT_OTHER): Payer: Medicare HMO | Admitting: General Surgery

## 2022-06-21 ENCOUNTER — Telehealth: Payer: Self-pay

## 2022-06-21 ENCOUNTER — Encounter (HOSPITAL_BASED_OUTPATIENT_CLINIC_OR_DEPARTMENT_OTHER): Payer: Medicare HMO | Admitting: General Surgery

## 2022-06-21 DIAGNOSIS — I872 Venous insufficiency (chronic) (peripheral): Secondary | ICD-10-CM | POA: Diagnosis not present

## 2022-06-21 NOTE — Telephone Encounter (Signed)
Error. Duplicate encounter.

## 2022-06-21 NOTE — Telephone Encounter (Signed)
Telephone call placed to patient/daughter to attempt to reschedule missed appointment for right arm fistulogram. No answer. Left voicemail message requesting a return call.

## 2022-06-22 NOTE — Progress Notes (Signed)
RAYNNA, HOLVECK ([id number]) [id number] Page 1 of 9 Visit Report for 06/21/2022 Chief Complaint Document Details Patient Name: Date of Service: [name], [name] 06/21/2022 3:45 PM Medical Record Number: [id number] Patient Account Number: [id number] Date of Birth/Sex: Treating RN: [birth year] (82 y.o. F) Primary Care Provider: [name] Number Other Clinician: Referring Provider: Treating Provider/Extender: [name] in Treatment: 6 Information Obtained from: Patient Chief Complaint Patient presents for treatment of open ulcers due to venous insufficiency Electronic Signature(s) Signed: 06/21/2022 4:51:36 PM By: [name] MD FACS Entered By: [name] on 06/21/2022 16:51:35 -------------------------------------------------------------------------------- Debridement Details Patient Name: Date of Service: Terry, [name] 06/21/2022 3:45 PM Medical Record Number: [id number] Patient Account Number: [id number] Date of Birth/Sex: Treating RN: [birth year] (82 y.o. [name] Primary Care Provider: [name] Number Other Clinician: Referring Provider: Treating Provider/Extender: [name] in Treatment: 6 Debridement Performed for Assessment: Wound #6 Left,Anterior Lower Leg Performed By: Physician [name], MD Debridement Type: Debridement Level of Consciousness (Pre-procedure): Awake and Alert Pre-procedure Verification/Time Out Yes - 16:25 Taken: Start Time: 16:25 Pain Control: Lidocaine 4% T opical Solution T Area Debrided (L x W): otal 0.1 (cm) x 0.1 (cm) = 0.01 (cm) Tissue and other material debrided: Non-Viable, Eschar Level: Non-Viable Tissue Debridement Description: Selective/Open Wound Instrument: Curette Bleeding: Minimum Hemostasis Achieved: Pressure End Time: 16:26 Procedural Pain: 0 Post Procedural Pain: 0 Response to Treatment: Procedure was tolerated  well Level of Consciousness (Post- Awake and Alert procedure): Post Debridement Measurements of Total Wound Length: (cm) 0.1 Width: (cm) 0.1 Depth: (cm) 0.1 Volume: (cm) 0.001 Character of Wound/Ulcer Post Debridement: Improved Post Procedure Diagnosis SERENTIY, HOSBACH ([id number]) [id number] Page 2 of 9 Same as Pre-procedure Notes Scribed for Dr. [name] by [name] Electronic Signature(s) Signed: 06/21/2022 5:01:55 PM By: [name] MD FACS Signed: 06/21/2022 5:41:58 PM By: [name] RN Entered By: [name] on 06/21/2022 16:33:34 -------------------------------------------------------------------------------- HPI Details Patient Name: Date of Service: Terry, [name] 06/21/2022 3:45 PM Medical Record Number: [id number] Patient Account Number: [id number] Date of Birth/Sex: Treating RN: May 19, 1940 (82 y.o. F) Primary Care Provider: [name] Number Other Clinician: Referring Provider: Treating Provider/Extender: [name] in Treatment: 6 History of Present Illness HPI Description: ADMISSION 12/25/2021 This is an 82 year old woman with multiple medical comorbidities including congestive heart failure, hypertension, end-stage renal disease on hemodialysis, polyneuropathy, poorly controlled type 2 diabetes mellitus (last hemoglobin A1c available to me is from December 2022 and was 9.6). She presents to clinic today with multiple open ulcers on her bilateral lower extremities in the setting of severe edema. She says that she has compression stockings but has not worn them in some time because they are too tight. Her wounds are secondary to trauma from her wheelchair. She often sleeps in her wheelchair with her legs hanging down. ABIs in clinic today were noncompressible on the right and 0.57 on the left. She has multiple shallow ulcers on the bilateral lower extremities. She has 2+ pitting edema and her legs are  extremely tense. They are somewhat erythematous and warm. She is currently taking cephalexin prescribed by her primary care provider due to concern for cellulitis. 01/01/2022: All of her wounds are healed. Edema control with the Kerlix and Coban wraps is excellent. She does have compression stockings at home. READMISSION 05/07/2022 The patient returns with a new wound on her right lower anterior leg. She is accompanied by her daughter. They admit that she has not been wearing her compression stockings at  home. The patient's daughter reports that this wound started as a blister that ruptured. She has been applying peroxide. Both indicate that the patient is still frequently sleeping in her wheelchair with her legs dependent. ABI in clinic today was 1.08. The wound is small, just medial to the anterior tibial surface, clean. 06/07/2022: The patient has failed to show up for multiple clinic visits. The patient's daughter says that she simply refuses to come when its time for her appointments. She continues to be quite stubborn about sleeping in her wheelchair with her legs dependent. The wound on her right anterior tibial surface is shallow and just has a bit of eschar buildup. 06/14/2022: She has a new wound on her left anterior tibial surface. It has some slough accumulation. The wound on her right leg has slough and eschar buildup. Edema control is much better this week. 06/21/2022: The wound on her right leg is healed. The wound on her left anterior tibial surface is superficial with just a layer of eschar on the surface. Edema control is very good bilaterally. Electronic Signature(s) Signed: 06/21/2022 4:52:09 PM By: [name] MD FACS Entered By: [name] on 06/21/2022 16:52:09 -------------------------------------------------------------------------------- Physical Exam Details Patient Name: Date of Service: Marquez, [name] 06/21/2022 3:45 PM Medical Record Number: [id number] Patient  Account Number: [id number] Date of Birth/Sex: Treating RN: [birth year] (82 y.o. F) Primary Care Provider: [name] Number Other Clinician: Referring Provider: Treating Provider/Extender: Lenice Pressman Stilwell, [name] ([id number]) [id number] Page 3 of 9 Weeks in Treatment: 6 Constitutional . . . . no acute distress. Respiratory Normal work of breathing on room air. Notes 06/21/2022: The wound on her right leg is healed. The wound on her left anterior tibial surface is superficial with just a layer of eschar on the surface. Edema control is very good bilaterally. Electronic Signature(s) Signed: 06/21/2022 4:52:48 PM By: [name] MD FACS Entered By: [name] on 06/21/2022 16:52:48 -------------------------------------------------------------------------------- Physician Orders Details Patient Name: Date of Service: JAPERA, WAHLS Marquez 06/21/2022 3:45 PM Medical Record Number: [id number] Patient Account Number: [id number] Date of Birth/Sex: Treating RN: 11/01/1940 (81 y.o. [name] Primary Care Provider: [name] Number Other Clinician: Referring Provider: Treating Provider/Extender: [name] in Treatment: 6 Verbal / Phone Orders: No Diagnosis Coding ICD-10 Coding Code Description [phone] Non-pressure chronic ulcer of other part of left lower leg with fat layer exposed I50.9 Heart failure, unspecified N18.6 End stage renal disease E44.0 Moderate protein-calorie malnutrition E11.42 Type 2 diabetes mellitus with diabetic polyneuropathy I87.2 Venous insufficiency (chronic) (peripheral) Follow-up Appointments ppointment in 1 week. - Dr. [name] Room 3++++ Herbie Saxon Return A Anesthetic (In clinic) Topical Lidocaine 4% applied to wound bed - Used in clinic Bathing/ Shower/ Hygiene Other Bathing/Shower/Hygiene Orders/Instructions: - Please DO NOT GET COMPRESSION WRAPS on right leg  wet. Edema Control - Lymphedema / SCD / Other Other Edema Control Orders/Instructions: - Please elevate both legs throughout the day Additional Orders / Instructions Other: - Please wear compression stocking on left leg Wound Treatment Wound #6 - Lower Leg Wound Laterality: Left, Anterior Cleanser: Soap and Water 1 x Per Week/30 Days Discharge Instructions: May shower and wash wound with dial antibacterial soap and water prior to dressing change. Cleanser: Wound Cleanser 1 x Per Week/30 Days Discharge Instructions: Cleanse the wound with wound cleanser prior to applying a clean dressing using gauze sponges, not tissue or cotton balls. Prim Dressing: Sorbalgon AG Dressing 2x2 (in/in) 1 x Per Week/30 Days ary Discharge Instructions: Apply  to wound bed as instructed Secured With: Transpore Surgical Tape, 2x10 (in/yd) 1 x Per Week/30 Days Discharge Instructions: Secure dressing with tape as directed. MEIKO, DEFELICE ([id number]) [id number] Page 4 of 9 Compression Wrap: ThreePress (3 layer compression wrap) 1 x Per Week/30 Days Discharge Instructions: Apply three layer compression as directed. Compression Wrap: Netting, Tubular #5 1 x Per Week/30 Days Electronic Signature(s) Signed: 06/21/2022 5:01:55 PM By: [name] MD FACS Entered By: [name] on 06/21/2022 16:53:06 -------------------------------------------------------------------------------- Problem List Details Patient Name: Date of Service: LORENNA, GURMAN Marquez 06/21/2022 3:45 PM Medical Record Number: [id number] Patient Account Number: [id number] Date of Birth/Sex: Treating RN: Jul 10, 1940 (82 y.o. F) Primary Care Provider: [name] Number Other Clinician: Referring Provider: Treating Provider/Extender: [name] in Treatment: 6 Active Problems ICD-10 Encounter Code Description Active Date MDM Diagnosis [phone] Non-pressure chronic ulcer of other part of left  lower leg with fat layer exposed2/04/2023 No Yes I50.9 Heart failure, unspecified 05/07/2022 No Yes N18.6 End stage renal disease 05/07/2022 No Yes E44.0 Moderate protein-calorie malnutrition 05/07/2022 No Yes E11.42 Type 2 diabetes mellitus with diabetic polyneuropathy 05/07/2022 No Yes I87.2 Venous insufficiency (chronic) (peripheral) 05/07/2022 No Yes Inactive Problems Resolved Problems ICD-10 Code Description Active Date Resolved Date L97.812 Non-pressure chronic ulcer of other part of right lower leg with fat layer exposed 05/07/2022 05/07/2022 Electronic Signature(s) Signed: 06/21/2022 4:51:18 PM By: [name] MD FACS Entered By: [name] on 06/21/2022 16:51:18 [name] ([id number]) [id number] Page 5 of 9 -------------------------------------------------------------------------------- Progress Note Details Patient Name: Date of Service: FREDIE, GUDMUNDSON Marquez 06/21/2022 3:45 PM Medical Record Number: [id number] Patient Account Number: [id number] Date of Birth/Sex: Treating RN: 1940-05-06 (82 y.o. F) Primary Care Provider: [name] Number Other Clinician: Referring Provider: Treating Provider/Extender: [name] in Treatment: 6 Subjective Chief Complaint Information obtained from Patient Patient presents for treatment of open ulcers due to venous insufficiency History of Present Illness (HPI) ADMISSION 12/25/2021 This is an 82 year old woman with multiple medical comorbidities including congestive heart failure, hypertension, end-stage renal disease on hemodialysis, polyneuropathy, poorly controlled type 2 diabetes mellitus (last hemoglobin A1c available to me is from December 2022 and was 9.6). She presents to clinic today with multiple open ulcers on her bilateral lower extremities in the setting of severe edema. She says that she has compression stockings but has not worn them in some time because they are too tight.  Her wounds are secondary to trauma from her wheelchair. She often sleeps in her wheelchair with her legs hanging down. ABIs in clinic today were noncompressible on the right and 0.57 on the left. She has multiple shallow ulcers on the bilateral lower extremities. She has 2+ pitting edema and her legs are extremely tense. They are somewhat erythematous and warm. She is currently taking cephalexin prescribed by her primary care provider due to concern for cellulitis. 01/01/2022: All of her wounds are healed. Edema control with the Kerlix and Coban wraps is excellent. She does have compression stockings at home. READMISSION 05/07/2022 The patient returns with a new wound on her right lower anterior leg. She is accompanied by her daughter. They admit that she has not been wearing her compression stockings at home. The patient's daughter reports that this wound started as a blister that ruptured. She has been applying peroxide. Both indicate that the patient is still frequently sleeping in her wheelchair with her legs dependent. ABI in clinic today was 1.08. The wound is small, just medial to the anterior tibial surface, clean.  06/07/2022: The patient has failed to show up for multiple clinic visits. The patient's daughter says that she simply refuses to come when its time for her appointments. She continues to be quite stubborn about sleeping in her wheelchair with her legs dependent. The wound on her right anterior tibial surface is shallow and just has a bit of eschar buildup. 06/14/2022: She has a new wound on her left anterior tibial surface. It has some slough accumulation. The wound on her right leg has slough and eschar buildup. Edema control is much better this week. 06/21/2022: The wound on her right leg is healed. The wound on her left anterior tibial surface is superficial with just a layer of eschar on the surface. Edema control is very good bilaterally. Patient History Information obtained from  Patient, Caregiver, Chart. Family History Heart Disease - Mother,Father, Hypertension - Mother,Father, No family history of Cancer, Diabetes, Hereditary Spherocytosis, Kidney Disease, Lung Disease, Seizures, Stroke, Thyroid Problems, Tuberculosis. Social History Never smoker, Marital Status - Divorced, Alcohol Use - Never, Drug Use - No History, Caffeine Use - Daily. Medical History Eyes Patient has history of Cataracts - bil removed Denies history of Glaucoma, Optic Neuritis Hematologic/Lymphatic Denies history of Anemia Cardiovascular Patient has history of Arrhythmia - afib, Congestive Heart Failure, Hypertension Endocrine Patient has history of Type II Diabetes Denies history of Type I Diabetes Genitourinary Patient has history of End Stage Renal Disease - HemoDialysis Integumentary (Skin) Denies history of History of Burn Neurologic Patient has history of Neuropathy Oncologic Denies history of Received Chemotherapy, Received Radiation Psychiatric Denies history of [place] [place] [address] DARIEN, STANDRIDGE ([id number]) [id number] Page 6 of 9 Hospitalization/Surgery History - AV fistula plaement left arm. - tonsillectomy. Medical A Surgical History Notes nd Constitutional Symptoms (General Health) vitamin D deficiency Cardiovascular hyperlipidemia Objective Constitutional no acute distress. Vitals Time Taken: 4:14 AM, Height: 63 in, Weight: 123 lbs, BMI: 21.8, Temperature: 97.9 F, Pulse: 76 bpm, Respiratory Rate: 18 breaths/min, Blood Pressure: 131/75 mmHg, Capillary Blood Glucose: 120 mg/dl. Respiratory Normal work of breathing on room air. General Notes: 06/21/2022: The wound on her right leg is healed. The wound on her left anterior tibial surface is superficial with just a layer of eschar on the surface. Edema control is very good bilaterally. Integumentary (Hair, Skin) Wound #5 status is Healed - Epithelialized. Original  cause of wound was Blister. The date acquired was: 04/30/2022. The wound has been in treatment 6 weeks. The wound is located on the Right,Anterior Lower Leg. The wound measures 0cm length x 0cm width x 0cm depth; 0cm^2 area and 0cm^3 volume. There is no tunneling or undermining noted. There is a none present amount of drainage noted. There is no granulation within the wound bed. There is no necrotic tissue within the wound bed. The periwound skin appearance had no abnormalities noted for texture. The periwound skin appearance had no abnormalities noted for moisture. The periwound skin appearance had no abnormalities noted for color. Periwound temperature was noted as No Abnormality. Wound #6 status is Open. Original cause of wound was Other Lesion. The date acquired was: 06/12/2022. The wound has been in treatment 1 weeks. The wound is located on the Left,Anterior Lower Leg. The wound measures 0.1cm length x 0.1cm width x 0.1cm depth; 0.008cm^2 area and 0.001cm^3 volume. There is Fat Layer (Subcutaneous Tissue) exposed. There is no tunneling or undermining noted. There is a medium amount of drainage noted. There is small (1-33%) pink granulation within the wound bed. There is a large (67-100%)  amount of necrotic tissue within the wound bed including Eschar and Adherent Slough. The periwound skin appearance had no abnormalities noted for moisture. The periwound skin appearance had no abnormalities noted for color. The periwound skin appearance exhibited: Scarring. Periwound temperature was noted as No Abnormality. 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 There was a Selective/Open Wound Non-Viable Tissue Debridement with a total area of 0.01 sq cm performed by [name], MD. With the following instrument(s): Curette to remove Non-Viable tissue/material. Material removed includes Eschar after achieving pain control using Lidocaine 4% Topical Solution. No specimens were taken. A time out was conducted at 16:25, prior to the start of the procedure. A Minimum amount of bleeding was controlled with Pressure. The procedure was tolerated well with a pain level of 0 throughout and a pain level of 0 following the procedure. Post Debridement Measurements: 0.1cm length x 0.1cm width x 0.1cm depth; 0.001cm^3 volume. Character of Wound/Ulcer Post Debridement is improved. Post procedure Diagnosis Wound #6: Same as Pre-Procedure General Notes: Scribed for Dr. [name] by [name]. Pre-procedure diagnosis of Wound #6 is an Abrasion located on the Left,Anterior Lower Leg . There was a Three Layer Compression Therapy Procedure by [name], RN. Post procedure Diagnosis Wound #6: Same as Pre-Procedure ROCHELL, EWBANK ([id number]) [id number] Page 7 of 9 Plan Follow-up Appointments: Return Appointment in 1 week. - Dr. [name] Room 3++++ EXTRA TIME+++++ Anesthetic: (In clinic) Topical Lidocaine 4% applied to wound bed - Used in clinic Bathing/ Shower/ Hygiene: Other Bathing/Shower/Hygiene Orders/Instructions: - Please DO NOT GET COMPRESSION WRAPS on right leg wet. Edema Control - Lymphedema / SCD / Other: Other Edema Control Orders/Instructions: - Please elevate both legs throughout the day Additional Orders / Instructions: Other: - Please wear compression stocking on left leg WOUND #6: - Lower Leg Wound Laterality: Left, Anterior Cleanser: Soap and Water 1 x Per Week/30 Days Discharge Instructions: May shower and wash wound with dial antibacterial soap and water prior to dressing change. Cleanser:  Wound Cleanser 1 x Per Week/30 Days Discharge Instructions: Cleanse the wound with wound cleanser prior to applying a clean dressing using gauze sponges, not tissue or cotton balls. Prim Dressing: Sorbalgon AG Dressing 2x2 (in/in) 1 x Per Week/30 Days ary Discharge Instructions: Apply to wound bed as instructed Secured With: Transpore Surgical T ape, 2x10 (in/yd) 1 x Per Week/30 Days Discharge Instructions: Secure dressing with tape as directed. Com pression Wrap: ThreePress (3 layer compression wrap) 1 x Per Week/30 Days Discharge Instructions: Apply three layer compression as directed. Com pression Wrap: Netting, Tubular #5 1 x Per Week/30 Days 06/21/2022: The wound on her right leg is healed. The wound on her left anterior tibial surface is superficial with just a layer of eschar on the surface. Edema control is very good bilaterally. I used a curette to debride the eschar off of the left anterior tibial wound. We will continue silver alginate and 3 layer compression. She was instructed to wear her compression stocking on the right leg. Follow-up in 1 week. Electronic Signature(s) Signed: 06/21/2022 4:55:03 PM By: [name] MD FACS Entered By: [name] on 06/21/2022 16:55:03 -------------------------------------------------------------------------------- HxROS Details Patient Name: Date of Service: DENICA, KAPLON A Marquez 06/21/2022 3:45 PM  Medical Record Number: [id number] Patient Account Number: [id number] Date of Birth/Sex: Treating RN: [birth year] (82 y.o. F) Primary Care Provider: [name] Number Other Clinician: Referring Provider: Treating Provider/Extender: [name] in Treatment: 6 Information Obtained From Patient Caregiver Chart Constitutional Symptoms (General Health) Medical History: Past Medical History Notes: vitamin D deficiency Eyes Medical History: Positive for: Cataracts - bil removed Negative for: Glaucoma; Optic  Neuritis Hematologic/Lymphatic Medical History: Negative for: Anemia Cardiovascular JADESOLA, ROSINE ([id number]) [id number] Page 8 of 9 Medical History: Positive for: Arrhythmia - afib; Congestive Heart Failure; Hypertension Past Medical History Notes: hyperlipidemia Endocrine Medical History: Positive for: Type II Diabetes Negative for: Type I Diabetes Time with diabetes: approx 6 yrs Treated with: Insulin Blood sugar tested every day: Yes Tested : 2 times per day Genitourinary Medical History: Positive for: End Stage Renal Disease - HemoDialysis Integumentary (Skin) Medical History: Negative for: History of Burn Neurologic Medical History: Positive for: Neuropathy Oncologic Medical History: Negative for: Received Chemotherapy; Received Radiation Psychiatric Medical History: Negative for: Anorexia/bulimia; Confinement Anxiety HBO Extended History Items Eyes: Cataracts Immunizations Pneumococcal Vaccine: Received Pneumococcal Vaccination: No Implantable Devices Yes Hospitalization / Surgery History Type of Hospitalization/Surgery AV fistula plaement left arm tonsillectomy Family and Social History Cancer: No; Diabetes: No; Heart Disease: Yes - Mother,Father; Hereditary Spherocytosis: No; Hypertension: Yes - Mother,Father; Kidney Disease: No; Lung Disease: No; Seizures: No; Stroke: No; Thyroid Problems: No; Tuberculosis: No; Never smoker; Marital Status - Divorced; Alcohol Use: Never; Drug Use: No History; Caffeine Use: Daily; Financial Concerns: No; Food, Clothing or Shelter Needs: No; Support System Lacking: No; Transportation Concerns: No Electronic Signature(s) Signed: 06/21/2022 5:01:55 PM By: [name] MD FACS Entered By: [name] on 06/21/2022 16:52:17 SuperBill Details -------------------------------------------------------------------------------- [name] ([id number]) [id number]  Page 9 of 9 Patient Name: Date of Service: BRIDGIT, KLIMA Marquez 06/21/2022 Medical Record Number: [id number] Patient Account Number: [id number] Date of Birth/Sex: Treating RN: 1940/06/15 (82 y.o. F) Primary Care Provider: [name] Number Other Clinician: Referring Provider: Treating Provider/Extender: [name] in Treatment: 6 Diagnosis Coding ICD-10 Codes Code Description [phone] Non-pressure chronic ulcer of other part of left lower leg with fat layer exposed I50.9 Heart failure, unspecified N18.6 End stage renal disease E44.0 Moderate protein-calorie malnutrition E11.42 Type 2 diabetes mellitus with diabetic polyneuropathy I87.2 Venous insufficiency (chronic) (peripheral) Facility Procedures CPT4 Code Description Modifier Quantity [id number] - DEBRIDE WOUND 1ST 20 SQ CM OR < 1 ICD-10 Diagnosis Description L97.822 Non-pressure chronic ulcer of other part of left lower leg with fat layer exposed Physician Procedures Quantity CPT4 Code Description Modifier [id number] - WC PHYS LEVEL 3 - EST PT 25 1 ICD-10 Diagnosis Description L97.822 Non-pressure chronic ulcer of other part of left lower leg with fat layer exposed N18.6 End stage renal disease E44.0 Moderate protein-calorie malnutrition E11.42 Type 2 diabetes mellitus with diabetic polyneuropathy [id number] - WC PHYS DEBR WO ANESTH 20 SQ CM 1 ICD-10 Diagnosis Description L97.822 Non-pressure chronic ulcer of other part of left lower leg with fat layer exposed Electronic Signature(s) Signed: 06/21/2022 4:55:24 PM By: [name] MD FACS Entered By: [name] on 06/21/2022 16:55:24

## 2022-06-22 NOTE — Progress Notes (Signed)
[name] ([id number]) [id number] Page 1 of 8 Visit Report for 06/21/2022 Arrival Information Details Patient Name: Date of Service: [name], [name] 06/21/2022 3:45 PM Medical Record Number: [id number] Patient Account Number: [id number] Date of Birth/Sex: Treating RN: [birth year] (82 y.o. F) Primary Care Dannie Hattabaugh: [name] Number Other Clinician: Referring Ashyra Cantin: Treating Samson Ralph/Extender: [name] in Treatment: 6 Visit Information History Since Last Visit All ordered tests and consults were completed: No Patient Arrived: Wheel Chair Added or deleted any medications: No Arrival Time: 16:12 Any new allergies or adverse reactions: No Accompanied By: daughter Had a fall or experienced change in No Transfer Assistance: None activities of daily living that may affect Patient Identification Verified: Yes risk of falls: Secondary Verification Process Completed: Yes Signs or symptoms of abuse/neglect since last visito No Hospitalized since last visit: No Implantable device outside of the clinic excluding No cellular tissue based products placed in the center since last visit: Pain Present Now: No Electronic Signature(s) Signed: 06/21/2022 4:47:30 PM By: Worthy Rancher Entered By: Worthy Rancher on 06/21/2022 16:13:54 -------------------------------------------------------------------------------- Compression Therapy Details Patient Name: Date of Service: [name], [name] 06/21/2022 3:45 PM Medical Record Number: [id number] Patient Account Number: [id number] Date of Birth/Sex: Treating RN: [birth year] (82 y.o. [name] Primary Care Mekiyah Gladwell: [name] Number Other Clinician: Referring Hershey Knauer: Treating Alverna Fawley/Extender: [name] in Treatment: 6 Compression Therapy Performed for Wound Assessment: Wound #6 Left,Anterior Lower Leg Performed By: Clinician [name], RN Compression  Type: Three Layer Post Procedure Diagnosis Same as Pre-procedure Electronic Signature(s) Signed: 06/21/2022 5:41:58 PM By: [name] RN Entered By: [name] on 06/21/2022 16:34:51 Frederico Hamman, [name] ([id number]) [id number] Page 2 of 8 -------------------------------------------------------------------------------- Encounter Discharge Information Details Patient Name: Date of Service: [name], [name] 06/21/2022 3:45 PM Medical Record Number: [id number] Patient Account Number: [id number] Date of Birth/Sex: Treating RN: [birth year] (82 y.o. [name] Primary Care Ignatius Kloos: [name] Number Other Clinician: Referring Jazalynn Mireles: Treating Jakeisha Stricker/Extender: [name] in Treatment: 6 Encounter Discharge Information Items Post Procedure Vitals Discharge Condition: Stable Temperature (F): 97.9 Ambulatory Status: Wheelchair Pulse (bpm): 76 Discharge Destination: Home Respiratory Rate (breaths/min): 18 Transportation: Private Auto Blood Pressure (mmHg): 131/75 Accompanied By: Daughter Schedule Follow-up Appointment: Yes Clinical Summary of Care: Patient Declined Electronic Signature(s) Signed: 06/21/2022 5:41:58 PM By: [name] RN Entered By: [name] on 06/21/2022 17:31:27 -------------------------------------------------------------------------------- Lower Extremity Assessment Details Patient Name: Date of Service: [name] 06/21/2022 3:45 PM Medical Record Number: [id number] Patient Account Number: [id number] Date of Birth/Sex: Treating RN: 04-Jan-1941 (82 y.o. [name] Primary Care Tanielle Emigh: [name] Number Other Clinician: Referring Smayan Hackbart: Treating Shardee Dieu/Extender: [name] in Treatment: 6 Edema Assessment Assessed: Shirlyn Goltz: No] [Right: No] [Left: Edema] [Right: :] Calf Left: Right: Point of Measurement: 33 cm From Medial Instep 32 cm 31.9  cm Ankle Left: Right: Point of Measurement: 9 cm From Medial Instep 20 cm 19.9 cm Vascular Assessment Pulses: Dorsalis Pedis Palpable: [Left:Yes] [Right:Yes] Electronic Signature(s) Signed: 06/21/2022 5:41:58 PM By: [name] RN Entered By: [name] on 06/21/2022 16:24:26 Multi Wound Chart Details -------------------------------------------------------------------------------- [name] ([id number]) [id number] Page 3 of 8 Patient Name: Date of Service: [name], [name] 06/21/2022 3:45 PM Medical Record Number: [id number] Patient Account Number: [id number] Date of Birth/Sex: Treating RN: 1941-02-20 (82 y.o. F) Primary Care Aysa Larivee: [name] Number Other Clinician: Referring Samiksha Pellicano: Treating Sumire Halbleib/Extender: [name] in Treatment: 6 Vital Signs Height(in): 63 Capillary Blood Glucose(mg/dl): 120 Weight(lbs):  123 Pulse(bpm): 76 Body Mass Index(BMI): 21.8 Blood Pressure(mmHg): 131/75 Temperature(F): 97.9 Respiratory Rate(breaths/min): 18 Wound Assessments Wound Number: 5 6 N/A Photos: N/A Right, Anterior Lower Leg Left, Anterior Lower Leg N/A Wound Location: Blister Other Lesion N/A Wounding Event: Venous Leg Ulcer Abrasion N/A Primary Etiology: Cataracts, Arrhythmia, Congestive Cataracts, Arrhythmia, Congestive N/A Comorbid History: Heart Failure, Hypertension, Type II Heart Failure, Hypertension, Type II Diabetes, End Stage Renal Disease, Diabetes, End Stage Renal Disease, Neuropathy Neuropathy 04/30/2022 06/12/2022 N/A Date Acquired: 6 1 N/A Weeks of Treatment: Healed - Epithelialized Open N/A Wound Status: No No N/A Wound Recurrence: 0x0x0 0.1x0.1x0.1 N/A Measurements L x W x D (cm) 0 0.008 N/A A (cm) : rea 0 0.001 N/A Volume (cm) : 100.00% 74.20% N/A % Reduction in A rea: 100.00% 66.70% N/A % Reduction in Volume: Full Thickness Without Exposed Full Thickness Without  Exposed N/A Classification: Support Structures Support Structures None Present Medium N/A Exudate A mount: None Present (0%) Small (1-33%) N/A Granulation A mount: N/A Pink N/A Granulation Quality: None Present (0%) Large (67-100%) N/A Necrotic A mount: N/A Eschar, Adherent Slough N/A Necrotic Tissue: Fascia: No Fat Layer (Subcutaneous Tissue): Yes N/A Exposed Structures: Fat Layer (Subcutaneous Tissue): No Tendon: No Muscle: No Joint: No Bone: No None N/A N/A Epithelialization: N/A Debridement - Selective/Open Wound N/A Debridement: Pre-procedure Verification/Time Out N/A 16:25 N/A Taken: N/A Lidocaine 4% Topical Solution N/A Pain Control: N/A Necrotic/Eschar N/A Tissue Debrided: N/A Non-Viable Tissue N/A Level: N/A 0.01 N/A Debridement A (sq cm): rea N/A Curette N/A Instrument: N/A Minimum N/A Bleeding: N/A Pressure N/A Hemostasis A chieved: N/A 0 N/A Procedural Pain: N/A 0 N/A Post Procedural Pain: N/A Procedure was tolerated well N/A Debridement Treatment Response: N/A 0.1x0.1x0.1 N/A Post Debridement Measurements L x W x D (cm) N/A 0.001 N/A Post Debridement Volume: (cm) Scarring: Yes Scarring: Yes N/A Periwound Skin Texture: Dry/Scaly: Yes No Abnormalities Noted N/A Periwound Skin Moisture: Hemosiderin Staining: Yes No Abnormalities Noted N/A Periwound Skin Color: No Abnormality No Abnormality N/A Temperature: N/A Compression Therapy N/A Procedures Performed: Debridement Treatment Notes GERAL, WENSTROM ([id number]) [id number] Page 4 of 8 Electronic Signature(s) Signed: 06/21/2022 4:51:25 PM By: [name] MD FACS Entered By: [name] on 06/21/2022 16:51:25 -------------------------------------------------------------------------------- Multi-Disciplinary Care Plan Details Patient Name: Date of Service: [name], [name] 06/21/2022 3:45 PM Medical Record Number: [id number] Patient Account Number:  [id number] Date of Birth/Sex: Treating RN: [birth year] (82 y.o. [name] Primary Care Jaimeson Gopal: [name] Number Other Clinician: Referring Ifeanyi Mickelson: Treating Jarae Panas/Extender: [name] in Treatment: 6 Active Inactive Wound/Skin Impairment Nursing Diagnoses: Knowledge deficit related to ulceration/compromised skin integrity Goals: Patient/caregiver will verbalize understanding of skin care regimen Date Initiated: 05/07/2022 Target Resolution Date: 08/01/2022 Goal Status: Active Interventions: Assess ulceration(s) every visit Treatment Activities: Skin care regimen initiated : 05/07/2022 Notes: Electronic Signature(s) Signed: 06/21/2022 5:41:58 PM By: [name] RN Entered By: [name] on 06/21/2022 17:30:03 -------------------------------------------------------------------------------- Pain Assessment Details Patient Name: Date of Service: [name], [name] 06/21/2022 3:45 PM Medical Record Number: [id number] Patient Account Number: [id number] Date of Birth/Sex: Treating RN: Sep 18, 1940 (82 y.o. F) Primary Care Berlin Viereck: [name] Number Other Clinician: Referring Jakhari Space: Treating Aleya Durnell/Extender: [name] in Treatment: 6 Active Problems Location of Pain Severity and Description of Pain Patient Has Paino No Site Locations [place], [place] ([id number]) [id number] Page 5 of 8 Pain Management and Medication Current Pain Management: Electronic Signature(s) Signed: 06/21/2022 4:47:30 PM By: Worthy Rancher Entered By: Worthy Rancher on 06/21/2022 16:14:59 -------------------------------------------------------------------------------- Patient/Caregiver  Education Details Patient Name: Date of Service: CHRISTEL, BIELEFELDT NN 2/19/2024andnbsp3:45 PM Medical Record Number: [id number] Patient Account Number: [id number] Date of Birth/Gender: Treating RN: 1941/01/17 (82 y.o. [name] Primary Care Physician: [name] Number Other Clinician: Referring Physician: Treating Physician/Extender: [name] in Treatment: 6 Education Assessment Education Provided To: Patient Education Topics Provided Wound/Skin Impairment: Methods: Explain/Verbal Responses: Return demonstration correctly Electronic Signature(s) Signed: 06/21/2022 5:41:58 PM By: [name] RN Entered By: [name] on 06/21/2022 17:30:16 -------------------------------------------------------------------------------- Wound Assessment Details Patient Name: Date of Service: [name], [name] 06/21/2022 3:45 PM Medical Record Number: [id number] Patient Account Number: [id number] Date of Birth/Sex: Treating RN: [birth year] (82 y.o. [name] Primary Care Thomos Domine: [name] Number Other Clinician: Referring Evertte Sones: Treating Norwin Aleman/Extender: Lenice Pressman Chapman, [name] ([id number]) [id number] Page 6 of 8 Weeks in Treatment: 6 Wound Status Wound Number: 5 Primary Venous Leg Ulcer Etiology: Wound Location: Right, Anterior Lower Leg Wound Healed - Epithelialized Wounding Event: Blister Status: Date Acquired: 04/30/2022 Comorbid Cataracts, Arrhythmia, Congestive Heart Failure, Hypertension, Weeks Of Treatment: 6 History: Type II Diabetes, End Stage Renal Disease, Neuropathy Clustered Wound: No Photos Wound Measurements Length: (cm) Width: (cm) Depth: (cm) Area: (cm) Volume: (cm) 0 % Reduction in Area: 100% 0 % Reduction in Volume: 100% 0 Epithelialization: None 0 Tunneling: No 0 Undermining: No Wound Description Classification: Full Thickness Without Exposed Support Structures Exudate Amount: None Present Foul Odor After Cleansing: No Slough/Fibrino No Wound Bed Granulation Amount: None Present (0%) Exposed Structure Necrotic Amount: None Present (0%) Fascia Exposed: No Fat Layer  (Subcutaneous Tissue) Exposed: No Tendon Exposed: No Muscle Exposed: No Joint Exposed: No Bone Exposed: No Periwound Skin Texture Texture Color No Abnormalities Noted: Yes No Abnormalities Noted: Yes Moisture Temperature / Pain No Abnormalities Noted: Yes Temperature: No Abnormality Electronic Signature(s) Signed: 06/21/2022 5:41:58 PM By: [name] RN Entered By: [name] on 06/21/2022 16:35:46 -------------------------------------------------------------------------------- Wound Assessment Details Patient Name: Date of Service: [name], [name] 06/21/2022 3:45 PM Medical Record Number: [id number] Patient Account Number: [id number] Date of Birth/Sex: Treating RN: Jan 08, 1941 (82 y.o. [name] Primary Care Asim Gersten: [name] Number Other Clinician: Referring Benjimen Kelley: Treating Cecil Vandyke/Extender: [name] in Treatment: [place], [name] ([id number]) [id number] Page 7 of 8 Wound Status Wound Number: 6 Primary Abrasion Etiology: Wound Location: Left, Anterior Lower Leg Wound Open Wounding Event: Other Lesion Status: Date Acquired: 06/12/2022 Comorbid Cataracts, Arrhythmia, Congestive Heart Failure, Hypertension, Weeks Of Treatment: 1 History: Type II Diabetes, End Stage Renal Disease, Neuropathy Clustered Wound: No Photos Wound Measurements Length: (cm) 0.1 Width: (cm) 0.1 Depth: (cm) 0.1 Area: (cm) 0.008 Volume: (cm) 0.001 % Reduction in Area: 74.2% % Reduction in Volume: 66.7% Tunneling: No Undermining: No Wound Description Classification: Full Thickness Without Exposed Support Structures Exudate Amount: Medium Foul Odor After Cleansing: No Slough/Fibrino Yes Wound Bed Granulation Amount: Small (1-33%) Exposed Structure Granulation Quality: Pink Fat Layer (Subcutaneous Tissue) Exposed: Yes Necrotic Amount: Large (67-100%) Necrotic Quality: Eschar, Adherent Slough Periwound Skin  Texture Texture Color No Abnormalities Noted: No No Abnormalities Noted: Yes Scarring: Yes Temperature / Pain Temperature: No Abnormality Moisture No Abnormalities Noted: Yes Treatment Notes Wound #6 (Lower Leg) Wound Laterality: Left, Anterior Cleanser Soap and Water Discharge Instruction: May shower and wash wound with dial antibacterial soap and water prior to dressing change. Wound Cleanser Discharge Instruction: Cleanse the wound with wound cleanser prior to applying a clean dressing using gauze sponges, not tissue or cotton balls. Peri-Wound Care Topical  Primary Dressing Sorbalgon AG Dressing 2x2 (in/in) Discharge Instruction: Apply to wound bed as instructed Secondary Dressing Secured With Transpore Surgical Tape, 2x10 (in/yd) Discharge Instruction: Secure dressing with tape as directed. Compression Wrap ThreePress (3 layer compression wrap) [name], [name] ([id number]) [id number] Page 8 of 8 Discharge Instruction: Apply three layer compression as directed. Netting, Tubular #5 Compression Stockings Add-Ons Electronic Signature(s) Signed: 06/21/2022 5:41:58 PM By: [name] RN Entered By: [name] on 06/21/2022 16:27:56 -------------------------------------------------------------------------------- Vitals Details Patient Name: Date of Service: [name], [name] 06/21/2022 3:45 PM Medical Record Number: [id number] Patient Account Number: [id number] Date of Birth/Sex: Treating RN: March 18, 1941 (82 y.o. F) Primary Care Arjuna Doeden: [name] Number Other Clinician: Referring Dreama Kuna: Treating Daleen Steinhaus/Extender: [name] in Treatment: 6 Vital Signs Time Taken: 04:14 Temperature (F): 97.9 Height (in): 63 Pulse (bpm): 76 Weight (lbs): 123 Respiratory Rate (breaths/min): 18 Body Mass Index (BMI): 21.8 Blood Pressure (mmHg): 131/75 Capillary Blood Glucose (mg/dl): 120 Reference Range: 80 - 120 mg /  dl Electronic Signature(s) Signed: 06/21/2022 4:47:30 PM By: Worthy Rancher Entered By: Worthy Rancher on 06/21/2022 16:14:51

## 2022-06-23 NOTE — Telephone Encounter (Signed)
Left message for patient/daughter at both phone numbers listed, to return call to return call to office.

## 2022-06-29 ENCOUNTER — Other Ambulatory Visit: Payer: Self-pay

## 2022-06-29 DIAGNOSIS — N186 End stage renal disease: Secondary | ICD-10-CM

## 2022-06-29 MED ORDER — SODIUM CHLORIDE 0.9 % IV SOLN: 250.0000 mL | INTRAVENOUS | Status: DC | PRN

## 2022-06-29 MED ORDER — SODIUM CHLORIDE 0.9% FLUSH: 3.0000 mL | Freq: Two times a day (BID) | INTRAVENOUS | Status: DC

## 2022-06-29 NOTE — Telephone Encounter (Signed)
Spoke with patient's daughter Lynelle Smoke. Scheduled fistulogram for 07/07/22. Instructions provided and understanding verbalized.

## 2022-06-30 ENCOUNTER — Encounter (HOSPITAL_BASED_OUTPATIENT_CLINIC_OR_DEPARTMENT_OTHER): Payer: Medicare HMO | Admitting: General Surgery

## 2022-06-30 DIAGNOSIS — I872 Venous insufficiency (chronic) (peripheral): Secondary | ICD-10-CM | POA: Diagnosis not present

## 2022-07-01 NOTE — Progress Notes (Signed)
MIEL, DOUNG ([id number]) [id number] Page 1 of 10 Visit Report for 06/30/2022 Chief Complaint Document Details Patient Name: Date of Service: [name], [name] 06/30/2022 3:30 PM Medical Record Number: [id number] Patient Account Number: [id number] Date of Birth/Sex: Treating RN: [birth year] (82 y.o. F) Primary Care Provider: [name] Number Other Clinician: Referring Provider: Treating Provider/Extender: [name] in Treatment: 7 Information Obtained from: Patient Chief Complaint Patient presents for treatment of open ulcers due to venous insufficiency Electronic Signature(s) Signed: 06/30/2022 4:04:02 PM By: [name] MD FACS Entered By: [name] on 06/30/2022 16:04:01 -------------------------------------------------------------------------------- Debridement Details Patient Name: Date of Service: [name], [name] 06/30/2022 3:30 PM Medical Record Number: [id number] Patient Account Number: [id number] Date of Birth/Sex: Treating RN: 12-13-1940 (82 y.o. [name], [name] Primary Care Provider: [name] Number Other Clinician: Referring Provider: Treating Provider/Extender: [name] in Treatment: 7 Debridement Performed for Assessment: Wound #8 Right,Distal,Anterior Lower Leg Performed By: Physician [name], MD Debridement Type: Debridement Severity of Tissue Pre Debridement: Fat layer exposed Level of Consciousness (Pre-procedure): Awake and Alert Pre-procedure Verification/Time Out Yes - 15:45 Taken: Start Time: 15:45 Pain Control: Lidocaine 4% T opical Solution T Area Debrided (L x W): otal 4.2 (cm) x 1 (cm) = 4.2 (cm) Tissue and other material debrided: Non-Viable, Slough, Biofilm, Slough Level: Non-Viable Tissue Debridement Description: Selective/Open Wound Instrument: Curette Bleeding: Minimum Hemostasis Achieved: Pressure Procedural Pain: 4 Post  Procedural Pain: 2 Response to Treatment: Procedure was tolerated well Level of Consciousness (Post- Awake and Alert procedure): Post Debridement Measurements of Total Wound Length: (cm) 4.2 Width: (cm) 1 Depth: (cm) 0.1 Volume: (cm) 0.33 Character of Wound/Ulcer Post Debridement: Improved Severity of Tissue Post Debridement: Fat layer exposed Post Procedure Diagnosis Same as Pre-procedure Notes Scribed for Dr. [name] by Baruch Gouty, RN Electronic Signature(s) Signed: 06/30/2022 4:05:52 PM By: Baruch Gouty RN, BSN [place], [name] ([id number]) [phone].pdf Page 2 of 10 Signed: 06/30/2022 4:13:19 PM By: [name] MD FACS Entered By: Baruch Gouty on 06/30/2022 15:47:43 -------------------------------------------------------------------------------- Debridement Details Patient Name: Date of Service: [name], [name] 06/30/2022 3:30 PM Medical Record Number: [id number] Patient Account Number: [id number] Date of Birth/Sex: Treating RN: [birth year] (82 y.o. [name], [name] Primary Care Provider: [name] Number Other Clinician: Referring Provider: Treating Provider/Extender: [name] in Treatment: 7 Debridement Performed for Assessment: Wound #7 Right,Proximal,Anterior Lower Leg Performed By: Physician [name], MD Debridement Type: Debridement Severity of Tissue Pre Debridement: Fat layer exposed Level of Consciousness (Pre-procedure): Awake and Alert Pre-procedure Verification/Time Out Yes - 15:45 Taken: Start Time: 15:45 Pain Control: Lidocaine 4% T opical Solution T Area Debrided (L x W): otal 2 (cm) x 1.1 (cm) = 2.2 (cm) Tissue and other material debrided: Non-Viable, Slough, Biofilm, Slough Level: Non-Viable Tissue Debridement Description: Selective/Open Wound Instrument: Curette Bleeding: Minimum Hemostasis Achieved: Pressure Procedural Pain: 4 Post Procedural Pain: 2 Response to  Treatment: Procedure was tolerated well Level of Consciousness (Post- Awake and Alert procedure): Post Debridement Measurements of Total Wound Length: (cm) 2 Width: (cm) 1.1 Depth: (cm) 0.1 Volume: (cm) 0.173 Character of Wound/Ulcer Post Debridement: Improved Severity of Tissue Post Debridement: Fat layer exposed Post Procedure Diagnosis Same as Pre-procedure Notes Scribed for Dr. [name] by Baruch Gouty, RN Electronic Signature(s) Signed: 06/30/2022 4:05:52 PM By: Baruch Gouty RN, BSN Signed: 06/30/2022 4:13:19 PM By: [name] MD FACS Entered By: Baruch Gouty on 06/30/2022 15:48:11 -------------------------------------------------------------------------------- HPI Details Patient Name: Date of Service: [name], [name] 06/30/2022 3:30 PM Medical Record  Number: [id number] Patient Account Number: [id number] Date of Birth/Sex: Treating RN: [birth year] (82 y.o. F) Primary Care Provider: [name] Number Other Clinician: Referring Provider: Treating Provider/Extender: [name] in Treatment: 7 History of Present Illness HPI Description: ADMISSION 12/25/2021 This is an 83 year old woman with multiple medical comorbidities including congestive heart failure, hypertension, end-stage renal disease on hemodialysis, polyneuropathy, poorly controlled type 2 diabetes mellitus (last hemoglobin A1c available to me is from December 2022 and was 9.6). She presents to clinic today with multiple open ulcers on her bilateral lower extremities in the setting of severe edema. She says that she has compression stockings but has not [place], [name] ([id number]) [id number] Page 3 of 10 worn them in some time because they are too tight. Her wounds are secondary to trauma from her wheelchair. She often sleeps in her wheelchair with her legs hanging down. ABIs in clinic today were noncompressible on the right and 0.57 on the left. She  has multiple shallow ulcers on the bilateral lower extremities. She has 2+ pitting edema and her legs are extremely tense. They are somewhat erythematous and warm. She is currently taking cephalexin prescribed by her primary care provider due to concern for cellulitis. 01/01/2022: All of her wounds are healed. Edema control with the Kerlix and Coban wraps is excellent. She does have compression stockings at home. READMISSION 05/07/2022 The patient returns with a new wound on her right lower anterior leg. She is accompanied by her daughter. They admit that she has not been wearing her compression stockings at home. The patient's daughter reports that this wound started as a blister that ruptured. She has been applying peroxide. Both indicate that the patient is still frequently sleeping in her wheelchair with her legs dependent. ABI in clinic today was 1.08. The wound is small, just medial to the anterior tibial surface, clean. 06/07/2022: The patient has failed to show up for multiple clinic visits. The patient's daughter says that she simply refuses to come when its time for her appointments. She continues to be quite stubborn about sleeping in her wheelchair with her legs dependent. The wound on her right anterior tibial surface is shallow and just has a bit of eschar buildup. 06/14/2022: She has a new wound on her left anterior tibial surface. It has some slough accumulation. The wound on her right leg has slough and eschar buildup. Edema control is much better this week. 06/21/2022: The wound on her right leg is healed. The wound on her left anterior tibial surface is superficial with just a layer of eschar on the surface. Edema control is very good bilaterally. 06/30/2022: Her left leg has healed. She managed to scrape her anterior tibial surface on the right and she has open abrasions extending into the fat layer. Electronic Signature(s) Signed: 06/30/2022 4:04:38 PM By: [name] MD  FACS Entered By: [name] on 06/30/2022 16:04:38 -------------------------------------------------------------------------------- Physical Exam Details Patient Name: Date of Service: [name], [name] 06/30/2022 3:30 PM Medical Record Number: [id number] Patient Account Number: [id number] Date of Birth/Sex: Treating RN: 09/06/1940 (82 y.o. F) Primary Care Provider: [name] Number Other Clinician: Referring Provider: Treating Provider/Extender: [name] in Treatment: 7 Constitutional . . . . no acute distress. Respiratory Normal work of breathing on room air. Notes 06/30/2022: Her left leg has healed. She managed to scrape her anterior tibial surface on the right and she has open abrasions extending into the fat layer. Electronic Signature(s) Signed: 06/30/2022 4:07:21 PM By: [name] MD FACS  Entered By: [name] on 06/30/2022 16:07:21 -------------------------------------------------------------------------------- Physician Orders Details Patient Name: Date of Service: [name], [name] 06/30/2022 3:30 PM Medical Record Number: [id number] Patient Account Number: [id number] Date of Birth/Sex: Treating RN: [birth year] (82 y.o. 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 - Dr. [name] Room  3 Return A Friday 3/8 @ 12:45 pm Anesthetic (In clinic) Topical Lidocaine 4% applied to wound bed - Used in clinic Bathing/ Shower/ Hygiene May shower with protection but do not get wound dressing(s) wet. Protect dressing(s) with water repellant cover (for example, large plastic bag) or a cast cover and may then take shower. Other Bathing/Shower/Hygiene Orders/Instructions: - Please DO NOT GET COMPRESSION WRAPS on right leg wet. Edema Control - Lymphedema / SCD / Other Elevate legs to the level of the heart or above for 30 minutes daily and/or when sitting for 3-4 times a day throughout the day. - throughout the day Avoid standing for long periods of time. Exercise regularly Compression stocking or Garment 20-30 mm/Hg pressure to: - left leg daily Wound Treatment Wound #7 - Lower Leg Wound Laterality: Right, Anterior, Proximal Peri-Wound Care: Sween Lotion (Moisturizing lotion) 1 x Per Week/30 Days Discharge Instructions: Apply moisturizing lotion as directed Prim Dressing: Maxorb Extra Ag+ Alginate Dressing, 4x4.75 (in/in) 1 x Per Week/30 Days ary Discharge Instructions: Apply to wound bed as instructed Secondary Dressing: Woven Gauze Sponge, Non-Sterile 4x4 in 1 x Per Week/30 Days Discharge Instructions: Apply over primary dressing as directed. Compression Wrap: ThreePress (3 layer compression wrap) 1 x Per Week/30 Days Discharge Instructions: Apply three layer compression as directed. Wound #8 - Lower Leg Wound Laterality: Right, Anterior, Distal Peri-Wound Care: Sween Lotion (Moisturizing lotion) 1 x Per Week/30 Days Discharge Instructions: Apply moisturizing lotion as directed Secondary Dressing: Woven Gauze Sponge, Non-Sterile 4x4 in 1 x Per Week/30 Days Discharge Instructions: Apply over primary dressing as directed. Compression Wrap: ThreePress (3 layer compression wrap) 1 x Per Week/30 Days Discharge Instructions: Apply three layer compression as directed. Electronic  Signature(s) Signed: 06/30/2022 4:13:19 PM By: [name] MD FACS Previous Signature: 06/30/2022 4:05:52 PM Version By: Baruch Gouty RN, BSN Entered By: [name] on 06/30/2022 16:07:41 -------------------------------------------------------------------------------- Problem List Details Patient Name: Date of Service: [name], [name] 06/30/2022 3:30 PM Medical Record Number: [id number] Patient Account Number: [id number] Date of Birth/Sex: Treating RN: 17-Oct-1940 (82 y.o. [name] Primary Care Provider: [name] Number Other Clinician: Referring Provider: Treating Provider/Extender: [name] in Treatment: 7 Active Problems ICD-10 Encounter Code Description Active Date MDM Diagnosis [phone] Non-pressure chronic ulcer of other part of right lower leg with fat layer 06/30/2022 No Yes exposed DAHLILA, HRIVNAK ([id number]) [phone].pdf Page 5 of 10 I50.9 Heart failure, unspecified 05/07/2022 No Yes N18.6 End stage renal disease 05/07/2022 No Yes E44.0 Moderate protein-calorie malnutrition 05/07/2022 No Yes E11.42 Type 2 diabetes mellitus with diabetic polyneuropathy 05/07/2022 No Yes I87.2 Venous insufficiency (chronic) (peripheral) 05/07/2022 No Yes Inactive Problems ICD-10 Code Description Active Date Inactive Date L97.822 Non-pressure chronic ulcer of other part  of left lower leg with fat layer exposed 06/14/2022 06/14/2022 Resolved Problems Electronic Signature(s) Signed: 06/30/2022 4:03:43 PM By: [name] MD FACS Entered By: [name] on 06/30/2022 16:03:43 -------------------------------------------------------------------------------- Progress Note Details Patient Name: Date of Service: [name], [name] 06/30/2022 3:30 PM Medical Record Number: [id number] Patient Account Number: [id number] Date of Birth/Sex: Treating RN: 1940/10/27 (82 y.o. F) Primary Care Provider: [name] Number Other  Clinician: Referring Provider: Treating Provider/Extender: [name] in Treatment: 7 Subjective Chief Complaint Information obtained from Patient Patient presents for treatment of open ulcers due to venous insufficiency History of Present Illness (HPI) ADMISSION 12/25/2021 This is an 82 year old woman with multiple medical comorbidities including congestive heart failure, hypertension, end-stage renal disease on hemodialysis, polyneuropathy, poorly controlled type 2 diabetes mellitus (last hemoglobin A1c available to me is from December 2022 and was 9.6). She presents to clinic today with multiple open ulcers on her bilateral lower extremities in the setting of severe edema. She says that she has compression stockings but has not worn them in some time because they are too tight. Her wounds are secondary to trauma from her wheelchair. She often sleeps in her wheelchair with her legs hanging down. ABIs in clinic today were noncompressible on the right and 0.57 on the left. She has multiple shallow ulcers on the bilateral lower extremities. She has 2+ pitting edema and her legs are extremely tense. They are somewhat erythematous and warm. She is currently taking cephalexin prescribed by her primary care provider due to concern for cellulitis. 01/01/2022: All of her wounds are healed. Edema control with the Kerlix and Coban wraps is excellent. She does have compression stockings at home. READMISSION 05/07/2022 The patient returns with a new wound on her right lower anterior leg. She is accompanied by her daughter. They admit that she has not been wearing her compression stockings at home. The patient's daughter reports that this wound started as a blister that ruptured. She has been applying peroxide. Both indicate that the patient is still frequently sleeping in her wheelchair with her legs dependent. ABI in clinic today was 1.08. The wound is small, just medial to the  anterior tibial surface, clean. 06/07/2022: The patient has failed to show up for multiple clinic visits. The patient's daughter says that she simply refuses to come when its time for her appointments. She continues to be quite stubborn about sleeping in her wheelchair with her legs dependent. The wound on her right anterior tibial surface is shallow and just has a bit of eschar buildup. KRISLYNN, ESQUIVEL ([id number]) [id number] Page 6 of 10 06/14/2022: She has a new wound on her left anterior tibial surface. It has some slough accumulation. The wound on her right leg has slough and eschar buildup. Edema control is much better this week. 06/21/2022: The wound on her right leg is healed. The wound on her left anterior tibial surface is superficial with just a layer of eschar on the surface. Edema control is very good bilaterally. 06/30/2022: Her left leg has healed. She managed to scrape her anterior tibial surface on the right and she has open abrasions extending into the fat layer. Patient History Information obtained from Patient, Caregiver, Chart. Family History Heart Disease - Mother,Father, Hypertension - Mother,Father, No family history of Cancer, Diabetes, Hereditary Spherocytosis, Kidney Disease, Lung Disease, Seizures, Stroke, Thyroid Problems, Tuberculosis. Social History Never smoker, Marital Status - Divorced, Alcohol Use - Never, Drug Use - No History, Caffeine Use - Daily. Medical History Eyes Patient has  history of Cataracts - bil removed Denies history of Glaucoma, Optic Neuritis Hematologic/Lymphatic Denies history of Anemia Cardiovascular Patient has history of Arrhythmia - afib, Congestive Heart Failure, Hypertension Endocrine Patient has history of Type II Diabetes Denies history of Type I Diabetes Genitourinary Patient has history of End Stage Renal Disease - HemoDialysis Integumentary (Skin) Denies history of History of  Burn Neurologic Patient has history of Neuropathy Oncologic Denies history of Received Chemotherapy, Received Radiation Psychiatric Denies history of Anorexia/bulimia, Confinement Anxiety Hospitalization/Surgery History - AV fistula plaement left arm. - tonsillectomy. Medical A Surgical History Notes nd Constitutional Symptoms (General Health) vitamin D deficiency Cardiovascular hyperlipidemia Objective Constitutional no acute distress. Vitals Time Taken: 3:13 PM, Height: 63 in, Weight: 123 lbs, BMI: 21.8, Temperature: 97.8 F, Pulse: 60 bpm, Respiratory Rate: 18 breaths/min, Blood Pressure: 115/52 mmHg. Respiratory Normal work of breathing on room air. General Notes: 06/30/2022: Her left leg has healed. She managed to scrape her anterior tibial surface on the right and she has open abrasions extending into the fat layer. Integumentary (Hair, Skin) Wound #6 status is Healed - Epithelialized. Original cause of wound was Other Lesion. The date acquired was: 06/12/2022. The wound has been in treatment 2 weeks. The wound is located on the Left,Anterior Lower Leg. The wound measures 0cm length x 0cm width x 0cm depth; 0cm^2 area and 0cm^3 volume. There is no tunneling or undermining noted. There is a none present amount of drainage noted. There is no granulation within the wound bed. There is no necrotic tissue within the wound bed. The periwound skin appearance had no abnormalities noted for texture. The periwound skin appearance had no abnormalities noted for moisture. The periwound skin appearance had no abnormalities noted for color. Periwound temperature was noted as No Abnormality. Wound #7 status is Open. Original cause of wound was Blister. The date acquired was: 06/30/2022. The wound is located on the Right,Proximal,Anterior Lower Leg. The wound measures 2cm length x 1.1cm width x 0.1cm depth; 1.728cm^2 area and 0.173cm^3 volume. There is Fat Layer (Subcutaneous Tissue) exposed.  There is no tunneling or undermining noted. There is a medium amount of serosanguineous drainage noted. The wound margin is flat and intact. There is large (67-100%) red granulation within the wound bed. There is a small (1-33%) amount of necrotic tissue within the wound bed including Adherent Slough. The periwound skin appearance had no abnormalities noted for texture. The periwound skin appearance had no abnormalities noted for moisture. The periwound skin appearance had no abnormalities noted for color. Wound #8 status is Open. Original cause of wound was Trauma. The date acquired was: 06/30/2022. The wound is located on the Right,Distal,Anterior Lower Leg. The wound measures 4.2cm length x 1cm width x 0.1cm depth; 3.299cm^2 area and 0.33cm^3 volume. There is Fat Layer (Subcutaneous Tissue) exposed. [name], [name] ([id number]) [id number] Page 7 of 10 There is no tunneling or undermining noted. There is a medium amount of serosanguineous drainage noted. The wound margin is flat and intact. There is large (67-100%) red granulation within the wound bed. There is a small (1-33%) amount of necrotic tissue within the wound bed including Adherent Slough. The periwound skin appearance had no abnormalities noted for texture. The periwound skin appearance had no abnormalities noted for moisture. The periwound skin appearance had no abnormalities noted for color. 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 There was a Selective/Open Wound  Non-Viable Tissue Debridement with a total area of 2.2 sq cm performed by [name], MD. With the following instrument(s): Curette to remove Non-Viable tissue/material. Material removed includes Slough and Biofilm and after achieving pain control using Lidocaine 4% T opical Solution. No specimens were taken. A time out was conducted at 15:45, prior to the start of the procedure. A Minimum amount of bleeding was controlled with Pressure. The procedure was tolerated well with a pain level of 4 throughout and a pain level of 2 following the procedure. Post Debridement Measurements: 2cm length x 1.1cm width x 0.1cm depth; 0.173cm^3 volume. Character of Wound/Ulcer Post Debridement is improved. Severity of Tissue Post Debridement is: Fat layer exposed. Post procedure Diagnosis Wound #7: Same as Pre-Procedure General Notes: Scribed for Dr. [name] by Baruch Gouty, RN. Wound #8 Pre-procedure diagnosis of Wound #8 is an Abrasion located on the Right,Distal,Anterior Lower Leg .Severity of Tissue Pre Debridement is: Fat layer exposed. There was a Selective/Open Wound Non-Viable Tissue Debridement with a total area of 4.2 sq cm performed by [name], MD. With the following instrument(s): Curette to remove Non-Viable tissue/material. Material removed includes Slough and Biofilm and after achieving pain control using Lidocaine 4% Topical Solution. No specimens were taken. A time out was conducted at 15:45, prior to the start of the procedure. A Minimum amount of bleeding was controlled with Pressure. The procedure was tolerated well with a pain level of 4 throughout and a pain level of 2 following the procedure. Post Debridement Measurements: 4.2cm length x 1cm width x 0.1cm depth; 0.33cm^3 volume. Character of Wound/Ulcer Post Debridement is improved. Severity of Tissue Post Debridement is: Fat layer exposed. Post procedure Diagnosis Wound #8: Same as Pre-Procedure General Notes: Scribed for Dr.  [name] by Baruch Gouty, RN. Pre-procedure diagnosis of Wound #8 is an Abrasion located on the Right,Distal,Anterior Lower Leg . There was a Three Layer Compression Therapy Procedure by Baruch Gouty, RN. Post procedure Diagnosis Wound #8: Same as Pre-Procedure Plan Follow-up Appointments: Return Appointment in 1 week. - Dr. [name] Room 3 Friday 3/8 @ 12:45 pm Anesthetic: (In clinic) Topical Lidocaine 4% applied to wound bed - Used in clinic Bathing/ Shower/ Hygiene: May shower with protection but do not get wound dressing(s) wet. Protect dressing(s) with water repellant cover (for example, large plastic bag) or a cast cover and may then take shower. Other Bathing/Shower/Hygiene Orders/Instructions: - Please DO NOT GET COMPRESSION WRAPS on right leg wet. Edema Control - Lymphedema / SCD / Other: Elevate legs to the level of the heart or above for 30 minutes daily and/or when sitting for 3-4 times a day throughout the day. - throughout the day Avoid standing for long periods of time. Exercise regularly Compression stocking or Garment 20-30 mm/Hg pressure to: - left leg daily WOUND #7: - Lower Leg Wound Laterality: Right, Anterior, Proximal Peri-Wound Care: Sween Lotion (Moisturizing lotion) 1 x Per Week/30 Days Discharge Instructions: Apply moisturizing lotion as directed Prim Dressing: Maxorb Extra Ag+ Alginate Dressing, 4x4.75 (in/in) 1 x Per Week/30 Days ary Discharge Instructions: Apply to wound bed as instructed Secondary Dressing: Woven Gauze Sponge, Non-Sterile 4x4 in 1 x Per Week/30 Days Discharge Instructions: Apply over primary dressing as directed. Com pression Wrap: ThreePress (3 layer compression wrap) 1 x Per Week/30 Days Discharge  Instructions: Apply three layer compression as directed. WOUND #8: - Lower Leg Wound Laterality: Right, Anterior, Distal Peri-Wound Care: Sween Lotion (Moisturizing lotion) 1 x Per Week/30 Days Discharge Instructions: Apply moisturizing lotion  as directed Secondary Dressing: Woven Gauze Sponge, Non-Sterile 4x4 in 1 x Per Week/30 Days Discharge Instructions: Apply over primary dressing as directed. Com pression Wrap: ThreePress (3 layer compression wrap) 1 x Per Week/30 Days [name], CARTAGENA ([id number]) [id number] Page 8 of 10 Discharge Instructions: Apply three layer compression as directed. 06/30/2022: Her left leg has healed. She managed to scrape her anterior tibial surface on the right and she has open abrasions extending into the fat layer. I used a curette to debride slough from the new wounds. We will use silver alginate and 3 layer compression. Follow-up in 1 week. Electronic Signature(s) Signed: 06/30/2022 4:08:07 PM By: [name] MD FACS Entered By: [name] on 06/30/2022 16:08:07 -------------------------------------------------------------------------------- HxROS Details Patient Name: Date of Service: [name], [name] 06/30/2022 3:30 PM Medical Record Number: [id number] Patient Account Number: [id number] Date of Birth/Sex: Treating RN: Oct 02, 1940 (82 y.o. F) Primary Care Provider: [name] Number Other Clinician: Referring Provider: Treating Provider/Extender: [name] in Treatment: 7 Information Obtained From Patient Caregiver Chart Constitutional Symptoms (General Health) Medical History: Past Medical History Notes: vitamin D deficiency Eyes Medical History: Positive for: Cataracts - bil removed Negative for: Glaucoma; Optic Neuritis Hematologic/Lymphatic Medical History: Negative for: Anemia Cardiovascular Medical History: Positive for: Arrhythmia - afib; Congestive Heart Failure; Hypertension Past Medical History Notes: hyperlipidemia Endocrine Medical History: Positive for: Type II Diabetes Negative for: Type I Diabetes Time with diabetes: approx 6 yrs Treated with: Insulin Blood sugar tested every day: Yes Tested : 2  times per day Genitourinary Medical History: Positive for: End Stage Renal Disease - HemoDialysis Integumentary (Skin) Medical History: Negative for: History of Burn Neurologic Medical History: Positive for: Neuropathy JUNNIE, KEMME ([id number]) [id number] Page 9 of 10 Oncologic Medical History: Negative for: Received Chemotherapy; Received Radiation Psychiatric Medical History: Negative for: Anorexia/bulimia; Confinement Anxiety HBO Extended History Items Eyes: Cataracts Immunizations Pneumococcal Vaccine: Received Pneumococcal Vaccination: No Implantable Devices Yes Hospitalization / Surgery History Type of Hospitalization/Surgery AV fistula plaement left arm tonsillectomy Family and Social History Cancer: No; Diabetes: No; Heart Disease: Yes - Mother,Father; Hereditary Spherocytosis: No; Hypertension: Yes - Mother,Father; Kidney Disease: No; Lung Disease: No; Seizures: No; Stroke: No; Thyroid Problems: No; Tuberculosis: No; Never smoker; Marital Status - Divorced; Alcohol Use: Never; Drug Use: No History; Caffeine Use: Daily; Financial Concerns: No; Food, Clothing or Shelter Needs: No; Support System Lacking: No; Transportation Concerns: No Electronic Signature(s) Signed: 06/30/2022 4:13:19 PM By: [name] MD FACS Entered By: [name] on 06/30/2022 16:05:43 -------------------------------------------------------------------------------- SuperBill Details Patient Name: Date of Service: FAVOR, MASSOTH NN 06/30/2022 Medical Record Number: [id number] Patient Account Number: [id number] Date of Birth/Sex: Treating RN: 01/13/1941 (82 y.o. 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 insufficiency (chronic) (peripheral) Facility Procedures : CPT4 Code: [id number] Description: [id number] - DEBRIDE WOUND 1ST 20 SQ CM OR < ICD-10 Diagnosis Description L97.812 Non-pressure chronic ulcer of other part of right lower leg with fat layer expos Modifier: ed Quantity: 1 Physician Procedures :  CPT4 Code Description Modifier [id number] - WC PHYS LEVEL 4 - EST PT 25 ICD-10 Diagnosis Description DANIELA, WEARY ([id number]) [id number] L97.812 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

## 2022-07-05 NOTE — Progress Notes (Signed)
[name], [name] ([id number]) [id number] Page 1 of 10 Visit Report for 06/30/2022 Arrival Information Details Patient Name: Date of Service: [name], [name] 06/30/2022 3:30 PM Medical Record Marquez: [id number] Patient Account Marquez: [id number] Date of Birth/Sex: Treating RN: [birth year] (82 y.o. F) Primary Care [name]: [name] Other Clinician: Referring [name]: Treating [name]/Extender: [name] in Treatment: 7 Visit Information History Since Last Visit Added or deleted any medications: No Patient Arrived: Wheel Chair Any new allergies or adverse reactions: No Arrival Time: 15:13 Had a fall or experienced change in No Accompanied By: Daughter activities of daily living that may affect Transfer Assistance: None risk of falls: Patient Identification Verified: Yes Signs or symptoms of abuse/neglect since last visito No Secondary Verification Process Completed: Yes Hospitalized since last visit: No Patient Requires Transmission-Based Precautions: No Implantable device outside of the clinic excluding No Patient Has Alerts: No cellular tissue based products placed in the center since last visit: Has Dressing in Place as Prescribed: Yes Has Compression in Place as Prescribed: Yes Pain Present Now: Yes Electronic Signature(s) Signed: 06/30/2022 4:05:52 PM By: Terry Gouty RN, BSN Entered By: [name] on 06/30/2022 15:33:23 -------------------------------------------------------------------------------- Compression Therapy Details Patient Name: Date of Service: [name], [name] 06/30/2022 3:30 PM Medical Record Marquez: [id number] Patient Account Marquez: [id number] Date of Birth/Sex: Treating RN: May 06, 1940 (82 y.o. [name] Primary Care [name]: [name] Other Clinician: Referring [name]: Treating [name]/Extender: [name] in Treatment: 7 Compression  Therapy Performed for Wound Assessment: Wound #8 Right,Distal,Anterior Lower Leg Performed By: Clinician Terry Gouty, RN Compression Type: Three Layer Post Procedure Diagnosis Same as Pre-procedure Electronic Signature(s) Signed: 06/30/2022 4:05:52 PM By: Terry Gouty RN, BSN Entered By: [name] on 06/30/2022 15:45:10 -------------------------------------------------------------------------------- Encounter Discharge Information Details Patient Name: Date of Service: [name], [name] 06/30/2022 3:30 PM Medical Record Marquez: [id number] Patient Account Marquez: [id number] Date of Birth/Sex: Treating RN: Oct 10, 1940 (82 y.o. [name] Primary Care [name]: [name] Other Clinician: Referring [name]: Treating [name]/Extender: [name] in Treatment: 7 Encounter Discharge Information Items Post Procedure Vitals Discharge Condition: Stable Temperature (F): 97.8 Ambulatory Status: Wheelchair Pulse (bpm): 60 Discharge Destination: Home Respiratory Rate (breaths/min): 18 Transportation: Private Auto Blood Pressure (mmHg): 115/52 [name], [name] ([id number]) [id number] Page 2 of 10 Accompanied By: daughter Schedule Follow-up Appointment: Yes Clinical Summary of Care: Patient Declined Electronic Signature(s) Signed: 06/30/2022 4:05:52 PM By: Terry Gouty RN, BSN Entered By: [name] on 06/30/2022 16:04:17 -------------------------------------------------------------------------------- Lower Extremity Assessment Details Patient Name: Date of Service: [name], [name] 06/30/2022 3:30 PM Medical Record Marquez: [id number] Patient Account Marquez: [id number] Date of Birth/Sex: Treating RN: May 28, 1940 (82 y.o. [name] Primary Care Sneha Willig: [name] Other Clinician: Referring [name]: Treating [name]/Extender: [name] in Treatment: 7 Edema  Assessment Assessed: [Left: No] [Right: No] Edema: [Left: No] [Right: Yes] Calf Left: Right: Point of Measurement: 33 cm From Medial Instep 25 cm 29 cm Ankle Left: Right: Point of Measurement: 9 cm From Medial Instep 18.5 cm 22 cm Vascular Assessment Pulses: Dorsalis Pedis Palpable: [Left:Yes] [Right:Yes] Electronic Signature(s) Signed: 06/30/2022 4:05:52 PM By: Terry Gouty RN, BSN Entered By: [name] on 06/30/2022 15:36:07 -------------------------------------------------------------------------------- Multi Wound Chart Details Patient Name: Date of Service: [name], [name] 06/30/2022 3:30 PM Medical Record Marquez: [id number] Patient Account Marquez: [id number] Date of Birth/Sex: Treating RN: January 13, 1941 (82 y.o. 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 Secondary Etiology: Extremity Extremity Cataracts, Arrhythmia, Congestive Cataracts, Arrhythmia, Congestive Cataracts, Arrhythmia, Congestive Comorbid History: Heart Failure, Hypertension, Type II Heart Failure, Hypertension, Type II Heart Failure, Hypertension, Type II Diabetes, End Stage Renal Disease, Diabetes, End Stage Renal Disease, Diabetes, End Stage Renal Disease, Neuropathy Neuropathy Neuropathy 06/12/2022 06/30/2022 06/30/2022 Date Acquired: 2 0 0 Weeks of Treatment: Healed -  Epithelialized Open Open Wound Status: No No No Wound Recurrence: 0x0x0 2x1.1x0.1 4.2x1x0.1 Measurements L x W x D (cm) 0 1.728 3.299 A (cm) : rea 0 0.173 0.33 Volume (cm) : 100.00% N/A N/A % Reduction in A rea: 100.00% N/A N/A % Reduction in Volume: Full Thickness Without Exposed Full Thickness Without Exposed Full Thickness Without Exposed Classification: Support Structures Support Structures Support Structures None Present Medium Medium Exudate A mount: N/A Serosanguineous Serosanguineous Exudate Type: N/A red, brown red, brown Exudate Color: N/A Flat and Intact Flat and Intact Wound Margin: None Present (0%) Large (67-100%) Large (67-100%) Granulation A mount: N/A Red Red Granulation Quality: None Present (0%) Small (1-33%) Small (1-33%) Necrotic A mount: Fascia: No Fat Layer (Subcutaneous Tissue): Yes Fat Layer (Subcutaneous Tissue): Yes Exposed Structures: Fat Layer (Subcutaneous Tissue): No Fascia: No Fascia: No Tendon: No Tendon: No Tendon: No Muscle: No Muscle: No Muscle: No Joint: No Joint: No Joint: No Bone: No Bone: No Bone: No Large (67-100%) Small (1-33%) Small (1-33%) Epithelialization: N/A Debridement - Selective/Open Wound Debridement - Selective/Open Wound Debridement: Pre-procedure Verification/Time Out N/A 15:45 15:45 Taken: N/A Lidocaine 4% Topical Solution Lidocaine 4% Topical Solution Pain Control: N/A [employer] Tissue Debrided: N/A Non-Viable Tissue Non-Viable Tissue Level: N/A 2.2 4.2 Debridement A (sq cm): rea N/A Curette Curette Instrument: N/A Minimum Minimum Bleeding: N/A Pressure Pressure Hemostasis A chieved: N/A 4 4 Procedural Pain: N/A 2 2 Post Procedural Pain: N/A Procedure was tolerated well Procedure was tolerated well Debridement Treatment Response: N/A 2x1.1x0.1 4.2x1x0.1 Post Debridement Measurements L x W x D (cm) N/A 0.173 0.33 Post Debridement Volume: (cm) Scarring: No No Abnormalities  Noted No Abnormalities Noted Periwound Skin Texture: No Abnormalities Noted No Abnormalities Noted No Abnormalities Noted Periwound Skin Moisture: No Abnormalities Noted No Abnormalities Noted No Abnormalities Noted Periwound Skin Color: No Abnormality N/A N/A Temperature: N/A Debridement Compression Therapy Procedures Performed: Debridement Treatment Notes Wound #7 (Lower Leg) Wound Laterality: Right, Anterior, Proximal Cleanser Peri-Wound Care Sween Lotion (Moisturizing lotion) Discharge Instruction: Apply moisturizing lotion as directed Topical Primary Dressing Maxorb Extra Ag+ Alginate Dressing, 4x4.75 (in/in) Discharge Instruction: Apply to wound bed as instructed Secondary Dressing Woven Gauze Sponge, Non-Sterile 4x4 in Discharge Instruction: Apply over primary dressing as directed. Secured With Compression Wrap ThreePress (3 layer compression wrap) INESS, FAZZONE ([id number]) [id number] Page 4 of 10 Discharge Instruction: Apply three layer compression as directed. Compression Stockings Add-Ons Wound #8 (Lower Leg) Wound Laterality: Right, Anterior, Distal Cleanser Peri-Wound Care Sween Lotion (Moisturizing lotion) Discharge Instruction: Apply moisturizing lotion as directed Topical Primary Dressing Maxorb Extra Ag+ Alginate Dressing, 4x4.75 (in/in) Discharge Instruction: Apply to wound bed as instructed Secondary Dressing Woven Gauze Sponge, Non-Sterile 4x4 in Discharge Instruction: Apply over primary dressing as directed. Secured  With Compression Wrap ThreePress (3 layer compression wrap) Discharge Instruction: Apply three layer compression as directed. Compression Stockings Add-Ons Electronic Signature(s) Signed: 06/30/2022 4:03:53 PM By: [name] MD FACS Entered By: [name] on 06/30/2022 16:03:53 -------------------------------------------------------------------------------- Multi-Disciplinary Care Plan  Details Patient Name: Date of Service: [name], [name] 06/30/2022 3:30 PM Medical Record Marquez: [id number] Patient Account Marquez: [id number] Date of Birth/Sex: Treating RN: 06-26-1940 (82 y.o. [name] Primary Care Marene Gilliam: [name] Other Clinician: Referring Herberto Ledwell: Treating Thong Feeny/Extender: [name] in Treatment: 7 Multidisciplinary Care Plan reviewed with physician Active Inactive Venous Leg Ulcer Nursing Diagnoses: Knowledge deficit related to disease process and management Potential for venous Insuffiency (use before diagnosis confirmed) Goals: Patient will maintain optimal edema control Date Initiated: 06/30/2022 Target Resolution Date: 07/28/2022 Goal Status: Active Interventions: Assess peripheral edema status every visit. Compression as ordered Treatment Activities: Therapeutic compression applied : 06/30/2022 Notes: [name], [name] ([id number]) [phone].pdf Page 5 of 10 Wound/Skin Impairment Nursing Diagnoses: Knowledge deficit related to ulceration/compromised skin integrity Goals: Patient/caregiver will verbalize understanding of skin care regimen Date Initiated: 05/07/2022 Target Resolution Date: 08/01/2022 Goal Status: Active Interventions: Assess ulceration(s) every visit Treatment Activities: Skin care regimen initiated : 05/07/2022 Notes: Electronic Signature(s) Signed: 06/30/2022 4:05:52 PM By: Terry Gouty RN, BSN Entered By: [name] on 06/30/2022 15:39:31 -------------------------------------------------------------------------------- Pain Assessment Details Patient Name: Date of Service: [name], [name] 06/30/2022 3:30 PM Medical Record Marquez: [id number] Patient Account Marquez: [id number] Date of Birth/Sex: Treating RN: 03/28/1941 (82 y.o. F) Primary Care Zenda Herskowitz: [name] Other Clinician: Referring Jabe Jeanbaptiste: Treating Carlean Crowl/Extender: [name] in Treatment: 7 Active Problems Location of Pain Severity and Description of Pain Patient Has Paino Yes Site Locations Pain Location: Pain in Ulcers With Dressing Change: No Duration of the Pain. Constant / Intermittento Intermittent Rate the pain. Current Pain Level: 5 Least Pain Level: 0 Character of Pain Describe the Pain: Aching Pain Management and Medication Current Pain Management: Other: tolerable Is the Current Pain Management Adequate: Adequate How does your wound impact your activities of daily livingo Sleep: No Bathing: No Appetite: No Relationship With Others: No Bladder Continence: No Emotions: No Bowel Continence: No Work: No Toileting: No Drive: No Dressing: No Hobbies: No Electronic Signature(sAREATHER, HECKENDORN ([id number]) [id number].pdf Page 6 of 10 Signed: 06/30/2022 4:05:52 PM By: Terry Gouty RN, BSN Entered By: [name] on 06/30/2022 15:34:14 -------------------------------------------------------------------------------- Patient/Caregiver Education Details Patient Name: Date of Service: [name], [name] 2/28/2024andnbsp3:30 PM Medical Record Marquez: [id number] Patient Account Marquez: [id number] Date of Birth/Gender: Treating RN: 23-Jan-1941 (82 y.o. [name] Primary Care Physician: [name] Other Clinician: Referring Physician: Treating Physician/Extender: [name] in Treatment: 7 Education Assessment Education Provided To: Patient Education Topics Provided Elevated Blood Sugar/ Impact on Healing: Methods: Explain/Verbal Responses: Reinforcements needed, State content correctly Venous: Methods: Explain/Verbal Responses: Reinforcements needed, State content correctly Wound/Skin Impairment: Methods: Explain/Verbal Responses: Reinforcements needed, State content correctly Electronic Signature(s) Signed: 06/30/2022 4:05:52 PM By:  Terry Gouty RN, BSN Entered By: [name] on 06/30/2022 15:40:00 -------------------------------------------------------------------------------- Wound Assessment Details Patient Name: Date of Service: [name], [name] 06/30/2022 3:30 PM Medical Record Marquez: [id number] Patient Account Marquez: [id number] Date of Birth/Sex: Treating RN: 1940/10/09 (82 y.o. F) Primary Care Tonya Wantz: [name] Other Clinician: Referring Maziah Smola: Treating Chandni Gagan/Extender: [name] in Treatment: 7 Wound Status Wound Marquez: 6 Primary Abrasion Etiology: Wound Location: Left, Anterior Lower Leg Wound Healed - Epithelialized Wounding Event: Other Lesion Status:  Date Acquired: 06/12/2022 Comorbid Cataracts, Arrhythmia, Congestive Heart Failure, Hypertension, Weeks Of Treatment: 2 History: Type II Diabetes, End Stage Renal Disease, Neuropathy Clustered Wound: No Photos [name], [name] ([id number]) [id number] Page 7 of 10 Wound Measurements Length: (cm) Width: (cm) Depth: (cm) Area: (cm) Volume: (cm) 0 % Reduction in Area: 100% 0 % Reduction in Volume: 100% 0 Epithelialization: Large (67-100%) 0 Tunneling: No 0 Undermining: No Wound Description Classification: Full Thickness Without Exposed Support Structures Exudate Amount: None Present Foul Odor After Cleansing: No Slough/Fibrino No Wound Bed Granulation Amount: None Present (0%) Exposed Structure Necrotic Amount: None Present (0%) Fascia Exposed: No Fat Layer (Subcutaneous Tissue) Exposed: No Tendon Exposed: No Muscle Exposed: No Joint Exposed: No Bone Exposed: No Periwound Skin Texture Texture Color No Abnormalities Noted: Yes No Abnormalities Noted: Yes Moisture Temperature / Pain No Abnormalities Noted: Yes Temperature: No Abnormality Electronic Signature(s) Signed: 06/30/2022 4:05:52 PM By: Terry Gouty RN, BSN Entered By: [name] on  06/30/2022 15:41:53 -------------------------------------------------------------------------------- Wound Assessment Details Patient Name: Date of Service: AKIRRA, NOVACK NN 06/30/2022 3:30 PM Medical Record Marquez: [id number] Patient Account Marquez: [id number] Date of Birth/Sex: Treating RN: 1940-08-09 (82 y.o. F) Primary Care Chevy Virgo: [name] Other Clinician: Referring Zyona Pettaway: Treating Sally-Anne Wamble/Extender: [name] in Treatment: 7 Wound Status Wound Marquez: 7 Primary Abrasion Etiology: Wound Location: Right, Proximal, Anterior Lower Leg Secondary Diabetic Wound/Ulcer of the Lower Extremity Wounding Event: Blister Etiology: Date Acquired: 06/30/2022 Wound Open Weeks Of Treatment: 0 Status: Clustered Wound: No Comorbid Cataracts, Arrhythmia, Congestive Heart Failure, Hypertension, History: Type II Diabetes, End Stage Renal Disease, Neuropathy Photos Wound Measurements Length: (cm) 2 Width: (cm) 1. Depth: (cm) 0. Area: (cm) 1 Volume: (cm) 0 Wellbrock, Londan ([id number]) % Reduction in Area: 1 % Reduction in Volume: 1 Epithelialization: Small (1-33%) .728 Tunneling: No .173 Undermining: No [id number] Page 8 of 10 Wound Description Classification: Full Thickness Without Exposed Support Wound Margin: Flat and Intact Exudate Amount: Medium Exudate Type: Serosanguineous Exudate Color: red, brown Structures Foul Odor After Cleansing: No Slough/Fibrino Yes Wound Bed Granulation Amount: Large (67-100%) Exposed Structure Granulation Quality: Red Fascia Exposed: No Necrotic Amount: Small (1-33%) Fat Layer (Subcutaneous Tissue) Exposed: Yes Necrotic Quality: Adherent Slough Tendon Exposed: No Muscle Exposed: No Joint Exposed: No Bone Exposed: No Periwound Skin Texture Texture Color No Abnormalities Noted: Yes No Abnormalities Noted: Yes Moisture No Abnormalities Noted: Yes Treatment Notes Wound #7  (Lower Leg) Wound Laterality: Right, Anterior, Proximal Cleanser Peri-Wound Care Sween Lotion (Moisturizing lotion) Discharge Instruction: Apply moisturizing lotion as directed Topical Primary Dressing Maxorb Extra Ag+ Alginate Dressing, 4x4.75 (in/in) Discharge Instruction: Apply to wound bed as instructed Secondary Dressing Woven Gauze Sponge, Non-Sterile 4x4 in Discharge Instruction: Apply over primary dressing as directed. Secured With Compression Wrap ThreePress (3 layer compression wrap) Discharge Instruction: Apply three layer compression as directed. Compression Stockings Add-Ons Electronic Signature(s) Signed: 06/30/2022 4:05:52 PM By: Terry Gouty RN, BSN Entered By: [name] on 06/30/2022 15:37:27 -------------------------------------------------------------------------------- Wound Assessment Details Patient Name: Date of Service: MARCEA, BRIEGER NN 06/30/2022 3:30 PM Medical Record Marquez: [id number] Patient Account Marquez: [id number] Date of Birth/Sex: Treating RN: 08/06/1940 (82 y.o. 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 Event: Trauma Etiology: Date Acquired: 06/30/2022 Wound Open NEKISHA, WHITEN ([id number]) [id number] Page 9 of 10 Wound Open Weeks  Of Treatment: 0 Status: Clustered Wound: No Comorbid Cataracts, Arrhythmia, Congestive Heart Failure, Hypertension, History: Type II Diabetes, End Stage Renal Disease, Neuropathy Photos Wound Measurements Length: (cm) 4.2 Width: (cm) 1 Depth: (cm) 0.1 Area: (cm) 3.299 Volume: (cm) 0.33 % Reduction in Area: % Reduction in Volume: Epithelialization: Small (1-33%) Tunneling:  No Undermining: No Wound Description Classification: Full Thickness Without Exposed Support Structures Wound Margin: Flat and Intact Exudate Amount: Medium Exudate Type: Serosanguineous Exudate Color: red, brown Foul Odor After Cleansing: No Slough/Fibrino Yes Wound Bed Granulation Amount: Large (67-100%) Exposed Structure Granulation Quality: Red Fascia Exposed: No Necrotic Amount: Small (1-33%) Fat Layer (Subcutaneous Tissue) Exposed: Yes Necrotic Quality: Adherent Slough Tendon Exposed: No Muscle Exposed: No Joint Exposed: No Bone Exposed: No Periwound Skin Texture Texture Color No Abnormalities Noted: Yes No Abnormalities Noted: Yes Moisture No Abnormalities Noted: Yes Treatment Notes Wound #8 (Lower Leg) Wound Laterality: Right, Anterior, Distal Cleanser Peri-Wound Care Sween Lotion (Moisturizing lotion) Discharge Instruction: Apply moisturizing lotion as directed Topical Primary Dressing Maxorb Extra Ag+ Alginate Dressing, 4x4.75 (in/in) Discharge Instruction: Apply to wound bed as instructed Secondary Dressing Woven Gauze Sponge, Non-Sterile 4x4 in Discharge Instruction: Apply over primary dressing as directed. Secured With Compression Wrap ThreePress (3 layer compression wrap) Discharge Instruction: Apply three layer compression as directed. Compression Stockings Latham, [name] ([id number]) [id number] Page 10 of 10 Add-Ons Electronic Signature(s) Signed: 06/30/2022 4:05:52 PM By: Terry Gouty RN, BSN Entered By: [name] on 06/30/2022 15:38:19 -------------------------------------------------------------------------------- [place] Details Patient Name: Date of Service: CORNEISHA, ASKELSON NN 06/30/2022 3:30 PM Medical Record Marquez: [id number] Patient Account Marquez: [id number] Date of Birth/Sex: Treating RN: Dec 23, 1940 (82 y.o. F) Primary Care Lawrie Tunks: [name] Other Clinician: Referring Neasia Fleeman: Treating  Solene Hereford/Extender: [name] in Treatment: 7 Vital Signs Time Taken: 15:13 Temperature (F): 97.8 Height (in): 63 Pulse (bpm): 60 Weight (lbs): 123 Respiratory Rate (breaths/min): 18 Body Mass Index (BMI): 21.8 Blood Pressure (mmHg): 115/52 Reference Range: 80 - 120 mg / dl Electronic Signature(s) Signed: 07/02/2022 12:33:51 PM By: Sandre Kitty Entered By: Sandre Kitty on 06/30/2022 15:14:10

## 2022-07-07 ENCOUNTER — Other Ambulatory Visit: Payer: Self-pay

## 2022-07-07 ENCOUNTER — Ambulatory Visit (HOSPITAL_COMMUNITY)
Admission: RE | Disposition: A | Discharge status: Home / Self Care | Payer: Self-pay | Source: Home / Self Care | Attending: Vascular Surgery

## 2022-07-07 ENCOUNTER — Encounter (HOSPITAL_COMMUNITY): Payer: Self-pay | Admitting: Vascular Surgery

## 2022-07-07 ENCOUNTER — Ambulatory Visit: Payer: Medicare HMO | Admitting: Internal Medicine

## 2022-07-07 ENCOUNTER — Ambulatory Visit (HOSPITAL_COMMUNITY)
Admission: RE | Admit: 2022-07-07 | Discharge: 2022-07-07 | Disposition: A | Discharge status: Home / Self Care | Payer: Medicare HMO | Attending: Vascular Surgery | Admitting: Vascular Surgery

## 2022-07-07 DIAGNOSIS — T82858A Stenosis of vascular prosthetic devices, implants and grafts, initial encounter: Secondary | ICD-10-CM | POA: Insufficient documentation

## 2022-07-07 DIAGNOSIS — N185 Chronic kidney disease, stage 5: Secondary | ICD-10-CM

## 2022-07-07 DIAGNOSIS — Y832 Surgical operation with anastomosis, bypass or graft as the cause of abnormal reaction of the patient, or of later complication, without mention of misadventure at the time of the procedure: Secondary | ICD-10-CM | POA: Diagnosis not present

## 2022-07-07 DIAGNOSIS — T82898A Other specified complication of vascular prosthetic devices, implants and grafts, initial encounter: Secondary | ICD-10-CM | POA: Diagnosis not present

## 2022-07-07 DIAGNOSIS — N186 End stage renal disease: Secondary | ICD-10-CM

## 2022-07-07 HISTORY — PX: A/V FISTULAGRAM: CATH118298

## 2022-07-07 HISTORY — PX: A/V SHUNT INTERVENTION: CATH118220

## 2022-07-07 LAB — POCT I-STAT, CHEM 8
BUN: 28 mg/dL — ABNORMAL HIGH (ref 8–23)
Calcium, Ion: 1.13 mmol/L — ABNORMAL LOW (ref 1.15–1.40)
Chloride: 100 mmol/L (ref 98–111)
Creatinine, Ser: 2.6 mg/dL — ABNORMAL HIGH (ref 0.44–1.00)
Glucose, Bld: 113 mg/dL — ABNORMAL HIGH (ref 70–99)
HCT: 34 % — ABNORMAL LOW (ref 36.0–46.0)
Hemoglobin: 11.6 g/dL — ABNORMAL LOW (ref 12.0–15.0)
Potassium: 4.1 mmol/L (ref 3.5–5.1)
Sodium: 138 mmol/L (ref 135–145)
TCO2: 29 mmol/L (ref 22–32)

## 2022-07-07 LAB — GLUCOSE, CAPILLARY: Glucose-Capillary: 110 mg/dL — ABNORMAL HIGH (ref 70–99)

## 2022-07-07 SURGERY — A/V FISTULAGRAM
Anesthesia: LOCAL | Laterality: Right

## 2022-07-07 MED ORDER — IODIXANOL 320 MG/ML IV SOLN
INTRAVENOUS | Status: DC | PRN
  Administered 2022-07-07: 50 mL

## 2022-07-07 MED ORDER — LABETALOL HCL 5 MG/ML IV SOLN: 10.0000 mg | INTRAVENOUS | Status: DC | PRN

## 2022-07-07 MED ORDER — HEPARIN SODIUM (PORCINE) 1000 UNIT/ML IJ SOLN
INTRAMUSCULAR | Status: DC | PRN
  Administered 2022-07-07 (×2): 3000 [IU] via INTRAVENOUS

## 2022-07-07 MED ORDER — MIDAZOLAM HCL 2 MG/2ML IJ SOLN
INTRAMUSCULAR | Status: DC | PRN
  Administered 2022-07-07: .5 mg via INTRAVENOUS

## 2022-07-07 MED ORDER — SODIUM CHLORIDE 0.9% FLUSH: 3.0000 mL | INTRAVENOUS | Status: DC | PRN

## 2022-07-07 MED ORDER — FENTANYL CITRATE (PF) 100 MCG/2ML IJ SOLN
INTRAMUSCULAR | Status: AC
  Filled 2022-07-07: qty 2

## 2022-07-07 MED ORDER — SODIUM CHLORIDE 0.9% FLUSH: 3.0000 mL | Freq: Two times a day (BID) | INTRAVENOUS | Status: DC

## 2022-07-07 MED ORDER — ACETAMINOPHEN 325 MG PO TABS: 650.0000 mg | ORAL_TABLET | ORAL | Status: DC | PRN

## 2022-07-07 MED ORDER — FENTANYL CITRATE (PF) 100 MCG/2ML IJ SOLN
INTRAMUSCULAR | Status: DC | PRN
  Administered 2022-07-07: 12.5 ug via INTRAVENOUS

## 2022-07-07 MED ORDER — LIDOCAINE HCL (PF) 1 % IJ SOLN
INTRAMUSCULAR | Status: AC
  Filled 2022-07-07: qty 30

## 2022-07-07 MED ORDER — HYDRALAZINE HCL 20 MG/ML IJ SOLN: 5.0000 mg | INTRAMUSCULAR | Status: DC | PRN

## 2022-07-07 MED ORDER — LIDOCAINE HCL (PF) 1 % IJ SOLN
INTRAMUSCULAR | Status: DC | PRN
  Administered 2022-07-07: 2 mL

## 2022-07-07 MED ORDER — MIDAZOLAM HCL 5 MG/5ML IJ SOLN
INTRAMUSCULAR | Status: AC
  Filled 2022-07-07: qty 5

## 2022-07-07 MED ORDER — SODIUM CHLORIDE 0.9 % IV SOLN: 250.0000 mL | INTRAVENOUS | Status: DC | PRN

## 2022-07-07 MED ORDER — SODIUM CHLORIDE 0.9 % WEIGHT BASED INFUSION: 1.0000 mL/kg/h | INTRAVENOUS | Status: DC

## 2022-07-07 MED ORDER — HEPARIN SODIUM (PORCINE) 1000 UNIT/ML IJ SOLN
INTRAMUSCULAR | Status: AC
  Filled 2022-07-07: qty 10

## 2022-07-07 MED ORDER — HEPARIN (PORCINE) IN NACL 1000-0.9 UT/500ML-% IV SOLN
INTRAVENOUS | Status: DC | PRN
  Administered 2022-07-07: 500 mL

## 2022-07-07 SURGICAL SUPPLY — 22 items
BAG SNAP BAND KOVER 36X36 (MISCELLANEOUS) ×2 IMPLANT
BALLN STERLING OTW 3X40X150 (BALLOONS) ×2
BALLOON STERLING OTW 3X40X150 (BALLOONS) IMPLANT
CATH ANGIO 5F BER2 65CM (CATHETERS) IMPLANT
CATH CXI 2.6F 65 ST (CATHETERS) ×2
CATH SPRT STRG 65X2.6FR ACPT (CATHETERS) IMPLANT
COVER DOME SNAP 22 D (MISCELLANEOUS) ×2 IMPLANT
DCB RANGER 4.0X40 135 (BALLOONS) IMPLANT
DCB RANGER 4.0X60 135 (BALLOONS) IMPLANT
DEVICE RAD COMP TR BAND LRG (VASCULAR PRODUCTS) IMPLANT
KIT ENCORE 26 ADVANTAGE (KITS) IMPLANT
KIT MICROPUNCTURE NIT STIFF (SHEATH) IMPLANT
PROTECTION STATION PRESSURIZED (MISCELLANEOUS) ×2
RANGER DCB 4.0X40 135 (BALLOONS) ×2
RANGER DCB 4.0X60 135 (BALLOONS) ×4
SHEATH GLIDE SLENDER 4/5FR (SHEATH) IMPLANT
SHEATH PROBE COVER 6X72 (BAG) ×2 IMPLANT
STATION PROTECTION PRESSURIZED (MISCELLANEOUS) ×2 IMPLANT
STOPCOCK MORSE 400PSI 3WAY (MISCELLANEOUS) ×2 IMPLANT
TRAY PV CATH (CUSTOM PROCEDURE TRAY) ×2 IMPLANT
TUBING CIL FLEX 10 FLL-RA (TUBING) ×2 IMPLANT
WIRE SHEPHERD 6G .018 (WIRE) IMPLANT

## 2022-07-07 NOTE — Discharge Instructions (Signed)

## 2022-07-07 NOTE — Op Note (Signed)
    [name] MRN: [id number] DOB: June 16, 1940 Sex: female  07/07/2022 Pre-operative Diagnosis: Right arm fistula malfunction Post-operative diagnosis:  Same Surgeon:  Broadus John, MD Procedure Performed: 1.  Ultrasound-guided micropuncture access of the right radial artery in retrograde fashion 2.  Right arm fistulogram 3.  Right cephalic vein drug-coated balloon angioplasty 4 x 40,4 x 40, 4 x 60 mm 4.  Access managed with TR band 5.  Moderate sedation time 48 minutes   Indications:  Patient is an 82 year old female who presents today for her right arm fistulogram.  At her last visit in our office in January, low flows were appreciated with poor maturation.  The plan has been for fistulogram, however the patient missed her OR appointment, necessitating rescheduling. On exam, the fistula is lightly pulsatile immediately distal to the anastomosis.  The remainder of the fistula appears decompressed, with no thrill. I had a long conversation regarding fistulogram in an effort to define and improve flow through the fistula so that it can be used for long-term HD access.  There is a high possibility the fistula will not mature at this point, even with balloon assisted maturation. After discussing the risks and benefits [name] elected to proceed.   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  Non- Flow-limiting outflow stenosis appreciated at the proximal portion of the cephalic vein. There is a large collateral from the cephalic vein to the internal jugular which drains quickly.    Procedure:  The patient was identified in the holding area and taken to the cardiac Cath Lab.  She was prepped and draped in standard fashion and a timeout was performed.  Monitored anesthesia was induced with Versed, I was present for the entirety  moderate sedation.  Ultrasound guided micropuncture needle was used to access the right radial artery.  A 0.014 wire was run into the fistula.  Fistulogram followed.  See results above.  I elected to attempt intervention on the anastomotic stenosis, and stenosis appreciated more centrally at the 5 cm mark.  Initially, a 3 x 60 mm balloon was brought onto the field and inflated across both lesions.  This very minor improvement in the anastomotic stenosis but improved the more central stenosis.  I then elected to move to a 4 mm drug-coated balloon for both lesions.  The 4 mm balloon was inflated for 3 minutes, followed by another 4 mm drug-coated balloon the more central lesion.  Another 4 mm balloon was used in the proximal due to greater than 50% stenosis appreciated completion demonstrating excellent result with mild amount of vasospasm.  There was significant improvement in the lumen at the anastomosis.  Residual stenosis less than 10%.  Residual stenosis more centrally in the cephalic vein with less than 10%.   Impression: Successful balloon angioplasty of the right-sided brachiocephalic fistula beginning at the anastomosis and removing centrally to the midhumerus.  Overall, the fistula remains small.  I plan to see her back in 1 month's time to assess for improvement.  Should balloon assisted maturation fail, we discuss abandoning the fistula and creating new access.   [name], MD [facility] Office: [phone]

## 2022-07-07 NOTE — H&P (Signed)
   Note  S/p: Right arm brachiocephalic fistula creation  Subjective: Patient is an 82 year old female who presents for right arm fistulogram due to poor maturation of right-sided brachiocephalic fistula.  She was last seen in the office in January, noted to have low flows.    On exam today, [name] was doing well with no complaints.  Objective: Vitals:   07/07/22 0707  BP: 120/69  Pulse: 60  Resp: 18  Temp: 98 F (36.7 C)  SpO2: 99%    Physical Examination Regular rate, nonlabored breathing Frail Palpable right radial pulse, pulsatility in the fistula at the proximal aspect, no thrill appreciated   ASSESSMENT/PLAN:  Patient is an 82 year old female who presents today for her right arm fistulogram.  At her last visit in our office in January, low flows were appreciated with poor maturation.  The plan has been for fistulogram, however the patient missed her OR appointment, necessitating rescheduling.  On exam, the fistula is lightly pulsatile immediately distal to the anastomosis.  The remainder of the fistula appears decompressed, with no thrill. I had a long conversation regarding fistulogram in an effort to define and improve flow through the fistula so that it can be used for long-term HD access.  There is a high possibility the fistula will not mature at this point, even with balloon assisted maturation. After discussing the risks and benefits [name] elected to proceed.    [name] MD MS [facility] [phone] 07/07/2022  8:30 AM

## 2022-07-09 ENCOUNTER — Telehealth: Payer: Self-pay | Admitting: Vascular Surgery

## 2022-07-09 ENCOUNTER — Encounter (HOSPITAL_BASED_OUTPATIENT_CLINIC_OR_DEPARTMENT_OTHER): Payer: Medicare HMO | Attending: General Surgery | Admitting: General Surgery

## 2022-07-09 ENCOUNTER — Encounter (HOSPITAL_COMMUNITY): Payer: Self-pay | Admitting: Vascular Surgery

## 2022-07-09 DIAGNOSIS — I872 Venous insufficiency (chronic) (peripheral): Secondary | ICD-10-CM | POA: Insufficient documentation

## 2022-07-09 DIAGNOSIS — E11621 Type 2 diabetes mellitus with foot ulcer: Secondary | ICD-10-CM | POA: Insufficient documentation

## 2022-07-09 DIAGNOSIS — Z6821 Body mass index (BMI) 21.0-21.9, adult: Secondary | ICD-10-CM | POA: Diagnosis not present

## 2022-07-09 DIAGNOSIS — E440 Moderate protein-calorie malnutrition: Secondary | ICD-10-CM | POA: Diagnosis not present

## 2022-07-09 DIAGNOSIS — E1165 Type 2 diabetes mellitus with hyperglycemia: Secondary | ICD-10-CM | POA: Diagnosis not present

## 2022-07-09 DIAGNOSIS — Z992 Dependence on renal dialysis: Secondary | ICD-10-CM | POA: Diagnosis not present

## 2022-07-09 DIAGNOSIS — E1142 Type 2 diabetes mellitus with diabetic polyneuropathy: Secondary | ICD-10-CM | POA: Diagnosis not present

## 2022-07-09 DIAGNOSIS — E1122 Type 2 diabetes mellitus with diabetic chronic kidney disease: Secondary | ICD-10-CM | POA: Insufficient documentation

## 2022-07-09 DIAGNOSIS — N186 End stage renal disease: Secondary | ICD-10-CM | POA: Diagnosis not present

## 2022-07-09 DIAGNOSIS — L97812 Non-pressure chronic ulcer of other part of right lower leg with fat layer exposed: Secondary | ICD-10-CM | POA: Insufficient documentation

## 2022-07-09 DIAGNOSIS — I509 Heart failure, unspecified: Secondary | ICD-10-CM | POA: Insufficient documentation

## 2022-07-09 DIAGNOSIS — I132 Hypertensive heart and chronic kidney disease with heart failure and with stage 5 chronic kidney disease, or end stage renal disease: Secondary | ICD-10-CM | POA: Insufficient documentation

## 2022-07-09 NOTE — Telephone Encounter (Signed)
-----   Message from Broadus John, MD sent at 07/07/2022  9:57 AM EST ----- Regarding: Angio Case     [name] MRN: [id number] DOB: 1940-05-08 Sex: female  07/07/2022 Pre-operative Diagnosis: Right arm fistula malfunction Post-operative diagnosis:  Same Surgeon:  Broadus John, MD Procedure Performed: 1.  Ultrasound-guided micropuncture access of the right radial artery in retrograde fashion 2.  Right arm fistulogram 3.  Right cephalic vein drug-coated balloon angioplasty 4 x 40,4 x 40, 4 x 60 mm 4.  Access managed with TR band 5.  Moderate sedation time 48 minutes   Indications:  Patient is an 82 year old female who presents today for her right arm fistulogram. At her last visit in our office in January, low flows were appreciated with poor maturation. The plan has been for fistulogram, however the patient missed her OR appointment, necessitating rescheduling. On exam, the fistula is lightly pulsatile immediately distal to the anastomosis. The remainder of the fistula appears decompressed, with no thrill. I had a long conversation regarding fistulogram in an effort to define and improve flow through the fistula so that it can be used for long-term HD access. There is a high possibility the fistula will not mature at this point, even with balloon assisted maturation. After discussing the risks and benefitsJoann elected to proceed.   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  Non- Flow-limiting outflow stenosis appreciated at the proximal portion of the cephalic vein. There is a large collateral from the cephalic vein to the internal jugular which drains quickly.    Procedure:  The patient was identified in the holding area and taken to the cardiac Cath Lab.  She was prepped and draped in standard fashion and a  timeout was performed.  Monitored anesthesia was induced with Versed, I was present for the entirety moderate sedation.  Ultrasound guided micropuncture needle was used to access the right radial artery.  A 0.014 wire was run into the fistula.  Fistulogram followed.  See results above.  I elected to attempt intervention on the anastomotic stenosis, and stenosis appreciated more centrally at the 5 cm mark.  Initially, a 3 x 60 mm balloon was brought onto the field and inflated across both lesions.  This very minor improvement in the anastomotic stenosis but improved the more central stenosis.  I then elected to move to a 4 mm drug-coated balloon for both lesions.  The 4 mm balloon was inflated for 3 minutes, followed by another 4 mm drug-coated balloon the more central lesion.  Another 4 mm balloon was used in the proximal due to greater than 50% stenosis appreciated completion demonstrating excellent result with mild amount of vasospasm.  There was significant improvement in the lumen at the anastomosis.  Residual stenosis less than 10%.  Residual stenosis more centrally in the cephalic vein with less than 10%.   Impression: Successful balloon angioplasty of the right-sided brachiocephalic fistula beginning at the anastomosis and removing centrally to the midhumerus.    Please set up with fistula ultrasound in one month and appointment with Fortunato Curling.    [name], MD [facility] [place] Office: [phone]

## 2022-07-11 NOTE — Progress Notes (Signed)
[name], [name] ([id number]) [id number] Page 1 of 6 Visit Report for 07/09/2022 Arrival Information Details Patient Name: Date of Service: [name], [name] 07/09/2022 12:45 PM Medical Record Number: [id number] Patient Account Number: [id number] Date of Birth/Sex: Treating RN: 19-Sep-1940 (82 y.o. Valarie Cones, Mechele Claude Primary Care Kathalina Ostermann: [name] Number Other Clinician: Referring Pecola Haxton: Treating Derrek Puff/Extender: [name] in Treatment: 9 Visit Information History Since Last Visit Added or deleted any medications: No Patient Arrived: Wheel Chair Any new allergies or adverse reactions: No Arrival Time: 12:44 Had a fall or experienced change in No Accompanied By: daughter activities of daily living that may affect Transfer Assistance: Manual risk of falls: Patient Identification Verified: Yes Signs or symptoms of abuse/neglect since last visito No Patient Requires Transmission-Based Precautions: No Hospitalized since last visit: No Patient Has Alerts: No Implantable device outside of the clinic excluding No cellular tissue based products placed in the center since last visit: Has Dressing in Place as Prescribed: Yes Has Compression in Place as Prescribed: Yes Pain Present Now: No Electronic Signature(s) Signed: 07/09/2022 4:58:35 PM By: [name] RN Entered By: [name] on 07/09/2022 13:00:18 -------------------------------------------------------------------------------- Encounter Discharge Information Details Patient Name: Date of Service: [name], [name] 07/09/2022 12:45 PM Medical Record Number: [id number] Patient Account Number: [id number] Date of Birth/Sex: Treating RN: Nov 14, 1940 (82 y.o. [name] Primary Care Samiah Ricklefs: [name] Number Other Clinician: Referring Kolbie Lepkowski: Treating Mannix Kroeker/Extender: [name] in Treatment: 9 Encounter Discharge Information Items  Post Procedure Vitals Discharge Condition: Stable Temperature (F): 97.7 Ambulatory Status: Wheelchair Pulse (bpm): 72 Discharge Destination: Home Respiratory Rate (breaths/min): 16 Transportation: Private Auto Blood Pressure (mmHg): 131/61 Accompanied By: Daughter Schedule Follow-up Appointment: Yes Clinical Summary of Care: Electronic Signature(s) Signed: 07/09/2022 4:58:35 PM By: [name] RN Entered By: [name] on 07/09/2022 16:56:51 -------------------------------------------------------------------------------- Lower Extremity Assessment Details Patient Name: Date of Service: [name], [name] 07/09/2022 12:45 PM Medical Record Number: [id number] Patient Account Number: [id number] Date of Birth/Sex: Treating RN: [birth year] (82 y.o. [name] Primary Care Tyree Fluharty: [name] Number Other Clinician: Referring Emilia Kayes: Treating Kamelia Lampkins/Extender: [name] in Treatment: 9 Edema Assessment S[Left: Bethena Roys [phone] Patrice [id number].pdf Page 2 of 6] Assessed: [Left: No] [Right: No] Edema: [Left: No] [Right: Yes] Calf Left: Right: Point of Measurement: 33 cm From Medial Instep 25 cm 29 cm Ankle Left: Right: Point of Measurement: 9 cm From Medial Instep 18.5 cm 22 cm Vascular Assessment Pulses: Dorsalis Pedis Palpable: [Left:Yes] [Right:Yes] Electronic Signature(s) Signed: 07/09/2022 4:58:35 PM By: [name] RN Entered By: [name] on 07/09/2022 16:54:54 -------------------------------------------------------------------------------- Multi Wound Chart Details Patient Name: Date of Service: [name], [name] 07/09/2022 12:45 PM Medical Record Number: [id number] Patient Account Number: [id number] Date of Birth/Sex: Treating RN: 11-27-1940 (82 y.o. 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 Secondary Etiology: Extremity Extremity Cataracts, Arrhythmia, Congestive Cataracts, Arrhythmia, Congestive N/A Comorbid History: Heart Failure, Hypertension, Type II Heart Failure, Hypertension, Type II Diabetes, End Stage Renal Disease, Diabetes, End Stage Renal Disease, Neuropathy Neuropathy 06/30/2022 06/30/2022 N/A Date Acquired: 1 1 N/A Weeks of Treatment: Healed - Epithelialized Healed - Epithelialized N/A Wound Status: No No N/A Wound Recurrence: 0x0x0 0x0x0 N/A Measurements L  x W x D (cm) 0 0 N/A A (cm) : rea 0 0 N/A Volume (cm) : 100.00% 100.00% N/A % Reduction in A rea: 100.00% 100.00% N/A % Reduction in Volume: Full Thickness Without Exposed Full Thickness Without Exposed N/A Classification: Support Structures Support Structures None Present None Present N/A Exudate A mount: Flat and Intact Flat and Intact N/A Wound Margin: None Present (0%) None Present (0%) N/A Granulation A mount: None Present (0%) None Present (0%) N/A Necrotic A mount: Fascia: No Fascia: No N/A Exposed Structures: Fat Layer (Subcutaneous Tissue): No Fat Layer (Subcutaneous Tissue): No Tendon: No Tendon: No Muscle: No Muscle: No Joint: No Joint: No Bone: No Bone: No Small (1-33%) Large (67-100%) N/A Epithelialization: Debridement - Selective/Open Wound Debridement - Selective/Open Wound N/A Debridement: Pre-procedure Verification/Time Out 12:55 12:55 N/A RYLANN, TOLLY ([id number]) [id number] Page 3 of 6 Taken: Lidocaine 4% Topical Solution Lidocaine 4% Topical Solution N/A Pain Control: Necrotic/Eschar Necrotic/Eschar N/A Tissue Debrided: Non-Viable  Tissue Non-Viable Tissue N/A Level: 0.01 0.01 N/A Debridement A (sq cm): rea Curette Curette N/A Instrument: Minimum Minimum N/A Bleeding: Pressure Pressure N/A Hemostasis A chieved: 0 0 N/A Procedural Pain: 0 0 N/A Post Procedural Pain: Procedure was tolerated well Procedure was tolerated well N/A Debridement Treatment Response: 0.1x0.1x0.1 0.1x0.1x0.1 N/A Post Debridement Measurements L x W x D (cm) 0.001 0.001 N/A Post Debridement Volume: (cm) No Abnormalities Noted No Abnormalities Noted N/A Periwound Skin Texture: No Abnormalities Noted No Abnormalities Noted N/A Periwound Skin Moisture: No Abnormalities Noted No Abnormalities Noted N/A Periwound Skin Color: No Abnormality No Abnormality N/A Temperature: Debridement Debridement N/A Procedures Performed: Treatment Notes Electronic Signature(s) Signed: 07/09/2022 1:16:20 PM By: [name] MD FACS Entered By: [name] on 07/09/2022 13:16:20 -------------------------------------------------------------------------------- Multi-Disciplinary Care Plan Details Patient Name: Date of Service: [name], [name] 07/09/2022 12:45 PM Medical Record Number: [id number] Patient Account Number: [id number] Date of Birth/Sex: Treating RN: [birth year] (82 y.o. [name] Primary Care Maxximus Gotay: [name] Number Other Clinician: Referring Hila Bolding: Treating Trai Ells/Extender: [name] in Treatment: [place] reviewed with physician Active Inactive Electronic Signature(s) Signed: 07/09/2022 4:58:35 PM By: [name] RN Entered By: [name] on 07/09/2022 16:55:42 -------------------------------------------------------------------------------- Pain Assessment Details Patient Name: Date of Service: [name], [name] 07/09/2022 12:45 PM Medical Record Number: [id number] Patient Account Number: [id number] Date of Birth/Sex: Treating RN: 03-13-1941 (82 y.o. [name] Primary Care Balthazar Dooly: [name] Number Other Clinician: Referring Aaniya Sterba: Treating Dontravious Camille/Extender: [name] in Treatment: 9 Active Problems Location of Pain Severity and Description of Pain Patient Has Paino No Site Locations [place], [place] ([id number]) [id number] Page 4 of 6 Pain Management and Medication Current Pain Management: Electronic Signature(s) Signed: 07/09/2022 4:58:35 PM By: [name] RN Entered By: [name] on 07/09/2022 16:54:46 -------------------------------------------------------------------------------- Patient/Caregiver Education Details Patient Name: Date of Service: Osvaldo Angst NN 3/8/2024andnbsp12:45 PM Medical Record Number: [id number] Patient Account Number: [id number] Date of Birth/Gender: Treating RN: November 12, 1940 (83 y.o. [name] Primary Care Physician: [name] Number Other Clinician: Referring Physician: Treating Physician/Extender: [name] in Treatment: 9 Education Assessment Education Provided To: Patient Education Topics Provided Wound/Skin Impairment: Methods: Explain/Verbal Responses: Return demonstration correctly Electronic Signature(s) Signed: 07/09/2022 4:58:35 PM By: [name] RN Entered By: [name] on 07/09/2022 16:55:56 -------------------------------------------------------------------------------- Wound Assessment Details Patient Name: Date of Service: [name], [name] 07/09/2022 12:45 PM Medical Record Number: [id number] Patient Account Number: [id number] Date of Birth/Sex: Treating RN: 1940-05-08 (82 y.o. F) [place],  Mechele Claude Primary Care Nyheim Seufert: [name] Number Other Clinician: Referring Deveron Shamoon: Treating Jeromiah Ohalloran/Extender: [name] in Treatment: 9 Wound Status Wound Number: 7 Primary Abrasion Etiology: Wound Location: Right, Proximal, Anterior  Lower Leg Secondary Diabetic Wound/Ulcer of the Lower Extremity Wounding Event: Blister Etiology: Date Acquired: 06/30/2022 Wound Healed - Epithelialized Weeks Of Treatment: 1 Status: Clustered Wound: No [name] ([id number]) [id number] Page 5 of 6 Clustered Wound: No Comorbid Cataracts, Arrhythmia, Congestive Heart Failure, Hypertension, History: Type II Diabetes, End Stage Renal Disease, Neuropathy Photos Wound Measurements Length: (cm) Width: (cm) Depth: (cm) Area: (cm) Volume: (cm) 0 % Reduction in Area: 100% 0 % Reduction in Volume: 100% 0 Epithelialization: Small (1-33%) 0 Tunneling: No 0 Undermining: No Wound Description Classification: Full Thickness Without Exposed Support Structures Wound Margin: Flat and Intact Exudate Amount: None Present Foul Odor After Cleansing: No Slough/Fibrino No Wound Bed Granulation Amount: None Present (0%) Exposed Structure Necrotic Amount: None Present (0%) Fascia Exposed: No Fat Layer (Subcutaneous Tissue) Exposed: No Tendon Exposed: No Muscle Exposed: No Joint Exposed: No Bone Exposed: No Periwound Skin Texture Texture Color No Abnormalities Noted: Yes No Abnormalities Noted: Yes Moisture Temperature / Pain No Abnormalities Noted: Yes Temperature: No Abnormality Electronic Signature(s) Signed: 07/09/2022 4:58:35 PM By: [name] RN Entered By: [name] on 07/09/2022 13:04:22 -------------------------------------------------------------------------------- Wound Assessment Details Patient Name: Date of Service: [name], [name] 07/09/2022 12:45 PM Medical Record Number: [id number] Patient Account Number: [id number] Date of Birth/Sex: Treating RN: February 28, 1941 (82 y.o. 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 Event: Trauma Etiology: Date Acquired: 06/30/2022 Wound Healed - Epithelialized Weeks Of Treatment: 1 Status: Clustered Wound: No Comorbid Cataracts, Arrhythmia, Congestive Heart Failure, Hypertension, History: Type II Diabetes, End Stage Renal Disease, Neuropathy Photos TIARRA, KIOUS ([id number]) [id number] Page 6 of 6 Wound Measurements Length: (cm) Width: (cm) Depth: (cm) Area: (cm) Volume: (cm) 0 % Reduction in Area: 100% 0 % Reduction in Volume: 100% 0 Epithelialization: Large (67-100%) 0 Tunneling: No 0 Undermining: No Wound Description Classification: Full Thickness Without Exposed Support Structures Wound Margin: Flat and Intact Exudate Amount: None Present Foul Odor After Cleansing: No Slough/Fibrino No Wound Bed Granulation Amount: None Present (0%) Exposed Structure Necrotic Amount: None Present (0%) Fascia Exposed: No Fat Layer (Subcutaneous Tissue) Exposed: No Tendon Exposed: No Muscle Exposed: No Joint Exposed: No Bone Exposed: No Periwound Skin Texture Texture Color No Abnormalities Noted: Yes No Abnormalities Noted: Yes Moisture Temperature / Pain No Abnormalities Noted: Yes Temperature: No Abnormality Electronic Signature(s) Signed: 07/09/2022 4:58:35 PM By: [name] RN Entered By: [name] on 07/09/2022 13:04:52 -------------------------------------------------------------------------------- [place] Details Patient Name: Date of Service: [name], [name] 07/09/2022 12:45 PM Medical Record Number: [id number] Patient Account Number: [id number] Date of Birth/Sex: Treating RN: Mar 02, 1941 (82 y.o. [name] Primary Care Mckynzie Liwanag: [name] Number Other Clinician: Referring Evart Mcdonnell: Treating Jo-Ann Johanning/Extender: [name] in  Treatment: 9 Vital Signs Time Taken: 12:44 Temperature (F): 97.7 Height (in): 63 Pulse (bpm): 72 Weight (lbs): 123 Respiratory Rate (breaths/min): 16 Body Mass Index (BMI): 21.8 Blood Pressure (mmHg): 131/61 Reference Range: 80 - 120 mg / dl Electronic Signature(s) Signed: 07/09/2022 4:58:35 PM By: [name] RN Entered By: [name] on 07/09/2022 16:54:40

## 2022-07-11 NOTE — Progress Notes (Addendum)
[name], [name] ([id number]) [id number] Page 1 of 9 Visit Report for 07/09/2022 Chief Complaint Document Details Patient Name: Date of Service: [name], [name] 07/09/2022 12:45 PM Medical Record Number: [id number] Patient Account Number: [id number] Date of Birth/Sex: Treating RN: 09-29-1940 (82 y.o. F) Primary Care Provider: [name] Number Other Clinician: Referring Provider: Treating Provider/Extender: [name] in Treatment: 9 Information Obtained from: Patient Chief Complaint Patient presents for treatment of open ulcers due to venous insufficiency Electronic Signature(s) Signed: 07/09/2022 1:16:27 PM By: [name] MD FACS Entered By: [name] on 07/09/2022 13:16:26 -------------------------------------------------------------------------------- Debridement Details Patient Name: Date of Service: [name], [name] 07/09/2022 12:45 PM Medical Record Number: [id number] Patient Account Number: [id number] Date of Birth/Sex: Treating RN: [birth year] (82 y.o. [name] Primary Care Provider: [name] Number Other Clinician: Referring Provider: Treating Provider/Extender: [name] in Treatment: 9 Debridement Performed for Assessment: Wound #8 Right,Distal,Anterior Lower Leg Performed By: Physician [name], MD Debridement Type: Debridement Severity of Tissue Pre Debridement: Fat layer exposed Level of Consciousness (Pre-procedure): Awake and Alert Pre-procedure Verification/Time Out Yes - 12:55 Taken: Start Time: 12:55 Pain Control: Lidocaine 4% T opical Solution T Area Debrided (L x W): otal 0.1 (cm) x 0.1 (cm) = 0.01 (cm) Tissue and other material debrided: Non-Viable, Eschar Level: Non-Viable Tissue Debridement Description: Selective/Open Wound Instrument: Curette Bleeding: Minimum Hemostasis Achieved: Pressure End Time: 12:57 Procedural Pain: 0 Post  Procedural Pain: 0 Response to Treatment: Procedure was tolerated well Level of Consciousness (Post- Awake and Alert procedure): Post Debridement Measurements of Total Wound Length: (cm) 0.1 Width: (cm) 0.1 Depth: (cm) 0.1 Volume: (cm) 0.001 Character of Wound/Ulcer Post Debridement: Improved Severity of Tissue Post Debridement: Fat layer exposed Post Procedure Diagnosis Same as Pre-procedure Notes Scribed for Dr. [name] by [name] Electronic Signature(s) [place], [name] ([id number]) [id number] Page 2 of 9 Signed: 07/09/2022 2:25:58 PM By: [name] MD FACS Signed: 07/09/2022 4:58:35 PM By: [name] RN Entered By: [name] on 07/09/2022 13:01:59 -------------------------------------------------------------------------------- Debridement Details Patient Name: Date of Service: [name], [name] 07/09/2022 12:45 PM Medical Record Number: [id number] Patient Account Number: [id number] Date of Birth/Sex: Treating RN: [birth year] (82 y.o. [name] Primary Care Provider: [name] Number Other Clinician: Referring Provider: Treating Provider/Extender: [name] in Treatment: 9 Debridement Performed for Assessment: Wound #7 Right,Proximal,Anterior Lower Leg Performed By: Physician [name], MD Debridement Type: Debridement Severity of Tissue Pre Debridement: Fat layer exposed Level of Consciousness (Pre-procedure): Awake and Alert Pre-procedure Verification/Time Out Yes - 12:55 Taken: Start Time: 12:55 Pain Control: Lidocaine 4% T opical Solution T Area Debrided (L x W): otal 0.1 (cm) x 0.1 (cm) = 0.01 (cm) Tissue and other material debrided: Non-Viable, Eschar Level: Non-Viable Tissue Debridement Description: Selective/Open Wound Instrument: Curette Bleeding: Minimum Hemostasis Achieved: Pressure End Time: 12:57 Procedural Pain: 0 Post Procedural Pain: 0 Response to Treatment:  Procedure was tolerated well Level of Consciousness (Post- Awake and Alert procedure): Post Debridement Measurements of Total Wound Length: (cm) 0.1 Width: (cm) 0.1 Depth: (cm) 0.1 Volume: (cm) 0.001 Character of Wound/Ulcer Post Debridement: Improved Severity of Tissue Post Debridement: Fat layer exposed Post Procedure Diagnosis Same as Pre-procedure Notes Scribed for Dr. [name] by [name] Electronic Signature(s) Signed: 07/09/2022 2:25:58 PM By: [name] MD FACS Signed: 07/09/2022 4:58:35 PM By: [name] RN Entered By: [name] on 07/09/2022 13:03:13 -------------------------------------------------------------------------------- HPI Details Patient Name: Date of Service: [name], [name] 07/09/2022 12:45 PM Medical Record Number: [id number] Patient Account  Number: [id number] Date of Birth/Sex: Treating RN: [birth year] (82 y.o. F) Primary Care Provider: [name] Number Other Clinician: Referring Provider: Treating Provider/Extender: [name] in Treatment: 9 History of Present Illness HPI Description: ADMISSION 12/25/2021 This is an 82 year old woman with multiple medical comorbidities including congestive heart failure, hypertension, end-stage renal disease on hemodialysis, [name] ([id number]) [id number] Page 3 of 9 polyneuropathy, poorly controlled type 2 diabetes mellitus (last hemoglobin A1c available to me is from December 2022 and was 9.6). She presents to clinic today with multiple open ulcers on her bilateral lower extremities in the setting of severe edema. She says that she has compression stockings but has not worn them in some time because they are too tight. Her wounds are secondary to trauma from her wheelchair. She often sleeps in her wheelchair with her legs hanging down. ABIs in clinic today were noncompressible on the right and 0.57 on the left. She has multiple shallow ulcers  on the bilateral lower extremities. She has 2+ pitting edema and her legs are extremely tense. They are somewhat erythematous and warm. She is currently taking cephalexin prescribed by her primary care provider due to concern for cellulitis. 01/01/2022: All of her wounds are healed. Edema control with the Kerlix and Coban wraps is excellent. She does have compression stockings at home. READMISSION 05/07/2022 The patient returns with a new wound on her right lower anterior leg. She is accompanied by her daughter. They admit that she has not been wearing her compression stockings at home. The patient's daughter reports that this wound started as a blister that ruptured. She has been applying peroxide. Both indicate that the patient is still frequently sleeping in her wheelchair with her legs dependent. ABI in clinic today was 1.08. The wound is small, just medial to the anterior tibial surface, clean. 06/07/2022: The patient has failed to show up for multiple clinic visits. The patient's daughter says that she simply refuses to come when its time for her appointments. She continues to be quite stubborn about sleeping in her wheelchair with her legs dependent. The wound on her right anterior tibial surface is shallow and just has a bit of eschar buildup. 06/14/2022: She has a new wound on her left anterior tibial surface. It has some slough accumulation. The wound on her right leg has slough and eschar buildup. Edema control is much better this week. 06/21/2022: The wound on her right leg is healed. The wound on her left anterior tibial surface is superficial with just a layer of eschar on the surface. Edema control is very good bilaterally. 06/30/2022: Her left leg has healed. She managed to scrape her anterior tibial surface on the right and she has open abrasions extending into the fat layer. 07/09/2022: The right anterior tibial surface wounds have eschar and adherent silver alginate present. No concern for  infection. Electronic Signature(s) Signed: 07/09/2022 1:21:57 PM By: [name] MD FACS Entered By: [name] on 07/09/2022 13:21:56 -------------------------------------------------------------------------------- Physical Exam Details Patient Name: Date of Service: [name], [name] 07/09/2022 12:45 PM Medical Record Number: [id number] Patient Account Number: [id number] Date of Birth/Sex: Treating RN: 11/08/1940 (82 y.o. F) Primary Care Provider: [name] Number Other Clinician: Referring Provider: Treating Provider/Extender: [name] in Treatment: 9 Constitutional no acute distress. Respiratory Normal work of breathing on room air. Notes 07/09/2022: The right anterior tibial surface wounds have eschar and adherent silver alginate present. No concern for infection. Electronic Signature(s) Signed: 07/09/2022 1:24:31 PM By: [name] MD  FACS Entered By: [name] on 07/09/2022 13:24:31 -------------------------------------------------------------------------------- Physician Orders Details Patient Name: Date of Service: [name], [name] 07/09/2022 12:45 PM Medical Record Number: [id number] Patient Account Number: [id number] Date of Birth/Sex: Treating RN: 08-13-1940 (82 y.o. 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 Electronic Signature(s) Signed: 07/09/2022 1:26:23 PM By: [name] MD FACS Entered By: [name] on 07/09/2022 13:26:23 -------------------------------------------------------------------------------- Problem List Details Patient Name: Date of Service: [name], [name] 07/09/2022 12:45 PM Medical Record Number: [id number] Patient Account Number: [id number] Date of Birth/Sex: Treating RN: [birth year] (82 y.o. F) Primary Care Provider: [name] Number Other Clinician: Referring Provider: Treating Provider/Extender: [name] in Treatment: 9 Active Problems ICD-10 Encounter Code Description Active Date MDM Diagnosis L97.812 Non-pressure chronic ulcer of other part of right lower leg with fat layer 06/30/2022 No Yes exposed I87.2 Venous insufficiency (chronic) (peripheral) 05/07/2022 No Yes I50.9 Heart failure, unspecified 05/07/2022 No Yes N18.6 End stage renal disease 05/07/2022 No Yes E44.0 Moderate protein-calorie malnutrition 05/07/2022 No Yes E11.42 Type 2 diabetes mellitus with diabetic polyneuropathy 05/07/2022 No Yes Inactive Problems ICD-10 Code Description Active Date Inactive Date L97.822 Non-pressure chronic ulcer of other part of left lower leg with fat layer exposed 06/14/2022 06/14/2022 Resolved Problems Electronic Signature(s) Signed: 07/09/2022 1:16:12 PM By: [name] MD FACS Entered By: [name] on 07/09/2022 13:16:12 [name] ([id number] Page 5 of 9 -------------------------------------------------------------------------------- Progress Note Details Patient Name: Date of Service: [name], [name] 07/09/2022 12:45 PM Medical Record Number: [id number] Patient Account Number: [id number] Date of Birth/Sex: Treating RN: Sep 15, 1940 (82 y.o. F) Primary Care Provider: [name] Number Other  Clinician: Referring Provider: Treating Provider/Extender: [name] in Treatment: 9 Subjective Chief Complaint Information obtained from Patient Patient presents for treatment of open ulcers due to venous insufficiency History of Present Illness (HPI) ADMISSION 12/25/2021 This is an 82 year old woman with multiple medical comorbidities including congestive heart failure, hypertension, end-stage renal disease on hemodialysis, polyneuropathy, poorly controlled type 2 diabetes mellitus (last hemoglobin A1c available to me is from December 2022 and was 9.6). She presents to clinic today with multiple open ulcers on her bilateral lower extremities in the setting of severe edema. She says that she has compression stockings but has not worn them in some time because they are too tight. Her wounds are secondary to trauma from her wheelchair. She often sleeps in her wheelchair with her legs hanging down. ABIs in clinic today were noncompressible on the right and 0.57 on the left. She has multiple shallow ulcers on the bilateral lower extremities. She has 2+ pitting edema and her legs are extremely tense. They are somewhat erythematous and warm. She is currently taking cephalexin prescribed by her primary care provider due to concern for cellulitis. 01/01/2022: All of her wounds are healed. Edema control with the Kerlix and Coban wraps is excellent. She does have compression stockings at home. READMISSION 05/07/2022 The patient returns with a new wound on her  right lower anterior leg. She is accompanied by her daughter. They admit that she has not been wearing her compression stockings at home. The patient's daughter reports that this wound started as a blister that ruptured. She has been applying peroxide. Both indicate that the patient is still frequently sleeping in her wheelchair with her legs dependent. ABI in clinic today was 1.08. The wound is small, just medial to the  anterior tibial surface, clean. 06/07/2022: The patient has failed to show up for multiple clinic visits. The patient's daughter says that she simply refuses to come when its time for her appointments. She continues to be quite stubborn about sleeping in her wheelchair with her legs dependent. The wound on her right anterior tibial surface is shallow and just has a bit of eschar buildup. 06/14/2022: She has a new wound on her left anterior tibial surface. It has some slough accumulation. The wound on her right leg has slough and eschar buildup. Edema control is much better this week. 06/21/2022: The wound on her right leg is healed. The wound on her left anterior tibial surface is superficial with just a layer of eschar on the surface. Edema control is very good bilaterally. 06/30/2022: Her left leg has healed. She managed to scrape her anterior tibial surface on the right and she has open abrasions extending into the fat layer. 07/09/2022: The right anterior tibial surface wounds have eschar and adherent silver alginate present. No concern for infection. Patient History Information obtained from Patient, Caregiver, Chart. Family History Heart Disease - Mother,Father, Hypertension - Mother,Father, No family history of Cancer, Diabetes, Hereditary Spherocytosis, Kidney Disease, Lung Disease, Seizures, Stroke, Thyroid Problems, Tuberculosis. Social History Never smoker, Marital Status - Divorced, Alcohol Use - Never, Drug Use - No History, Caffeine Use - Daily. Medical History Eyes Patient has history of Cataracts - bil removed Denies history of Glaucoma, Optic Neuritis Hematologic/Lymphatic Denies history of Anemia Cardiovascular Patient has history of Arrhythmia - afib, Congestive Heart Failure, Hypertension Endocrine Patient has history of Type II Diabetes Denies history of Type I Diabetes Genitourinary Patient has history of End Stage Renal Disease - HemoDialysis Integumentary  (Skin) Denies history of History of Burn Neurologic Patient has history of Neuropathy Oncologic Denies history of Received Chemotherapy, Received Radiation Psychiatric Denies history of Anorexia/bulimia, Confinement Anxiety Hospitalization/Surgery History - AV fistula plaement left arm. - tonsillectomy. Medical A Surgical History Notes nd [name], [name] ([id number]) [id number] Page 6 of 9 Constitutional Symptoms (General Health) vitamin D deficiency Cardiovascular hyperlipidemia Objective Constitutional no acute distress. Vitals Time Taken: 12:44 PM, Height: 63 in, Weight: 123 lbs, BMI: 21.8, Temperature: 97.7 F, Pulse: 72 bpm, Respiratory Rate: 16 breaths/min, Blood Pressure: 131/61 mmHg. Respiratory Normal work of breathing on room air. General Notes: 07/09/2022: The right anterior tibial surface wounds have eschar and adherent silver alginate present. No concern for infection. Integumentary (Hair, Skin) Wound #7 status is Healed - Epithelialized. Original cause of wound was Blister. The date acquired was: 06/30/2022. The wound has been in treatment 1 weeks. The wound is located on the Right,Proximal,Anterior Lower Leg. The wound measures 0cm length x 0cm width x 0cm depth; 0cm^2 area and 0cm^3 volume. There is no tunneling or undermining noted. There is a none present amount of drainage noted. The wound margin is flat and intact. There is no granulation within the wound bed. There is no necrotic tissue within the wound bed. The periwound skin appearance had no abnormalities noted for texture. The periwound skin appearance had no abnormalities noted  for moisture. The periwound skin appearance had no abnormalities noted for color. Periwound temperature was noted as No Abnormality. Wound #8 status is Healed - Epithelialized. Original cause of wound was Trauma. The date acquired was: 06/30/2022. The wound has been in treatment 1 weeks. The wound is located on  the Right,Distal,Anterior Lower Leg. The wound measures 0cm length x 0cm width x 0cm depth; 0cm^2 area and 0cm^3 volume. There is no tunneling or undermining noted. There is a none present amount of drainage noted. The wound margin is flat and intact. There is no granulation within the wound bed. There is no necrotic tissue within the wound bed. The periwound skin appearance had no abnormalities noted for texture. The periwound skin appearance had no abnormalities noted for moisture. The periwound skin appearance had no abnormalities noted for color. Periwound temperature was noted as No Abnormality. 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 There was a Selective/Open Wound Non-Viable Tissue Debridement with a total area of 0.01 sq cm performed by [name], MD. With the following instrument(s): Curette to remove Non-Viable tissue/material. Material removed includes Eschar after achieving pain control using Lidocaine 4% Topical Solution. No specimens were taken. A time out was conducted at 12:55, prior to the start of the procedure. A Minimum amount of bleeding was controlled with Pressure. The procedure was tolerated well with a pain level of 0 throughout and a pain level of 0 following the procedure. Post Debridement Measurements: 0.1cm length x 0.1cm width x 0.1cm depth; 0.001cm^3 volume. Character of Wound/Ulcer Post Debridement is improved. Severity of Tissue Post Debridement is: Fat layer exposed. Post procedure Diagnosis Wound #7: Same as  Pre-Procedure General Notes: Scribed for Dr. [name] by [name]. Wound #8 Pre-procedure diagnosis of Wound #8 is an Abrasion located on the Right,Distal,Anterior Lower Leg .Severity of Tissue Pre Debridement is: Fat layer exposed. There was a Selective/Open Wound Non-Viable Tissue Debridement with a total area of 0.01 sq cm performed by [name], MD. With the following instrument(s): Curette to remove Non-Viable tissue/material. Material removed includes Eschar after achieving pain control using Lidocaine 4% Topical Solution. No specimens were taken. A time out was conducted at 12:55, prior to the start of the procedure. A Minimum amount of bleeding was controlled with Pressure. The procedure was tolerated well with a pain level of 0 throughout and a pain level of 0 following the procedure. Post Debridement Measurements: 0.1cm length x 0.1cm width x 0.1cm depth; 0.001cm^3 volume. Character of Wound/Ulcer Post Debridement is improved. Severity of Tissue Post Debridement is: Fat layer exposed. Post procedure Diagnosis Wound #8: Same as Pre-Procedure General Notes: Scribed for Dr. [name] by [name]. [name], [name] ([id number]) [id number] Page 7 of 9 Plan Discharge From [facility] Services: Discharge from [place] your wound is healed! 07/09/2022: The right anterior tibial surface wounds have eschar and adherent silver alginate present. No concern for infection. I used a curette to debride the eschar and adherent silver alginate from her wounds. This revealed completely epithelialized surfaces underneath. Her wounds are healed. She was reminded of the importance of wearing compression garments on her legs and keeping them elevated is much as possible throughout  the day and at night while she is sleeping. She will be discharged from the wound care center. She may follow-up as needed. Electronic Signature(s) Signed: 07/15/2022 9:20:27 AM By:  Deon Pilling RN, BSN Signed: 07/26/2022 5:13:01 PM By: [name] MD FACS Previous Signature: 07/09/2022 1:27:10 PM Version By: [name] MD FACS Entered By: Deon Pilling on 07/15/2022 09:19:58 -------------------------------------------------------------------------------- HxROS Details Patient Name: Date of Service: [name], [name] 07/09/2022 12:45 PM Medical Record Number: [id number] Patient Account Number: [id number] Date of Birth/Sex: Treating RN: [birth year] (82 y.o. F) Primary Care Provider: [name] Number Other Clinician: Referring Provider: Treating Provider/Extender: [name] in Treatment: 9 Information Obtained From Patient Caregiver Chart Constitutional Symptoms (General Health) Medical History: Past Medical History Notes: vitamin D deficiency Eyes Medical History: Positive for: Cataracts - bil removed Negative for: Glaucoma; Optic Neuritis Hematologic/Lymphatic Medical History: Negative for: Anemia Cardiovascular Medical History: Positive for: Arrhythmia - afib; Congestive Heart Failure; Hypertension Past Medical History Notes: hyperlipidemia Endocrine Medical History: Positive for: Type II Diabetes Negative for: Type I Diabetes Time with diabetes: approx 6 yrs Treated with: Insulin Blood sugar tested every day: Yes Tested : 2 times per day Genitourinary Medical HistoryABBYGAILE, LOUT ([id number]) [id number] Page 8 of 9 Positive for: End Stage Renal Disease - HemoDialysis Integumentary (Skin) Medical History: Negative for: History of Burn Neurologic Medical History: Positive for: Neuropathy Oncologic Medical History: Negative for: Received Chemotherapy; Received Radiation Psychiatric Medical History: Negative for: Anorexia/bulimia; Confinement Anxiety HBO Extended History Items Eyes: Cataracts Immunizations Pneumococcal Vaccine: Received Pneumococcal Vaccination:  No Implantable Devices Yes Hospitalization / Surgery History Type of Hospitalization/Surgery AV fistula plaement left arm tonsillectomy Family and Social History Cancer: No; Diabetes: No; Heart Disease: Yes - Mother,Father; Hereditary Spherocytosis: No; Hypertension: Yes - Mother,Father; Kidney Disease: No; Lung Disease: No; Seizures: No; Stroke: No; Thyroid Problems: No; Tuberculosis: No; Never smoker; Marital Status - Divorced; Alcohol Use: Never; Drug Use: No History; Caffeine Use: Daily; Financial Concerns: No; Food, Clothing or Shelter Needs: No; Support System Lacking: No; Transportation Concerns: No Electronic Signature(s) Signed: 07/09/2022 2:25:58 PM By: [name] MD FACS Entered By: [name] on 07/09/2022 13:24:08 -------------------------------------------------------------------------------- SuperBill Details Patient Name: Date of Service: MALLEY, ESCOBAR NN 07/09/2022 Medical Record Number: [id number] Patient Account Number: [id number] Date of Birth/Sex: Treating RN: 1940/12/18 (82 y.o. 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 Venous insufficiency (chronic) (peripheral) I50.9 Heart failure, unspecified N18.6 End stage renal disease E44.0 Moderate protein-calorie malnutrition E11.42 Type 2 diabetes mellitus with diabetic polyneuropathy RENI, BICKEL ([id number]) [phone].pdf Page 9 of 9 Facility Procedures : CPT4 Code: [id number] Description: [phone] - DEBRIDE WOUND 1ST 20 SQ CM OR < ICD-10 Diagnosis Description [id number] Non-pressure chronic ulcer of other part of right lower leg with fat layer expos Modifier: ed Quantity: 1 Physician Procedures : CPT4 Code Description  Modifier [id number] - WC PHYS LEVEL 3 - EST PT 25 ICD-10 Diagnosis Description L97.812 Non-pressure chronic ulcer of other part of right lower leg with fat layer exposed I87.2 Venous insufficiency (chronic) (peripheral) N18.6  End stage renal disease I50.9 Heart failure, unspecified Quantity: 1 : [id number] - WC PHYS DEBR WO ANESTH 20 SQ CM ICD-10 Diagnosis Description L97.812 Non-pressure chronic ulcer of other part of right lower leg with fat layer exposed Quantity: 1 Electronic Signature(s)  Signed: 07/09/2022 1:27:48 PM By: [name] MD FACS Entered By: [name] on 07/09/2022 13:27:47

## 2022-07-12 MED FILL — Midazolam HCl Inj 5 MG/5ML (Base Equivalent): INTRAMUSCULAR | Qty: 0.5 | Status: AC

## 2022-07-20 NOTE — Telephone Encounter (Signed)
Appt has been scheduled.

## 2022-07-22 ENCOUNTER — Other Ambulatory Visit: Payer: Self-pay

## 2022-07-22 ENCOUNTER — Emergency Department (HOSPITAL_COMMUNITY): Payer: Medicare HMO

## 2022-07-22 ENCOUNTER — Observation Stay (HOSPITAL_COMMUNITY)
Admission: EM | Admit: 2022-07-22 | Discharge: 2022-07-23 | Disposition: A | Discharge status: Home / Self Care | Payer: Medicare HMO | Attending: Internal Medicine | Admitting: Internal Medicine

## 2022-07-22 ENCOUNTER — Encounter (HOSPITAL_COMMUNITY): Payer: Self-pay

## 2022-07-22 DIAGNOSIS — T383X5A Adverse effect of insulin and oral hypoglycemic [antidiabetic] drugs, initial encounter: Secondary | ICD-10-CM | POA: Insufficient documentation

## 2022-07-22 DIAGNOSIS — E162 Hypoglycemia, unspecified: Secondary | ICD-10-CM | POA: Diagnosis present

## 2022-07-22 DIAGNOSIS — I10 Essential (primary) hypertension: Secondary | ICD-10-CM | POA: Diagnosis present

## 2022-07-22 DIAGNOSIS — I5032 Chronic diastolic (congestive) heart failure: Secondary | ICD-10-CM | POA: Insufficient documentation

## 2022-07-22 DIAGNOSIS — N186 End stage renal disease: Secondary | ICD-10-CM | POA: Diagnosis not present

## 2022-07-22 DIAGNOSIS — R4182 Altered mental status, unspecified: Secondary | ICD-10-CM | POA: Diagnosis present

## 2022-07-22 DIAGNOSIS — Z79899 Other long term (current) drug therapy: Secondary | ICD-10-CM | POA: Insufficient documentation

## 2022-07-22 DIAGNOSIS — Z1152 Encounter for screening for COVID-19: Secondary | ICD-10-CM | POA: Diagnosis not present

## 2022-07-22 DIAGNOSIS — Z992 Dependence on renal dialysis: Secondary | ICD-10-CM | POA: Insufficient documentation

## 2022-07-22 DIAGNOSIS — E1122 Type 2 diabetes mellitus with diabetic chronic kidney disease: Secondary | ICD-10-CM | POA: Insufficient documentation

## 2022-07-22 DIAGNOSIS — G9340 Encephalopathy, unspecified: Secondary | ICD-10-CM | POA: Diagnosis not present

## 2022-07-22 DIAGNOSIS — I480 Paroxysmal atrial fibrillation: Secondary | ICD-10-CM | POA: Insufficient documentation

## 2022-07-22 DIAGNOSIS — Z7901 Long term (current) use of anticoagulants: Secondary | ICD-10-CM | POA: Insufficient documentation

## 2022-07-22 DIAGNOSIS — Z794 Long term (current) use of insulin: Secondary | ICD-10-CM | POA: Insufficient documentation

## 2022-07-22 DIAGNOSIS — I132 Hypertensive heart and chronic kidney disease with heart failure and with stage 5 chronic kidney disease, or end stage renal disease: Secondary | ICD-10-CM | POA: Diagnosis not present

## 2022-07-22 DIAGNOSIS — E11649 Type 2 diabetes mellitus with hypoglycemia without coma: Principal | ICD-10-CM | POA: Insufficient documentation

## 2022-07-22 LAB — CBC WITH DIFFERENTIAL/PLATELET
Abs Immature Granulocytes: 0.03 10*3/uL (ref 0.00–0.07)
Basophils Absolute: 0 10*3/uL (ref 0.0–0.1)
Basophils Relative: 1 %
Eosinophils Absolute: 0 10*3/uL (ref 0.0–0.5)
Eosinophils Relative: 0 %
HCT: 31.9 % — ABNORMAL LOW (ref 36.0–46.0)
Hemoglobin: 10.1 g/dL — ABNORMAL LOW (ref 12.0–15.0)
Immature Granulocytes: 1 %
Lymphocytes Relative: 10 %
Lymphs Abs: 0.6 10*3/uL — ABNORMAL LOW (ref 0.7–4.0)
MCH: 30.7 pg (ref 26.0–34.0)
MCHC: 31.7 g/dL (ref 30.0–36.0)
MCV: 97 fL (ref 80.0–100.0)
Monocytes Absolute: 0.3 10*3/uL (ref 0.1–1.0)
Monocytes Relative: 6 %
Neutro Abs: 4.6 10*3/uL (ref 1.7–7.7)
Neutrophils Relative %: 82 %
Platelets: 116 10*3/uL — ABNORMAL LOW (ref 150–400)
RBC: 3.29 MIL/uL — ABNORMAL LOW (ref 3.87–5.11)
RDW: 14.6 % (ref 11.5–15.5)
WBC: 5.6 10*3/uL (ref 4.0–10.5)
nRBC: 0 % (ref 0.0–0.2)

## 2022-07-22 LAB — CBG MONITORING, ED
Glucose-Capillary: 53 mg/dL — ABNORMAL LOW (ref 70–99)
Glucose-Capillary: 126 mg/dL — ABNORMAL HIGH (ref 70–99)
Glucose-Capillary: 43 mg/dL — CL (ref 70–99)
Glucose-Capillary: 124 mg/dL — ABNORMAL HIGH (ref 70–99)
Glucose-Capillary: 129 mg/dL — ABNORMAL HIGH (ref 70–99)

## 2022-07-22 LAB — COMPREHENSIVE METABOLIC PANEL
ALT: 10 U/L (ref 0–44)
AST: 23 U/L (ref 15–41)
Albumin: 3.4 g/dL — ABNORMAL LOW (ref 3.5–5.0)
Alkaline Phosphatase: 70 U/L (ref 38–126)
Anion gap: 11 (ref 5–15)
BUN: 16 mg/dL (ref 8–23)
CO2: 25 mmol/L (ref 22–32)
Calcium: 8.9 mg/dL (ref 8.9–10.3)
Chloride: 101 mmol/L (ref 98–111)
Creatinine, Ser: 2.34 mg/dL — ABNORMAL HIGH (ref 0.44–1.00)
GFR, Estimated: 21 mL/min — ABNORMAL LOW (ref >60)
Glucose, Bld: 67 mg/dL — ABNORMAL LOW (ref 70–99)
Potassium: 3.6 mmol/L (ref 3.5–5.1)
Sodium: 137 mmol/L (ref 135–145)
Total Bilirubin: 1.1 mg/dL (ref 0.3–1.2)
Total Protein: 7 g/dL (ref 6.5–8.1)

## 2022-07-22 LAB — RESP PANEL BY RT-PCR (RSV, FLU A&B, COVID)  RVPGX2
Influenza A by PCR: NEGATIVE
Influenza B by PCR: NEGATIVE
Resp Syncytial Virus by PCR: NEGATIVE
SARS Coronavirus 2 by RT PCR: NEGATIVE

## 2022-07-22 LAB — TSH: TSH: 4.281 u[IU]/mL (ref 0.350–4.500)

## 2022-07-22 LAB — HEPATITIS B SURFACE ANTIGEN: Hepatitis B Surface Ag: NONREACTIVE

## 2022-07-22 LAB — LIPASE, BLOOD: Lipase: 25 U/L (ref 11–51)

## 2022-07-22 MED ORDER — ANTICOAGULANT SODIUM CITRATE 4% (200MG/5ML) IV SOLN: 5.0000 mL | Status: DC | PRN

## 2022-07-22 MED ORDER — DEXTROSE 50 % IV SOLN: 25.0000 g | Freq: Once | INTRAVENOUS | Status: AC

## 2022-07-22 MED ORDER — INSULIN ASPART 100 UNIT/ML IJ SOLN: 0.0000 [IU] | Freq: Three times a day (TID) | INTRAMUSCULAR | Status: DC

## 2022-07-22 MED ORDER — DEXTROSE 50 % IV SOLN
INTRAVENOUS | Status: AC
  Administered 2022-07-22: 25 g via INTRAVENOUS
  Filled 2022-07-22: qty 50

## 2022-07-22 MED ORDER — GLUCAGON HCL RDNA (DIAGNOSTIC) 1 MG IJ SOLR
INTRAMUSCULAR | Status: AC
  Filled 2022-07-22: qty 1

## 2022-07-22 MED ORDER — CHLORHEXIDINE GLUCONATE CLOTH 2 % EX PADS
6.0000 | MEDICATED_PAD | Freq: Every day | CUTANEOUS | Status: DC
  Administered 2022-07-23: 6 via TOPICAL

## 2022-07-22 MED ORDER — HEPARIN SODIUM (PORCINE) 1000 UNIT/ML DIALYSIS
1000.0000 [IU] | INTRAMUSCULAR | Status: DC | PRN
  Filled 2022-07-22: qty 1

## 2022-07-22 MED ORDER — ALTEPLASE 2 MG IJ SOLR: 2.0000 mg | Freq: Once | INTRAMUSCULAR | Status: DC | PRN

## 2022-07-22 MED ORDER — APIXABAN 2.5 MG PO TABS
2.5000 mg | ORAL_TABLET | Freq: Two times a day (BID) | ORAL | Status: DC
  Administered 2022-07-22: 2.5 mg via ORAL
  Filled 2022-07-22 (×2): qty 1

## 2022-07-22 MED ORDER — GLUCAGON HCL RDNA (DIAGNOSTIC) 1 MG IJ SOLR
1.0000 mg | Freq: Once | INTRAMUSCULAR | Status: AC
  Administered 2022-07-22: 1 mg via INTRAMUSCULAR

## 2022-07-22 NOTE — ED Notes (Signed)
Help get patient on the monitor did EKG patiebnt is resting with family at bedside

## 2022-07-22 NOTE — Progress Notes (Signed)
Received patient in bed to unit.  Alert and oriented.  Informed consent signed and in chart.   [place] duration:3.5  Patient tolerated well.  Transported back to the room  Alert, without acute distress.  Hand-off given to patient's nurse.   Access used: dialysis cath Access issues: none  Total UF removed: 1500L Medication(s) given: heparin 1.6 per port Post HD VS: see table below Post HD weight:  on ED stretcher that can't weigh and pt is unable to stand to weigh    07/22/22 1856  Vitals  Temp 98.1 F (36.7 C)  Temp Source Oral  BP 130/64  MAP (mmHg) 85  BP Location Left Arm  BP Method Automatic  Patient Position (if appropriate) Lying  Pulse Rate 80  Pulse Rate Source Monitor  ECG Heart Rate 75  Resp 13  Oxygen Therapy  SpO2 100 %  O2 Device Room Air  Patient Activity (if Appropriate) In bed  Pulse Oximetry Type Continuous  During Treatment Monitoring  HD Safety Checks Performed Yes  Intra-Hemodialysis Comments Tolerated well  Post Treatment  Dialyzer Clearance Clear  Duration of HD Treatment -hour(s) 3.5 hour(s)  Hemodialysis Intake (mL) 0 mL  Liters Processed 84  Fluid Removed (mL) 1500 mL  Tolerated HD Treatment Yes  Hemodialysis Catheter Right Internal jugular Double lumen Permanent (Tunneled)  Placement Date/Time: 06/15/21 0919   Placed prior to admission: No  Time Out: Correct patient;Correct site;Correct procedure  Maximum sterile barrier precautions: Hand hygiene;Cap;Mask;Sterile gown;Sterile gloves;Large sterile sheet  Site Prep: Chlorh...  Site Condition No complications  Blue Lumen Status Flushed;Heparin locked;Dead end cap in place  Red Lumen Status Flushed;Heparin locked;Dead end cap in place  Catheter fill solution Heparin 1000 units/ml  Catheter fill volume (Arterial) 1.6 cc  Catheter fill volume (Venous) 1.6  Dressing Type Transparent  Post treatment catheter status Capped and [facility]

## 2022-07-22 NOTE — Consult Note (Signed)
Renal Service Consult Note [facility]  [name] 07/22/2022 [name], MD Requesting Physician: Dr Roosevelt Locks  Reason for Consult: ESRD pt w/ AMS and hypoglycemia HPI: The patient is a 82 y.o. year-old w/ PMH as below who presented to ED w/ AMS brought from home. Per EMS bs was 36. Temp was 91 deg F. Pt admitted. We are asked to see for dialysis.   Pt seen in ED. She is wide awake now and feeding herself lunch. She is a frail elderly lady, dtr is at the bedside. Pt denies any N/V, CP or abd pain.     ROS - denies CP, no joint pain, no HA, no blurry vision, no rash, no diarrhea, no nausea/ vomiting, no dysuria, no difficulty voiding   Past Medical History  Past Medical History:  Diagnosis Date   CHF (congestive heart failure) (HCC)    Chronic kidney disease    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   Diabetes mellitus without complication ([place])    type 2   Dyspnea    with exertion   Hypertension    Neuromuscular disorder (HCC)    neuropathy bil feet   PAF (paroxysmal atrial fibrillation) ([place]) 08/2020   Pneumonia    Pulmonary hypertension ([place])    Past Surgical History  Past Surgical History:  Procedure Laterality Date   A/V FISTULAGRAM Right 07/07/2022   Procedure: A/V Fistulagram;  Surgeon: Broadus John, MD;  Location: [place] [facility];  Service: Cardiovascular;  Laterality: Right;   A/V SHUNT INTERVENTION N/A 07/07/2022   Procedure: A/V SHUNT INTERVENTION;  Surgeon: Broadus John, MD;  Location: [place] [facility];  Service: Cardiovascular;  Laterality: N/A;   AV FISTULA PLACEMENT Left 06/18/2021   Procedure: LEFT ARM ARTERIOVENOUS (AV) FISTULA CREATION;  Surgeon: [name], MD;  Location: [place];  Service: Vascular;  Laterality: Left;   AV FISTULA PLACEMENT Right 01/29/2022   Procedure: RIGHT ARM BRACHIOCEPHALIC ARTERIOVENOUS (AV) FISTULA CREATION;  Surgeon: Marty Heck, MD;  Location: [place];  Service: Vascular;  Laterality: Right;    [place] Left 08/28/2021   Procedure: LEFT ARM BASILIC VEIN TRANSPOSITION;  Surgeon: [name], MD;  Location: [place];  Service: Vascular;  Laterality: Left;   CARDIOVERSION N/A 01/14/2021   Procedure: CARDIOVERSION;  Surgeon: Adrian Prows, MD;  Location: [place];  Service: Cardiovascular;  Laterality: N/A;   IR FLUORO GUIDE CV LINE RIGHT  06/15/2021   IR US GUIDE VASC ACCESS RIGHT  06/15/2021   TONSILLECTOMY     Family History  Family History  Family history unknown: Yes   Social History  reports that she has never smoked. She has never been exposed to tobacco smoke. She has never used smokeless tobacco. She reports that she does not drink alcohol and does not use drugs. Allergies  Allergies  Allergen Reactions   Amlodipine Other (See Comments)    Patient has LE edema to this med at higher doses (10 mg)   Home medications Prior to Admission medications   Medication Sig Start Date End Date Taking? Authorizing Provider  apixaban (ELIQUIS) 2.5 MG TABS tablet Take 1 tablet (2.5 mg total) by mouth 2 (two) times daily. 03/01/22   Ernst Spell, NP  HUMULIN 70/30 KWIKPEN (70-30) 100 UNIT/ML KwikPen Inject 5 Units into the skin daily. Patient taking differently: Inject 10 Units into the skin at bedtime. 05/28/21   Elmarie Shiley, MD     Vitals:   07/22/22 1200 07/22/22 1215  07/22/22 1230 07/22/22 1300  BP: (!) 124/55  (!) 110/56 (!) 121/59  Pulse: 74 81 80 77  Resp: 11 13 14 14   Temp:      TempSrc:      SpO2: 100% 100% 100% 100%  Weight:      Height:       Exam Gen alert, no distress, elderly and frail, pleasant No rash, cyanosis or gangrene Sclera anicteric, throat clear  No jvd or bruits Chest clear bilat to bases, no rales/ wheezing RRR no MRG Abd soft ntnd no mass or ascites +bs GU defer MS no joint effusions or deformity Ext no LE or UE edema, no wounds or ulcers Neuro is alert, Ox 3 , nf    RIJ TDC/ RUE AVF+bruit      OP HD: TTS NW 4h  400 / 1.5  50kg   3K/2.5 bath  RIJ TDC (AVF pend)   Hep none   - last HD 3/19, post wt 50.2kg  - rocaltrol 1.0 mcg po tiw - mircera 75 mcg IV q 2 wks, last 3/19, due in 12 days   Assessment/ Plan: Hypoglycemia - w/ AMS, better in ED Hypothermia - has warming blanket, possibly d/t #1, should r/o cath sepsis, will order bcx x 2.  ESRD - on HD TTS. Plan HD today upstairs.  HTN/ volume - BP's are not high, low-normal. Small UF goal.  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  IDDM - on insulin, per pmd PAF - in NSR here      Kelly Splinter  MD [facility] 07/22/2022, 1:29 PM  Recent Labs  Lab 07/22/22 1000  HGB 10.1*  ALBUMIN 3.4*  CALCIUM 8.9  CREATININE 2.34*  K 3.6   Inpatient medications:  apixaban  2.5 mg Oral BID   insulin aspart  0-6 Units Subcutaneous TID WC   sodium chloride flush  3 mL Intravenous Q12H   sodium chloride flush  3 mL Intravenous Q12H    sodium chloride     sodium chloride     sodium chloride, sodium chloride

## 2022-07-22 NOTE — ED Triage Notes (Signed)
Pt arrived via [facility] from home. Per EMS, pt's daughter found pt unresponsive in bed. Per EMS, bs was 36. EMS gave dextrose 12.5 mg and that brought glucose up to 114. Per EMS, pt was slow to come back to baseline, but is now A&Ox4. Pt dialysis pt and goes Tues, Thurs, Sat. Pt didn't go to today. Per EMS, pt c/o right hip pain.

## 2022-07-22 NOTE — ED Provider Notes (Signed)
[place] Provider Note   CSN: [id number] Arrival date & time: 07/22/22  0915     History  Chief Complaint  Patient presents with   Hypoglycemia   Altered Mental Status    [name] is a 82 y.o. female.  With medical history of paroxysmal atrial fibrillation anticoagulated on Eliquis, CHF, CKD, diabetes, hypertension who presents to the emergency department with hypoglycemia.  Level 5 caveat: Altered mental status.  However patient states that she generally does not feel well and complains of abdominal pain.  Patient brought in by EMS from home.  Per EMS, daughter found the patient unresponsive in bed this morning.  Blood glucose was 36.  EMS gave 12.5 mg of dextrose which brought her glucose back up to 114.  Patient is dialysis, Tuesday, Thursday, Saturday.  Did not present to dialysis today.   Hypoglycemia Associated symptoms: altered mental status   Altered Mental Status      Home Medications Prior to Admission medications   Medication Sig Start Date End Date Taking? Authorizing Provider  apixaban (ELIQUIS) 2.5 MG TABS tablet Take 1 tablet (2.5 mg total) by mouth 2 (two) times daily. 03/01/22   Ernst Spell, NP  HUMULIN 70/30 KWIKPEN (70-30) 100 UNIT/ML KwikPen Inject 5 Units into the skin daily. Patient taking differently: Inject 10 Units into the skin at bedtime. 05/28/21   Regalado, Jerald Kief A, MD      Allergies    Amlodipine    Review of Systems   Review of Systems  Unable to perform ROS: Mental status change    Physical Exam Updated Vital Signs BP (!) 124/55   Pulse 81   Temp (!) 91.9 F (33.3 C) (Rectal)   Resp 13   Ht 5\' 5"  (1.651 m)   Wt 60.8 kg   SpO2 100%   BMI 22.31 kg/m  Physical Exam Vitals and nursing note reviewed.  Constitutional:      General: She is not in acute distress.    Appearance: She is ill-appearing.  HENT:     Head: Normocephalic.     Mouth/Throat:     Mouth: Mucous  membranes are dry.  Eyes:     General: No scleral icterus.    Extraocular Movements: Extraocular movements intact.  Cardiovascular:     Rate and Rhythm: Bradycardia present. Rhythm irregular.     Heart sounds: No murmur heard. Pulmonary:     Effort: Pulmonary effort is normal. No respiratory distress.     Breath sounds: Normal breath sounds.  Abdominal:     General: Bowel sounds are normal. There is no distension.     Palpations: Abdomen is soft.     Tenderness: There is no abdominal tenderness.  Skin:    General: Skin is warm and dry.     Capillary Refill: Capillary refill takes less than 2 seconds.  Neurological:     General: No focal deficit present.     Mental Status: She is alert. She is confused.     Motor: Motor function is intact.     Comments: Slow to respond.  She knows name, year, disoriented to place  Psychiatric:        Attention and Perception: Attention normal.        Speech: Speech is delayed.        Behavior: Behavior is slowed. Behavior is cooperative.     ED Results / Procedures / Treatments   Labs (all labs ordered are listed, but only  abnormal results are displayed) Labs Reviewed  COMPREHENSIVE METABOLIC PANEL - Abnormal; Notable for the following components:      Result Value   Glucose, Bld 67 (*)    Creatinine, Ser 2.34 (*)    Albumin 3.4 (*)    GFR, Estimated 21 (*)    All other components within normal limits  CBC WITH DIFFERENTIAL/PLATELET - Abnormal; Notable for the following components:   RBC 3.29 (*)    Hemoglobin 10.1 (*)    HCT 31.9 (*)    Platelets 116 (*)    Lymphs Abs 0.6 (*)    All other components within normal limits  CBG MONITORING, ED - Abnormal; Notable for the following components:   Glucose-Capillary 53 (*)    All other components within normal limits  CBG MONITORING, ED - Abnormal; Notable for the following components:   Glucose-Capillary 43 (*)    All other components within normal limits  CBG MONITORING, ED - Abnormal;  Notable for the following components:   Glucose-Capillary 126 (*)    All other components within normal limits  CBG MONITORING, ED - Abnormal; Notable for the following components:   Glucose-Capillary 124 (*)    All other components within normal limits  CBG MONITORING, ED - Abnormal; Notable for the following components:   Glucose-Capillary 129 (*)    All other components within normal limits  RESP PANEL BY RT-PCR (RSV, FLU A&B, COVID)  [id number]  LIPASE, BLOOD  TSH  URINALYSIS, W/ REFLEX TO CULTURE (INFECTION SUSPECTED)  CBG MONITORING, ED    EKG EKG Interpretation  Date/Time:  Thursday July 22 2022 09:16:53 EDT Ventricular Rate:  58 PR Interval:    QRS Duration: 144 QT Interval:  523 QTC Calculation: 514 R Axis:   153 Text Interpretation: Atrial fibrillation Ventricular premature complex Right bundle branch block Anterior infarct, old No significant change was found Artifact Confirmed by Ezequiel Essex [id number]) on 07/22/2022 10:43:32 AM  Radiology CT Head Wo Contrast  Result Date: 07/22/2022 CLINICAL DATA:  Mental status change, unknown cause. EXAM: CT HEAD WITHOUT CONTRAST TECHNIQUE: Contiguous axial images were obtained from the base of the skull through the vertex without intravenous contrast. RADIATION DOSE REDUCTION: This exam was performed according to the departmental dose-optimization program which includes automated exposure control, adjustment of the mA and/or kV according to patient size and/or use of iterative reconstruction technique. COMPARISON:  Head CT 06/05/2022. FINDINGS: Brain: No acute intracranial hemorrhage. 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 Old left cerebellar infarct. Linear hypoattenuation in the left middle cerebellar peduncle is favored to reflect beam hardening artifact from the skull base. No hydrocephalus or extra-axial collection. No mass or midline  shift. Vascular: No hyperdense vessel or unexpected calcification. Skull: No calvarial fracture or suspicious bone lesion. Skull base is unremarkable. Sinuses/Orbits: Unremarkable. Other: None. IMPRESSION: 1. No acute intracranial hemorrhage. 2. 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 Electronically Signed   By: Emmit Alexanders M.D.   On: 07/22/2022 10:59   DG Chest Port 1 View  Result Date: 07/22/2022 CLINICAL DATA:  Sepsis EXAM: PORTABLE CHEST 1 VIEW COMPARISON:  04/03/2022 x-ray FINDINGS: Enlarged cardiopericardial silhouette with calcified aorta. No consolidation, pneumothorax or effusion. No edema. Overlapping cardiac leads. Large caliber right IJ catheter again noted with tip at the SVC right atrial junction. Degenerative changes of the shoulders. Elevated right humeral head. Please correlate for a rotator cuff tear. IMPRESSION: Right IJ large-bore catheter. Enlarged  cardiopericardial silhouette. No edema Electronically Signed   By: Jill Side M.D.   On: 07/22/2022 10:19    Procedures .Critical Care  Performed by: Mickie Hillier, PA-C Authorized by: Mickie Hillier, PA-C   Critical care provider statement:    Critical care time (minutes):  45   Critical care time was exclusive of:  Separately billable procedures and treating other patients   Critical care was necessary to treat or prevent imminent or life-threatening deterioration of the following conditions:  Metabolic crisis and endocrine crisis   Critical care was time spent personally by me on the following activities:  Development of treatment plan with patient or surrogate, blood draw for specimens, discussions with primary provider, evaluation of patient's response to treatment, examination of patient, interpretation of cardiac output measurements, obtaining history from patient or surrogate, review of old charts, re-evaluation of patient's condition, pulse oximetry, ordering and review of radiographic  studies, ordering and review of laboratory studies, ordering and performing treatments and interventions and vascular access procedures   I assumed direction of critical care for this patient from another provider in my specialty: no     Care discussed with: admitting provider      Medications Ordered in ED Medications  apixaban (ELIQUIS) tablet 2.5 mg (has no administration in time range)  insulin aspart (novoLOG) injection 0-6 Units (has no administration in time range)  dextrose 50 % solution 25 g (25 g Intravenous Given 07/22/22 1000)  glucagon (human recombinant) (GLUCAGEN) injection 1 mg (1 mg Intramuscular Given 07/22/22 [id number])    ED Course/ Medical Decision Making/ A&P Clinical Course as of 07/22/22 1308  Thu Jul 22, 2022  0928 Attempted to call daughter without answer [LA]  0948 Given 1mg  glucagon given difficulty with IV access. RN at bedside attempting [place] IV at this time.  [LA]  [id number] Dr. Wyvonnia Dusky obtaining EJ access. Amp of D50 given  [LA]    Clinical Course User Index [LA] Mickie Hillier, PA-C    Medical Decision Making Amount and/or Complexity of Data Reviewed Labs: ordered. Radiology: ordered.  Risk Prescription drug management. Decision regarding hospitalization.  Initial Impression and Ddx 82 year old female who presents to the emergency department with altered mental status and hypoglycemia Patient PMH that increases complexity of ED encounter: End-stage renal disease on hemodialysis, paroxysmal atrial fibrillation, CHF, diabetes, hypertension  Interpretation of Diagnostics I independent reviewed and interpreted the labs as followed: Glucose improved and stabilized in the 120s, CMP with stable creatinine without electrolyte dysfunction.  CBC with stable anemia.  TSH is normal.  Lipase normal.  Respiratory panel is negative.  - I independently visualized the following imaging with scope of interpretation limited to determining acute life threatening conditions  related to emergency care: CT head and chest x-ray, which revealed no acute findings  Patient Reassessment and Ultimate Disposition/Management 82 year old female who presents with altered mental status and hypoglycemia.  On my initial evaluation she is ill-appearing, mildly confused, slow to respond.  She had half an amp of D50 and route with EMS with reported blood glucose to 112.  On presentation we repeated her glucose which was 53.  I have ordered an amp of D50.  She is in slow A-fib.  Will order infectious workup.  She is end-stage renal disease on hemodialysis, taking insulin, which may be nidus of hypoglycemia.  I have also ordered every 30-minute blood glucoses for the first 2 hours and then hourly after that.  0945: Daughter is at bedside.  She states  that she attempted to get her mother out this morning for dialysis and was unable to.  She states that this has happened before and thought that her blood sugar may be low so she called EMS.  She denies any recent illnesses.  She did complete her dialysis on Tuesday.  States that a few days ago she was complaining of abdominal pain but she denies her having any vomiting or diarrhea or fevers.  Once we are able to get good IV access with an EJ placed by the attending, patient given a 25 g bolus of dextrose and has had maintenance of normal blood sugar level since then. CMP, CBC, lipase and TSH are stable.  No COVID or flu.  No evidence of pneumonia.  Her CT head is negative.  She has a nonfocal neurological exam.  TSH was obtained because the patient was also hypothermic, mildly slow A-fib, hypoglycemia.  Again this was normal.  Wondering if she was given a higher dose of insulin last night and given that she has CKD has not cleared it yet causing her hypoglycemia.  She will need to be admitted for observation.  Consulted and spoke with Dr. Roosevelt Locks, hospitalist who agrees to admit the patient.  Patient management required discussion with the following  services or consulting groups:  Hospitalist Service  Complexity of Problems Addressed Acute illness or injury that poses threat of life of bodily function  Additional Data Reviewed and Analyzed Further history obtained from: EMS on arrival, Further history from spouse/family member, Past medical history and medications listed in the EMR, Prior ED visit notes, Recent discharge summary, Care Everywhere, and Prior labs/imaging results  Patient Encounter Risk Assessment SDOH impact on management and Consideration of hospitalization  Final Clinical Impression(s) / ED Diagnoses Final diagnoses:  Hypoglycemia    Rx / DC Orders ED Discharge Orders     None         Mickie Hillier, PA-C 07/22/22 1308    Ezequiel Essex, MD 07/22/22 1738

## 2022-07-22 NOTE — H&P (Signed)
History and Physical    [name] [id number] DOB: 04-Apr-1941 DOA: 07/22/2022  PCP: Trey Sailors, PA (Confirm with patient/family/NH records and if not entered, this has to be entered at [facility] point of entry) Patient coming from: Home  I have personally briefly reviewed patient's old medical records in [place]  Chief Complaint: Feeling ok  HPI: [name] is a 82 y.o. female with medical history significant of IDDM, ESRD on HD TTS, HTN, chronic HFpEF, PAF on Eliquis presented with altered mentations, with confusion and hypoglycemia.  Was found unresponsive this morning by daughter.  Who called EMS.  EMS arrived and found patient glucose 36 and half amp of D50 given patient woke up glucose increased to 114.  Patient arrived in the ED, was found hypoglycemic 53 and D50 and 1 dose of glucagon IM given in the ED.  CT head negative for acute findings.  Patient was also found hypothermia in the ED with temperature 91.9.  Patient denied any cough shortness of breath or any skin lesions.  Her last hemodialysis was 2 days ago without event.  Family reported that the patient has been on 70/30 for diabetes control "for many years", usually administered by granddaughter before patient goes to bed.  Patient checks her fingerstick every evening and recent weeks see patient's imaging fingerstick running in the 110-120s.  Family reported that patient had several episodes of hypoglycemia in the morning recently treated with orange juice and candies and candies but never before today patient was found passed out.   Review of Systems: As per HPI otherwise 14 point review of systems negative.    Past Medical History:  Diagnosis Date   CHF (congestive heart failure) ([place])    Chronic kidney disease    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   Diabetes mellitus without complication (HCC)    type 2   Dyspnea    with exertion   Hypertension    Neuromuscular disorder (HCC)    neuropathy bil feet    PAF (paroxysmal atrial fibrillation) ([place]) 08/2020   Pneumonia    Pulmonary hypertension ([place])     Past Surgical History:  Procedure Laterality Date   A/V FISTULAGRAM Right 07/07/2022   Procedure: A/V Fistulagram;  Surgeon: Broadus John, MD;  Location: [place] [facility];  Service: Cardiovascular;  Laterality: Right;   A/V SHUNT INTERVENTION N/A 07/07/2022   Procedure: A/V SHUNT INTERVENTION;  Surgeon: Broadus John, MD;  Location: [place] [facility];  Service: Cardiovascular;  Laterality: N/A;   AV FISTULA PLACEMENT Left 06/18/2021   Procedure: LEFT ARM ARTERIOVENOUS (AV) FISTULA CREATION;  Surgeon: [name], MD;  Location: [place];  Service: Vascular;  Laterality: Left;   AV FISTULA PLACEMENT Right 01/29/2022   Procedure: RIGHT ARM BRACHIOCEPHALIC ARTERIOVENOUS (AV) FISTULA CREATION;  Surgeon: Marty Heck, MD;  Location: [place];  Service: Vascular;  Laterality: Right;   [place] Left 08/28/2021   Procedure: LEFT ARM BASILIC VEIN TRANSPOSITION;  Surgeon: [name], MD;  Location: [place];  Service: Vascular;  Laterality: Left;   CARDIOVERSION N/A 01/14/2021   Procedure: CARDIOVERSION;  Surgeon: Adrian Prows, MD;  Location: [place];  Service: Cardiovascular;  Laterality: N/A;   IR FLUORO GUIDE CV LINE RIGHT  06/15/2021   IR US GUIDE VASC ACCESS RIGHT  06/15/2021   TONSILLECTOMY       reports that she has never smoked. She has never been exposed to tobacco smoke. She has never used smokeless tobacco.  She reports that she does not drink alcohol and does not use drugs.  Allergies  Allergen Reactions   Amlodipine Other (See Comments)    Patient has LE edema to this med at higher doses (10 mg)    Family History  Family history unknown: Yes     Prior to Admission medications   Medication Sig Start Date End Date Taking? Authorizing Provider  VITAMIN D PO Take 1 capsule by mouth at bedtime.   Yes [provider]   apixaban (ELIQUIS) 2.5 MG TABS tablet Take 1 tablet (2.5 mg total) by mouth 2 (two) times daily. Patient not taking: Reported on 07/22/2022 03/01/22   Ernst Spell, NP  HUMULIN 70/30 KWIKPEN (70-30) 100 UNIT/ML KwikPen Inject 5 Units into the skin daily. Patient not taking: Reported on 07/22/2022 05/28/21   Elmarie Shiley, MD    Physical Exam: Vitals:   07/22/22 1230 07/22/22 1300 07/22/22 1330 07/22/22 1336  BP: (!) 110/56 (!) 121/59 109/72   Pulse: 80 77 68   Resp: 14 14 14    Temp:    (!) 95.7 F (35.4 C)  TempSrc:    Rectal  SpO2: 100% 100% 100%   Weight:      Height:        Constitutional: NAD, calm, comfortable Vitals:   07/22/22 1230 07/22/22 1300 07/22/22 1330 07/22/22 1336  BP: (!) 110/56 (!) 121/59 109/72   Pulse: 80 77 68   Resp: 14 14 14    Temp:    (!) 95.7 F (35.4 C)  TempSrc:    Rectal  SpO2: 100% 100% 100%   Weight:      Height:       Eyes: PERRL, lids and conjunctivae normal ENMT: Mucous membranes are moist. Posterior pharynx clear of any exudate or lesions.Normal dentition.  Neck: normal, supple, no masses, no thyromegaly Respiratory: clear to auscultation bilaterally, no wheezing, no crackles. Normal respiratory effort. No accessory muscle use.  Cardiovascular: Regular rate and rhythm, no murmurs / rubs / gallops. No extremity edema. 2+ pedal pulses. No carotid bruits.  Abdomen: no tenderness, no masses palpated. No hepatosplenomegaly. Bowel sounds positive.  Musculoskeletal: no clubbing / cyanosis. No joint deformity upper and lower extremities. Good ROM, no contractures. Normal muscle tone.  Skin: no rashes, lesions, ulcers. No induration Neurologic: CN 2-12 grossly intact. Sensation intact, DTR normal. Strength 5/5 in all 4.  Psychiatric: Normal judgment and insight. Alert and oriented x 3. Normal mood.    Labs on Admission: I have personally reviewed following labs and imaging studies  CBC: Recent Labs  Lab 07/22/22 1000  WBC 5.6   NEUTROABS 4.6  HGB 10.1*  HCT 31.9*  MCV 97.0  PLT 99991111*   Basic Metabolic Panel: Recent Labs  Lab 07/22/22 1000  NA 137  K 3.6  CL 101  CO2 25  GLUCOSE 67*  BUN 16  CREATININE 2.34*  CALCIUM 8.9   GFR: Estimated Creatinine Clearance: 17.3 mL/min (A) (by C-G formula based on SCr of 2.34 mg/dL (H)). Liver Function Tests: Recent Labs  Lab 07/22/22 1000  AST 23  ALT 10  ALKPHOS 70  BILITOT 1.1  PROT 7.0  ALBUMIN 3.4*   Recent Labs  Lab 07/22/22 1000  LIPASE 25   No results for input(s): "AMMONIA" in the last 168 hours. Coagulation Profile: No results for input(s): "INR", "PROTIME" in the last 168 hours. Cardiac Enzymes: No results for input(s): "CKTOTAL", "CKMB", "CKMBINDEX", "TROPONINI" in the last 168 hours. BNP (last 3 results) No  results for input(s): "PROBNP" in the last 8760 hours. HbA1C: No results for input(s): "HGBA1C" in the last 72 hours. CBG: Recent Labs  Lab 07/22/22 0918 07/22/22 0951 07/22/22 1024 07/22/22 1054 07/22/22 1213  GLUCAP 53* 43* 126* 124* 129*   Lipid Profile: No results for input(s): "CHOL", "HDL", "LDLCALC", "TRIG", "CHOLHDL", "LDLDIRECT" in the last 72 hours. Thyroid Function Tests: Recent Labs    07/22/22 1030  TSH 4.281   Anemia Panel: No results for input(s): "VITAMINB12", "FOLATE", "FERRITIN", "TIBC", "IRON", "RETICCTPCT" in the last 72 hours. Urine analysis:    Component Value Date/Time   COLORURINE AMBER (A) 05/27/2022 2057   APPEARANCEUR HAZY (A) 05/27/2022 2057   LABSPEC 1.018 05/27/2022 2057   PHURINE 6.0 05/27/2022 2057   GLUCOSEU NEGATIVE 05/27/2022 2057   HGBUR SMALL (A) 05/27/2022 2057   BILIRUBINUR NEGATIVE 05/27/2022 2057   KETONESUR NEGATIVE 05/27/2022 2057   PROTEINUR 100 (A) 05/27/2022 2057   NITRITE NEGATIVE 05/27/2022 2057   LEUKOCYTESUR MODERATE (A) 05/27/2022 2057    Radiological Exams on Admission: CT Head Wo Contrast  Result Date: 07/22/2022 CLINICAL DATA:  Mental status change,  unknown cause. EXAM: CT HEAD WITHOUT CONTRAST TECHNIQUE: Contiguous axial images were obtained from the base of the skull through the vertex without intravenous contrast. RADIATION DOSE REDUCTION: This exam was performed according to the departmental dose-optimization program which includes automated exposure control, adjustment of the mA and/or kV according to patient size and/or use of iterative reconstruction technique. COMPARISON:  Head CT 06/05/2022. FINDINGS: Brain: No acute intracranial hemorrhage. 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 Old left cerebellar infarct. Linear hypoattenuation in the left middle cerebellar peduncle is favored to reflect beam hardening artifact from the skull base. No hydrocephalus or extra-axial collection. No mass or midline shift. Vascular: No hyperdense vessel or unexpected calcification. Skull: No calvarial fracture or suspicious bone lesion. Skull base is unremarkable. Sinuses/Orbits: Unremarkable. Other: None. IMPRESSION: 1. No acute intracranial hemorrhage. 2. 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 Electronically Signed   By: Emmit Alexanders M.D.   On: 07/22/2022 10:59   DG Chest Port 1 View  Result Date: 07/22/2022 CLINICAL DATA:  Sepsis EXAM: PORTABLE CHEST 1 VIEW COMPARISON:  04/03/2022 x-ray FINDINGS: Enlarged cardiopericardial silhouette with calcified aorta. No consolidation, pneumothorax or effusion. No edema. Overlapping cardiac leads. Large caliber right IJ catheter again noted with tip at the SVC right atrial junction. Degenerative changes of the shoulders. Elevated right humeral head. Please correlate for a rotator cuff tear. IMPRESSION: Right IJ large-bore catheter. Enlarged cardiopericardial silhouette. No edema Electronically Signed   By: Jill Side M.D.   On: 07/22/2022 10:19    EKG: Independently reviewed.  Sinus rhythm,  no acute ST changes.  Assessment/Plan Principal Problem:   Hypoglycemia Active Problems:   Essential hypertension   ESRD (end stage renal disease) (HCC)   Acute encephalopathy   Hypoglycemia due to insulin  (please populate well all problems here in Problem List. (For example, if patient is on BP meds at home and you resume or decide to hold them, it is a problem that needs to be her. Same for CAD, COPD, HLD and so on)  Acute metabolic encephalopathy -Secondary to acute hypoglycemia -Improved after D50 and Glucagon.  Likely secondary to long-acting insulin.  Recommend discontinue 70/30.  Last A1c 8.2 in March 2023.  Probably can discharge home with Januvia.  Discussed with patient daughter at bedside. -Other DDx,  discussed with nephrology, blood culture x 2 sent to rule out bacteremia.  Hypothermia -Likely secondary to hypoglycemia, blood cultures drawn to rule out bacteremia.  Clinically there is no symptoms signs of active infection.  And body temperature improves after glucose level came up.  ESRD on HD -Nephrology consulted, arrange for HD today.  IDDM -As above.  HFpEF -Euvolemic, HD today.  PAF -In sinus rhythm, monitor for 24 hours. -Continue Eliquis, CT head reassuring.  DVT prophylaxis: Eliquis Code Status: Full code Family Communication: Daughter at bedside Disposition Plan: Expect less than 2 midnight hospital stay Consults called: Nephrology Admission status: Telemetry observation   Lequita Halt MD [facility] Pager [phone]  07/22/2022, 2:08 PM

## 2022-07-22 NOTE — ED Notes (Signed)
Pt placed on bair hugger

## 2022-07-22 NOTE — Procedures (Signed)
   I was present at this dialysis session, have reviewed the session itself and made  appropriate changes Kelly Splinter MD [facility] pager [phone]   07/22/2022, 3:21 PM

## 2022-07-22 NOTE — ED Notes (Signed)
Md at bedside

## 2022-07-22 NOTE — ED Provider Notes (Signed)
Angiocath insertion Performed by: Ezequiel Essex  Consent: Verbal consent obtained. Risks and benefits: risks, benefits and alternatives were discussed Time out: Immediately prior to procedure a "time out" was called to verify the correct patient, procedure, equipment, support staff and site/side marked as required.  Preparation: Patient was prepped and draped in the usual sterile fashion.  Vein Location: L EJ  Not Ultrasound Guided  Gauge: 20  Normal blood return and flush without difficulty Patient tolerance: Patient tolerated the procedure well with no immediate complications.     Ezequiel Essex, MD 07/22/22 1005

## 2022-07-22 NOTE — ED Notes (Signed)
Food provided to pt and family member

## 2022-07-23 ENCOUNTER — Other Ambulatory Visit (HOSPITAL_COMMUNITY): Payer: Self-pay

## 2022-07-23 DIAGNOSIS — E162 Hypoglycemia, unspecified: Secondary | ICD-10-CM | POA: Diagnosis not present

## 2022-07-23 LAB — COMPREHENSIVE METABOLIC PANEL
ALT: 8 U/L (ref 0–44)
AST: 18 U/L (ref 15–41)
Albumin: 3.2 g/dL — ABNORMAL LOW (ref 3.5–5.0)
Alkaline Phosphatase: 73 U/L (ref 38–126)
Anion gap: 12 (ref 5–15)
BUN: 9 mg/dL (ref 8–23)
CO2: 26 mmol/L (ref 22–32)
Calcium: 8.7 mg/dL — ABNORMAL LOW (ref 8.9–10.3)
Chloride: 95 mmol/L — ABNORMAL LOW (ref 98–111)
Creatinine, Ser: 1.83 mg/dL — ABNORMAL HIGH (ref 0.44–1.00)
GFR, Estimated: 28 mL/min — ABNORMAL LOW (ref >60)
Glucose, Bld: 77 mg/dL (ref 70–99)
Potassium: 3.5 mmol/L (ref 3.5–5.1)
Sodium: 133 mmol/L — ABNORMAL LOW (ref 135–145)
Total Bilirubin: 0.7 mg/dL (ref 0.3–1.2)
Total Protein: 6.9 g/dL (ref 6.5–8.1)

## 2022-07-23 LAB — CBC WITH DIFFERENTIAL/PLATELET
Abs Immature Granulocytes: 0.03 10*3/uL (ref 0.00–0.07)
Basophils Absolute: 0 10*3/uL (ref 0.0–0.1)
Basophils Relative: 1 %
Eosinophils Absolute: 0.1 10*3/uL (ref 0.0–0.5)
Eosinophils Relative: 2 %
HCT: 29.6 % — ABNORMAL LOW (ref 36.0–46.0)
Hemoglobin: 9.4 g/dL — ABNORMAL LOW (ref 12.0–15.0)
Immature Granulocytes: 1 %
Lymphocytes Relative: 19 %
Lymphs Abs: 1.2 10*3/uL (ref 0.7–4.0)
MCH: 30.5 pg (ref 26.0–34.0)
MCHC: 31.8 g/dL (ref 30.0–36.0)
MCV: 96.1 fL (ref 80.0–100.0)
Monocytes Absolute: 0.7 10*3/uL (ref 0.1–1.0)
Monocytes Relative: 11 %
Neutro Abs: 4.4 10*3/uL (ref 1.7–7.7)
Neutrophils Relative %: 66 %
Platelets: 105 10*3/uL — ABNORMAL LOW (ref 150–400)
RBC: 3.08 MIL/uL — ABNORMAL LOW (ref 3.87–5.11)
RDW: 14.6 % (ref 11.5–15.5)
WBC: 6.5 10*3/uL (ref 4.0–10.5)
nRBC: 0 % (ref 0.0–0.2)

## 2022-07-23 LAB — BLOOD GAS, VENOUS
Acid-Base Excess: 8.3 mmol/L — ABNORMAL HIGH (ref 0.0–2.0)
Bicarbonate: 31.8 mmol/L — ABNORMAL HIGH (ref 20.0–28.0)
Drawn by: 2102
O2 Saturation: 99.3 %
Patient temperature: 36.5
pCO2, Ven: 38 mmHg — ABNORMAL LOW (ref 44–60)
pH, Ven: 7.53 — ABNORMAL HIGH (ref 7.25–7.43)
pO2, Ven: 119 mmHg — ABNORMAL HIGH (ref 32–45)

## 2022-07-23 LAB — GLUCOSE, CAPILLARY
Glucose-Capillary: 116 mg/dL — ABNORMAL HIGH (ref 70–99)
Glucose-Capillary: 98 mg/dL (ref 70–99)
Glucose-Capillary: 84 mg/dL (ref 70–99)

## 2022-07-23 LAB — MAGNESIUM: Magnesium: 1.7 mg/dL (ref 1.7–2.4)

## 2022-07-23 LAB — AMMONIA: Ammonia: 31 umol/L (ref 9–35)

## 2022-07-23 LAB — HEPATITIS B SURFACE ANTIBODY, QUANTITATIVE: Hep B S AB Quant (Post): >1000 m[IU]/mL (ref >9.9)

## 2022-07-23 LAB — PHOSPHORUS: Phosphorus: 2.1 mg/dL — ABNORMAL LOW (ref 2.5–4.6)

## 2022-07-23 LAB — TSH: TSH: 1.284 u[IU]/mL (ref 0.350–4.500)

## 2022-07-23 MED ORDER — LINAGLIPTIN 5 MG PO TABS
5.0000 mg | ORAL_TABLET | Freq: Every day | ORAL | Status: DC
  Filled 2022-07-23: qty 1

## 2022-07-23 MED ORDER — LINAGLIPTIN 5 MG PO TABS
5.0000 mg | ORAL_TABLET | Freq: Every day | ORAL | 1 refills | Status: DC
  Filled 2022-07-23: qty 30, 30d supply, fill #0

## 2022-07-23 NOTE — Progress Notes (Signed)
[facility] Progress Note   Subjective:   Patient seen and examined at bedside.  States she is feeling much better today.  Denies CP, SOB, abdominal pain, n/v/d and confusion.  Ready to go home.   Objective Vitals:   07/22/22 2021 07/23/22 0009 07/23/22 0349 07/23/22 0807  BP: (!) 120/56 (!) 108/53 (!) 108/55 104/63  Pulse: 72 84 86   Resp: 16 14 17 15   Temp: 97.6 F (36.4 C) 97.8 F (36.6 C) 98.3 F (36.8 C) 98.2 F (36.8 C)  TempSrc: Axillary Oral Oral Oral  SpO2: 99% 99% 100% 100%  Weight:      Height:       Physical Exam General:chronically ill appearing elderly female in NAD Heart:RRR, no mrg Lungs:CTAB, nml WOB on RA Abdomen:soft, NTND Extremities:no LE edema Dialysis Access: [facility]   Filed Weights   07/22/22 0920  Weight: 60.8 kg    Intake/Output Summary (Last 24 hours) at 07/23/2022 1104 Last data filed at 07/23/2022 0800 Gross per 24 hour  Intake 240 ml  Output 1500 ml  Net -1260 ml    Additional Objective Labs: Basic Metabolic Panel: Recent Labs  Lab 07/22/22 1000 07/23/22 0641  NA 137 133*  K 3.6 3.5  CL 101 95*  CO2 25 26  GLUCOSE 67* 77  BUN 16 9  CREATININE 2.34* 1.83*  CALCIUM 8.9 8.7*  PHOS  --  2.1*   Liver Function Tests: Recent Labs  Lab 07/22/22 1000 07/23/22 0641  AST 23 18  ALT 10 8  ALKPHOS 70 73  BILITOT 1.1 0.7  PROT 7.0 6.9  ALBUMIN 3.4* 3.2*   Recent Labs  Lab 07/22/22 1000  LIPASE 25   CBC: Recent Labs  Lab 07/22/22 1000 07/23/22 0641  WBC 5.6 6.5  NEUTROABS 4.6 4.4  HGB 10.1* 9.4*  HCT 31.9* 29.6*  MCV 97.0 96.1  PLT 116* 105*   Blood Culture    Component Value Date/Time   SDES BLOOD BLOOD LEFT HAND 07/22/2022 2044   SDES BLOOD BLOOD LEFT HAND 07/22/2022 2044   SPECREQUEST  07/22/2022 2044    BOTTLES DRAWN AEROBIC ONLY Blood Culture results may not be optimal due to an inadequate volume of blood received in culture bottles   SPECREQUEST  07/22/2022 2044    BOTTLES DRAWN AEROBIC  ONLY Blood Culture results may not be optimal due to an inadequate volume of blood received in culture bottles   CULT  07/22/2022 2044    NO GROWTH < 12 HOURS Performed at [facility], [address] [place], [place] [address]    CULT  07/22/2022 2044    NO GROWTH < 12 HOURS Performed at [facility] [address] [place], [place] [address]    REPTSTATUS PENDING 07/22/2022 2044   REPTSTATUS PENDING 07/22/2022 2044   CBG: Recent Labs  Lab 07/22/22 1024 07/22/22 1054 07/22/22 1213 07/23/22 0317 07/23/22 0847  GLUCAP 126* 124* 129* 116* 98    Studies/Results: CT Head Wo Contrast  Result Date: 07/22/2022 CLINICAL DATA:  Mental status change, unknown cause. EXAM: CT HEAD WITHOUT CONTRAST TECHNIQUE: Contiguous axial images were obtained from the base of the skull through the vertex without intravenous contrast. RADIATION DOSE REDUCTION: This exam was performed according to the departmental dose-optimization program which includes automated exposure control, adjustment of the mA and/or kV according to patient size and/or use of iterative reconstruction technique. COMPARISON:  Head CT 06/05/2022. FINDINGS: Brain: No acute intracranial hemorrhage. Unchanged severe chronic small-vessel  disease. 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 Old left cerebellar infarct. Linear hypoattenuation in the left middle cerebellar peduncle is favored to reflect beam hardening artifact from the skull base. No hydrocephalus or extra-axial collection. No mass or midline shift. Vascular: No hyperdense vessel or unexpected calcification. Skull: No calvarial fracture or suspicious bone lesion. Skull base is unremarkable. Sinuses/Orbits: Unremarkable. Other: None. IMPRESSION: 1. No acute intracranial hemorrhage. 2. 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 Electronically Signed   By: Emmit Alexanders M.D.   On: 07/22/2022 10:59    DG Chest Port 1 View  Result Date: 07/22/2022 CLINICAL DATA:  Sepsis EXAM: PORTABLE CHEST 1 VIEW COMPARISON:  04/03/2022 x-ray FINDINGS: Enlarged cardiopericardial silhouette with calcified aorta. No consolidation, pneumothorax or effusion. No edema. Overlapping cardiac leads. Large caliber right IJ catheter again noted with tip at the SVC right atrial junction. Degenerative changes of the shoulders. Elevated right humeral head. Please correlate for a rotator cuff tear. IMPRESSION: Right IJ large-bore catheter. Enlarged cardiopericardial silhouette. No edema Electronically Signed   By: Jill Side M.D.   On: 07/22/2022 10:19    Medications:   apixaban  2.5 mg Oral BID   Chlorhexidine Gluconate Cloth  6 each Topical Q0600   insulin aspart  0-6 Units Subcutaneous TID WC   linagliptin  5 mg Oral Daily    Dialysis Orders:  TTS NW 4h  400 / 1.5  50kg   3K/2.5 bath  RIJ TDC (AVF pend)   Hep none   - last HD 3/19, post wt 50.2kg  - rocaltrol 1.0 mcg po tiw - mircera 75 mcg IV q 2 wks, last 3/19, due in 12 days     Assessment/ Plan: Hypoglycemia - w/ AMS. Now resolved AMS - likely 2/2 #1. CT with no acute findings.  Resolved.  Hypothermia - resolved.  Likely 2/2 #1.  BC with NGTD ESRD - on HD TTS. HD tomorrow per regular schedule.  Can be completed as outpatient.   HTN/ volume - Bp low normal. Does not appear volume overloaded.  CXR with no pulmonary edema. Minimal UF with HD.   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 Not on binders.  IDDM - on insulin, per pmd PAF - in NSR here  Jen Mow, PA-C [place] 07/23/2022,11:04 AM  LOS: 0 days

## 2022-07-23 NOTE — Discharge Summary (Signed)
PATIENT DETAILS Name: [name] Age: 82 y.o. Sex: female Date of Birth: 1941-01-11 MRN: [id number]. Admitting Physician: Lequita Halt, MD [id number], [place]aud [place], [place]  Admit Date: 07/22/2022 Discharge date: 07/23/2022  Recommendations for Outpatient Follow-up:  Follow up with PCP in 1-2 weeks Allow permissive hyperglycemia-not a candidate for tight glycemic control given frailty/advanced age. Stopped insulin-I have placed on Tradjenta-A1c is pending-please follow.  Admitted From:  Home  Disposition: Home   Discharge Condition: fair  CODE STATUS:   Code Status: Full Code   Diet recommendation:  Diet Order             Diet - low sodium heart healthy           Diet Carb Modified           Diet renal/carb modified with fluid restriction Diet-HS Snack? Nothing; Fluid restriction: 1200 mL Fluid; Room service appropriate? 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  Brief Hospital Course: Hypoglycemia Likely related to insulin administration and not eating following insulin administration.  Looks like patient is on insulin 70/30-per patient-she was given insulin-and then she did not eat.  Per daughter who I spoke to this morning- patient gets anywhere from 3-15 units depending on her CBGs level. A1c pending-but last A1c September 2023 was 8.2.  She is not a candidate for tight glycemic control Given advanced age/frailty-will stop insulin and just place her on Tradjenta-that should not cause hypoglycemia CBGs now stable.   Recent Labs    07/22/22 1213 07/23/22 0317 07/23/22 0847  GLUCAP 129* 116* 98     ESRD on HD TTS Nephrology followed closely during this hospitalization-patient had HD last night. Resume usual outpatient HD schedule.  Rest of her medical issues were stable  during the short overnight hospitalization.Marland Kitchen  Discharge Diagnoses:  Principal Problem:   Hypoglycemia Active Problems:   Essential hypertension   ESRD (end stage renal disease) (HCC)   Acute encephalopathy   Hypoglycemia due to insulin   Discharge Instructions:  Activity:  As tolerated with Full fall precautions use walker/cane & assistance as needed  Discharge Instructions     Diet - low sodium heart healthy   Complete by: As directed    Diet Carb Modified   Complete by: As directed    Discharge instructions   Complete by: As directed    Follow with Primary MD  Trey Sailors, PA in 1-2 weeks  Please get a complete blood count and chemistry panel checked by your Primary MD at your next visit, and again as instructed by your Primary MD.  Get Medicines reviewed and adjusted: Please take all your medications with you for your next visit with your Primary MD  Laboratory/radiological data: Please request your Primary MD to go over all hospital tests and procedure/radiological results at the follow up, please ask your Primary MD to get all Hospital records sent to his/her office.  In some cases, they will be blood work, cultures and biopsy results pending at the time of your discharge. Please request that your primary care M.D. follows up on these results.  Also Note the following: If you experience worsening of your admission symptoms, develop shortness of breath, life threatening emergency, suicidal or homicidal thoughts you must  seek medical attention immediately by calling 911 or calling your MD immediately  if symptoms less severe.  You must read complete instructions/literature along with all the possible adverse reactions/side effects for all the Medicines you take and that have been prescribed to you. Take any new Medicines after you have completely understood and accpet all the possible adverse reactions/side effects.   Do not drive when taking Pain medications or  sleeping medications (Benzodaizepines)  Do not take more than prescribed Pain, Sleep and Anxiety Medications. It is not advisable to combine anxiety,sleep and pain medications without talking with your primary care practitioner  Special Instructions: If you have smoked or chewed Tobacco  in the last 2 yrs please stop smoking, stop any regular Alcohol  and or any Recreational drug use.  Wear Seat belts while driving.  Please note: You were cared for by a hospitalist during your hospital stay. Once you are discharged, your primary care physician will handle any further medical issues. Please note that NO REFILLS for any discharge medications will be authorized once you are discharged, as it is imperative that you return to your primary care physician (or establish a relationship with a primary care physician if you do not have one) for your post hospital discharge needs so that they can reassess your need for medications and monitor your lab values.   Increase activity slowly   Complete by: As directed       Allergies as of 07/23/2022       Reactions   Amlodipine Other (See Comments)   Patient has LE edema to this med at higher doses (10 mg)        Medication List     STOP taking these medications    HumuLIN 70/30 KwikPen (70-30) 100 UNIT/ML KwikPen Generic drug: insulin isophane & regular human KwikPen   VITAMIN D PO       TAKE these medications    apixaban 2.5 MG Tabs tablet Commonly known as: ELIQUIS Take 1 tablet (2.5 mg total) by mouth 2 (two) times daily.   linagliptin 5 MG Tabs tablet Commonly known as: TRADJENTA Take 1 tablet (5 mg total) by mouth daily.        Follow-up Information     Trey Sailors, [place]. Schedule an appointment as soon as possible for a visit in 1 week(s).   Specialty: Physician Assistant Contact information: [place] [address] [phone]                Allergies  Allergen Reactions   Amlodipine  Other (See Comments)    Patient has LE edema to this med at higher doses (10 mg)     Other Procedures/Studies: CT Head Wo Contrast  Result Date: 07/22/2022 CLINICAL DATA:  Mental status change, unknown cause. EXAM: CT HEAD WITHOUT CONTRAST TECHNIQUE: Contiguous axial images were obtained from the base of the skull through the vertex without intravenous contrast. RADIATION DOSE REDUCTION: This exam was performed according to the departmental dose-optimization program which includes automated exposure control, adjustment of the mA and/or kV according to patient size and/or use of iterative reconstruction technique. COMPARISON:  Head CT 06/05/2022. FINDINGS: Brain: No acute intracranial hemorrhage. 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 Old left cerebellar infarct. Linear hypoattenuation in the left middle cerebellar peduncle is favored to reflect beam hardening artifact from the skull base. No hydrocephalus or extra-axial collection. No mass or midline shift. Vascular:  No hyperdense vessel or unexpected calcification. Skull: No calvarial fracture or suspicious bone lesion. Skull base is unremarkable. Sinuses/Orbits: Unremarkable. Other: None. IMPRESSION: 1. No acute intracranial hemorrhage. 2. 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 Electronically Signed   By: Emmit Alexanders M.D.   On: 07/22/2022 10:59   DG Chest Port 1 View  Result Date: 07/22/2022 CLINICAL DATA:  Sepsis EXAM: PORTABLE CHEST 1 VIEW COMPARISON:  04/03/2022 x-ray FINDINGS: Enlarged cardiopericardial silhouette with calcified aorta. No consolidation, pneumothorax or effusion. No edema. Overlapping cardiac leads. Large caliber right IJ catheter again noted with tip at the SVC right atrial junction. Degenerative changes of the shoulders. Elevated right humeral head. Please correlate for a rotator cuff tear.  IMPRESSION: Right IJ large-bore catheter. Enlarged cardiopericardial silhouette. No edema Electronically Signed   By: Jill Side M.D.   On: 07/22/2022 10:19   PERIPHERAL VASCULAR CATHETERIZATION  Result Date: 07/07/2022 Images from the original result were not included. Patient name: Quentasia Stmarie MRN: [id number] DOB: 1940-10-10 Sex: female 07/07/2022 Pre-operative Diagnosis: Right arm fistula malfunction Post-operative diagnosis:  Same Surgeon:  Broadus John, MD Procedure Performed: 1.  Ultrasound-guided micropuncture access of the right radial artery in retrograde fashion 2.  Right arm fistulogram 3.  Right cephalic vein drug-coated balloon angioplasty 4 x 40,4 x 40, 4 x 60 mm 4.  Access managed with TR band 5.  Moderate sedation time 48 minutes Indications:  Patient is an 82 year old female who presents today for her right arm fistulogram.  At her last visit in our office in January, low flows were appreciated with poor maturation.  The plan has been for fistulogram, however the patient missed her OR appointment, necessitating rescheduling. On exam, the fistula is lightly pulsatile immediately distal to the anastomosis.  The remainder of the fistula appears decompressed, with no thrill. I had a long conversation regarding fistulogram in an effort to define and improve flow through the fistula so that it can be used for long-term HD access.  There is a high possibility the fistula will not mature at this point, even with balloon assisted maturation. After discussing the risks and benefits Ting elected to proceed. Findings: Greater than 90% stenosis at the brachial artery to cephalic vein anastomosis Greater than 90% stenosis 5 cm from the anastomosis in the cephalic vein Overall, small fistula throughout. Non- Flow-limiting outflow stenosis appreciated at the proximal portion of the cephalic vein. There is a large collateral from the cephalic vein to the internal jugular which drains quickly.  Procedure:  The  patient was identified in the holding area and taken to the cardiac Cath Lab.  She was prepped and draped in standard fashion and a timeout was performed.  Monitored anesthesia was induced with Versed, I was present for the entirety moderate sedation. Ultrasound guided micropuncture needle was used to access the right radial artery.  A 0.014 wire was run into the fistula.  Fistulogram followed.  See results above. I elected to attempt intervention on the anastomotic stenosis, and stenosis appreciated more centrally at the 5 cm mark.  Initially, a 3 x 60 mm balloon was brought onto the field and inflated across both lesions.  This very minor improvement in the anastomotic stenosis but improved the more central stenosis.  I then elected to move to a 4 mm drug-coated balloon for both lesions.  The 4 mm balloon was inflated for 3 minutes, followed by another 4 mm drug-coated balloon the more central lesion.  Another 4 mm balloon  was used in the proximal due to greater than 50% stenosis appreciated completion demonstrating excellent result with mild amount of vasospasm.  There was significant improvement in the lumen at the anastomosis.  Residual stenosis less than 10%.  Residual stenosis more centrally in the cephalic vein with less than 10%. Impression: Successful balloon angioplasty of the right-sided brachiocephalic fistula beginning at the anastomosis and removing centrally to the midhumerus.  Overall, the fistula remains small.  I plan to see her back in 1 month's time to assess for improvement.  Should balloon assisted maturation fail, we discuss abandoning the fistula and creating new access. [name], MD [facility] Office: [phone]     TODAY-DAY OF DISCHARGE:  Subjective:   Zuleima Chatfield today has no headache,no chest abdominal pain,no new weakness tingling or numbness, feels much better wants to go home today.   Objective:   Blood pressure 104/63, pulse 86,  temperature 98.2 F (36.8 C), temperature source Oral, resp. rate 15, height 5\' 5"  (1.651 m), weight 60.8 kg, SpO2 100 %.  Intake/Output Summary (Last 24 hours) at 07/23/2022 1051 Last data filed at 07/23/2022 0800 Gross per 24 hour  Intake 240 ml  Output 1500 ml  Net -1260 ml   Filed Weights   07/22/22 0920  Weight: 60.8 kg    Exam: Awake Alert, Oriented *3, No new F.N deficits, Normal affect [name],PERRAL Supple Neck,No JVD, No cervical lymphadenopathy appriciated.  Symmetrical Chest wall movement, Good air movement bilaterally, CTAB RRR,No Gallops,Rubs or new Murmurs, No Parasternal Heave +ve B.Sounds, Abd Soft, Non tender, No organomegaly appriciated, No rebound -guarding or rigidity. No Cyanosis, Clubbing or edema, No new Rash or bruise   PERTINENT RADIOLOGIC STUDIES: CT Head Wo Contrast  Result Date: 07/22/2022 CLINICAL DATA:  Mental status change, unknown cause. EXAM: CT HEAD WITHOUT CONTRAST TECHNIQUE: Contiguous axial images were obtained from the base of the skull through the vertex without intravenous contrast. RADIATION DOSE REDUCTION: This exam was performed according to the departmental dose-optimization program which includes automated exposure control, adjustment of the mA and/or kV according to patient size and/or use of iterative reconstruction technique. COMPARISON:  Head CT 06/05/2022. FINDINGS: Brain: No acute intracranial hemorrhage. 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 Old left cerebellar infarct. Linear hypoattenuation in the left middle cerebellar peduncle is favored to reflect beam hardening artifact from the skull base. No hydrocephalus or extra-axial collection. No mass or midline shift. Vascular: No hyperdense vessel or unexpected calcification. Skull: No calvarial fracture or suspicious bone lesion. Skull base is unremarkable. Sinuses/Orbits: Unremarkable. Other:  None. IMPRESSION: 1. No acute intracranial hemorrhage. 2. 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 Electronically Signed   By: Emmit Alexanders M.D.   On: 07/22/2022 10:59   DG Chest Port 1 View  Result Date: 07/22/2022 CLINICAL DATA:  Sepsis EXAM: PORTABLE CHEST 1 VIEW COMPARISON:  04/03/2022 x-ray FINDINGS: Enlarged cardiopericardial silhouette with calcified aorta. No consolidation, pneumothorax or effusion. No edema. Overlapping cardiac leads. Large caliber right IJ catheter again noted with tip at the SVC right atrial junction. Degenerative changes of the shoulders. Elevated right humeral head. Please correlate for a rotator cuff tear. IMPRESSION: Right IJ large-bore catheter. Enlarged cardiopericardial silhouette. No edema Electronically Signed   By: Jill Side M.D.   On: 07/22/2022 10:19     PERTINENT LAB RESULTS: CBC: Recent Labs    07/22/22 1000 07/23/22 0641  WBC  5.6 6.5  HGB 10.1* 9.4*  HCT 31.9* 29.6*  PLT 116* 105*   CMET CMP     Component Value Date/Time   NA 133 (L) 07/23/2022 0641   NA 140 05/20/2021 1159   K 3.5 07/23/2022 0641   CL 95 (L) 07/23/2022 0641   CO2 26 07/23/2022 0641   GLUCOSE 77 07/23/2022 0641   BUN 9 07/23/2022 0641   BUN 37 (H) 05/20/2021 1159   CREATININE 1.83 (H) 07/23/2022 0641   CALCIUM 8.7 (L) 07/23/2022 0641   PROT 6.9 07/23/2022 0641   ALBUMIN 3.2 (L) 07/23/2022 0641   AST 18 07/23/2022 0641   ALT 8 07/23/2022 0641   ALKPHOS 73 07/23/2022 0641   BILITOT 0.7 07/23/2022 0641   GFRNONAA 28 (L) 07/23/2022 0641   GFRAA 38 (L) 08/09/2019 1104    GFR Estimated Creatinine Clearance: 22.1 mL/min (A) (by C-G formula based on SCr of 1.83 mg/dL (H)). Recent Labs    07/22/22 1000  LIPASE 25   No results for input(s): "CKTOTAL", "CKMB", "CKMBINDEX", "TROPONINI" in the last 72 hours. Invalid input(s): "POCBNP" No results for input(s): "DDIMER" in the last 72 hours. No results for input(s): "HGBA1C" in the  last 72 hours. No results for input(s): "CHOL", "HDL", "LDLCALC", "TRIG", "CHOLHDL", "LDLDIRECT" in the last 72 hours. Recent Labs    07/23/22 0641  TSH 1.284   No results for input(s): "VITAMINB12", "FOLATE", "FERRITIN", "TIBC", "IRON", "RETICCTPCT" in the last 72 hours. Coags: No results for input(s): "INR" in the last 72 hours.  Invalid input(s): "PT" Microbiology: Recent Results (from the past 240 hour(s))  Resp panel by RT-PCR (RSV, Flu A&B, Covid) Anterior Nasal Swab     Status: None   Collection Time: 07/22/22 10:00 AM   Specimen: Anterior Nasal Swab  Result Value Ref Range Status   SARS Coronavirus 2 by RT PCR NEGATIVE NEGATIVE Final   Influenza A by PCR NEGATIVE NEGATIVE Final   Influenza B by PCR NEGATIVE NEGATIVE Final    Comment: (NOTE) The Xpert Xpress SARS-CoV-2/FLU/RSV plus assay is intended as an aid in the diagnosis of influenza from Nasopharyngeal swab specimens and should not be used as a sole basis for treatment. Nasal washings and aspirates are unacceptable for Xpert Xpress SARS-CoV-2/FLU/RSV testing.  Fact Sheet for Patients: EntrepreneurPulse.com.au  Fact Sheet for Healthcare Providers: IncredibleEmployment.be  This test is not yet approved or cleared by the [place] FDA and has been authorized for detection and/or diagnosis of SARS-CoV-2 by FDA under an Emergency Use Authorization (EUA). This EUA will remain in effect (meaning this test can be used) for the duration of the COVID-19 declaration under Section 564(b)(1) of the Act, 21 U.S.C. section 360bbb-3(b)(1), unless the authorization is terminated or revoked.     Resp Syncytial Virus by PCR NEGATIVE NEGATIVE Final    Comment: (NOTE) Fact Sheet for Patients: EntrepreneurPulse.com.au  Fact Sheet for Healthcare Providers: IncredibleEmployment.be  This test is not yet approved or cleared by the [place] FDA  and has been authorized for detection and/or diagnosis of SARS-CoV-2 by FDA under an Emergency Use Authorization (EUA). This EUA will remain in effect (meaning this test can be used) for the duration of the COVID-19 declaration under Section 564(b)(1) of the Act, 21 U.S.C. section 360bbb-3(b)(1), unless the authorization is terminated or revoked.  Performed at [facility], [place] [address], [place], [place] [address]   Culture, blood (Routine X 2) w Reflex to ID Panel     Status: None (Preliminary  result)   Collection Time: 07/22/22  8:44 PM   Specimen: BLOOD  Result Value Ref Range Status   Specimen Description BLOOD BLOOD LEFT HAND  Final   Special Requests   Final    BOTTLES DRAWN AEROBIC ONLY Blood Culture results may not be optimal due to an inadequate volume of blood received in culture bottles   Culture   Final    NO GROWTH < 12 HOURS Performed at [facility], [place] [address], [place], [place] [address]    Report Status PENDING  Incomplete  Culture, blood (Routine X 2) w Reflex to ID Panel     Status: None (Preliminary result)   Collection Time: 07/22/22  8:44 PM   Specimen: BLOOD  Result Value Ref Range Status   Specimen Description BLOOD BLOOD LEFT HAND  Final   Special Requests   Final    BOTTLES DRAWN AEROBIC ONLY Blood Culture results may not be optimal due to an inadequate volume of blood received in culture bottles   Culture   Final    NO GROWTH < 12 HOURS Performed at [facility], [place] [address], [place], [place] [address]    Report Status PENDING  Incomplete    FURTHER DISCHARGE INSTRUCTIONS:  Get Medicines reviewed and adjusted: Please take all your medications with you for your next visit with your Primary MD  Laboratory/radiological data: Please request your Primary MD to go over all hospital tests and procedure/radiological results at the follow up, please ask your Primary MD to get all Hospital records sent to his/her office.  In  some cases, they will be blood work, cultures and biopsy results pending at the time of your discharge. Please request that your primary care M.D. goes through all the records of your hospital data and follows up on these results.  Also Note the following: If you experience worsening of your admission symptoms, develop shortness of breath, life threatening emergency, suicidal or homicidal thoughts you must seek medical attention immediately by calling 911 or calling your MD immediately  if symptoms less severe.  You must read complete instructions/literature along with all the possible adverse reactions/side effects for all the Medicines you take and that have been prescribed to you. Take any new Medicines after you have completely understood and accpet all the possible adverse reactions/side effects.   Do not drive when taking Pain medications or sleeping medications (Benzodaizepines)  Do not take more than prescribed Pain, Sleep and Anxiety Medications. It is not advisable to combine anxiety,sleep and pain medications without talking with your primary care practitioner  Special Instructions: If you have smoked or chewed Tobacco  in the last 2 yrs please stop smoking, stop any regular Alcohol  and or any Recreational drug use.  Wear Seat belts while driving.  Please note: You were cared for by a hospitalist during your hospital stay. Once you are discharged, your primary care physician will handle any further medical issues. Please note that NO REFILLS for any discharge medications will be authorized once you are discharged, as it is imperative that you return to your primary care physician (or establish a relationship with a primary care physician if you do not have one) for your post hospital discharge needs so that they can reassess your need for medications and monitor your lab values.  Total Time spent coordinating discharge including counseling, education and face to face time equals less  than 30 minutes.  SignedOren Binet 07/23/2022 10:51 AM

## 2022-07-23 NOTE — Progress Notes (Signed)
TRH night cross cover note:   I was notified by RN that patient getting more confused.   Per my chart review, patient admitted yesterday for acute encephalopathy suspected to be on the basis of hypoglycemia and hypotension.  CBG checked at this time noted to be 116, with normotensive blood pressures also noted at this time.  Unclear if patient's worsening confusion is on the basis of sundowning versus another metabolic process.  I have ordered additional laboratory evaluation to further assess, including CMP, CBC, TSH, VBG, ammonia level.  Of note, other vital signs stable at this time, including afebrile.    Babs Bertin, DO Hospitalist

## 2022-07-23 NOTE — Progress Notes (Addendum)
Pt receives out-pt HD at [facility] on TTS. Will assist as needed.   Melven Sartorius Renal Navigator [phone]  Addendum at 11:44 am: D/C order noted. Contacted [facility] to advise clinic of pt's d/c today and that pt should resume care tomorrow.

## 2022-07-24 LAB — HEMOGLOBIN A1C
Hgb A1c MFr Bld: 6.1 % — ABNORMAL HIGH (ref 4.8–5.6)
Mean Plasma Glucose: 128 mg/dL

## 2022-07-25 ENCOUNTER — Telehealth: Payer: Self-pay | Admitting: Nephrology

## 2022-07-25 LAB — BLOOD CULTURE ID PANEL (REFLEXED) - BCID2

## 2022-07-25 LAB — GLUCOSE, CAPILLARY
Glucose-Capillary: 131 mg/dL — ABNORMAL HIGH (ref 70–99)
Glucose-Capillary: 32 mg/dL — CL (ref 70–99)

## 2022-07-25 NOTE — Telephone Encounter (Signed)
Transition of care contact from inpatient facility  Date of Discharge: 07/23/22 Date of Contact: attempted 07/25/22 x2  Method of contact: Phone  Attempted to contact patient to discuss transition of care from inpatient admission. Patient did not answer the phone. Message was left on the patient's voicemail that will contact her at her op kidney center about her recent dc form hospital

## 2022-07-27 LAB — CULTURE, BLOOD (ROUTINE X 2): Culture: NO GROWTH

## 2022-08-05 ENCOUNTER — Other Ambulatory Visit: Payer: Self-pay | Admitting: *Deleted

## 2022-08-05 DIAGNOSIS — N186 End stage renal disease: Secondary | ICD-10-CM

## 2022-08-17 ENCOUNTER — Ambulatory Visit (HOSPITAL_COMMUNITY)
Admission: RE | Admit: 2022-08-17 | Discharge: 2022-08-17 | Disposition: A | Discharge status: Home / Self Care | Payer: Medicare HMO | Source: Ambulatory Visit | Attending: Vascular Surgery | Admitting: Vascular Surgery

## 2022-08-17 ENCOUNTER — Ambulatory Visit (INDEPENDENT_AMBULATORY_CARE_PROVIDER_SITE_OTHER): Payer: Medicare HMO | Admitting: Vascular Surgery

## 2022-08-17 ENCOUNTER — Encounter: Payer: Self-pay | Admitting: Vascular Surgery

## 2022-08-17 ENCOUNTER — Telehealth: Payer: Self-pay

## 2022-08-17 VITALS — BP 112/59 | HR 80 | Temp 97.8°F | Resp 14

## 2022-08-17 DIAGNOSIS — N186 End stage renal disease: Secondary | ICD-10-CM

## 2022-08-17 NOTE — Telephone Encounter (Signed)
Attempted to reach pt's daughter to schedule her surgery. Left voice mail requesting she return our call.

## 2022-08-17 NOTE — Progress Notes (Signed)
[name] MRN: [id number] DOB: Apr 21, 1941 Sex: female  REASON FOR VISIT: F/U after fistulogram  HPI: [name] is a 82 y.o. female with history of end-stage renal disease  on HD Tues/Thurs/Sat that presents for follow-up after fistulogram.  She underwent a right brachiocephalic AV fistula on 01/29/2022 by myself.  This did not mature and she underwent fistulogram for balloon assisted maturation on 07/07/2022 with Dr. Karin Lieu.  There was a 90% stenosis at the anastomosis and 5 cm above antecubitum that was treated with balloon angioplasty by Dr. Karin Lieu.  No complaints today.  Using a right IJ TDC.  Previous left brachiobasilic with Dr. Randie Heinz that did not mature.  Past Medical History:  Diagnosis Date   CHF (congestive heart failure) (HCC)    Chronic kidney disease    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   Diabetes mellitus without complication (HCC)    type 2   Dyspnea    with exertion   Hypertension    Neuromuscular disorder (HCC)    neuropathy bil feet   PAF (paroxysmal atrial fibrillation) (HCC) 08/2020   Pneumonia    Pulmonary hypertension (HCC)     Past Surgical History:  Procedure Laterality Date   A/V FISTULAGRAM Right 07/07/2022   Procedure: A/V Fistulagram;  Surgeon: Victorino Sparrow, MD;  Location: [facility] INVASIVE CV LAB;  Service: Cardiovascular;  Laterality: Right;   A/V SHUNT INTERVENTION N/A 07/07/2022   Procedure: A/V SHUNT INTERVENTION;  Surgeon: Victorino Sparrow, MD;  Location: [facility] INVASIVE CV LAB;  Service: Cardiovascular;  Laterality: N/A;   AV FISTULA PLACEMENT Left 06/18/2021   Procedure: LEFT ARM ARTERIOVENOUS (AV) FISTULA CREATION;  Surgeon: Maeola Harman, MD;  Location: [facility] OR;  Service: Vascular;  Laterality: Left;   AV FISTULA PLACEMENT Right 01/29/2022   Procedure: RIGHT ARM BRACHIOCEPHALIC ARTERIOVENOUS (AV) FISTULA CREATION;  Surgeon: Cephus Shelling, MD;  Location: [facility] OR;  Service: Vascular;  Laterality: Right;   BASCILIC VEIN  TRANSPOSITION Left 08/28/2021   Procedure: LEFT ARM BASILIC VEIN TRANSPOSITION;  Surgeon: Maeola Harman, MD;  Location: [facility] OR;  Service: Vascular;  Laterality: Left;   CARDIOVERSION N/A 01/14/2021   Procedure: CARDIOVERSION;  Surgeon: Yates Decamp, MD;  Location: [facility] ENDOSCOPY;  Service: Cardiovascular;  Laterality: N/A;   IR FLUORO GUIDE CV LINE RIGHT  06/15/2021   IR US GUIDE VASC ACCESS RIGHT  06/15/2021   TONSILLECTOMY      Family History  Family history unknown: Yes    SOCIAL HISTORY: Social History   Tobacco Use   Smoking status: Never    Passive exposure: Never   Smokeless tobacco: Never  Substance Use Topics   Alcohol use: No    Allergies  Allergen Reactions   Amlodipine Other (See Comments)    Patient has LE edema to this med at higher doses (10 mg)    Current Outpatient Medications  Medication Sig Dispense Refill   apixaban (ELIQUIS) 2.5 MG TABS tablet Take 1 tablet (2.5 mg total) by mouth 2 (two) times daily. (Patient not taking: Reported on 07/22/2022) 30 tablet 3   linagliptin (TRADJENTA) 5 MG TABS tablet Take 1 tablet (5 mg total) by mouth daily. 30 tablet 1   No current facility-administered medications for this visit.    REVIEW OF SYSTEMS:   denotes positive finding,  denotes negative finding Cardiac  Comments:  Chest pain or chest pressure:    Shortness of breath upon exertion:    Short of breath when lying flat:  Irregular heart rhythm:        Vascular    Pain in calf, thigh, or hip brought on by ambulation:    Pain in feet at night that wakes you up from your sleep:     Blood clot in your veins:    Leg swelling:         Pulmonary    Oxygen at home:    Productive cough:     Wheezing:         Neurologic    Sudden weakness in arms or legs:     Sudden numbness in arms or legs:     Sudden onset of difficulty speaking or slurred speech:    Temporary loss of vision in one eye:     Problems with dizziness:         Gastrointestinal     Blood in stool:     Vomited blood:         Genitourinary    Burning when urinating:     Blood in urine:        Psychiatric    Major depression:         Hematologic    Bleeding problems:    Problems with blood clotting too easily:        Skin    Rashes or ulcers:        Constitutional    Fever or chills:      PHYSICAL EXAM: There were no vitals filed for this visit.  GENERAL: The patient is a well-nourished female, in no acute distress. The vital signs are documented above. CARDIAC: There is a regular rate and rhythm.  VASCULAR:  Right brachiocephalic AV fistula with pulsatility above antecubital fossa  Right brachial pulse palpable Right radial pulse palpable ABDOMEN: Soft and non-tender. MUSCULOSKELETAL: There are no major deformities or cyanosis. NEUROLOGIC: No focal weakness or paresthesias are detected. SKIN: There are no ulcers or rashes noted. PSYCHIATRIC: The patient has a normal affect.  DATA:   DIALYSIS ACCESS  Patient Name:  [name]  Date of Exam:   08/17/2022 Medical Rec #: [id number]      Accession #:    [id number] Date of Birth: November 11, 1940      Patient Gender: F Patient Age:   8 years Exam Location:  [facility] Procedure:      VAS US DUPLEX DIALYSIS ACCESS (AVF, AVG) Referring Phys: EMMA COLLINS   --------------------------------------------------------------------------- -----   Reason for Exam: Routine follow up.  Access Type: Brachial-cephalic AVF. placed 01/29/2022  History: Right Cephalic vein drug-coated angioplasty 07/07/2022,          Left arm Basilic vein transposition 08/29/2021.  Limitations: Exam performed in [facility]  Performing Technologist: Elita Quick RVT    Examination Guidelines: A complete evaluation includes B-mode imaging, spectral Doppler, color Doppler, and power Doppler as needed of all accessible portions of each vessel. Unilateral testing is considered an integral part of  a complete examination. Limited examinations for reoccurring indications may be performed as noted.    Findings: +--------------------+----------+-----------------+-------------------+ AVF                 PSV (cm/s)Flow Vol (mL/min)     Comments       +--------------------+----------+-----------------+-------------------+ Native artery inflow   150           437                           +--------------------+----------+-----------------+-------------------+  AVF Anastomosis        269                     thrombus visualized +--------------------+----------+-----------------+-------------------+    +------------+----------+-------------+----------+--------+ OUTFLOW VEINPSV (cm/s)Diameter (cm)Depth (cm)Describe +------------+----------+-------------+----------+--------+ Prox UA         94        0.43        0.65            +------------+----------+-------------+----------+--------+ Mid UA          96        0.36        0.22            +------------+----------+-------------+----------+--------+ Dist UA        372        0.34        0.23            +------------+----------+-------------+----------+--------+ AC Fossa       604        0.17        0.15            +------------+----------+-------------+----------+--------+       Summary: Arteriovenous fistula-Velocities less than 100cm/s noted. Arteriovenous graft-Elevated velocities at area of narrowing in the antecubital fossa approximately 0.70 cm in length with thrombus visualized within. .    Assessment/Plan:  82 year old female with end-stage renal disease that presents for hospital follow-up after recent fistulogram on her right brachiocephalic AV fistula that was placed on 01/29/2022.  Fistulogram on 07/07/2022 showed 90% stenosis of the brachiocephalic anastomosis and another 90% stenosis 5 cm above the antecubitum that was treated with balloon angioplasty.  Unfortunately today the  fistula still has pulsatility.  I reviewed her fistula duplex that shows thrombus in the fistula and this is still small measuring around 3 mm with low flow volumes.  I think it is time to abandon the fistula.  Given that there is an anastomotic problem as well, I think we ligate the fistula and then plan either right arm basilic vein fistula or AV graft.  I discussed all this with the patient.  Will get scheduled her convenience at [facility].  Her family states she is off the Eliquis now.   Cephus Shelling, MD [facility] [place] Office: [phone]

## 2022-08-19 NOTE — Telephone Encounter (Signed)
Call placed to reach patient/daughter x 2 attempts. Phone disconnected during conversation with daughter on both attempts.

## 2022-08-23 ENCOUNTER — Other Ambulatory Visit: Payer: Self-pay

## 2022-08-23 DIAGNOSIS — N186 End stage renal disease: Secondary | ICD-10-CM

## 2022-08-23 NOTE — Telephone Encounter (Signed)
Spoke with patient's daughter and scheduled surgery for 4/26. Instructions provided and she verbalized understanding.

## 2022-08-26 NOTE — Progress Notes (Signed)
SDW. Unable to make contact with patient. Left VM with patient and VM with her daughter re: location, arrival time of 0800, NPO status, hygiene. Medication list has not been updated, so unable to verify insulin, tradjenta or eliquis.  Instructed patient if she was taking these to not take them the morning of surgery.     Spoke with Dr Leslye Peer re: SDW and patients history of HTN, CHF, SOB, DM, ESRD with hemo.  Anesthesia to evaluate patient DOS.

## 2022-08-27 ENCOUNTER — Other Ambulatory Visit: Payer: Self-pay

## 2022-08-27 ENCOUNTER — Ambulatory Visit (HOSPITAL_COMMUNITY): Payer: Medicare HMO | Admitting: Certified Registered Nurse Anesthetist

## 2022-08-27 ENCOUNTER — Encounter (HOSPITAL_COMMUNITY): Payer: Self-pay | Admitting: Vascular Surgery

## 2022-08-27 ENCOUNTER — Encounter (HOSPITAL_COMMUNITY)
Admission: RE | Disposition: A | Discharge status: Home / Self Care | Payer: Self-pay | Source: Ambulatory Visit | Attending: Vascular Surgery

## 2022-08-27 ENCOUNTER — Ambulatory Visit (HOSPITAL_BASED_OUTPATIENT_CLINIC_OR_DEPARTMENT_OTHER): Payer: Medicare HMO | Admitting: Certified Registered Nurse Anesthetist

## 2022-08-27 ENCOUNTER — Ambulatory Visit (HOSPITAL_COMMUNITY)
Admission: RE | Admit: 2022-08-27 | Discharge: 2022-08-27 | Disposition: A | Discharge status: Home / Self Care | Payer: Medicare HMO | Source: Ambulatory Visit | Attending: Vascular Surgery | Admitting: Vascular Surgery

## 2022-08-27 DIAGNOSIS — N186 End stage renal disease: Secondary | ICD-10-CM | POA: Insufficient documentation

## 2022-08-27 DIAGNOSIS — Z794 Long term (current) use of insulin: Secondary | ICD-10-CM

## 2022-08-27 DIAGNOSIS — E1122 Type 2 diabetes mellitus with diabetic chronic kidney disease: Secondary | ICD-10-CM | POA: Diagnosis not present

## 2022-08-27 DIAGNOSIS — D631 Anemia in chronic kidney disease: Secondary | ICD-10-CM

## 2022-08-27 DIAGNOSIS — I509 Heart failure, unspecified: Secondary | ICD-10-CM | POA: Diagnosis not present

## 2022-08-27 DIAGNOSIS — N185 Chronic kidney disease, stage 5: Secondary | ICD-10-CM | POA: Diagnosis not present

## 2022-08-27 DIAGNOSIS — I132 Hypertensive heart and chronic kidney disease with heart failure and with stage 5 chronic kidney disease, or end stage renal disease: Secondary | ICD-10-CM | POA: Insufficient documentation

## 2022-08-27 DIAGNOSIS — Z992 Dependence on renal dialysis: Secondary | ICD-10-CM | POA: Insufficient documentation

## 2022-08-27 HISTORY — PX: LIGATION OF ARTERIOVENOUS  FISTULA: SHX5948

## 2022-08-27 HISTORY — PX: AV FISTULA PLACEMENT: SHX1204

## 2022-08-27 LAB — POCT I-STAT, CHEM 8
BUN: 14 mg/dL (ref 8–23)
Calcium, Ion: 0.83 mmol/L — CL (ref 1.15–1.40)
Chloride: 102 mmol/L (ref 98–111)
Creatinine, Ser: 2 mg/dL — ABNORMAL HIGH (ref 0.44–1.00)
Glucose, Bld: 113 mg/dL — ABNORMAL HIGH (ref 70–99)
HCT: 42 % (ref 36.0–46.0)
Hemoglobin: 14.3 g/dL (ref 12.0–15.0)
Potassium: 4.3 mmol/L (ref 3.5–5.1)
Sodium: 134 mmol/L — ABNORMAL LOW (ref 135–145)
TCO2: 31 mmol/L (ref 22–32)

## 2022-08-27 LAB — GLUCOSE, CAPILLARY
Glucose-Capillary: 107 mg/dL — ABNORMAL HIGH (ref 70–99)
Glucose-Capillary: 106 mg/dL — ABNORMAL HIGH (ref 70–99)
Glucose-Capillary: 131 mg/dL — ABNORMAL HIGH (ref 70–99)

## 2022-08-27 SURGERY — ARTERIOVENOUS (AV) FISTULA CREATION
Anesthesia: Monitor Anesthesia Care | Site: Arm Upper | Laterality: Right

## 2022-08-27 MED ORDER — CEFAZOLIN SODIUM-DEXTROSE 2-4 GM/100ML-% IV SOLN
INTRAVENOUS | Status: AC
  Filled 2022-08-27: qty 100

## 2022-08-27 MED ORDER — ALBUMIN HUMAN 5 % IV SOLN: INTRAVENOUS | Status: DC | PRN

## 2022-08-27 MED ORDER — OXYCODONE-ACETAMINOPHEN 5-325 MG PO TABS: 1.0000 | ORAL_TABLET | Freq: Four times a day (QID) | ORAL | 0 refills | Status: DC | PRN

## 2022-08-27 MED ORDER — HEPARIN SODIUM (PORCINE) 1000 UNIT/ML IJ SOLN
INTRAMUSCULAR | Status: DC | PRN
  Administered 2022-08-27: 3000 [IU] via INTRAVENOUS

## 2022-08-27 MED ORDER — LIDOCAINE HCL (PF) 1 % IJ SOLN
INTRAMUSCULAR | Status: AC
  Filled 2022-08-27: qty 30

## 2022-08-27 MED ORDER — SODIUM CHLORIDE 0.9 % IV SOLN: INTRAVENOUS | Status: DC

## 2022-08-27 MED ORDER — INSULIN ASPART 100 UNIT/ML IJ SOLN: 0.0000 [IU] | INTRAMUSCULAR | Status: DC | PRN

## 2022-08-27 MED ORDER — HEPARIN 6000 UNIT IRRIGATION SOLUTION
Status: AC
  Filled 2022-08-27: qty 500

## 2022-08-27 MED ORDER — FENTANYL CITRATE (PF) 250 MCG/5ML IJ SOLN
INTRAMUSCULAR | Status: AC
  Filled 2022-08-27: qty 5

## 2022-08-27 MED ORDER — PROPOFOL 500 MG/50ML IV EMUL
INTRAVENOUS | Status: DC | PRN
  Administered 2022-08-27: 100 ug/kg/min via INTRAVENOUS

## 2022-08-27 MED ORDER — 0.9 % SODIUM CHLORIDE (POUR BTL) OPTIME
TOPICAL | Status: DC | PRN
  Administered 2022-08-27: 1000 mL

## 2022-08-27 MED ORDER — FENTANYL CITRATE (PF) 100 MCG/2ML IJ SOLN: 25.0000 ug | INTRAMUSCULAR | Status: DC | PRN

## 2022-08-27 MED ORDER — FENTANYL CITRATE (PF) 100 MCG/2ML IJ SOLN
INTRAMUSCULAR | Status: AC
  Filled 2022-08-27: qty 2

## 2022-08-27 MED ORDER — CHLORHEXIDINE GLUCONATE 0.12 % MT SOLN: 15.0000 mL | Freq: Once | OROMUCOSAL | Status: AC

## 2022-08-27 MED ORDER — CHLORHEXIDINE GLUCONATE 4 % EX SOLN: 60.0000 mL | Freq: Once | CUTANEOUS | Status: DC

## 2022-08-27 MED ORDER — CHLORHEXIDINE GLUCONATE 0.12 % MT SOLN
OROMUCOSAL | Status: AC
  Administered 2022-08-27: 15 mL via OROMUCOSAL
  Filled 2022-08-27: qty 15

## 2022-08-27 MED ORDER — LIDOCAINE 2% (20 MG/ML) 5 ML SYRINGE
INTRAMUSCULAR | Status: DC | PRN
  Administered 2022-08-27: 40 mg via INTRAVENOUS

## 2022-08-27 MED ORDER — ONDANSETRON HCL 4 MG/2ML IJ SOLN
INTRAMUSCULAR | Status: DC | PRN
  Administered 2022-08-27: 4 mg via INTRAVENOUS

## 2022-08-27 MED ORDER — ORAL CARE MOUTH RINSE: 15.0000 mL | Freq: Once | OROMUCOSAL | Status: AC

## 2022-08-27 MED ORDER — PHENYLEPHRINE HCL-NACL 20-0.9 MG/250ML-% IV SOLN
INTRAVENOUS | Status: DC | PRN
  Administered 2022-08-27: 50 ug/min via INTRAVENOUS

## 2022-08-27 MED ORDER — HEPARIN 6000 UNIT IRRIGATION SOLUTION
Status: DC | PRN
  Administered 2022-08-27: 1

## 2022-08-27 MED ORDER — CALCIUM CHLORIDE 10 % IV SOLN
INTRAVENOUS | Status: DC | PRN
  Administered 2022-08-27 (×2): 200 mg via INTRAVENOUS
  Administered 2022-08-27: 500 mg via INTRAVENOUS
  Administered 2022-08-27: 100 mg via INTRAVENOUS

## 2022-08-27 MED ORDER — FENTANYL CITRATE (PF) 250 MCG/5ML IJ SOLN
INTRAMUSCULAR | Status: DC | PRN
  Administered 2022-08-27: 50 ug via INTRAVENOUS

## 2022-08-27 MED ORDER — CEFAZOLIN SODIUM-DEXTROSE 2-4 GM/100ML-% IV SOLN
2.0000 g | INTRAVENOUS | Status: AC
  Administered 2022-08-27: 2 g via INTRAVENOUS

## 2022-08-27 SURGICAL SUPPLY — 40 items
ADH SKN CLS APL DERMABOND .7 (GAUZE/BANDAGES/DRESSINGS) ×1
AGENT HMST SPONGE THK3/8 (HEMOSTASIS)
ARMBAND PINK RESTRICT EXTREMIT (MISCELLANEOUS) ×2 IMPLANT
BAG COUNTER SPONGE SURGICOUNT (BAG) ×1 IMPLANT
BAG SPNG CNTER NS LX DISP (BAG) ×1
BLADE CLIPPER SURG (BLADE) ×1 IMPLANT
CANISTER SUCT 3000ML PPV (MISCELLANEOUS) ×1 IMPLANT
CLIP TI MEDIUM 6 (CLIP) ×1 IMPLANT
CLIP TI WIDE RED SMALL 6 (CLIP) ×1 IMPLANT
COVER PROBE W GEL 5X96 (DRAPES) ×1 IMPLANT
DERMABOND ADVANCED .7 DNX12 (GAUZE/BANDAGES/DRESSINGS) ×1 IMPLANT
DRAPE SURG 17X23 STRL (DRAPES) IMPLANT
ELECT REM PT RETURN 9FT ADLT (ELECTROSURGICAL) ×1
ELECTRODE REM PT RTRN 9FT ADLT (ELECTROSURGICAL) ×1 IMPLANT
GAUZE SPONGE 4X4 12PLY STRL (GAUZE/BANDAGES/DRESSINGS) ×1 IMPLANT
GLOVE BIO SURGEON STRL SZ7.5 (GLOVE) ×1 IMPLANT
GLOVE BIOGEL PI IND STRL 8 (GLOVE) ×1 IMPLANT
GOWN STRL REUS W/ TWL LRG LVL3 (GOWN DISPOSABLE) ×2 IMPLANT
GOWN STRL REUS W/ TWL XL LVL3 (GOWN DISPOSABLE) ×2 IMPLANT
GOWN STRL REUS W/TWL LRG LVL3 (GOWN DISPOSABLE) ×2
GOWN STRL REUS W/TWL XL LVL3 (GOWN DISPOSABLE) ×2
HEMOSTAT SPONGE AVITENE ULTRA (HEMOSTASIS) IMPLANT
KIT BASIN OR (CUSTOM PROCEDURE TRAY) ×1 IMPLANT
KIT TURNOVER KIT B (KITS) ×1 IMPLANT
NS IRRIG 1000ML POUR BTL (IV SOLUTION) ×1 IMPLANT
PACK CV ACCESS (CUSTOM PROCEDURE TRAY) ×1 IMPLANT
PAD ARMBOARD 7.5X6 YLW CONV (MISCELLANEOUS) ×2 IMPLANT
SLING ARM FOAM STRAP LRG (SOFTGOODS) IMPLANT
SLING ARM FOAM STRAP MED (SOFTGOODS) IMPLANT
SPIKE FLUID TRANSFER (MISCELLANEOUS) ×1 IMPLANT
SUT ETHILON 3 0 PS 1 (SUTURE) IMPLANT
SUT MNCRL AB 4-0 PS2 18 (SUTURE) ×2 IMPLANT
SUT PROLENE 6 0 BV (SUTURE) ×1 IMPLANT
SUT PROLENE 7 0 BV 1 (SUTURE) IMPLANT
SUT SILK 0 TIES 10X30 (SUTURE) ×1 IMPLANT
SUT VIC AB 3-0 SH 27 (SUTURE) ×1
SUT VIC AB 3-0 SH 27X BRD (SUTURE) ×1 IMPLANT
TOWEL GREEN STERILE (TOWEL DISPOSABLE) ×1 IMPLANT
UNDERPAD 30X36 HEAVY ABSORB (UNDERPADS AND DIAPERS) ×1 IMPLANT
WATER STERILE IRR 1000ML POUR (IV SOLUTION) ×1 IMPLANT

## 2022-08-27 NOTE — Progress Notes (Signed)
Peripheral nerve block done with CRNA and anesthesiologist.

## 2022-08-27 NOTE — Op Note (Signed)
OPERATIVE NOTE  DATE: August 27, 2022  PROCEDURE: Ligation of right brachiocephalic AV fistula Right first stage basilic vein transposition (brachiobasilic arteriovenous fistula) placement  PRE-OPERATIVE DIAGNOSIS: End stage renal disease with failure of right brachiocephalic AV fistula to mature  POST-OPERATIVE DIAGNOSIS: same  SURGEON: Cephus Shelling, MD  ASSISTANT(S): Nathanial Rancher, PA  ANESTHESIA: regional  ESTIMATED BLOOD LOSS: <50 mL   FINDING(S): The right brachiocephalic AV fistula was ligated adjacent to the anastomosis given this has failed to mature.  I then made a new right arm fistula with the basilic vein at the antecubitum.  Palpable radial pulse with brisk thrill in the fistula at completion.  SPECIMEN(S):  none  INDICATIONS:   [name] is a 82 y.o. female who presents with end-stage renal disease and failure of her right brachiocephalic AV fistula to mature.  The patient is scheduled for ligation of her right brachiocephalic AV fistula and creation of a new fistula likely with the basilic vein.  DESCRIPTION: After full informed written consent was obtained from the patient, the patient was brought back to the operating room and placed supine upon the operating table.  Prior to induction, the patient received IV antibiotics.   After obtaining adequate anesthesia, the patient was then prepped and draped in the standard fashion for a right arm access procedure.  I initially evaluated the basilic vein with ultrasound and this was of usable caliber above the elbow.  I made a transverse incision at the level of the antecubitum and dissected through the subcutaneous tissue and fascia to gain exposure of the brachial artery.  This was noted to be 5 mm in diameter externally.  This was dissected out proximally and distally and controlled with vessel loops .  I then dissected out the basilic vein.  This was noted to be 3 mm in diameter externally.  The distal  segment of the vein was ligated with a  2-0 silk, and the vein was transected.  The proximal segment was interrogated with serial dilators.  The vein accepted up to a 4 mm dilator without any difficulty.  Through the same incision I was able to visualize the cephalic vein and I used a right angle clamp to get around the brachiocephalic fistula and the cephalic vein was ligated with multiple 2-0 silk ties.  Patient was then given 3000 units of IV heparin.  At this point, I reset my exposure of the brachial artery and placed the artery under tension proximally and distally.  I made an arteriotomy with a #11 blade, and then I extended the arteriotomy with a Potts scissor.  I injected heparinized saline proximal and distal to this arteriotomy.  The vein was then sewn to the artery in an end-to-side configuration with a running stitch of 6-0 Prolene.  Prior to completing this anastomosis, I allowed the vein and artery to backbleed.  There was no evidence of clot from any vessels.  I completed the anastomosis in the usual fashion and then released all vessel loops and clamps.    There was a palpable thrill in the venous outflow, and there was a palpable radial pulse.  At this point, I irrigated out the surgical wound.  There was no further active bleeding.  The subcutaneous tissue was reapproximated with a running stitch of 3-0 Vicryl.  The skin was then reapproximated with a running subcuticular stitch of 4-0 Monocryl.  The skin was then cleaned, dried, and reinforced with Dermabond.  The patient tolerated this procedure  well.   COMPLICATIONS: None  CONDITION: Stable  Cephus Shelling MD [facility] Office: [phone]  Cephus Shelling   08/27/2022, 12:09 PM

## 2022-08-27 NOTE — Discharge Instructions (Signed)
[facility]  Discharge Instructions  AV Fistula or Graft Surgery for Dialysis Access  Please refer to the following instructions for your post-procedure care. Your surgeon or physician assistant will discuss any changes with you.  Activity  You may drive the day following your surgery, if you are comfortable and no longer taking prescription pain medication. Resume full activity as the soreness in your incision resolves.  Bathing/Showering  You may shower after you go home. Keep your incision dry for 48 hours. Do not soak in a bathtub, hot tub, or swim until the incision heals completely. You may not shower if you have a hemodialysis catheter.  Incision Care  Clean your incision with mild soap and water after 48 hours. Pat the area dry with a clean towel. You do not need a bandage unless otherwise instructed. Do not apply any ointments or creams to your incision. You may have skin glue on your incision. Do not peel it off. It will come off on its own in about one week. Your arm may swell a bit after surgery. To reduce swelling use pillows to elevate your arm so it is above your heart. Your doctor will tell you if you need to lightly wrap your arm with an ACE bandage.  Diet  Resume your normal diet. There are not special food restrictions following this procedure. In order to heal from your surgery, it is CRITICAL to get adequate nutrition. Your body requires vitamins, minerals, and protein. Vegetables are the best source of vitamins and minerals. Vegetables also provide the perfect balance of protein. Processed food has little nutritional value, so try to avoid this.  Medications  Resume taking all of your medications. If your incision is causing pain, you may take over-the counter pain relievers such as acetaminophen (Tylenol). If you were prescribed a stronger pain medication, please be aware these medications can cause nausea and constipation. Prevent  nausea by taking the medication with a snack or meal. Avoid constipation by drinking plenty of fluids and eating foods with high amount of fiber, such as fruits, vegetables, and grains.  Do not take Tylenol if you are taking prescription pain medications.  Follow up Your surgeon may want to see you in the office following your access surgery. If so, this will be arranged at the time of your surgery.  Please call us immediately for any of the following conditions:  Increased pain, redness, drainage (pus) from your incision site Fever of 101 degrees or higher Severe or worsening pain at your incision site Hand pain or numbness.  Reduce your risk of vascular disease:  Stop smoking. If you would like help, call [facility] at 1-800-QUIT-NOW ((740)033-8418) or [facility] at (609)473-9894  Manage your cholesterol Maintain a desired weight Control your diabetes Keep your blood pressure down  Dialysis  It will take several weeks to several months for your new dialysis access to be ready for use. Your surgeon will determine when it is okay to use it. Your nephrologist will continue to direct your dialysis. You can continue to use your Permcath until your new access is ready for use.   08/27/2022 [name] [id number] 1941-01-08  Surgeon(s): Cephus Shelling, MD  Procedure(s): RIGHT ARM FIRST STAGE BASILIC ARTERIOVENOUS (AV) FISTULA CREATION LIGATE EXISTING RIGHT ARM BRACHIOCEPHALIC ARTERIOVENOUS FISTULA   May stick graft immediately   May stick graft on designated area only:   X Do not stick right AV fistula  for 12 weeks  If you have any questions, please call the office at [phone].

## 2022-08-27 NOTE — Transfer of Care (Signed)
Immediate Anesthesia Transfer of Care Note  Patient: [name]  Procedure(s) Performed: RIGHT ARM FIRST STAGE BASILIC ARTERIOVENOUS (AV) FISTULA CREATION (Right: Arm Upper) LIGATE EXISTING RIGHT ARM BRACHIOCEPHALIC ARTERIOVENOUS FISTULA (Right: Arm Upper)  Patient Location: PACU  Anesthesia Type:MAC and Regional  Level of Consciousness: drowsy  Airway & Oxygen Therapy: Patient Spontanous Breathing and Patient connected to nasal cannula oxygen  Post-op Assessment: Report given to RN and Post -op Vital signs reviewed and stable  Post vital signs: Reviewed and stable  Last Vitals:  Vitals Value Taken Time  BP 88/44 08/27/22 1222  Temp    Pulse 77 08/27/22 1225  Resp 11 08/27/22 1225  SpO2 100 % 08/27/22 1225  Vitals shown include unvalidated device data.  Last Pain:  Vitals:   08/27/22 0821  TempSrc:   PainSc: 10-Worst pain ever         Complications: No notable events documented.

## 2022-08-27 NOTE — Anesthesia Preprocedure Evaluation (Signed)
Anesthesia Evaluation  Patient identified by MRN, date of birth, ID band Patient awake    Reviewed: Allergy & Precautions, NPO status , Patient's Chart, lab work & pertinent test results  History of Anesthesia Complications Negative for: history of anesthetic complications  Airway Mallampati: I  TM Distance: >3 FB Neck ROM: Full    Dental  (+) Edentulous Upper, Edentulous Lower, Dental Advisory Given   Pulmonary shortness of breath   breath sounds clear to auscultation       Cardiovascular hypertension, Pt. on medications +CHF   Rhythm:Regular  Echocardiogram 12/26/2020:  Normal LV systolic function with visual EF 60-65%. Left ventricle cavity  is normal in size. Mild left ventricular hypertrophy. D-shaped septum in  systole and diastole suggestive of RV pressure and volume overload.    Normal global wall motion. Doppler evidence of grade III (restrictive)  diastolic dysfunction, elevated LAP.  Left atrial cavity is mildly dilated. Atrial septum bows from right to  left, suggestive of elevated RAP.  Right atrial cavity is visually dilated.  Right ventricle cavity is visually dilated and normal function.  Mild (Grade I) aortic regurgitation.  Mild mitral valve annulus calcification. Moderate (Grade III) mitral  regurgitation.  Severe tricuspid regurgitation. Moderate pulmonary hypertension. RVSP  measures 51 mmHg (may be unappreciated given RAE and dilated IVC leading  to equalization of pressures).  Moderate pulmonic regurgitation.  IVC is dilated with a respiratory response of <50%.  Compared to study 06/19/2020 G2DD is now G3DD,TR and PHTN remains stable  otherwise no significant change.    Neuro/Psych Seizures -,   Neuromuscular disease CVA    GI/Hepatic negative GI ROS, Neg liver ROS,,,  Endo/Other  diabetes  Lab Results      Component                Value               Date                      HGBA1C                    6.1 (H)             07/23/2022                Renal/GU ESRF and DialysisRenal diseaseLab Results      Component                Value               Date                      CREATININE               2.00 (H)            08/27/2022            Lab Results      Component                Value               Date                      K                        4.3  08/27/2022                Musculoskeletal negative musculoskeletal ROS (+)    Abdominal   Peds  Hematology negative hematology ROS (+) Lab Results      Component                Value               Date                      WBC                      6.5                 07/23/2022                HGB                      14.3                08/27/2022                HCT                      42.0                08/27/2022                MCV                      96.1                07/23/2022                PLT                      105 (L)             07/23/2022              Anesthesia Other Findings   Reproductive/Obstetrics                                                               Anesthesia Evaluation  Patient identified by MRN, date of birth, ID band Patient confused    Reviewed: NPO status , Patient's Chart, lab work & pertinent test results  Airway Mallampati: II  TM Distance: >3 FB Neck ROM: Full    Dental  (+) Upper Dentures, Lower Dentures   Pulmonary    Pulmonary exam normal        Cardiovascular hypertension, Pt. on medications +CHF  + dysrhythmias Atrial Fibrillation  Rhythm:Regular Rate:Normal     Neuro/Psych Dementia    GI/Hepatic negative GI ROS, Neg liver ROS,   Endo/Other  diabetes, Type 2, Insulin Dependent  Renal/GU ESRF and DialysisRenal disease  negative genitourinary   Musculoskeletal negative musculoskeletal ROS (+)   Abdominal Normal abdominal exam  (+)   Peds  Hematology  (+) Blood dyscrasia, anemia ,    Anesthesia Other Findings   Reproductive/Obstetrics  Anesthesia Physical Anesthesia Plan  ASA: 3  Anesthesia Plan: MAC and Regional   Post-op Pain Management: Regional block*   Induction: Intravenous  PONV Risk Score and Plan: 2 and Ondansetron, Dexamethasone, Propofol infusion and Treatment may vary due to age or medical condition  Airway Management Planned: Simple Face Mask, Natural Airway and Nasal Cannula  Additional Equipment: None  Intra-op Plan:   Post-operative Plan:   Informed Consent: I have reviewed the patients History and Physical, chart, labs and discussed the procedure including the risks, benefits and alternatives for the proposed anesthesia with the patient or authorized representative who has indicated his/her understanding and acceptance.     Dental advisory given  Plan Discussed with: CRNA  Anesthesia Plan Comments: (PAT note by Antionette Poles, PA-C: Follows with cardiology for history of moderate pulmonary hypertension, DOE, HFpEF (grade 3 DD on echo 12/2020), paroxysmal atrial fibrillation, moderate MR, severe TR. Admitted 08/2020 with influenza and found to have new onset atrial fibrillation.  She underwent successful direct-current cardioversion 09/13/2020.  When last seen for cardiology follow-up 05/12/2021, tentative plan at that time was to resume Eliquis due to CHA2DS2-VASc score of 5 (had evidently been stopped inadvertently by patient), however it does not appear this ever happened.  Patient was admitted February 2023 for worsening kidney function bilateral lower extremity swelling.  During admission she was started on dialysis via tunneled catheter placed on 2/13.  She underwent left arm brachial artery to basilic vein AV fistula creation on 06/18/2021.  She was discharged with dialysis on Tuesday Thursday Saturday schedule.  Uncontrolled IDDM 2, last A1c 9.6 on 04/25/2021.  Patient will need day of  surgery labs and evaluation.  EKG 06/12/2021: Wide QRS rhythm.  Rate 81.  Right bundle branch block.  Septal infarct, age undetermined.  No significant change from prior.  Echocardiogram 12/26/2020:  Normal LV systolic function with visual EF 60-65%. Left ventricle cavity  is normal in size. Mild left ventricular hypertrophy. D-shaped septum in  systole and diastole suggestive of RV pressure and volume overload.    Normal global wall motion. Doppler evidence of grade III (restrictive)  diastolic dysfunction, elevated LAP.  Left atrial cavity is mildly dilated. Atrial septum bows from right to  left, suggestive of elevated RAP.  Right atrial cavity is visually dilated.  Right ventricle cavity is visually dilated and normal function.  Mild (Grade I) aortic regurgitation.  Mild mitral valve annulus calcification. Moderate (Grade III) mitral  regurgitation.  Severe tricuspid regurgitation. Moderate pulmonary hypertension. RVSP  measures 51 mmHg (may be unappreciated given RAE and dilated IVC leading  to equalization of pressures).  Moderate pulmonic regurgitation.  IVC is dilated with a respiratory response of <50%.  Compared to study 06/19/2020 G2DD is now G3DD,TR and PHTN remains stable  otherwise no significant change.   )       Anesthesia Quick Evaluation  Anesthesia Physical Anesthesia Plan  ASA: 3  Anesthesia Plan: MAC and Regional   Post-op Pain Management: Celebrex PO (pre-op)*   Induction:   PONV Risk Score and Plan: 2 and Propofol infusion and Treatment may vary due to age or medical condition  Airway Management Planned: Natural Airway and Simple Face Mask  Additional Equipment: None  Intra-op Plan:   Post-operative Plan: Extubation in OR  Informed Consent: I have reviewed the patients History and Physical, chart, labs and discussed the procedure including the risks, benefits and alternatives for the proposed anesthesia with the patient or authorized  representative who has indicated his/her  understanding and acceptance.     Dental advisory given  Plan Discussed with: Anesthesiologist and CRNA  Anesthesia Plan Comments: (PAT note written 01/28/2022 by Shonna Chock, PA-C. )         Anesthesia Quick Evaluation

## 2022-08-27 NOTE — H&P (Signed)
History and Physical Interval Note:  08/27/2022 9:49 AM  [name]  has presented today for surgery, with the diagnosis of ESRD.  The various methods of treatment have been discussed with the patient and family. After consideration of risks, benefits and other options for treatment, the patient has consented to  Procedure(s): RIGHT ARM ARTERIOVENOUS (AV) FISTULA CREATION (Right) LIGATE EXISTING RIGHT ARM ARTERIOVENOUS  FISTULA (Right) as a surgical intervention.  The patient's history has been reviewed, patient examined, no change in status, stable for surgery.  I have reviewed the patient's chart and labs.  Questions were answered to the patient's satisfaction.    Discussed ligating right arm brachiocephalic AVF that has not matured and making new right arm AVF with basilic vein vs graft.  Cephus Shelling     [name]            MRN: [id number]        DOB: Jan 20, 1941          Sex: female   REASON FOR VISIT: F/U after fistulogram   HPI: [name] is a 82 y.o. female with history of end-stage renal disease  on HD Tues/Thurs/Sat that presents for follow-up after fistulogram.  She underwent a right brachiocephalic AV fistula on 01/29/2022 by myself.  This did not mature and she underwent fistulogram for balloon assisted maturation on 07/07/2022 with Dr. Karin Lieu.  There was a 90% stenosis at the anastomosis and 5 cm above antecubitum that was treated with balloon angioplasty by Dr. Karin Lieu.  No complaints today.  Using a right IJ TDC.   Previous left brachiobasilic with Dr. Randie Heinz that did not mature.       Past Medical History:  Diagnosis Date   CHF (congestive heart failure) (HCC)     Chronic kidney disease      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   Diabetes mellitus without complication (HCC)      type 2   Dyspnea      with exertion   Hypertension     Neuromuscular disorder (HCC)      neuropathy bil feet   PAF (paroxysmal atrial fibrillation) (HCC) 08/2020    Pneumonia     Pulmonary hypertension (HCC)             Past Surgical History:  Procedure Laterality Date   A/V FISTULAGRAM Right 07/07/2022    Procedure: A/V Fistulagram;  Surgeon: Victorino Sparrow, MD;  Location: [facility] INVASIVE CV LAB;  Service: Cardiovascular;  Laterality: Right;   A/V SHUNT INTERVENTION N/A 07/07/2022    Procedure: A/V SHUNT INTERVENTION;  Surgeon: Victorino Sparrow, MD;  Location: [facility] INVASIVE CV LAB;  Service: Cardiovascular;  Laterality: N/A;   AV FISTULA PLACEMENT Left 06/18/2021    Procedure: LEFT ARM ARTERIOVENOUS (AV) FISTULA CREATION;  Surgeon: Maeola Harman, MD;  Location: [facility] OR;  Service: Vascular;  Laterality: Left;   AV FISTULA PLACEMENT Right 01/29/2022    Procedure: RIGHT ARM BRACHIOCEPHALIC ARTERIOVENOUS (AV) FISTULA CREATION;  Surgeon: Cephus Shelling, MD;  Location: [facility] OR;  Service: Vascular;  Laterality: Right;   BASCILIC VEIN TRANSPOSITION Left 08/28/2021    Procedure: LEFT ARM BASILIC VEIN TRANSPOSITION;  Surgeon: Maeola Harman, MD;  Location: [facility] OR;  Service: Vascular;  Laterality: Left;   CARDIOVERSION N/A 01/14/2021    Procedure: CARDIOVERSION;  Surgeon: Yates Decamp, MD;  Location: [facility] ENDOSCOPY;  Service: Cardiovascular;  Laterality: N/A;   IR FLUORO GUIDE CV LINE RIGHT   06/15/2021   IR US  GUIDE VASC ACCESS RIGHT   06/15/2021   TONSILLECTOMY          Family History  Family history unknown: Yes      SOCIAL HISTORY: Social History         Tobacco Use   Smoking status: Never      Passive exposure: Never   Smokeless tobacco: Never  Substance Use Topics   Alcohol use: No           Allergies  Allergen Reactions   Amlodipine Other (See Comments)      Patient has LE edema to this med at higher doses (10 mg)            Current Outpatient Medications  Medication Sig Dispense Refill   apixaban (ELIQUIS) 2.5 MG TABS tablet Take 1 tablet (2.5 mg total) by mouth 2 (two) times daily. (Patient not taking: Reported on  07/22/2022) 30 tablet 3   linagliptin (TRADJENTA) 5 MG TABS tablet Take 1 tablet (5 mg total) by mouth daily. 30 tablet 1    No current facility-administered medications for this visit.      REVIEW OF SYSTEMS:  [X]  denotes positive finding, [ ]  denotes negative finding Cardiac   Comments:  Chest pain or chest pressure:      Shortness of breath upon exertion:      Short of breath when lying flat:      Irregular heart rhythm:             Vascular      Pain in calf, thigh, or hip brought on by ambulation:      Pain in feet at night that wakes you up from your sleep:       Blood clot in your veins:      Leg swelling:              Pulmonary      Oxygen at home:      Productive cough:       Wheezing:              Neurologic      Sudden weakness in arms or legs:       Sudden numbness in arms or legs:       Sudden onset of difficulty speaking or slurred speech:      Temporary loss of vision in one eye:       Problems with dizziness:              Gastrointestinal      Blood in stool:       Vomited blood:              Genitourinary      Burning when urinating:       Blood in urine:             Psychiatric      Major depression:              Hematologic      Bleeding problems:      Problems with blood clotting too easily:             Skin      Rashes or ulcers:             Constitutional      Fever or chills:          PHYSICAL EXAM: There were no vitals filed for this visit.   GENERAL: The patient is a well-nourished female, in  no acute distress. The vital signs are documented above. CARDIAC: There is a regular rate and rhythm.  VASCULAR:  Right brachiocephalic AV fistula with pulsatility above antecubital fossa  Right brachial pulse palpable Right radial pulse palpable ABDOMEN: Soft and non-tender. MUSCULOSKELETAL: There are no major deformities or cyanosis. NEUROLOGIC: No focal weakness or paresthesias are detected. SKIN: There are no ulcers or rashes  noted. PSYCHIATRIC: The patient has a normal affect.   DATA:    DIALYSIS ACCESS  Patient Name:  [name]  Date of Exam:   08/17/2022 Medical Rec #: [id number]      Accession #:    [id number] Date of Birth: Nov 12, 1940      Patient Gender: F Patient Age:   25 years Exam Location:  [facility] Procedure:      VAS US DUPLEX DIALYSIS ACCESS (AVF, AVG) Referring Phys: EMMA COLLINS   --------------------------------------------------------------------------- -----   Reason for Exam: Routine follow up.  Access Type: Brachial-cephalic AVF. placed 01/29/2022  History: Right Cephalic vein drug-coated angioplasty 07/07/2022,          Left arm Basilic vein transposition 08/29/2021.  Limitations: Exam performed in [facility]  Performing Technologist: Elita Quick RVT    Examination Guidelines: A complete evaluation includes B-mode imaging, spectral Doppler, color Doppler, and power Doppler as needed of all accessible portions of each vessel. Unilateral testing is considered an integral part of a complete examination. Limited examinations for reoccurring indications may be performed as noted.    Findings: +--------------------+----------+-----------------+-------------------+ AVF                 PSV (cm/s)Flow Vol (mL/min)     Comments       +--------------------+----------+-----------------+-------------------+ Native artery inflow   150           437                           +--------------------+----------+-----------------+-------------------+ AVF Anastomosis        269                     thrombus visualized +--------------------+----------+-----------------+-------------------+    +------------+----------+-------------+----------+--------+ OUTFLOW VEINPSV (cm/s)Diameter (cm)Depth (cm)Describe +------------+----------+-------------+----------+--------+ Prox UA         94        0.43        0.65             +------------+----------+-------------+----------+--------+ Mid UA          96        0.36        0.22            +------------+----------+-------------+----------+--------+ Dist UA        372        0.34        0.23            +------------+----------+-------------+----------+--------+ AC Fossa       604        0.17        0.15            +------------+----------+-------------+----------+--------+       Summary: Arteriovenous fistula-Velocities less than 100cm/s noted. Arteriovenous graft-Elevated velocities at area of narrowing in the antecubital fossa approximately 0.70 cm in length with thrombus visualized within. .      Assessment/Plan:   82 year old female with end-stage renal disease that presents for hospital follow-up after recent fistulogram on her right brachiocephalic AV fistula that was placed on  01/29/2022.  Fistulogram on 07/07/2022 showed 90% stenosis of the brachiocephalic anastomosis and another 90% stenosis 5 cm above the antecubitum that was treated with balloon angioplasty.  Unfortunately today the fistula still has pulsatility.  I reviewed her fistula duplex that shows thrombus in the fistula and this is still small measuring around 3 mm with low flow volumes.  I think it is time to abandon the fistula.  Given that there is an anastomotic problem as well, I think we ligate the fistula and then plan either right arm basilic vein fistula or AV graft.  I discussed all this with the patient.  Will get scheduled her convenience at [facility].  Her family states she is off the Eliquis now.     Cephus Shelling, MD [facility] Office: [phone]

## 2022-08-28 ENCOUNTER — Encounter (HOSPITAL_COMMUNITY): Payer: Self-pay | Admitting: Vascular Surgery

## 2022-08-30 ENCOUNTER — Encounter (HOSPITAL_COMMUNITY): Payer: Self-pay | Admitting: Vascular Surgery

## 2022-08-30 NOTE — Anesthesia Postprocedure Evaluation (Signed)
Anesthesia Post Note  Patient: [name]  Procedure(s) Performed: RIGHT ARM FIRST STAGE BASILIC ARTERIOVENOUS (AV) FISTULA CREATION (Right: Arm Upper) LIGATE EXISTING RIGHT ARM BRACHIOCEPHALIC ARTERIOVENOUS FISTULA (Right: Arm Upper)     Patient location during evaluation: PACU Anesthesia Type: Regional Level of consciousness: awake and alert Pain management: pain level controlled Vital Signs Assessment: post-procedure vital signs reviewed and stable Respiratory status: spontaneous breathing, nonlabored ventilation and respiratory function stable Cardiovascular status: stable and blood pressure returned to baseline Postop Assessment: no apparent nausea or vomiting Anesthetic complications: no   No notable events documented.  Last Vitals:  Vitals:   08/27/22 1300 08/27/22 1315  BP: (!) 82/45 (!) 89/68  Pulse: 82 79  Resp: 14 17  Temp:    SpO2: 97% 96%    Last Pain:  Vitals:   08/27/22 1315  TempSrc:   PainSc: 0-No pain                 Sallyann Kinnaird

## 2022-10-01 ENCOUNTER — Other Ambulatory Visit: Payer: Self-pay | Admitting: *Deleted

## 2022-10-01 DIAGNOSIS — N186 End stage renal disease: Secondary | ICD-10-CM

## 2022-10-11 ENCOUNTER — Encounter: Payer: Self-pay | Admitting: Physician Assistant

## 2022-10-11 ENCOUNTER — Ambulatory Visit (HOSPITAL_COMMUNITY)
Admission: RE | Admit: 2022-10-11 | Discharge: 2022-10-11 | Disposition: A | Discharge status: Home / Self Care | Payer: Medicare HMO | Source: Ambulatory Visit | Attending: Surgery | Admitting: Surgery

## 2022-10-11 ENCOUNTER — Ambulatory Visit (INDEPENDENT_AMBULATORY_CARE_PROVIDER_SITE_OTHER): Payer: Medicare HMO | Admitting: Physician Assistant

## 2022-10-11 VITALS — BP 103/69 | HR 86 | Temp 97.6°F

## 2022-10-11 DIAGNOSIS — N186 End stage renal disease: Secondary | ICD-10-CM

## 2022-10-11 NOTE — Progress Notes (Signed)
HISTORY AND PHYSICAL     CC:  dialysis access Requesting Provider:  Norm Salt, PA  HPI: This is a 82 y.o. female here for evaluation of her hemodialysis access.  Pt has hx of ESRD.    She returns today for follow up and here with her daughter Lottie Dawson.  Pt denies any pain or numbness in the right hand.    Dialysis access history: -left BVT 06/18/2021 Dr. 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 Randie Heinz -right BC AVF 01/29/2022 Dr. Chestine Spore -right cephalic vein drug coated balloon angioplasty Dr. Karin Lieu 07/07/2022 -ligation right BC AVF 08/27/2022 Dr. Chestine Spore -right 1st stage BVT 08/27/2022 Dr. Chestine Spore  The pt is right hand dominant.    Pt is on dialysis.   Days of dialysis if applicable:  T/T/S  HD center:  [facility] location.   The pt is not on a statin for cholesterol management.  The pt is not on a daily aspirin.  Other AC:  none The pt is not on medication for hypertension.  The pt is not on medication for diabetes.   Tobacco hx:  never  Past Medical History:  Diagnosis Date   CHF (congestive heart failure) (HCC)    Chronic kidney disease    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   Diabetes mellitus without complication (HCC)    type 2   Dyspnea    with exertion   Hypertension    Neuromuscular disorder (HCC)    neuropathy bil feet   PAF (paroxysmal atrial fibrillation) (HCC) 08/2020   Pneumonia    Pulmonary hypertension (HCC)     Past Surgical History:  Procedure Laterality Date   A/V FISTULAGRAM Right 07/07/2022   Procedure: A/V Fistulagram;  Surgeon: Victorino Sparrow, MD;  Location: [facility] INVASIVE CV LAB;  Service: Cardiovascular;  Laterality: Right;   A/V SHUNT INTERVENTION N/A 07/07/2022   Procedure: A/V SHUNT INTERVENTION;  Surgeon: Victorino Sparrow, MD;  Location: [facility] INVASIVE CV LAB;  Service: Cardiovascular;  Laterality: N/A;   AV FISTULA PLACEMENT Left 06/18/2021   Procedure: LEFT ARM ARTERIOVENOUS (AV) FISTULA CREATION;  Surgeon: Maeola Harman, MD;   Location: [facility] OR;  Service: Vascular;  Laterality: Left;   AV FISTULA PLACEMENT Right 01/29/2022   Procedure: RIGHT ARM BRACHIOCEPHALIC ARTERIOVENOUS (AV) FISTULA CREATION;  Surgeon: Cephus Shelling, MD;  Location: [facility] OR;  Service: Vascular;  Laterality: Right;   AV FISTULA PLACEMENT Right 08/27/2022   Procedure: RIGHT ARM FIRST STAGE BASILIC ARTERIOVENOUS (AV) FISTULA CREATION;  Surgeon: Cephus Shelling, MD;  Location: [facility] OR;  Service: Vascular;  Laterality: Right;   BASCILIC VEIN TRANSPOSITION Left 08/28/2021   Procedure: LEFT ARM BASILIC VEIN TRANSPOSITION;  Surgeon: Maeola Harman, MD;  Location: [facility] OR;  Service: Vascular;  Laterality: Left;   CARDIOVERSION N/A 01/14/2021   Procedure: CARDIOVERSION;  Surgeon: Yates Decamp, MD;  Location: [facility] ENDOSCOPY;  Service: Cardiovascular;  Laterality: N/A;   IR FLUORO GUIDE CV LINE RIGHT  06/15/2021   IR US GUIDE VASC ACCESS RIGHT  06/15/2021   LIGATION OF ARTERIOVENOUS  FISTULA Right 08/27/2022   Procedure: LIGATE EXISTING RIGHT ARM BRACHIOCEPHALIC ARTERIOVENOUS FISTULA;  Surgeon: Cephus Shelling, MD;  Location: [facility] OR;  Service: Vascular;  Laterality: Right;   TONSILLECTOMY      Allergies  Allergen Reactions   Amlodipine Other (See Comments)    Patient has LE edema to this med at higher doses (10 mg)    Current Outpatient Medications  Medication Sig Dispense Refill  linagliptin (TRADJENTA) 5 MG TABS tablet Take 1 tablet (5 mg total) by mouth daily. 30 tablet 1   oxyCODONE-acetaminophen (PERCOCET) 5-325 MG tablet Take 1 tablet by mouth every 6 (six) hours as needed for severe pain. 12 tablet 0   No current facility-administered medications for this visit.    Family History  Family history unknown: Yes    Social History   Socioeconomic History   Marital status: Divorced    Spouse name: Not on file   Number of children: 5   Years of education: Not on file   Highest education level: Not on file  Occupational History    Not on file  Tobacco Use   Smoking status: Never    Passive exposure: Never   Smokeless tobacco: Never  Vaping Use   Vaping Use: Never used  Substance and Sexual Activity   Alcohol use: No   Drug use: No   Sexual activity: Not Currently    Birth control/protection: Post-menopausal  Other Topics Concern   Not on file  Social History Narrative   4 living children   Social Determinants of Health   Financial Resource Strain: Not on file  Food Insecurity: Not on file  Transportation Needs: Not on file  Physical Activity: Not on file  Stress: Not on file  Social Connections: Not on file  Intimate Partner Violence: Not on file     PHYSICAL EXAMINATION:  Today's Vitals   10/11/22 1341  BP: 103/69  Pulse: 86  Temp: 97.6 F (36.4 C)  TempSrc: Temporal  SpO2: 98%   There is no height or weight on file to calculate BMI.    General:  WDWN female in NAD Gait: Not observed-in wheelchair HENT: WNL Pulmonary: normal non-labored breathing  Vascular Exam/Pulses:  Easily palpable right radial pulse Extremities:  motor and sensory in tact right hand.  Incision has healed.  There is an excellent thrill/bruit within the fistula.  Musculoskeletal: no muscle wasting or atrophy  Neurologic: A&O X 3  Non-Invasive Vascular Imaging:   Dialysis duplex on 10/11/2022: 0.55cm-0.61cm diameter   ASSESSMENT/PLAN: 82 y.o. female with ESRD here for evaluation of her hemodialysis access with hx of right 1st stage BVT on 08/27/2022 by Dr. Chestine Spore   -pt's fistula is maturing nicely.  Pt with increased velocity at [facility] fossa-discussed with Dr. Myra Gianotti and will proceed with 2nd stage given the fistula is maturing nicely.  -pt is on dialysis on T/T/S.  Will plan 2nd stage on a non dialysis day with Dr. Chestine Spore.   -discussed with pt that access does not last forever and will need intervention or even new access at some point.  -pt is not on anticoagulation   Doreatha Massed, [facility] [phone]  Clinic MD:   Myra Gianotti

## 2022-10-12 ENCOUNTER — Other Ambulatory Visit: Payer: Self-pay

## 2022-10-12 DIAGNOSIS — N186 End stage renal disease: Secondary | ICD-10-CM

## 2022-10-28 NOTE — Progress Notes (Addendum)
SDW CALL  Unable to reach patient over the phone. Left voice mails with instructions on both patient's cell phone and home phone. In message, repeated time change - she must now arrive at the hospital at 5:30 am for 7:30 am surgery.   PCP -  Cardiologist -   PPM/ICD - Device Orders -  Rep Notified -   Chest x-ray -  EKG - 07/23/22 Stress Test - 06/14/20 ECHO -01/30/22  Cardiac Cath -   Sleep Study -  CPAP -   Fasting Blood Sugar -  Checks Blood Sugar _____ times a day  Blood Thinner Instructions: Aspirin Instructions:na  ERAS Protcol -no PRE-SURGERY Ensure or G2-   COVID TEST- na   Anesthesia review:    Surgical Instructions    Your procedure is scheduled on Friday June 28.  Report to [facility] Main Entrance "A" at 0530 A.M., then check in with the Admitting office.  Call this number if you have problems the morning of surgery:  [phone]    Remember:  Do not eat or drink anything after midnight the night before your surgery   Take these medicines the morning of surgery with A SIP OF WATER: none  As of today, STOP taking any Aspirin (unless otherwise instructed by your surgeon) Aleve, Naproxen, Ibuprofen, Motrin, Advil, Goody's, BC's, all herbal medications, fish oil, and all vitamins.  WHAT DO I DO ABOUT MY DIABETES MEDICATION?  Do not take oral diabetes medicines (pills) the morning of surgery. Do not take linagliptin(Tradjenta) the day of surgery.   HOW TO MANAGE YOUR DIABETES BEFORE AND AFTER SURGERY  Check your blood sugar the morning of your surgery when you wake up and every 2 hours until you get to the Short Stay unit.  If your blood sugar is less than 70 mg/dL, you will need to treat for low blood sugar: Do not take insulin. Treat a low blood sugar (less than 70 mg/dL) with  cup of clear juice (cranberry or apple), 4 glucose tablets, OR glucose gel. Recheck blood sugar in 15 minutes after treatment (to make sure it is greater than 70 mg/dL).  If your blood sugar is not greater than 70 mg/dL on recheck, call [phone] for further instructions. Report your blood sugar to the short stay nurse when you get to Short Stay. [facility] is not responsible for any belongings or valuables. .   Do NOT Smoke (Tobacco/Vaping)  24 hours prior to your procedure    Contacts, glasses, hearing aids, dentures or partials may not be worn into surgery, please bring cases for these belongings   Patients discharged the day of surgery will not be allowed to drive home, and someone needs to stay with them for 24 hours.  Special instructions:    Oral Hygiene is also important to reduce your risk of infection.  Remember - BRUSH YOUR TEETH THE MORNING OF SURGERY WITH YOUR REGULAR TOOTHPASTE   Day of Surgery:  Take a shower the day of or night before with antibacterial soap. Wear Clean/Comfortable clothing the morning of surgery Do not apply any deodorants/lotions.   Do not wear jewelry or makeup Do not wear lotions, powders, perfumes/colognes, or deodorant. Do not shave 48 hours prior to surgery.  Men may shave face and neck. Do not bring valuables to the hospital. Do not wear nail polish, gel polish, artificial nails, or any other type of covering on natural nails (fingers and toes) If you have artificial nails or gel coating that  need to be removed by a nail salon, please have this removed prior to surgery. Artificial nails or gel coating may interfere with anesthesia's ability to adequately monitor your vital signs. Remember to brush your teeth WITH YOUR REGULAR TOOTHPASTE.

## 2022-10-29 ENCOUNTER — Encounter (HOSPITAL_COMMUNITY): Payer: Self-pay | Admitting: Registered Nurse

## 2022-10-29 ENCOUNTER — Ambulatory Visit (HOSPITAL_COMMUNITY): Admission: RE | Admit: 2022-10-29 | Payer: Medicare HMO | Source: Ambulatory Visit | Admitting: Vascular Surgery

## 2022-10-29 SURGERY — TRANSPOSITION, VEIN, BASILIC
Anesthesia: Choice | Laterality: Right

## 2022-10-29 NOTE — Anesthesia Preprocedure Evaluation (Signed)
Anesthesia Evaluation  Patient identified by MRN, date of birth, ID band Patient awake    Reviewed: Allergy & Precautions, H&P , NPO status , Patient's Chart, lab work & pertinent test results  Airway Mallampati: II  TM Distance: >3 FB Neck ROM: Full    Dental no notable dental hx.    Pulmonary neg pulmonary ROS   Pulmonary exam normal breath sounds clear to auscultation       Cardiovascular hypertension, +CHF  + Valvular Problems/Murmurs MR  Rhythm:Regular Rate:Normal + Systolic murmurs  1. Left ventricular ejection fraction, by estimation, is 60 to 65%. The  left ventricle has normal function. The left ventricle has no regional  wall motion abnormalities. There is moderate left ventricular hypertrophy.  Left ventricular diastolic function   could not be evaluated. There is the interventricular septum is flattened  in systole, consistent with right ventricular pressure overload.   2. Right ventricular systolic function is low normal. The right  ventricular size is mildly enlarged. There is moderately elevated  pulmonary artery systolic pressure.   3. Right atrial size was severely dilated.   4. The mitral valve is normal in structure. Mild to moderate mitral valve  regurgitation. No evidence of mitral stenosis.   5. Tricuspid valve regurgitation is severe.   6. The aortic valve is tricuspid. Aortic valve regurgitation is not  visualized. Aortic valve sclerosis is present, with no evidence of aortic  valve stenosis.   7. Pulmonic valve regurgitation is moderate.   8. The inferior vena cava is dilated in size with <50% respiratory  variability, suggesting right atrial pressure of 15 mmHg.      Neuro/Psych CVA  negative psych ROS   GI/Hepatic negative GI ROS, Neg liver ROS,,,  Endo/Other  diabetes    Renal/GU DialysisRenal disease  negative genitourinary   Musculoskeletal negative musculoskeletal ROS (+)     Abdominal   Peds negative pediatric ROS (+)  Hematology negative hematology ROS (+)   Anesthesia Other Findings   Reproductive/Obstetrics negative OB ROS                             Anesthesia Physical Anesthesia Plan  ASA: 4  Anesthesia Plan: MAC   Post-op Pain Management: Regional block*   Induction: Intravenous  PONV Risk Score and Plan: 2 and Treatment may vary due to age or medical condition  Airway Management Planned: Simple Face Mask  Additional Equipment:   Intra-op Plan:   Post-operative Plan:   Informed Consent: I have reviewed the patients History and Physical, chart, labs and discussed the procedure including the risks, benefits and alternatives for the proposed anesthesia with the patient or authorized representative who has indicated his/her understanding and acceptance.     Dental advisory given  Plan Discussed with: CRNA and Surgeon  Anesthesia Plan Comments:        Anesthesia Quick Evaluation

## 2022-12-14 ENCOUNTER — Telehealth (HOSPITAL_COMMUNITY): Payer: Self-pay | Admitting: *Deleted

## 2022-12-14 NOTE — Telephone Encounter (Signed)
Received fax from Dr Crista Elliot requesting surgery for missed 2nd stage. Surgery cancelled 6/28.  Will give to [facility]

## 2022-12-15 NOTE — Telephone Encounter (Signed)
Done

## 2022-12-20 ENCOUNTER — Telehealth: Payer: Self-pay

## 2022-12-20 ENCOUNTER — Other Ambulatory Visit: Payer: Self-pay

## 2022-12-20 DIAGNOSIS — N186 End stage renal disease: Secondary | ICD-10-CM

## 2022-12-20 NOTE — Telephone Encounter (Signed)
Attempted to reach patient/daughter to reschedule missed surgery. Left VM to return call.   Contacted [facility]- spoke with Thurston Hole and rescheduled surgery for 8/28. Instructions provided and she voiced understanding and stated she will provide patient/daughter with information when she arrives for dialysis treatment on tomorrow. Letter faxed to facility.

## 2022-12-28 NOTE — Progress Notes (Addendum)
Attempted to call patient's daughter to inform patient about arrival time of 55 tomorrow. Unable to leave voicemail. Mailbox is full. Sent SMS message with call back number.   Attempted to call patient's son, but was unable to leave voicemail. no voicemail set up.   Called [phone] multiple times. First and second call were answered, but no one responded. Third call I was able to leave a voiecmail with instructions to arrive at 0530 tomorrow. Instructed them to call back and inform us if they do not plan on coming, so they can be taken off of tomorrow's schedule.    Patient's son, Ephriam Knuckles did answer his phone. He confirmed that we do have the correct phone number for her [phone]. He stated that he will try to get in touch with her and tell her to be here at 0530 tomorrow. He was provided with the phone number to the OR and instructed to have patient call and inform them if the patient is not going to come for surgery tomorrow.

## 2022-12-28 NOTE — Anesthesia Preprocedure Evaluation (Signed)
Anesthesia Evaluation  Patient identified by MRN, date of birth, ID band Patient awake    Reviewed: Allergy & Precautions, NPO status , Patient's Chart, lab work & pertinent test results  History of Anesthesia Complications Negative for: history of anesthetic complications  Airway Mallampati: I  TM Distance: >3 FB Neck ROM: Full    Dental  (+) Edentulous Upper, Edentulous Lower, Dental Advisory Given   Pulmonary neg pulmonary ROS   breath sounds clear to auscultation       Cardiovascular hypertension, Pt. on medications +CHF  + dysrhythmias Atrial Fibrillation  Rhythm:Regular  TTE 01/2022: EF 60-65%, moderate LVH, interventricular septum flattened  in systole consistent with right ventricular pressure overload, RV systolic function is low normal, mild RVE, moderate pHTN, severe RAE, mild to moderate MR, severe TR, moderate PR       Neuro/Psych Seizures -,   Neuromuscular disease CVA    GI/Hepatic negative GI ROS, Neg liver ROS,,,  Endo/Other  diabetes, Type 2  Lab Results      Component                Value               Date                      HGBA1C                   6.1 (H)             07/23/2022                Renal/GU ESRFRenal diseaseLab Results      Component                Value               Date                      CREATININE               2.00 (H)            08/27/2022            Lab Results      Component                Value               Date                      K                        4.3                 08/27/2022             negative genitourinary   Musculoskeletal negative musculoskeletal ROS (+)    Abdominal   Peds  Hematology negative hematology ROS (+) Lab Results      Component                Value               Date                      WBC                      6.5  07/23/2022                HGB                      14.3                08/27/2022                HCT                       42.0                08/27/2022                MCV                      96.1                07/23/2022                PLT                      105 (L)             07/23/2022              Anesthesia Other Findings Day of surgery medications reviewed with patient.  Reproductive/Obstetrics negative OB ROS                             Patient identified by MRN, date of birth, ID band Patient confused    Reviewed: NPO status , Patient's Chart, lab work & pertinent test results  Airway Mallampati: II  TM Distance: >3 FB Neck ROM: Full    Dental  (+) Upper Dentures, Lower Dentures   Pulmonary    Pulmonary exam normal        Cardiovascular hypertension, Pt. on medications +CHF  + dysrhythmias Atrial Fibrillation  Rhythm:Regular Rate:Normal     Neuro/Psych Dementia    GI/Hepatic negative GI ROS, Neg liver ROS,   Endo/Other  diabetes, Type 2, Insulin Dependent  Renal/GU ESRF and DialysisRenal disease  negative genitourinary   Musculoskeletal negative musculoskeletal ROS (+)   Abdominal Normal abdominal exam  (+)   Peds  Hematology  (+) Blood dyscrasia, anemia ,   Anesthesia Other Findings   Reproductive/Obstetrics                            Anesthesia Physical Anesthesia Plan  ASA: 3  Anesthesia Plan: MAC and Regional   Post-op Pain Management: Regional block*   Induction: Intravenous  PONV Risk Score and Plan: 2 and Ondansetron, Dexamethasone, Propofol infusion and Treatment may vary due to age or medical condition  Airway Management Planned: Simple Face Mask, Natural Airway and Nasal Cannula  Additional Equipment: None  Intra-op Plan:   Post-operative Plan:   Informed Consent: I have reviewed the patients History and Physical, chart, labs and discussed the procedure including the risks, benefits and alternatives for the proposed anesthesia with the patient or authorized  representative who has indicated his/her understanding and acceptance.     Dental advisory given  Plan Discussed with: CRNA  Anesthesia Plan Comments: (PAT note by Antionette Poles, PA-C: Follows with cardiology for history of moderate pulmonary hypertension, DOE, HFpEF (grade 3 DD on echo 12/2020), paroxysmal atrial fibrillation, moderate MR,  severe TR. Admitted 08/2020 with influenza and found to have new onset atrial fibrillation.  She underwent successful direct-current cardioversion 09/13/2020.  When last seen for cardiology follow-up 05/12/2021, tentative plan at that time was to resume Eliquis due to CHA2DS2-VASc score of 5 (had evidently been stopped inadvertently by patient), however it does not appear this ever happened.  Patient was admitted February 2023 for worsening kidney function bilateral lower extremity swelling.  During admission she was started on dialysis via tunneled catheter placed on 2/13.  She underwent left arm brachial artery to basilic vein AV fistula creation on 06/18/2021.  She was discharged with dialysis on Tuesday Thursday Saturday schedule.  Uncontrolled IDDM 2, last A1c 9.6 on 04/25/2021.  Patient will need day of surgery labs and evaluation.  EKG 06/12/2021: Wide QRS rhythm.  Rate 81.  Right bundle branch block.  Septal infarct, age undetermined.  No significant change from prior.  Echocardiogram 12/26/2020:  Normal LV systolic function with visual EF 60-65%. Left ventricle cavity  is normal in size. Mild left ventricular hypertrophy. D-shaped septum in  systole and diastole suggestive of RV pressure and volume overload.    Normal global wall motion. Doppler evidence of grade III (restrictive)  diastolic dysfunction, elevated LAP.  Left atrial cavity is mildly dilated. Atrial septum bows from right to  left, suggestive of elevated RAP.  Right atrial cavity is visually dilated.  Right ventricle cavity is visually dilated and normal function.  Mild (Grade I) aortic  regurgitation.  Mild mitral valve annulus calcification. Moderate (Grade III) mitral  regurgitation.  Severe tricuspid regurgitation. Moderate pulmonary hypertension. RVSP  measures 51 mmHg (may be unappreciated given RAE and dilated IVC leading  to equalization of pressures).  Moderate pulmonic regurgitation.  IVC is dilated with a respiratory response of <50%.  Compared to study 06/19/2020 G2DD is now G3DD,TR and PHTN remains stable  otherwise no significant change.   )       Anesthesia Quick Evaluation  Anesthesia Physical Anesthesia Plan  ASA: 4  Anesthesia Plan: General   Post-op Pain Management: Tylenol PO (pre-op)*   Induction: Intravenous  PONV Risk Score and Plan: 3 and Treatment may vary due to age or medical condition, Dexamethasone and Ondansetron  Airway Management Planned: LMA  Additional Equipment: None  Intra-op Plan:   Post-operative Plan: Extubation in OR  Informed Consent: I have reviewed the patients History and Physical, chart, labs and discussed the procedure including the risks, benefits and alternatives for the proposed anesthesia with the patient or authorized representative who has indicated his/her understanding and acceptance.     Dental advisory given  Plan Discussed with: CRNA and Anesthesiologist  Anesthesia Plan Comments: (PAT note written 01/28/2022 by Shonna Chock, PA-C. )        Anesthesia Quick Evaluation

## 2022-12-28 NOTE — Progress Notes (Signed)
Called pt's mobile phone. Female person answered. Call ended abruptly as I began speaking. Called back immediately and call went directly to voicemail. Left message instructing patient to arrive at hospital at 0940 and check in at admitting office, no medicine in the am : nothing to eat or drink after midnight. Shower with antibacterial soap in the am and brush your teeth. Check your blood sugar when you get up. If it <70 drink 1/2 cup apple juice or cranberry juice and recheck in 15 minutes. If it is not greater than 70 on recheck, call [phone]. Also call this number if you are running late or you are sick in the morning.

## 2022-12-29 ENCOUNTER — Ambulatory Visit (HOSPITAL_COMMUNITY): Payer: Medicare HMO | Admitting: Anesthesiology

## 2022-12-29 ENCOUNTER — Ambulatory Visit (HOSPITAL_COMMUNITY)
Admission: RE | Admit: 2022-12-29 | Discharge: 2022-12-29 | Disposition: A | Discharge status: Home / Self Care | Payer: Medicare HMO | Source: Ambulatory Visit | Attending: Vascular Surgery | Admitting: Vascular Surgery

## 2022-12-29 ENCOUNTER — Encounter (HOSPITAL_COMMUNITY): Payer: Self-pay | Admitting: Vascular Surgery

## 2022-12-29 ENCOUNTER — Other Ambulatory Visit: Payer: Self-pay

## 2022-12-29 ENCOUNTER — Ambulatory Visit (HOSPITAL_BASED_OUTPATIENT_CLINIC_OR_DEPARTMENT_OTHER): Payer: Medicare HMO | Admitting: Anesthesiology

## 2022-12-29 ENCOUNTER — Encounter (HOSPITAL_COMMUNITY)
Admission: RE | Disposition: A | Discharge status: Home / Self Care | Payer: Self-pay | Source: Ambulatory Visit | Attending: Vascular Surgery

## 2022-12-29 DIAGNOSIS — I272 Pulmonary hypertension, unspecified: Secondary | ICD-10-CM | POA: Insufficient documentation

## 2022-12-29 DIAGNOSIS — G709 Myoneural disorder, unspecified: Secondary | ICD-10-CM | POA: Diagnosis not present

## 2022-12-29 DIAGNOSIS — I5023 Acute on chronic systolic (congestive) heart failure: Secondary | ICD-10-CM

## 2022-12-29 DIAGNOSIS — I480 Paroxysmal atrial fibrillation: Secondary | ICD-10-CM | POA: Insufficient documentation

## 2022-12-29 DIAGNOSIS — N186 End stage renal disease: Secondary | ICD-10-CM | POA: Diagnosis not present

## 2022-12-29 DIAGNOSIS — E1122 Type 2 diabetes mellitus with diabetic chronic kidney disease: Secondary | ICD-10-CM | POA: Insufficient documentation

## 2022-12-29 DIAGNOSIS — I509 Heart failure, unspecified: Secondary | ICD-10-CM | POA: Diagnosis not present

## 2022-12-29 DIAGNOSIS — Z992 Dependence on renal dialysis: Secondary | ICD-10-CM | POA: Insufficient documentation

## 2022-12-29 DIAGNOSIS — Z8673 Personal history of transient ischemic attack (TIA), and cerebral infarction without residual deficits: Secondary | ICD-10-CM | POA: Insufficient documentation

## 2022-12-29 DIAGNOSIS — I132 Hypertensive heart and chronic kidney disease with heart failure and with stage 5 chronic kidney disease, or end stage renal disease: Secondary | ICD-10-CM

## 2022-12-29 DIAGNOSIS — Z7984 Long term (current) use of oral hypoglycemic drugs: Secondary | ICD-10-CM | POA: Insufficient documentation

## 2022-12-29 DIAGNOSIS — N185 Chronic kidney disease, stage 5: Secondary | ICD-10-CM | POA: Diagnosis not present

## 2022-12-29 HISTORY — PX: LIGATION OF ARTERIOVENOUS  FISTULA: SHX5948

## 2022-12-29 HISTORY — PX: BASCILIC VEIN TRANSPOSITION: SHX5742

## 2022-12-29 LAB — GLUCOSE, CAPILLARY
Glucose-Capillary: 247 mg/dL — ABNORMAL HIGH (ref 70–99)
Glucose-Capillary: 195 mg/dL — ABNORMAL HIGH (ref 70–99)

## 2022-12-29 LAB — POCT I-STAT, CHEM 8
BUN: 23 mg/dL (ref 8–23)
Calcium, Ion: 1.09 mmol/L — ABNORMAL LOW (ref 1.15–1.40)
Chloride: 99 mmol/L (ref 98–111)
Creatinine, Ser: 2.5 mg/dL — ABNORMAL HIGH (ref 0.44–1.00)
Glucose, Bld: 262 mg/dL — ABNORMAL HIGH (ref 70–99)
HCT: 33 % — ABNORMAL LOW (ref 36.0–46.0)
Hemoglobin: 11.2 g/dL — ABNORMAL LOW (ref 12.0–15.0)
Potassium: 4.4 mmol/L (ref 3.5–5.1)
Sodium: 136 mmol/L (ref 135–145)
TCO2: 27 mmol/L (ref 22–32)

## 2022-12-29 SURGERY — TRANSPOSITION, VEIN, BASILIC
Anesthesia: General | Site: Arm Upper | Laterality: Right

## 2022-12-29 MED ORDER — ACETAMINOPHEN 500 MG PO TABS
1000.0000 mg | ORAL_TABLET | Freq: Once | ORAL | Status: AC
  Administered 2022-12-29: 1000 mg via ORAL
  Filled 2022-12-29: qty 2

## 2022-12-29 MED ORDER — ALBUMIN HUMAN 5 % IV SOLN
12.5000 g | Freq: Once | INTRAVENOUS | Status: AC
  Administered 2022-12-29: 12.5 g via INTRAVENOUS

## 2022-12-29 MED ORDER — INSULIN ASPART 100 UNIT/ML IJ SOLN
0.0000 [IU] | INTRAMUSCULAR | Status: DC | PRN
  Administered 2022-12-29: 3 [IU] via SUBCUTANEOUS
  Filled 2022-12-29: qty 1

## 2022-12-29 MED ORDER — HEPARIN SODIUM (PORCINE) 1000 UNIT/ML IJ SOLN
1.6000 mL | Freq: Once | INTRAMUSCULAR | Status: AC
  Administered 2022-12-29: 1600 [IU]

## 2022-12-29 MED ORDER — FENTANYL CITRATE (PF) 100 MCG/2ML IJ SOLN: 25.0000 ug | INTRAMUSCULAR | Status: DC | PRN

## 2022-12-29 MED ORDER — SODIUM CHLORIDE 0.9 % IV SOLN: INTRAVENOUS | Status: DC

## 2022-12-29 MED ORDER — HEPARIN 6000 UNIT IRRIGATION SOLUTION
Status: DC | PRN
  Administered 2022-12-29: 1

## 2022-12-29 MED ORDER — PHENYLEPHRINE HCL-NACL 20-0.9 MG/250ML-% IV SOLN
INTRAVENOUS | Status: DC | PRN
  Administered 2022-12-29: 40 ug/min via INTRAVENOUS

## 2022-12-29 MED ORDER — 0.9 % SODIUM CHLORIDE (POUR BTL) OPTIME
TOPICAL | Status: DC | PRN
  Administered 2022-12-29: 1000 mL

## 2022-12-29 MED ORDER — ALBUMIN HUMAN 5 % IV SOLN
INTRAVENOUS | Status: AC
  Filled 2022-12-29: qty 250

## 2022-12-29 MED ORDER — ONDANSETRON HCL 4 MG/2ML IJ SOLN
INTRAMUSCULAR | Status: DC | PRN
  Administered 2022-12-29: 4 mg via INTRAVENOUS

## 2022-12-29 MED ORDER — CEFAZOLIN SODIUM-DEXTROSE 2-4 GM/100ML-% IV SOLN
2.0000 g | INTRAVENOUS | Status: AC
  Administered 2022-12-29: 2 g via INTRAVENOUS
  Filled 2022-12-29: qty 100

## 2022-12-29 MED ORDER — HEMOSTATIC AGENTS (NO CHARGE) OPTIME
TOPICAL | Status: DC | PRN
  Administered 2022-12-29: 1 via TOPICAL

## 2022-12-29 MED ORDER — OXYCODONE HCL 5 MG PO TABS: 5.0000 mg | ORAL_TABLET | Freq: Once | ORAL | Status: DC | PRN

## 2022-12-29 MED ORDER — ALBUMIN HUMAN 5 % IV SOLN
INTRAVENOUS | Status: AC
  Administered 2022-12-29: 12.5 g via INTRAVENOUS
  Filled 2022-12-29: qty 250

## 2022-12-29 MED ORDER — PROPOFOL 10 MG/ML IV BOLUS
INTRAVENOUS | Status: AC
  Filled 2022-12-29: qty 20

## 2022-12-29 MED ORDER — LIDOCAINE-EPINEPHRINE (PF) 1 %-1:200000 IJ SOLN
INTRAMUSCULAR | Status: AC
  Filled 2022-12-29: qty 30

## 2022-12-29 MED ORDER — FENTANYL CITRATE (PF) 250 MCG/5ML IJ SOLN
INTRAMUSCULAR | Status: DC | PRN
  Administered 2022-12-29: 25 ug via INTRAVENOUS

## 2022-12-29 MED ORDER — OXYCODONE HCL 5 MG/5ML PO SOLN: 5.0000 mg | Freq: Once | ORAL | Status: DC | PRN

## 2022-12-29 MED ORDER — PROPOFOL 10 MG/ML IV BOLUS
INTRAVENOUS | Status: DC | PRN
  Administered 2022-12-29: 20 mg via INTRAVENOUS
  Administered 2022-12-29: 130 mg via INTRAVENOUS

## 2022-12-29 MED ORDER — CHLORHEXIDINE GLUCONATE 0.12 % MT SOLN
OROMUCOSAL | Status: AC
  Administered 2022-12-29: 15 mL
  Filled 2022-12-29: qty 15

## 2022-12-29 MED ORDER — CHLORHEXIDINE GLUCONATE 4 % EX SOLN: 60.0000 mL | Freq: Once | CUTANEOUS | Status: DC

## 2022-12-29 MED ORDER — ALBUMIN HUMAN 5 % IV SOLN: 12.5000 g | Freq: Once | INTRAVENOUS | Status: AC

## 2022-12-29 MED ORDER — HEPARIN SODIUM (PORCINE) 1000 UNIT/ML IJ SOLN
INTRAMUSCULAR | Status: DC | PRN
  Administered 2022-12-29: 3000 [IU] via INTRAVENOUS

## 2022-12-29 MED ORDER — PHENYLEPHRINE 80 MCG/ML (10ML) SYRINGE FOR IV PUSH (FOR BLOOD PRESSURE SUPPORT)
PREFILLED_SYRINGE | INTRAVENOUS | Status: DC | PRN
  Administered 2022-12-29 (×5): 160 ug via INTRAVENOUS

## 2022-12-29 MED ORDER — OXYCODONE-ACETAMINOPHEN 5-325 MG PO TABS: 1.0000 | ORAL_TABLET | Freq: Four times a day (QID) | ORAL | 0 refills | Status: DC | PRN

## 2022-12-29 MED ORDER — HEPARIN 6000 UNIT IRRIGATION SOLUTION
Status: AC
  Filled 2022-12-29: qty 500

## 2022-12-29 MED ORDER — FENTANYL CITRATE (PF) 250 MCG/5ML IJ SOLN
INTRAMUSCULAR | Status: AC
  Filled 2022-12-29: qty 5

## 2022-12-29 MED ORDER — EPHEDRINE SULFATE-NACL 50-0.9 MG/10ML-% IV SOSY
PREFILLED_SYRINGE | INTRAVENOUS | Status: DC | PRN
  Administered 2022-12-29 (×2): 160 mg via INTRAVENOUS

## 2022-12-29 SURGICAL SUPPLY — 45 items
ADH SKN CLS APL DERMABOND .7 (GAUZE/BANDAGES/DRESSINGS) ×2
AGENT HMST SPONGE THK3/8 (HEMOSTASIS)
ARMBAND PINK RESTRICT EXTREMIT (MISCELLANEOUS) ×2 IMPLANT
BAG COUNTER SPONGE SURGICOUNT (BAG) ×2 IMPLANT
BAG SPNG CNTER NS LX DISP (BAG)
BNDG CMPR 5X4 KNIT ELC UNQ LF (GAUZE/BANDAGES/DRESSINGS) ×4
BNDG ELASTIC 4INX 5YD STR LF (GAUZE/BANDAGES/DRESSINGS) IMPLANT
CANISTER SUCT 3000ML PPV (MISCELLANEOUS) ×2 IMPLANT
CLIP TI MEDIUM 24 (CLIP) ×2 IMPLANT
CLIP TI WIDE RED SMALL 24 (CLIP) ×2 IMPLANT
COVER PROBE W GEL 5X96 (DRAPES) ×2 IMPLANT
DERMABOND ADVANCED .7 DNX12 (GAUZE/BANDAGES/DRESSINGS) ×2 IMPLANT
ELECT REM PT RETURN 9FT ADLT (ELECTROSURGICAL) ×2
ELECTRODE REM PT RTRN 9FT ADLT (ELECTROSURGICAL) ×2 IMPLANT
GLOVE BIO SURGEON STRL SZ7.5 (GLOVE) ×2 IMPLANT
GLOVE BIOGEL PI IND STRL 8 (GLOVE) ×2 IMPLANT
GOWN STRL REUS W/ TWL LRG LVL3 (GOWN DISPOSABLE) ×4 IMPLANT
GOWN STRL REUS W/ TWL XL LVL3 (GOWN DISPOSABLE) ×4 IMPLANT
GOWN STRL REUS W/TWL LRG LVL3 (GOWN DISPOSABLE) ×6
GOWN STRL REUS W/TWL XL LVL3 (GOWN DISPOSABLE) ×2
GRAFT GORETEX STRT 4-7X45 (Vascular Products) IMPLANT
HEMOSTAT SPONGE AVITENE ULTRA (HEMOSTASIS) IMPLANT
KIT BASIN OR (CUSTOM PROCEDURE TRAY) ×2 IMPLANT
KIT TURNOVER KIT B (KITS) ×2 IMPLANT
LOOP VASCULAR MINI 18 RED (MISCELLANEOUS) ×4
NDL HYPO 25GX1X1/2 BEV (NEEDLE) ×2 IMPLANT
NEEDLE HYPO 25GX1X1/2 BEV (NEEDLE) ×2
NS IRRIG 1000ML POUR BTL (IV SOLUTION) ×2 IMPLANT
PACK CV ACCESS (CUSTOM PROCEDURE TRAY) ×2 IMPLANT
PAD ARMBOARD 7.5X6 YLW CONV (MISCELLANEOUS) ×4 IMPLANT
SLING ARM FOAM STRAP LRG (SOFTGOODS) IMPLANT
SLING ARM FOAM STRAP MED (SOFTGOODS) IMPLANT
SPIKE FLUID TRANSFER (MISCELLANEOUS) ×2 IMPLANT
SUT MNCRL AB 4-0 PS2 18 (SUTURE) ×2 IMPLANT
SUT PROLENE 6 0 BV (SUTURE) ×2 IMPLANT
SUT PROLENE 7 0 BV 1 (SUTURE) IMPLANT
SUT SILK 2 0 SH (SUTURE) IMPLANT
SUT VIC AB 2-0 CT1 27 (SUTURE) ×2
SUT VIC AB 2-0 CT1 TAPERPNT 27 (SUTURE) ×2 IMPLANT
SUT VIC AB 3-0 SH 27 (SUTURE) ×4
SUT VIC AB 3-0 SH 27X BRD (SUTURE) ×4 IMPLANT
TOWEL GREEN STERILE (TOWEL DISPOSABLE) ×2 IMPLANT
UNDERPAD 30X36 HEAVY ABSORB (UNDERPADS AND DIAPERS) ×2 IMPLANT
VASCULAR TIE MINI RED 18IN STL (MISCELLANEOUS) IMPLANT
WATER STERILE IRR 1000ML POUR (IV SOLUTION) ×2 IMPLANT

## 2022-12-29 NOTE — Transfer of Care (Signed)
Immediate Anesthesia Transfer of Care Note  Patient: [name]  Procedure(s) Performed: RIGHT ARM ARTERIOVENOUS GRAFT INSERTION USING 4-71mm GORETEX STRETCH GRAFT (Right: Arm Upper) LIGATION OF RIGHT ARM BASILIC ARTERIOVENOUS  FISTULA (Right)  Patient Location: PACU  Anesthesia Type:General  Level of Consciousness: awake and alert   Airway & Oxygen Therapy: Patient Spontanous Breathing and Patient connected to face mask oxygen  Post-op Assessment: Report given to RN and Post -op Vital signs reviewed and stable  Post vital signs: Reviewed and stable  Last Vitals:  Vitals Value Taken Time  BP 114/64 12/29/22 1154  Temp    Pulse 71 12/29/22 1159  Resp 14 12/29/22 1159  SpO2 92 % 12/29/22 1159  Vitals shown include unfiled device data.  Last Pain:  Vitals:   12/29/22 0910  PainSc: 0-No pain      Patients Stated Pain Goal: 0 (12/29/22 0910)  Complications: No notable events documented.

## 2022-12-29 NOTE — Anesthesia Postprocedure Evaluation (Signed)
Anesthesia Post Note  Patient: [name]  Procedure(s) Performed: RIGHT ARM ARTERIOVENOUS GRAFT INSERTION USING 4-67mm GORETEX STRETCH GRAFT (Right: Arm Upper) LIGATION OF RIGHT ARM BASILIC ARTERIOVENOUS  FISTULA (Right)     Patient location during evaluation: PACU Anesthesia Type: General Level of consciousness: awake and alert Pain management: pain level controlled Vital Signs Assessment: post-procedure vital signs reviewed and stable Respiratory status: spontaneous breathing, nonlabored ventilation and respiratory function stable Cardiovascular status: blood pressure returned to baseline Postop Assessment: no apparent nausea or vomiting Anesthetic complications: no Comments: BP 90s/40s after additional volume given in PACU. Patient is awake, conversant, and denies any lightheadeness, dizziness, nausea, etc. She is motivated to be discharged. Plan to discharge to home as long as she continues to be asymptomatic while getting dressed, etc. Terry Peters, MD   No notable events documented.  Last Vitals:  Vitals:   12/29/22 1345 12/29/22 1400  BP: (!) 84/44 (!) 90/46  Pulse: 69 76  Resp: 15 14  Temp:  36.6 C  SpO2: 94% 95%    Last Pain:  Vitals:   12/29/22 1400  PainSc: 0-No pain                 [name]

## 2022-12-29 NOTE — Discharge Instructions (Signed)
   [facility]  Discharge Instructions  AV Fistula or Graft Surgery for Dialysis Access  Please refer to the following instructions for your post-procedure care. Your surgeon or physician assistant will discuss any changes with you.  Activity  You may drive the day following your surgery, if you are comfortable and no longer taking prescription pain medication. Resume full activity as the soreness in your incision resolves.  Bathing/Showering  You may shower after you go home. Keep your incision dry for 48 hours. Do not soak in a bathtub, hot tub, or swim until the incision heals completely. You may not shower if you have a hemodialysis catheter.  Incision Care  Clean your incision with mild soap and water after 48 hours. Pat the area dry with a clean towel. You do not need a bandage unless otherwise instructed. Do not apply any ointments or creams to your incision. You may have skin glue on your incision. Do not peel it off. It will come off on its own in about one week. Your arm may swell a bit after surgery. To reduce swelling use pillows to elevate your arm so it is above your heart. Your doctor will tell you if you need to lightly wrap your arm with an ACE bandage.  Diet  Resume your normal diet. There are not special food restrictions following this procedure. In order to heal from your surgery, it is CRITICAL to get adequate nutrition. Your body requires vitamins, minerals, and protein. Vegetables are the best source of vitamins and minerals. Vegetables also provide the perfect balance of protein. Processed food has little nutritional value, so try to avoid this.  Medications  Resume taking all of your medications. If your incision is causing pain, you may take over-the counter pain relievers such as acetaminophen (Tylenol). If you were prescribed a stronger pain medication, please be aware these medications can cause nausea and constipation. Prevent  nausea by taking the medication with a snack or meal. Avoid constipation by drinking plenty of fluids and eating foods with high amount of fiber, such as fruits, vegetables, and grains.  Do not take Tylenol if you are taking prescription pain medications.  Follow up Your surgeon may want to see you in the office following your access surgery. If so, this will be arranged at the time of your surgery.  Please call us immediately for any of the following conditions:  Increased pain, redness, drainage (pus) from your incision site Fever of 101 degrees or higher Severe or worsening pain at your incision site Hand pain or numbness.  Reduce your risk of vascular disease:  Stop smoking. If you would like help, call [facility] at 1-800-QUIT-NOW ((306) 185-6277) or [place] at [phone]  Manage your cholesterol Maintain a desired weight Control your diabetes Keep your blood pressure down  Dialysis  It will take several weeks to several months for your new dialysis access to be ready for use. Your surgeon will determine when it is okay to use it. Your nephrologist will continue to direct your dialysis. You can continue to use your Permcath until your new access is ready for use.   12/29/2022 [name] [id number] 07-24-1940  Surgeon(s): Cephus Shelling, MD  Procedure(s): RIGHT ARM ARTERIOVENOUS GRAFT INSERTION USING 4-35mm GORETEX STRETCH GRAFT LIGATION OF RIGHT ARM BASILIC ARTERIOVENOUS  FISTULA  x Do not stick graft for 4 weeks    If you have any questions, please call the office at [phone].

## 2022-12-29 NOTE — Progress Notes (Signed)
Dr. [name] made aware that patient's blood sugar is 247 and per the diabetes protocol the patient should receive 3 units of Novolog. Ok per Dr. [name].

## 2022-12-29 NOTE — Op Note (Signed)
OPERATIVE NOTE  DATE: December 29, 2022  PROCEDURE:   Right upper arm arteriovenous graft (4 mm x 7 mm GoreTex)  Ligation right brachiobasilic AVF  PRE-OPERATIVE DIAGNOSIS: end stage renal disease   POST-OPERATIVE DIAGNOSIS: same  SURGEON: Cephus Shelling, MD  ASSISTANT(S): Doreatha Massed, PA  ANESTHESIA:  LMA  ESTIMATED BLOOD LOSS: <100 mL  FINDING(S): The right upper arm basilic vein fistula was sclerotic at the arterial anastomosis when we evaluated this with ultrasound in the operating room.  There was a very limited thrill.  I did not think this was suitable for second stabe.  I elected to ligate this fistula.  I then placed a new right upper arm AV graft with 4 mm end sewn to the brachial artery above the antecubitum and the 7 mm end of the graft sewn to the brachial vein in the axilla.  Palpable thrill at completion.  Doppler radial pulse at the wrist.    SPECIMEN(S):  None  INDICATIONS:   [name] is a 82 y.o. female who presents with end stage renal disease and the need for permanent hemodialysis access.  Risk, benefits, and alternatives to access surgery were discussed.  The patient is aware the risks include but are not limited to: bleeding, infection, steal syndrome, nerve damage, ischemic monomelic neuropathy, failure to mature, and need for additional procedures.  The patient is aware of the risks and elects to proceed forward.  DESCRIPTION: After full informed written consent was obtained from the patient, the patient was brought back to the operating room and placed supine upon the operating table.  The patient was given IV antibiotics prior to proceeding.  After obtaining adequate sedation, the patient was prepped and draped in standard fashion for a right arm access procedure.  I evaluated the first stage basilic vein fistula with ultrasound.  The vein was sclerotic and stenotic adjacent to the arterial anastomosis.  I did not think this was suitable for  second stage.  I elected to place a right upper arm AVG.  I turned my attention first to the antecubitum.  Under ultrasound guidance, I identified the location of the brachial artery and marked it on the skin.  I then looked on the upper arm near the axilla and marked the brachial vein as well as axillary vein. I made a longitudinal incision over the brachial artery above the antecubitum and another longitudinal incision over the brachial vein near the axillary vein.  I dissected down through the subcutaneous tissue and  fascia carefully and was able to dissect out the brachial artery.  The artery was about 3.5 mm externally.  It was controlled proximally and distally with vessel loops.  I then dissected out the larger brachial vein through the upper arm incision.  Externally, it appeared to be 4 mm in diameter.  I then dissected this vein proximally and distal.  II took a metal Gore tunneler and dissected from the antecubital incision to the axillary incision.  Then I delivered the 4 x 7-mm stretch Gore-Tex graft, through this metal tunneler and then pulled out the metal tunneler leaving the graft in place.   Prior to sewing the anastomosis I did find the basilic vein fistula through the incision at the antecubitum and the basilic vein fistula was ligated. The 4-mm end was left on the brachial artery side and the 7-mm end toward the vein side.  I then gave the patient 3,000 units of heparin to gain anticoagulation.  After waiting 2  minutes, I placed the brachial artery under tension proximally and distally with vessel loops, made an arteriotomy and extended it with a Potts scissor.  I sewed the 4-mm end of the graft to this arteriotomy with a running stitch of 6-0 Prolene.  At this point, then I completed the anastomosis in the usual fashion.  I released the vessel loops on the inflow and allowed the artery to decompress through the graft. There was good pulsatile bleeding through this graft.  I clamped the  graft near its arterial anastomosis and sucked out all the blood in the graft and loaded the graft with heparinized saline.  At this point, I pulled the graft to appropriate length and reset my exposure of the high brachial vein. The vein was controlled with Henle clamps and opened with 11 blade scalpel and extended with Potts scissors.  There was good venous backbleeding from the vein. I spatulated the graft to facilitate an end-to-side anastomosis.  In the process of spatulating, I cut the graft to appropriate length for this anastomosis.  This graft was sewn to the vein in an end-to-side configuration with a 6-0 Prolene.  Prior to completing this anastomosis, I allowed the vein to back bleed and then I also allowed the artery to bleed in an antegrade fashion.  I completed this anastomosis in the usual fashion.  I irrigated out both incisions.  I used surgicel snow to get hemostasis.   The graft had a good palpable thrill.  The radial and ulnar artery has good signals.  The subcutaneous tissue in each incision was reapproximated with a running stitch of 3-0 Vicryl.  The skin was then reapproximated with a running subcuticular 4-0 Monocryl.  The skin was then cleaned, dried, and Dermabond used to reinforce the skin closure.   COMPLICATIONS: None  CONDITION: Stable  Cephus Shelling, MD [facility] Office: [phone]  Cephus Shelling    12/29/2022, 12:06 PM

## 2022-12-29 NOTE — Progress Notes (Signed)
MD notified of pt BP. MD came to bedside to evaluate pt. MD states pt is okay to discharge home. Pt states denies being light headed or dizzy. Pt shows no signs or symptoms of any pain or discomfort.

## 2022-12-29 NOTE — H&P (Addendum)
History and Physical Interval Note:  12/29/2022 9:18 AM  [name]  has presented today for surgery, with the diagnosis of ESRD.  The various methods of treatment have been discussed with the patient and family. After consideration of risks, benefits and other options for treatment, the patient has consented to  Procedure(s): RIGHT ARM SECOND STAGE BASILIC VEIN TRANSPOSITION (Right) as a surgical intervention.  The patient's history has been reviewed, patient examined, no change in status, stable for surgery.  I have reviewed the patient's chart and labs.  Questions were answered to the patient's satisfaction.    Right second stage basilic vein.  Thrill is marginal and discussed may place right arm AVG if needed.  Cephus Shelling  HISTORY AND PHYSICAL        CC:  dialysis access Requesting Provider:  Norm Salt, PA   HPI: This is a 82 y.o. female here for evaluation of her hemodialysis access.  Pt has hx of ESRD.     She returns today for follow up and here with her [name].  Pt denies any pain or numbness in the right hand.     Dialysis access history: -left BVT 06/18/2021 Dr. 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 Randie Heinz -right BC AVF 01/29/2022 Dr. Chestine Spore -right cephalic vein drug coated balloon angioplasty Dr. Karin Lieu 07/07/2022 -ligation right BC AVF 08/27/2022 Dr. Chestine Spore -right 1st stage BVT 08/27/2022 Dr. Chestine Spore   The pt is right hand dominant.    Pt is on dialysis.   Days of dialysis if applicable:  T/T/S  HD center:  [facility] location.     The pt is not on a statin for cholesterol management.  The pt is not on a daily aspirin.  Other AC:  none The pt is not on medication for hypertension.  The pt is not on medication for diabetes.   Tobacco hx:  never       Past Medical History:  Diagnosis Date   CHF (congestive heart failure) (HCC)     Chronic kidney disease      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   Diabetes mellitus without complication  (HCC)      type 2   Dyspnea      with exertion   Hypertension     Neuromuscular disorder (HCC)      neuropathy bil feet   PAF (paroxysmal atrial fibrillation) (HCC) 08/2020   Pneumonia     Pulmonary hypertension (HCC)                 Past Surgical History:  Procedure Laterality Date   A/V FISTULAGRAM Right 07/07/2022    Procedure: A/V Fistulagram;  Surgeon: Victorino Sparrow, MD;  Location: [facility] INVASIVE CV LAB;  Service: Cardiovascular;  Laterality: Right;   A/V SHUNT INTERVENTION N/A 07/07/2022    Procedure: A/V SHUNT INTERVENTION;  Surgeon: Victorino Sparrow, MD;  Location: [facility] INVASIVE CV LAB;  Service: Cardiovascular;  Laterality: N/A;   AV FISTULA PLACEMENT Left 06/18/2021    Procedure: LEFT ARM ARTERIOVENOUS (AV) FISTULA CREATION;  Surgeon: Maeola Harman, MD;  Location: [facility] OR;  Service: Vascular;  Laterality: Left;   AV FISTULA PLACEMENT Right 01/29/2022    Procedure: RIGHT ARM BRACHIOCEPHALIC ARTERIOVENOUS (AV) FISTULA CREATION;  Surgeon: Cephus Shelling, MD;  Location: [facility] OR;  Service: Vascular;  Laterality: Right;   AV FISTULA PLACEMENT Right 08/27/2022    Procedure: RIGHT ARM FIRST STAGE BASILIC ARTERIOVENOUS (AV) FISTULA CREATION;  Surgeon: Sherald Hess  J, MD;  Location: [facility] OR;  Service: Vascular;  Laterality: Right;   BASCILIC VEIN TRANSPOSITION Left 08/28/2021    Procedure: LEFT ARM BASILIC VEIN TRANSPOSITION;  Surgeon: Maeola Harman, MD;  Location: [facility] OR;  Service: Vascular;  Laterality: Left;   CARDIOVERSION N/A 01/14/2021    Procedure: CARDIOVERSION;  Surgeon: Yates Decamp, MD;  Location: [facility] ENDOSCOPY;  Service: Cardiovascular;  Laterality: N/A;   IR FLUORO GUIDE CV LINE RIGHT   06/15/2021   IR US GUIDE VASC ACCESS RIGHT   06/15/2021   LIGATION OF ARTERIOVENOUS  FISTULA Right 08/27/2022    Procedure: LIGATE EXISTING RIGHT ARM BRACHIOCEPHALIC ARTERIOVENOUS FISTULA;  Surgeon: Cephus Shelling, MD;  Location: [facility] OR;  Service: Vascular;  Laterality:  Right;   TONSILLECTOMY              Allergies       Allergies  Allergen Reactions   Amlodipine Other (See Comments)      Patient has LE edema to this med at higher doses (10 mg)              Current Outpatient Medications  Medication Sig Dispense Refill   linagliptin (TRADJENTA) 5 MG TABS tablet Take 1 tablet (5 mg total) by mouth daily. 30 tablet 1   oxyCODONE-acetaminophen (PERCOCET) 5-325 MG tablet Take 1 tablet by mouth every 6 (six) hours as needed for severe pain. 12 tablet 0      No current facility-administered medications for this visit.        Family History  Family history unknown: Yes          Social History         Socioeconomic History   Marital status: Divorced      Spouse name: Not on file   Number of children: 5   Years of education: Not on file   Highest education level: Not on file  Occupational History   Not on file  Tobacco Use   Smoking status: Never      Passive exposure: Never   Smokeless tobacco: Never  Vaping Use   Vaping Use: Never used  Substance and Sexual Activity   Alcohol use: No   Drug use: No   Sexual activity: Not Currently      Birth control/protection: Post-menopausal  Other Topics Concern   Not on file  Social History Narrative    4 living children    Social Determinants of Health    Financial Resource Strain: Not on file  Food Insecurity: Not on file  Transportation Needs: Not on file  Physical Activity: Not on file  Stress: Not on file  Social Connections: Not on file  Intimate Partner Violence: Not on file        PHYSICAL EXAMINATION:      Today's Vitals    10/11/22 1341  BP: 103/69  Pulse: 86  Temp: 97.6 F (36.4 C)  TempSrc: Temporal  SpO2: 98%    There is no height or weight on file to calculate BMI.       General:  WDWN female in NAD Gait: Not observed-in wheelchair HENT: WNL Pulmonary: normal non-labored breathing  Vascular Exam/Pulses:  Easily palpable right radial  pulse Extremities:  motor and sensory in tact right hand.  Incision has healed.  There is an excellent thrill/bruit within the fistula.  Musculoskeletal: no muscle wasting or atrophy       Neurologic: A&O X 3   Non-Invasive Vascular Imaging:   Dialysis duplex on 10/11/2022:  0.55cm-0.61cm diameter     ASSESSMENT/PLAN: 82 y.o. female with ESRD here for evaluation of her hemodialysis access with hx of right 1st stage BVT on 08/27/2022 by Dr. Chestine Spore     -pt's fistula is maturing nicely.  Pt with increased velocity at [facility] fossa-discussed with Dr. Myra Gianotti and will proceed with 2nd stage given the fistula is maturing nicely.  -pt is on dialysis on T/T/S.  Will plan 2nd stage on a non dialysis day with Dr. Chestine Spore.   -discussed with pt that access does not last forever and will need intervention or even new access at some point.  -pt is not on anticoagulation     Doreatha Massed, [facility] [phone]   Clinic MD:   Myra Gianotti

## 2022-12-29 NOTE — Anesthesia Procedure Notes (Addendum)
Procedure Name: LMA Insertion Date/Time: 12/29/2022 10:18 AM  Performed by: Sheppard Evens, CRNAPre-anesthesia Checklist: Patient identified, Emergency Drugs available, Suction available and Patient being monitored Patient Re-evaluated:Patient Re-evaluated prior to induction Oxygen Delivery Method: Circle System Utilized Preoxygenation: Pre-oxygenation with 100% oxygen Induction Type: IV induction Ventilation: Mask ventilation without difficulty LMA: LMA inserted LMA Size: 3.0 Number of attempts: 1 Airway Equipment and Method: Bite block Placement Confirmation: positive ETCO2 Tube secured with: Tape Dental Injury: Teeth and Oropharynx as per pre-operative assessment

## 2022-12-30 ENCOUNTER — Encounter (HOSPITAL_COMMUNITY): Payer: Self-pay | Admitting: Vascular Surgery

## 2023-01-12 ENCOUNTER — Encounter: Payer: Self-pay | Admitting: Podiatry

## 2023-01-12 ENCOUNTER — Ambulatory Visit (INDEPENDENT_AMBULATORY_CARE_PROVIDER_SITE_OTHER): Payer: Medicare HMO | Admitting: Podiatry

## 2023-01-12 DIAGNOSIS — B351 Tinea unguium: Secondary | ICD-10-CM | POA: Diagnosis not present

## 2023-01-12 DIAGNOSIS — E1159 Type 2 diabetes mellitus with other circulatory complications: Secondary | ICD-10-CM | POA: Diagnosis not present

## 2023-01-12 DIAGNOSIS — M79675 Pain in left toe(s): Secondary | ICD-10-CM

## 2023-01-12 DIAGNOSIS — M79674 Pain in right toe(s): Secondary | ICD-10-CM

## 2023-01-12 NOTE — Progress Notes (Signed)
This patient returns to my office for at risk foot care.  This patient requires this care by a professional since this patient will be at risk due to having diabetes and CKD.This patient is unable to cut nails herself since the patient cannot reach her nails.These nails are painful walking and wearing shoes.  This patient presents for at risk foot care today.  General Appearance  Alert, conversant and in no acute stress.  Vascular  Dorsalis pedis and posterior tibial  pulses are weakly  palpable  bilaterally.  Capillary return is within normal limits  bilaterally. Temperature is within normal limits  bilaterally.  Neurologic  Senn-Weinstein monofilament wire test within normal limits  bilaterally. Muscle power within normal limits bilaterally.  Nails Thick disfigured discolored nails with subungual debris  from hallux to fifth toes bilaterally. No evidence of bacterial infection or drainage bilaterally.  Orthopedic  No limitations of motion  feet .  No crepitus or effusions noted.  No bony pathology or digital deformities noted. Pes planus.  End stage pes planus  B.L.  Skin  normotropic skin with no porokeratosis noted bilaterally.  No signs of infections or ulcers noted.     Onychomycosis  Pain in right toes  Pain in left toes  Consent was obtained for treatment procedures.   Mechanical debridement of nails 1-5  bilaterally performed with a nail nipper.  Filed with dremel without incident.    Return office visit   4 months                   Told patient to return for periodic foot care and evaluation due to potential at risk complications.   Helane Gunther DPM

## 2023-02-07 ENCOUNTER — Encounter (HOSPITAL_BASED_OUTPATIENT_CLINIC_OR_DEPARTMENT_OTHER): Payer: Medicare HMO | Attending: Internal Medicine | Admitting: Internal Medicine

## 2023-02-07 DIAGNOSIS — E1151 Type 2 diabetes mellitus with diabetic peripheral angiopathy without gangrene: Secondary | ICD-10-CM | POA: Diagnosis present

## 2023-02-07 DIAGNOSIS — I132 Hypertensive heart and chronic kidney disease with heart failure and with stage 5 chronic kidney disease, or end stage renal disease: Secondary | ICD-10-CM | POA: Insufficient documentation

## 2023-02-07 DIAGNOSIS — I87333 Chronic venous hypertension (idiopathic) with ulcer and inflammation of bilateral lower extremity: Secondary | ICD-10-CM | POA: Insufficient documentation

## 2023-02-07 DIAGNOSIS — L97818 Non-pressure chronic ulcer of other part of right lower leg with other specified severity: Secondary | ICD-10-CM | POA: Insufficient documentation

## 2023-02-07 DIAGNOSIS — E1142 Type 2 diabetes mellitus with diabetic polyneuropathy: Secondary | ICD-10-CM | POA: Diagnosis not present

## 2023-02-07 DIAGNOSIS — E785 Hyperlipidemia, unspecified: Secondary | ICD-10-CM | POA: Diagnosis not present

## 2023-02-07 DIAGNOSIS — I509 Heart failure, unspecified: Secondary | ICD-10-CM | POA: Insufficient documentation

## 2023-02-07 DIAGNOSIS — Z7901 Long term (current) use of anticoagulants: Secondary | ICD-10-CM | POA: Diagnosis not present

## 2023-02-07 DIAGNOSIS — Z794 Long term (current) use of insulin: Secondary | ICD-10-CM | POA: Insufficient documentation

## 2023-02-07 DIAGNOSIS — E1122 Type 2 diabetes mellitus with diabetic chronic kidney disease: Secondary | ICD-10-CM | POA: Insufficient documentation

## 2023-02-07 DIAGNOSIS — Z8249 Family history of ischemic heart disease and other diseases of the circulatory system: Secondary | ICD-10-CM | POA: Diagnosis not present

## 2023-02-07 DIAGNOSIS — E11622 Type 2 diabetes mellitus with other skin ulcer: Secondary | ICD-10-CM | POA: Insufficient documentation

## 2023-02-07 DIAGNOSIS — L97828 Non-pressure chronic ulcer of other part of left lower leg with other specified severity: Secondary | ICD-10-CM | POA: Diagnosis not present

## 2023-02-07 DIAGNOSIS — E1121 Type 2 diabetes mellitus with diabetic nephropathy: Secondary | ICD-10-CM | POA: Diagnosis not present

## 2023-02-07 DIAGNOSIS — N186 End stage renal disease: Secondary | ICD-10-CM | POA: Insufficient documentation

## 2023-02-07 DIAGNOSIS — Z992 Dependence on renal dialysis: Secondary | ICD-10-CM | POA: Insufficient documentation

## 2023-02-08 NOTE — Progress Notes (Signed)
Wrap 4x5 (in/yd) 1 x Per Week/30 Days Discharge Instructions: Apply over Kerlix as directed. Wound #9 - Lower Leg Wound Laterality: Right, Lateral Cleanser: Soap and Water 1 x Per Week/30 Days Discharge Instructions: May shower and wash wound with dial antibacterial soap and water prior to dressing change. Cleanser: Vashe 5.8 (oz) 1 x Per Week/30 Days Discharge Instructions: Cleanse the wound with Vashe prior to applying a clean dressing using gauze sponges, not tissue or cotton balls. Peri-Wound Care: Triamcinolone 15 (g) 1 x Per Week/30 Days Discharge Instructions: Use triamcinolone 15 (g) as directed Peri-Wound Care: Sween Lotion (Moisturizing lotion) 1 x Per Week/30 Days Discharge Instructions: Apply moisturizing lotion as directed Prim Dressing: Hydrofera Blue Ready Transfer Foam, 2.5x2.5 (in/in) 1 x Per Week/30 Days ary Discharge Instructions: Apply directly to wound bed as directed Secondary Dressing: ABD Pad, 8x10 1 x Per Week/30 Days Discharge Instructions: Apply over primary dressing as directed. Compression Wrap: Kerlix Roll 4.5x3.1 (in/yd) 1 x Per Week/30 Days Discharge Instructions: Apply Kerlix and Coban compression as directed. Compression Wrap: Coban Self-Adherent Wrap 4x5 (in/yd) 1 x Per Week/30 Days Discharge Instructions: Apply over Kerlix as directed. Patient Medications llergies: amlodipine A Notifications Medication Indication Start End APPLY ONLY IN 02/07/2023 lidocaine DEBRIDEMENT . DOSE topical 4 % cream - cream topical once daily Electronic Signature(s) [place], [name] ([id number])  [id number] Page 10 of 17 Signed: 02/07/2023 5:17:24 PM By: Shawn Stall RN, BSN Signed: 02/07/2023 5:32:55 PM By: Baltazar Najjar MD Entered By: Shawn Stall on 02/07/2023 07:24:12 -------------------------------------------------------------------------------- Problem List Details Patient Name: Date of Service: MEGA, KINKADE NN 02/07/2023 9:30 A M Medical Record Number: [id number] Patient Account Number: [id number] Date of Birth/Sex: Treating RN: [birth year] (82 y.o. 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 of 02/07/2023 No Yes bilateral lower extremity L97.828 Non-pressure chronic ulcer of other part of left lower leg with other specified 02/07/2023 No Yes severity L97.818 Non-pressure chronic ulcer of other part of right lower leg with other specified 02/07/2023 No Yes severity E11.51 Type 2 diabetes mellitus with diabetic peripheral angiopathy without gangrene 02/07/2023 No Yes 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 F) Primary Care Provider: Norva Riffle Other Clinician: Referring Provider: Treating Provider/Extender: Theodosia Paling in Treatment: 0 [place], [name]  ([id number]) [id number] Page 11 of 17 Subjective Chief Complaint Information obtained from Patient Patient presents for treatment of open ulcers due to venous insufficiency 02/07/2023; patient returns to clinic with wounds on her bilateral lower legs in the setting of chronic venous insufficiency History of Present Illness (HPI) ADMISSION 12/25/2021 This is an 82 year old woman with multiple medical comorbidities including congestive heart failure, hypertension, end-stage renal disease on hemodialysis, polyneuropathy, poorly controlled type 2 diabetes mellitus (last hemoglobin A1c available to me is from December 2022 and was 9.6). She presents to clinic today with multiple open ulcers on her bilateral lower extremities in the setting of severe edema. She says that she has compression stockings but has not worn them in some time  of 17 -------------------------------------------------------------------------------- Debridement Details Patient Name: Date of Service: JUDIANNE, SEIPLE NN 02/07/2023 9:30 A M Medical Record Number: [id number] Patient Account Number: [id number] Date of Birth/Sex: Treating RN: Jun 09, 1940 (82 y.o. F) Primary Care Provider: Norva Riffle Other Clinician: Referring Provider: Treating Provider/Extender: Theodosia Paling in Treatment: 0 Debridement Performed for Assessment: Wound #12 Left,Proximal,Anterior Lower Leg Performed By: Physician Maxwell Caul., MD The following information was scribed by: Shawn Stall The information was scribed for: Baltazar Najjar Debridement Type: Debridement Severity of Tissue Pre Debridement: Limited to breakdown of skin Level of Consciousness (Pre-procedure): Awake and Alert Pre-procedure Verification/Time Out Yes - 10:15 Taken: Start Time: 10:16 Pain Control: Lidocaine 4% T opical Solution Percent of Wound Bed Debrided: 100% T Area Debrided (cm): otal 2.36 Tissue and other material debrided: Viable, Non-Viable, Eschar, Skin: Epidermis Level: Skin/Epidermis Debridement Description:  Selective/Open Wound Instrument: Curette Bleeding: None End Time: 10:22 Procedural Pain: 0 Post Procedural Pain: 0 Response to Treatment: Procedure was tolerated well Level of Consciousness (Post- Awake and Alert procedure): Post Debridement Measurements of Total Wound Length: (cm) 1 Width: (cm) 3 Depth: (cm) 0.1 Volume: (cm) 0.236 Character of Wound/Ulcer Post Debridement: Improved Severity of Tissue Post Debridement: Limited to breakdown of skin Post Procedure Diagnosis Same as Pre-procedure Electronic Signature(s) Signed: 02/07/2023 5:32:55 PM By: Baltazar Najjar MD Entered By: Baltazar Najjar on 02/07/2023 08:00:52 -------------------------------------------------------------------------------- Debridement Details Patient Name: Date of Service: KIANNI, LHEUREUX NN 02/07/2023 9:30 A M Medical Record Number: [id number] Patient Account Number: [id number] Date of Birth/Sex: Treating RN: 11/09/1940 (82 y.o. F) Primary Care Provider: Norva Riffle Other Clinician: Referring Provider: Treating Provider/Extender: Theodosia Paling in Treatment: 0 Debridement Performed for Assessment: Wound #13 Left,Distal,Anterior Lower Leg Performed By: Physician Maxwell Caul., MD The following information was scribed by: Shawn Stall The information was scribed for: [place], [place], [name] ([id number]) [id number] Page 4 of 17 Debridement Type: Debridement Severity of Tissue Pre Debridement: Limited to breakdown of skin Level of Consciousness (Pre-procedure): Awake and Alert Pre-procedure Verification/Time Out Yes - 10:15 Taken: Start Time: 10:16 Pain Control: Lidocaine 4% T opical Solution Percent of Wound Bed Debrided: 100% T Area Debrided (cm): otal 7.06 Tissue and other material debrided: Viable, Non-Viable, Eschar, Skin: Epidermis Level: Skin/Epidermis Debridement Description: Selective/Open Wound Instrument:  Curette Bleeding: None End Time: 10:22 Procedural Pain: 0 Post Procedural Pain: 0 Response to Treatment: Procedure was tolerated well Level of Consciousness (Post- Awake and Alert procedure): Post Debridement Measurements of Total Wound Length: (cm) 6 Width: (cm) 1.5 Depth: (cm) 0.1 Volume: (cm) 0.707 Character of Wound/Ulcer Post Debridement: Improved Severity of Tissue Post Debridement: Limited to breakdown of skin Post Procedure Diagnosis Same as Pre-procedure Electronic Signature(s) Signed: 02/07/2023 5:32:55 PM By: Baltazar Najjar MD Entered By: Baltazar Najjar on 02/07/2023 08:01:03 -------------------------------------------------------------------------------- Debridement Details Patient Name: Date of Service: LUJAIN, KRASZEWSKI NN 02/07/2023 9:30 A M Medical Record Number: [id number] Patient Account Number: [id number] Date of Birth/Sex: Treating RN: 15-Apr-1941 (82 y.o. F) Primary Care Provider: Norva Riffle Other Clinician: Referring Provider: Treating Provider/Extender: Theodosia Paling in Treatment: 0 Debridement Performed for Assessment: Wound #14 Left,Medial Lower Leg Performed By: Physician Maxwell Caul., MD The following information was scribed by: Shawn Stall The information was scribed for: Baltazar Najjar Debridement Type: Debridement Severity of Tissue Pre Debridement: Limited to breakdown of skin Level of Consciousness (Pre-procedure): Awake and Alert Pre-procedure Verification/Time Out Yes - 10:15 Taken: Start Time: 10:16 Pain Control: Lidocaine 4% T opical Solution  Percent of Wound Bed Debrided: 100% T Area Debrided (cm): otal 1.33 Tissue and other material debrided: Viable, Non-Viable, Eschar, Skin: Epidermis Level: Skin/Epidermis Debridement Description: Selective/Open Wound Instrument: Curette Bleeding: None End Time: 10:22 Procedural Pain: 0 Post Procedural Pain: 0 Response to Treatment: Procedure was tolerated  well Level of Consciousness (Post- Awake and Alert procedure): MAJESTI, GAMBRELL ([id number]) [id number] Page 5 of 17 Post Debridement Measurements of Total Wound Length: (cm) 1.7 Width: (cm) 1 Depth: (cm) 0.1 Volume: (cm) 0.134 Character of Wound/Ulcer Post Debridement: Improved Severity of Tissue Post Debridement: Limited to breakdown of skin Post Procedure Diagnosis Same as Pre-procedure Electronic Signature(s) Signed: 02/07/2023 5:32:55 PM By: Baltazar Najjar MD Entered By: Baltazar Najjar on 02/07/2023 08:01:11 -------------------------------------------------------------------------------- Debridement Details Patient Name: Date of Service: NAJMO, PARDUE NN 02/07/2023 9:30 A M Medical Record Number: [id number] Patient Account Number: [id number] Date of Birth/Sex: Treating RN: [birth year] (82 y.o. F) Primary Care Provider: Norva Riffle Other Clinician: Referring Provider: Treating Provider/Extender: Theodosia Paling in Treatment: 0 Debridement Performed for Assessment: Wound #9 Right,Lateral Lower Leg Performed By: Physician Maxwell Caul., MD The following information was scribed by: Shawn Stall The information was scribed for: Baltazar Najjar Debridement Type: Debridement Severity of Tissue Pre Debridement: Limited to breakdown of skin Level of Consciousness (Pre-procedure): Awake and Alert Pre-procedure Verification/Time Out Yes - 10:15 Taken: Start Time: 10:16 Pain Control: Lidocaine 4% T opical Solution Percent of Wound Bed Debrided: 100% T Area Debrided (cm): otal 1.06 Tissue and other material debrided: Viable, Non-Viable, Eschar, Skin: Epidermis Level: Skin/Epidermis Debridement Description: Selective/Open Wound Instrument: Curette Bleeding: None End Time: 10:22 Procedural Pain: 0 Post Procedural Pain: 0 Response to Treatment: Procedure was tolerated well Level of Consciousness (Post- Awake and  Alert procedure): Post Debridement Measurements of Total Wound Length: (cm) 0.9 Width: (cm) 1.5 Depth: (cm) 0.1 Volume: (cm) 0.106 Character of Wound/Ulcer Post Debridement: Improved Severity of Tissue Post Debridement: Limited to breakdown of skin Post Procedure Diagnosis Same as Pre-procedure Electronic Signature(s) Signed: 02/07/2023 5:32:55 PM By: Baltazar Najjar MD Entered By: Baltazar Najjar on 02/07/2023 08:01:21 [name] ([id number]) [id number] Page 6 of 17 -------------------------------------------------------------------------------- HPI Details Patient Name: Date of Service: KAILEENA, OBI NN 02/07/2023 9:30 A M Medical Record Number: [id number] Patient Account Number: [id number] Date of Birth/Sex: Treating RN: Jan 18, 1941 (82 y.o. F) Primary Care Provider: Norva Riffle Other Clinician: Referring Provider: Treating Provider/Extender: Theodosia Paling in Treatment: 0 History of Present Illness HPI Description: ADMISSION 12/25/2021 This is an 82 year old woman with multiple medical comorbidities including congestive heart failure, hypertension, end-stage renal disease on hemodialysis, polyneuropathy, poorly controlled type 2 diabetes mellitus (last hemoglobin A1c available to me is from December 2022 and was 9.6). She presents to clinic today with multiple open ulcers on her bilateral lower extremities in the setting of severe edema. She says that she has compression stockings but has not worn them in some time because they are too tight. Her wounds are secondary to trauma from her wheelchair. She often sleeps in her wheelchair with her legs hanging down. ABIs in clinic today were noncompressible on the right and 0.57 on the left. She has multiple shallow ulcers on the bilateral lower extremities. She has 2+ pitting edema and her legs are extremely tense. They are somewhat erythematous and warm. She is currently taking  cephalexin prescribed by her primary care provider due to concern for cellulitis. 01/01/2022: All of her wounds are healed. Edema control with the Kerlix and Coban wraps is excellent.  for: Type I Diabetes Time with diabetes: approx 6 yrs Treated with: Insulin Blood sugar tested every day: Yes Tested : 2 times per day Genitourinary Medical History: Positive for: End Stage Renal Disease - HemoDialysis Integumentary (Skin) Medical History: Negative for: History of Burn Neurologic Medical History: Positive for: Neuropathy Oncologic Medical History: Negative for: Received Chemotherapy; Received  Radiation Psychiatric Medical History: Negative for: Anorexia/bulimia; Confinement Anxiety HBO Extended History Items Eyes: Cataracts Immunizations Pneumococcal Vaccine: Received Pneumococcal Vaccination: No Implantable Devices Yes Hospitalization / Surgery History Type of Hospitalization/Surgery AV fistula plaement left arm tonsillectomy 08/27/22 AV fistula placement right Family and Social History Cancer: No; Diabetes: No; Heart Disease: Yes - Mother,Father; Hereditary Spherocytosis: No; Hypertension: Yes - Mother,Father; Kidney Disease: No; Lung Disease: No; Seizures: No; Stroke: No; Thyroid Problems: No; Tuberculosis: No; Never smoker; Marital Status - Divorced; Alcohol Use: Never; Drug Use: No History; Caffeine Use: Daily; Financial Concerns: No; Food, Clothing or Shelter Needs: No; Support System Lacking: No; Transportation Concerns: No Psychologist, prison and probation services) Signed: 02/07/2023 5:17:24 PM By: Shawn Stall RN, BSN Signed: 02/07/2023 5:32:55 PM By: Baltazar Najjar MD Entered By: Shawn Stall on 02/07/2023 05:27:42 SuperBill Details -------------------------------------------------------------------------------- [name] ([id number]) [id number] Page 17 of 17 Patient Name: Date of Service: HALCYON, HECK NN 02/07/2023 Medical Record Number: [id number] Patient Account Number: [id number] Date of Birth/Sex: Treating RN: [birth year] (82 y.o. 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 Facility Procedures CPT4 Code Description Modifier Quantity [id number] [phone] - DEBRIDE WOUND 1ST 20 SQ CM OR < 1 ICD-10 Diagnosis Description L97.828 Non-pressure chronic ulcer of other part of left lower leg with other specified severity L97.818 Non-pressure chronic ulcer of other part of right lower leg with other specified severity [id number] - DEBRIDE WOUND EA ADDL 20 SQ CM 1 ICD-10 Diagnosis Description L97.828 Non-pressure chronic ulcer of other part of left lower leg with other specified severity L97.818 Non-pressure chronic ulcer of other part of right lower leg with other specified severity Physician Procedures Quantity CPT4 Code Description Modifier [id number] - WC PHYS LEVEL 4 - EST PT 25 1 ICD-10 Diagnosis Description 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 L97.828 Non-pressure chronic ulcer of other part of left lower leg with other specified severity L97.818 Non-pressure chronic ulcer of other part of right lower leg with other specified severity E11.51 Type 2 diabetes mellitus with diabetic peripheral angiopathy without gangrene [id number] [id number] - WC PHYS DEBR WO ANESTH 20 SQ CM 1 ICD-10 Diagnosis Description L97.828 Non-pressure chronic ulcer of other part of left lower leg with other specified severity L97.818 Non-pressure chronic ulcer of other part of right lower leg with other specified severity [id number] 97598 - WC PHYS DEBR WO ANESTH EA ADD 20 CM 1 ICD-10 Diagnosis Description L97.828 Non-pressure chronic ulcer of other part of left lower leg with other specified severity L97.818 Non-pressure chronic ulcer of other part of right lower leg with other specified severity Electronic Signature(s) Signed: 02/07/2023 5:32:55 PM By: Baltazar Najjar  MD Entered By: Baltazar Najjar  femoral but not the right. . Notes There is minimal to no edema. Dry flaking xerotic skin. Wound exam; all the wounds are covered in surface eschar. I removed this painstakingly the wounds are superficial. Small areas on the right lateral right medial. More substantially on the left anterior. All of these are superficial wounds there is no evidence of infection Electronic Signature(s) Signed: 02/07/2023 5:32:55 PM By: Baltazar Najjar MD Entered By: Baltazar Najjar on 02/07/2023 08:06:26 -------------------------------------------------------------------------------- Physician Orders Details Patient Name: Date of Service: JONTAVIA, [place] NN 02/07/2023 9:30 A M Medical Record Number: [id number] Patient Account Number: [id number] Date of Birth/Sex: Treating RN: 22-Feb-1941 (82 y.o. [name], [name] Primary Care Provider: Norva Riffle Other Clinician: Referring Provider: Treating Provider/Extender: Theodosia Paling in Treatment: 0 The following information was scribed by: Shawn Stall The information was scribed for: Baltazar Najjar Verbal / Phone Orders: No Diagnosis Coding Follow-up Appointments ppointment in 1 week. - Dr. Leanord Hawking 0800 Monday room 8 02/14/2023 Return A ppointment in 2 weeks. - Dr. Leanord Hawking 2pm Monday room 8 02/21/2023 Return A Return appointment in 3 weeks. - Dr. Leanord Hawking (front office to schedule) Anesthetic (In clinic) Topical Lidocaine 4% applied to wound bed Bathing/ Shower/ Hygiene May shower with protection but do not get wound dressing(s) wet. Protect dressing(s) with water repellant cover (for example, large plastic bag) or a cast cover and may then take shower. Edema Control - Lymphedema / SCD / Other Elevate legs to the level of the heart or above for 30 minutes daily and/or when sitting for 3-4 times a day throughout the  day. Avoid standing for long periods of time. Wound Treatment Wound #10 - Lower Leg Wound Laterality: Right, Medial Cleanser: Soap and Water 1 x Per Week/30 Days Discharge Instructions: May shower and wash wound with dial antibacterial soap and water prior to dressing change. Cleanser: Vashe 5.8 (oz) 1 x Per Week/30 Days Discharge Instructions: Cleanse the wound with Vashe prior to applying a clean dressing using gauze sponges, not tissue or cotton balls. Peri-Wound Care: Triamcinolone 15 (g) 1 x Per Week/30 Days Discharge Instructions: Use triamcinolone 15 (g) as directed Peri-Wound Care: Sween Lotion (Moisturizing lotion) 1 x Per Week/30 Days [place], [name] ([id number]) [id number] Page 8 of 17 Discharge Instructions: Apply moisturizing lotion as directed Prim Dressing: Hydrofera Blue Ready Transfer Foam, 2.5x2.5 (in/in) 1 x Per Week/30 Days ary Discharge Instructions: Apply directly to wound bed as directed Secondary Dressing: ABD Pad, 8x10 1 x Per Week/30 Days Discharge Instructions: Apply over primary dressing as directed. Compression Wrap: Kerlix Roll 4.5x3.1 (in/yd) 1 x Per Week/30 Days Discharge Instructions: Apply Kerlix and Coban compression as directed. Compression Wrap: Coban Self-Adherent Wrap 4x5 (in/yd) 1 x Per Week/30 Days Discharge Instructions: Apply over Kerlix as directed. Wound #11 - Lower Leg Wound Laterality: Left, Lateral Cleanser: Soap and Water 1 x Per Week/30 Days Discharge Instructions: May shower and wash wound with dial antibacterial soap and water prior to dressing change. Cleanser: Vashe 5.8 (oz) 1 x Per Week/30 Days Discharge Instructions: Cleanse the wound with Vashe prior to applying a clean dressing using gauze sponges, not tissue or cotton balls. Peri-Wound Care: Triamcinolone 15 (g) 1 x Per Week/30 Days Discharge Instructions: Use triamcinolone 15 (g) as directed Peri-Wound Care: Sween Lotion (Moisturizing lotion) 1 x Per  Week/30 Days Discharge Instructions: Apply moisturizing lotion as directed Prim Dressing: Hydrofera Blue Ready Transfer Foam, 2.5x2.5 (in/in) 1 x Per Week/30 Days ary Discharge Instructions: Apply directly to wound bed as directed  Per Week/30 Days Discharge Instructions: May shower and wash wound with dial antibacterial soap and water prior to dressing change. Cleanser: Vashe 5.8 (oz) 1 x Per Week/30 Days Discharge Instructions: Cleanse the wound with Vashe prior to applying a clean dressing using gauze sponges, not tissue or cotton balls. Peri-Wound Care: Triamcinolone 15 (g) 1 x Per Week/30 Days Discharge Instructions: Use triamcinolone 15 (g) as directed Peri-Wound Care: Sween Lotion (Moisturizing lotion) 1 x Per Week/30 Days [place], [name] ([id number]) [id number] Page 15 of 17 Discharge Instructions: Apply moisturizing lotion as directed Prim Dressing: Hydrofera Blue Ready Transfer Foam, 2.5x2.5 (in/in) 1 x Per Week/30 Days ary Discharge Instructions: Apply  directly to wound bed as directed Secondary Dressing: ABD Pad, 8x10 1 x Per Week/30 Days Discharge Instructions: Apply over primary dressing as directed. Com pression Wrap: Kerlix Roll 4.5x3.1 (in/yd) 1 x Per Week/30 Days Discharge Instructions: Apply Kerlix and Coban compression as directed. Com pression Wrap: Coban Self-Adherent Wrap 4x5 (in/yd) 1 x Per Week/30 Days Discharge Instructions: Apply over Kerlix as directed. WOUND #9: - Lower Leg Wound Laterality: Right, Lateral Cleanser: Soap and Water 1 x Per Week/30 Days Discharge Instructions: May shower and wash wound with dial antibacterial soap and water prior to dressing change. Cleanser: Vashe 5.8 (oz) 1 x Per Week/30 Days Discharge Instructions: Cleanse the wound with Vashe prior to applying a clean dressing using gauze sponges, not tissue or cotton balls. Peri-Wound Care: Triamcinolone 15 (g) 1 x Per Week/30 Days Discharge Instructions: Use triamcinolone 15 (g) as directed Peri-Wound Care: Sween Lotion (Moisturizing lotion) 1 x Per Week/30 Days Discharge Instructions: Apply moisturizing lotion as directed Prim Dressing: Hydrofera Blue Ready Transfer Foam, 2.5x2.5 (in/in) 1 x Per Week/30 Days ary Discharge Instructions: Apply directly to wound bed as directed Secondary Dressing: ABD Pad, 8x10 1 x Per Week/30 Days Discharge Instructions: Apply over primary dressing as directed. Com pression Wrap: Kerlix Roll 4.5x3.1 (in/yd) 1 x Per Week/30 Days Discharge Instructions: Apply Kerlix and Coban compression as directed. Com pression Wrap: Coban Self-Adherent Wrap 4x5 (in/yd) 1 x Per Week/30 Days Discharge Instructions: Apply over Kerlix as directed. 1. The patient has chronic venous insufficiency and recurrent wounds. She does not have any blisters currently and does not have systemic fluid volume overload 2. Unfortunately I think she probably has significant PAD as well 3. We are going to use TCA moisturizer Hydrofera Blue and only  kerlix Coban wrap because of the likelihood of significant PAD/tibial artery disease. She does not complain of claudication that I could determine historically 4. If these wounds do not heal or she develops other wounds we probably will have to do a full arterial evaluation. Electronic Signature(s) Signed: 02/07/2023 5:32:55 PM By: Baltazar Najjar MD Entered By: Baltazar Najjar on 02/07/2023 32:44:01 -------------------------------------------------------------------------------- HxROS Details Patient Name: Date of Service: KAREEMAH, GROUNDS NN 02/07/2023 9:30 A M Medical Record Number: [id number] Patient Account Number: [id number] Date of Birth/Sex: Treating RN: Apr 03, 1941 (82 y.o. [name] Primary Care Provider: Norva Riffle Other Clinician: Referring Provider: Treating Provider/Extender: Theodosia Paling in Treatment: 0 Information Obtained From Patient Caregiver Chart Constitutional Symptoms (General Health) Medical History: Past Medical History Notes: vitamin D deficiency Eyes Medical History: Positive for: Cataracts - bil removed Negative for: Glaucoma; Optic Neuritis Hematologic/Lymphatic Medical History: Negative for: Anemia Cardiovascular Medical History: Positive for: Arrhythmia - afib; Congestive Heart Failure; Hypertension LIANI, CARIS ([id number]) [id number] Page 16 of 17 Past Medical History Notes: hyperlipidemia Endocrine Medical History: Positive for: Type II Diabetes Negative  for: Type I Diabetes Time with diabetes: approx 6 yrs Treated with: Insulin Blood sugar tested every day: Yes Tested : 2 times per day Genitourinary Medical History: Positive for: End Stage Renal Disease - HemoDialysis Integumentary (Skin) Medical History: Negative for: History of Burn Neurologic Medical History: Positive for: Neuropathy Oncologic Medical History: Negative for: Received Chemotherapy; Received  Radiation Psychiatric Medical History: Negative for: Anorexia/bulimia; Confinement Anxiety HBO Extended History Items Eyes: Cataracts Immunizations Pneumococcal Vaccine: Received Pneumococcal Vaccination: No Implantable Devices Yes Hospitalization / Surgery History Type of Hospitalization/Surgery AV fistula plaement left arm tonsillectomy 08/27/22 AV fistula placement right Family and Social History Cancer: No; Diabetes: No; Heart Disease: Yes - Mother,Father; Hereditary Spherocytosis: No; Hypertension: Yes - Mother,Father; Kidney Disease: No; Lung Disease: No; Seizures: No; Stroke: No; Thyroid Problems: No; Tuberculosis: No; Never smoker; Marital Status - Divorced; Alcohol Use: Never; Drug Use: No History; Caffeine Use: Daily; Financial Concerns: No; Food, Clothing or Shelter Needs: No; Support System Lacking: No; Transportation Concerns: No Psychologist, prison and probation services) Signed: 02/07/2023 5:17:24 PM By: Shawn Stall RN, BSN Signed: 02/07/2023 5:32:55 PM By: Baltazar Najjar MD Entered By: Shawn Stall on 02/07/2023 05:27:42 SuperBill Details -------------------------------------------------------------------------------- [name] ([id number]) [id number] Page 17 of 17 Patient Name: Date of Service: HALCYON, HECK NN 02/07/2023 Medical Record Number: [id number] Patient Account Number: [id number] Date of Birth/Sex: Treating RN: [birth year] (82 y.o. 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 Facility Procedures CPT4 Code Description Modifier Quantity [id number] [phone] - DEBRIDE WOUND 1ST 20 SQ CM OR < 1 ICD-10 Diagnosis Description L97.828 Non-pressure chronic ulcer of other part of left lower leg with other specified severity L97.818 Non-pressure chronic ulcer of other part of right lower leg with other specified severity [id number] - DEBRIDE WOUND EA ADDL 20 SQ CM 1 ICD-10 Diagnosis Description L97.828 Non-pressure chronic ulcer of other part of left lower leg with other specified severity L97.818 Non-pressure chronic ulcer of other part of right lower leg with other specified severity Physician Procedures Quantity CPT4 Code Description Modifier [id number] - WC PHYS LEVEL 4 - EST PT 25 1 ICD-10 Diagnosis Description 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 L97.828 Non-pressure chronic ulcer of other part of left lower leg with other specified severity L97.818 Non-pressure chronic ulcer of other part of right lower leg with other specified severity E11.51 Type 2 diabetes mellitus with diabetic peripheral angiopathy without gangrene [id number] 97597 - WC PHYS DEBR WO ANESTH 20 SQ CM 1 ICD-10 Diagnosis Description L97.828 Non-pressure chronic ulcer of other part of left lower leg with other specified severity L97.818 Non-pressure chronic ulcer of other part of right lower leg with other specified severity [id number] 97598 - WC PHYS DEBR WO ANESTH EA ADD 20 CM 1 ICD-10 Diagnosis Description L97.828 Non-pressure chronic ulcer of other part of left lower leg with other specified severity L97.818 Non-pressure chronic ulcer of other part of right lower leg with other specified severity Electronic Signature(s) Signed: 02/07/2023 5:32:55 PM By: Baltazar Najjar  MD Entered By: Baltazar Najjar  LILIANNE, DELAIR ([id number]) [id number] Page 1 of 17 Visit Report for 02/07/2023 Chief Complaint Document Details Patient Name: Date of Service: MADELLINE, ESHBACH NN 02/07/2023 9:30 A M Medical Record Number: [id number] Patient Account Number: [id number] Date of Birth/Sex: Treating RN: Mar 21, 1941 (82 y.o. F) Primary Care Provider: Norva Riffle Other Clinician: Referring Provider: Treating Provider/Extender: Theodosia Paling in Treatment: 0 Information Obtained from: Patient Chief Complaint Patient presents for treatment of open ulcers due to venous insufficiency 02/07/2023; patient returns to clinic with wounds on her bilateral lower legs in the setting of chronic venous insufficiency Electronic Signature(s) Signed: 02/07/2023 5:32:55 PM By: Baltazar Najjar MD Entered By: Baltazar Najjar on 02/07/2023 08:01:52 -------------------------------------------------------------------------------- Debridement Details Patient Name: Date of Service: TAYA, ASHBAUGH NN 02/07/2023 9:30 A M Medical Record Number: [id number] Patient Account Number: [id number] Date of Birth/Sex: Treating RN: July 14, 1940 (82 y.o. F) Primary Care Provider: Norva Riffle Other Clinician: Referring Provider: Treating Provider/Extender: Theodosia Paling in Treatment: 0 Debridement Performed for Assessment: Wound #10 Right,Medial Lower Leg Performed By: Physician Maxwell Caul., MD The following information was scribed by: Shawn Stall The information was scribed for: Baltazar Najjar Debridement Type: Debridement Severity of Tissue Pre Debridement: Limited to breakdown of skin Level of Consciousness (Pre-procedure): Awake and Alert Pre-procedure Verification/Time Out Yes - 10:15 Taken: Start Time: 10:16 Pain Control: Lidocaine 4% T opical Solution Percent of Wound Bed Debrided: 100% T Area Debrided (cm): otal 2.2 Tissue and other material  debrided: Viable, Non-Viable, Eschar, Skin: Epidermis Level: Skin/Epidermis Debridement Description: Selective/Open Wound Instrument: Curette Bleeding: None End Time: 10:22 Procedural Pain: 0 Post Procedural Pain: 0 Response to Treatment: Procedure was tolerated well Level of Consciousness (Post- Awake and Alert procedure): Post Debridement Measurements of Total Wound Length: (cm) 2 Width: (cm) 1.4 Depth: (cm) 0.1 [place], [name] ([id number]) [id number] Page 2 of 17 Volume: (cm) 0.22 Character of Wound/Ulcer Post Debridement: Improved Severity of Tissue Post Debridement: Fat layer exposed Post Procedure Diagnosis Same as Pre-procedure Electronic Signature(s) Signed: 02/07/2023 5:32:55 PM By: Baltazar Najjar MD Entered By: Baltazar Najjar on 02/07/2023 08:00:33 -------------------------------------------------------------------------------- Debridement Details Patient Name: Date of Service: CESILY, CUOCO NN 02/07/2023 9:30 A M Medical Record Number: [id number] Patient Account Number: [id number] Date of Birth/Sex: Treating RN: 02-22-1941 (82 y.o. F) Primary Care Provider: Norva Riffle Other Clinician: Referring Provider: Treating Provider/Extender: Theodosia Paling in Treatment: 0 Debridement Performed for Assessment: Wound #11 Left,Lateral Lower Leg Performed By: Physician Maxwell Caul., MD The following information was scribed by: Shawn Stall The information was scribed for: Baltazar Najjar Debridement Type: Debridement Severity of Tissue Pre Debridement: Limited to breakdown of skin Level of Consciousness (Pre-procedure): Awake and Alert Pre-procedure Verification/Time Out Yes - 10:15 Taken: Start Time: 10:16 Pain Control: Lidocaine 4% T opical Solution Percent of Wound Bed Debrided: 100% T Area Debrided (cm): otal 7.91 Tissue and other material debrided: Viable, Non-Viable, Eschar, Skin: Epidermis Level:  Skin/Epidermis Debridement Description: Selective/Open Wound Instrument: Curette Bleeding: None End Time: 10:22 Procedural Pain: 0 Post Procedural Pain: 0 Response to Treatment: Procedure was tolerated well Level of Consciousness (Post- Awake and Alert procedure): Post Debridement Measurements of Total Wound Length: (cm) 2.4 Width: (cm) 4.2 Depth: (cm) 0.1 Volume: (cm) 0.792 Character of Wound/Ulcer Post Debridement: Improved Severity of Tissue Post Debridement: Limited to breakdown of skin Post Procedure Diagnosis Same as Pre-procedure Electronic Signature(s) Signed: 02/07/2023 5:32:55 PM By: Baltazar Najjar MD Entered By: Baltazar Najjar on 02/07/2023 08:00:43 [name] ([id number]) [id number] Page 3  for: Type I Diabetes Time with diabetes: approx 6 yrs Treated with: Insulin Blood sugar tested every day: Yes Tested : 2 times per day Genitourinary Medical History: Positive for: End Stage Renal Disease - HemoDialysis Integumentary (Skin) Medical History: Negative for: History of Burn Neurologic Medical History: Positive for: Neuropathy Oncologic Medical History: Negative for: Received Chemotherapy; Received  Radiation Psychiatric Medical History: Negative for: Anorexia/bulimia; Confinement Anxiety HBO Extended History Items Eyes: Cataracts Immunizations Pneumococcal Vaccine: Received Pneumococcal Vaccination: No Implantable Devices Yes Hospitalization / Surgery History Type of Hospitalization/Surgery AV fistula plaement left arm tonsillectomy 08/27/22 AV fistula placement right Family and Social History Cancer: No; Diabetes: No; Heart Disease: Yes - Mother,Father; Hereditary Spherocytosis: No; Hypertension: Yes - Mother,Father; Kidney Disease: No; Lung Disease: No; Seizures: No; Stroke: No; Thyroid Problems: No; Tuberculosis: No; Never smoker; Marital Status - Divorced; Alcohol Use: Never; Drug Use: No History; Caffeine Use: Daily; Financial Concerns: No; Food, Clothing or Shelter Needs: No; Support System Lacking: No; Transportation Concerns: No Psychologist, prison and probation services) Signed: 02/07/2023 5:17:24 PM By: Shawn Stall RN, BSN Signed: 02/07/2023 5:32:55 PM By: Baltazar Najjar MD Entered By: Shawn Stall on 02/07/2023 05:27:42 SuperBill Details -------------------------------------------------------------------------------- [name] ([id number]) [id number] Page 17 of 17 Patient Name: Date of Service: HALCYON, HECK NN 02/07/2023 Medical Record Number: [id number] Patient Account Number: [id number] Date of Birth/Sex: Treating RN: [birth year] (82 y.o. 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 Facility Procedures CPT4 Code Description Modifier Quantity [id number] [phone] - DEBRIDE WOUND 1ST 20 SQ CM OR < 1 ICD-10 Diagnosis Description L97.828 Non-pressure chronic ulcer of other part of left lower leg with other specified severity L97.818 Non-pressure chronic ulcer of other part of right lower leg with other specified severity [id number] - DEBRIDE WOUND EA ADDL 20 SQ CM 1 ICD-10 Diagnosis Description L97.828 Non-pressure chronic ulcer of other part of left lower leg with other specified severity L97.818 Non-pressure chronic ulcer of other part of right lower leg with other specified severity Physician Procedures Quantity CPT4 Code Description Modifier [id number] 99214 - WC PHYS LEVEL 4 - EST PT 25 1 ICD-10 Diagnosis Description 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 L97.828 Non-pressure chronic ulcer of other part of left lower leg with other specified severity L97.818 Non-pressure chronic ulcer of other part of right lower leg with other specified severity E11.51 Type 2 diabetes mellitus with diabetic peripheral angiopathy without gangrene [id number] 97597 - WC PHYS DEBR WO ANESTH 20 SQ CM 1 ICD-10 Diagnosis Description L97.828 Non-pressure chronic ulcer of other part of left lower leg with other specified severity L97.818 Non-pressure chronic ulcer of other part of right lower leg with other specified severity [id number] 97598 - WC PHYS DEBR WO ANESTH EA ADD 20 CM 1 ICD-10 Diagnosis Description L97.828 Non-pressure chronic ulcer of other part of left lower leg with other specified severity L97.818 Non-pressure chronic ulcer of other part of right lower leg with other specified severity Electronic Signature(s) Signed: 02/07/2023 5:32:55 PM By: Baltazar Najjar  MD Entered By: Baltazar Najjar  Percent of Wound Bed Debrided: 100% T Area Debrided (cm): otal 1.33 Tissue and other material debrided: Viable, Non-Viable, Eschar, Skin: Epidermis Level: Skin/Epidermis Debridement Description: Selective/Open Wound Instrument: Curette Bleeding: None End Time: 10:22 Procedural Pain: 0 Post Procedural Pain: 0 Response to Treatment: Procedure was tolerated  well Level of Consciousness (Post- Awake and Alert procedure): MAJESTI, GAMBRELL ([id number]) [id number] Page 5 of 17 Post Debridement Measurements of Total Wound Length: (cm) 1.7 Width: (cm) 1 Depth: (cm) 0.1 Volume: (cm) 0.134 Character of Wound/Ulcer Post Debridement: Improved Severity of Tissue Post Debridement: Limited to breakdown of skin Post Procedure Diagnosis Same as Pre-procedure Electronic Signature(s) Signed: 02/07/2023 5:32:55 PM By: Baltazar Najjar MD Entered By: Baltazar Najjar on 02/07/2023 08:01:11 -------------------------------------------------------------------------------- Debridement Details Patient Name: Date of Service: NAJMO, PARDUE NN 02/07/2023 9:30 A M Medical Record Number: [id number] Patient Account Number: [id number] Date of Birth/Sex: Treating RN: [birth year] (82 y.o. F) Primary Care Provider: Norva Riffle Other Clinician: Referring Provider: Treating Provider/Extender: Theodosia Paling in Treatment: 0 Debridement Performed for Assessment: Wound #9 Right,Lateral Lower Leg Performed By: Physician Maxwell Caul., MD The following information was scribed by: Shawn Stall The information was scribed for: Baltazar Najjar Debridement Type: Debridement Severity of Tissue Pre Debridement: Limited to breakdown of skin Level of Consciousness (Pre-procedure): Awake and Alert Pre-procedure Verification/Time Out Yes - 10:15 Taken: Start Time: 10:16 Pain Control: Lidocaine 4% T opical Solution Percent of Wound Bed Debrided: 100% T Area Debrided (cm): otal 1.06 Tissue and other material debrided: Viable, Non-Viable, Eschar, Skin: Epidermis Level: Skin/Epidermis Debridement Description: Selective/Open Wound Instrument: Curette Bleeding: None End Time: 10:22 Procedural Pain: 0 Post Procedural Pain: 0 Response to Treatment: Procedure was tolerated well Level of Consciousness (Post- Awake and  Alert procedure): Post Debridement Measurements of Total Wound Length: (cm) 0.9 Width: (cm) 1.5 Depth: (cm) 0.1 Volume: (cm) 0.106 Character of Wound/Ulcer Post Debridement: Improved Severity of Tissue Post Debridement: Limited to breakdown of skin Post Procedure Diagnosis Same as Pre-procedure Electronic Signature(s) Signed: 02/07/2023 5:32:55 PM By: Baltazar Najjar MD Entered By: Baltazar Najjar on 02/07/2023 08:01:21 [name] ([id number]) [id number] Page 6 of 17 -------------------------------------------------------------------------------- HPI Details Patient Name: Date of Service: KAILEENA, OBI NN 02/07/2023 9:30 A M Medical Record Number: [id number] Patient Account Number: [id number] Date of Birth/Sex: Treating RN: Jan 18, 1941 (82 y.o. F) Primary Care Provider: Norva Riffle Other Clinician: Referring Provider: Treating Provider/Extender: Theodosia Paling in Treatment: 0 History of Present Illness HPI Description: ADMISSION 12/25/2021 This is an 82 year old woman with multiple medical comorbidities including congestive heart failure, hypertension, end-stage renal disease on hemodialysis, polyneuropathy, poorly controlled type 2 diabetes mellitus (last hemoglobin A1c available to me is from December 2022 and was 9.6). She presents to clinic today with multiple open ulcers on her bilateral lower extremities in the setting of severe edema. She says that she has compression stockings but has not worn them in some time because they are too tight. Her wounds are secondary to trauma from her wheelchair. She often sleeps in her wheelchair with her legs hanging down. ABIs in clinic today were noncompressible on the right and 0.57 on the left. She has multiple shallow ulcers on the bilateral lower extremities. She has 2+ pitting edema and her legs are extremely tense. They are somewhat erythematous and warm. She is currently taking  cephalexin prescribed by her primary care provider due to concern for cellulitis. 01/01/2022: All of her wounds are healed. Edema control with the Kerlix and Coban wraps is excellent.  Wrap 4x5 (in/yd) 1 x Per Week/30 Days Discharge Instructions: Apply over Kerlix as directed. Wound #9 - Lower Leg Wound Laterality: Right, Lateral Cleanser: Soap and Water 1 x Per Week/30 Days Discharge Instructions: May shower and wash wound with dial antibacterial soap and water prior to dressing change. Cleanser: Vashe 5.8 (oz) 1 x Per Week/30 Days Discharge Instructions: Cleanse the wound with Vashe prior to applying a clean dressing using gauze sponges, not tissue or cotton balls. Peri-Wound Care: Triamcinolone 15 (g) 1 x Per Week/30 Days Discharge Instructions: Use triamcinolone 15 (g) as directed Peri-Wound Care: Sween Lotion (Moisturizing lotion) 1 x Per Week/30 Days Discharge Instructions: Apply moisturizing lotion as directed Prim Dressing: Hydrofera Blue Ready Transfer Foam, 2.5x2.5 (in/in) 1 x Per Week/30 Days ary Discharge Instructions: Apply directly to wound bed as directed Secondary Dressing: ABD Pad, 8x10 1 x Per Week/30 Days Discharge Instructions: Apply over primary dressing as directed. Compression Wrap: Kerlix Roll 4.5x3.1 (in/yd) 1 x Per Week/30 Days Discharge Instructions: Apply Kerlix and Coban compression as directed. Compression Wrap: Coban Self-Adherent Wrap 4x5 (in/yd) 1 x Per Week/30 Days Discharge Instructions: Apply over Kerlix as directed. Patient Medications llergies: amlodipine A Notifications Medication Indication Start End APPLY ONLY IN 02/07/2023 lidocaine DEBRIDEMENT . DOSE topical 4 % cream - cream topical once daily Electronic Signature(s) [place], [name] ([id number])  [id number] Page 10 of 17 Signed: 02/07/2023 5:17:24 PM By: Shawn Stall RN, BSN Signed: 02/07/2023 5:32:55 PM By: Baltazar Najjar MD Entered By: Shawn Stall on 02/07/2023 07:24:12 -------------------------------------------------------------------------------- Problem List Details Patient Name: Date of Service: MEGA, KINKADE NN 02/07/2023 9:30 A M Medical Record Number: [id number] Patient Account Number: [id number] Date of Birth/Sex: Treating RN: [birth year] (82 y.o. 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 of 02/07/2023 No Yes bilateral lower extremity L97.828 Non-pressure chronic ulcer of other part of left lower leg with other specified 02/07/2023 No Yes severity L97.818 Non-pressure chronic ulcer of other part of right lower leg with other specified 02/07/2023 No Yes severity E11.51 Type 2 diabetes mellitus with diabetic peripheral angiopathy without gangrene 02/07/2023 No Yes 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 F) Primary Care Provider: Norva Riffle Other Clinician: Referring Provider: Treating Provider/Extender: Theodosia Paling in Treatment: 0 [place], [name]  ([id number]) [id number] Page 11 of 17 Subjective Chief Complaint Information obtained from Patient Patient presents for treatment of open ulcers due to venous insufficiency 02/07/2023; patient returns to clinic with wounds on her bilateral lower legs in the setting of chronic venous insufficiency History of Present Illness (HPI) ADMISSION 12/25/2021 This is an 82 year old woman with multiple medical comorbidities including congestive heart failure, hypertension, end-stage renal disease on hemodialysis, polyneuropathy, poorly controlled type 2 diabetes mellitus (last hemoglobin A1c available to me is from December 2022 and was 9.6). She presents to clinic today with multiple open ulcers on her bilateral lower extremities in the setting of severe edema. She says that she has compression stockings but has not worn them in some time  LILIANNE, DELAIR ([id number]) [id number] Page 1 of 17 Visit Report for 02/07/2023 Chief Complaint Document Details Patient Name: Date of Service: MADELLINE, ESHBACH NN 02/07/2023 9:30 A M Medical Record Number: [id number] Patient Account Number: [id number] Date of Birth/Sex: Treating RN: Mar 21, 1941 (82 y.o. F) Primary Care Provider: Norva Riffle Other Clinician: Referring Provider: Treating Provider/Extender: Theodosia Paling in Treatment: 0 Information Obtained from: Patient Chief Complaint Patient presents for treatment of open ulcers due to venous insufficiency 02/07/2023; patient returns to clinic with wounds on her bilateral lower legs in the setting of chronic venous insufficiency Electronic Signature(s) Signed: 02/07/2023 5:32:55 PM By: Baltazar Najjar MD Entered By: Baltazar Najjar on 02/07/2023 08:01:52 -------------------------------------------------------------------------------- Debridement Details Patient Name: Date of Service: TAYA, ASHBAUGH NN 02/07/2023 9:30 A M Medical Record Number: [id number] Patient Account Number: [id number] Date of Birth/Sex: Treating RN: July 14, 1940 (82 y.o. F) Primary Care Provider: Norva Riffle Other Clinician: Referring Provider: Treating Provider/Extender: Theodosia Paling in Treatment: 0 Debridement Performed for Assessment: Wound #10 Right,Medial Lower Leg Performed By: Physician Maxwell Caul., MD The following information was scribed by: Shawn Stall The information was scribed for: Baltazar Najjar Debridement Type: Debridement Severity of Tissue Pre Debridement: Limited to breakdown of skin Level of Consciousness (Pre-procedure): Awake and Alert Pre-procedure Verification/Time Out Yes - 10:15 Taken: Start Time: 10:16 Pain Control: Lidocaine 4% T opical Solution Percent of Wound Bed Debrided: 100% T Area Debrided (cm): otal 2.2 Tissue and other material  debrided: Viable, Non-Viable, Eschar, Skin: Epidermis Level: Skin/Epidermis Debridement Description: Selective/Open Wound Instrument: Curette Bleeding: None End Time: 10:22 Procedural Pain: 0 Post Procedural Pain: 0 Response to Treatment: Procedure was tolerated well Level of Consciousness (Post- Awake and Alert procedure): Post Debridement Measurements of Total Wound Length: (cm) 2 Width: (cm) 1.4 Depth: (cm) 0.1 [place], [name] ([id number]) [id number] Page 2 of 17 Volume: (cm) 0.22 Character of Wound/Ulcer Post Debridement: Improved Severity of Tissue Post Debridement: Fat layer exposed Post Procedure Diagnosis Same as Pre-procedure Electronic Signature(s) Signed: 02/07/2023 5:32:55 PM By: Baltazar Najjar MD Entered By: Baltazar Najjar on 02/07/2023 08:00:33 -------------------------------------------------------------------------------- Debridement Details Patient Name: Date of Service: CESILY, CUOCO NN 02/07/2023 9:30 A M Medical Record Number: [id number] Patient Account Number: [id number] Date of Birth/Sex: Treating RN: 02-22-1941 (82 y.o. F) Primary Care Provider: Norva Riffle Other Clinician: Referring Provider: Treating Provider/Extender: Theodosia Paling in Treatment: 0 Debridement Performed for Assessment: Wound #11 Left,Lateral Lower Leg Performed By: Physician Maxwell Caul., MD The following information was scribed by: Shawn Stall The information was scribed for: Baltazar Najjar Debridement Type: Debridement Severity of Tissue Pre Debridement: Limited to breakdown of skin Level of Consciousness (Pre-procedure): Awake and Alert Pre-procedure Verification/Time Out Yes - 10:15 Taken: Start Time: 10:16 Pain Control: Lidocaine 4% T opical Solution Percent of Wound Bed Debrided: 100% T Area Debrided (cm): otal 7.91 Tissue and other material debrided: Viable, Non-Viable, Eschar, Skin: Epidermis Level:  Skin/Epidermis Debridement Description: Selective/Open Wound Instrument: Curette Bleeding: None End Time: 10:22 Procedural Pain: 0 Post Procedural Pain: 0 Response to Treatment: Procedure was tolerated well Level of Consciousness (Post- Awake and Alert procedure): Post Debridement Measurements of Total Wound Length: (cm) 2.4 Width: (cm) 4.2 Depth: (cm) 0.1 Volume: (cm) 0.792 Character of Wound/Ulcer Post Debridement: Improved Severity of Tissue Post Debridement: Limited to breakdown of skin Post Procedure Diagnosis Same as Pre-procedure Electronic Signature(s) Signed: 02/07/2023 5:32:55 PM By: Baltazar Najjar MD Entered By: Baltazar Najjar on 02/07/2023 08:00:43 [name] ([id number]) [id number] Page 3  LILIANNE, DELAIR ([id number]) [id number] Page 1 of 17 Visit Report for 02/07/2023 Chief Complaint Document Details Patient Name: Date of Service: MADELLINE, ESHBACH NN 02/07/2023 9:30 A M Medical Record Number: [id number] Patient Account Number: [id number] Date of Birth/Sex: Treating RN: Mar 21, 1941 (82 y.o. F) Primary Care Provider: Norva Riffle Other Clinician: Referring Provider: Treating Provider/Extender: Theodosia Paling in Treatment: 0 Information Obtained from: Patient Chief Complaint Patient presents for treatment of open ulcers due to venous insufficiency 02/07/2023; patient returns to clinic with wounds on her bilateral lower legs in the setting of chronic venous insufficiency Electronic Signature(s) Signed: 02/07/2023 5:32:55 PM By: Baltazar Najjar MD Entered By: Baltazar Najjar on 02/07/2023 08:01:52 -------------------------------------------------------------------------------- Debridement Details Patient Name: Date of Service: TAYA, ASHBAUGH NN 02/07/2023 9:30 A M Medical Record Number: [id number] Patient Account Number: [id number] Date of Birth/Sex: Treating RN: July 14, 1940 (82 y.o. F) Primary Care Provider: Norva Riffle Other Clinician: Referring Provider: Treating Provider/Extender: Theodosia Paling in Treatment: 0 Debridement Performed for Assessment: Wound #10 Right,Medial Lower Leg Performed By: Physician Maxwell Caul., MD The following information was scribed by: Shawn Stall The information was scribed for: Baltazar Najjar Debridement Type: Debridement Severity of Tissue Pre Debridement: Limited to breakdown of skin Level of Consciousness (Pre-procedure): Awake and Alert Pre-procedure Verification/Time Out Yes - 10:15 Taken: Start Time: 10:16 Pain Control: Lidocaine 4% T opical Solution Percent of Wound Bed Debrided: 100% T Area Debrided (cm): otal 2.2 Tissue and other material  debrided: Viable, Non-Viable, Eschar, Skin: Epidermis Level: Skin/Epidermis Debridement Description: Selective/Open Wound Instrument: Curette Bleeding: None End Time: 10:22 Procedural Pain: 0 Post Procedural Pain: 0 Response to Treatment: Procedure was tolerated well Level of Consciousness (Post- Awake and Alert procedure): Post Debridement Measurements of Total Wound Length: (cm) 2 Width: (cm) 1.4 Depth: (cm) 0.1 [place], [name] ([id number]) [id number] Page 2 of 17 Volume: (cm) 0.22 Character of Wound/Ulcer Post Debridement: Improved Severity of Tissue Post Debridement: Fat layer exposed Post Procedure Diagnosis Same as Pre-procedure Electronic Signature(s) Signed: 02/07/2023 5:32:55 PM By: Baltazar Najjar MD Entered By: Baltazar Najjar on 02/07/2023 08:00:33 -------------------------------------------------------------------------------- Debridement Details Patient Name: Date of Service: CESILY, CUOCO NN 02/07/2023 9:30 A M Medical Record Number: [id number] Patient Account Number: [id number] Date of Birth/Sex: Treating RN: 02-22-1941 (82 y.o. F) Primary Care Provider: Norva Riffle Other Clinician: Referring Provider: Treating Provider/Extender: Theodosia Paling in Treatment: 0 Debridement Performed for Assessment: Wound #11 Left,Lateral Lower Leg Performed By: Physician Maxwell Caul., MD The following information was scribed by: Shawn Stall The information was scribed for: Baltazar Najjar Debridement Type: Debridement Severity of Tissue Pre Debridement: Limited to breakdown of skin Level of Consciousness (Pre-procedure): Awake and Alert Pre-procedure Verification/Time Out Yes - 10:15 Taken: Start Time: 10:16 Pain Control: Lidocaine 4% T opical Solution Percent of Wound Bed Debrided: 100% T Area Debrided (cm): otal 7.91 Tissue and other material debrided: Viable, Non-Viable, Eschar, Skin: Epidermis Level:  Skin/Epidermis Debridement Description: Selective/Open Wound Instrument: Curette Bleeding: None End Time: 10:22 Procedural Pain: 0 Post Procedural Pain: 0 Response to Treatment: Procedure was tolerated well Level of Consciousness (Post- Awake and Alert procedure): Post Debridement Measurements of Total Wound Length: (cm) 2.4 Width: (cm) 4.2 Depth: (cm) 0.1 Volume: (cm) 0.792 Character of Wound/Ulcer Post Debridement: Improved Severity of Tissue Post Debridement: Limited to breakdown of skin Post Procedure Diagnosis Same as Pre-procedure Electronic Signature(s) Signed: 02/07/2023 5:32:55 PM By: Baltazar Najjar MD Entered By: Baltazar Najjar on 02/07/2023 08:00:43 [name] ([id number]) [id number] Page 3  Per Week/30 Days Discharge Instructions: May shower and wash wound with dial antibacterial soap and water prior to dressing change. Cleanser: Vashe 5.8 (oz) 1 x Per Week/30 Days Discharge Instructions: Cleanse the wound with Vashe prior to applying a clean dressing using gauze sponges, not tissue or cotton balls. Peri-Wound Care: Triamcinolone 15 (g) 1 x Per Week/30 Days Discharge Instructions: Use triamcinolone 15 (g) as directed Peri-Wound Care: Sween Lotion (Moisturizing lotion) 1 x Per Week/30 Days [place], [name] ([id number]) [id number] Page 15 of 17 Discharge Instructions: Apply moisturizing lotion as directed Prim Dressing: Hydrofera Blue Ready Transfer Foam, 2.5x2.5 (in/in) 1 x Per Week/30 Days ary Discharge Instructions: Apply  directly to wound bed as directed Secondary Dressing: ABD Pad, 8x10 1 x Per Week/30 Days Discharge Instructions: Apply over primary dressing as directed. Com pression Wrap: Kerlix Roll 4.5x3.1 (in/yd) 1 x Per Week/30 Days Discharge Instructions: Apply Kerlix and Coban compression as directed. Com pression Wrap: Coban Self-Adherent Wrap 4x5 (in/yd) 1 x Per Week/30 Days Discharge Instructions: Apply over Kerlix as directed. WOUND #9: - Lower Leg Wound Laterality: Right, Lateral Cleanser: Soap and Water 1 x Per Week/30 Days Discharge Instructions: May shower and wash wound with dial antibacterial soap and water prior to dressing change. Cleanser: Vashe 5.8 (oz) 1 x Per Week/30 Days Discharge Instructions: Cleanse the wound with Vashe prior to applying a clean dressing using gauze sponges, not tissue or cotton balls. Peri-Wound Care: Triamcinolone 15 (g) 1 x Per Week/30 Days Discharge Instructions: Use triamcinolone 15 (g) as directed Peri-Wound Care: Sween Lotion (Moisturizing lotion) 1 x Per Week/30 Days Discharge Instructions: Apply moisturizing lotion as directed Prim Dressing: Hydrofera Blue Ready Transfer Foam, 2.5x2.5 (in/in) 1 x Per Week/30 Days ary Discharge Instructions: Apply directly to wound bed as directed Secondary Dressing: ABD Pad, 8x10 1 x Per Week/30 Days Discharge Instructions: Apply over primary dressing as directed. Com pression Wrap: Kerlix Roll 4.5x3.1 (in/yd) 1 x Per Week/30 Days Discharge Instructions: Apply Kerlix and Coban compression as directed. Com pression Wrap: Coban Self-Adherent Wrap 4x5 (in/yd) 1 x Per Week/30 Days Discharge Instructions: Apply over Kerlix as directed. 1. The patient has chronic venous insufficiency and recurrent wounds. She does not have any blisters currently and does not have systemic fluid volume overload 2. Unfortunately I think she probably has significant PAD as well 3. We are going to use TCA moisturizer Hydrofera Blue and only  kerlix Coban wrap because of the likelihood of significant PAD/tibial artery disease. She does not complain of claudication that I could determine historically 4. If these wounds do not heal or she develops other wounds we probably will have to do a full arterial evaluation. Electronic Signature(s) Signed: 02/07/2023 5:32:55 PM By: Baltazar Najjar MD Entered By: Baltazar Najjar on 02/07/2023 32:44:01 -------------------------------------------------------------------------------- HxROS Details Patient Name: Date of Service: KAREEMAH, GROUNDS NN 02/07/2023 9:30 A M Medical Record Number: [id number] Patient Account Number: [id number] Date of Birth/Sex: Treating RN: Apr 03, 1941 (82 y.o. [name] Primary Care Provider: Norva Riffle Other Clinician: Referring Provider: Treating Provider/Extender: Theodosia Paling in Treatment: 0 Information Obtained From Patient Caregiver Chart Constitutional Symptoms (General Health) Medical History: Past Medical History Notes: vitamin D deficiency Eyes Medical History: Positive for: Cataracts - bil removed Negative for: Glaucoma; Optic Neuritis Hematologic/Lymphatic Medical History: Negative for: Anemia Cardiovascular Medical History: Positive for: Arrhythmia - afib; Congestive Heart Failure; Hypertension LIANI, CARIS ([id number]) [id number] Page 16 of 17 Past Medical History Notes: hyperlipidemia Endocrine Medical History: Positive for: Type II Diabetes Negative  LILIANNE, DELAIR ([id number]) [id number] Page 1 of 17 Visit Report for 02/07/2023 Chief Complaint Document Details Patient Name: Date of Service: MADELLINE, ESHBACH NN 02/07/2023 9:30 A M Medical Record Number: [id number] Patient Account Number: [id number] Date of Birth/Sex: Treating RN: Mar 21, 1941 (82 y.o. F) Primary Care Provider: Norva Riffle Other Clinician: Referring Provider: Treating Provider/Extender: Theodosia Paling in Treatment: 0 Information Obtained from: Patient Chief Complaint Patient presents for treatment of open ulcers due to venous insufficiency 02/07/2023; patient returns to clinic with wounds on her bilateral lower legs in the setting of chronic venous insufficiency Electronic Signature(s) Signed: 02/07/2023 5:32:55 PM By: Baltazar Najjar MD Entered By: Baltazar Najjar on 02/07/2023 08:01:52 -------------------------------------------------------------------------------- Debridement Details Patient Name: Date of Service: TAYA, ASHBAUGH NN 02/07/2023 9:30 A M Medical Record Number: [id number] Patient Account Number: [id number] Date of Birth/Sex: Treating RN: July 14, 1940 (82 y.o. F) Primary Care Provider: Norva Riffle Other Clinician: Referring Provider: Treating Provider/Extender: Theodosia Paling in Treatment: 0 Debridement Performed for Assessment: Wound #10 Right,Medial Lower Leg Performed By: Physician Maxwell Caul., MD The following information was scribed by: Shawn Stall The information was scribed for: Baltazar Najjar Debridement Type: Debridement Severity of Tissue Pre Debridement: Limited to breakdown of skin Level of Consciousness (Pre-procedure): Awake and Alert Pre-procedure Verification/Time Out Yes - 10:15 Taken: Start Time: 10:16 Pain Control: Lidocaine 4% T opical Solution Percent of Wound Bed Debrided: 100% T Area Debrided (cm): otal 2.2 Tissue and other material  debrided: Viable, Non-Viable, Eschar, Skin: Epidermis Level: Skin/Epidermis Debridement Description: Selective/Open Wound Instrument: Curette Bleeding: None End Time: 10:22 Procedural Pain: 0 Post Procedural Pain: 0 Response to Treatment: Procedure was tolerated well Level of Consciousness (Post- Awake and Alert procedure): Post Debridement Measurements of Total Wound Length: (cm) 2 Width: (cm) 1.4 Depth: (cm) 0.1 [place], [name] ([id number]) [id number] Page 2 of 17 Volume: (cm) 0.22 Character of Wound/Ulcer Post Debridement: Improved Severity of Tissue Post Debridement: Fat layer exposed Post Procedure Diagnosis Same as Pre-procedure Electronic Signature(s) Signed: 02/07/2023 5:32:55 PM By: Baltazar Najjar MD Entered By: Baltazar Najjar on 02/07/2023 08:00:33 -------------------------------------------------------------------------------- Debridement Details Patient Name: Date of Service: CESILY, CUOCO NN 02/07/2023 9:30 A M Medical Record Number: [id number] Patient Account Number: [id number] Date of Birth/Sex: Treating RN: 02-22-1941 (82 y.o. F) Primary Care Provider: Norva Riffle Other Clinician: Referring Provider: Treating Provider/Extender: Theodosia Paling in Treatment: 0 Debridement Performed for Assessment: Wound #11 Left,Lateral Lower Leg Performed By: Physician Maxwell Caul., MD The following information was scribed by: Shawn Stall The information was scribed for: Baltazar Najjar Debridement Type: Debridement Severity of Tissue Pre Debridement: Limited to breakdown of skin Level of Consciousness (Pre-procedure): Awake and Alert Pre-procedure Verification/Time Out Yes - 10:15 Taken: Start Time: 10:16 Pain Control: Lidocaine 4% T opical Solution Percent of Wound Bed Debrided: 100% T Area Debrided (cm): otal 7.91 Tissue and other material debrided: Viable, Non-Viable, Eschar, Skin: Epidermis Level:  Skin/Epidermis Debridement Description: Selective/Open Wound Instrument: Curette Bleeding: None End Time: 10:22 Procedural Pain: 0 Post Procedural Pain: 0 Response to Treatment: Procedure was tolerated well Level of Consciousness (Post- Awake and Alert procedure): Post Debridement Measurements of Total Wound Length: (cm) 2.4 Width: (cm) 4.2 Depth: (cm) 0.1 Volume: (cm) 0.792 Character of Wound/Ulcer Post Debridement: Improved Severity of Tissue Post Debridement: Limited to breakdown of skin Post Procedure Diagnosis Same as Pre-procedure Electronic Signature(s) Signed: 02/07/2023 5:32:55 PM By: Baltazar Najjar MD Entered By: Baltazar Najjar on 02/07/2023 08:00:43 [name] ([id number]) [id number] Page 3  LILIANNE, DELAIR ([id number]) [id number] Page 1 of 17 Visit Report for 02/07/2023 Chief Complaint Document Details Patient Name: Date of Service: MADELLINE, ESHBACH NN 02/07/2023 9:30 A M Medical Record Number: [id number] Patient Account Number: [id number] Date of Birth/Sex: Treating RN: Mar 21, 1941 (82 y.o. F) Primary Care Provider: Norva Riffle Other Clinician: Referring Provider: Treating Provider/Extender: Theodosia Paling in Treatment: 0 Information Obtained from: Patient Chief Complaint Patient presents for treatment of open ulcers due to venous insufficiency 02/07/2023; patient returns to clinic with wounds on her bilateral lower legs in the setting of chronic venous insufficiency Electronic Signature(s) Signed: 02/07/2023 5:32:55 PM By: Baltazar Najjar MD Entered By: Baltazar Najjar on 02/07/2023 08:01:52 -------------------------------------------------------------------------------- Debridement Details Patient Name: Date of Service: TAYA, ASHBAUGH NN 02/07/2023 9:30 A M Medical Record Number: [id number] Patient Account Number: [id number] Date of Birth/Sex: Treating RN: July 14, 1940 (82 y.o. F) Primary Care Provider: Norva Riffle Other Clinician: Referring Provider: Treating Provider/Extender: Theodosia Paling in Treatment: 0 Debridement Performed for Assessment: Wound #10 Right,Medial Lower Leg Performed By: Physician Maxwell Caul., MD The following information was scribed by: Shawn Stall The information was scribed for: Baltazar Najjar Debridement Type: Debridement Severity of Tissue Pre Debridement: Limited to breakdown of skin Level of Consciousness (Pre-procedure): Awake and Alert Pre-procedure Verification/Time Out Yes - 10:15 Taken: Start Time: 10:16 Pain Control: Lidocaine 4% T opical Solution Percent of Wound Bed Debrided: 100% T Area Debrided (cm): otal 2.2 Tissue and other material  debrided: Viable, Non-Viable, Eschar, Skin: Epidermis Level: Skin/Epidermis Debridement Description: Selective/Open Wound Instrument: Curette Bleeding: None End Time: 10:22 Procedural Pain: 0 Post Procedural Pain: 0 Response to Treatment: Procedure was tolerated well Level of Consciousness (Post- Awake and Alert procedure): Post Debridement Measurements of Total Wound Length: (cm) 2 Width: (cm) 1.4 Depth: (cm) 0.1 [place], [name] ([id number]) [id number] Page 2 of 17 Volume: (cm) 0.22 Character of Wound/Ulcer Post Debridement: Improved Severity of Tissue Post Debridement: Fat layer exposed Post Procedure Diagnosis Same as Pre-procedure Electronic Signature(s) Signed: 02/07/2023 5:32:55 PM By: Baltazar Najjar MD Entered By: Baltazar Najjar on 02/07/2023 08:00:33 -------------------------------------------------------------------------------- Debridement Details Patient Name: Date of Service: CESILY, CUOCO NN 02/07/2023 9:30 A M Medical Record Number: [id number] Patient Account Number: [id number] Date of Birth/Sex: Treating RN: 02-22-1941 (82 y.o. F) Primary Care Provider: Norva Riffle Other Clinician: Referring Provider: Treating Provider/Extender: Theodosia Paling in Treatment: 0 Debridement Performed for Assessment: Wound #11 Left,Lateral Lower Leg Performed By: Physician Maxwell Caul., MD The following information was scribed by: Shawn Stall The information was scribed for: Baltazar Najjar Debridement Type: Debridement Severity of Tissue Pre Debridement: Limited to breakdown of skin Level of Consciousness (Pre-procedure): Awake and Alert Pre-procedure Verification/Time Out Yes - 10:15 Taken: Start Time: 10:16 Pain Control: Lidocaine 4% T opical Solution Percent of Wound Bed Debrided: 100% T Area Debrided (cm): otal 7.91 Tissue and other material debrided: Viable, Non-Viable, Eschar, Skin: Epidermis Level:  Skin/Epidermis Debridement Description: Selective/Open Wound Instrument: Curette Bleeding: None End Time: 10:22 Procedural Pain: 0 Post Procedural Pain: 0 Response to Treatment: Procedure was tolerated well Level of Consciousness (Post- Awake and Alert procedure): Post Debridement Measurements of Total Wound Length: (cm) 2.4 Width: (cm) 4.2 Depth: (cm) 0.1 Volume: (cm) 0.792 Character of Wound/Ulcer Post Debridement: Improved Severity of Tissue Post Debridement: Limited to breakdown of skin Post Procedure Diagnosis Same as Pre-procedure Electronic Signature(s) Signed: 02/07/2023 5:32:55 PM By: Baltazar Najjar MD Entered By: Baltazar Najjar on 02/07/2023 08:00:43 [name] ([id number]) [id number] Page 3

## 2023-02-08 NOTE — Progress Notes (Signed)
Mottled: No Pallor: No Moisture Rubor: No No Abnormalities Noted: No IXCHEL, DUCK ([id number]) [id number] Page 21 of 22 Dry / Scaly: Yes Maceration: No Treatment Notes Wound #9 (Lower Leg) Wound Laterality: Right, Lateral Cleanser Soap and Water Discharge Instruction: May shower and wash wound with dial antibacterial soap and water prior to dressing change. Vashe 5.8 (oz) Discharge Instruction: Cleanse the wound with Vashe prior to applying a clean dressing using gauze sponges, not tissue or cotton balls. Peri-Wound Care Triamcinolone 15 (g) Discharge Instruction: Use triamcinolone 15 (g) as directed Sween Lotion (Moisturizing lotion) Discharge Instruction: Apply moisturizing lotion as directed Topical Primary Dressing Hydrofera Blue Ready Transfer Foam, 2.5x2.5 (in/in) Discharge Instruction: Apply directly to wound bed as directed Secondary Dressing ABD Pad, 8x10 Discharge Instruction: Apply over primary dressing as directed. Secured With Compression Wrap Kerlix Roll 4.5x3.1 (in/yd) Discharge Instruction: Apply Kerlix and Coban compression as directed. Coban  Self-Adherent Wrap 4x5 (in/yd) Discharge Instruction: Apply over Kerlix as directed. Compression Stockings Add-Ons Electronic Signature(s) Signed: 02/07/2023 5:17:24 PM By: Shawn Stall RN, BSN Entered By: Shawn Stall on 02/07/2023 06:58:43 -------------------------------------------------------------------------------- Vitals Details Patient Name: Date of Service: [name], [name] 02/07/2023 9:30 A M Medical Record Number: [id number] Patient Account Number: [id number] Date of Birth/Sex: Treating RN: November 27, 1940 (82 y.o. [name], Millard.Loa Primary Care Azarias Chiou: Norva Riffle Other Clinician: Referring Dhani Imel: Treating Breandan People/Extender: Theodosia Paling in Treatment: 0 Vital Signs Time Taken: 09:40 Temperature (F): 97.3 Height (in): 64 Pulse (bpm): 54 Source: Stated Respiratory Rate (breaths/min): 20 Weight (lbs): 160 Blood Pressure (mmHg): 130/65 Source: Stated Reference Range: 80 - 120 mg / dl Body Mass Index (BMI): 27.5 Notes granddaughter checks blood glucose for patient every night- forgot the number. IZAMAR, LINDEN ([id number]) [id number] Page 22 of 22 Electronic Signature(s) Signed: 02/07/2023 5:17:24 PM By: Shawn Stall RN, BSN Entered By: Shawn Stall on 02/07/2023 06:46:15  Wound Assessment Details Patient Name: Date of Service: [name], [name] 02/07/2023 9:30 A M Medical Record Number: [id number] Patient Account Number: [id number] Date of Birth/Sex: Treating RN: Feb 23, 1941 (82 y.o. [name], Millard.Loa Primary Care Herndon Grill: Norva Riffle Other Clinician: Referring Demonte Dobratz: Treating Marcianna Daily/Extender: Theodosia Paling in Treatment: 0 Wound Status Wound Number: 10 Primary Diabetic Wound/Ulcer of the Lower Extremity Etiology: Wound Location: Right, Medial Lower Leg Secondary Lymphedema Wounding Event: Blister Etiology: Date Acquired: 01/03/2023 Wound Open Weeks Of Treatment: 0 Status: Clustered Wound: No Comorbid Cataracts, Arrhythmia, Congestive Heart Failure, Hypertension, History: Type II Diabetes, End Stage Renal Disease, Neuropathy Photos Wound Measurements Length: (cm) 2 Width: (cm) 1.4 Depth: (cm) 0.1 Area: (cm) 2.199 Volume: (cm) 0.22 % Reduction in Area: % Reduction in Volume: Epithelialization: None Tunneling: No Undermining: No Wound Description Classification: Unable to visualize wound bed Wound Margin: Distinct, outline attached Exudate Amount: None Present Foul Odor After Cleansing: No Slough/Fibrino No Wound Bed Granulation Amount: None Present (0%) Exposed Structure Necrotic Amount: Large (67-100%) Fascia Exposed: No Necrotic Quality: Eschar Fat Layer (Subcutaneous Tissue) Exposed: No Tendon Exposed: No Muscle Exposed: No Joint Exposed: No Bone Exposed: No Periwound Skin Texture Texture Color No Abnormalities Noted: No No Abnormalities Noted: No Callus: No Atrophie Blanche: No Crepitus: No Cyanosis: No Excoriation: No Ecchymosis: No Induration: No Erythema: No Rash: No Hemosiderin Staining: Yes Scarring: No Mottled: No Pallor: No Moisture Rubor: No No Abnormalities Noted: No Dry / Scaly:  Yes Maceration: No SHAQUILLE, MURDY ([id number]) [id number] Page 13 of 22 Treatment Notes Wound #10 (Lower Leg) Wound Laterality: Right, Medial Cleanser Soap and Water Discharge Instruction: May shower and wash wound with dial antibacterial soap and water prior to dressing change. Vashe 5.8 (oz) Discharge Instruction: Cleanse the wound with Vashe prior to applying a clean dressing using gauze sponges, not tissue or cotton balls. Peri-Wound Care Triamcinolone 15 (g) Discharge Instruction: Use triamcinolone 15 (g) as directed Sween Lotion (Moisturizing lotion) Discharge Instruction: Apply moisturizing lotion as directed Topical Primary Dressing Hydrofera Blue Ready Transfer Foam, 2.5x2.5 (in/in) Discharge Instruction: Apply directly to wound bed as directed Secondary Dressing ABD Pad, 8x10 Discharge Instruction: Apply over primary dressing as directed. Secured With Compression Wrap Kerlix Roll 4.5x3.1 (in/yd) Discharge Instruction: Apply Kerlix and Coban compression as directed. Coban Self-Adherent Wrap 4x5 (in/yd) Discharge Instruction: Apply over Kerlix as directed. Compression Stockings Add-Ons Electronic Signature(s) Signed: 02/07/2023 5:17:24 PM By: Shawn Stall RN, BSN Entered By: Shawn Stall on 02/07/2023 06:59:51 -------------------------------------------------------------------------------- Wound Assessment Details Patient Name: Date of Service: [name], [name] 02/07/2023 9:30 A M Medical Record Number: [id number] Patient Account Number: [id number] Date of Birth/Sex: Treating RN: [birth year] (82 y.o. [name], [name] Primary Care Dorina Ribaudo: Norva Riffle Other Clinician: Referring Kella Splinter: Treating Meila Berke/Extender: Theodosia Paling in Treatment: 0 Wound Status Wound Number: 11 Primary Diabetic Wound/Ulcer of the Lower Extremity Etiology: Wound Location: Left, Lateral Lower Leg Secondary Lymphedema Wounding  Event: Blister Etiology: Date Acquired: 01/03/2023 Wound Open Weeks Of Treatment: 0 Status: Clustered Wound: No Comorbid Cataracts, Arrhythmia, Congestive Heart Failure, Hypertension, History: Type II Diabetes, End Stage Renal Disease, Neuropathy Photos LEONETTA, MCGIVERN ([id number]) [id number] Page 14 of 22 Wound Measurements Length: (cm) 2.4 Width: (cm) 4.2 Depth: (cm) 0.1 Area: (cm) 7.917 Volume: (cm) 0.792 % Reduction in Area: % Reduction in Volume: Epithelialization: None Tunneling: No Undermining: No Wound Description Classification: Unable to visualize wound bed Wound Margin: Distinct, outline attached Exudate Amount: None Present Foul Odor After Cleansing: No Slough/Fibrino No Wound  Secondary Lymphedema Wounding Event: Blister Etiology: Date Acquired: 01/03/2023 Wound Open Weeks Of Treatment: 0 Status: Clustered Wound: No Comorbid Cataracts, Arrhythmia, Congestive Heart Failure, Hypertension, History: Type II Diabetes, End Stage Renal Disease, Neuropathy Photos Wound Measurements Length: (cm) 1.7 Eppinger, Daisa ([id number]) Width: (cm) 1 Depth: (cm) 0.1 Area: (cm) 1.335 Volume: (cm) 0.134 % Reduction in Area: [id number].pdf Page 19 of 22 % Reduction in Volume: Epithelialization: None Tunneling: No Undermining: No Wound Description Classification: Unable to visualize wound bed Wound Margin: Distinct, outline attached Exudate Amount: None Present Foul Odor After Cleansing: No Slough/Fibrino No Wound Bed Granulation Amount: None Present (0%) Exposed Structure Necrotic Amount: Large (67-100%) Fascia Exposed: No Necrotic Quality: Eschar Fat Layer (Subcutaneous Tissue) Exposed: No Tendon Exposed: No Muscle Exposed: No Joint Exposed: No Bone Exposed: No Periwound Skin Texture Texture Color No Abnormalities Noted: No No Abnormalities Noted: No Callus: No Atrophie Blanche: No Crepitus: No Cyanosis: No Excoriation: No Ecchymosis: No Induration: No Erythema: No Rash: No Hemosiderin Staining: Yes Scarring: No Mottled: No Pallor: No Moisture Rubor: No No Abnormalities Noted: No Dry / Scaly: Yes Maceration: No Treatment Notes Wound #14 (Lower Leg) Wound Laterality: Left, Medial Cleanser Soap and Water Discharge Instruction: May shower and wash wound with dial antibacterial soap and water prior to dressing change. Vashe 5.8 (oz) Discharge Instruction: Cleanse the wound with Vashe prior to applying a clean dressing using gauze sponges, not tissue or cotton balls. Peri-Wound  Care Triamcinolone 15 (g) Discharge Instruction: Use triamcinolone 15 (g) as directed Sween Lotion (Moisturizing lotion) Discharge Instruction: Apply moisturizing lotion as directed Topical Primary Dressing Hydrofera Blue Ready Transfer Foam, 2.5x2.5 (in/in) Discharge Instruction: Apply directly to wound bed as directed Secondary Dressing ABD Pad, 8x10 Discharge Instruction: Apply over primary dressing as directed. Secured With Compression Wrap Kerlix Roll 4.5x3.1 (in/yd) Discharge Instruction: Apply Kerlix and Coban compression as directed. Coban Self-Adherent Wrap 4x5 (in/yd) Discharge Instruction: Apply over Kerlix as directed. Compression Stockings Add-Ons Electronic Signature(s) Signed: 02/07/2023 5:17:24 PM By: Shawn Stall RN, BSN Veblen, [name] ([id number]) [id number].pdf Page 20 of 22 Entered By: Shawn Stall on 02/07/2023 07:06:01 -------------------------------------------------------------------------------- Wound Assessment Details Patient Name: Date of Service: [name], [name] 02/07/2023 9:30 A M Medical Record Number: [id number] Patient Account Number: [id number] Date of Birth/Sex: Treating RN: 15-May-1940 (82 y.o. [name], Millard.Loa Primary Care Anjalina Bergevin: Norva Riffle Other Clinician: Referring Detron Carras: Treating Jaxn Chiquito/Extender: Theodosia Paling in Treatment: 0 Wound Status Wound Number: 9 Primary Diabetic Wound/Ulcer of the Lower Extremity Etiology: Wound Location: Right, Lateral Lower Leg Secondary Lymphedema Wounding Event: Blister Etiology: Date Acquired: 01/03/2023 Wound Open Weeks Of Treatment: 0 Status: Clustered Wound: No Comorbid Cataracts, Arrhythmia, Congestive Heart Failure, Hypertension, History: Type II Diabetes, End Stage Renal Disease, Neuropathy Photos Wound Measurements Length: (cm) 0.9 Width: (cm) 1.5 Depth: (cm) 0.1 Area: (cm) 1.06 Volume: (cm) 0.106 % Reduction in Area: %  Reduction in Volume: Epithelialization: None Tunneling: No Undermining: No Wound Description Classification: Unable to visualize wound bed Wound Margin: Distinct, outline attached Exudate Amount: None Present Foul Odor After Cleansing: No Slough/Fibrino No Wound Bed Granulation Amount: None Present (0%) Exposed Structure Necrotic Amount: Large (67-100%) Fascia Exposed: No Necrotic Quality: Eschar Fat Layer (Subcutaneous Tissue) Exposed: No Tendon Exposed: No Muscle Exposed: No Joint Exposed: No Bone Exposed: No Periwound Skin Texture Texture Color No Abnormalities Noted: No No Abnormalities Noted: No Callus: No Atrophie Blanche: No Crepitus: No Cyanosis: No Excoriation: No Ecchymosis: No Induration: No Erythema: No Rash: No Hemosiderin Staining: Yes Scarring: No  Support Surface(s) Assessment (bed, cushion, seat, etc.) INTERVENTIONS - Miscellaneous []  - 0 External ear exam []  - 0 Patient Transfer (multiple staff / Nurse, adult / Similar devices) []  - 0 Simple Staple / Suture removal (25 or less) []  - 0 Complex Staple / Suture removal (26 or more) []  - 0 Hypo/Hyperglycemic Management (do not check if billed separately) X- 1 15 Ankle / Brachial Index (ABI) - do not check if billed separately Has the patient been seen at the hospital within the last three years: Yes Total Score: 115 Level Of Care: New/Established - Level 3 Electronic Signature(s) Signed: 02/07/2023 5:17:24 PM By: Shawn Stall RN, BSN [place], [name] ([id number]) PM By: Shawn Stall RN, BSN [phone].pdf Page 3 of 22 Signed: 02/07/2023 5:17:24 Entered By: Shawn Stall on 02/07/2023 07:28:04 -------------------------------------------------------------------------------- Encounter Discharge Information Details Patient Name: Date of Service: [name], [name] 02/07/2023 9:30 A M Medical Record Number: [id number] Patient Account Number: [id number] Date of Birth/Sex: Treating RN: Apr 14, 1941 (82 y.o. [name] Primary Care Zamya Culhane: Norva Riffle Other Clinician: Referring Willow Reczek: Treating Aaliya Maultsby/Extender: Theodosia Paling in Treatment: 0 Encounter Discharge Information Items Post Procedure Vitals Discharge Condition: Stable Temperature (F): 97.3 Ambulatory Status: Wheelchair Pulse (bpm): 54 Discharge Destination: Home Respiratory Rate (breaths/min):  20 Transportation: Private Auto Blood Pressure (mmHg): 130/65 Accompanied By: self Schedule Follow-up Appointment: Yes Clinical Summary of Care: Electronic Signature(s) Signed: 02/07/2023 5:17:24 PM By: Shawn Stall RN, BSN Entered By: Shawn Stall on 02/07/2023 07:28:51 -------------------------------------------------------------------------------- Lower Extremity Assessment Details Patient Name: Date of Service: ANGELLA, MONTAS Marquez 02/07/2023 9:30 A M Medical Record Number: [id number] Patient Account Number: [id number] Date of Birth/Sex: Treating RN: 04/01/1941 (82 y.o. [name], Millard.Loa Primary Care Bailee Metter: Norva Riffle Other Clinician: Referring Donnis Pecha: Treating Damarian Priola/Extender: Theodosia Paling in Treatment: 0 Edema Assessment Assessed: Kyra Searles: Yes] Franne Forts: Yes] Edema: [Left: No] [Right: No] Calf Left: Right: Point of Measurement: 31 cm From Medial Instep 29 cm 29 cm Ankle Left: Right: Point of Measurement: 10 cm From Medial Instep 18 cm 18 cm Knee To Floor Left: Right: From Medial Instep 40 cm 40 cm Vascular Assessment Pulses: Dorsalis Pedis Palpable: [Left:Yes] [Right:Yes] Doppler Audible: [Left:Yes] [Right:Yes] [name] ([id number]) [Right:[id number] Page 4 of 22] Posterior Tibial Palpable: [Left:Yes] [Right:Yes] Doppler Audible: [Left:Yes] [Right:Yes] Extremity colors, hair growth, and conditions: Extremity Color: [Left:Hyperpigmented] [Right:Hyperpigmented] Hair Growth on Extremity: [Left:No] [Right:No] Temperature of Extremity: [Left:Warm] [Right:Warm] Capillary Refill: [Left:> 3 seconds] [Right:> 3 seconds] Dependent Rubor: [Left:No] [Right:No] Blanched when Elevated: [Left:No] [Right:No] Lipodermatosclerosis: [Left:Yes] [Right:Yes] Blood Pressure: Brachial: [Left:130] Ankle: [Left:Dorsalis Pedis: 50 0.38] [Right:Dorsalis Pedis: 170 1.31] Toe Nail Assessment Left: Right: Thick: Yes  Yes Discolored: Yes Yes Deformed: Yes Yes Improper Length and Hygiene: No Notes monophasic waveforms to left foot DP and PT Right foot DP and PT mulitphasic waveforms. . Electronic Signature(s) Signed: 02/07/2023 5:17:24 PM By: Shawn Stall RN, BSN Entered By: Shawn Stall on 02/07/2023 07:29:07 -------------------------------------------------------------------------------- Multi Wound Chart Details Patient Name: Date of Service: DYNEISHA, MURCHISON Marquez 02/07/2023 9:30 A M Medical Record Number: [id number] Patient Account Number: [id number] Date of Birth/Sex: Treating RN: Feb 24, 1941 (82 y.o. F) Primary Care Dalana Pfahler: Norva Riffle Other Clinician: Referring Naser Schuld: Treating Amarya Kuehl/Extender: Theodosia Paling in Treatment: 0 Vital Signs Height(in): 64 Pulse(bpm): 54 Weight(lbs): 160 Blood Pressure(mmHg): 130/65 Body Mass Index(BMI): 27.5 Temperature(F): 97.3 Respiratory Rate(breaths/min): 20 [10:Photos:] Right, Medial Lower Leg Left, Lateral Lower Leg Left, Proximal, Anterior Lower Leg Wound Location: Blister Blister Blister Wounding Event: Diabetic Wound/Ulcer  Secondary Lymphedema Wounding Event: Blister Etiology: Date Acquired: 01/03/2023 Wound Open Weeks Of Treatment: 0 Status: Clustered Wound: No Comorbid Cataracts, Arrhythmia, Congestive Heart Failure, Hypertension, History: Type II Diabetes, End Stage Renal Disease, Neuropathy Photos Wound Measurements Length: (cm) 1.7 Eppinger, Daisa ([id number]) Width: (cm) 1 Depth: (cm) 0.1 Area: (cm) 1.335 Volume: (cm) 0.134 % Reduction in Area: [id number].pdf Page 19 of 22 % Reduction in Volume: Epithelialization: None Tunneling: No Undermining: No Wound Description Classification: Unable to visualize wound bed Wound Margin: Distinct, outline attached Exudate Amount: None Present Foul Odor After Cleansing: No Slough/Fibrino No Wound Bed Granulation Amount: None Present (0%) Exposed Structure Necrotic Amount: Large (67-100%) Fascia Exposed: No Necrotic Quality: Eschar Fat Layer (Subcutaneous Tissue) Exposed: No Tendon Exposed: No Muscle Exposed: No Joint Exposed: No Bone Exposed: No Periwound Skin Texture Texture Color No Abnormalities Noted: No No Abnormalities Noted: No Callus: No Atrophie Blanche: No Crepitus: No Cyanosis: No Excoriation: No Ecchymosis: No Induration: No Erythema: No Rash: No Hemosiderin Staining: Yes Scarring: No Mottled: No Pallor: No Moisture Rubor: No No Abnormalities Noted: No Dry / Scaly: Yes Maceration: No Treatment Notes Wound #14 (Lower Leg) Wound Laterality: Left, Medial Cleanser Soap and Water Discharge Instruction: May shower and wash wound with dial antibacterial soap and water prior to dressing change. Vashe 5.8 (oz) Discharge Instruction: Cleanse the wound with Vashe prior to applying a clean dressing using gauze sponges, not tissue or cotton balls. Peri-Wound  Care Triamcinolone 15 (g) Discharge Instruction: Use triamcinolone 15 (g) as directed Sween Lotion (Moisturizing lotion) Discharge Instruction: Apply moisturizing lotion as directed Topical Primary Dressing Hydrofera Blue Ready Transfer Foam, 2.5x2.5 (in/in) Discharge Instruction: Apply directly to wound bed as directed Secondary Dressing ABD Pad, 8x10 Discharge Instruction: Apply over primary dressing as directed. Secured With Compression Wrap Kerlix Roll 4.5x3.1 (in/yd) Discharge Instruction: Apply Kerlix and Coban compression as directed. Coban Self-Adherent Wrap 4x5 (in/yd) Discharge Instruction: Apply over Kerlix as directed. Compression Stockings Add-Ons Electronic Signature(s) Signed: 02/07/2023 5:17:24 PM By: Shawn Stall RN, BSN Veblen, [name] ([id number]) [id number].pdf Page 20 of 22 Entered By: Shawn Stall on 02/07/2023 07:06:01 -------------------------------------------------------------------------------- Wound Assessment Details Patient Name: Date of Service: [name], [name] 02/07/2023 9:30 A M Medical Record Number: [id number] Patient Account Number: [id number] Date of Birth/Sex: Treating RN: 15-May-1940 (82 y.o. [name], Millard.Loa Primary Care Anjalina Bergevin: Norva Riffle Other Clinician: Referring Detron Carras: Treating Jaxn Chiquito/Extender: Theodosia Paling in Treatment: 0 Wound Status Wound Number: 9 Primary Diabetic Wound/Ulcer of the Lower Extremity Etiology: Wound Location: Right, Lateral Lower Leg Secondary Lymphedema Wounding Event: Blister Etiology: Date Acquired: 01/03/2023 Wound Open Weeks Of Treatment: 0 Status: Clustered Wound: No Comorbid Cataracts, Arrhythmia, Congestive Heart Failure, Hypertension, History: Type II Diabetes, End Stage Renal Disease, Neuropathy Photos Wound Measurements Length: (cm) 0.9 Width: (cm) 1.5 Depth: (cm) 0.1 Area: (cm) 1.06 Volume: (cm) 0.106 % Reduction in Area: %  Reduction in Volume: Epithelialization: None Tunneling: No Undermining: No Wound Description Classification: Unable to visualize wound bed Wound Margin: Distinct, outline attached Exudate Amount: None Present Foul Odor After Cleansing: No Slough/Fibrino No Wound Bed Granulation Amount: None Present (0%) Exposed Structure Necrotic Amount: Large (67-100%) Fascia Exposed: No Necrotic Quality: Eschar Fat Layer (Subcutaneous Tissue) Exposed: No Tendon Exposed: No Muscle Exposed: No Joint Exposed: No Bone Exposed: No Periwound Skin Texture Texture Color No Abnormalities Noted: No No Abnormalities Noted: No Callus: No Atrophie Blanche: No Crepitus: No Cyanosis: No Excoriation: No Ecchymosis: No Induration: No Erythema: No Rash: No Hemosiderin Staining: Yes Scarring: No  Support Surface(s) Assessment (bed, cushion, seat, etc.) INTERVENTIONS - Miscellaneous []  - 0 External ear exam []  - 0 Patient Transfer (multiple staff / Nurse, adult / Similar devices) []  - 0 Simple Staple / Suture removal (25 or less) []  - 0 Complex Staple / Suture removal (26 or more) []  - 0 Hypo/Hyperglycemic Management (do not check if billed separately) X- 1 15 Ankle / Brachial Index (ABI) - do not check if billed separately Has the patient been seen at the hospital within the last three years: Yes Total Score: 115 Level Of Care: New/Established - Level 3 Electronic Signature(s) Signed: 02/07/2023 5:17:24 PM By: Shawn Stall RN, BSN [place], [name] ([id number]) PM By: Shawn Stall RN, BSN [phone].pdf Page 3 of 22 Signed: 02/07/2023 5:17:24 Entered By: Shawn Stall on 02/07/2023 07:28:04 -------------------------------------------------------------------------------- Encounter Discharge Information Details Patient Name: Date of Service: [name], [name] 02/07/2023 9:30 A M Medical Record Number: [id number] Patient Account Number: [id number] Date of Birth/Sex: Treating RN: Apr 14, 1941 (82 y.o. [name] Primary Care Zamya Culhane: Norva Riffle Other Clinician: Referring Willow Reczek: Treating Aaliya Maultsby/Extender: Theodosia Paling in Treatment: 0 Encounter Discharge Information Items Post Procedure Vitals Discharge Condition: Stable Temperature (F): 97.3 Ambulatory Status: Wheelchair Pulse (bpm): 54 Discharge Destination: Home Respiratory Rate (breaths/min):  20 Transportation: Private Auto Blood Pressure (mmHg): 130/65 Accompanied By: self Schedule Follow-up Appointment: Yes Clinical Summary of Care: Electronic Signature(s) Signed: 02/07/2023 5:17:24 PM By: Shawn Stall RN, BSN Entered By: Shawn Stall on 02/07/2023 07:28:51 -------------------------------------------------------------------------------- Lower Extremity Assessment Details Patient Name: Date of Service: ANGELLA, MONTAS Marquez 02/07/2023 9:30 A M Medical Record Number: [id number] Patient Account Number: [id number] Date of Birth/Sex: Treating RN: 04/01/1941 (82 y.o. [name], Millard.Loa Primary Care Bailee Metter: Norva Riffle Other Clinician: Referring Donnis Pecha: Treating Damarian Priola/Extender: Theodosia Paling in Treatment: 0 Edema Assessment Assessed: Kyra Searles: Yes] Franne Forts: Yes] Edema: [Left: No] [Right: No] Calf Left: Right: Point of Measurement: 31 cm From Medial Instep 29 cm 29 cm Ankle Left: Right: Point of Measurement: 10 cm From Medial Instep 18 cm 18 cm Knee To Floor Left: Right: From Medial Instep 40 cm 40 cm Vascular Assessment Pulses: Dorsalis Pedis Palpable: [Left:Yes] [Right:Yes] Doppler Audible: [Left:Yes] [Right:Yes] [name] ([id number]) [Right:[id number].pdf Page 4 of 22] Posterior Tibial Palpable: [Left:Yes] [Right:Yes] Doppler Audible: [Left:Yes] [Right:Yes] Extremity colors, hair growth, and conditions: Extremity Color: [Left:Hyperpigmented] [Right:Hyperpigmented] Hair Growth on Extremity: [Left:No] [Right:No] Temperature of Extremity: [Left:Warm] [Right:Warm] Capillary Refill: [Left:> 3 seconds] [Right:> 3 seconds] Dependent Rubor: [Left:No] [Right:No] Blanched when Elevated: [Left:No] [Right:No] Lipodermatosclerosis: [Left:Yes] [Right:Yes] Blood Pressure: Brachial: [Left:130] Ankle: [Left:Dorsalis Pedis: 50 0.38] [Right:Dorsalis Pedis: 170 1.31] Toe Nail Assessment Left: Right: Thick: Yes  Yes Discolored: Yes Yes Deformed: Yes Yes Improper Length and Hygiene: No Notes monophasic waveforms to left foot DP and PT Right foot DP and PT mulitphasic waveforms. . Electronic Signature(s) Signed: 02/07/2023 5:17:24 PM By: Shawn Stall RN, BSN Entered By: Shawn Stall on 02/07/2023 07:29:07 -------------------------------------------------------------------------------- Multi Wound Chart Details Patient Name: Date of Service: DYNEISHA, MURCHISON Marquez 02/07/2023 9:30 A M Medical Record Number: [id number] Patient Account Number: [id number] Date of Birth/Sex: Treating RN: Feb 24, 1941 (82 y.o. F) Primary Care Dalana Pfahler: Norva Riffle Other Clinician: Referring Naser Schuld: Treating Amarya Kuehl/Extender: Theodosia Paling in Treatment: 0 Vital Signs Height(in): 64 Pulse(bpm): 54 Weight(lbs): 160 Blood Pressure(mmHg): 130/65 Body Mass Index(BMI): 27.5 Temperature(F): 97.3 Respiratory Rate(breaths/min): 20 [10:Photos:] Right, Medial Lower Leg Left, Lateral Lower Leg Left, Proximal, Anterior Lower Leg Wound Location: Blister Blister Blister Wounding Event: Diabetic Wound/Ulcer  MELVIE, PAGLIA ([id number]) [id number] Page 1 of 22 Visit Report for 02/07/2023 Allergy List Details Patient Name: Date of Service: ALEENAH, HOMEN Marquez 02/07/2023 9:30 A M Medical Record Number: [id number] Patient Account Number: [id number] Date of Birth/Sex: Treating RN: 1940-07-01 (82 y.o. [name] Primary Care Kodey Xue: Norva Riffle Other Clinician: Referring Genise Strack: Treating Tyreon Frigon/Extender: Theodosia Paling in Treatment: 0 Allergies Active Allergies amlodipine Allergy Notes Electronic Signature(s) Signed: 02/07/2023 5:17:24 PM By: Shawn Stall RN, BSN Entered By: Shawn Stall on 02/07/2023 05:26:43 -------------------------------------------------------------------------------- Arrival Information Details Patient Name: Date of Service: [name], [name] 02/07/2023 9:30 A M Medical Record Number: [id number] Patient Account Number: [id number] Date of Birth/Sex: Treating RN: 1941-01-23 (82 y.o. [name], Millard.Loa Primary Care Carmine Youngberg: Norva Riffle Other Clinician: Referring Chelcee Korpi: Treating Devone Bonilla/Extender: Theodosia Paling in Treatment: 0 Visit Information Patient Arrived: Wheel Chair Arrival Time: 09:32 Accompanied By: self Transfer Assistance: Manual Patient Identification Verified: Yes Secondary Verification Process Completed: Yes Patient Requires Transmission-Based Precautions: No Patient Has Alerts: No History Since Last Visit Added or deleted any medications: No Any new allergies or adverse reactions: No Had a fall or experienced change in activities of daily living that may affect risk of falls: No Signs or symptoms of abuse/neglect since last visito No Hospitalized since last visit: No Implantable device outside of the clinic excluding cellular tissue based products placed in the center since last visit: No Has Dressing in Place as Prescribed: No Has Compression in Place as  Prescribed: No Notes patient's son in lobby. Electronic Signature(s) Signed: 02/07/2023 5:17:24 PM By: Shawn Stall RN, BSN Entered By: Shawn Stall on 02/07/2023 06:45:11 [name] ([id number]) [id number] Page 2 of 22 -------------------------------------------------------------------------------- Clinic Level of Care Assessment Details Patient Name: Date of Service: [name], [name] 02/07/2023 9:30 A M Medical Record Number: [id number] Patient Account Number: [id number] Date of Birth/Sex: Treating RN: [birth year] (82 y.o. [name], [name] Primary Care Hymen Arnett: Norva Riffle Other Clinician: Referring Madalyn Legner: Treating Thad Osoria/Extender: Theodosia Paling in Treatment: 0 Clinic Level of Care Assessment Items TOOL 1 Quantity Score X- 1 0 Use when EandM and Procedure is performed on INITIAL visit ASSESSMENTS - Nursing Assessment / Reassessment X- 1 20 General Physical Exam (combine w/ comprehensive assessment (listed just below) when performed on new pt. evals) X- 1 25 Comprehensive Assessment (HX, ROS, Risk Assessments, Wounds Hx, etc.) ASSESSMENTS - Wound and Skin Assessment / Reassessment X- 1 10 Dermatologic / Skin Assessment (not related to wound area) ASSESSMENTS - Ostomy and/or Continence Assessment and Care []  - 0 Incontinence Assessment and Management []  - 0 Ostomy Care Assessment and Management (repouching, etc.) PROCESS - Coordination of Care []  - 0 Simple Patient / Family Education for ongoing care X- 1 20 Complex (extensive) Patient / Family Education for ongoing care X- 1 10 Staff obtains Chiropractor, Records, T Results / Process Orders est []  - 0 Staff telephones HHA, Nursing Homes / Clarify orders / etc []  - 0 Routine Transfer to another Facility (non-emergent condition) []  - 0 Routine Hospital Admission (non-emergent condition) X- 1 15 New Admissions / Manufacturing engineer / Ordering NPWT  Apligraf, etc. , []  - 0 Emergency Hospital Admission (emergent condition) PROCESS - Special Needs []  - 0 Pediatric / Minor Patient Management []  - 0 Isolation Patient Management []  - 0 Hearing / Language / Visual special needs []  - 0 Assessment of Community assistance (transportation, D/C planning, etc.) []  - 0 Additional assistance / Altered mentation []  - 0  Secondary Lymphedema Wounding Event: Blister Etiology: Date Acquired: 01/03/2023 Wound Open Weeks Of Treatment: 0 Status: Clustered Wound: No Comorbid Cataracts, Arrhythmia, Congestive Heart Failure, Hypertension, History: Type II Diabetes, End Stage Renal Disease, Neuropathy Photos Wound Measurements Length: (cm) 1.7 Eppinger, Daisa ([id number]) Width: (cm) 1 Depth: (cm) 0.1 Area: (cm) 1.335 Volume: (cm) 0.134 % Reduction in Area: [id number].pdf Page 19 of 22 % Reduction in Volume: Epithelialization: None Tunneling: No Undermining: No Wound Description Classification: Unable to visualize wound bed Wound Margin: Distinct, outline attached Exudate Amount: None Present Foul Odor After Cleansing: No Slough/Fibrino No Wound Bed Granulation Amount: None Present (0%) Exposed Structure Necrotic Amount: Large (67-100%) Fascia Exposed: No Necrotic Quality: Eschar Fat Layer (Subcutaneous Tissue) Exposed: No Tendon Exposed: No Muscle Exposed: No Joint Exposed: No Bone Exposed: No Periwound Skin Texture Texture Color No Abnormalities Noted: No No Abnormalities Noted: No Callus: No Atrophie Blanche: No Crepitus: No Cyanosis: No Excoriation: No Ecchymosis: No Induration: No Erythema: No Rash: No Hemosiderin Staining: Yes Scarring: No Mottled: No Pallor: No Moisture Rubor: No No Abnormalities Noted: No Dry / Scaly: Yes Maceration: No Treatment Notes Wound #14 (Lower Leg) Wound Laterality: Left, Medial Cleanser Soap and Water Discharge Instruction: May shower and wash wound with dial antibacterial soap and water prior to dressing change. Vashe 5.8 (oz) Discharge Instruction: Cleanse the wound with Vashe prior to applying a clean dressing using gauze sponges, not tissue or cotton balls. Peri-Wound  Care Triamcinolone 15 (g) Discharge Instruction: Use triamcinolone 15 (g) as directed Sween Lotion (Moisturizing lotion) Discharge Instruction: Apply moisturizing lotion as directed Topical Primary Dressing Hydrofera Blue Ready Transfer Foam, 2.5x2.5 (in/in) Discharge Instruction: Apply directly to wound bed as directed Secondary Dressing ABD Pad, 8x10 Discharge Instruction: Apply over primary dressing as directed. Secured With Compression Wrap Kerlix Roll 4.5x3.1 (in/yd) Discharge Instruction: Apply Kerlix and Coban compression as directed. Coban Self-Adherent Wrap 4x5 (in/yd) Discharge Instruction: Apply over Kerlix as directed. Compression Stockings Add-Ons Electronic Signature(s) Signed: 02/07/2023 5:17:24 PM By: Shawn Stall RN, BSN Veblen, [name] ([id number]) [id number] Page 20 of 22 Entered By: Shawn Stall on 02/07/2023 07:06:01 -------------------------------------------------------------------------------- Wound Assessment Details Patient Name: Date of Service: [name], [name] 02/07/2023 9:30 A M Medical Record Number: [id number] Patient Account Number: [id number] Date of Birth/Sex: Treating RN: 15-May-1940 (82 y.o. [name], Millard.Loa Primary Care Anjalina Bergevin: Norva Riffle Other Clinician: Referring Detron Carras: Treating Jaxn Chiquito/Extender: Theodosia Paling in Treatment: 0 Wound Status Wound Number: 9 Primary Diabetic Wound/Ulcer of the Lower Extremity Etiology: Wound Location: Right, Lateral Lower Leg Secondary Lymphedema Wounding Event: Blister Etiology: Date Acquired: 01/03/2023 Wound Open Weeks Of Treatment: 0 Status: Clustered Wound: No Comorbid Cataracts, Arrhythmia, Congestive Heart Failure, Hypertension, History: Type II Diabetes, End Stage Renal Disease, Neuropathy Photos Wound Measurements Length: (cm) 0.9 Width: (cm) 1.5 Depth: (cm) 0.1 Area: (cm) 1.06 Volume: (cm) 0.106 % Reduction in Area: %  Reduction in Volume: Epithelialization: None Tunneling: No Undermining: No Wound Description Classification: Unable to visualize wound bed Wound Margin: Distinct, outline attached Exudate Amount: None Present Foul Odor After Cleansing: No Slough/Fibrino No Wound Bed Granulation Amount: None Present (0%) Exposed Structure Necrotic Amount: Large (67-100%) Fascia Exposed: No Necrotic Quality: Eschar Fat Layer (Subcutaneous Tissue) Exposed: No Tendon Exposed: No Muscle Exposed: No Joint Exposed: No Bone Exposed: No Periwound Skin Texture Texture Color No Abnormalities Noted: No No Abnormalities Noted: No Callus: No Atrophie Blanche: No Crepitus: No Cyanosis: No Excoriation: No Ecchymosis: No Induration: No Erythema: No Rash: No Hemosiderin Staining: Yes Scarring: No  Wound Assessment Details Patient Name: Date of Service: [name], [name] 02/07/2023 9:30 A M Medical Record Number: [id number] Patient Account Number: [id number] Date of Birth/Sex: Treating RN: Feb 23, 1941 (82 y.o. [name], Millard.Loa Primary Care Herndon Grill: Norva Riffle Other Clinician: Referring Demonte Dobratz: Treating Marcianna Daily/Extender: Theodosia Paling in Treatment: 0 Wound Status Wound Number: 10 Primary Diabetic Wound/Ulcer of the Lower Extremity Etiology: Wound Location: Right, Medial Lower Leg Secondary Lymphedema Wounding Event: Blister Etiology: Date Acquired: 01/03/2023 Wound Open Weeks Of Treatment: 0 Status: Clustered Wound: No Comorbid Cataracts, Arrhythmia, Congestive Heart Failure, Hypertension, History: Type II Diabetes, End Stage Renal Disease, Neuropathy Photos Wound Measurements Length: (cm) 2 Width: (cm) 1.4 Depth: (cm) 0.1 Area: (cm) 2.199 Volume: (cm) 0.22 % Reduction in Area: % Reduction in Volume: Epithelialization: None Tunneling: No Undermining: No Wound Description Classification: Unable to visualize wound bed Wound Margin: Distinct, outline attached Exudate Amount: None Present Foul Odor After Cleansing: No Slough/Fibrino No Wound Bed Granulation Amount: None Present (0%) Exposed Structure Necrotic Amount: Large (67-100%) Fascia Exposed: No Necrotic Quality: Eschar Fat Layer (Subcutaneous Tissue) Exposed: No Tendon Exposed: No Muscle Exposed: No Joint Exposed: No Bone Exposed: No Periwound Skin Texture Texture Color No Abnormalities Noted: No No Abnormalities Noted: No Callus: No Atrophie Blanche: No Crepitus: No Cyanosis: No Excoriation: No Ecchymosis: No Induration: No Erythema: No Rash: No Hemosiderin Staining: Yes Scarring: No Mottled: No Pallor: No Moisture Rubor: No No Abnormalities Noted: No Dry / Scaly:  Yes Maceration: No SHAQUILLE, MURDY ([id number]) [id number] Page 13 of 22 Treatment Notes Wound #10 (Lower Leg) Wound Laterality: Right, Medial Cleanser Soap and Water Discharge Instruction: May shower and wash wound with dial antibacterial soap and water prior to dressing change. Vashe 5.8 (oz) Discharge Instruction: Cleanse the wound with Vashe prior to applying a clean dressing using gauze sponges, not tissue or cotton balls. Peri-Wound Care Triamcinolone 15 (g) Discharge Instruction: Use triamcinolone 15 (g) as directed Sween Lotion (Moisturizing lotion) Discharge Instruction: Apply moisturizing lotion as directed Topical Primary Dressing Hydrofera Blue Ready Transfer Foam, 2.5x2.5 (in/in) Discharge Instruction: Apply directly to wound bed as directed Secondary Dressing ABD Pad, 8x10 Discharge Instruction: Apply over primary dressing as directed. Secured With Compression Wrap Kerlix Roll 4.5x3.1 (in/yd) Discharge Instruction: Apply Kerlix and Coban compression as directed. Coban Self-Adherent Wrap 4x5 (in/yd) Discharge Instruction: Apply over Kerlix as directed. Compression Stockings Add-Ons Electronic Signature(s) Signed: 02/07/2023 5:17:24 PM By: Shawn Stall RN, BSN Entered By: Shawn Stall on 02/07/2023 06:59:51 -------------------------------------------------------------------------------- Wound Assessment Details Patient Name: Date of Service: [name], [name] 02/07/2023 9:30 A M Medical Record Number: [id number] Patient Account Number: [id number] Date of Birth/Sex: Treating RN: [birth year] (82 y.o. [name], [name] Primary Care Dorina Ribaudo: Norva Riffle Other Clinician: Referring Kella Splinter: Treating Meila Berke/Extender: Theodosia Paling in Treatment: 0 Wound Status Wound Number: 11 Primary Diabetic Wound/Ulcer of the Lower Extremity Etiology: Wound Location: Left, Lateral Lower Leg Secondary Lymphedema Wounding  Event: Blister Etiology: Date Acquired: 01/03/2023 Wound Open Weeks Of Treatment: 0 Status: Clustered Wound: No Comorbid Cataracts, Arrhythmia, Congestive Heart Failure, Hypertension, History: Type II Diabetes, End Stage Renal Disease, Neuropathy Photos LEONETTA, MCGIVERN ([id number]) [id number] Page 14 of 22 Wound Measurements Length: (cm) 2.4 Width: (cm) 4.2 Depth: (cm) 0.1 Area: (cm) 7.917 Volume: (cm) 0.792 % Reduction in Area: % Reduction in Volume: Epithelialization: None Tunneling: No Undermining: No Wound Description Classification: Unable to visualize wound bed Wound Margin: Distinct, outline attached Exudate Amount: None Present Foul Odor After Cleansing: No Slough/Fibrino No Wound  Mottled: No Pallor: No Moisture Rubor: No No Abnormalities Noted: No IXCHEL, DUCK ([id number]) [id number] Page 21 of 22 Dry / Scaly: Yes Maceration: No Treatment Notes Wound #9 (Lower Leg) Wound Laterality: Right, Lateral Cleanser Soap and Water Discharge Instruction: May shower and wash wound with dial antibacterial soap and water prior to dressing change. Vashe 5.8 (oz) Discharge Instruction: Cleanse the wound with Vashe prior to applying a clean dressing using gauze sponges, not tissue or cotton balls. Peri-Wound Care Triamcinolone 15 (g) Discharge Instruction: Use triamcinolone 15 (g) as directed Sween Lotion (Moisturizing lotion) Discharge Instruction: Apply moisturizing lotion as directed Topical Primary Dressing Hydrofera Blue Ready Transfer Foam, 2.5x2.5 (in/in) Discharge Instruction: Apply directly to wound bed as directed Secondary Dressing ABD Pad, 8x10 Discharge Instruction: Apply over primary dressing as directed. Secured With Compression Wrap Kerlix Roll 4.5x3.1 (in/yd) Discharge Instruction: Apply Kerlix and Coban compression as directed. Coban  Self-Adherent Wrap 4x5 (in/yd) Discharge Instruction: Apply over Kerlix as directed. Compression Stockings Add-Ons Electronic Signature(s) Signed: 02/07/2023 5:17:24 PM By: Shawn Stall RN, BSN Entered By: Shawn Stall on 02/07/2023 06:58:43 -------------------------------------------------------------------------------- Vitals Details Patient Name: Date of Service: [name], [name] 02/07/2023 9:30 A M Medical Record Number: [id number] Patient Account Number: [id number] Date of Birth/Sex: Treating RN: November 27, 1940 (82 y.o. [name], Millard.Loa Primary Care Azarias Chiou: Norva Riffle Other Clinician: Referring Dhani Imel: Treating Breandan People/Extender: Theodosia Paling in Treatment: 0 Vital Signs Time Taken: 09:40 Temperature (F): 97.3 Height (in): 64 Pulse (bpm): 54 Source: Stated Respiratory Rate (breaths/min): 20 Weight (lbs): 160 Blood Pressure (mmHg): 130/65 Source: Stated Reference Range: 80 - 120 mg / dl Body Mass Index (BMI): 27.5 Notes granddaughter checks blood glucose for patient every night- forgot the number. IZAMAR, LINDEN ([id number]) [id number] Page 22 of 22 Electronic Signature(s) Signed: 02/07/2023 5:17:24 PM By: Shawn Stall RN, BSN Entered By: Shawn Stall on 02/07/2023 06:46:15  Mottled: No Pallor: No Moisture Rubor: No No Abnormalities Noted: No IXCHEL, DUCK ([id number]) [id number] Page 21 of 22 Dry / Scaly: Yes Maceration: No Treatment Notes Wound #9 (Lower Leg) Wound Laterality: Right, Lateral Cleanser Soap and Water Discharge Instruction: May shower and wash wound with dial antibacterial soap and water prior to dressing change. Vashe 5.8 (oz) Discharge Instruction: Cleanse the wound with Vashe prior to applying a clean dressing using gauze sponges, not tissue or cotton balls. Peri-Wound Care Triamcinolone 15 (g) Discharge Instruction: Use triamcinolone 15 (g) as directed Sween Lotion (Moisturizing lotion) Discharge Instruction: Apply moisturizing lotion as directed Topical Primary Dressing Hydrofera Blue Ready Transfer Foam, 2.5x2.5 (in/in) Discharge Instruction: Apply directly to wound bed as directed Secondary Dressing ABD Pad, 8x10 Discharge Instruction: Apply over primary dressing as directed. Secured With Compression Wrap Kerlix Roll 4.5x3.1 (in/yd) Discharge Instruction: Apply Kerlix and Coban compression as directed. Coban  Self-Adherent Wrap 4x5 (in/yd) Discharge Instruction: Apply over Kerlix as directed. Compression Stockings Add-Ons Electronic Signature(s) Signed: 02/07/2023 5:17:24 PM By: Shawn Stall RN, BSN Entered By: Shawn Stall on 02/07/2023 06:58:43 -------------------------------------------------------------------------------- Vitals Details Patient Name: Date of Service: [name], [name] 02/07/2023 9:30 A M Medical Record Number: [id number] Patient Account Number: [id number] Date of Birth/Sex: Treating RN: November 27, 1940 (82 y.o. [name], Millard.Loa Primary Care Azarias Chiou: Norva Riffle Other Clinician: Referring Dhani Imel: Treating Breandan People/Extender: Theodosia Paling in Treatment: 0 Vital Signs Time Taken: 09:40 Temperature (F): 97.3 Height (in): 64 Pulse (bpm): 54 Source: Stated Respiratory Rate (breaths/min): 20 Weight (lbs): 160 Blood Pressure (mmHg): 130/65 Source: Stated Reference Range: 80 - 120 mg / dl Body Mass Index (BMI): 27.5 Notes granddaughter checks blood glucose for patient every night- forgot the number. IZAMAR, LINDEN ([id number]) [id number] Page 22 of 22 Electronic Signature(s) Signed: 02/07/2023 5:17:24 PM By: Shawn Stall RN, BSN Entered By: Shawn Stall on 02/07/2023 06:46:15

## 2023-02-08 NOTE — Progress Notes (Signed)
aid None/bed rest/wheelchair/nurse 0 Yes Crutches/cane/walker 15 Yes Furniture 0 No Intravenous therapy Access/Saline/Heparin Lock 0 No Gait/Transferring Normal/ bed rest/ wheelchair 0 No Weak (short steps with or without shuffle, stooped but able to lift head while walking, may seek 10 Yes support from furniture) Impaired (short steps with shuffle, may have difficulty arising from chair, head down, impaired 0 No balance) Mental Status Oriented to own ability 0 Yes Electronic Signature(s) Signed: 02/07/2023 5:17:24 PM By: Shawn Stall RN, BSN Entered By: Shawn Stall on 02/07/2023 06:46:42 -------------------------------------------------------------------------------- Foot Assessment Details Patient Name: Date of Service: [name], [name] 02/07/2023 9:30 A M Medical Record Number: [id number] Patient Account Number: [id number] Date of Birth/Sex: Treating RN: 02-Aug-1940 (82 y.o. [name] Primary Care Mintie Witherington: Norva Riffle Other Clinician: Referring Canesha Tesfaye: Treating Aurel Nguyen/Extender: Theodosia Paling in Treatment: 0 Foot Assessment Items Site Locations + = Sensation present, - = Sensation absent, C = Callus, U = Ulcer R = Redness, W = Warmth, M = Maceration, PU = Pre-ulcerative lesion F = Fissure, S = Swelling, D  = Dryness Assessment Right: Left: Other Deformity: No No Prior Foot Ulcer: No No Prior Amputation: No No Charcot Joint: No No Ambulatory Status: Ambulatory With Help Assistance Device: Wheelchair [place], [name] ([id number]) [id number] Page 4 of 4 Gait: Surveyor, mining) Signed: 02/07/2023 5:17:24 PM By: Shawn Stall RN, BSN Entered By: Shawn Stall on 02/07/2023 06:48:05 -------------------------------------------------------------------------------- Nutrition Risk Screening Details Patient Name: Date of Service: [name], [name] 02/07/2023 9:30 A M Medical Record Number: [id number] Patient Account Number: [id number] Date of Birth/Sex: Treating RN: [birth year] (82 y.o. [name], Millard.Loa Primary Care Roniqua Kintz: Norva Riffle Other Clinician: Referring Charlean Carneal: Treating Samantha Ragen/Extender: Theodosia Paling in Treatment: 0 Height (in): 64 Weight (lbs): 160 Body Mass Index (BMI): 27.5 Nutrition Risk Screening Items Score Screening NUTRITION RISK SCREEN: I have an illness or condition that made me change the kind and/or amount of food I eat 2 Yes I eat fewer than two meals per day 0 No I eat few fruits and vegetables, or milk products 0 No I have three or more drinks of beer, liquor or wine almost every day 0 No I have tooth or mouth problems that make it hard for me to eat 0 No I don't always have enough money to buy the food I need 0 No I eat alone most of the time 0 No I take three or more different prescribed or over-the-counter drugs a day 1 Yes Without wanting to, I have lost or gained 10 pounds in the last six months 0 No I am not always physically able to shop, cook and/or feed myself 0 No Nutrition Protocols Good Risk Protocol Provide education on elevated blood Moderate Risk Protocol 0 sugars and impact on wound healing, as applicable High Risk Proctocol Risk Level: Beyond Risk Range Score:  3 Electronic Signature(s) Signed: 02/07/2023 5:17:24 PM By: Shawn Stall RN, BSN Entered By: Shawn Stall on 02/07/2023 06:46:51  KERI, VEALE ([id number]) [id number] Page 1 of 4 Visit Report for 02/07/2023 Abuse Risk Screen Details Patient Name: Date of Service: [name], [name] 02/07/2023 9:30 A M Medical Record Number: [id number] Patient Account Number: [id number] Date of Birth/Sex: Treating RN: 1940/05/15 (82 y.o. [name], [name] Primary Care Traevion Poehler: Norva Riffle Other Clinician: Referring Ariyel Jeangilles: Treating Toby Ayad/Extender: Theodosia Paling in Treatment: 0 Abuse Risk Screen Items Answer ABUSE RISK SCREEN: Has anyone close to you tried to hurt or harm you recentlyo No Do you feel uncomfortable with anyone in your familyo No Has anyone forced you do things that you didnt want to doo No Electronic Signature(s) Signed: 02/07/2023 5:17:24 PM By: Shawn Stall RN, BSN Entered By: Shawn Stall on 02/07/2023 05:27:55 -------------------------------------------------------------------------------- Activities of Daily Living Details Patient Name: Date of Service: [name], [name] 02/07/2023 9:30 A M Medical Record Number: [id number] Patient Account Number: [id number] Date of Birth/Sex: Treating RN: Apr 28, 1941 (82 y.o. [name], [name] Primary Care Leeta Grimme: Norva Riffle Other Clinician: Referring Hymen Arnett: Treating Tachina Spoonemore/Extender: Theodosia Paling in Treatment: 0 Activities of Daily Living Items Answer Activities of Daily Living (Please select one for each item) Drive Automobile Not Able T Medications ake Need Assistance Use T elephone Need Assistance Care for Appearance Need Assistance Use T oilet Need Assistance Bath / Shower Need Assistance Dress Self Need Assistance Feed Self Need Assistance Walk Need Assistance Get In / Out Bed Need Assistance Housework Not Able Prepare Meals Not Able Handle Money Need Assistance Shop for Self Not Able Electronic Signature(s) Signed: 02/07/2023 5:17:24 PM By: Shawn Stall RN, BSN Entered By: Shawn Stall on 02/07/2023 08:65:78 [name] ([id number]) [id number] Page 2 of 4 -------------------------------------------------------------------------------- Education Screening Details Patient Name: Date of Service: [name], [name] 02/07/2023 9:30 A M Medical Record Number: [id number] Patient Account Number: [id number] Date of Birth/Sex: Treating RN: Dec 16, 1940 (82 y.o. [name], [name] Primary Care Dirck Butch: Norva Riffle Other Clinician: Referring Floreine Kingdon: Treating Darrious Youman/Extender: Theodosia Paling in Treatment: 0 Primary Learner Assessed: Patient Learning Preferences/Education Level/Primary Language Learning Preference: Explanation, Demonstration, Printed Material Highest Education Level: High School Preferred Language: English Cognitive Barrier Language Barrier: No Translator Needed: No Memory Deficit: No Emotional Barrier: No Cultural/Religious Beliefs Affecting Medical Care: No Physical Barrier Impaired Vision: No Impaired Hearing: Yes HOH Decreased Hand dexterity: No Knowledge/Comprehension Knowledge Level: High Comprehension Level: High Ability to understand written instructions: High Ability to understand verbal instructions: High Motivation Anxiety Level: Calm Cooperation: Cooperative Education Importance: Acknowledges Need Interest in Health Problems: Asks Questions Perception: Coherent Willingness to Engage in Self-Management High Activities: Readiness to Engage in Self-Management High Activities: Electronic Signature(s) Signed: 02/07/2023 5:17:24 PM By: Shawn Stall RN, BSN Entered By: Shawn Stall on 02/07/2023 05:31:28 -------------------------------------------------------------------------------- Fall Risk Assessment Details Patient Name: Date of Service: [name], [name] 02/07/2023 9:30 A M Medical Record Number: [id number] Patient Account Number:  [id number] Date of Birth/Sex: Treating RN: 1940/05/06 (82 y.o. [name] Primary Care Desirey Keahey: Norva Riffle Other Clinician: Referring Jhayden Demuro: Treating Joneric Streight/Extender: Theodosia Paling in Treatment: 0 Fall Risk Assessment Items Have you had 2 or more falls in the last [address] JELISA, Alba ([id number]) [id number] Page 3 of 4 Have you had any fall that resulted in injury in the last 12 monthso 0 No FALLS RISK SCREEN History of falling - immediate or within 3 months 25 Yes Secondary diagnosis (Do you have 2 or more medical diagnoseso) 0 No Ambulatory

## 2023-02-14 ENCOUNTER — Encounter (HOSPITAL_BASED_OUTPATIENT_CLINIC_OR_DEPARTMENT_OTHER): Payer: Medicare HMO | Admitting: Internal Medicine

## 2023-02-14 DIAGNOSIS — E11622 Type 2 diabetes mellitus with other skin ulcer: Secondary | ICD-10-CM | POA: Diagnosis not present

## 2023-02-15 NOTE — Progress Notes (Signed)
or cotton balls. Peri-Wound Care Triamcinolone 15 (g) Discharge Instruction: Use triamcinolone 15 (g) as directed Sween Lotion (Moisturizing lotion) Discharge Instruction: Apply moisturizing lotion as directed Topical Primary Dressing Hydrofera Blue Ready Transfer Foam, 2.5x2.5  (in/in) Discharge Instruction: Apply directly to wound bed as directed Secondary Dressing ABD Pad, 8x10 Discharge Instruction: Apply over primary dressing as directed. Secured With Compression Wrap Kerlix Roll 4.5x3.1 (in/yd) Discharge Instruction: Apply Kerlix and Coban compression as directed. Coban Self-Adherent Wrap 4x5 (in/yd) Discharge Instruction: Apply over Kerlix as directed. Compression Stockings Add-Ons MARRA, FRAGA ([id number]) [id number] Page 8 of 18 Notes tubigrip size D single layer applied to right leg as directed by Yaron Grasse. Wound #9 (Lower Leg) Wound Laterality: Right, Lateral Cleanser Peri-Wound Care Topical Primary Dressing Secondary Dressing Secured With Compression Wrap Compression Stockings Add-Ons Notes tubigrip size D single layer applied to right leg as directed by Manuelita Moxon. Electronic Signature(s) Signed: 02/14/2023 4:41:28 PM By: Baltazar Najjar MD Entered By: Baltazar Najjar on 02/14/2023 06:02:17 -------------------------------------------------------------------------------- Multi-Disciplinary Care Plan Details Patient Name: Date of Service: [name] 02/14/2023 8:00 A M Medical Record Number: [id number] Patient Account Number: [id number] Date of Birth/Sex: Treating RN: [birth year] (82 y.o. [name], [name] Primary Care Jamilynn Whitacre: Norva Riffle Other Clinician: Referring Cayleb Jarnigan: Treating Aithan Farrelly/Extender: Theodosia Paling in Treatment: 1 Active Inactive Necrotic Tissue Nursing Diagnoses: Knowledge deficit related to management of necrotic/devitalized tissue Goals: Necrotic/devitalized tissue will be minimized in the wound bed Date Initiated: 02/07/2023 Target Resolution Date: 03/10/2023 Goal Status: Active Interventions: Assess patient pain level pre-, during and post procedure and prior to discharge Treatment Activities: Apply topical anesthetic as ordered :  02/07/2023 Notes: Pain, Acute or Chronic Nursing Diagnoses: Pain, acute or chronic: actual or potential Goals: Patient will verbalize adequate pain control and receive pain control interventions during procedures as needed Date Initiated: 02/07/2023 Target Resolution Date: 03/11/2023 Goal Status: Active LURENE, ROBLEY ([id number]) [id number] Page 9 of 18 Interventions: Encourage patient to take pain medications as prescribed Provide education on pain management Treatment Activities: Administer pain control measures as ordered : 02/07/2023 Notes: Tissue Oxygenation Nursing Diagnoses: Knowledge deficit related to disease process and management Potential alteration in peripheral tissue perfusion (select prior to confirmation of diagnosis) Goals: Non-invasive arterial studies are completed as ordered Date Initiated: 02/07/2023 Target Resolution Date: 03/11/2023 Goal Status: Active Interventions: Assess patient understanding of disease process and management upon diagnosis and as needed Assess peripheral arterial status upon admission and as needed Provide education on tissue oxygenation and ischemia Treatment Activities: Ankle Brachial Index (ABI) : 02/07/2023 Non-invasive vascular studies : 02/07/2023 Notes: Electronic Signature(s) Signed: 02/14/2023 5:13:31 PM By: Shawn Stall RN, BSN Entered By: Shawn Stall on 02/14/2023 05:38:52 -------------------------------------------------------------------------------- Pain Assessment Details Patient Name: Date of Service: [name] 02/14/2023 8:00 A M Medical Record Number: [id number] Patient Account Number: [id number] Date of Birth/Sex: Treating RN: 12/25/1940 (82 y.o. [name] Primary Care Abrish Erny: Norva Riffle Other Clinician: Referring Bohden Dung: Treating Marnisha Stampley/Extender: Theodosia Paling in Treatment: 1 Active Problems Location of Pain Severity and Description  of Pain Patient Has Paino No Site Locations [place], [place] ([id number]) [id number] Page 10 of 18 Pain Management and Medication Current Pain Management: Electronic Signature(s) Signed: 02/14/2023 5:13:31 PM By: Shawn Stall RN, BSN Entered By: Shawn Stall on 02/14/2023 05:23:48 -------------------------------------------------------------------------------- Patient/Caregiver Education Details Patient Name: Date of Service: [name] 10/14/2024andnbsp8:00 A M Medical Record Number: [id number] Patient Account Number: [id number] Date of Birth/Gender: Treating RN: [birth year] (82 y.o. [name], [name] Primary  LINNETTE, PANELLA ([id number]) [id number] Page 1 of 18 Visit Report for 02/14/2023 Arrival Information Details Patient Name: Date of Service: [name] 02/14/2023 8:00 A M Medical Record Number: [id number] Patient Account Number: [id number] Date of Birth/Sex: Treating RN: 1940-10-16 (82 y.o. [name], [name] Primary Care Jaylee Lantry: Norva Riffle Other Clinician: Referring Izaia Say: Treating Aziyah Provencal/Extender: Theodosia Paling in Treatment: 1 Visit Information History Since Last Visit Added or deleted any medications: No Patient Arrived: Wheel Chair Any new allergies or adverse reactions: No Arrival Time: 08:15 Had a fall or experienced change in No Accompanied By: daughter activities of daily living that may affect Transfer Assistance: Manual risk of falls: Patient Identification Verified: Yes Signs or symptoms of abuse/neglect since last visito No Secondary Verification Process Completed: Yes Hospitalized since last visit: No Patient Requires Transmission-Based Precautions: No Implantable device outside of the clinic excluding No Patient Has Alerts: No cellular tissue based products placed in the center since last visit: Has Dressing in Place as Prescribed: Yes Has Compression in Place as Prescribed: Yes Pain Present Now: No Electronic Signature(s) Signed: 02/14/2023 5:13:31 PM By: Shawn Stall RN, BSN Entered By: Shawn Stall on 02/14/2023 05:23:30 -------------------------------------------------------------------------------- Clinic Level of Care Assessment Details Patient Name: Date of Service: EMILEA, GOGA Marquez 02/14/2023 8:00 A M Medical Record Number: [id number] Patient Account Number: [id number] Date of Birth/Sex: Treating RN: [birth year] (82 y.o. [name] Primary Care Mallorey Odonell: Norva Riffle Other Clinician: Referring Elita Dame: Treating Dulcinea Kinser/Extender: Theodosia Paling in  Treatment: 1 Clinic Level of Care Assessment Items TOOL 4 Quantity Score X- 1 0 Use when only an EandM is performed on FOLLOW-UP visit ASSESSMENTS - Nursing Assessment / Reassessment X- 1 10 Reassessment of Co-morbidities (includes updates in patient status) X- 1 5 Reassessment of Adherence to Treatment Plan ASSESSMENTS - Wound and Skin A ssessment / Reassessment []  - 0 Simple Wound Assessment / Reassessment - one wound X- 6 5 Complex Wound Assessment / Reassessment - multiple wounds X- 1 10 Dermatologic / Skin Assessment (not related to wound area) ASSESSMENTS - Focused Assessment X- 2 5 Circumferential Edema Measurements - multi extremities []  - 0 Nutritional Assessment / Counseling / Intervention VELA, RENDER ([id number]) [id number] Page 2 of 18 []  - 0 Lower Extremity Assessment (monofilament, tuning fork, pulses) []  - 0 Peripheral Arterial Disease Assessment (using hand held doppler) ASSESSMENTS - Ostomy and/or Continence Assessment and Care []  - 0 Incontinence Assessment and Management []  - 0 Ostomy Care Assessment and Management (repouching, etc.) PROCESS - Coordination of Care []  - 0 Simple Patient / Family Education for ongoing care X- 1 20 Complex (extensive) Patient / Family Education for ongoing care X- 1 10 Staff obtains Chiropractor, Records, T Results / Process Orders est []  - 0 Staff telephones HHA, Nursing Homes / Clarify orders / etc []  - 0 Routine Transfer to another Facility (non-emergent condition) []  - 0 Routine Hospital Admission (non-emergent condition) []  - 0 New Admissions / Manufacturing engineer / Ordering NPWT Apligraf, etc. , []  - 0 Emergency Hospital Admission (emergent condition) []  - 0 Simple Discharge Coordination X- 1 15 Complex (extensive) Discharge Coordination PROCESS - Special Needs []  - 0 Pediatric / Minor Patient Management []  - 0 Isolation Patient Management []  - 0 Hearing / Language /  Visual special needs []  - 0 Assessment of Community assistance (transportation, D/C planning, etc.) []  - 0 Additional assistance / Altered mentation []  - 0 Support Surface(s) Assessment (bed, cushion, seat, etc.) INTERVENTIONS - Wound Cleansing /  Measurement []  - 0 Simple Wound Cleansing - one wound X- 6 5 Complex Wound Cleansing - multiple wounds X- 1 5 Wound Imaging (photographs - any number of wounds) []  - 0 Wound Tracing (instead of photographs) []  - 0 Simple Wound Measurement - one wound X- 6 5 Complex Wound Measurement - multiple wounds INTERVENTIONS - Wound Dressings []  - 0 Small Wound Dressing one or multiple wounds []  - 0 Medium Wound Dressing one or multiple wounds X- 1 20 Large Wound Dressing one or multiple wounds []  - 0 Application of Medications - topical []  - 0 Application of Medications - injection INTERVENTIONS - Miscellaneous []  - 0 External ear exam []  - 0 Specimen Collection (cultures, biopsies, blood, body fluids, etc.) []  - 0 Specimen(s) / Culture(s) sent or taken to Lab for analysis []  - 0 Patient Transfer (multiple staff / Nurse, adult / Similar devices) []  - 0 Simple Staple / Suture removal (25 or less) []  - 0 Complex Staple / Suture removal (26 or more) []  - 0 Hypo / Hyperglycemic Management (close monitor of Blood Glucose) Wienke, [name] ([id number]) [phone].pdf Page 3 of 18 []  - 0 Ankle / Brachial Index (ABI) - do not check if billed separately X- 1 5 Vital Signs Has the patient been seen at the hospital within the last three years: Yes Total Score: 200 Level Of Care: New/Established - Level 5 Electronic Signature(s) Signed: 02/14/2023 5:13:31 PM By: Shawn Stall RN, BSN Entered By: Shawn Stall on 02/14/2023 05:56:54 -------------------------------------------------------------------------------- Encounter Discharge Information Details Patient Name: Date of Service: Milon Score A Marquez 02/14/2023 8:00 A  M Medical Record Number: [id number] Patient Account Number: [id number] Date of Birth/Sex: Treating RN: 07-Aug-1940 (82 y.o. [name], [name] Primary Care Jerica Creegan: Norva Riffle Other Clinician: Referring Jahlon Baines: Treating Shalanda Brogden/Extender: Theodosia Paling in Treatment: 1 Encounter Discharge Information Items Discharge Condition: Stable Ambulatory Status: Wheelchair Discharge Destination: Home Transportation: Private Auto Accompanied By: daughter Schedule Follow-up Appointment: Yes Clinical Summary of Care: Patient Declined Electronic Signature(s) Signed: 02/14/2023 5:13:31 PM By: Shawn Stall RN, BSN Entered By: Shawn Stall on 02/14/2023 05:57:51 -------------------------------------------------------------------------------- Lower Extremity Assessment Details Patient Name: Date of Service: SOPHIAMARIE, NEASE Marquez 02/14/2023 8:00 A M Medical Record Number: [id number] Patient Account Number: [id number] Date of Birth/Sex: Treating RN: June 04, 1940 (82 y.o. [name] Primary Care Glendal Cassaday: Norva Riffle Other Clinician: Referring Lacreshia Bondarenko: Treating Viha Kriegel/Extender: Theodosia Paling in Treatment: 1 Edema Assessment Assessed: Kyra Searles: Yes] Franne Forts: Yes] Edema: [Left: No] [Right: No] Calf Left: Right: Point of Measurement: 31 cm From Medial Instep 26 cm 29 cm Ankle Left: Right: Point of Measurement: 10 cm From Medial Instep 18 cm 18 cm Vascular Assessment Left: [[id number] Page 4 of 18Right:] Pulses: Dorsalis Pedis Palpable: [phone].pdf Page 4 of 18Yes Yes] Extremity colors, hair growth, and conditions: Extremity Color: [phone].pdf Page 4 of 18Hyperpigmented Hyperpigmented] Hair Growth on Extremity: [phone].pdf Page 4 of 18No No] Temperature of Extremity: [phone].pdf Page 4 of 18Warm  Warm] Capillary Refill: [phone].pdf Page 4 of 18> 3 seconds > 3 seconds] Dependent Rubor: [phone].pdf Page 4 of 18No No] Blanched when Elevated: [phone].pdf Page 4 of 18No No Yes Yes] Toe Nail Assessment Left: Right: Thick: Yes Yes Discolored: Yes Yes Deformed: Yes Yes Improper Length and Hygiene: No No Electronic Signature(s) Signed: 02/14/2023 5:13:31 PM By: Shawn Stall RN, BSN Entered By: Shawn Stall on 02/14/2023 05:34:25 -------------------------------------------------------------------------------- Multi Wound Chart Details Patient Name: Date of Service: Milon Score A Marquez 02/14/2023 8:00 A M Medical Record Number:  No Moisture Rubor: No No Abnormalities Noted: No Dry / Scaly: Yes Maceration: No Electronic Signature(s) Signed: 02/14/2023 5:13:31 PM By: Shawn Stall RN, BSN Entered By: Shawn Stall on 02/14/2023 05:35:51 -------------------------------------------------------------------------------- Wound Assessment Details Patient Name: Date of Service: [name], [name] 02/14/2023 8:00 A M Medical Record Number: [id number] Patient Account Number: [id number] Date of Birth/Sex: Treating RN: [birth year] (82 y.o. [name] Primary  Care Avia Merkley: Norva Riffle Other Clinician: Referring Yehudis Monceaux: Treating Vivian Okelley/Extender: Charlyne Quale Raiford, [name] ([id number]) [id number] Page 13 of 18 Weeks in Treatment: 1 Wound Status Wound Number: 12 Primary Diabetic Wound/Ulcer of the Lower Extremity Etiology: Wound Location: Left, Proximal, Anterior Lower Leg Secondary Lymphedema Wounding Event: Blister Etiology: Date Acquired: 01/03/2023 Wound Open Weeks Of Treatment: 1 Status: Clustered Wound: No Comorbid Cataracts, Arrhythmia, Congestive Heart Failure, Hypertension, History: Type II Diabetes, End Stage Renal Disease, Neuropathy Photos Wound Measurements Length: (cm) Width: (cm) Depth: (cm) Area: (cm) Volume: (cm) 0 % Reduction in Area: 100% 0 % Reduction in Volume: 100% 0 Epithelialization: Large (67-100%) 0 Tunneling: No 0 Undermining: No Wound Description Classification: Unable to visualize wound bed Wound Margin: Distinct, outline attached Exudate Amount: None Present Foul Odor After Cleansing: No Slough/Fibrino No Wound Bed Granulation Amount: None Present (0%) Exposed Structure Necrotic Amount: None Present (0%) Fascia Exposed: No Fat Layer (Subcutaneous Tissue) Exposed: No Tendon Exposed: No Muscle Exposed: No Joint Exposed: No Bone Exposed: No Periwound Skin Texture Texture Color No Abnormalities Noted: No No Abnormalities Noted: No Callus: No Atrophie Blanche: No Crepitus: No Cyanosis: No Excoriation: No Ecchymosis: No Induration: No Erythema: No Rash: No Hemosiderin Staining: Yes Scarring: No Mottled: No Pallor: No Moisture Rubor: No No Abnormalities Noted: No Dry / Scaly: Yes Maceration: No Electronic Signature(s) Signed: 02/14/2023 5:13:31 PM By: Shawn Stall RN, BSN Entered By: Shawn Stall on 02/14/2023 05:36:45 [name] ([id number]) [id number].pdf Page 14 of  18 -------------------------------------------------------------------------------- Wound Assessment Details Patient Name: Date of Service: EMELIE, [name] 02/14/2023 8:00 A M Medical Record Number: [id number] Patient Account Number: [id number] Date of Birth/Sex: Treating RN: [birth year] (82 y.o. [name], Millard.Loa Primary Care Marty Sadlowski: Norva Riffle Other Clinician: Referring Davian Wollenberg: Treating Kaj Vasil/Extender: Theodosia Paling in Treatment: 1 Wound Status Wound Number: 13 Primary Diabetic Wound/Ulcer of the Lower Extremity Etiology: Wound Location: Left, Distal, Anterior Lower Leg Secondary Lymphedema Wounding Event: Blister Etiology: Date Acquired: 01/03/2023 Wound Open Weeks Of Treatment: 1 Status: Clustered Wound: No Comorbid Cataracts, Arrhythmia, Congestive Heart Failure, Hypertension, History: Type II Diabetes, End Stage Renal Disease, Neuropathy Photos Wound Measurements Length: (cm) 5 Width: (cm) 1 Depth: (cm) 0 Area: (cm) Volume: (cm) % Reduction in Area: 22.2% .4 % Reduction in Volume: 22.2% .1 Epithelialization: Large (67-100%) 5.498 Tunneling: No 0.55 Undermining: No Wound Description Classification: Unable to visualize wound bed Wound Margin: Distinct, outline attached Exudate Amount: Small Exudate Type: Serosanguineous Exudate Color: red, brown Foul Odor After Cleansing: No Slough/Fibrino Yes Wound Bed Granulation Amount: Large (67-100%) Exposed Structure Granulation Quality: Red, Pink Fascia Exposed: No Necrotic Amount: Small (1-33%) Fat Layer (Subcutaneous Tissue) Exposed: Yes Necrotic Quality: Eschar Tendon Exposed: No Muscle Exposed: No Joint Exposed: No Bone Exposed: No Periwound Skin Texture Texture Color No Abnormalities Noted: No No Abnormalities Noted: No Callus: No Atrophie Blanche: No Crepitus: No Cyanosis: No Excoriation: No Ecchymosis: No Induration: No Erythema: No Rash: No Hemosiderin Staining:  Yes Scarring: No Mottled: No Pallor: No Moisture Rubor: No No Abnormalities Noted: No Dry / Scaly: Yes Maceration: No Treatment Notes  Care Physician: Norva Riffle Other Clinician: Referring Physician: Treating Physician/Extender: Theodosia Paling in Treatment: 1 Education Assessment Education Provided To: Patient Education Topics Provided Wound/Skin Impairment: Handouts: Caring for Your Ulcer Methods: Explain/Verbal Responses: Reinforcements needed Electronic Signature(s) Signed: 02/14/2023 5:13:31 PM By: Shawn Stall RN, BSN Entered By: Shawn Stall on 02/14/2023 05:39:02 -------------------------------------------------------------------------------- Wound Assessment Details Patient Name: Date of Service: [name], [name] 02/14/2023 8:00 A M Medical Record Number: [id number] Patient Account Number: [id number] Date of Birth/Sex: Treating RN: [birth year] (82 y.o. [name], [name] Primary Care Mylo Choi: Norva Riffle Other Clinician: Referring Gavon Majano: Treating Goran Olden/Extender: Theodosia Paling in Treatment: 1 Wound Status Wound Number: 10 Primary Diabetic Wound/Ulcer of the Lower Extremity Etiology: Wound Location: Right, Medial Lower Leg Secondary Lymphedema Wounding Event: Blister Etiology: Date Acquired: 01/03/2023 Wound Open Weeks Of Treatment: 1 Status: Clustered Wound: No Comorbid Cataracts, Arrhythmia,  Congestive Heart Failure, Hypertension, History: Type II Diabetes, End Stage Renal Disease, Neuropathy Photos JHANIYA, BRISKI ([id number]) [id number] Page 11 of 18 Wound Measurements Length: (cm) Width: (cm) Depth: (cm) Area: (cm) Volume: (cm) 0 % Reduction in Area: 100% 0 % Reduction in Volume: 100% 0 Epithelialization: Large (67-100%) 0 Tunneling: No 0 Undermining: No Wound Description Classification: Unable to visualize wound bed Wound Margin: Distinct, outline attached Exudate Amount: None Present Foul Odor After Cleansing: No Slough/Fibrino No Wound Bed Granulation Amount: None Present (0%) Exposed Structure Necrotic Amount: None Present (0%) Fascia Exposed: No Fat Layer (Subcutaneous Tissue) Exposed: No Tendon Exposed: No Muscle Exposed: No Joint Exposed: No Bone Exposed: No Periwound Skin Texture Texture Color No Abnormalities Noted: No No Abnormalities Noted: No Callus: No Atrophie Blanche: No Crepitus: No Cyanosis: No Excoriation: No Ecchymosis: No Induration: No Erythema: No Rash: No Hemosiderin Staining: Yes Scarring: No Mottled: No Pallor: No Moisture Rubor: No No Abnormalities Noted: No Dry / Scaly: Yes Temperature / Pain Maceration: No Temperature: No Abnormality Electronic Signature(s) Signed: 02/14/2023 5:13:31 PM By: Shawn Stall RN, BSN Entered By: Shawn Stall on 02/14/2023 05:35:18 -------------------------------------------------------------------------------- Wound Assessment Details Patient Name: Date of Service: [name], [name] 02/14/2023 8:00 A M Medical Record Number: [id number] Patient Account Number: [id number] Date of Birth/Sex: Treating RN: 1940-11-28 (82 y.o. [name] Primary Care Ramy Greth: Norva Riffle Other Clinician: Referring Orla Jolliff: Treating Johnatha Zeidman/Extender: Theodosia Paling in Treatment: 1 Wound Status Wound Number: 11 Primary Diabetic Wound/Ulcer  of the Lower Extremity Etiology: Wound Location: Left, Lateral Lower Leg Secondary Lymphedema Wounding Event: Blister Etiology: DEVONA, HOLMES ([id number]) [id number] Page 12 of 18 Etiology: Date Acquired: 01/03/2023 Wound Open Weeks Of Treatment: 1 Status: Clustered Wound: No Comorbid Cataracts, Arrhythmia, Congestive Heart Failure, Hypertension, History: Type II Diabetes, End Stage Renal Disease, Neuropathy Photos Wound Measurements Length: (cm) Width: (cm) Depth: (cm) Area: (cm) Volume: (cm) 0 % Reduction in Area: 100% 0 % Reduction in Volume: 100% 0 Epithelialization: Large (67-100%) 0 Tunneling: No 0 Undermining: No Wound Description Classification: Unable to visualize wound bed Wound Margin: Distinct, outline attached Exudate Amount: None Present Foul Odor After Cleansing: No Slough/Fibrino No Wound Bed Granulation Amount: None Present (0%) Exposed Structure Necrotic Amount: None Present (0%) Fascia Exposed: No Fat Layer (Subcutaneous Tissue) Exposed: No Tendon Exposed: No Muscle Exposed: No Joint Exposed: No Bone Exposed: No Periwound Skin Texture Texture Color No Abnormalities Noted: No No Abnormalities Noted: No Callus: No Atrophie Blanche: No Crepitus: No Cyanosis: No Excoriation: No Ecchymosis: No Induration: No Erythema: No Rash: No Hemosiderin Staining: Yes Scarring: No Mottled: No Pallor:  No Moisture Rubor: No No Abnormalities Noted: No Dry / Scaly: Yes Maceration: No Electronic Signature(s) Signed: 02/14/2023 5:13:31 PM By: Shawn Stall RN, BSN Entered By: Shawn Stall on 02/14/2023 05:35:51 -------------------------------------------------------------------------------- Wound Assessment Details Patient Name: Date of Service: [name], [name] 02/14/2023 8:00 A M Medical Record Number: [id number] Patient Account Number: [id number] Date of Birth/Sex: Treating RN: [birth year] (82 y.o. [name] Primary  Care Avia Merkley: Norva Riffle Other Clinician: Referring Yehudis Monceaux: Treating Vivian Okelley/Extender: Charlyne Quale Raiford, [name] ([id number]) [id number] Page 13 of 18 Weeks in Treatment: 1 Wound Status Wound Number: 12 Primary Diabetic Wound/Ulcer of the Lower Extremity Etiology: Wound Location: Left, Proximal, Anterior Lower Leg Secondary Lymphedema Wounding Event: Blister Etiology: Date Acquired: 01/03/2023 Wound Open Weeks Of Treatment: 1 Status: Clustered Wound: No Comorbid Cataracts, Arrhythmia, Congestive Heart Failure, Hypertension, History: Type II Diabetes, End Stage Renal Disease, Neuropathy Photos Wound Measurements Length: (cm) Width: (cm) Depth: (cm) Area: (cm) Volume: (cm) 0 % Reduction in Area: 100% 0 % Reduction in Volume: 100% 0 Epithelialization: Large (67-100%) 0 Tunneling: No 0 Undermining: No Wound Description Classification: Unable to visualize wound bed Wound Margin: Distinct, outline attached Exudate Amount: None Present Foul Odor After Cleansing: No Slough/Fibrino No Wound Bed Granulation Amount: None Present (0%) Exposed Structure Necrotic Amount: None Present (0%) Fascia Exposed: No Fat Layer (Subcutaneous Tissue) Exposed: No Tendon Exposed: No Muscle Exposed: No Joint Exposed: No Bone Exposed: No Periwound Skin Texture Texture Color No Abnormalities Noted: No No Abnormalities Noted: No Callus: No Atrophie Blanche: No Crepitus: No Cyanosis: No Excoriation: No Ecchymosis: No Induration: No Erythema: No Rash: No Hemosiderin Staining: Yes Scarring: No Mottled: No Pallor: No Moisture Rubor: No No Abnormalities Noted: No Dry / Scaly: Yes Maceration: No Electronic Signature(s) Signed: 02/14/2023 5:13:31 PM By: Shawn Stall RN, BSN Entered By: Shawn Stall on 02/14/2023 05:36:45 [name] ([id number]) [id number] Page 14 of  18 -------------------------------------------------------------------------------- Wound Assessment Details Patient Name: Date of Service: EMELIE, [name] 02/14/2023 8:00 A M Medical Record Number: [id number] Patient Account Number: [id number] Date of Birth/Sex: Treating RN: [birth year] (82 y.o. [name], Millard.Loa Primary Care Marty Sadlowski: Norva Riffle Other Clinician: Referring Davian Wollenberg: Treating Kaj Vasil/Extender: Theodosia Paling in Treatment: 1 Wound Status Wound Number: 13 Primary Diabetic Wound/Ulcer of the Lower Extremity Etiology: Wound Location: Left, Distal, Anterior Lower Leg Secondary Lymphedema Wounding Event: Blister Etiology: Date Acquired: 01/03/2023 Wound Open Weeks Of Treatment: 1 Status: Clustered Wound: No Comorbid Cataracts, Arrhythmia, Congestive Heart Failure, Hypertension, History: Type II Diabetes, End Stage Renal Disease, Neuropathy Photos Wound Measurements Length: (cm) 5 Width: (cm) 1 Depth: (cm) 0 Area: (cm) Volume: (cm) % Reduction in Area: 22.2% .4 % Reduction in Volume: 22.2% .1 Epithelialization: Large (67-100%) 5.498 Tunneling: No 0.55 Undermining: No Wound Description Classification: Unable to visualize wound bed Wound Margin: Distinct, outline attached Exudate Amount: Small Exudate Type: Serosanguineous Exudate Color: red, brown Foul Odor After Cleansing: No Slough/Fibrino Yes Wound Bed Granulation Amount: Large (67-100%) Exposed Structure Granulation Quality: Red, Pink Fascia Exposed: No Necrotic Amount: Small (1-33%) Fat Layer (Subcutaneous Tissue) Exposed: Yes Necrotic Quality: Eschar Tendon Exposed: No Muscle Exposed: No Joint Exposed: No Bone Exposed: No Periwound Skin Texture Texture Color No Abnormalities Noted: No No Abnormalities Noted: No Callus: No Atrophie Blanche: No Crepitus: No Cyanosis: No Excoriation: No Ecchymosis: No Induration: No Erythema: No Rash: No Hemosiderin Staining:  Yes Scarring: No Mottled: No Pallor: No Moisture Rubor: No No Abnormalities Noted: No Dry / Scaly: Yes Maceration: No Treatment Notes  [id number] Patient Account Number: [id number] Date of Birth/Sex: Treating RN: Mar 25, 1941 (82 y.o. F) Primary Care Dezmond Downie: Norva Riffle Other Clinician: Referring Talmage Teaster: Treating Lacey Dotson/Extender: Theodosia Paling in Treatment: 1 Vital Signs Height(in): 64 Pulse(bpm): 76 Weight(lbs): 160 Blood Pressure(mmHg): 108/70 Body Mass Index(BMI): 27.5 Temperature(F): 99 Respiratory Rate(breaths/min): 20 [10:Photos:] Right, Medial Lower Leg Left, Lateral Lower Leg Left, Proximal, Anterior Lower Leg Wound Location: Blister Blister Blister Wounding Event: Diabetic Wound/Ulcer of the Lower Diabetic Wound/Ulcer of the Lower Diabetic Wound/Ulcer of the Lower Primary Etiology: Extremity Extremity Extremity Lymphedema Lymphedema Lymphedema Secondary Etiology: Cataracts, Arrhythmia, Congestive Cataracts, Arrhythmia, Congestive Cataracts, Arrhythmia, Congestive Comorbid History: Heart Failure, Hypertension, Type II Heart Failure, Hypertension, Type II Heart Failure, Hypertension, Type II Diabetes, End Stage Renal Disease, Diabetes, End Stage Renal Disease, Diabetes, End Stage Renal Disease, Neuropathy Neuropathy Neuropathy 01/03/2023 01/03/2023 01/03/2023 Date  Acquired: 1 1 1  Weeks of Treatment: Open Open Open Wound Status: No No No Wound Recurrence: 0x0x0 0x0x0 0x0x0 Measurements L x W x D (cm) 0 0 0 A (cm) : rea 0 0 0 Volume (cm) : 100.00% 100.00% 100.00% % Reduction in Area: [place], [name] ([id number]) [id number] Page 5 of 18 100.00% 100.00% 100.00% % Reduction in Volume: Unable to visualize wound bed Unable to visualize wound bed Unable to visualize wound bed Classification: None Present None Present None Present Exudate A mount: N/A N/A N/A Exudate Type: N/A N/A N/A Exudate Color: Distinct, outline attached Distinct, outline attached Distinct, outline attached Wound Margin: None Present (0%) None Present (0%) None Present (0%) Granulation A mount: N/A N/A N/A Granulation Quality: None Present (0%) None Present (0%) None Present (0%) Necrotic A mount: N/A N/A N/A Necrotic Tissue: Fascia: No Fascia: No Fascia: No Exposed Structures: Fat Layer (Subcutaneous Tissue): No Fat Layer (Subcutaneous Tissue): No Fat Layer (Subcutaneous Tissue): No Tendon: No Tendon: No Tendon: No Muscle: No Muscle: No Muscle: No Joint: No Joint: No Joint: No Bone: No Bone: No Bone: No Large (67-100%) Large (67-100%) Large (67-100%) Epithelialization: Excoriation: No Excoriation: No Excoriation: No Periwound Skin Texture: Induration: No Induration: No Induration: No Callus: No Callus: No Callus: No Crepitus: No Crepitus: No Crepitus: No Rash: No Rash: No Rash: No Scarring: No Scarring: No Scarring: No Dry/Scaly: Yes Dry/Scaly: Yes Dry/Scaly: Yes Periwound Skin Moisture: Maceration: No Maceration: No Maceration: No Hemosiderin Staining: Yes Hemosiderin Staining: Yes Hemosiderin Staining: Yes Periwound Skin Color: Atrophie Blanche: No Atrophie Blanche: No Atrophie Blanche: No Cyanosis: No Cyanosis: No Cyanosis: No Ecchymosis: No Ecchymosis: No Ecchymosis: No Erythema:  No Erythema: No Erythema: No Mottled: No Mottled: No Mottled: No Pallor: No Pallor: No Pallor: No Rubor: No Rubor: No Rubor: No No Abnormality N/A N/A Temperature: Wound Number: [id number]  Photos: Left, Distal, Anterior Lower Leg Left, Medial Lower Leg Right, Lateral Lower Leg Wound Location: Blister Blister Blister Wounding Event: Diabetic Wound/Ulcer of the Lower Diabetic Wound/Ulcer of the Lower Diabetic Wound/Ulcer of the Lower Primary Etiology: Extremity Extremity Extremity Lymphedema Lymphedema Lymphedema Secondary Etiology: Cataracts, Arrhythmia, Congestive Cataracts, Arrhythmia, Congestive Cataracts, Arrhythmia, Congestive Comorbid History: Heart Failure, Hypertension, Type II Heart Failure, Hypertension, Type II Heart Failure, Hypertension, Type II Diabetes, End Stage Renal Disease, Diabetes, End Stage Renal Disease, Diabetes, End Stage Renal Disease, Neuropathy Neuropathy Neuropathy 01/03/2023 01/03/2023 01/03/2023 Date Acquired: 1 1 1  Weeks of Treatment: Open Open Open Wound Status: No No No Wound Recurrence: 5x1.4x0.1 1x0.3x0.1 0x0x0 Measurements L x W x D (cm) 5.498 0.236 0 A (cm) : rea 0.55 0.024 0 Volume (cm) :  No Moisture Rubor: No No Abnormalities Noted: No Dry / Scaly: Yes Maceration: No Electronic Signature(s) Signed: 02/14/2023 5:13:31 PM By: Shawn Stall RN, BSN Entered By: Shawn Stall on 02/14/2023 05:35:51 -------------------------------------------------------------------------------- Wound Assessment Details Patient Name: Date of Service: [name], [name] 02/14/2023 8:00 A M Medical Record Number: [id number] Patient Account Number: [id number] Date of Birth/Sex: Treating RN: [birth year] (82 y.o. [name] Primary  Care Avia Merkley: Norva Riffle Other Clinician: Referring Yehudis Monceaux: Treating Vivian Okelley/Extender: Charlyne Quale Raiford, [name] ([id number]) [id number] Page 13 of 18 Weeks in Treatment: 1 Wound Status Wound Number: 12 Primary Diabetic Wound/Ulcer of the Lower Extremity Etiology: Wound Location: Left, Proximal, Anterior Lower Leg Secondary Lymphedema Wounding Event: Blister Etiology: Date Acquired: 01/03/2023 Wound Open Weeks Of Treatment: 1 Status: Clustered Wound: No Comorbid Cataracts, Arrhythmia, Congestive Heart Failure, Hypertension, History: Type II Diabetes, End Stage Renal Disease, Neuropathy Photos Wound Measurements Length: (cm) Width: (cm) Depth: (cm) Area: (cm) Volume: (cm) 0 % Reduction in Area: 100% 0 % Reduction in Volume: 100% 0 Epithelialization: Large (67-100%) 0 Tunneling: No 0 Undermining: No Wound Description Classification: Unable to visualize wound bed Wound Margin: Distinct, outline attached Exudate Amount: None Present Foul Odor After Cleansing: No Slough/Fibrino No Wound Bed Granulation Amount: None Present (0%) Exposed Structure Necrotic Amount: None Present (0%) Fascia Exposed: No Fat Layer (Subcutaneous Tissue) Exposed: No Tendon Exposed: No Muscle Exposed: No Joint Exposed: No Bone Exposed: No Periwound Skin Texture Texture Color No Abnormalities Noted: No No Abnormalities Noted: No Callus: No Atrophie Blanche: No Crepitus: No Cyanosis: No Excoriation: No Ecchymosis: No Induration: No Erythema: No Rash: No Hemosiderin Staining: Yes Scarring: No Mottled: No Pallor: No Moisture Rubor: No No Abnormalities Noted: No Dry / Scaly: Yes Maceration: No Electronic Signature(s) Signed: 02/14/2023 5:13:31 PM By: Shawn Stall RN, BSN Entered By: Shawn Stall on 02/14/2023 05:36:45 [name] ([id number]) [id number] Page 14 of  18 -------------------------------------------------------------------------------- Wound Assessment Details Patient Name: Date of Service: EMELIE, [name] 02/14/2023 8:00 A M Medical Record Number: [id number] Patient Account Number: [id number] Date of Birth/Sex: Treating RN: [birth year] (82 y.o. [name], Millard.Loa Primary Care Marty Sadlowski: Norva Riffle Other Clinician: Referring Davian Wollenberg: Treating Kaj Vasil/Extender: Theodosia Paling in Treatment: 1 Wound Status Wound Number: 13 Primary Diabetic Wound/Ulcer of the Lower Extremity Etiology: Wound Location: Left, Distal, Anterior Lower Leg Secondary Lymphedema Wounding Event: Blister Etiology: Date Acquired: 01/03/2023 Wound Open Weeks Of Treatment: 1 Status: Clustered Wound: No Comorbid Cataracts, Arrhythmia, Congestive Heart Failure, Hypertension, History: Type II Diabetes, End Stage Renal Disease, Neuropathy Photos Wound Measurements Length: (cm) 5 Width: (cm) 1 Depth: (cm) 0 Area: (cm) Volume: (cm) % Reduction in Area: 22.2% .4 % Reduction in Volume: 22.2% .1 Epithelialization: Large (67-100%) 5.498 Tunneling: No 0.55 Undermining: No Wound Description Classification: Unable to visualize wound bed Wound Margin: Distinct, outline attached Exudate Amount: Small Exudate Type: Serosanguineous Exudate Color: red, brown Foul Odor After Cleansing: No Slough/Fibrino Yes Wound Bed Granulation Amount: Large (67-100%) Exposed Structure Granulation Quality: Red, Pink Fascia Exposed: No Necrotic Amount: Small (1-33%) Fat Layer (Subcutaneous Tissue) Exposed: Yes Necrotic Quality: Eschar Tendon Exposed: No Muscle Exposed: No Joint Exposed: No Bone Exposed: No Periwound Skin Texture Texture Color No Abnormalities Noted: No No Abnormalities Noted: No Callus: No Atrophie Blanche: No Crepitus: No Cyanosis: No Excoriation: No Ecchymosis: No Induration: No Erythema: No Rash: No Hemosiderin Staining:  Yes Scarring: No Mottled: No Pallor: No Moisture Rubor: No No Abnormalities Noted: No Dry / Scaly: Yes Maceration: No Treatment Notes  Care Physician: Norva Riffle Other Clinician: Referring Physician: Treating Physician/Extender: Theodosia Paling in Treatment: 1 Education Assessment Education Provided To: Patient Education Topics Provided Wound/Skin Impairment: Handouts: Caring for Your Ulcer Methods: Explain/Verbal Responses: Reinforcements needed Electronic Signature(s) Signed: 02/14/2023 5:13:31 PM By: Shawn Stall RN, BSN Entered By: Shawn Stall on 02/14/2023 05:39:02 -------------------------------------------------------------------------------- Wound Assessment Details Patient Name: Date of Service: [name], [name] 02/14/2023 8:00 A M Medical Record Number: [id number] Patient Account Number: [id number] Date of Birth/Sex: Treating RN: [birth year] (82 y.o. [name], [name] Primary Care Mylo Choi: Norva Riffle Other Clinician: Referring Gavon Majano: Treating Goran Olden/Extender: Theodosia Paling in Treatment: 1 Wound Status Wound Number: 10 Primary Diabetic Wound/Ulcer of the Lower Extremity Etiology: Wound Location: Right, Medial Lower Leg Secondary Lymphedema Wounding Event: Blister Etiology: Date Acquired: 01/03/2023 Wound Open Weeks Of Treatment: 1 Status: Clustered Wound: No Comorbid Cataracts, Arrhythmia,  Congestive Heart Failure, Hypertension, History: Type II Diabetes, End Stage Renal Disease, Neuropathy Photos JHANIYA, BRISKI ([id number]) [id number] Page 11 of 18 Wound Measurements Length: (cm) Width: (cm) Depth: (cm) Area: (cm) Volume: (cm) 0 % Reduction in Area: 100% 0 % Reduction in Volume: 100% 0 Epithelialization: Large (67-100%) 0 Tunneling: No 0 Undermining: No Wound Description Classification: Unable to visualize wound bed Wound Margin: Distinct, outline attached Exudate Amount: None Present Foul Odor After Cleansing: No Slough/Fibrino No Wound Bed Granulation Amount: None Present (0%) Exposed Structure Necrotic Amount: None Present (0%) Fascia Exposed: No Fat Layer (Subcutaneous Tissue) Exposed: No Tendon Exposed: No Muscle Exposed: No Joint Exposed: No Bone Exposed: No Periwound Skin Texture Texture Color No Abnormalities Noted: No No Abnormalities Noted: No Callus: No Atrophie Blanche: No Crepitus: No Cyanosis: No Excoriation: No Ecchymosis: No Induration: No Erythema: No Rash: No Hemosiderin Staining: Yes Scarring: No Mottled: No Pallor: No Moisture Rubor: No No Abnormalities Noted: No Dry / Scaly: Yes Temperature / Pain Maceration: No Temperature: No Abnormality Electronic Signature(s) Signed: 02/14/2023 5:13:31 PM By: Shawn Stall RN, BSN Entered By: Shawn Stall on 02/14/2023 05:35:18 -------------------------------------------------------------------------------- Wound Assessment Details Patient Name: Date of Service: [name], [name] 02/14/2023 8:00 A M Medical Record Number: [id number] Patient Account Number: [id number] Date of Birth/Sex: Treating RN: 1940-11-28 (82 y.o. [name] Primary Care Ramy Greth: Norva Riffle Other Clinician: Referring Orla Jolliff: Treating Johnatha Zeidman/Extender: Theodosia Paling in Treatment: 1 Wound Status Wound Number: 11 Primary Diabetic Wound/Ulcer  of the Lower Extremity Etiology: Wound Location: Left, Lateral Lower Leg Secondary Lymphedema Wounding Event: Blister Etiology: DEVONA, HOLMES ([id number]) [id number] Page 12 of 18 Etiology: Date Acquired: 01/03/2023 Wound Open Weeks Of Treatment: 1 Status: Clustered Wound: No Comorbid Cataracts, Arrhythmia, Congestive Heart Failure, Hypertension, History: Type II Diabetes, End Stage Renal Disease, Neuropathy Photos Wound Measurements Length: (cm) Width: (cm) Depth: (cm) Area: (cm) Volume: (cm) 0 % Reduction in Area: 100% 0 % Reduction in Volume: 100% 0 Epithelialization: Large (67-100%) 0 Tunneling: No 0 Undermining: No Wound Description Classification: Unable to visualize wound bed Wound Margin: Distinct, outline attached Exudate Amount: None Present Foul Odor After Cleansing: No Slough/Fibrino No Wound Bed Granulation Amount: None Present (0%) Exposed Structure Necrotic Amount: None Present (0%) Fascia Exposed: No Fat Layer (Subcutaneous Tissue) Exposed: No Tendon Exposed: No Muscle Exposed: No Joint Exposed: No Bone Exposed: No Periwound Skin Texture Texture Color No Abnormalities Noted: No No Abnormalities Noted: No Callus: No Atrophie Blanche: No Crepitus: No Cyanosis: No Excoriation: No Ecchymosis: No Induration: No Erythema: No Rash: No Hemosiderin Staining: Yes Scarring: No Mottled: No Pallor:

## 2023-02-15 NOTE — Progress Notes (Signed)
both legs. She does not complain of pain. She may have been given a course of doxycycline. 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 She lives with the daughter ABI in our clinic was 0.77 on the right 0.38 monophasic on the left. The patient had arterial studies on 02/26/2022 at which time both ABIs were noncompressible TBI in the right was 0.79 on the left 0.43. Waveforms on the right were multiphasic on the left monophasic. 10/14; patient I readmitted to the clinic last week. She has wounds on her bilateral lower legs. The areas on the right are healed today she has 2 remaining open areas on the left. We used TCA Hydrofera Blue and kerlix Coban. I also think the patient probably has significant PAD. She is not all that ambulatory I was not able to elicit any claudication although according to her daughter she sleeps with her legs dangling over the side of the bed. ABIs in our clinic were 0.77 on the right 0.38 and monophasic on the left the patient had arterial studies about a year ago again with noncompressible ABIs on the left and a TBI of 0.43 on the left. Electronic Signature(s) Signed: 02/14/2023 4:41:28 PM By: Baltazar Najjar MD Entered By: Baltazar Najjar on 02/14/2023 06:04:09 [name] ([id number]) [id number] Page 2 of 7 -------------------------------------------------------------------------------- Physical Exam Details Patient Name: Date of Service: MARILOUISE, DENSMORE NN 02/14/2023 8:00 A M Medical Record Number: [id number] Patient Account Number: [id number] Date of Birth/Sex: Treating RN: Sep 16, 1940 (82 y.o. F) Primary Care Provider: Norva Riffle Other Clinician: Referring Provider: Treating Provider/Extender: Theodosia Paling in Treatment: 1 Constitutional Sitting or standing Blood Pressure is within target range for patient.. Pulse regular and within target range for patient.Marland Kitchen Respirations regular, non-labored and within  target range.. Temperature is normal and within the target range for the patient.Marland Kitchen Appears in no distress. Notes Wound exam; minimal to no edema. Her skin looks a lot better in terms of the xerosis. She has 2 open areas on the left that remain everything is healed on the right. There is no further new blisters. Her pulses are palpable on the right but very anemic I could not feel anything on the left. Popliteal pulses are not palpable Electronic Signature(s) Signed: 02/14/2023 4:41:28 PM By: Baltazar Najjar MD Entered By: Baltazar Najjar on 02/14/2023 06:05:17 -------------------------------------------------------------------------------- Physician Orders Details Patient Name: Date of Service: LATALIA, ETZLER NN 02/14/2023 8:00 A M Medical Record Number: [id number] Patient Account Number: [id number] Date of Birth/Sex: Treating RN: 11/03/1940 (82 y.o. 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 - Dr. Leanord Hawking 2pm Monday room 8 02/21/2023 Return A ****BRING IN COMPRESSION STOCKINGS****** ppointment in 2 weeks. - 02/28/2023 1100 Dr. Leanord Hawking Return A Other: - [facility] call [facility]  both legs. She does not complain of pain. She may have been given a course of doxycycline. 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 She lives with the daughter ABI in our clinic was 0.77 on the right 0.38 monophasic on the left. The patient had arterial studies on 02/26/2022 at which time both ABIs were noncompressible TBI in the right was 0.79 on the left 0.43. Waveforms on the right were multiphasic on the left monophasic. 10/14; patient I readmitted to the clinic last week. She has wounds on her bilateral lower legs. The areas on the right are healed today she has 2 remaining open areas on the left. We used TCA Hydrofera Blue and kerlix Coban. I also think the patient probably has significant PAD. She is not all that ambulatory I was not able to elicit any claudication although according to her daughter she sleeps with her legs dangling over the side of the bed. ABIs in our clinic were 0.77 on the right 0.38 and monophasic on the left the patient had arterial studies about a year ago again with noncompressible ABIs on the left and a TBI of 0.43 on the left. Electronic Signature(s) Signed: 02/14/2023 4:41:28 PM By: Baltazar Najjar MD Entered By: Baltazar Najjar on 02/14/2023 06:04:09 [name] ([id number]) [id number] Page 2 of 7 -------------------------------------------------------------------------------- Physical Exam Details Patient Name: Date of Service: MARILOUISE, DENSMORE NN 02/14/2023 8:00 A M Medical Record Number: [id number] Patient Account Number: [id number] Date of Birth/Sex: Treating RN: Sep 16, 1940 (82 y.o. F) Primary Care Provider: Norva Riffle Other Clinician: Referring Provider: Treating Provider/Extender: Theodosia Paling in Treatment: 1 Constitutional Sitting or standing Blood Pressure is within target range for patient.. Pulse regular and within target range for patient.Marland Kitchen Respirations regular, non-labored and within  target range.. Temperature is normal and within the target range for the patient.Marland Kitchen Appears in no distress. Notes Wound exam; minimal to no edema. Her skin looks a lot better in terms of the xerosis. She has 2 open areas on the left that remain everything is healed on the right. There is no further new blisters. Her pulses are palpable on the right but very anemic I could not feel anything on the left. Popliteal pulses are not palpable Electronic Signature(s) Signed: 02/14/2023 4:41:28 PM By: Baltazar Najjar MD Entered By: Baltazar Najjar on 02/14/2023 06:05:17 -------------------------------------------------------------------------------- Physician Orders Details Patient Name: Date of Service: LATALIA, ETZLER NN 02/14/2023 8:00 A M Medical Record Number: [id number] Patient Account Number: [id number] Date of Birth/Sex: Treating RN: 11/03/1940 (82 y.o. 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 - Dr. Leanord Hawking 2pm Monday room 8 02/21/2023 Return A ****BRING IN COMPRESSION STOCKINGS****** ppointment in 2 weeks. - 02/28/2023 1100 Dr. Leanord Hawking Return A Other: - [facility] call you  7 -------------------------------------------------------------------------------- Problem List Details Patient Name: Date of Service: ATHIRA, JANOWICZ NN 02/14/2023 8:00 A M Medical Record Number: [id number] Patient Account Number: [id number] Date of Birth/Sex: Treating RN: 1940/12/21 (82 y.o. [name], Millard.Loa Primary Care Provider: Norva Riffle Other Clinician: Referring Provider: Treating Provider/Extender: Theodosia Paling in Treatment: 1 Active Problems ICD-10 Encounter Code Description Active Date MDM Diagnosis I87.333 Chronic venous hypertension (idiopathic) with ulcer and inflammation of 02/07/2023 No Yes bilateral lower extremity L97.828 Non-pressure chronic ulcer of other part of left lower leg with other specified 02/07/2023 No Yes severity L97.818 Non-pressure chronic ulcer of other part of right lower leg with other specified 02/07/2023 No Yes severity E11.51 Type 2 diabetes mellitus with diabetic peripheral angiopathy without gangrene 02/07/2023 No Yes 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 F) Primary Care Provider: Norva Riffle Other Clinician: Referring Provider: Treating Provider/Extender: Theodosia Paling in Treatment: 1 Subjective History of Present Illness (HPI) ADMISSION 12/25/2021 This is an 82 year old woman with multiple medical comorbidities including congestive heart  failure, hypertension, end-stage renal disease on hemodialysis, polyneuropathy, poorly controlled type 2 diabetes mellitus (last hemoglobin A1c available to me is from December 2022 and was 9.6). She presents to clinic today with multiple open ulcers on her bilateral lower extremities in the setting of severe edema. She says that she has compression stockings but has not worn them in some time because they are too tight. Her wounds are secondary to trauma from her wheelchair. She often sleeps in her wheelchair with her legs hanging down. ABIs in clinic today were noncompressible on the right and 0.57 on the left. She has multiple shallow ulcers on the bilateral lower extremities. She has 2+ pitting edema and her legs are extremely tense. They are somewhat KASHAY, CAVENAUGH ([id number]) [id number] Page 5 of 7 erythematous and warm. She is currently taking cephalexin prescribed by her primary care provider due to concern for cellulitis. 01/01/2022: All of her wounds are healed. Edema control with the Kerlix and Coban wraps is excellent. She does have compression stockings at home. READMISSION 05/07/2022 The patient returns with a new wound on her right lower anterior leg. She is accompanied by her daughter. They admit that she has not been wearing her compression stockings at home. The patient's daughter reports that this wound started as a blister that ruptured. She has been applying peroxide. Both indicate that the patient is still frequently sleeping in her wheelchair with her legs dependent. ABI in clinic today was 1.08. The wound is small, just medial to the anterior tibial surface, clean. 06/07/2022: The patient has failed to show up for multiple clinic visits. The patient's daughter says that she simply refuses to come when its time for her appointments. She continues to be quite stubborn about sleeping in her wheelchair with her legs dependent. The wound on her right anterior  tibial surface is shallow and just has a bit of eschar buildup. 06/14/2022: She has a new wound on her left anterior tibial surface. It has some slough accumulation. The wound on her right leg has slough and eschar buildup. Edema control is much better this week. 06/21/2022: The wound  both legs. She does not complain of pain. She may have been given a course of doxycycline. 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 She lives with the daughter ABI in our clinic was 0.77 on the right 0.38 monophasic on the left. The patient had arterial studies on 02/26/2022 at which time both ABIs were noncompressible TBI in the right was 0.79 on the left 0.43. Waveforms on the right were multiphasic on the left monophasic. 10/14; patient I readmitted to the clinic last week. She has wounds on her bilateral lower legs. The areas on the right are healed today she has 2 remaining open areas on the left. We used TCA Hydrofera Blue and kerlix Coban. I also think the patient probably has significant PAD. She is not all that ambulatory I was not able to elicit any claudication although according to her daughter she sleeps with her legs dangling over the side of the bed. ABIs in our clinic were 0.77 on the right 0.38 and monophasic on the left the patient had arterial studies about a year ago again with noncompressible ABIs on the left and a TBI of 0.43 on the left. Electronic Signature(s) Signed: 02/14/2023 4:41:28 PM By: Baltazar Najjar MD Entered By: Baltazar Najjar on 02/14/2023 06:04:09 [name] ([id number]) [id number] Page 2 of 7 -------------------------------------------------------------------------------- Physical Exam Details Patient Name: Date of Service: MARILOUISE, DENSMORE NN 02/14/2023 8:00 A M Medical Record Number: [id number] Patient Account Number: [id number] Date of Birth/Sex: Treating RN: Sep 16, 1940 (82 y.o. F) Primary Care Provider: Norva Riffle Other Clinician: Referring Provider: Treating Provider/Extender: Theodosia Paling in Treatment: 1 Constitutional Sitting or standing Blood Pressure is within target range for patient.. Pulse regular and within target range for patient.Marland Kitchen Respirations regular, non-labored and within  target range.. Temperature is normal and within the target range for the patient.Marland Kitchen Appears in no distress. Notes Wound exam; minimal to no edema. Her skin looks a lot better in terms of the xerosis. She has 2 open areas on the left that remain everything is healed on the right. There is no further new blisters. Her pulses are palpable on the right but very anemic I could not feel anything on the left. Popliteal pulses are not palpable Electronic Signature(s) Signed: 02/14/2023 4:41:28 PM By: Baltazar Najjar MD Entered By: Baltazar Najjar on 02/14/2023 06:05:17 -------------------------------------------------------------------------------- Physician Orders Details Patient Name: Date of Service: LATALIA, ETZLER NN 02/14/2023 8:00 A M Medical Record Number: [id number] Patient Account Number: [id number] Date of Birth/Sex: Treating RN: 11/03/1940 (82 y.o. 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 - Dr. Leanord Hawking 2pm Monday room 8 02/21/2023 Return A ****BRING IN COMPRESSION STOCKINGS****** ppointment in 2 weeks. - 02/28/2023 1100 Dr. Leanord Hawking Return A Other: - [facility] will call you  XIANNA, SIVERLING ([id number]) [id number] Page 1 of 7 Visit Report for 02/14/2023 HPI Details Patient Name: Date of Service: REYAH, STREETER NN 02/14/2023 8:00 A M Medical Record Number: [id number] Patient Account Number: [id number] Date of Birth/Sex: Treating RN: 1941-03-27 (82 y.o. F) Primary Care Provider: Norva Riffle Other Clinician: Referring Provider: Treating Provider/Extender: Theodosia Paling in Treatment: 1 History of Present Illness HPI Description: ADMISSION 12/25/2021 This is an 82 year old woman with multiple medical comorbidities including congestive heart failure, hypertension, end-stage renal disease on hemodialysis, polyneuropathy, poorly controlled type 2 diabetes mellitus (last hemoglobin A1c available to me is from December 2022 and was 9.6). She presents to clinic today with multiple open ulcers on her bilateral lower extremities in the setting of severe edema. She says that she has compression stockings but has not worn them in some time because they are too tight. Her wounds are secondary to trauma from her wheelchair. She often sleeps in her wheelchair with her legs hanging down. ABIs in clinic today were noncompressible on the right and 0.57 on the left. She has multiple shallow ulcers on the bilateral lower extremities. She has 2+ pitting edema and her legs are extremely tense. They are somewhat erythematous and warm. She is currently taking cephalexin prescribed by her primary care provider due to concern for cellulitis. 01/01/2022: All of her wounds are healed. Edema control with the Kerlix and Coban wraps is excellent. She does have compression stockings at home. READMISSION 05/07/2022 The patient returns with a new wound on her right lower anterior leg. She is accompanied by her daughter. They admit that she has not been wearing her compression stockings at home. The patient's daughter reports that this wound started  as a blister that ruptured. She has been applying peroxide. Both indicate that the patient is still frequently sleeping in her wheelchair with her legs dependent. ABI in clinic today was 1.08. The wound is small, just medial to the anterior tibial surface, clean. 06/07/2022: The patient has failed to show up for multiple clinic visits. The patient's daughter says that she simply refuses to come when its time for her appointments. She continues to be quite stubborn about sleeping in her wheelchair with her legs dependent. The wound on her right anterior tibial surface is shallow and just has a bit of eschar buildup. 06/14/2022: She has a new wound on her left anterior tibial surface. It has some slough accumulation. The wound on her right leg has slough and eschar buildup. Edema control is much better this week. 06/21/2022: The wound on her right leg is healed. The wound on her left anterior tibial surface is superficial with just a layer of eschar on the surface. Edema control is very good bilaterally. 06/30/2022: Her left leg has healed. She managed to scrape her anterior tibial surface on the right and she has open abrasions extending into the fat layer. 07/09/2022: The right anterior tibial surface wounds have eschar and adherent silver alginate present. No concern for infection. ADMISSION 02/07/2023 This is a now 82 year old woman who arrives in clinic on her own. She has had previous visits to this clinic in 2023 and more recently earlier this year at which time she had wounds in the right leg with chronic venous insufficiency which she was discharged with stockings. Notably low ABI. 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 The patient has apparently had blisters on her legs for 1 month and they eventually opened up into wounds this is on  7 -------------------------------------------------------------------------------- Problem List Details Patient Name: Date of Service: ATHIRA, JANOWICZ NN 02/14/2023 8:00 A M Medical Record Number: [id number] Patient Account Number: [id number] Date of Birth/Sex: Treating RN: 1940/12/21 (82 y.o. [name], Millard.Loa Primary Care Provider: Norva Riffle Other Clinician: Referring Provider: Treating Provider/Extender: Theodosia Paling in Treatment: 1 Active Problems ICD-10 Encounter Code Description Active Date MDM Diagnosis I87.333 Chronic venous hypertension (idiopathic) with ulcer and inflammation of 02/07/2023 No Yes bilateral lower extremity L97.828 Non-pressure chronic ulcer of other part of left lower leg with other specified 02/07/2023 No Yes severity L97.818 Non-pressure chronic ulcer of other part of right lower leg with other specified 02/07/2023 No Yes severity E11.51 Type 2 diabetes mellitus with diabetic peripheral angiopathy without gangrene 02/07/2023 No Yes 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 F) Primary Care Provider: Norva Riffle Other Clinician: Referring Provider: Treating Provider/Extender: Theodosia Paling in Treatment: 1 Subjective History of Present Illness (HPI) ADMISSION 12/25/2021 This is an 82 year old woman with multiple medical comorbidities including congestive heart  failure, hypertension, end-stage renal disease on hemodialysis, polyneuropathy, poorly controlled type 2 diabetes mellitus (last hemoglobin A1c available to me is from December 2022 and was 9.6). She presents to clinic today with multiple open ulcers on her bilateral lower extremities in the setting of severe edema. She says that she has compression stockings but has not worn them in some time because they are too tight. Her wounds are secondary to trauma from her wheelchair. She often sleeps in her wheelchair with her legs hanging down. ABIs in clinic today were noncompressible on the right and 0.57 on the left. She has multiple shallow ulcers on the bilateral lower extremities. She has 2+ pitting edema and her legs are extremely tense. They are somewhat KASHAY, CAVENAUGH ([id number]) [id number] Page 5 of 7 erythematous and warm. She is currently taking cephalexin prescribed by her primary care provider due to concern for cellulitis. 01/01/2022: All of her wounds are healed. Edema control with the Kerlix and Coban wraps is excellent. She does have compression stockings at home. READMISSION 05/07/2022 The patient returns with a new wound on her right lower anterior leg. She is accompanied by her daughter. They admit that she has not been wearing her compression stockings at home. The patient's daughter reports that this wound started as a blister that ruptured. She has been applying peroxide. Both indicate that the patient is still frequently sleeping in her wheelchair with her legs dependent. ABI in clinic today was 1.08. The wound is small, just medial to the anterior tibial surface, clean. 06/07/2022: The patient has failed to show up for multiple clinic visits. The patient's daughter says that she simply refuses to come when its time for her appointments. She continues to be quite stubborn about sleeping in her wheelchair with her legs dependent. The wound on her right anterior  tibial surface is shallow and just has a bit of eschar buildup. 06/14/2022: She has a new wound on her left anterior tibial surface. It has some slough accumulation. The wound on her right leg has slough and eschar buildup. Edema control is much better this week. 06/21/2022: The wound  May shower and wash wound with dial antibacterial soap and water prior to dressing change. Cleanser: Vashe 5.8 (oz) 1 x Per Week/30 Days Discharge Instructions: Cleanse the wound with Vashe prior to applying a clean dressing using gauze sponges, not tissue or cotton balls. Peri-Wound Care: Triamcinolone 15 (g) 1 x Per Week/30 Days Discharge Instructions: Use triamcinolone 15 (g) as directed Peri-Wound Care: Sween Lotion (Moisturizing lotion) 1 x Per Week/30 Days Discharge Instructions: Apply moisturizing lotion as directed BAYYINAH, DUKEMAN ([id number]) [id number] Page 7 of 7 Prim Dressing: Hydrofera Blue Ready Transfer Foam, 2.5x2.5 (in/in) 1 x Per Week/30 Days ary Discharge Instructions: Apply directly to wound bed as directed Secondary Dressing: ABD Pad, 8x10 1 x Per Week/30 Days Discharge Instructions: Apply over primary dressing as directed. Com pression Wrap: Kerlix Roll 4.5x3.1 (in/yd) 1 x Per Week/30 Days Discharge Instructions: Apply Kerlix and Coban compression as directed. Com pression Wrap: Coban Self-Adherent Wrap 4x5 (in/yd) 1 x Per Week/30 Days Discharge Instructions: Apply over Kerlix as directed. 1. Continue with Hydrofera Blue was the main dressing under kerlix Coban wrap on the left 2. We put her in Tubigrip today, apparently she has her own compression stockings to use 3. I explained the situation to the patient and her daughter who is present today. I think she has both venous and arterial disease. It is really the arterial disease that is most concerning. I cannot detect that she has any claudication although she is a very minimal ambulator. We referred her to vascular surgery to see if they think any other testing is necessary. As mentioned she had studies done in October 2023 Electronic Signature(s) Signed: 02/14/2023 4:41:28 PM By: Baltazar Najjar MD Entered By: Baltazar Najjar on 02/14/2023  06:07:01 -------------------------------------------------------------------------------- SuperBill Details Patient Name: Date of Service: YANIYAH, KOORS NN 02/14/2023 Medical Record Number: [id number] Patient Account Number: [id number] Date of Birth/Sex: Treating RN: 05-02-1941 (82 y.o. [name], Millard.Loa Primary Care Provider: Norva Riffle Other Clinician: Referring Provider: Treating Provider/Extender: Theodosia Paling in Treatment: 1 Diagnosis Coding ICD-10 Codes Code Description [phone] Chronic venous hypertension (idiopathic) with ulcer and inflammation of bilateral lower extremity L97.828 Non-pressure chronic ulcer of other part of left lower leg with other specified severity L97.818 Non-pressure chronic ulcer of other part of right lower leg with other specified severity E11.51 Type 2 diabetes mellitus with diabetic peripheral angiopathy 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 Facility Procedures : CPT4 Code: [id number] Description: 84696 - WOUND CARE VISIT-LEV 5 EST PT Modifier: Quantity: 1 Physician Procedures : CPT4 Code Description Modifier [id number] - WC PHYS LEVEL 3 - EST PT ICD-10 Diagnosis Description L97.828 Non-pressure chronic ulcer of other part of left lower leg with other specified severity L97.818 Non-pressure chronic ulcer of other part of  right lower leg with other specified severity 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 E11.51 Type 2 diabetes mellitus with diabetic peripheral angiopathy without gangrene Quantity: 1 Electronic Signature(s) Signed: 02/14/2023 4:41:28 PM By: Baltazar Najjar MD Entered By: Baltazar Najjar on 02/14/2023 06:07:45  7 -------------------------------------------------------------------------------- Problem List Details Patient Name: Date of Service: ATHIRA, JANOWICZ NN 02/14/2023 8:00 A M Medical Record Number: [id number] Patient Account Number: [id number] Date of Birth/Sex: Treating RN: 1940/12/21 (82 y.o. [name], Millard.Loa Primary Care Provider: Norva Riffle Other Clinician: Referring Provider: Treating Provider/Extender: Theodosia Paling in Treatment: 1 Active Problems ICD-10 Encounter Code Description Active Date MDM Diagnosis I87.333 Chronic venous hypertension (idiopathic) with ulcer and inflammation of 02/07/2023 No Yes bilateral lower extremity L97.828 Non-pressure chronic ulcer of other part of left lower leg with other specified 02/07/2023 No Yes severity L97.818 Non-pressure chronic ulcer of other part of right lower leg with other specified 02/07/2023 No Yes severity E11.51 Type 2 diabetes mellitus with diabetic peripheral angiopathy without gangrene 02/07/2023 No Yes 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 F) Primary Care Provider: Norva Riffle Other Clinician: Referring Provider: Treating Provider/Extender: Theodosia Paling in Treatment: 1 Subjective History of Present Illness (HPI) ADMISSION 12/25/2021 This is an 82 year old woman with multiple medical comorbidities including congestive heart  failure, hypertension, end-stage renal disease on hemodialysis, polyneuropathy, poorly controlled type 2 diabetes mellitus (last hemoglobin A1c available to me is from December 2022 and was 9.6). She presents to clinic today with multiple open ulcers on her bilateral lower extremities in the setting of severe edema. She says that she has compression stockings but has not worn them in some time because they are too tight. Her wounds are secondary to trauma from her wheelchair. She often sleeps in her wheelchair with her legs hanging down. ABIs in clinic today were noncompressible on the right and 0.57 on the left. She has multiple shallow ulcers on the bilateral lower extremities. She has 2+ pitting edema and her legs are extremely tense. They are somewhat KASHAY, CAVENAUGH ([id number]) [id number] Page 5 of 7 erythematous and warm. She is currently taking cephalexin prescribed by her primary care provider due to concern for cellulitis. 01/01/2022: All of her wounds are healed. Edema control with the Kerlix and Coban wraps is excellent. She does have compression stockings at home. READMISSION 05/07/2022 The patient returns with a new wound on her right lower anterior leg. She is accompanied by her daughter. They admit that she has not been wearing her compression stockings at home. The patient's daughter reports that this wound started as a blister that ruptured. She has been applying peroxide. Both indicate that the patient is still frequently sleeping in her wheelchair with her legs dependent. ABI in clinic today was 1.08. The wound is small, just medial to the anterior tibial surface, clean. 06/07/2022: The patient has failed to show up for multiple clinic visits. The patient's daughter says that she simply refuses to come when its time for her appointments. She continues to be quite stubborn about sleeping in her wheelchair with her legs dependent. The wound on her right anterior  tibial surface is shallow and just has a bit of eschar buildup. 06/14/2022: She has a new wound on her left anterior tibial surface. It has some slough accumulation. The wound on her right leg has slough and eschar buildup. Edema control is much better this week. 06/21/2022: The wound

## 2023-02-21 ENCOUNTER — Encounter (HOSPITAL_BASED_OUTPATIENT_CLINIC_OR_DEPARTMENT_OTHER): Payer: Medicare HMO | Admitting: Internal Medicine

## 2023-02-21 DIAGNOSIS — E11622 Type 2 diabetes mellitus with other skin ulcer: Secondary | ICD-10-CM | POA: Diagnosis not present

## 2023-02-25 ENCOUNTER — Ambulatory Visit (INDEPENDENT_AMBULATORY_CARE_PROVIDER_SITE_OTHER): Payer: Medicare HMO | Admitting: Physician Assistant

## 2023-02-25 ENCOUNTER — Telehealth: Payer: Self-pay

## 2023-02-25 VITALS — BP 94/59 | HR 64 | Temp 98.2°F | Ht 64.0 in | Wt 140.0 lb

## 2023-02-25 DIAGNOSIS — N186 End stage renal disease: Secondary | ICD-10-CM

## 2023-02-25 NOTE — Progress Notes (Signed)
[name], [name] ([id number]) [id number] Page 1 of 6 Visit Report for 02/21/2023 HPI Details Patient Name: Date of Service: [name], [name] 02/21/2023 2:00 PM Medical Record Number: [id number] Patient Account Number: [id number] Date of Birth/Sex: Treating RN: 23-Mar-1941 (82 y.o. F) Primary Care Provider: Norva Riffle Other Clinician: Referring Provider: Treating Provider/Extender: Theodosia Paling in Treatment: 2 History of Present Illness HPI Description: ADMISSION 12/25/2021 This is an 82 year old woman with multiple medical comorbidities including congestive heart failure, hypertension, end-stage renal disease on hemodialysis, polyneuropathy, poorly controlled type 2 diabetes mellitus (last hemoglobin A1c available to me is from December 2022 and was 9.6). She presents to clinic today with multiple open ulcers on her bilateral lower extremities in the setting of severe edema. She says that she has compression stockings but has not worn them in some time because they are too tight. Her wounds are secondary to trauma from her wheelchair. She often sleeps in her wheelchair with her legs hanging down. ABIs in clinic today were noncompressible on the right and 0.57 on the left. She has multiple shallow ulcers on the bilateral lower extremities. She has 2+ pitting edema and her legs are extremely tense. They are somewhat erythematous and warm. She is currently taking cephalexin prescribed by her primary care provider due to concern for cellulitis. 01/01/2022: All of her wounds are healed. Edema control with the Kerlix and Coban wraps is excellent. She does have compression stockings at home. READMISSION 05/07/2022 The patient returns with a new wound on her right lower anterior leg. She is accompanied by her daughter. They admit that she has not been wearing her compression stockings at home. The patient's daughter reports that this wound started  as a blister that ruptured. She has been applying peroxide. Both indicate that the patient is still frequently sleeping in her wheelchair with her legs dependent. ABI in clinic today was 1.08. The wound is small, just medial to the anterior tibial surface, clean. 06/07/2022: The patient has failed to show up for multiple clinic visits. The patient's daughter says that she simply refuses to come when its time for her appointments. She continues to be quite stubborn about sleeping in her wheelchair with her legs dependent. The wound on her right anterior tibial surface is shallow and just has a bit of eschar buildup. 06/14/2022: She has a new wound on her left anterior tibial surface. It has some slough accumulation. The wound on her right leg has slough and eschar buildup. Edema control is much better this week. 06/21/2022: The wound on her right leg is healed. The wound on her left anterior tibial surface is superficial with just a layer of eschar on the surface. Edema control is very good bilaterally. 06/30/2022: Her left leg has healed. She managed to scrape her anterior tibial surface on the right and she has open abrasions extending into the fat layer. 07/09/2022: The right anterior tibial surface wounds have eschar and adherent silver alginate present. No concern for infection. ADMISSION 02/07/2023 This is a now 82 year old woman who arrives in clinic on her own. She has had previous visits to this clinic in 2023 and more recently earlier this year at which time she had wounds in the right leg with chronic venous insufficiency which she was discharged with stockings. Notably low ABI. 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 The patient has apparently had blisters on her legs for 1 month and they eventually opened up into wounds this is on both  minutes daily and/or when sitting for 3-4 times a day throughout the day. Avoid standing for long periods of time. Other Edema Control  Orders/Instructions: - will apply single layer tubigrip D in clinic. May wear compression stocking at home to right leg. Remove the compression tubigrip before applying your compression stocking. If compression wraps slide down please call wound center and speak with a nurse. Wound Treatment Wound #15 - Lower Leg Wound Laterality: Right, Anterior Cleanser: Soap and Water 1 x Per Week/30 Days Discharge Instructions: May shower and wash wound with dial antibacterial soap and water prior to dressing change. Cleanser: Vashe 5.8 (oz) 1 x Per Week/30 Days Discharge Instructions: Cleanse the wound with Vashe prior to applying a clean dressing using gauze sponges, not tissue or cotton balls. Peri-Wound Care: Triamcinolone 15 (g) 1 x Per Week/30 Days Discharge Instructions: Use triamcinolone 15 (g) as directed [name] ([id number]) [phone].pdf Page 3 of 6 Peri-Wound Care: Sween Lotion (Moisturizing lotion) 1 x Per Week/30 Days Discharge Instructions: Apply moisturizing lotion as directed Prim Dressing: Hydrofera Blue Ready Transfer Foam, 2.5x2.5 (in/in) 1 x Per Week/30 Days ary Discharge Instructions: Apply directly to wound bed as directed Secondary Dressing: ABD Pad, 8x10 1 x Per Week/30 Days Discharge Instructions: Apply over primary dressing as directed. Compression Wrap: Kerlix Roll 4.5x3.1 (in/yd) 1 x Per Week/30 Days Discharge Instructions: Apply Kerlix and Coban compression as directed. Compression Wrap: Coban Self-Adherent Wrap 4x5 (in/yd) 1 x Per Week/30 Days Discharge Instructions: Apply over Kerlix as directed. Electronic Signature(s) Signed: 02/21/2023 6:11:24 PM By: Baltazar Najjar MD Signed: 02/24/2023 5:52:17 PM By: Redmond Pulling RN, BSN Entered By: Redmond Pulling on 02/21/2023 11:44:15 -------------------------------------------------------------------------------- Problem List Details Patient Name: Date of Service: [name], [name] 02/21/2023  2:00 PM Medical Record Number: [id number] Patient Account Number: [id number] Date of Birth/Sex: Treating RN: Sep 30, 1940 (82 y.o. 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 of 02/07/2023 No Yes bilateral lower extremity L97.828 Non-pressure chronic ulcer of other part of left lower leg with other specified 02/07/2023 No Yes severity L97.818 Non-pressure chronic ulcer of other part of right lower leg with other specified 02/07/2023 No Yes severity E11.51 Type 2 diabetes mellitus with diabetic peripheral angiopathy without gangrene 02/07/2023 No Yes 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 F) Primary Care Provider: Norva Riffle Other Clinician: Referring Provider: Treating Provider/Extender: Theodosia Paling in Treatment: 2 Subjective History of Present Illness (HPI) ADMISSION 12/25/2021 This is an 82 year old woman with multiple medical comorbidities including congestive heart failure, hypertension, end-stage renal disease on hemodialysis, polyneuropathy, poorly controlled type 2 diabetes mellitus (last hemoglobin A1c available to  me is from December 2022 and was 9.6). She presents to clinic today with multiple open ulcers on her bilateral lower extremities in the setting of severe edema. She says that she has compression stockings but has not worn them in some time because they are too tight. 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 - Dr. Leanord Hawking 2pm Monday room 8 02/28/2023 1100 ****BRING IN COMPRESSION STOCKINGS****** Return Appointment in 2 weeks. - Dr. Leanord Hawking Other: - Vein and Vascular appt 12/11 Bathing/ Shower/ Hygiene: May shower with protection but do not get wound dressing(s) wet. Protect dressing(s) with water repellant cover (for example, large plastic bag) or a cast cover and may then take shower. Edema Control - Lymphedema / SCD / Other: Elevate legs to the level of the heart or above for 30 minutes daily and/or when sitting for 3-4 times a day throughout the day. Avoid standing for long periods of time. Other Edema Control Orders/Instructions: - will apply single layer tubigrip D in clinic. May wear compression stocking at home to right leg. Remove the compression tubigrip before applying your compression stocking. If compression wraps slide down please call wound center and speak with a nurse. WOUND #15: - Lower Leg Wound Laterality: Right, Anterior Cleanser: Soap and Water 1 x Per Week/30 Days Discharge Instructions: May shower and wash wound with dial antibacterial soap and water prior to dressing change. Cleanser: Vashe 5.8 (oz) 1 x Per Week/30 Days Discharge Instructions: Cleanse the wound with Vashe prior to applying a clean dressing using gauze sponges, not tissue or cotton balls. Peri-Wound Care: Triamcinolone 15 (g) 1 x Per Week/30 Days Discharge Instructions: Use triamcinolone 15 (g) as directed Peri-Wound Care: Sween Lotion (Moisturizing lotion) 1 x Per Week/30 Days Discharge Instructions: Apply moisturizing lotion as directed Prim Dressing: Hydrofera Blue Ready Transfer Foam, 2.5x2.5 (in/in) 1 x Per Week/30 Days ary Discharge Instructions: Apply directly  to wound bed as directed Secondary Dressing: ABD Pad, 8x10 1 x Per Week/30 Days Discharge Instructions: Apply over primary dressing as directed. Com pression Wrap: Kerlix Roll 4.5x3.1 (in/yd) 1 x Per Week/30 Days Discharge Instructions: Apply Kerlix and Coban compression as directed. Com pression Wrap: Coban Self-Adherent Wrap 4x5 (in/yd) 1 x Per Week/30 Days Discharge Instructions: Apply over Kerlix as directed. [name], [name] ([id number]) [id number] Page 6 of 6 1. We dressed the area on the right anterior mid tibia with Hydrofera Blue kerlix Coban 2. Kerlix Coban on the left 3. New 20/30 below-knee stockings bilaterally. 4. Hopefully ready for discharge but next week Electronic Signature(s) Signed: 02/21/2023 6:11:24 PM By: Baltazar Najjar MD Entered By: Baltazar Najjar on 02/21/2023 11:51:04 -------------------------------------------------------------------------------- SuperBill Details Patient Name: Date of Service: [name], [name] 02/21/2023 Medical Record Number: [id number] Patient Account Number: [id number] Date of Birth/Sex: Treating RN: 05-26-1940 (82 y.o. 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 F) Primary Care Provider: Norva Riffle Other Clinician: Referring Provider: Treating Provider/Extender: Theodosia Paling in Treatment: 2 History of Present Illness HPI Description: ADMISSION 12/25/2021 This is an 82 year old woman with multiple medical comorbidities including congestive heart failure, hypertension, end-stage renal disease on hemodialysis, polyneuropathy, poorly controlled type 2 diabetes mellitus (last hemoglobin A1c available to me is from December 2022 and was 9.6). She presents to clinic today with multiple open ulcers on her bilateral lower extremities in the setting of severe edema. She says that she has compression stockings but has not worn them in some time because they are too tight. Her wounds are secondary to trauma from her wheelchair. She often sleeps in her wheelchair with her legs hanging down. ABIs in clinic today were noncompressible on the right and 0.57 on the left. She has multiple shallow ulcers on the bilateral lower extremities. She has 2+ pitting edema and her legs are extremely tense. They are somewhat erythematous and warm. She is currently taking cephalexin prescribed by her primary care provider due to concern for cellulitis. 01/01/2022: All of her wounds are healed. Edema control with the Kerlix and Coban wraps is excellent. She does have compression stockings at home. READMISSION 05/07/2022 The patient returns with a new wound on her right lower anterior leg. She is accompanied by her daughter. They admit that she has not been wearing her compression stockings at home. The patient's daughter reports that this wound started  as a blister that ruptured. She has been applying peroxide. Both indicate that the patient is still frequently sleeping in her wheelchair with her legs dependent. ABI in clinic today was 1.08. The wound is small, just medial to the anterior tibial surface, clean. 06/07/2022: The patient has failed to show up for multiple clinic visits. The patient's daughter says that she simply refuses to come when its time for her appointments. She continues to be quite stubborn about sleeping in her wheelchair with her legs dependent. The wound on her right anterior tibial surface is shallow and just has a bit of eschar buildup. 06/14/2022: She has a new wound on her left anterior tibial surface. It has some slough accumulation. The wound on her right leg has slough and eschar buildup. Edema control is much better this week. 06/21/2022: The wound on her right leg is healed. The wound on her left anterior tibial surface is superficial with just a layer of eschar on the surface. Edema control is very good bilaterally. 06/30/2022: Her left leg has healed. She managed to scrape her anterior tibial surface on the right and she has open abrasions extending into the fat layer. 07/09/2022: The right anterior tibial surface wounds have eschar and adherent silver alginate present. No concern for infection. ADMISSION 02/07/2023 This is a now 82 year old woman who arrives in clinic on her own. She has had previous visits to this clinic in 2023 and more recently earlier this year at which time she had wounds in the right leg with chronic venous insufficiency which she was discharged with stockings. Notably low ABI. 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 The patient has apparently had blisters on her legs for 1 month and they eventually opened up into wounds this is on both  [name], [name] ([id number]) [id number] Page 1 of 6 Visit Report for 02/21/2023 HPI Details Patient Name: Date of Service: [name], [name] 02/21/2023 2:00 PM Medical Record Number: [id number] Patient Account Number: [id number] Date of Birth/Sex: Treating RN: 23-Mar-1941 (82 y.o. F) Primary Care Provider: Norva Riffle Other Clinician: Referring Provider: Treating Provider/Extender: Theodosia Paling in Treatment: 2 History of Present Illness HPI Description: ADMISSION 12/25/2021 This is an 82 year old woman with multiple medical comorbidities including congestive heart failure, hypertension, end-stage renal disease on hemodialysis, polyneuropathy, poorly controlled type 2 diabetes mellitus (last hemoglobin A1c available to me is from December 2022 and was 9.6). She presents to clinic today with multiple open ulcers on her bilateral lower extremities in the setting of severe edema. She says that she has compression stockings but has not worn them in some time because they are too tight. Her wounds are secondary to trauma from her wheelchair. She often sleeps in her wheelchair with her legs hanging down. ABIs in clinic today were noncompressible on the right and 0.57 on the left. She has multiple shallow ulcers on the bilateral lower extremities. She has 2+ pitting edema and her legs are extremely tense. They are somewhat erythematous and warm. She is currently taking cephalexin prescribed by her primary care provider due to concern for cellulitis. 01/01/2022: All of her wounds are healed. Edema control with the Kerlix and Coban wraps is excellent. She does have compression stockings at home. READMISSION 05/07/2022 The patient returns with a new wound on her right lower anterior leg. She is accompanied by her daughter. They admit that she has not been wearing her compression stockings at home. The patient's daughter reports that this wound started  as a blister that ruptured. She has been applying peroxide. Both indicate that the patient is still frequently sleeping in her wheelchair with her legs dependent. ABI in clinic today was 1.08. The wound is small, just medial to the anterior tibial surface, clean. 06/07/2022: The patient has failed to show up for multiple clinic visits. The patient's daughter says that she simply refuses to come when its time for her appointments. She continues to be quite stubborn about sleeping in her wheelchair with her legs dependent. The wound on her right anterior tibial surface is shallow and just has a bit of eschar buildup. 06/14/2022: She has a new wound on her left anterior tibial surface. It has some slough accumulation. The wound on her right leg has slough and eschar buildup. Edema control is much better this week. 06/21/2022: The wound on her right leg is healed. The wound on her left anterior tibial surface is superficial with just a layer of eschar on the surface. Edema control is very good bilaterally. 06/30/2022: Her left leg has healed. She managed to scrape her anterior tibial surface on the right and she has open abrasions extending into the fat layer. 07/09/2022: The right anterior tibial surface wounds have eschar and adherent silver alginate present. No concern for infection. ADMISSION 02/07/2023 This is a now 82 year old woman who arrives in clinic on her own. She has had previous visits to this clinic in 2023 and more recently earlier this year at which time she had wounds in the right leg with chronic venous insufficiency which she was discharged with stockings. Notably low ABI. 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 The patient has apparently had blisters on her legs for 1 month and they eventually opened up into wounds this is on both  legs. She does not complain of pain. She may have been given a course of doxycycline. 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 She lives with the daughter ABI in our clinic was 0.77 on the right 0.38 monophasic on the left. The patient had arterial studies on 02/26/2022 at which time both ABIs were noncompressible TBI in the right was 0.79 on the left 0.43. Waveforms on the right were multiphasic on the left monophasic. 10/14; patient I readmitted to the clinic last week. She has wounds on her bilateral lower legs. The areas on the right are healed today she has 2 remaining open areas on the left. We used TCA Hydrofera Blue and kerlix Coban. I also think the patient probably has significant PAD. She is not all that ambulatory I was not able to elicit any claudication although according to her daughter she sleeps with her legs dangling over the side of the bed. ABIs in our clinic were 0.77 on the right 0.38 and monophasic on the left the patient had arterial studies about a year ago again with noncompressible ABIs on the left and a TBI of 0.43 on the left. 10/21; the patient was seen in follow-up last week. The areas on the right were healed and she had 2 open areas on the left. She comes back in the clinic today with the areas on the left heel but single open area in the right mid tibia. Her vascular appointment is not till sometime in December. We have been using Hydrofera Blue kerlix and Coban. She comes in today with the area on the left leg healed on the right leg with a small open area. The stockings she had were thigh-high's but they were really quite a bit too big for her. I think she is lost weight since these were prescribed [name], [name] ([id number]) [id number] Page 2 of 6 Electronic Signature(s) Signed: 02/21/2023 6:11:24 PM By: Baltazar Najjar MD Entered By: Baltazar Najjar on 02/21/2023  11:47:53 -------------------------------------------------------------------------------- Physical Exam Details Patient Name: Date of Service: [name], [name] 02/21/2023 2:00 PM Medical Record Number: [id number] Patient Account Number: [id number] Date of Birth/Sex: Treating RN: 1940/05/30 (82 y.o. F) Primary Care Provider: Norva Riffle Other Clinician: Referring Provider: Treating Provider/Extender: Theodosia Paling in Treatment: 2 Notes Wound exam; very tiny open area on the right mid tibia. Nothing open on the left leg. Skin is dry and fissured. Electronic Signature(s) Signed: 02/21/2023 6:11:24 PM By: Baltazar Najjar MD Entered By: Baltazar Najjar on 02/21/2023 11:49:40 -------------------------------------------------------------------------------- Physician Orders Details Patient Name: Date of Service: [name], [name] 02/21/2023 2:00 PM Medical Record Number: [id number] Patient Account Number: [id number] Date of Birth/Sex: Treating RN: Feb 08, 1941 (82 y.o. [name] Primary Care Provider: Norva Riffle Other Clinician: Referring Provider: Treating Provider/Extender: Theodosia Paling in Treatment: 2 Verbal / Phone Orders: No Diagnosis Coding Follow-up Appointments ppointment in 1 week. - Dr. Leanord Hawking 2pm Monday room 8 02/28/2023 1100 Return A ****BRING IN COMPRESSION STOCKINGS****** ppointment in 2 weeks. - Dr. Leanord Hawking Return A Other: - Vein and Vascular appt 12/11 Bathing/ Shower/ Hygiene May shower with protection but do not get wound dressing(s) wet. Protect dressing(s) with water repellant cover (for example, large plastic bag) or a cast cover and may then take shower. Edema Control - Lymphedema / SCD / Other Elevate legs to the level of the heart or above for 30  [name], [name] ([id number]) [id number] Page 1 of 6 Visit Report for 02/21/2023 HPI Details Patient Name: Date of Service: [name], [name] 02/21/2023 2:00 PM Medical Record Number: [id number] Patient Account Number: [id number] Date of Birth/Sex: Treating RN: 23-Mar-1941 (82 y.o. F) Primary Care Provider: Norva Riffle Other Clinician: Referring Provider: Treating Provider/Extender: Theodosia Paling in Treatment: 2 History of Present Illness HPI Description: ADMISSION 12/25/2021 This is an 82 year old woman with multiple medical comorbidities including congestive heart failure, hypertension, end-stage renal disease on hemodialysis, polyneuropathy, poorly controlled type 2 diabetes mellitus (last hemoglobin A1c available to me is from December 2022 and was 9.6). She presents to clinic today with multiple open ulcers on her bilateral lower extremities in the setting of severe edema. She says that she has compression stockings but has not worn them in some time because they are too tight. Her wounds are secondary to trauma from her wheelchair. She often sleeps in her wheelchair with her legs hanging down. ABIs in clinic today were noncompressible on the right and 0.57 on the left. She has multiple shallow ulcers on the bilateral lower extremities. She has 2+ pitting edema and her legs are extremely tense. They are somewhat erythematous and warm. She is currently taking cephalexin prescribed by her primary care provider due to concern for cellulitis. 01/01/2022: All of her wounds are healed. Edema control with the Kerlix and Coban wraps is excellent. She does have compression stockings at home. READMISSION 05/07/2022 The patient returns with a new wound on her right lower anterior leg. She is accompanied by her daughter. They admit that she has not been wearing her compression stockings at home. The patient's daughter reports that this wound started  as a blister that ruptured. She has been applying peroxide. Both indicate that the patient is still frequently sleeping in her wheelchair with her legs dependent. ABI in clinic today was 1.08. The wound is small, just medial to the anterior tibial surface, clean. 06/07/2022: The patient has failed to show up for multiple clinic visits. The patient's daughter says that she simply refuses to come when its time for her appointments. She continues to be quite stubborn about sleeping in her wheelchair with her legs dependent. The wound on her right anterior tibial surface is shallow and just has a bit of eschar buildup. 06/14/2022: She has a new wound on her left anterior tibial surface. It has some slough accumulation. The wound on her right leg has slough and eschar buildup. Edema control is much better this week. 06/21/2022: The wound on her right leg is healed. The wound on her left anterior tibial surface is superficial with just a layer of eschar on the surface. Edema control is very good bilaterally. 06/30/2022: Her left leg has healed. She managed to scrape her anterior tibial surface on the right and she has open abrasions extending into the fat layer. 07/09/2022: The right anterior tibial surface wounds have eschar and adherent silver alginate present. No concern for infection. ADMISSION 02/07/2023 This is a now 82 year old woman who arrives in clinic on her own. She has had previous visits to this clinic in 2023 and more recently earlier this year at which time she had wounds in the right leg with chronic venous insufficiency which she was discharged with stockings. Notably low ABI. 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 The patient has apparently had blisters on her legs for 1 month and they eventually opened up into wounds this is on both

## 2023-02-25 NOTE — Progress Notes (Signed)
    Postoperative Access Visit   History of Present Illness   Aderinsola Dolezal is a 82 y.o. year old female who presents with her daughter for postoperative follow-up for: right upper arm arteriovenous graft and ligation of right brachiobasilic AV fistula by Dr. Chestine Spore on 12/29/22. The patient's wounds are well healed.  The patient notes no steal symptoms.  The patient is able to complete their activities of daily living. She has history of prior left BV AV fistula that occluded and right BV fistula that was poorly functioning and ultimately was ligated at time of placement of her recent AV graft.   She currently dialyzes on TTS at right internal jugular [facility]  Physical Examination   Vitals:   02/25/23 0909  BP: (!) 94/59  Pulse: 64  Temp: 98.2 F (36.8 C)  SpO2: 95%  Weight: 140 lb (63.5 kg)  Height: 5\' 4"  (1.626 m)   Body mass index is 24.03 kg/m.  right arm Incisions are well healed, 2+ radial pulse, hand grip is 4/5, sensation in digits is  intact, no palpable thrill, no bruit can be auscultated     Medical Decision Making   Dayami Blomberg is a 82 y.o. year old female who presents s/p right upper arm arteriovenous graft and ligation of right brachiobasilic AV fistula by Dr. Chestine Spore on 12/29/22. The patient's wounds are well healed.  The patient notes no steal symptoms. Unfortunately her right upper arm graft is occluded on examination today. I discussed this with Dr. Chestine Spore and since she has had bilateral accesses that have failed his recommendation is a bilateral upper extremity Venogram to further evaluate her central veins to determine next best access option - Will arrange bilateral upper extremity Venogram with Dr. Chestine Spore in the near future. We will try to schedule this on a non dialysis day   Graceann Congress, PA-C [facility] Office: [phone]  Clinic MD: Rosemarie Ax

## 2023-02-25 NOTE — Telephone Encounter (Signed)
Attempted to reach patient to schedule bilateral upper extremity venogram. Unable to leave message due to VM full.

## 2023-02-25 NOTE — Progress Notes (Signed)
Classification: Unable to visualize wound bed Wound Margin: Distinct, outline attached Exudate Amount: None Present Foul Odor After Cleansing: No Slough/Fibrino Yes Wound Bed Granulation Amount: None Present (0%) Exposed Structure Necrotic Amount: None Present (0%) Fascia Exposed: No Fat Layer (Subcutaneous Tissue) Exposed: No [name] ([id number]) [id number].pdf Page 9 of 11 Tendon Exposed: No Muscle Exposed: No Joint Exposed: No Bone Exposed: No Periwound Skin Texture Texture Color No Abnormalities Noted: No No Abnormalities Noted: No Callus: No Atrophie Blanche: No Crepitus: No Cyanosis: No Excoriation: No Ecchymosis: No Induration: No Erythema: No Rash: No Hemosiderin Staining: Yes Scarring: No Mottled: No Pallor: No Moisture Rubor: No No Abnormalities Noted: No Dry / Scaly: Yes Maceration: No Electronic Signature(s) Signed: 02/24/2023 5:52:17 PM By: Terry Pulling RN, BSN Entered By: [name] on 02/21/2023 11:43:41 -------------------------------------------------------------------------------- Wound Assessment Details Patient Name: Date of Service: [name], [name] Marquez 02/21/2023 2:00 PM Medical Record Number: [id number] Patient Account Number: [id number] Date of Birth/Sex: Treating RN: 07/11/1940 (82 y.o. F) Primary Care [name]: Norva Riffle Other Clinician: Referring [name]: Treating [name]/Extender: [name] in Treatment: 2 Wound Status Wound Number: 15 Primary  Abrasion Etiology: Wound Location: Right, Anterior Lower Leg Wound Open Wounding Event: Bump Status: Date Acquired: 02/20/2023 Comorbid Cataracts, Arrhythmia, Congestive Heart Failure, Hypertension, Weeks Of Treatment: 0 History: Type II Diabetes, End Stage Renal Disease, Neuropathy Clustered Wound: No Photos Wound Measurements Length: (cm) 1 Width: (cm) 0.8 Depth: (cm) 0.1 Area: (cm) 0.628 Volume: (cm) 0.063 % Reduction in Area: % Reduction in Volume: Epithelialization: None Tunneling: No Undermining: No Wound Description Classification: Partial Thickness Exudate Amount: Medium Exudate Type: Serosanguineous Hacker, Latonya ([id number]) Exudate Color: red, brown Foul Odor After Cleansing: No Slough/Fibrino No [id number].pdf Page 10 of 11 Wound Bed Granulation Amount: Large (67-100%) Granulation Quality: Red Necrotic Amount: Small (1-33%) Necrotic Quality: Adherent Slough Periwound Skin Texture Texture Color No Abnormalities Noted: No No Abnormalities Noted: No Callus: No Atrophie Blanche: No Crepitus: No Cyanosis: No Excoriation: No Ecchymosis: No Induration: No Erythema: No Rash: No Hemosiderin Staining: No Scarring: No Mottled: No Pallor: No Moisture Rubor: No No Abnormalities Noted: No Dry / Scaly: No Temperature / Pain Maceration: No Temperature: No Abnormality Treatment Notes Wound #15 (Lower Leg) Wound Laterality: Right, Anterior Cleanser Soap and Water Discharge Instruction: May shower and wash wound with dial antibacterial soap and water prior to dressing change. Vashe 5.8 (oz) Discharge Instruction: Cleanse the wound with Vashe prior to applying a clean dressing using gauze sponges, not tissue or cotton balls. Peri-Wound Care Triamcinolone 15 (g) Discharge Instruction: Use triamcinolone 15 (g) as directed Sween Lotion (Moisturizing lotion) Discharge Instruction: Apply moisturizing lotion as directed Topical Primary  Dressing Hydrofera Blue Ready Transfer Foam, 2.5x2.5 (in/in) Discharge Instruction: Apply directly to wound bed as directed Secondary Dressing ABD Pad, 8x10 Discharge Instruction: Apply over primary dressing as directed. Secured With Compression Wrap Kerlix Roll 4.5x3.1 (in/yd) Discharge Instruction: Apply Kerlix and Coban compression as directed. Coban Self-Adherent Wrap 4x5 (in/yd) Discharge Instruction: Apply over Kerlix as directed. Compression Stockings Add-Ons Electronic Signature(s) Signed: 02/24/2023 4:59:00 PM By: [name] Entered By: [name] on 02/21/2023 11:24:06 Vitals Details -------------------------------------------------------------------------------- [name] ([id number]) [id number] Page 11 of 11 Patient Name: Date of Service: [name], [name] 02/21/2023 2:00 PM Medical Record Number: [id number] Patient Account Number: [id number] Date of Birth/Sex: Treating RN: [birth year] (82 y.o. [name] Primary Care [name]: Norva Riffle Other Clinician: Referring [name]: Treating [name]/Extender: [name] in Treatment: 2 Vital Signs Time Taken:  Index(BMI): 27.5 Temperature(F): 98.5 Respiratory Rate(breaths/min): 18 [13:Photos:] Left, Distal, Anterior Lower Leg Left, Medial Lower Leg Right, Anterior Lower Leg Wound Location: Blister Blister Bump Wounding Event: Diabetic Wound/Ulcer of the Lower Diabetic Wound/Ulcer of the Lower Abrasion Primary Etiology: Extremity Extremity Lymphedema Lymphedema N/A Secondary Etiology: Cataracts, Arrhythmia, Congestive Cataracts, Arrhythmia, Congestive Cataracts, Arrhythmia, Congestive Comorbid History: Heart Failure, Hypertension, Type II Heart Failure, Hypertension, Type II Heart Failure, Hypertension, Type II Diabetes, End Stage Renal Disease, Diabetes, End Stage Renal Disease, Diabetes, End Stage Renal Disease, Neuropathy Neuropathy Neuropathy 01/03/2023 01/03/2023 02/20/2023 Date Acquired: 2 2 0 Weeks of Treatment: Open Open Open Wound Status: No No No Wound Recurrence: 0.1x0.1x0.1 0.1x0.1x0.1 1x0.8x0.1 Measurements L x W x D (cm) 0.008 0.008 0.628 A (cm) : rea 0.001 0.001 0.063 Volume (cm) : 99.90% 99.40% N/A % Reduction in A rea: 99.90% 99.30% N/A % Reduction in Volume: Unable to visualize wound bed Unable to visualize wound bed Partial Thickness Classification: Small Medium  Medium Exudate A mount: Serosanguineous Serosanguineous Serosanguineous Exudate Type: red, brown red, brown red, brown Exudate ColorAMELAH, [name] ([id number]) [phone].pdf Page 5 of 11 Distinct, outline attached Distinct, outline attached N/A Wound Margin: Large (67-100%) None Present (0%) Large (67-100%) Granulation Amount: Pink N/A Red Granulation Quality: None Present (0%) None Present (0%) Small (1-33%) Necrotic Amount: Fat Layer (Subcutaneous Tissue): Yes Fat Layer (Subcutaneous Tissue): Yes N/A Exposed Structures: Fascia: No Fascia: No Tendon: No Tendon: No Muscle: No Muscle: No Joint: No Joint: No Bone: No Bone: No Large (67-100%) Large (67-100%) None Epithelialization: Excoriation: No Excoriation: No Excoriation: No Periwound Skin Texture: Induration: No Induration: No Induration: No Callus: No Callus: No Callus: No Crepitus: No Crepitus: No Crepitus: No Rash: No Rash: No Rash: No Scarring: No Scarring: No Scarring: No Dry/Scaly: Yes Dry/Scaly: Yes Maceration: No Periwound Skin Moisture: Maceration: No Maceration: No Dry/Scaly: No Hemosiderin Staining: Yes Hemosiderin Staining: Yes Atrophie Blanche: No Periwound Skin Color: Atrophie Blanche: No Atrophie Blanche: No Cyanosis: No Cyanosis: No Cyanosis: No Ecchymosis: No Ecchymosis: No Ecchymosis: No Erythema: No Erythema: No Erythema: No Hemosiderin Staining: No Mottled: No Mottled: No Mottled: No Pallor: No Pallor: No Pallor: No Rubor: No Rubor: No Rubor: No N/A N/A No Abnormality Temperature: Treatment Notes Electronic Signature(s) Signed: 02/21/2023 6:11:24 PM By: Baltazar Najjar MD Entered By: Baltazar Najjar on 02/21/2023 11:42:03 -------------------------------------------------------------------------------- Multi-Disciplinary Care Plan Details Patient Name: Date of Service: [name], [name] 02/21/2023 2:00 PM Medical Record Number:  [id number] Patient Account Number: [id number] Date of Birth/Sex: Treating RN: March 30, 1941 (82 y.o. [name] Primary Care Alyanah Elliott: Norva Riffle Other Clinician: Referring Marai Teehan: Treating Deah Ottaway/Extender: [name] in Treatment: 2 Active Inactive Tissue Oxygenation Nursing Diagnoses: Knowledge deficit related to disease process and management Potential alteration in peripheral tissue perfusion (select prior to confirmation of diagnosis) Goals: Non-invasive arterial studies are completed as ordered Date Initiated: 02/07/2023 Target Resolution Date: 03/11/2023 Goal Status: Active Interventions: Assess patient understanding of disease process and management upon diagnosis and as needed Assess peripheral arterial status upon admission and as needed Provide education on tissue oxygenation and ischemia Treatment Activities: Ankle Brachial Index (ABI) : 02/07/2023 Non-invasive vascular studies : 02/07/2023 Notes: [name], [name] ([id number]) [id number].pdf Page 6 of 11 Electronic Signature(s) Signed: 02/24/2023 5:52:17 PM By: Terry Pulling RN, BSN Entered By: [name] on 02/21/2023 11:27:01 -------------------------------------------------------------------------------- Pain Assessment Details Patient Name: Date of Service: [name], [name] 02/21/2023 2:00 PM Medical Record Number: [id number] Patient Account Number: [id number] Date of Birth/Sex: Treating RN: 11-Jun-1940 (82  [name], [name] ([id number]) [id number] Page 1 of 11 Visit Report for 02/21/2023 Arrival Information Details Patient Name: Date of Service: [name], [name] 02/21/2023 2:00 PM Medical Record Number: [id number] Patient Account Number: [id number] Date of Birth/Sex: Treating RN: [birth year] (82 y.o. F) Primary Care Ameris Akamine: Norva Riffle Other Clinician: Referring Dj Senteno: Treating Sheffield Hawker/Extender: [name] in Treatment: 2 Visit Information History Since Last Visit Added or deleted any medications: No Patient Arrived: Wheel Chair Any new allergies or adverse reactions: No Arrival Time: 13:58 Had a fall or experienced change in No Accompanied By: daughter activities of daily living that may affect Transfer Assistance: None risk of falls: Patient Identification Verified: Yes Signs or symptoms of abuse/neglect since last visito No Secondary Verification Process Completed: Yes Hospitalized since last visit: No Patient Requires Transmission-Based Precautions: No Implantable device outside of the clinic excluding No Patient Has Alerts: No cellular tissue based products placed in the center since last visit: Has Dressing in Place as Prescribed: Yes Has Compression in Place as Prescribed: Yes Pain Present Now: Yes Electronic Signature(s) Signed: 02/24/2023 4:59:00 PM By: [name] Entered By: [name] on 02/21/2023 11:12:10 -------------------------------------------------------------------------------- Clinic Level of Care Assessment Details Patient Name: Date of Service: [name], [name] 02/21/2023 2:00 PM Medical Record Number: [id number] Patient Account Number: [id number] Date of Birth/Sex: Treating RN: May 21, 1940 (82 y.o. [name] Primary Care Felissa Blouch: Norva Riffle Other Clinician: Referring Kayleigh Broadwell: Treating Florina Glas/Extender: [name] in Treatment: 2 Clinic  Level of Care Assessment Items TOOL 4 Quantity Score X- 1 0 Use when only an EandM is performed on FOLLOW-UP visit ASSESSMENTS - Nursing Assessment / Reassessment X- 1 10 Reassessment of Co-morbidities (includes updates in patient status) X- 1 5 Reassessment of Adherence to Treatment Plan ASSESSMENTS - Wound and Skin A ssessment / Reassessment X - Simple Wound Assessment / Reassessment - one wound 1 5 []  - 0 Complex Wound Assessment / Reassessment - multiple wounds []  - 0 Dermatologic / Skin Assessment (not related to wound area) ASSESSMENTS - Focused Assessment X- 2 5 Circumferential Edema Measurements - multi extremities []  - 0 Nutritional Assessment / Counseling / Intervention TALYN, MESNARD ([id number]) [id number] Page 2 of 11 []  - 0 Lower Extremity Assessment (monofilament, tuning fork, pulses) []  - 0 Peripheral Arterial Disease Assessment (using hand held doppler) ASSESSMENTS - Ostomy and/or Continence Assessment and Care []  - 0 Incontinence Assessment and Management []  - 0 Ostomy Care Assessment and Management (repouching, etc.) PROCESS - Coordination of Care X - Simple Patient / Family Education for ongoing care 1 15 []  - 0 Complex (extensive) Patient / Family Education for ongoing care X- 1 10 Staff obtains Chiropractor, Records, T Results / Process Orders est []  - 0 Staff telephones HHA, Nursing Homes / Clarify orders / etc []  - 0 Routine Transfer to another Facility (non-emergent condition) []  - 0 Routine Hospital Admission (non-emergent condition) []  - 0 New Admissions / Manufacturing engineer / Ordering NPWT Apligraf, etc. , []  - 0 Emergency Hospital Admission (emergent condition) []  - 0 Simple Discharge Coordination []  - 0 Complex (extensive) Discharge Coordination PROCESS - Special Needs []  - 0 Pediatric / Minor Patient Management []  - 0 Isolation Patient Management []  - 0 Hearing / Language / Visual special needs []  -  0 Assessment of Community assistance (transportation, D/C planning, etc.) []  - 0 Additional assistance / Altered mentation []  - 0 Support Surface(s) Assessment (bed, cushion, seat, etc.) INTERVENTIONS - Wound Cleansing / Measurement []  - 0  Classification: Unable to visualize wound bed Wound Margin: Distinct, outline attached Exudate Amount: None Present Foul Odor After Cleansing: No Slough/Fibrino Yes Wound Bed Granulation Amount: None Present (0%) Exposed Structure Necrotic Amount: None Present (0%) Fascia Exposed: No Fat Layer (Subcutaneous Tissue) Exposed: No [name] ([id number]) [id number].pdf Page 9 of 11 Tendon Exposed: No Muscle Exposed: No Joint Exposed: No Bone Exposed: No Periwound Skin Texture Texture Color No Abnormalities Noted: No No Abnormalities Noted: No Callus: No Atrophie Blanche: No Crepitus: No Cyanosis: No Excoriation: No Ecchymosis: No Induration: No Erythema: No Rash: No Hemosiderin Staining: Yes Scarring: No Mottled: No Pallor: No Moisture Rubor: No No Abnormalities Noted: No Dry / Scaly: Yes Maceration: No Electronic Signature(s) Signed: 02/24/2023 5:52:17 PM By: Terry Pulling RN, BSN Entered By: [name] on 02/21/2023 11:43:41 -------------------------------------------------------------------------------- Wound Assessment Details Patient Name: Date of Service: [name], [name] Marquez 02/21/2023 2:00 PM Medical Record Number: [id number] Patient Account Number: [id number] Date of Birth/Sex: Treating RN: 07/11/1940 (82 y.o. F) Primary Care [name]: Norva Riffle Other Clinician: Referring [name]: Treating [name]/Extender: [name] in Treatment: 2 Wound Status Wound Number: 15 Primary  Abrasion Etiology: Wound Location: Right, Anterior Lower Leg Wound Open Wounding Event: Bump Status: Date Acquired: 02/20/2023 Comorbid Cataracts, Arrhythmia, Congestive Heart Failure, Hypertension, Weeks Of Treatment: 0 History: Type II Diabetes, End Stage Renal Disease, Neuropathy Clustered Wound: No Photos Wound Measurements Length: (cm) 1 Width: (cm) 0.8 Depth: (cm) 0.1 Area: (cm) 0.628 Volume: (cm) 0.063 % Reduction in Area: % Reduction in Volume: Epithelialization: None Tunneling: No Undermining: No Wound Description Classification: Partial Thickness Exudate Amount: Medium Exudate Type: Serosanguineous Hacker, Latonya ([id number]) Exudate Color: red, brown Foul Odor After Cleansing: No Slough/Fibrino No [id number].pdf Page 10 of 11 Wound Bed Granulation Amount: Large (67-100%) Granulation Quality: Red Necrotic Amount: Small (1-33%) Necrotic Quality: Adherent Slough Periwound Skin Texture Texture Color No Abnormalities Noted: No No Abnormalities Noted: No Callus: No Atrophie Blanche: No Crepitus: No Cyanosis: No Excoriation: No Ecchymosis: No Induration: No Erythema: No Rash: No Hemosiderin Staining: No Scarring: No Mottled: No Pallor: No Moisture Rubor: No No Abnormalities Noted: No Dry / Scaly: No Temperature / Pain Maceration: No Temperature: No Abnormality Treatment Notes Wound #15 (Lower Leg) Wound Laterality: Right, Anterior Cleanser Soap and Water Discharge Instruction: May shower and wash wound with dial antibacterial soap and water prior to dressing change. Vashe 5.8 (oz) Discharge Instruction: Cleanse the wound with Vashe prior to applying a clean dressing using gauze sponges, not tissue or cotton balls. Peri-Wound Care Triamcinolone 15 (g) Discharge Instruction: Use triamcinolone 15 (g) as directed Sween Lotion (Moisturizing lotion) Discharge Instruction: Apply moisturizing lotion as directed Topical Primary  Dressing Hydrofera Blue Ready Transfer Foam, 2.5x2.5 (in/in) Discharge Instruction: Apply directly to wound bed as directed Secondary Dressing ABD Pad, 8x10 Discharge Instruction: Apply over primary dressing as directed. Secured With Compression Wrap Kerlix Roll 4.5x3.1 (in/yd) Discharge Instruction: Apply Kerlix and Coban compression as directed. Coban Self-Adherent Wrap 4x5 (in/yd) Discharge Instruction: Apply over Kerlix as directed. Compression Stockings Add-Ons Electronic Signature(s) Signed: 02/24/2023 4:59:00 PM By: [name] Entered By: [name] on 02/21/2023 11:24:06 Vitals Details -------------------------------------------------------------------------------- [name] ([id number]) [id number] Page 11 of 11 Patient Name: Date of Service: [name], [name] 02/21/2023 2:00 PM Medical Record Number: [id number] Patient Account Number: [id number] Date of Birth/Sex: Treating RN: [birth year] (82 y.o. [name] Primary Care [name]: Norva Riffle Other Clinician: Referring [name]: Treating [name]/Extender: [name] in Treatment: 2 Vital Signs Time Taken:  Classification: Unable to visualize wound bed Wound Margin: Distinct, outline attached Exudate Amount: None Present Foul Odor After Cleansing: No Slough/Fibrino Yes Wound Bed Granulation Amount: None Present (0%) Exposed Structure Necrotic Amount: None Present (0%) Fascia Exposed: No Fat Layer (Subcutaneous Tissue) Exposed: No [name] ([id number]) [id number].pdf Page 9 of 11 Tendon Exposed: No Muscle Exposed: No Joint Exposed: No Bone Exposed: No Periwound Skin Texture Texture Color No Abnormalities Noted: No No Abnormalities Noted: No Callus: No Atrophie Blanche: No Crepitus: No Cyanosis: No Excoriation: No Ecchymosis: No Induration: No Erythema: No Rash: No Hemosiderin Staining: Yes Scarring: No Mottled: No Pallor: No Moisture Rubor: No No Abnormalities Noted: No Dry / Scaly: Yes Maceration: No Electronic Signature(s) Signed: 02/24/2023 5:52:17 PM By: Terry Pulling RN, BSN Entered By: [name] on 02/21/2023 11:43:41 -------------------------------------------------------------------------------- Wound Assessment Details Patient Name: Date of Service: [name], [name] Marquez 02/21/2023 2:00 PM Medical Record Number: [id number] Patient Account Number: [id number] Date of Birth/Sex: Treating RN: 07/11/1940 (82 y.o. F) Primary Care [name]: Norva Riffle Other Clinician: Referring [name]: Treating [name]/Extender: [name] in Treatment: 2 Wound Status Wound Number: 15 Primary  Abrasion Etiology: Wound Location: Right, Anterior Lower Leg Wound Open Wounding Event: Bump Status: Date Acquired: 02/20/2023 Comorbid Cataracts, Arrhythmia, Congestive Heart Failure, Hypertension, Weeks Of Treatment: 0 History: Type II Diabetes, End Stage Renal Disease, Neuropathy Clustered Wound: No Photos Wound Measurements Length: (cm) 1 Width: (cm) 0.8 Depth: (cm) 0.1 Area: (cm) 0.628 Volume: (cm) 0.063 % Reduction in Area: % Reduction in Volume: Epithelialization: None Tunneling: No Undermining: No Wound Description Classification: Partial Thickness Exudate Amount: Medium Exudate Type: Serosanguineous Hacker, Latonya ([id number]) Exudate Color: red, brown Foul Odor After Cleansing: No Slough/Fibrino No [id number].pdf Page 10 of 11 Wound Bed Granulation Amount: Large (67-100%) Granulation Quality: Red Necrotic Amount: Small (1-33%) Necrotic Quality: Adherent Slough Periwound Skin Texture Texture Color No Abnormalities Noted: No No Abnormalities Noted: No Callus: No Atrophie Blanche: No Crepitus: No Cyanosis: No Excoriation: No Ecchymosis: No Induration: No Erythema: No Rash: No Hemosiderin Staining: No Scarring: No Mottled: No Pallor: No Moisture Rubor: No No Abnormalities Noted: No Dry / Scaly: No Temperature / Pain Maceration: No Temperature: No Abnormality Treatment Notes Wound #15 (Lower Leg) Wound Laterality: Right, Anterior Cleanser Soap and Water Discharge Instruction: May shower and wash wound with dial antibacterial soap and water prior to dressing change. Vashe 5.8 (oz) Discharge Instruction: Cleanse the wound with Vashe prior to applying a clean dressing using gauze sponges, not tissue or cotton balls. Peri-Wound Care Triamcinolone 15 (g) Discharge Instruction: Use triamcinolone 15 (g) as directed Sween Lotion (Moisturizing lotion) Discharge Instruction: Apply moisturizing lotion as directed Topical Primary  Dressing Hydrofera Blue Ready Transfer Foam, 2.5x2.5 (in/in) Discharge Instruction: Apply directly to wound bed as directed Secondary Dressing ABD Pad, 8x10 Discharge Instruction: Apply over primary dressing as directed. Secured With Compression Wrap Kerlix Roll 4.5x3.1 (in/yd) Discharge Instruction: Apply Kerlix and Coban compression as directed. Coban Self-Adherent Wrap 4x5 (in/yd) Discharge Instruction: Apply over Kerlix as directed. Compression Stockings Add-Ons Electronic Signature(s) Signed: 02/24/2023 4:59:00 PM By: [name] Entered By: [name] on 02/21/2023 11:24:06 Vitals Details -------------------------------------------------------------------------------- [name] ([id number]) [id number] Page 11 of 11 Patient Name: Date of Service: [name], [name] 02/21/2023 2:00 PM Medical Record Number: [id number] Patient Account Number: [id number] Date of Birth/Sex: Treating RN: [birth year] (82 y.o. [name] Primary Care [name]: Norva Riffle Other Clinician: Referring [name]: Treating [name]/Extender: [name] in Treatment: 2 Vital Signs Time Taken:  Simple Wound Cleansing - one wound X- 2 5 Complex Wound Cleansing - multiple wounds X- 1 5 Wound Imaging (photographs - any number of wounds) []  - 0 Wound Tracing (instead of photographs) X- 1 5 Simple Wound Measurement - one wound []  - 0 Complex Wound Measurement - multiple wounds INTERVENTIONS - Wound Dressings []  - 0 Small Wound Dressing one or multiple wounds []  - 0 Medium Wound Dressing one or multiple wounds X- 2 20 Large Wound Dressing one or multiple wounds []  - 0 Application of Medications - topical []  - 0 Application of Medications - injection INTERVENTIONS - Miscellaneous []  - 0 External ear exam []  - 0 Specimen Collection (cultures, biopsies, blood, body fluids, etc.) []  - 0 Specimen(s) / Culture(s) sent or taken to Lab for analysis []  - 0 Patient Transfer (multiple staff / Nurse, adult / Similar devices) []  - 0 Simple Staple / Suture removal (25 or less) []  - 0 Complex Staple / Suture removal (26 or more) []  - 0 Hypo / Hyperglycemic Management (close monitor of Blood Glucose) Blaze, [name] ([id number]) [phone].pdf Page 3 of 11 []  - 0 Ankle / Brachial Index (ABI) - do not check if billed separately X- 1 5 Vital Signs Has the patient been seen at the hospital within the last three years: Yes Total Score: 120 Level Of Care: New/Established - Level 4 Electronic Signature(s) Signed: 02/24/2023 5:52:17 PM By: Terry Pulling RN, BSN Entered By: [name] on 02/21/2023 12:00:40 -------------------------------------------------------------------------------- Encounter Discharge Information Details Patient Name: Date of Service: JENNARAE, KNECHTEL Marquez 02/21/2023 2:00 PM Medical Record Number:  [id number] Patient Account Number: [id number] Date of Birth/Sex: Treating RN: October 25, 1940 (82 y.o. [name] Primary Care Avriel Kandel: Norva Riffle Other Clinician: Referring Kanylah Muench: Treating Cleva Camero/Extender: [name] in Treatment: 2 Encounter Discharge Information Items Discharge Condition: Stable Ambulatory Status: Wheelchair Discharge Destination: Home Transportation: Private Auto Accompanied By: self Schedule Follow-up Appointment: Yes Clinical Summary of Care: Patient Declined Electronic Signature(s) Signed: 02/24/2023 5:52:17 PM By: Terry Pulling RN, BSN Entered By: [name] on 02/21/2023 12:01:27 -------------------------------------------------------------------------------- Lower Extremity Assessment Details Patient Name: Date of Service: ERSA, SENSENEY Marquez 02/21/2023 2:00 PM Medical Record Number: [id number] Patient Account Number: [id number] Date of Birth/Sex: Treating RN: [birth year] (82 y.o. [name] Primary Care Karter Hellmer: Norva Riffle Other Clinician: Referring Requan Hardge: Treating Krishiv Sandler/Extender: [name] in Treatment: 2 Edema Assessment Assessed: Kyra Searles: No] Franne Forts: No] Edema: [Left: No] [Right: No] Calf Left: Right: Point of Measurement: 31 cm From Medial Instep 27 cm 29 cm Ankle Left: Right: Point of Measurement: 10 cm From Medial Instep 19.5 cm 20 cm Knee To Floor Left: Right: KEALANI, BEAUDREAU ([id number]) [phone].pdf Page 4 of 11 From Medial Instep 43 cm 43 cm Vascular Assessment Pulses: Dorsalis Pedis Palpable: [Left:Yes] [Right:Yes] Extremity colors, hair growth, and conditions: Extremity Color: [Left:Hyperpigmented] [Right:Hyperpigmented] Hair Growth on Extremity: [Left:No] [Right:No] Temperature of Extremity: [Left:Warm] [Right:Warm] Capillary Refill: [Left:> 3 seconds] [Right:> 3 seconds] Dependent Rubor: [Left:No Yes] [Right:No  Yes] Electronic Signature(s) Signed: 02/24/2023 5:52:17 PM By: Terry Pulling RN, BSN Entered By: [name] on 02/21/2023 11:40:03 -------------------------------------------------------------------------------- Multi Wound Chart Details Patient Name: Date of Service: Milon Score A Marquez 02/21/2023 2:00 PM Medical Record Number: [id number] Patient Account Number: [id number] Date of Birth/Sex: Treating RN: 1941-02-07 (82 y.o. F) Primary Care Makayli Bracken: Norva Riffle Other Clinician: Referring Nakyla Bracco: Treating Julienne Vogler/Extender: [name] in Treatment: 2 Vital Signs Height(in): 64 Pulse(bpm): 71 Weight(lbs): 160 Blood Pressure(mmHg): 129/72 Body Mass

## 2023-02-28 ENCOUNTER — Encounter (HOSPITAL_BASED_OUTPATIENT_CLINIC_OR_DEPARTMENT_OTHER): Payer: Medicare HMO | Admitting: Internal Medicine

## 2023-02-28 DIAGNOSIS — E11622 Type 2 diabetes mellitus with other skin ulcer: Secondary | ICD-10-CM | POA: Diagnosis not present

## 2023-02-28 NOTE — Progress Notes (Signed)
[name], [name] ([id number]) [id number] Page 1 of 1 Visit Report for 02/28/2023 Fall Risk Assessment Details Patient Name: Date of Service: [name], [name] 02/28/2023 11:00 A M Medical Record Number: [id number] Patient Account Number: [id number] Date of Birth/Sex: Treating RN: Aug 14, 1940 (82 y.o. [name] Primary Care Walton Digilio: Norva Riffle Other Clinician: Referring Crosby Bevan: Treating Math Brazie/Extender: Theodosia Paling in Treatment: 3 Fall Risk Assessment Items Have you had 2 or more falls in the last 12 monthso 0 Yes Have you had any fall that resulted in injury in the last 12 monthso 0 No FALLS RISK SCREEN History of falling - immediate or within 3 months 25 Yes Secondary diagnosis (Do you have 2 or more medical diagnoseso) 15 Yes Ambulatory aid None/bed rest/wheelchair/nurse 0 No Crutches/cane/walker 15 Yes Furniture 0 No Intravenous therapy Access/Saline/Heparin Lock 0 No Gait/Transferring Normal/ bed rest/ wheelchair 0 No Weak (short steps with or without shuffle, stooped but able to lift head while walking, may seek 10 Yes support from furniture) Impaired (short steps with shuffle, may have difficulty arising from chair, head down, impaired 0 No balance) Mental Status Oriented to own ability 0 Yes Electronic Signature(s) Signed: 02/28/2023 4:03:55 PM By: Redmond Pulling RN, BSN Entered By: Redmond Pulling on 02/28/2023 11:18:06

## 2023-03-01 NOTE — Progress Notes (Signed)
[name], [name] ([id number]) [id number] Page 1 of 6 Visit Report for 02/28/2023 HPI Details Patient Name: Date of Service: [name], [name] Marquez 02/28/2023 11:00 A M Medical Record Number: [id number] Patient Account Number: [id number] Date of Birth/Sex: Treating RN: 06/16/1940 (82 y.o. F) Primary Care Provider: Norva Riffle Other Clinician: Referring Provider: Treating Provider/Extender: Theodosia Paling in Treatment: 3 History of Present Illness HPI Description: ADMISSION 12/25/2021 This is an 82 year old woman with multiple medical comorbidities including congestive heart failure, hypertension, end-stage renal disease on hemodialysis, polyneuropathy, poorly controlled type 2 diabetes mellitus (last hemoglobin A1c available to me is from December 2022 and was 9.6). She presents to clinic today with multiple open ulcers on her bilateral lower extremities in the setting of severe edema. She says that she has compression stockings but has not worn them in some time because they are too tight. Her wounds are secondary to trauma from her wheelchair. She often sleeps in her wheelchair with her legs hanging down. ABIs in clinic today were noncompressible on the right and 0.57 on the left. She has multiple shallow ulcers on the bilateral lower extremities. She has 2+ pitting edema and her legs are extremely tense. They are somewhat erythematous and warm. She is currently taking cephalexin prescribed by her primary care provider due to concern for cellulitis. 01/01/2022: All of her wounds are healed. Edema control with the Kerlix and Coban wraps is excellent. She does have compression stockings at home. READMISSION 05/07/2022 The patient returns with a new wound on her right lower anterior leg. She is accompanied by her daughter. They admit that she has not been wearing her compression stockings at home. The patient's daughter reports that this wound  started as a blister that ruptured. She has been applying peroxide. Both indicate that the patient is still frequently sleeping in her wheelchair with her legs dependent. ABI in clinic today was 1.08. The wound is small, just medial to the anterior tibial surface, clean. 06/07/2022: The patient has failed to show up for multiple clinic visits. The patient's daughter says that she simply refuses to come when its time for her appointments. She continues to be quite stubborn about sleeping in her wheelchair with her legs dependent. The wound on her right anterior tibial surface is shallow and just has a bit of eschar buildup. 06/14/2022: She has a new wound on her left anterior tibial surface. It has some slough accumulation. The wound on her right leg has slough and eschar buildup. Edema control is much better this week. 06/21/2022: The wound on her right leg is healed. The wound on her left anterior tibial surface is superficial with just a layer of eschar on the surface. Edema control is very good bilaterally. 06/30/2022: Her left leg has healed. She managed to scrape her anterior tibial surface on the right and she has open abrasions extending into the fat layer. 07/09/2022: The right anterior tibial surface wounds have eschar and adherent silver alginate present. No concern for infection. ADMISSION 02/07/2023 This is a now 82 year old woman who arrives in clinic on her own. She has had previous visits to this clinic in 2023 and more recently earlier this year at which time she had wounds in the right leg with chronic venous insufficiency which she was discharged with stockings. Notably low ABI. 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 The patient has apparently had blisters on her legs for 1 month and they eventually opened up into wounds this is on  Vein and Vascular appt 12/11 Bathing/ Shower/  Hygiene: May shower with protection but do not get wound dressing(s) wet. Protect dressing(s) with water repellant cover (for example, large plastic bag) or a cast cover and may then take shower. Edema Control - Orders / Instructions: Elevate legs to the level of the heart or above for 30 minutes daily and/or when sitting for 3-4 times a day throughout the day. Avoid standing for long periods of time. Patient to wear own compression stockings every day. - left leg apply in the morning and remove at night. Exercise regularly Moisturize legs daily. - left leg every night before bed. WOUND #15: - Lower Leg Wound Laterality: Right, Anterior Cleanser: Soap and Water 1 x Per Week/30 Days Discharge Instructions: May shower and wash wound with dial antibacterial soap and water prior to dressing change. Cleanser: Vashe 5.8 (oz) 1 x Per Week/30 Days Discharge Instructions: Cleanse the wound with Vashe prior to applying a clean dressing using gauze sponges, not tissue or cotton balls. Peri-Wound Care: Triamcinolone 15 (g) 1 x Per Week/30 Days Discharge Instructions: Use triamcinolone 15 (g) as directed Peri-Wound Care: Sween Lotion (Moisturizing lotion) 1 x Per Week/30 Days [name], [name] ([id number]) [id number] Page 6 of 6 Discharge Instructions: Apply moisturizing lotion as directed Prim Dressing: Hydrofera Blue Ready Transfer Foam, 2.5x2.5 (in/in) 1 x Per Week/30 Days ary Discharge Instructions: Apply directly to wound bed as directed Secondary Dressing: ABD Pad, 8x10 1 x Per Week/30 Days Discharge Instructions: Apply over primary dressing as directed. Com pression Wrap: Kerlix Roll 4.5x3.1 (in/yd) 1 x Per Week/30 Days Discharge Instructions: Apply Kerlix and Coban compression as directed. Com pression Wrap: Coban Self-Adherent Wrap 4x5 (in/yd) 1 x Per Week/30 Days Discharge Instructions: Apply over Kerlix as directed. 1. Still 1 tiny weeping area on the right anterior  lower leg #2 no change to the Hydrofera Blue ABD kerlix Coban dressings Electronic Signature(s) Signed: 03/01/2023 8:53:04 AM By: Baltazar Najjar MD Entered By: Baltazar Najjar on 02/28/2023 09:10:53 -------------------------------------------------------------------------------- SuperBill Details Patient Name: Date of Service: [name], [name] Marquez 02/28/2023 Medical Record Number: [id number] Patient Account Number: [id number] Date of Birth/Sex: Treating RN: April 26, 1941 (82 y.o. [name], Millard.Loa Primary Care Provider: Norva Riffle Other Clinician: Referring Provider: Treating Provider/Extender: Theodosia Paling in Treatment: 3 Diagnosis Coding ICD-10 Codes Code Description [phone] Chronic venous hypertension (idiopathic) with ulcer and inflammation of bilateral lower extremity L97.828 Non-pressure chronic ulcer of other part of left lower leg with other specified severity L97.818 Non-pressure chronic ulcer of other part of right lower leg with other specified severity E11.51 Type 2 diabetes mellitus with diabetic peripheral angiopathy 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 Facility Procedures : CPT4 Code: 21308657 Description: 99214 - WOUND CARE VISIT-LEV 4 EST PT Modifier: Quantity: 1 Physician Procedures : CPT4 Code Description Modifier [id number] 99213 - WC PHYS LEVEL 3 - EST PT ICD-10 Diagnosis Description 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 L97.818 Non-pressure chronic ulcer of other  part of right lower leg with other specified severity Quantity: 1 Electronic Signature(s) Signed: 03/01/2023 8:53:04 AM By: Baltazar Najjar MD Entered By: Baltazar Najjar on 02/28/2023 09:11:10  Vein and Vascular appt 12/11 Bathing/ Shower/  Hygiene: May shower with protection but do not get wound dressing(s) wet. Protect dressing(s) with water repellant cover (for example, large plastic bag) or a cast cover and may then take shower. Edema Control - Orders / Instructions: Elevate legs to the level of the heart or above for 30 minutes daily and/or when sitting for 3-4 times a day throughout the day. Avoid standing for long periods of time. Patient to wear own compression stockings every day. - left leg apply in the morning and remove at night. Exercise regularly Moisturize legs daily. - left leg every night before bed. WOUND #15: - Lower Leg Wound Laterality: Right, Anterior Cleanser: Soap and Water 1 x Per Week/30 Days Discharge Instructions: May shower and wash wound with dial antibacterial soap and water prior to dressing change. Cleanser: Vashe 5.8 (oz) 1 x Per Week/30 Days Discharge Instructions: Cleanse the wound with Vashe prior to applying a clean dressing using gauze sponges, not tissue or cotton balls. Peri-Wound Care: Triamcinolone 15 (g) 1 x Per Week/30 Days Discharge Instructions: Use triamcinolone 15 (g) as directed Peri-Wound Care: Sween Lotion (Moisturizing lotion) 1 x Per Week/30 Days [name], [name] ([id number]) [id number] Page 6 of 6 Discharge Instructions: Apply moisturizing lotion as directed Prim Dressing: Hydrofera Blue Ready Transfer Foam, 2.5x2.5 (in/in) 1 x Per Week/30 Days ary Discharge Instructions: Apply directly to wound bed as directed Secondary Dressing: ABD Pad, 8x10 1 x Per Week/30 Days Discharge Instructions: Apply over primary dressing as directed. Com pression Wrap: Kerlix Roll 4.5x3.1 (in/yd) 1 x Per Week/30 Days Discharge Instructions: Apply Kerlix and Coban compression as directed. Com pression Wrap: Coban Self-Adherent Wrap 4x5 (in/yd) 1 x Per Week/30 Days Discharge Instructions: Apply over Kerlix as directed. 1. Still 1 tiny weeping area on the right anterior  lower leg #2 no change to the Hydrofera Blue ABD kerlix Coban dressings Electronic Signature(s) Signed: 03/01/2023 8:53:04 AM By: Baltazar Najjar MD Entered By: Baltazar Najjar on 02/28/2023 09:10:53 -------------------------------------------------------------------------------- SuperBill Details Patient Name: Date of Service: [name], [name] Marquez 02/28/2023 Medical Record Number: [id number] Patient Account Number: [id number] Date of Birth/Sex: Treating RN: April 26, 1941 (82 y.o. [name], Millard.Loa Primary Care Provider: Norva Riffle Other Clinician: Referring Provider: Treating Provider/Extender: Theodosia Paling in Treatment: 3 Diagnosis Coding ICD-10 Codes Code Description [phone] Chronic venous hypertension (idiopathic) with ulcer and inflammation of bilateral lower extremity L97.828 Non-pressure chronic ulcer of other part of left lower leg with other specified severity L97.818 Non-pressure chronic ulcer of other part of right lower leg with other specified severity E11.51 Type 2 diabetes mellitus with diabetic peripheral angiopathy 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 She does not complain of pain. She may have been given a course of doxycycline. Past medical history is includes type 2 diabetes  with peripheral neuropathy on insulin, congestive heart failure, end-stage renal disease on hemodialysis, atrial fibrillation on Eliquis. She lives with the daughter ABI in our clinic was 0.77 on the right 0.38 monophasic on the left. The patient had arterial studies on 02/26/2022 at which time both ABIs were noncompressible TBI in the right was 0.79 on the left 0.43. Waveforms on the right were multiphasic on the left monophasic. 10/14; patient I readmitted to the clinic last week. She has wounds on her bilateral lower legs. The areas on the right are healed today she has 2 remaining open areas on the left. We used TCA Hydrofera Blue and kerlix Coban. I also think the patient probably has significant PAD. She is not all that ambulatory I was not able to elicit any claudication although according to her daughter she sleeps with her legs dangling over the side of the bed. ABIs in our clinic were 0.77 on the right 0.38 and monophasic on the left the patient had arterial studies about a year ago again with noncompressible ABIs on the left and a TBI of 0.43 on the left. 10/21; the patient was seen in follow-up last week. The areas on the right were healed and she had 2 open areas on the left. She comes back in the clinic today with the areas on the left heel but single open area in the right mid tibia. Her vascular appointment is not till sometime in December. We have been using Hydrofera Blue kerlix and Coban. She comes in today with the area on the left leg healed on the right leg with a small open area. The stockings she had were thigh-high's but they were really quite a bit too big for her. I think she is lost weight since these were prescribed [name], [name] ([id number]) [id number] Page 2 of 6 Electronic Signature(s) Signed: 03/01/2023 8:53:04 AM By: Baltazar Najjar MD Entered By: Baltazar Najjar on 02/28/2023  09:08:33 -------------------------------------------------------------------------------- Physical Exam Details Patient Name: Date of Service: [name], [name] 02/28/2023 11:00 A M Medical Record Number: [id number] Patient Account Number: [id number] Date of Birth/Sex: Treating RN: [birth year] (82 y.o. F) Primary Care Provider: Norva Riffle Other Clinician: Referring Provider: Treating Provider/Extender: Theodosia Paling in Treatment: 3 Constitutional Sitting or standing Blood Pressure is within target range for patient.. Pulse regular and within target range for patient.Marland Kitchen Respirations regular, non-labored and within target range.. Temperature is normal and within the target range for the patient.Marland Kitchen Appears in no distress. Notes Wound exam; still a small area on the right mid tibia that is not closed. Everything is closed on the left. We have good edema control peripheral pulses are palpable Electronic Signature(s) Signed: 03/01/2023 8:53:04 AM By: Baltazar Najjar MD Entered By: Baltazar Najjar on 02/28/2023 09:10:11 -------------------------------------------------------------------------------- Physician Orders Details Patient Name: Date of Service: [name], [name] 02/28/2023 11:00 A M Medical Record Number: [id number] Patient Account Number: [id number] Date of Birth/Sex: Treating RN: 07-15-1940 (82 y.o. [name] Primary Care Provider: Norva Riffle Other Clinician: Referring Provider: Treating Provider/Extender: Theodosia Paling in Treatment: 3 The following information was scribed by: Shawn Stall The information was scribed for: Baltazar Najjar Verbal / Phone Orders: No Diagnosis Coding ICD-10 Coding Code Description 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 L97.828 Non-pressure chronic ulcer of other  both legs. She does not complain of pain. She may have been given a course of doxycycline. Past medical history is includes type 2 diabetes  with peripheral neuropathy on insulin, congestive heart failure, end-stage renal disease on hemodialysis, atrial fibrillation on Eliquis. She lives with the daughter ABI in our clinic was 0.77 on the right 0.38 monophasic on the left. The patient had arterial studies on 02/26/2022 at which time both ABIs were noncompressible TBI in the right was 0.79 on the left 0.43. Waveforms on the right were multiphasic on the left monophasic. 10/14; patient I readmitted to the clinic last week. She has wounds on her bilateral lower legs. The areas on the right are healed today she has 2 remaining open areas on the left. We used TCA Hydrofera Blue and kerlix Coban. I also think the patient probably has significant PAD. She is not all that ambulatory I was not able to elicit any claudication although according to her daughter she sleeps with her legs dangling over the side of the bed. ABIs in our clinic were 0.77 on the right 0.38 and monophasic on the left the patient had arterial studies about a year ago again with noncompressible ABIs on the left and a TBI of 0.43 on the left. 10/21; the patient was seen in follow-up last week. The areas on the right were healed and she had 2 open areas on the left. She comes back in the clinic today with the areas on the left heel but single open area in the right mid tibia. Her vascular appointment is not till sometime in December. We have been using Hydrofera Blue kerlix and Coban. She comes in today with the area on the left leg healed on the right leg with a small open area. The stockings she had were thigh-high's but they were really quite a bit too big for her. I think she is lost weight since these were prescribed [name], [name] ([id number]) [id number] Page 2 of 6 Electronic Signature(s) Signed: 03/01/2023 8:53:04 AM By: Baltazar Najjar MD Entered By: Baltazar Najjar on 02/28/2023  09:08:33 -------------------------------------------------------------------------------- Physical Exam Details Patient Name: Date of Service: [name], [name] 02/28/2023 11:00 A M Medical Record Number: [id number] Patient Account Number: [id number] Date of Birth/Sex: Treating RN: [birth year] (82 y.o. F) Primary Care Provider: Norva Riffle Other Clinician: Referring Provider: Treating Provider/Extender: Theodosia Paling in Treatment: 3 Constitutional Sitting or standing Blood Pressure is within target range for patient.. Pulse regular and within target range for patient.Marland Kitchen Respirations regular, non-labored and within target range.. Temperature is normal and within the target range for the patient.Marland Kitchen Appears in no distress. Notes Wound exam; still a small area on the right mid tibia that is not closed. Everything is closed on the left. We have good edema control peripheral pulses are palpable Electronic Signature(s) Signed: 03/01/2023 8:53:04 AM By: Baltazar Najjar MD Entered By: Baltazar Najjar on 02/28/2023 09:10:11 -------------------------------------------------------------------------------- Physician Orders Details Patient Name: Date of Service: [name], [name] 02/28/2023 11:00 A M Medical Record Number: [id number] Patient Account Number: [id number] Date of Birth/Sex: Treating RN: 07-15-1940 (82 y.o. [name] Primary Care Provider: Norva Riffle Other Clinician: Referring Provider: Treating Provider/Extender: Theodosia Paling in Treatment: 3 The following information was scribed by: Shawn Stall The information was scribed for: Baltazar Najjar Verbal / Phone Orders: No Diagnosis Coding ICD-10 Coding Code Description 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 L97.828 Non-pressure chronic ulcer of other  part of left lower leg with other specified  severity L97.818 Non-pressure chronic ulcer of other part of right lower leg with other specified severity E11.51 Type 2 diabetes mellitus with diabetic peripheral angiopathy 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 - Dr. Leanord Hawking 2pm Monday room 8 03/07/2023 145pm Return A ****BRING IN COMPRESSION STOCKINGS****** Other: - Vein and Vascular appt 12/11 Bathing/ Shower/ Hygiene May shower with protection but do not get wound dressing(s) wet. Protect dressing(s) with water repellant cover (for example, large plastic bag) or a cast cover and may then take shower. Edema Control - Orders / Instructions [name], [name] ([id number]) [id number] Page 3 of 6 Elevate legs to the level of the heart or above for 30 minutes daily and/or when sitting for 3-4 times a day throughout the day. Avoid standing for long periods of time. Patient to wear own compression stockings every day. - left leg apply in the morning and remove at night. Exercise regularly Moisturize legs daily. - left leg every night before bed. Wound Treatment Wound #15 - Lower Leg Wound Laterality: Right, Anterior Cleanser: Soap and Water 1 x Per Week/30 Days Discharge Instructions: May shower and wash wound with dial antibacterial soap and water prior to dressing change. Cleanser: Vashe 5.8 (oz) 1 x Per Week/30 Days Discharge Instructions: Cleanse the wound with Vashe prior to applying a clean dressing using gauze sponges, not tissue or cotton balls. Peri-Wound Care: Triamcinolone 15 (g) 1 x Per Week/30 Days Discharge Instructions: Use triamcinolone 15 (g) as directed Peri-Wound Care: Sween Lotion (Moisturizing lotion) 1 x Per Week/30 Days Discharge Instructions: Apply moisturizing lotion as directed Prim Dressing: Hydrofera Blue Ready Transfer Foam, 2.5x2.5 (in/in) 1 x Per Week/30 Days ary Discharge Instructions: Apply directly to  wound bed as directed Secondary Dressing: ABD Pad, 8x10 1 x Per Week/30 Days Discharge Instructions: Apply over primary dressing as directed. Compression Wrap: Kerlix Roll 4.5x3.1 (in/yd) 1 x Per Week/30 Days Discharge Instructions: Apply Kerlix and Coban compression as directed. Compression Wrap: Coban Self-Adherent Wrap 4x5 (in/yd) 1 x Per Week/30 Days Discharge Instructions: Apply over Kerlix as directed. Electronic Signature(s) Signed: 02/28/2023 6:15:51 PM By: Shawn Stall RN, BSN Signed: 03/01/2023 8:53:04 AM By: Baltazar Najjar MD Entered By: Shawn Stall on 02/28/2023 08:32:43 -------------------------------------------------------------------------------- Problem List Details Patient Name: Date of Service: JARVIS, LESURE Marquez 02/28/2023 11:00 A M Medical Record Number: [id number] Patient Account Number: [id number] Date of Birth/Sex: Treating RN: [birth year] (82 y.o. [name], Millard.Loa Primary Care Provider: Norva Riffle Other Clinician: Referring Provider: Treating Provider/Extender: Theodosia Paling in Treatment: 3 Active Problems ICD-10 Encounter Code Description Active Date MDM Diagnosis I87.333 Chronic venous hypertension (idiopathic) with ulcer and inflammation of 02/07/2023 No Yes bilateral lower extremity L97.828 Non-pressure chronic ulcer of other part of left lower leg with other specified 02/07/2023 No Yes severity L97.818 Non-pressure chronic ulcer of other part of right lower leg with other specified 02/07/2023 No Yes severity E11.51 Type 2 diabetes mellitus with diabetic peripheral angiopathy without gangrene 02/07/2023 No Yes [name], KLESS ([id number]) [id number] Page 4 of 6 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 F)

## 2023-03-01 NOTE — Progress Notes (Signed)
[name], [name] ([id number]) [id number] Page 1 of 9 Visit Report for 02/28/2023 Arrival Information Details Patient Name: Date of Service: [name], [name] 02/28/2023 11:00 A M Medical Record Number: [id number] Patient Account Number: [id number] Date of Birth/Sex: Treating RN: [birth year] (82 y.o. [name] Primary Care Adreanne Yono: Norva Riffle Other Clinician: Referring Nancy Arvin: Treating Tiffane Sheldon/Extender: Theodosia Paling in Treatment: 3 Visit Information History Since Last Visit Added or deleted any medications: No Patient Arrived: Wheel Chair Any new allergies or adverse reactions: No Arrival Time: 11:07 Had a fall or experienced change in Yes Accompanied By: daughter activities of daily living that may affect Transfer Assistance: Manual risk of falls: Patient Identification Verified: Yes Signs or symptoms of abuse/neglect since last visito No Secondary Verification Process Completed: Yes Hospitalized since last visit: No Patient Requires Transmission-Based Precautions: No Implantable device outside of the clinic excluding No Patient Has Alerts: No cellular tissue based products placed in the center since last visit: Has Dressing in Place as Prescribed: Yes Has Compression in Place as Prescribed: Yes Pain Present Now: Yes Electronic Signature(s) Signed: 02/28/2023 4:03:55 PM By: Redmond Pulling RN, BSN Entered By: Redmond Pulling on 02/28/2023 08:17:03 -------------------------------------------------------------------------------- Clinic Level of Care Assessment Details Patient Name: Date of Service: [name], [name] 02/28/2023 11:00 A M Medical Record Number: [id number] Patient Account Number: [id number] Date of Birth/Sex: Treating RN: 1941/04/18 (82 y.o. [name] Primary Care Yareli Carthen: Norva Riffle Other Clinician: Referring Connelly Spruell: Treating Rosali Augello/Extender: Theodosia Paling  in Treatment: 3 Clinic Level of Care Assessment Items TOOL 4 Quantity Score X- 1 0 Use when only an EandM is performed on FOLLOW-UP visit ASSESSMENTS - Nursing Assessment / Reassessment X- 1 10 Reassessment of Co-morbidities (includes updates in patient status) X- 1 5 Reassessment of Adherence to Treatment Plan ASSESSMENTS - Wound and Skin A ssessment / Reassessment []  - 0 Simple Wound Assessment / Reassessment - one wound X- 2 5 Complex Wound Assessment / Reassessment - multiple wounds X- 1 10 Dermatologic / Skin Assessment (not related to wound area) ASSESSMENTS - Focused Assessment X- 1 5 Circumferential Edema Measurements - multi extremities []  - 0 Nutritional Assessment / Counseling / Intervention [name], [name] ([id number]) [id number] Page 2 of 9 []  - 0 Lower Extremity Assessment (monofilament, tuning fork, pulses) []  - 0 Peripheral Arterial Disease Assessment (using hand held doppler) ASSESSMENTS - Ostomy and/or Continence Assessment and Care []  - 0 Incontinence Assessment and Management []  - 0 Ostomy Care Assessment and Management (repouching, etc.) PROCESS - Coordination of Care []  - 0 Simple Patient / Family Education for ongoing care X- 1 20 Complex (extensive) Patient / Family Education for ongoing care X- 1 10 Staff obtains Chiropractor, Records, T Results / Process Orders est []  - 0 Staff telephones HHA, Nursing Homes / Clarify orders / etc []  - 0 Routine Transfer to another Facility (non-emergent condition) []  - 0 Routine Hospital Admission (non-emergent condition) []  - 0 New Admissions / Manufacturing engineer / Ordering NPWT Apligraf, etc. , []  - 0 Emergency Hospital Admission (emergent condition) []  - 0 Simple Discharge Coordination X- 1 15 Complex (extensive) Discharge Coordination PROCESS - Special Needs []  - 0 Pediatric / Minor Patient Management []  - 0 Isolation Patient Management []  - 0 Hearing / Language /  Visual special needs []  - 0 Assessment of Community assistance (transportation, D/C planning, etc.) []  - 0 Additional assistance / Altered mentation []  - 0 Support Surface(s) Assessment (bed, cushion, seat, etc.) INTERVENTIONS - Wound Cleansing /  Norva Riffle Weeks in Treatment: 3 Active Problems Location of Pain Severity and Description of Pain Patient Has Paino Yes Site Locations Rate the pain. Current Pain Level: 5 Pain Management and Medication Current Pain Management: Electronic Signature(s) Signed: 02/28/2023 4:03:55 PM By: Redmond Pulling RN, BSN Entered By: Redmond Pulling on 02/28/2023 08:18:44 [name] ([id number]) [id number] Page 7 of  9 -------------------------------------------------------------------------------- Patient/Caregiver Education Details Patient Name: Date of Service: [name], [name] 10/28/2024andnbsp11:00 A M Medical Record Number: [id number] Patient Account Number: [id number] Date of Birth/Gender: Treating RN: [birth year] (82 y.o. [name] Primary Care Physician: Norva Riffle Other Clinician: Referring Physician: Treating Physician/Extender: Theodosia Paling in Treatment: 3 Education Assessment Education Provided To: Patient Education Topics Provided Wound/Skin Impairment: Handouts: Caring for Your Ulcer Methods: Explain/Verbal Responses: Reinforcements needed Electronic Signature(s) Signed: 02/28/2023 6:15:51 PM By: Shawn Stall RN, BSN Entered By: Shawn Stall on 02/28/2023 08:31:03 -------------------------------------------------------------------------------- Wound Assessment Details Patient Name: Date of Service: [name], [name] 02/28/2023 11:00 A M Medical Record Number: [id number] Patient Account Number: [id number] Date of Birth/Sex: Treating RN: 11-Apr-1941 (82 y.o. [name] Primary Care Fantasia Jinkins: Norva Riffle Other Clinician: Referring Harshitha Fretz: Treating Jahlisa Rossitto/Extender: Theodosia Paling in Treatment: 3 Wound Status Wound Number: 15 Primary Abrasion Etiology: Wound Location: Right, Anterior Lower Leg Wound Open Wounding Event: Bump Status: Date Acquired: 02/20/2023 Comorbid Cataracts, Arrhythmia, Congestive Heart Failure, Hypertension, Weeks Of Treatment: 1 History: Type II Diabetes, End Stage Renal Disease, Neuropathy Clustered Wound: No Photos Wound Measurements [name], [name] ([id number]) Length: (cm) 0.3 Width: (cm) 0.3 Depth: (cm) 0.1 Area: (cm) 0.071 Volume: (cm) 0.007 [id number] Page 8 of 9 % Reduction in Area: 88.7% % Reduction in Volume:  88.9% Epithelialization: Large (67-100%) Tunneling: No Undermining: No Wound Description Classification: Partial Thickness Exudate Amount: Medium Exudate Type: Serosanguineous Exudate Color: red, brown Foul Odor After Cleansing: No Slough/Fibrino No Wound Bed Granulation Amount: Large (67-100%) Granulation Quality: Red Necrotic Amount: None Present (0%) Periwound Skin Texture Texture Color No Abnormalities Noted: No No Abnormalities Noted: No Callus: No Atrophie Blanche: No Crepitus: No Cyanosis: No Excoriation: No Ecchymosis: No Induration: No Erythema: No Rash: No Hemosiderin Staining: No Scarring: No Mottled: No Pallor: No Moisture Rubor: No No Abnormalities Noted: No Dry / Scaly: No Temperature / Pain Maceration: No Temperature: No Abnormality Treatment Notes Wound #15 (Lower Leg) Wound Laterality: Right, Anterior Cleanser Soap and Water Discharge Instruction: May shower and wash wound with dial antibacterial soap and water prior to dressing change. Vashe 5.8 (oz) Discharge Instruction: Cleanse the wound with Vashe prior to applying a clean dressing using gauze sponges, not tissue or cotton balls. Peri-Wound Care Triamcinolone 15 (g) Discharge Instruction: Use triamcinolone 15 (g) as directed Sween Lotion (Moisturizing lotion) Discharge Instruction: Apply moisturizing lotion as directed Topical Primary Dressing Hydrofera Blue Ready Transfer Foam, 2.5x2.5 (in/in) Discharge Instruction: Apply directly to wound bed as directed Secondary Dressing ABD Pad, 8x10 Discharge Instruction: Apply over primary dressing as directed. Secured With Compression Wrap Kerlix Roll 4.5x3.1 (in/yd) Discharge Instruction: Apply Kerlix and Coban compression as directed. Coban Self-Adherent Wrap 4x5 (in/yd) Discharge Instruction: Apply over Kerlix as directed. Compression Stockings Add-Ons Electronic Signature(s) Signed: 02/28/2023 4:03:55 PM By: Redmond Pulling RN,  BSN Entered By: Redmond Pulling on 02/28/2023 08:20:02 [name] ([id number]) [id number] Page 9 of 9 -------------------------------------------------------------------------------- Vitals Details Patient Name: Date of Service: [name], [name] 02/28/2023 11:00 A M Medical Record Number: [id number] Patient Account Number: [id number] Date of Birth/Sex: Treating RN: 1940-07-06 (82 y.o. F)  [name], [name] ([id number]) [id number] Page 1 of 9 Visit Report for 02/28/2023 Arrival Information Details Patient Name: Date of Service: [name], [name] 02/28/2023 11:00 A M Medical Record Number: [id number] Patient Account Number: [id number] Date of Birth/Sex: Treating RN: [birth year] (82 y.o. [name] Primary Care Adreanne Yono: Norva Riffle Other Clinician: Referring Nancy Arvin: Treating Tiffane Sheldon/Extender: Theodosia Paling in Treatment: 3 Visit Information History Since Last Visit Added or deleted any medications: No Patient Arrived: Wheel Chair Any new allergies or adverse reactions: No Arrival Time: 11:07 Had a fall or experienced change in Yes Accompanied By: daughter activities of daily living that may affect Transfer Assistance: Manual risk of falls: Patient Identification Verified: Yes Signs or symptoms of abuse/neglect since last visito No Secondary Verification Process Completed: Yes Hospitalized since last visit: No Patient Requires Transmission-Based Precautions: No Implantable device outside of the clinic excluding No Patient Has Alerts: No cellular tissue based products placed in the center since last visit: Has Dressing in Place as Prescribed: Yes Has Compression in Place as Prescribed: Yes Pain Present Now: Yes Electronic Signature(s) Signed: 02/28/2023 4:03:55 PM By: Redmond Pulling RN, BSN Entered By: Redmond Pulling on 02/28/2023 08:17:03 -------------------------------------------------------------------------------- Clinic Level of Care Assessment Details Patient Name: Date of Service: [name], [name] 02/28/2023 11:00 A M Medical Record Number: [id number] Patient Account Number: [id number] Date of Birth/Sex: Treating RN: 1941/04/18 (82 y.o. [name] Primary Care Yareli Carthen: Norva Riffle Other Clinician: Referring Connelly Spruell: Treating Rosali Augello/Extender: Theodosia Paling  in Treatment: 3 Clinic Level of Care Assessment Items TOOL 4 Quantity Score X- 1 0 Use when only an EandM is performed on FOLLOW-UP visit ASSESSMENTS - Nursing Assessment / Reassessment X- 1 10 Reassessment of Co-morbidities (includes updates in patient status) X- 1 5 Reassessment of Adherence to Treatment Plan ASSESSMENTS - Wound and Skin A ssessment / Reassessment []  - 0 Simple Wound Assessment / Reassessment - one wound X- 2 5 Complex Wound Assessment / Reassessment - multiple wounds X- 1 10 Dermatologic / Skin Assessment (not related to wound area) ASSESSMENTS - Focused Assessment X- 1 5 Circumferential Edema Measurements - multi extremities []  - 0 Nutritional Assessment / Counseling / Intervention [name], [name] ([id number]) [id number] Page 2 of 9 []  - 0 Lower Extremity Assessment (monofilament, tuning fork, pulses) []  - 0 Peripheral Arterial Disease Assessment (using hand held doppler) ASSESSMENTS - Ostomy and/or Continence Assessment and Care []  - 0 Incontinence Assessment and Management []  - 0 Ostomy Care Assessment and Management (repouching, etc.) PROCESS - Coordination of Care []  - 0 Simple Patient / Family Education for ongoing care X- 1 20 Complex (extensive) Patient / Family Education for ongoing care X- 1 10 Staff obtains Chiropractor, Records, T Results / Process Orders est []  - 0 Staff telephones HHA, Nursing Homes / Clarify orders / etc []  - 0 Routine Transfer to another Facility (non-emergent condition) []  - 0 Routine Hospital Admission (non-emergent condition) []  - 0 New Admissions / Manufacturing engineer / Ordering NPWT Apligraf, etc. , []  - 0 Emergency Hospital Admission (emergent condition) []  - 0 Simple Discharge Coordination X- 1 15 Complex (extensive) Discharge Coordination PROCESS - Special Needs []  - 0 Pediatric / Minor Patient Management []  - 0 Isolation Patient Management []  - 0 Hearing / Language /  Visual special needs []  - 0 Assessment of Community assistance (transportation, D/C planning, etc.) []  - 0 Additional assistance / Altered mentation []  - 0 Support Surface(s) Assessment (bed, cushion, seat, etc.) INTERVENTIONS - Wound Cleansing /  Norva Riffle Weeks in Treatment: 3 Active Problems Location of Pain Severity and Description of Pain Patient Has Paino Yes Site Locations Rate the pain. Current Pain Level: 5 Pain Management and Medication Current Pain Management: Electronic Signature(s) Signed: 02/28/2023 4:03:55 PM By: Redmond Pulling RN, BSN Entered By: Redmond Pulling on 02/28/2023 08:18:44 [name] ([id number]) [id number] Page 7 of  9 -------------------------------------------------------------------------------- Patient/Caregiver Education Details Patient Name: Date of Service: [name], [name] 10/28/2024andnbsp11:00 A M Medical Record Number: [id number] Patient Account Number: [id number] Date of Birth/Gender: Treating RN: [birth year] (82 y.o. [name] Primary Care Physician: Norva Riffle Other Clinician: Referring Physician: Treating Physician/Extender: Theodosia Paling in Treatment: 3 Education Assessment Education Provided To: Patient Education Topics Provided Wound/Skin Impairment: Handouts: Caring for Your Ulcer Methods: Explain/Verbal Responses: Reinforcements needed Electronic Signature(s) Signed: 02/28/2023 6:15:51 PM By: Shawn Stall RN, BSN Entered By: Shawn Stall on 02/28/2023 08:31:03 -------------------------------------------------------------------------------- Wound Assessment Details Patient Name: Date of Service: [name], [name] 02/28/2023 11:00 A M Medical Record Number: [id number] Patient Account Number: [id number] Date of Birth/Sex: Treating RN: 11-Apr-1941 (82 y.o. [name] Primary Care Fantasia Jinkins: Norva Riffle Other Clinician: Referring Harshitha Fretz: Treating Jahlisa Rossitto/Extender: Theodosia Paling in Treatment: 3 Wound Status Wound Number: 15 Primary Abrasion Etiology: Wound Location: Right, Anterior Lower Leg Wound Open Wounding Event: Bump Status: Date Acquired: 02/20/2023 Comorbid Cataracts, Arrhythmia, Congestive Heart Failure, Hypertension, Weeks Of Treatment: 1 History: Type II Diabetes, End Stage Renal Disease, Neuropathy Clustered Wound: No Photos Wound Measurements [name], [name] ([id number]) Length: (cm) 0.3 Width: (cm) 0.3 Depth: (cm) 0.1 Area: (cm) 0.071 Volume: (cm) 0.007 [id number] Page 8 of 9 % Reduction in Area: 88.7% % Reduction in Volume:  88.9% Epithelialization: Large (67-100%) Tunneling: No Undermining: No Wound Description Classification: Partial Thickness Exudate Amount: Medium Exudate Type: Serosanguineous Exudate Color: red, brown Foul Odor After Cleansing: No Slough/Fibrino No Wound Bed Granulation Amount: Large (67-100%) Granulation Quality: Red Necrotic Amount: None Present (0%) Periwound Skin Texture Texture Color No Abnormalities Noted: No No Abnormalities Noted: No Callus: No Atrophie Blanche: No Crepitus: No Cyanosis: No Excoriation: No Ecchymosis: No Induration: No Erythema: No Rash: No Hemosiderin Staining: No Scarring: No Mottled: No Pallor: No Moisture Rubor: No No Abnormalities Noted: No Dry / Scaly: No Temperature / Pain Maceration: No Temperature: No Abnormality Treatment Notes Wound #15 (Lower Leg) Wound Laterality: Right, Anterior Cleanser Soap and Water Discharge Instruction: May shower and wash wound with dial antibacterial soap and water prior to dressing change. Vashe 5.8 (oz) Discharge Instruction: Cleanse the wound with Vashe prior to applying a clean dressing using gauze sponges, not tissue or cotton balls. Peri-Wound Care Triamcinolone 15 (g) Discharge Instruction: Use triamcinolone 15 (g) as directed Sween Lotion (Moisturizing lotion) Discharge Instruction: Apply moisturizing lotion as directed Topical Primary Dressing Hydrofera Blue Ready Transfer Foam, 2.5x2.5 (in/in) Discharge Instruction: Apply directly to wound bed as directed Secondary Dressing ABD Pad, 8x10 Discharge Instruction: Apply over primary dressing as directed. Secured With Compression Wrap Kerlix Roll 4.5x3.1 (in/yd) Discharge Instruction: Apply Kerlix and Coban compression as directed. Coban Self-Adherent Wrap 4x5 (in/yd) Discharge Instruction: Apply over Kerlix as directed. Compression Stockings Add-Ons Electronic Signature(s) Signed: 02/28/2023 4:03:55 PM By: Redmond Pulling RN,  BSN Entered By: Redmond Pulling on 02/28/2023 08:20:02 [name] ([id number]) [id number] Page 9 of 9 -------------------------------------------------------------------------------- Vitals Details Patient Name: Date of Service: [name], [name] 02/28/2023 11:00 A M Medical Record Number: [id number] Patient Account Number: [id number] Date of Birth/Sex: Treating RN: 1940-07-06 (82 y.o. F)  160 Blood Pressure(mmHg): 119/76 Body Mass Index(BMI): 27.5 Temperature(F): 97.8 Respiratory Rate(breaths/min): 18 [15:Photos:] [N/A:N/A] Right, Anterior Lower Leg N/A N/A Wound Location: Bump N/A N/A Wounding Event: Abrasion N/A N/A Primary Etiology: Cataracts, Arrhythmia, Congestive N/A N/A Comorbid History: Heart Failure, Hypertension, Type II Diabetes, End Stage Renal Disease, Neuropathy 02/20/2023 N/A N/A Date Acquired: 1 N/A N/A Weeks of Treatment: Open N/A N/A Wound Status: No N/A N/A Wound Recurrence: 0.3x0.3x0.1 N/A N/A Measurements L x W x D (cm) 0.071 N/A N/A A (cm) : rea 0.007 N/A N/A Volume (cm) : 88.70% N/A N/A % Reduction in A rea: 88.90% N/A N/A % Reduction in Volume: Partial Thickness N/A N/A Classification: Medium N/A N/A Exudate A mount: Serosanguineous N/A N/A Exudate Type: red, brown N/A N/A Exudate Color: Large (67-100%) N/A N/A Granulation A mount: Red N/A N/A Granulation Quality: None Present (0%) N/A N/A Necrotic A mount: Large (67-100%) N/A N/A Epithelialization: Excoriation: No N/A N/A Periwound Skin Texture: Induration: No Callus: No Crepitus: No Rash: No Scarring: No [name], [name] ([id number])  [id number] Page 5 of 9 Maceration: No N/A N/A Periwound Skin Moisture: Dry/Scaly: No Atrophie Blanche: No N/A N/A Periwound Skin Color: Cyanosis: No Ecchymosis: No Erythema: No Hemosiderin Staining: No Mottled: No Pallor: No Rubor: No No Abnormality N/A N/A Temperature: Treatment Notes Wound #15 (Lower Leg) Wound Laterality: Right, Anterior Cleanser Soap and Water Discharge Instruction: May shower and wash wound with dial antibacterial soap and water prior to dressing change. Vashe 5.8 (oz) Discharge Instruction: Cleanse the wound with Vashe prior to applying a clean dressing using gauze sponges, not tissue or cotton balls. Peri-Wound Care Triamcinolone 15 (g) Discharge Instruction: Use triamcinolone 15 (g) as directed Sween Lotion (Moisturizing lotion) Discharge Instruction: Apply moisturizing lotion as directed Topical Primary Dressing Hydrofera Blue Ready Transfer Foam, 2.5x2.5 (in/in) Discharge Instruction: Apply directly to wound bed as directed Secondary Dressing ABD Pad, 8x10 Discharge Instruction: Apply over primary dressing as directed. Secured With Compression Wrap Kerlix Roll 4.5x3.1 (in/yd) Discharge Instruction: Apply Kerlix and Coban compression as directed. Coban Self-Adherent Wrap 4x5 (in/yd) Discharge Instruction: Apply over Kerlix as directed. Compression Stockings Add-Ons Electronic Signature(s) Signed: 03/01/2023 8:53:04 AM By: Baltazar Najjar MD Entered By: Baltazar Najjar on 02/28/2023 09:07:54 -------------------------------------------------------------------------------- Multi-Disciplinary Care Plan Details Patient Name: Date of Service: [name], [name] 02/28/2023 11:00 A M Medical Record Number: [id number] Patient Account Number: [id number] Date of Birth/Sex: Treating RN: 1940-09-12 (82 y.o. [name] Primary Care Rodrigo Mcgranahan: Norva Riffle Other Clinician: Referring Makhai Fulco: Treating Roark Rufo/Extender:  Theodosia Paling in Treatment: [address] [place], [name] ([id number]) [id number] Page 6 of 9 Tissue Oxygenation Nursing Diagnoses: Knowledge deficit related to disease process and management Potential alteration in peripheral tissue perfusion (select prior to confirmation of diagnosis) Goals: Non-invasive arterial studies are completed as ordered Date Initiated: 02/07/2023 Target Resolution Date: 03/11/2023 Goal Status: Active Interventions: Assess patient understanding of disease process and management upon diagnosis and as needed Assess peripheral arterial status upon admission and as needed Provide education on tissue oxygenation and ischemia Treatment Activities: Ankle Brachial Index (ABI) : 02/07/2023 Non-invasive vascular studies : 02/07/2023 Notes: Electronic Signature(s) Signed: 02/28/2023 6:15:51 PM By: Shawn Stall RN, BSN Entered By: Shawn Stall on 02/28/2023 08:30:50 -------------------------------------------------------------------------------- Pain Assessment Details Patient Name: Date of Service: [name], [name] 02/28/2023 11:00 A M Medical Record Number: [id number] Patient Account Number: [id number] Date of Birth/Sex: Treating RN: 1941-04-01 (82 y.o. [name] Primary Care Loys Hoselton: Norva Riffle Other Clinician: Referring Obrien Huskins: Treating Lauren Modisette/Extender: Baltazar Najjar

## 2023-03-07 ENCOUNTER — Encounter (HOSPITAL_BASED_OUTPATIENT_CLINIC_OR_DEPARTMENT_OTHER): Payer: Medicare HMO | Attending: Internal Medicine | Admitting: Internal Medicine

## 2023-03-07 DIAGNOSIS — L97828 Non-pressure chronic ulcer of other part of left lower leg with other specified severity: Secondary | ICD-10-CM | POA: Insufficient documentation

## 2023-03-07 DIAGNOSIS — Z09 Encounter for follow-up examination after completed treatment for conditions other than malignant neoplasm: Secondary | ICD-10-CM | POA: Insufficient documentation

## 2023-03-07 DIAGNOSIS — I87333 Chronic venous hypertension (idiopathic) with ulcer and inflammation of bilateral lower extremity: Secondary | ICD-10-CM | POA: Diagnosis present

## 2023-03-07 DIAGNOSIS — Z992 Dependence on renal dialysis: Secondary | ICD-10-CM | POA: Insufficient documentation

## 2023-03-07 DIAGNOSIS — I509 Heart failure, unspecified: Secondary | ICD-10-CM | POA: Diagnosis not present

## 2023-03-07 DIAGNOSIS — E1142 Type 2 diabetes mellitus with diabetic polyneuropathy: Secondary | ICD-10-CM | POA: Diagnosis not present

## 2023-03-07 DIAGNOSIS — L97818 Non-pressure chronic ulcer of other part of right lower leg with other specified severity: Secondary | ICD-10-CM | POA: Insufficient documentation

## 2023-03-07 DIAGNOSIS — I132 Hypertensive heart and chronic kidney disease with heart failure and with stage 5 chronic kidney disease, or end stage renal disease: Secondary | ICD-10-CM | POA: Insufficient documentation

## 2023-03-07 DIAGNOSIS — N186 End stage renal disease: Secondary | ICD-10-CM | POA: Insufficient documentation

## 2023-03-07 DIAGNOSIS — E1122 Type 2 diabetes mellitus with diabetic chronic kidney disease: Secondary | ICD-10-CM | POA: Insufficient documentation

## 2023-03-08 NOTE — Progress Notes (Signed)
MACIL, CRADY ([id number]) [id number] Page 1 of 6 Visit Report for 03/07/2023 HPI Details Patient Name: Date of Service: [name], [name] 03/07/2023 1:45 PM Medical Record Number: [id number] Patient Account Number: [id number] Date of Birth/Sex: Treating RN: July 13, 1940 (82 y.o. F) Primary Care Provider: Norva Riffle Other Clinician: Referring Provider: Treating Provider/Extender: Theodosia Paling in Treatment: 4 History of Present Illness HPI Description: ADMISSION 12/25/2021 This is an 82 year old woman with multiple medical comorbidities including congestive heart failure, hypertension, end-stage renal disease on hemodialysis, polyneuropathy, poorly controlled type 2 diabetes mellitus (last hemoglobin A1c available to me is from December 2022 and was 9.6). She presents to clinic today with multiple open ulcers on her bilateral lower extremities in the setting of severe edema. She says that she has compression stockings but has not worn them in some time because they are too tight. Her wounds are secondary to trauma from her wheelchair. She often sleeps in her wheelchair with her legs hanging down. ABIs in clinic today were noncompressible on the right and 0.57 on the left. She has multiple shallow ulcers on the bilateral lower extremities. She has 2+ pitting edema and her legs are extremely tense. They are somewhat erythematous and warm. She is currently taking cephalexin prescribed by her primary care provider due to concern for cellulitis. 01/01/2022: All of her wounds are healed. Edema control with the Kerlix and Coban wraps is excellent. She does have compression stockings at home. READMISSION 05/07/2022 The patient returns with a new wound on her right lower anterior leg. She is accompanied by her daughter. They admit that she has not been wearing her compression stockings at home. The patient's daughter reports that this wound started as  a blister that ruptured. She has been applying peroxide. Both indicate that the patient is still frequently sleeping in her wheelchair with her legs dependent. ABI in clinic today was 1.08. The wound is small, just medial to the anterior tibial surface, clean. 06/07/2022: The patient has failed to show up for multiple clinic visits. The patient's daughter says that she simply refuses to come when its time for her appointments. She continues to be quite stubborn about sleeping in her wheelchair with her legs dependent. The wound on her right anterior tibial surface is shallow and just has a bit of eschar buildup. 06/14/2022: She has a new wound on her left anterior tibial surface. It has some slough accumulation. The wound on her right leg has slough and eschar buildup. Edema control is much better this week. 06/21/2022: The wound on her right leg is healed. The wound on her left anterior tibial surface is superficial with just a layer of eschar on the surface. Edema control is very good bilaterally. 06/30/2022: Her left leg has healed. She managed to scrape her anterior tibial surface on the right and she has open abrasions extending into the fat layer. 07/09/2022: The right anterior tibial surface wounds have eschar and adherent silver alginate present. No concern for infection. ADMISSION 02/07/2023 This is a now 82 year old woman who arrives in clinic on her own. She has had previous visits to this clinic in 2023 and more recently earlier this year at which time she had wounds in the right leg with chronic venous insufficiency which she was discharged with stockings. Notably low ABI. 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 The patient has apparently had blisters on her legs for 1 month and they eventually opened up into wounds this is on both  legs. She does not complain of pain. She may have been given a course of doxycycline. 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 She lives with the daughter ABI in our clinic was 0.77 on the right 0.38 monophasic on the left. The patient had arterial studies on 02/26/2022 at which time both ABIs were noncompressible TBI in the right was 0.79 on the left 0.43. Waveforms on the right were multiphasic on the left monophasic. 10/14; patient I readmitted to the clinic last week. She has wounds on her bilateral lower legs. The areas on the right are healed today she has 2 remaining open areas on the left. We used TCA Hydrofera Blue and kerlix Coban. I also think the patient probably has significant PAD. She is not all that ambulatory I was not able to elicit any claudication although according to her daughter she sleeps with her legs dangling over the side of the bed. ABIs in our clinic were 0.77 on the right 0.38 and monophasic on the left the patient had arterial studies about a year ago again with noncompressible ABIs on the left and a TBI of 0.43 on the left. 10/21; the patient was seen in follow-up last week. The areas on the right were healed and she had 2 open areas on the left. She comes back in the clinic today with the areas on the left heel but single open area in the right mid tibia. Her vascular appointment is not till sometime in December. We have been using Hydrofera Blue kerlix and Coban. She comes in today with the area on the left leg healed on the right leg with a small open area. The stockings she had were thigh-high's but they were really quite a bit too big for her. I think she is lost weight since these were prescribed 11/4; patient's wounds on the right leg are closed. She comes into our clinic in a stocking on the left which are 20/30's [name], [name] ([id number]) [id number] Page 2 of 6 Electronic Signature(s) Signed: 03/07/2023 4:50:34 PM By:  Baltazar Najjar MD Entered By: Baltazar Najjar on 03/07/2023 13:58:19 -------------------------------------------------------------------------------- Physical Exam Details Patient Name: Date of Service: [name], [name] 03/07/2023 1:45 PM Medical Record Number: [id number] Patient Account Number: [id number] Date of Birth/Sex: Treating RN: 1941-02-15 (82 y.o. F) Primary Care Provider: Norva Riffle Other Clinician: Referring Provider: Treating Provider/Extender: Theodosia Paling in Treatment: 4 Constitutional Sitting or standing Blood Pressure is within target range for patient.. Pulse regular and within target range for patient.Marland Kitchen Respirations regular, non-labored and within target range.. Temperature is normal and within the target range for the patient.Marland Kitchen Appears in no distress. Notes 1. The area on the mid tibia is closed. She had wounds on the left leg there close she has a stocking on. Skin changes of chronic venous insufficiency. Her edema is under very good control Electronic Signature(s) Signed: 03/07/2023 4:50:34 PM By: Baltazar Najjar MD Entered By: Baltazar Najjar on 03/07/2023 13:59:33 -------------------------------------------------------------------------------- Physician Orders Details Patient Name: Date of Service: [name], [name] 03/07/2023 1:45 PM Medical Record Number: [id number] Patient Account Number: [id number] Date of Birth/Sex: Treating RN: Jan 06, 1941 (82 y.o. [name] Primary Care Provider: Norva Riffle Other Clinician: Referring Provider: Treating Provider/Extender: Theodosia Paling in Treatment: 4 The following information was scribed by: Shawn Stall The information was scribed for: Baltazar Najjar Verbal / Phone Orders:  No Diagnosis Coding ICD-10 Coding Code Description 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 L97.828 Non-pressure  chronic ulcer of other part of left lower leg with other specified severity L97.818 Non-pressure chronic ulcer of other part of right lower leg with other specified severity E11.51 Type 2 diabetes mellitus with diabetic peripheral angiopathy 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 Replace compression stockings every 6 months. Wear compression stockings for life. Edema Control - Orders / Instructions Elevate legs to the level of the heart or above for 30 minutes daily and/or when sitting for 3-4 times a day throughout the day. Avoid standing for long periods of time. Patient to wear own compression stockings every day. [name], [name] ([id number]) [id number] Page 3 of 6 Exercise regularly Moisturize legs daily. - every night before bed. Electronic Signature(s) Signed: 03/07/2023 4:50:34 PM By: Baltazar Najjar MD Signed: 03/08/2023 12:20:53 PM By: Shawn Stall RN, BSN Entered By: Shawn Stall on 03/07/2023 13:54:46 -------------------------------------------------------------------------------- Problem List Details Patient Name: Date of Service: [name], [name] 03/07/2023 1:45 PM Medical Record Number: [id number] Patient Account Number: [id number] Date of Birth/Sex: Treating RN: 04-24-1941 (82 y.o. [name], Millard.Loa Primary Care Provider: Norva Riffle Other Clinician: Referring Provider: Treating Provider/Extender: Theodosia Paling in Treatment: 4 Active Problems ICD-10 Encounter Code Description Active Date MDM Diagnosis I87.333 Chronic venous hypertension (idiopathic) with ulcer and inflammation of 02/07/2023 No Yes bilateral lower extremity L97.828 Non-pressure chronic ulcer of other part of left lower leg with other specified 02/07/2023 No Yes severity L97.818 Non-pressure chronic ulcer of other  part of right lower leg with other specified 02/07/2023 No Yes severity E11.51 Type 2 diabetes mellitus with diabetic peripheral angiopathy without gangrene 02/07/2023 No Yes 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 F) Primary Care Provider: Other Clinician: Norva Riffle Referring Provider: Treating Provider/Extender: Theodosia Paling in Treatment: 4 Subjective History of Present Illness (HPI) ADMISSION 12/25/2021 This is an 82 year old woman with multiple medical comorbidities including congestive heart failure, hypertension, end-stage renal disease on hemodialysis, polyneuropathy, poorly controlled type 2 diabetes mellitus (last hemoglobin A1c available to me is from December 2022 and was 9.6). She presents to clinic today with multiple open ulcers on her bilateral lower extremities in the setting of severe edema. She says that she has compression stockings but has not worn them in some time because they are too tight. Her wounds are secondary to trauma from her wheelchair. She often sleeps in her wheelchair with her legs hanging down. ABIs in clinic today were noncompressible on the right and 0.57 on the left. She has multiple shallow ulcers on the bilateral lower extremities. She has 2+ pitting edema and her legs are extremely tense. They are somewhat erythematous and warm. She is currently taking  cephalexin prescribed by her primary care provider due to concern for cellulitis. 01/01/2022: All of her wounds are healed. Edema control with the Kerlix and Coban wraps is excellent. She does have compression stockings at home.  READMISSION 05/07/2022 The patient returns with a new wound on her right lower anterior leg. She is accompanied by her daughter. They admit that she has not been wearing her compression stockings at home. The patient's daughter reports that this wound started as a blister that ruptured. She has been applying peroxide. Both indicate that the patient is still frequently sleeping in her wheelchair with her legs dependent. ABI in clinic today was 1.08. The wound is small, just medial to the anterior tibial surface, clean. 06/07/2022: The patient has failed to show up for multiple clinic visits. The patient's daughter says that she simply refuses to come when its time for her appointments. She continues to be quite stubborn about sleeping in her wheelchair with her legs dependent. The wound on her right anterior tibial surface is shallow and just has a bit of eschar buildup. 06/14/2022: She has a new wound on her left anterior tibial surface. It has some slough accumulation. The wound on her right leg has slough and eschar buildup. Edema control is much better this week. 06/21/2022: The wound on her right leg is healed. The wound on her left anterior tibial surface is superficial with just a layer of eschar on the surface. Edema control is very good bilaterally. 06/30/2022: Her left leg has healed. She managed to scrape her anterior tibial surface on the right and she has open abrasions extending into the fat layer. 07/09/2022: The right anterior tibial surface wounds have eschar and adherent silver alginate present. No concern for infection. ADMISSION 02/07/2023 This is a now 82 year old woman who arrives in clinic on her own. She has had previous visits to this clinic in 2023 and more  recently earlier this year at which time she had wounds in the right leg with chronic venous insufficiency which she was discharged with stockings. Notably low ABI. 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 The patient has apparently had blisters on her legs for 1 month and they eventually opened up into wounds this is on both legs. She does not complain of pain. She may have been given a course of doxycycline. Past medical history is includes type 2 diabetes with peripheral neuropathy on insulin, congestive heart failure, end-stage renal disease on hemodialysis, atrial fibrillation on Eliquis. She lives with the daughter ABI in our clinic was 0.77 on the right 0.38 monophasic on the left. The patient had arterial studies on 02/26/2022 at which time both ABIs were noncompressible TBI in the right was 0.79 on the left 0.43. Waveforms on the right were multiphasic on the left monophasic. 10/14; patient I readmitted to the clinic last week. She has wounds on her bilateral lower legs. The areas on the right are healed today she has 2 remaining open areas on the left. We used TCA Hydrofera Blue and kerlix Coban. I also think the patient probably has significant PAD. She is not all that ambulatory I was not able to elicit any claudication although according to her daughter she sleeps with her legs dangling over the side of the bed. ABIs in our clinic were 0.77 on the right 0.38 and monophasic on the left the patient had arterial studies about a year ago again with noncompressible ABIs on the left and a TBI of 0.43 on the left. 10/21; the patient was seen in follow-up last week. The areas on the right were healed and she had 2 open areas on the left. She comes back in the clinic today with the areas  on the left heel but single open area in the right mid tibia. Her vascular appointment is not till sometime in December. We have been using Hydrofera Blue kerlix and Coban. She comes in today  with the area on the left leg healed on the right leg with a small open area. The stockings she had were thigh-high's but they were really quite a bit too big for her. I think she is lost weight since these were prescribed 11/4; patient's wounds on the right leg are closed. She comes into our clinic in a stocking on the left which are 20/30's Objective Constitutional Sitting or standing Blood Pressure is within target range for patient.. Pulse regular and within target range for patient.Marland Kitchen Respirations regular, non-labored and within target range.. Temperature is normal and within the target range for the patient.Marland Kitchen Appears in no distress. Vitals Time Taken: 1:33 PM, Height: 64 in, Weight: 160 lbs, BMI: 27.5, Temperature: 98.4 F, Pulse: 82 bpm, Respiratory Rate: 18 breaths/min, Blood Pressure: [name], [name] ([id number]) [id number] Page 5 of 6 124/70 mmHg. General Notes: 1. The area on the mid tibia is closed. She had wounds on the left leg there close she has a stocking on. Skin changes of chronic venous insufficiency. Her edema is under very good control Integumentary (Hair, Skin) Wound #15 status is Healed - Epithelialized. Original cause of wound was Bump. The date acquired was: 02/20/2023. The wound has been in treatment 2 weeks. The wound is located on the Right,Anterior Lower Leg. The wound measures 0cm length x 0cm width x 0cm depth; 0cm^2 area and 0cm^3 volume. There is no tunneling or undermining noted. There is a medium amount of serosanguineous drainage noted. There is no granulation within the wound bed. There is a large (67-100%) amount of necrotic tissue within the wound bed. The periwound skin appearance did not exhibit: Callus, Crepitus, Excoriation, Induration, Rash, Scarring, Dry/Scaly, Maceration, Atrophie Blanche, Cyanosis, Ecchymosis, Hemosiderin Staining, Mottled, Pallor, Rubor, Erythema. Periwound temperature was noted as No  Abnormality. 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 Replace compression stockings every 6 months. Wear compression stockings for life. Edema Control - Orders / Instructions: Elevate legs to the level of the heart or above for 30 minutes daily and/or when sitting for 3-4 times a day throughout the day. Avoid standing for long periods of time. Patient to wear own compression stockings every day. Exercise regularly Moisturize legs daily. - every night before bed. 1. The patient to be discharged from the clinic 2. Lifelong 20/30 below-knee stockings we went over this with her. 3. They have a skin moisturizer Cetaphil Electronic Signature(s) Signed: 03/07/2023 4:50:34 PM By: Baltazar Najjar MD Entered By: Baltazar Najjar on 03/07/2023 14:00:14 -------------------------------------------------------------------------------- SuperBill Details Patient Name: Date of Service: [name], [name] 03/07/2023 Medical Record Number: [id number] Patient Account Number: [id number] Date of Birth/Sex: Treating RN: 28-Nov-1940 (82 y.o. 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 2 diabetes mellitus with diabetic peripheral angiopathy 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 Facility Procedures : CPT4 Code: [id number] Description: 99213 - WOUND CARE VISIT-LEV 3 EST PT Modifier: Quantity: 1 Physician Procedures : CPT4 Code Description Modifier [id number] 16010 - WC PHYS LEVEL 2 - EST PT ICD-10 Diagnosis Description L97.818 Non-pressure chronic ulcer of other part of right lower leg with other specified severity L97.828 Non-pressure chronic ulcer of other part of  left lower leg with other specified severity 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

## 2023-03-11 ENCOUNTER — Telehealth: Payer: Self-pay

## 2023-03-11 NOTE — Progress Notes (Signed)
[name] [name] Marquez, [name] [name] Marquez ([id number]) [id number] Page 1 of 8 Visit Report for 03/07/2023 Arrival Information Details Patient Name: Date of Service: [name], [name] Marquez [name] 03/07/2023 1:45 PM Medical Record Number: [id number] Patient Account Number: [id number] Date of Birth/Sex: Treating Marquez: 1940/09/22 (82 y.o. F) Primary Care [name] [name] Marquez: [name] [name] Marquez Other Clinician: Referring [name] [name] Marquez: Treating [name] [name] Marquez/Extender: [name] [name] Marquez in Treatment: 4 Visit Information History Since Last Visit Added or deleted any medications: No Patient Arrived: Wheel Chair Any new allergies or adverse reactions: No Arrival Time: 13:30 Had a fall or experienced change in No Accompanied By: daughter activities of daily living that may affect Transfer Assistance: None risk of falls: Patient Requires Transmission-Based No Signs or symptoms of abuse/neglect since last visito No Precautions: Hospitalized since last visit: No Patient Has Alerts: Yes Implantable device outside of the clinic excluding No Patient Alerts: DO NOT USE RIGHT cellular tissue based products placed in the center ARM since last visit: Has Dressing in Place as Prescribed: Yes Has Compression in Place as Prescribed: Yes Pain Present Now: No Electronic Signature(s) Signed: 03/11/2023 12:52:25 PM By: Thayer Dallas Entered By: Thayer Dallas on 03/07/2023 10:42:17 -------------------------------------------------------------------------------- Clinic Level of Care Assessment Details Patient Name: Date of Service: [name], [name] Marquez [name] 03/07/2023 1:45 PM Medical Record Number: [id number] Patient Account Number: [id number] Date of Birth/Sex: Treating Marquez: 1941-01-24 (82 y.o. [name] Primary Care Korrin Waterfield: [name] [name] Marquez Other Clinician: Referring Bassy Fetterly: Treating Monaca Wadas/Extender: [name] [name] Marquez in Treatment: 4 Clinic Level of Care Assessment Items TOOL 4 Quantity  Score X- 1 0 Use when only an EandM is performed on FOLLOW-UP visit ASSESSMENTS - Nursing Assessment / Reassessment X- 1 10 Reassessment of Co-morbidities (includes updates in patient status) X- 1 5 Reassessment of Adherence to Treatment Plan ASSESSMENTS - Wound and Skin A ssessment / Reassessment X - Simple Wound Assessment / Reassessment - one wound 1 5 []  - 0 Complex Wound Assessment / Reassessment - multiple wounds X- 1 10 Dermatologic / Skin Assessment (not related to wound area) ASSESSMENTS - Focused Assessment X- 1 5 Circumferential Edema Measurements - multi extremities []  - 0 Nutritional Assessment / Counseling / Intervention [name] [name] Marquez, [name] [name] Marquez ([id number]) [id number] Page 2 of 8 []  - 0 Lower Extremity Assessment (monofilament, tuning fork, pulses) []  - 0 Peripheral Arterial Disease Assessment (using hand held doppler) ASSESSMENTS - Ostomy and/or Continence Assessment and Care []  - 0 Incontinence Assessment and Management []  - 0 Ostomy Care Assessment and Management (repouching, etc.) PROCESS - Coordination of Care X - Simple Patient / Family Education for ongoing care 1 15 []  - 0 Complex (extensive) Patient / Family Education for ongoing care X- 1 10 Staff obtains Chiropractor, Records, T Results / Process Orders est []  - 0 Staff telephones HHA, Nursing Homes / Clarify orders / etc []  - 0 Routine Transfer to another Facility (non-emergent condition) []  - 0 Routine Hospital Admission (non-emergent condition) []  - 0 New Admissions / Manufacturing engineer / Ordering NPWT Apligraf, etc. , []  - 0 Emergency Hospital Admission (emergent condition) X- 1 10 Simple Discharge Coordination []  - 0 Complex (extensive) Discharge Coordination PROCESS - Special Needs []  - 0 Pediatric / Minor Patient Management []  - 0 Isolation Patient Management []  - 0 Hearing / Language / Visual special needs []  - 0 Assessment of Community assistance  (transportation, D/C planning, etc.) []  - 0 Additional assistance / Altered mentation []  - 0 Support Surface(s) Assessment (bed, cushion, seat, etc.) INTERVENTIONS - Wound Cleansing / Measurement X - Simple Wound Cleansing -  one wound 1 5 []  - 0 Complex Wound Cleansing - multiple wounds X- 1 5 Wound Imaging (photographs - any number of wounds) []  - 0 Wound Tracing (instead of photographs) X- 1 5 Simple Wound Measurement - one wound []  - 0 Complex Wound Measurement - multiple wounds INTERVENTIONS - Wound Dressings []  - 0 Small Wound Dressing one or multiple wounds []  - 0 Medium Wound Dressing one or multiple wounds []  - 0 Large Wound Dressing one or multiple wounds []  - 0 Application of Medications - topical []  - 0 Application of Medications - injection INTERVENTIONS - Miscellaneous []  - 0 External ear exam []  - 0 Specimen Collection (cultures, biopsies, blood, body fluids, etc.) []  - 0 Specimen(s) / Culture(s) sent or taken to Lab for analysis []  - 0 Patient Transfer (multiple staff / Nurse, adult / Similar devices) []  - 0 Simple Staple / Suture removal (25 or less) []  - 0 Complex Staple / Suture removal (26 or more) []  - 0 Hypo / Hyperglycemic Management (close monitor of Blood Glucose) [name] ([id number]) [id number] Page 3 of 8 []  - 0 Ankle / Brachial Index (ABI) - do not check if billed separately X- 1 5 Vital Signs Has the patient been seen at the hospital within the last three years: Yes Total Score: 90 Level Of Care: New/Established - Level 3 Electronic Signature(s) Signed: 03/08/2023 12:20:53 PM By: [name] [name] Marquez, BSN Entered By: Terry Stall on 03/07/2023 10:55:30 -------------------------------------------------------------------------------- Encounter Discharge Information Details Patient Name: Date of Service: [name] [name] Marquez, [name] [name] 03/07/2023 1:45 PM Medical Record Number: [id number] Patient Account Number:  [id number] Date of Birth/Sex: Treating Marquez: [birth year] (82 y.o. [name] [name] Marquez, [name] [name] Marquez Primary Care Kealy Lewter: [name] [name] Marquez Other Clinician: Referring Duanna Runk: Treating Betsy Rosello/Extender: [name] [name] Marquez in Treatment: 4 Encounter Discharge Information Items Discharge Condition: Stable Ambulatory Status: Wheelchair Discharge Destination: Home Transportation: Private Auto Accompanied By: daughter Schedule Follow-up Appointment: No Clinical Summary of Care: Electronic Signature(s) Signed: 03/08/2023 12:20:53 PM By: [name] [name] Marquez, BSN Entered By: Terry Stall on 03/07/2023 11:00:22 -------------------------------------------------------------------------------- Lower Extremity Assessment Details Patient Name: Date of Service: [name], [name] Marquez [name] 03/07/2023 1:45 PM Medical Record Number: [id number] Patient Account Number: [id number] Date of Birth/Sex: Treating Marquez: May 31, 1940 (82 y.o. F) Primary Care Anicia Leuthold: [name] [name] Marquez Other Clinician: Referring Keyle Doby: Treating Tavius Turgeon/Extender: [name] [name] Marquez in Treatment: 4 Edema Assessment Assessed: Kyra Searles: No] Franne Forts: No] Edema: [Left: No] [Right: No] Calf Left: Right: Point of Measurement: 31 cm From Medial Instep 28.1 cm 28 cm Ankle Left: Right: Point of Measurement: 10 cm From Medial Instep 19.8 cm 18.5 cm Vascular Assessment Left: [[id number] Page 4 of 8Right:] Extremity colors, hair growth, and conditions: Extremity Color: [phone].pdf Page 4 of 8Hyperpigmented Hyperpigmented] Hair Growth on Extremity: [phone].pdf Page 4 of 8No No] Temperature of Extremity: [phone].pdf Page 4 of 8Warm Warm] Capillary Refill: [phone].pdf Page 4 of 8> 3 seconds > 3 seconds] Dependent Rubor: [[id number].pdf Page 4 of 8No No Yes Yes] Electronic  Signature(s) Signed: 03/11/2023 12:52:25 PM By: Thayer Dallas Entered By: Thayer Dallas on 03/07/2023 10:40:59 -------------------------------------------------------------------------------- Multi Wound Chart Details Patient Name: Date of Service: [name], [name] Marquez [name] 03/07/2023 1:45 PM Medical Record Number: [id number] Patient Account Number: [id number] Date of Birth/Sex: Treating Marquez: April 07, 1941 (82 y.o. F) Primary Care Yan Okray: [name] [name] Marquez Other Clinician: Referring Tessah Patchen: Treating Ervine Witucki/Extender: [name] [name] Marquez in Treatment: 4 Vital Signs Height(in): 64 Pulse(bpm): 82 Weight(lbs): 160 Blood Pressure(mmHg): 124/70 Body Mass Index(BMI): 27.5 Temperature(F): 98.4 Respiratory Rate(breaths/min): 18 [15:Photos:] [  N/A:N/A] Right, Anterior Lower Leg N/A N/A Wound Location: Bump N/A N/A Wounding Event: Abrasion N/A N/A Primary Etiology: Cataracts, Arrhythmia, Congestive N/A N/A Comorbid History: Heart Failure, Hypertension, Type II Diabetes, End Stage Renal Disease, Neuropathy 02/20/2023 N/A N/A Date Acquired: 2 N/A N/A Weeks of Treatment: Healed - Epithelialized N/A N/A Wound Status: No N/A N/A Wound Recurrence: 0x0x0 N/A N/A Measurements L x W x D (cm) 0 N/A N/A A (cm) : rea 0 N/A N/A Volume (cm) : 100.00% N/A N/A % Reduction in A rea: 100.00% N/A N/A % Reduction in Volume: Partial Thickness N/A N/A Classification: Medium N/A N/A Exudate A mount: Serosanguineous N/A N/A Exudate Type: red, brown N/A N/A Exudate Color: None Present (0%) N/A N/A Granulation A mount: Large (67-100%) N/A N/A Necrotic A mount: Fascia: No N/A N/A Exposed Structures: Fat Layer (Subcutaneous Tissue): No Tendon: No Muscle: No Joint: No Bone: No Large (67-100%) N/A N/A Epithelialization: Excoriation: No N/A N/A Periwound Skin TextureALAYAH, KITTEL ([id number]) [id number] Page 5 of 8 Induration: No Callus:  No Crepitus: No Rash: No Scarring: No Maceration: No N/A N/A Periwound Skin Moisture: Dry/Scaly: No Atrophie Blanche: No N/A N/A Periwound Skin Color: Cyanosis: No Ecchymosis: No Erythema: No Hemosiderin Staining: No Mottled: No Pallor: No Rubor: No No Abnormality N/A N/A Temperature: Treatment Notes Electronic Signature(s) Signed: 03/07/2023 4:50:34 PM By: Baltazar Najjar MD Entered By: Baltazar Najjar on 03/07/2023 10:57:22 -------------------------------------------------------------------------------- Multi-Disciplinary Care Plan Details Patient Name: Date of Service: [name], [name] Marquez [name] 03/07/2023 1:45 PM Medical Record Number: [id number] Patient Account Number: [id number] Date of Birth/Sex: Treating Marquez: 01/01/1941 (82 y.o. [name] Primary Care Bethzy Hauck: [name] [name] Marquez Other Clinician: Referring Farren Landa: Treating Beya Tipps/Extender: [name] [name] Marquez in Treatment: 4 Active Inactive Electronic Signature(s) Signed: 03/08/2023 12:20:53 PM By: [name] [name] Marquez, BSN Entered By: Terry Stall on 03/07/2023 10:50:22 -------------------------------------------------------------------------------- Pain Assessment Details Patient Name: Date of Service: [name], [name] Marquez [name] 03/07/2023 1:45 PM Medical Record Number: [id number] Patient Account Number: [id number] Date of Birth/Sex: Treating Marquez: [birth year] (82 y.o. F) Primary Care Dasja Brase: [name] [name] Marquez Other Clinician: Referring Trista Ciocca: Treating Aryaan Persichetti/Extender: [name] [name] Marquez in Treatment: 4 Active Problems Location of Pain Severity and Description of Pain Patient Has Paino No Site Locations [place], [place] ([id number]) [id number] Page 6 of 8 Pain Management and Medication Current Pain Management: Electronic Signature(s) Signed: 03/11/2023 12:52:25 PM By: Thayer Dallas Entered By: Thayer Dallas on 03/07/2023  10:33:25 -------------------------------------------------------------------------------- Patient/Caregiver Education Details Patient Name: Date of Service: [name], [name] Marquez [name] 11/4/2024andnbsp1:45 PM Medical Record Number: [id number] Patient Account Number: [id number] Date of Birth/Gender: Treating Marquez: 02/23/1941 (82 y.o. [name] Primary Care Physician: [name] [name] Marquez Other Clinician: Referring Physician: Treating Physician/Extender: [name] [name] Marquez in Treatment: 4 Education Assessment Education Provided To: Patient Education Topics Provided Wound/Skin Impairment: Handouts: Caring for Your Ulcer Methods: Explain/Verbal Responses: Reinforcements needed Electronic Signature(s) Signed: 03/08/2023 12:20:53 PM By: [name] [name] Marquez, BSN Entered By: Terry Stall on 03/07/2023 10:50:52 -------------------------------------------------------------------------------- Wound Assessment Details Patient Name: Date of Service: [name], [name] Marquez [name] 03/07/2023 1:45 PM Medical Record Number: [id number] Patient Account Number: [id number] Date of Birth/Sex: Treating Marquez: 1940-08-30 (82 y.o. [name] Primary Care Ayesha Markwell: [name] [name] Marquez Other Clinician: [name] ([id number]) [id number] Page 7 of 8 Referring Tomasita Beevers: Treating Emaline Karnes/Extender: [name] [name] Marquez in Treatment: 4 Wound Status Wound Number: 15 Primary Abrasion Etiology: Wound Location: Right, Anterior Lower Leg Wound Healed - Epithelialized Wounding Event: Bump Status: Date Acquired: 02/20/2023 Comorbid Cataracts, Arrhythmia, Congestive Heart  Failure, Hypertension, Weeks Of Treatment: 2 History: Type II Diabetes, End Stage Renal Disease, Neuropathy Clustered Wound: No Photos Wound Measurements Length: (cm) Width: (cm) Depth: (cm) Area: (cm) Volume: (cm) 0 % Reduction in Area: 100% 0 % Reduction in Volume: 100% 0 Epithelialization:  Large (67-100%) 0 Tunneling: No 0 Undermining: No Wound Description Classification: Partial Thickness Exudate Amount: Medium Exudate Type: Serosanguineous Exudate Color: red, brown Foul Odor After Cleansing: No Slough/Fibrino No Wound Bed Granulation Amount: None Present (0%) Exposed Structure Necrotic Amount: Large (67-100%) Fascia Exposed: No Fat Layer (Subcutaneous Tissue) Exposed: No Tendon Exposed: No Muscle Exposed: No Joint Exposed: No Bone Exposed: No Periwound Skin Texture Texture Color No Abnormalities Noted: No No Abnormalities Noted: No Callus: No Atrophie Blanche: No Crepitus: No Cyanosis: No Excoriation: No Ecchymosis: No Induration: No Erythema: No Rash: No Hemosiderin Staining: No Scarring: No Mottled: No Pallor: No Moisture Rubor: No No Abnormalities Noted: No Dry / Scaly: No Temperature / Pain Maceration: No Temperature: No Abnormality Treatment Notes Wound #15 (Lower Leg) Wound Laterality: Right, Anterior Cleanser Peri-Wound Care Topical Primary Dressing Secondary Dressing [name], [name] Marquez ([id number]) [id number] Page 8 of 8 Secured With Compression Wrap Compression Stockings Add-Ons Notes applied compression stockings on. Electronic Signature(s) Signed: 03/08/2023 12:20:53 PM By: [name] [name] Marquez, BSN Entered By: Terry Stall on 03/07/2023 10:53:40 -------------------------------------------------------------------------------- Vitals Details Patient Name: Date of Service: [name], [name] Marquez [name] 03/07/2023 1:45 PM Medical Record Number: [id number] Patient Account Number: [id number] Date of Birth/Sex: Treating Marquez: 11-16-1940 (82 y.o. F) Primary Care Chessie Neuharth: [name] [name] Marquez Other Clinician: Referring Mayetta Castleman: Treating Lawrence Mitch/Extender: [name] [name] Marquez in Treatment: 4 Vital Signs Time Taken: 13:33 Temperature (F): 98.4 Height (in): 64 Pulse (bpm): 82 Weight (lbs): 160 Respiratory  Rate (breaths/min): 18 Body Mass Index (BMI): 27.5 Blood Pressure (mmHg): 124/70 Reference Range: 80 - 120 mg / dl Electronic Signature(s) Signed: 03/11/2023 12:52:25 PM By: Thayer Dallas Entered By: Thayer Dallas on 03/07/2023 10:33:17

## 2023-03-11 NOTE — Telephone Encounter (Signed)
Second attempt to reach pt to schedule her procedure. VM is full-unable to leave message.

## 2023-03-24 ENCOUNTER — Other Ambulatory Visit: Payer: Self-pay | Admitting: *Deleted

## 2023-03-24 ENCOUNTER — Encounter: Payer: Self-pay | Admitting: *Deleted

## 2023-03-24 DIAGNOSIS — N186 End stage renal disease: Secondary | ICD-10-CM

## 2023-03-24 MED ORDER — SODIUM CHLORIDE 0.9 % IV SOLN: 250.0000 mL | INTRAVENOUS | Status: AC | PRN

## 2023-03-24 MED ORDER — SODIUM CHLORIDE 0.9% FLUSH: 3.0000 mL | INTRAVENOUS | Status: DC | PRN

## 2023-03-24 MED ORDER — SODIUM CHLORIDE 0.9% FLUSH: 3.0000 mL | Freq: Two times a day (BID) | INTRAVENOUS | Status: DC

## 2023-03-28 ENCOUNTER — Other Ambulatory Visit: Payer: Self-pay | Admitting: *Deleted

## 2023-03-28 DIAGNOSIS — I872 Venous insufficiency (chronic) (peripheral): Secondary | ICD-10-CM

## 2023-03-31 ENCOUNTER — Inpatient Hospital Stay (HOSPITAL_COMMUNITY): Payer: Medicare HMO

## 2023-03-31 ENCOUNTER — Encounter (HOSPITAL_COMMUNITY): Payer: Self-pay

## 2023-03-31 ENCOUNTER — Other Ambulatory Visit: Payer: Self-pay

## 2023-03-31 ENCOUNTER — Inpatient Hospital Stay (HOSPITAL_COMMUNITY)
Admission: EM | Admit: 2023-03-31 | Discharge: 2023-04-02 | DRG: 100 | Disposition: A | Discharge status: Home Health | Payer: Medicare HMO | Attending: Internal Medicine | Admitting: Internal Medicine

## 2023-03-31 ENCOUNTER — Emergency Department (HOSPITAL_COMMUNITY): Payer: Medicare HMO

## 2023-03-31 DIAGNOSIS — E1122 Type 2 diabetes mellitus with diabetic chronic kidney disease: Secondary | ICD-10-CM | POA: Diagnosis present

## 2023-03-31 DIAGNOSIS — D631 Anemia in chronic kidney disease: Secondary | ICD-10-CM | POA: Diagnosis present

## 2023-03-31 DIAGNOSIS — N183 Chronic kidney disease, stage 3 unspecified: Secondary | ICD-10-CM

## 2023-03-31 DIAGNOSIS — E785 Hyperlipidemia, unspecified: Secondary | ICD-10-CM | POA: Diagnosis present

## 2023-03-31 DIAGNOSIS — G8384 Todd's paralysis (postepileptic): Secondary | ICD-10-CM | POA: Diagnosis present

## 2023-03-31 DIAGNOSIS — I480 Paroxysmal atrial fibrillation: Secondary | ICD-10-CM | POA: Diagnosis present

## 2023-03-31 DIAGNOSIS — I10 Essential (primary) hypertension: Secondary | ICD-10-CM | POA: Diagnosis not present

## 2023-03-31 DIAGNOSIS — R414 Neurologic neglect syndrome: Secondary | ICD-10-CM | POA: Diagnosis present

## 2023-03-31 DIAGNOSIS — I272 Pulmonary hypertension, unspecified: Secondary | ICD-10-CM | POA: Diagnosis present

## 2023-03-31 DIAGNOSIS — Z888 Allergy status to other drugs, medicaments and biological substances status: Secondary | ICD-10-CM

## 2023-03-31 DIAGNOSIS — Z7984 Long term (current) use of oral hypoglycemic drugs: Secondary | ICD-10-CM | POA: Diagnosis not present

## 2023-03-31 DIAGNOSIS — I120 Hypertensive chronic kidney disease with stage 5 chronic kidney disease or end stage renal disease: Secondary | ICD-10-CM | POA: Diagnosis present

## 2023-03-31 DIAGNOSIS — N2581 Secondary hyperparathyroidism of renal origin: Secondary | ICD-10-CM | POA: Diagnosis present

## 2023-03-31 DIAGNOSIS — E119 Type 2 diabetes mellitus without complications: Secondary | ICD-10-CM | POA: Diagnosis not present

## 2023-03-31 DIAGNOSIS — G40909 Epilepsy, unspecified, not intractable, without status epilepticus: Secondary | ICD-10-CM | POA: Diagnosis present

## 2023-03-31 DIAGNOSIS — G8324 Monoplegia of upper limb affecting left nondominant side: Secondary | ICD-10-CM | POA: Diagnosis present

## 2023-03-31 DIAGNOSIS — R569 Unspecified convulsions: Secondary | ICD-10-CM | POA: Diagnosis present

## 2023-03-31 DIAGNOSIS — N186 End stage renal disease: Secondary | ICD-10-CM | POA: Diagnosis present

## 2023-03-31 DIAGNOSIS — Z992 Dependence on renal dialysis: Secondary | ICD-10-CM | POA: Diagnosis not present

## 2023-03-31 DIAGNOSIS — I482 Chronic atrial fibrillation, unspecified: Secondary | ICD-10-CM | POA: Diagnosis present

## 2023-03-31 LAB — CBC WITH DIFFERENTIAL/PLATELET
Abs Immature Granulocytes: 0.1 10*3/uL — ABNORMAL HIGH (ref 0.00–0.07)
Basophils Absolute: 0 10*3/uL (ref 0.0–0.1)
Basophils Relative: 1 %
Eosinophils Absolute: 0.1 10*3/uL (ref 0.0–0.5)
Eosinophils Relative: 2 %
HCT: 36.3 % (ref 36.0–46.0)
Hemoglobin: 11.2 g/dL — ABNORMAL LOW (ref 12.0–15.0)
Immature Granulocytes: 2 %
Lymphocytes Relative: 13 %
Lymphs Abs: 0.8 10*3/uL (ref 0.7–4.0)
MCH: 28.7 pg (ref 26.0–34.0)
MCHC: 30.9 g/dL (ref 30.0–36.0)
MCV: 93.1 fL (ref 80.0–100.0)
Monocytes Absolute: 0.6 10*3/uL (ref 0.1–1.0)
Monocytes Relative: 10 %
Neutro Abs: 4.4 10*3/uL (ref 1.7–7.7)
Neutrophils Relative %: 72 %
Platelets: 166 10*3/uL (ref 150–400)
RBC: 3.9 MIL/uL (ref 3.87–5.11)
RDW: 15.1 % (ref 11.5–15.5)
WBC: 6 10*3/uL (ref 4.0–10.5)
nRBC: 0 % (ref 0.0–0.2)

## 2023-03-31 LAB — COMPREHENSIVE METABOLIC PANEL
ALT: 11 U/L (ref 0–44)
AST: 24 U/L (ref 15–41)
Albumin: 3.4 g/dL — ABNORMAL LOW (ref 3.5–5.0)
Alkaline Phosphatase: 83 U/L (ref 38–126)
Anion gap: 14 (ref 5–15)
BUN: 15 mg/dL (ref 8–23)
CO2: 24 mmol/L (ref 22–32)
Calcium: 8.1 mg/dL — ABNORMAL LOW (ref 8.9–10.3)
Chloride: 97 mmol/L — ABNORMAL LOW (ref 98–111)
Creatinine, Ser: 2.31 mg/dL — ABNORMAL HIGH (ref 0.44–1.00)
GFR, Estimated: 21 mL/min — ABNORMAL LOW (ref >60)
Glucose, Bld: 203 mg/dL — ABNORMAL HIGH (ref 70–99)
Potassium: 3.5 mmol/L (ref 3.5–5.1)
Sodium: 135 mmol/L (ref 135–145)
Total Bilirubin: 0.5 mg/dL (ref <1.2)
Total Protein: 7.3 g/dL (ref 6.5–8.1)

## 2023-03-31 LAB — PROTIME-INR
INR: 1 (ref 0.8–1.2)
Prothrombin Time: 13.5 s (ref 11.4–15.2)

## 2023-03-31 LAB — CBG MONITORING, ED: Glucose-Capillary: 173 mg/dL — ABNORMAL HIGH (ref 70–99)

## 2023-03-31 LAB — I-STAT CHEM 8, ED
BUN: 18 mg/dL (ref 8–23)
Calcium, Ion: 1 mmol/L — ABNORMAL LOW (ref 1.15–1.40)
Chloride: 96 mmol/L — ABNORMAL LOW (ref 98–111)
Creatinine, Ser: 2.5 mg/dL — ABNORMAL HIGH (ref 0.44–1.00)
Glucose, Bld: 205 mg/dL — ABNORMAL HIGH (ref 70–99)
HCT: 39 % (ref 36.0–46.0)
Hemoglobin: 13.3 g/dL (ref 12.0–15.0)
Potassium: 3.4 mmol/L — ABNORMAL LOW (ref 3.5–5.1)
Sodium: 138 mmol/L (ref 135–145)
TCO2: 27 mmol/L (ref 22–32)

## 2023-03-31 MED ORDER — SODIUM CHLORIDE 0.9 % IV SOLN
60.0000 mg/kg | Freq: Once | INTRAVENOUS | Status: DC
  Filled 2023-03-31: qty 40.8

## 2023-03-31 MED ORDER — LEVETIRACETAM IN NACL 1500 MG/100ML IV SOLN
1500.0000 mg | Freq: Once | INTRAVENOUS | Status: AC
  Administered 2023-03-31: 1500 mg via INTRAVENOUS
  Filled 2023-03-31: qty 100

## 2023-03-31 MED ORDER — LORAZEPAM 2 MG/ML IJ SOLN
1.0000 mg | Freq: Once | INTRAMUSCULAR | Status: AC
  Administered 2023-03-31: 1 mg via INTRAVENOUS
  Filled 2023-03-31: qty 1

## 2023-03-31 MED ORDER — SODIUM CHLORIDE 0.9 % IV SOLN
250.0000 mg | Freq: Two times a day (BID) | INTRAVENOUS | Status: DC
  Administered 2023-04-01 – 2023-04-02 (×3): 250 mg via INTRAVENOUS
  Filled 2023-03-31 (×4): qty 2.5

## 2023-03-31 MED ORDER — POLYETHYLENE GLYCOL 3350 17 G PO PACK: 17.0000 g | PACK | Freq: Every day | ORAL | Status: DC | PRN

## 2023-03-31 MED ORDER — ACETAMINOPHEN 650 MG RE SUPP: 650.0000 mg | Freq: Four times a day (QID) | RECTAL | Status: DC | PRN

## 2023-03-31 MED ORDER — LEVETIRACETAM IN NACL 500 MG/100ML IV SOLN: 500.0000 mg | Freq: Two times a day (BID) | INTRAVENOUS | Status: DC

## 2023-03-31 MED ORDER — IOHEXOL 350 MG/ML SOLN
75.0000 mL | Freq: Once | INTRAVENOUS | Status: AC | PRN
  Administered 2023-03-31: 75 mL via INTRAVENOUS

## 2023-03-31 MED ORDER — LEVETIRACETAM IN NACL 1000 MG/100ML IV SOLN
1000.0000 mg | Freq: Once | INTRAVENOUS | Status: AC
  Administered 2023-03-31: 1000 mg via INTRAVENOUS
  Filled 2023-03-31: qty 100

## 2023-03-31 MED ORDER — ENOXAPARIN SODIUM 30 MG/0.3ML IJ SOSY
30.0000 mg | PREFILLED_SYRINGE | INTRAMUSCULAR | Status: DC
  Administered 2023-03-31 – 2023-04-01 (×2): 30 mg via SUBCUTANEOUS
  Filled 2023-03-31 (×2): qty 0.3

## 2023-03-31 MED ORDER — ACETAMINOPHEN 325 MG PO TABS
650.0000 mg | ORAL_TABLET | Freq: Four times a day (QID) | ORAL | Status: DC | PRN
  Filled 2023-03-31: qty 2

## 2023-03-31 NOTE — Consult Note (Signed)
NEUROLOGY CONSULT NOTE   Date of service: March 31, 2023 [name] MRN:  [id number] DOB:  October 17, 1940 Chief Complaint: "seizure" Requesting Provider: Glyn Ade, MD  History of Present Illness  [name] is a 82 y.o. female with hx of DM2, Pafibb not on AC, pulm HTN, seizures and supposed to be on Keppra but was discontinued in dec 2023 who was brought in by EMS for seizure like activity.  She was with her daughter and eating and then daughter went in to make some coffee and came back to find her shaking. Called EMS and was noted to have GTC seizure that lasted 6-7 mins. EMS gave her Versed. Upon arrival to the ED, she had L gaze deviation and moving her RUE and RLE but not her LUE and LLE. Gaze deviation resolved after she was given some Keppra in the ED.  I spoke with her daughter who confirms hx of seizure but reports that she was taken off multiple medications including her Aeds last year for unclear reason. She did not follow up with neurology.    ROS  Unable to ascertain due to somnolence.  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   Diabetes mellitus without complication (HCC)    type 2   Dyspnea    with exertion   Hypertension    Neuromuscular disorder (HCC)    neuropathy bil feet   PAF (paroxysmal atrial fibrillation) (HCC) 08/2020   Pneumonia    Pulmonary hypertension (HCC)     Past Surgical History:  Procedure Laterality Date   A/V FISTULAGRAM Right 07/07/2022   Procedure: A/V Fistulagram;  Surgeon: Victorino Sparrow, MD;  Location: [facility] INVASIVE CV LAB;  Service: Cardiovascular;  Laterality: Right;   A/V SHUNT INTERVENTION N/A 07/07/2022   Procedure: A/V SHUNT INTERVENTION;  Surgeon: Victorino Sparrow, MD;  Location: [facility] INVASIVE CV LAB;  Service: Cardiovascular;  Laterality: N/A;   AV FISTULA PLACEMENT Left 06/18/2021   Procedure:  LEFT ARM ARTERIOVENOUS (AV) FISTULA CREATION;  Surgeon: Maeola Harman, MD;  Location: [facility] OR;  Service: Vascular;  Laterality: Left;   AV FISTULA PLACEMENT Right 01/29/2022   Procedure: RIGHT ARM BRACHIOCEPHALIC ARTERIOVENOUS (AV) FISTULA CREATION;  Surgeon: Cephus Shelling, MD;  Location: [facility] OR;  Service: Vascular;  Laterality: Right;   AV FISTULA PLACEMENT Right 08/27/2022   Procedure: RIGHT ARM FIRST STAGE BASILIC ARTERIOVENOUS (AV) FISTULA CREATION;  Surgeon: Cephus Shelling, MD;  Location: [facility] OR;  Service: Vascular;  Laterality: Right;   BASCILIC VEIN TRANSPOSITION Left 08/28/2021   Procedure: LEFT ARM BASILIC VEIN TRANSPOSITION;  Surgeon: Maeola Harman, MD;  Location: [facility] OR;  Service: Vascular;  Laterality: Left;   BASCILIC VEIN TRANSPOSITION Right 12/29/2022   Procedure: RIGHT ARM ARTERIOVENOUS GRAFT INSERTION USING 4-5mm GORETEX STRETCH GRAFT;  Surgeon: Cephus Shelling, MD;  Location: [facility] OR;  Service: Vascular;  Laterality: Right;   CARDIOVERSION N/A 01/14/2021   Procedure: CARDIOVERSION;  Surgeon: Yates Decamp, MD;  Location: [facility] ENDOSCOPY;  Service: Cardiovascular;  Laterality: N/A;   IR FLUORO GUIDE CV LINE RIGHT  06/15/2021   IR US GUIDE VASC ACCESS RIGHT  06/15/2021   LIGATION OF ARTERIOVENOUS  FISTULA Right 08/27/2022   Procedure: LIGATE EXISTING RIGHT ARM BRACHIOCEPHALIC ARTERIOVENOUS FISTULA;  Surgeon: Cephus Shelling, MD;  Location: [facility] OR;  Service: Vascular;  Laterality: Right;   LIGATION  OF ARTERIOVENOUS  FISTULA Right 12/29/2022   Procedure: LIGATION OF RIGHT ARM BASILIC ARTERIOVENOUS  FISTULA;  Surgeon: Cephus Shelling, MD;  Location: [facility] OR;  Service: Vascular;  Laterality: Right;   TONSILLECTOMY      Family History: Family History  Family history unknown: Yes    Social History  reports that she has never smoked. She has never been exposed to tobacco smoke. She has never used smokeless tobacco. She reports that she does not drink  alcohol and does not use drugs.  Allergies  Allergen Reactions   Amlodipine Other (See Comments)    Patient has LE edema to this med at higher doses (10 mg)    Medications   Current Facility-Administered Medications:    sodium chloride flush (NS) 0.9 % injection 3 mL, 3 mL, Intravenous, Q12H, Daria Pastures, MD   sodium chloride flush (NS) 0.9 % injection 3 mL, 3 mL, Intravenous, PRN, Daria Pastures, MD No current outpatient medications on file.  Vitals   Vitals:   2023-04-08 1300 2023-04-08 1330 04-08-23 1400 04-08-2023 1430  BP: (!) 140/69 (!) 122/57 130/67 (!) 136/91  Pulse: 86 81  84  Resp: 17 12 13 13   Temp:      TempSrc:      SpO2: 100% 100%  100%  Weight:        Body mass index is 25.75 kg/m.  Physical Exam   General: Laying comfortably in bed; in no acute distress. Appears frail and old. HENT: Normal oropharynx and mucosa. Normal external appearance of ears and nose.  Neck: Supple, no pain or tenderness  CV: No JVD. No peripheral edema.  Pulmonary: Symmetric Chest rise. Normal respiratory effort.  Abdomen: Soft to touch, non-tender.  Ext: No cyanosis, edema, or deformity  Skin: No rash. Normal palpation of skin.   Musculoskeletal: Normal digits and nails by inspection. No clubbing.   Neurologic Examination  Mental status/Cognition: somnolent, opens eye to loud voice and vigorous tactile stimulation. oriented to self, place, but not to month and year, poor attention.  Speech/language: Fluent, comprehension intact to simple commands, object naming intact. Cranial nerves:   CN II Pupils equal and reactive to light, blinks to threat BL   CN III,IV,VI R gaze preference, does cross midline briefly   CN V normal sensation in V1, V2, and V3 segments bilaterally   CN VII no asymmetry, no nasolabial fold flattening   CN VIII normal hearing to speech   CN IX & X normal palatal elevation, no uvular deviation   CN XI Head midline.   CN XII midline tongue protrusion    Motor:  Muscle bulk: poor, tone normal Strength testing limited by somnolence. Moves RUE and RLE spontaneously and antigravity. Slight movement in LUE, specifically in L hand. Moves LLE spontaneously but not antigravity.  Sensation:  Light touch    Pin prick Localizes to proximal pinch in all extremities.   Temperature    Vibration   Proprioception    Coordination/Complex Motor:  - Unable to assess.  Labs/Imaging/Neurodiagnostic studies   CBC:  Recent Labs  Lab April 08, 2023 1248 Apr 08, 2023 1250  WBC 6.0  --   NEUTROABS 4.4  --   HGB 11.2* 13.3  HCT 36.3 39.0  MCV 93.1  --   PLT 166  --    Basic Metabolic Panel:  Lab Results  Component Value Date   NA 138 04/08/2023   K 3.4 (L) Apr 08, 2023   CO2 24 04/08/2023   GLUCOSE 205 (H) 04-08-23  BUN 18 03/31/2023   CREATININE 2.50 (H) 03/31/2023   CALCIUM 8.1 (L) 03/31/2023   GFRNONAA 21 (L) 03/31/2023   GFRAA 38 (L) 08/09/2019   Lipid Panel:  Lab Results  Component Value Date   LDLCALC 63 01/31/2022   HgbA1c:  Lab Results  Component Value Date   HGBA1C 6.1 (H) 07/23/2022   Urine Drug Screen: No results found for: "LABOPIA", "COCAINSCRNUR", "LABBENZ", "AMPHETMU", "THCU", "LABBARB"  Alcohol Level No results found for: "ETH" INR  Lab Results  Component Value Date   INR 1.0 03/31/2023   APTT  Lab Results  Component Value Date   APTT 32 04/24/2021   AED levels: No results found for: "PHENYTOIN", "ZONISAMIDE", "LAMOTRIGINE", "LEVETIRACETA"  CT Head without contrast(Personally reviewed): CTH was negative for a large hypodensity concerning for a large territory infarct or hyperdensity concerning for an ICH  CT angio Head and Neck with contrast(Personally reviewed): No LVO  MRI Brain(Personally reviewed): pending  Neurodiagnostics cEEG:  pending  ASSESSMENT   Tahj Pralle is a 82 y.o. female with hx of DM2, Pafibb not on AC, pulm HTN, seizures and supposed to be on Keppra but was discontinued in dec  2023 who was brought in by EMS for seizure like activity. Got 5mg  of Versed with resolution of clinical seizure but still persistent L gaze and L sided weakness. Was given Keppra load in the ED with resolution of gaze deviation. Still continues to be weak on the left with a R gaze preference which i suspect is post ictal. Will get MRI brain and put her up on cEEG to rule out stroke vs subclinical seizures.  Etiology of her seizures I suspect is likely taken off Keppra despite clear indication to continue. I am not sure why this was done. Daughter is not sure either.  RECOMMENDATIONS  - continue Keppra 250mg  BID - MRI brain w/o C - LTM EEG overnight. - we will follow to ensure resolution of left sided post ictal todds paresis. ______________________________________________________________________    Welton Flakes, MD [facility]

## 2023-03-31 NOTE — ED Provider Notes (Signed)
[place] EMERGENCY DEPARTMENT AT [facility] Provider Note   CSN: [id number] Arrival date & time: 03/31/23  1211     History  Chief Complaint  Patient presents with   Altered Mental Status   Seizures    [name] is a 82 y.o. female.  82 year old female with a past medical history of ESRD on HD, seizure disorder previously on Keppra, T2DM presents here for seizure-like activity that occurred at approximately 11:30 AM this morning and was witnessed by the patient's daughter.  Patient's daughter states that the patient had generalized tonic-clonic activity in bilateral upper and lower extremities.  She was not responsive during this episode.  Paramedics estimate that the activity lasted approximately 5 to 6 minutes.  Patient was still having seizure activity when EMS arrived.  They administered 5 mg IM Versed.  Seizure activity ceased shortly after medication administration.  Patient arrives here somnolent but arousable to noxious stimuli.  Patient is unable to provide any history.  The history is provided by the EMS personnel and a caregiver. The history is limited by the condition of the patient.  Altered Mental Status Associated symptoms: seizures   Seizures      Home Medications Prior to Admission medications   Medication Sig Start Date End Date Taking? Authorizing Provider  linagliptin (TRADJENTA) 5 MG TABS tablet Take 1 tablet (5 mg total) by mouth daily. Patient not taking: Reported on 12/27/2022 07/23/22   Maretta Bees, MD  oxyCODONE-acetaminophen (PERCOCET) 5-325 MG tablet Take 1 tablet by mouth every 6 (six) hours as needed. 12/29/22   Rhyne, Ames Coupe, PA-C      Allergies    Amlodipine    Review of Systems   Review of Systems  Neurological:  Positive for seizures.    Physical Exam Updated Vital Signs BP 125/68 (BP Location: Left Arm)   Pulse 91   Temp 98.7 F (37.1 C) (Rectal)   Resp (!) 26   Wt 68 kg   SpO2 100%   BMI 25.75 kg/m   Physical Exam Vitals reviewed.  Constitutional:      General: She is not in acute distress.    Appearance: She is not ill-appearing, toxic-appearing or diaphoretic.     Comments: Somnolent but responds to noxious stimuli  HENT:     Nose: Nose normal.     Mouth/Throat:     Mouth: Mucous membranes are moist.  Eyes:     Comments: Pupils equal, pinpoint, minimally reactive bilaterally  Cardiovascular:     Rate and Rhythm: Normal rate and regular rhythm.     Pulses:          Radial pulses are 2+ on the right side and 2+ on the left side.     Heart sounds: Normal heart sounds. No murmur heard.    No friction rub. No gallop.  Pulmonary:     Effort: Pulmonary effort is normal. No respiratory distress.     Breath sounds: Normal breath sounds. No wheezing, rhonchi or rales.  Abdominal:     General: There is no distension.     Palpations: Abdomen is soft.     Tenderness: There is no abdominal tenderness. There is no guarding or rebound.  Musculoskeletal:     Right lower leg: No edema.     Left lower leg: No edema.  Skin:    General: Skin is warm and dry.  Neurological:     GCS: GCS eye subscore is 1. GCS verbal subscore is 2. GCS  motor subscore is 5.     Comments: Patient grimaces symmetrically.  Withdraws bilateral lower extremities to pain.  Right upper extremity localizes to pain.  Left upper extremity without movement.     ED Results / Procedures / Treatments   Labs (all labs ordered are listed, but only abnormal results are displayed) Labs Reviewed  CBG MONITORING, ED - Abnormal; Notable for the following components:      Result Value   Glucose-Capillary 173 (*)    All other components within normal limits  CBC WITH DIFFERENTIAL/PLATELET  COMPREHENSIVE METABOLIC PANEL  PROTIME-INR  I-STAT CHEM 8, ED    EKG None  Radiology No results found.  Procedures Procedures    Medications Ordered in ED Medications  levETIRAcetam (KEPPRA) IVPB 1500 mg/ 100 mL premix (0 mg  Intravenous Stopped 03/31/23 1353)    And  levETIRAcetam (KEPPRA) IVPB 1500 mg/ 100 mL premix (0 mg Intravenous Stopped 03/31/23 1353)    And  levETIRAcetam (KEPPRA) IVPB 1000 mg/100 mL premix (0 mg Intravenous Stopped 03/31/23 1330)  iohexol (OMNIPAQUE) 350 MG/ML injection 75 mL (75 mLs Intravenous Contrast Given 03/31/23 1315)    ED Course/ Medical Decision Making/ A&P Clinical Course as of 03/31/23 1650  Thu Mar 31, 2023  1511 Sz and new left arm paresis.  [CC]  1523 S- hx seizures, here for GTC seizure, 5 mg versed with EMS, keppra here. Not moving LUE, CTA done. Neuro consulted. Also in afib, which is new -Fu neuro recs, then admit [HJ]    Clinical Course User Index [CC] Glyn Ade, MD [HJ] Janyth Pupa, MD                                 Medical Decision Making Amount and/or Complexity of Data Reviewed Independent Historian: caregiver and EMS Labs: ordered. Radiology: ordered and independent interpretation performed. ECG/medicine tests: ordered and independent interpretation performed.  Risk Prescription drug management. Decision regarding hospitalization.   82 year old female presents here after reported witnessed seizure activity.  Received 5 mg IM Versed with EMS.  Arrives here GCS 8.  Somnolent but arousable to noxious stimuli.  Localizes to pain.  Her speech is incoherent.  She is moving all extremities except her left upper extremity, which is flaccid.  Vitals reassuring and stable.  Initial differential diagnosis includes ICH, CVA, seizure, electrolyte abnormality.  Will plan to obtain CTA head and neck, chest x-ray, EKG, screening labs including CMP, CBC, blood glucose.  Intermittent left-sided gaze deviation noted.  However, not sustained.  Do not appreciate any seizure activity.  Do not feel the patient requires repeat benzo administration.  Will Keppra load to prevent further seizures.  I independently reviewed the patient's ECG, which demonstrates  irregularly irregular rhythm consistent with A-fib.  No ischemic findings noted.  I independently reviewed the patient's CT imaging.  No evidence of intracranial hemorrhage or large vessel occlusion.  Independently reviewed the patient's labs.  CBC notable for anemia that is at patient's baseline.  CMP without clinically significant findings.  On reassessment, patient's mental status is improving.  She is following commands.  She is able to raise right upper extremity and right lower extremity against gravity.  Does continue to have movement of her left lower extremity.  No movement of her left upper extremity appreciated.  Cranial nerves appear intact.  Neurology consulted for management recommendations.  Patient discussed with neurology.  They recommend continuous EEG.  They do not  feel MRI needs to be performed at this time.  Patient is felt to require admission.  She was discussed with the hospitalist, who has accepted her for admission here.  Patient's presentation is most consistent with acute presentation with potential threat to life or bodily function.         Final Clinical Impression(s) / ED Diagnoses Final diagnoses:  Seizure [facility])    Rx / DC Orders ED Discharge Orders     None         Rolla Flatten, MD 03/31/23 1657    Benjiman Core, MD 04/01/23 [phone]

## 2023-03-31 NOTE — ED Triage Notes (Signed)
Patient presents via EMS from home for altered mental status and seizure-like activity. Per EMS, approximately 45 minutes ago, the patient experienced an episode of unresponsiveness while eating. Per EMS, patient was exhibiting grand-mal seizure-like activity upon their arrival; lasted approximately 6-7 minutes. Patient received 5mg  IM versed from EMS. Per EMS, patient has no history of seizures and was only responsive to pain. Per EMS, patient is a dialysis patient and has a history of diabetes. CBG 236.  Upon arrival, patient has left gaze deviation, localizes to pain in the right upper extremity, withdraws to pain in bilateral lower extremities, and has no movement in the left upper extremity. Patient transitioned to 3L [place] satting at 100%.

## 2023-03-31 NOTE — ED Notes (Signed)
ED TO INPATIENT HANDOFF REPORT  ED Nurse Name and Phone #: [phone]  S Name/Age/Gender [name] 82 y.o. female Room/Bed: 015C/015C  Code Status   Code Status: Prior  Home/SNF/Other Home Patient oriented to: self, place, time, and situation Is this baseline? Yes   Triage Complete: Triage complete  Chief Complaint Seizure [facility]) [R56.9]  Triage Note Patient presents via EMS from home for altered mental status and seizure-like activity. Per EMS, approximately 45 minutes ago, the patient experienced an episode of unresponsiveness while eating. Per EMS, patient was exhibiting grand-mal seizure-like activity upon their arrival; lasted approximately 6-7 minutes. Patient received 5mg  IM versed from EMS. Per EMS, patient has no history of seizures and was only responsive to pain. Per EMS, patient is a dialysis patient and has a history of diabetes. CBG 236.  Upon arrival, patient has left gaze deviation, localizes to pain in the right upper extremity, withdraws to pain in bilateral lower extremities, and has no movement in the left upper extremity. Patient transitioned to 3L [place] satting at 100%.   Allergies Allergies  Allergen Reactions   Amlodipine Other (See Comments)    Patient has LE edema to this med at higher doses (10 mg)    Level of Care/Admitting Diagnosis ED Disposition     ED Disposition  Admit   Condition  --   Comment  Hospital Area: [facility] [[id number]]  Level of Care: Telemetry Medical [[id number]]  May admit patient to [facility] or [facility] if equivalent level of care is available:: No  Covid Evaluation: Asymptomatic - no recent exposure (last 10 days) testing not required  Diagnosis: Seizure (HCC) [205090]  Admitting Physician: Marinda Elk [[id number]]  Attending Physician: Marinda Elk [[id number]]  Certification:: I certify this patient will need inpatient services for at least 2 midnights  Expected Medical Readiness: 04/01/2023           B Medical/Surgery History Past Medical History:  Diagnosis Date   CHF (congestive heart failure) (HCC)    Chronic kidney disease    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   Diabetes mellitus without complication (HCC)    type 2   Dyspnea    with exertion   Hypertension    Neuromuscular disorder (HCC)    neuropathy bil feet   PAF (paroxysmal atrial fibrillation) (HCC) 08/2020   Pneumonia    Pulmonary hypertension (HCC)    Past Surgical History:  Procedure Laterality Date   A/V FISTULAGRAM Right 07/07/2022   Procedure: A/V Fistulagram;  Surgeon: Victorino Sparrow, MD;  Location: [facility] INVASIVE CV LAB;  Service: Cardiovascular;  Laterality: Right;   A/V SHUNT INTERVENTION N/A 07/07/2022   Procedure: A/V SHUNT INTERVENTION;  Surgeon: Victorino Sparrow, MD;  Location: [facility] INVASIVE CV LAB;  Service: Cardiovascular;  Laterality: N/A;   AV FISTULA PLACEMENT Left 06/18/2021   Procedure: LEFT ARM ARTERIOVENOUS (AV) FISTULA CREATION;  Surgeon: Maeola Harman, MD;  Location: [facility] OR;  Service: Vascular;  Laterality: Left;   AV FISTULA PLACEMENT Right 01/29/2022   Procedure: RIGHT ARM BRACHIOCEPHALIC ARTERIOVENOUS (AV) FISTULA CREATION;  Surgeon: Cephus Shelling, MD;  Location: [facility] OR;  Service: Vascular;  Laterality: Right;   AV FISTULA PLACEMENT Right 08/27/2022   Procedure: RIGHT ARM FIRST STAGE BASILIC ARTERIOVENOUS (AV) FISTULA CREATION;  Surgeon: Cephus Shelling, MD;  Location: [facility] OR;  Service: Vascular;  Laterality: Right;   BASCILIC VEIN TRANSPOSITION Left 08/28/2021   Procedure: LEFT ARM BASILIC VEIN TRANSPOSITION;  Surgeon: Maeola Harman,  MD;  Location: [facility] OR;  Service: Vascular;  Laterality: Left;   BASCILIC VEIN TRANSPOSITION Right 12/29/2022   Procedure: RIGHT ARM ARTERIOVENOUS GRAFT INSERTION USING 4-44mm GORETEX STRETCH GRAFT;  Surgeon: Cephus Shelling, MD;  Location: [facility] OR;  Service: Vascular;  Laterality: Right;   CARDIOVERSION N/A 01/14/2021   Procedure:  CARDIOVERSION;  Surgeon: Yates Decamp, MD;  Location: [facility] ENDOSCOPY;  Service: Cardiovascular;  Laterality: N/A;   IR FLUORO GUIDE CV LINE RIGHT  06/15/2021   IR US GUIDE VASC ACCESS RIGHT  06/15/2021   LIGATION OF ARTERIOVENOUS  FISTULA Right 08/27/2022   Procedure: LIGATE EXISTING RIGHT ARM BRACHIOCEPHALIC ARTERIOVENOUS FISTULA;  Surgeon: Cephus Shelling, MD;  Location: [facility] OR;  Service: Vascular;  Laterality: Right;   LIGATION OF ARTERIOVENOUS  FISTULA Right 12/29/2022   Procedure: LIGATION OF RIGHT ARM BASILIC ARTERIOVENOUS  FISTULA;  Surgeon: Cephus Shelling, MD;  Location: [facility] OR;  Service: Vascular;  Laterality: Right;   TONSILLECTOMY       A IV Location/Drains/Wounds Patient Lines/Drains/Airways Status     Active Line/Drains/Airways     Name Placement date Placement time Site Days   Peripheral IV 03/31/23 20 G Anterior;Left Forearm 03/31/23  --  Forearm  less than 1   Peripheral IV 03/31/23 18 G Anterior;Left;Proximal Forearm 03/31/23  1247  Forearm  less than 1   Fistula / Graft Left Upper arm Arteriovenous fistula 06/18/21  1129  Upper arm  651   Fistula / Graft Right Upper arm Arteriovenous fistula 08/27/22  1208  Upper arm  216   Fistula / Graft Right Upper arm Arteriovenous vein graft 12/29/22  1108  Upper arm  92   Hemodialysis Catheter Right Internal jugular Double lumen Permanent (Tunneled) 06/15/21  0919  Internal jugular  654   Wound / Incision (Open or Dehisced) 01/30/22 Venous stasis ulcer Pretibial Left Pink tissue wound on anterior of left lower leg with surrounding darkened skin, has minimal drainage 01/30/22  1400  Pretibial  425            Intake/Output Last 24 hours No intake or output data in the 24 hours ending 03/31/23 1620  Labs/Imaging Results for orders placed or performed during the hospital encounter of 03/31/23 (from the past 48 hour(s))  CBG monitoring, ED     Status: Abnormal   Collection Time: 03/31/23 12:18 PM  Result Value Ref Range    Glucose-Capillary 173 (H) 70 - 99 mg/dL    Comment: Glucose reference range applies only to samples taken after fasting for at least 8 hours.   Comment 1 Notify RN    Comment 2 Document in Chart   Comprehensive metabolic panel     Status: Abnormal   Collection Time: 03/31/23 12:40 PM  Result Value Ref Range   Sodium 135 135 - 145 mmol/L   Potassium 3.5 3.5 - 5.1 mmol/L   Chloride 97 (L) 98 - 111 mmol/L   CO2 24 22 - 32 mmol/L   Glucose, Bld 203 (H) 70 - 99 mg/dL    Comment: Glucose reference range applies only to samples taken after fasting for at least 8 hours.   BUN 15 8 - 23 mg/dL   Creatinine, Ser 1.61 (H) 0.44 - 1.00 mg/dL   Calcium 8.1 (L) 8.9 - 10.3 mg/dL   Total Protein 7.3 6.5 - 8.1 g/dL   Albumin 3.4 (L) 3.5 - 5.0 g/dL   AST 24 15 - 41 U/L   ALT 11 0 - 44 U/L  Alkaline Phosphatase 83 38 - 126 U/L   Total Bilirubin 0.5 <1.2 mg/dL   GFR, Estimated 21 (L) >60 mL/min    Comment: (NOTE) Calculated using the CKD-EPI Creatinine Equation (2021)    Anion gap 14 5 - 15    Comment: Performed at [facility], [address] [place], [place] [address]  Protime-INR     Status: None   Collection Time: 03/31/23 12:40 PM  Result Value Ref Range   Prothrombin Time 13.5 11.4 - 15.2 seconds   INR 1.0 0.8 - 1.2    Comment: (NOTE) INR goal varies based on device and disease states. Performed at [facility], [address], [place], [place] [address]   CBC with Differential/Platelet     Status: Abnormal   Collection Time: 03/31/23 12:48 PM  Result Value Ref Range   WBC 6.0 4.0 - 10.5 K/uL   RBC 3.90 3.87 - 5.11 MIL/uL   Hemoglobin 11.2 (L) 12.0 - 15.0 g/dL   HCT 75.6 43.3 - 29.5 %   MCV 93.1 80.0 - 100.0 fL   MCH 28.7 26.0 - 34.0 pg   MCHC 30.9 30.0 - 36.0 g/dL   RDW 18.8 41.6 - 60.6 %   Platelets 166 150 - 400 K/uL   nRBC 0.0 0.0 - 0.2 %   Neutrophils Relative % 72 %   Neutro Abs 4.4 1.7 - 7.7 K/uL   Lymphocytes Relative 13 %   Lymphs Abs 0.8 0.7 - 4.0 K/uL    Monocytes Relative 10 %   Monocytes Absolute 0.6 0.1 - 1.0 K/uL   Eosinophils Relative 2 %   Eosinophils Absolute 0.1 0.0 - 0.5 K/uL   Basophils Relative 1 %   Basophils Absolute 0.0 0.0 - 0.1 K/uL   Immature Granulocytes 2 %   Abs Immature Granulocytes 0.10 (H) 0.00 - 0.07 K/uL    Comment: Performed at [facility], [address] [place], [place] [address]  I-stat chem 8, ED     Status: Abnormal   Collection Time: 03/31/23 12:50 PM  Result Value Ref Range   Sodium 138 135 - 145 mmol/L   Potassium 3.4 (L) 3.5 - 5.1 mmol/L   Chloride 96 (L) 98 - 111 mmol/L   BUN 18 8 - 23 mg/dL   Creatinine, Ser 1.09 (H) 0.44 - 1.00 mg/dL   Glucose, Bld 323 (H) 70 - 99 mg/dL    Comment: Glucose reference range applies only to samples taken after fasting for at least 8 hours.   Calcium, Ion 1.00 (L) 1.15 - 1.40 mmol/L   TCO2 27 22 - 32 mmol/L   Hemoglobin 13.3 12.0 - 15.0 g/dL   HCT 55.7 32.2 - 02.5 %   CT ANGIO HEAD NECK W WO CM  Result Date: 03/31/2023 CLINICAL DATA:  Neuro deficit, acute, stroke suspected EXAM: CT ANGIOGRAPHY HEAD AND NECK WITH AND WITHOUT CONTRAST TECHNIQUE: Multidetector CT imaging of the head and neck was performed using the standard protocol during bolus administration of intravenous contrast. Multiplanar CT image reconstructions and MIPs were obtained to evaluate the vascular anatomy. Carotid stenosis measurements (when applicable) are obtained utilizing NASCET criteria, using the distal internal carotid diameter as the denominator. RADIATION DOSE REDUCTION: This exam was performed according to the departmental dose-optimization program which includes automated exposure control, adjustment of the mA and/or kV according to patient size and/or use of iterative reconstruction technique. CONTRAST:  75mL OMNIPAQUE IOHEXOL 350 MG/ML SOLN COMPARISON:  CT Head 07/22/22 FINDINGS:  CT HEAD FINDINGS Brain: No hemorrhage. No hydrocephalus. No extra-axial fluid collection. No CT evidence of  an acute cortical infarct. 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 No mass effect. No mass lesion. There is sequela of severe chronic microvascular ischemic change. Generalized volume loss without lobar predominance. Mineralization of the basal ganglia bilaterally. No contrast-enhancing lesion is visualized. Vascular: No hyperdense vessel or unexpected calcification. Skull: Normal. Negative for fracture or focal lesion. Sinuses/Orbits: No middle ear or mastoid effusion. Paranasal sinuses are clear. Bilateral lens replacement. Orbits are otherwise unremarkable. Other: None. Review of the MIP images confirms the above findings CTA NECK FINDINGS Aortic arch: Standard branching. Imaged portion shows no evidence of aneurysm or dissection. No significant stenosis of the major arch vessel origins. Right carotid system: No evidence of dissection, stenosis (50% or greater), or occlusion. Left carotid system: No evidence of dissection, stenosis (50% or greater), or occlusion. Vertebral arteries: Codominant. No evidence of dissection, stenosis (50% or greater), or occlusion. Skeleton: Negative. Other neck: Negative. Upper chest: Right-sided central venous catheter in place with a possible small amount of catheter associated thrombus in the right SVC (series 14, image 158). Recommend further evaluation with a right upper extremity DVT ultrasound Review of the MIP images confirms the above findings CTA HEAD FINDINGS Anterior circulation: No significant stenosis, proximal occlusion, aneurysm, or vascular malformation. Posterior circulation: No significant stenosis, proximal occlusion, aneurysm, or vascular malformation. Venous sinuses: As permitted by contrast timing, patent. Anatomic variants: None Review of the MIP images confirms the above findings IMPRESSION: 1. No hemorrhage or CT evidence of an acute cortical infarct. 2. No intracranial large vessel occlusion or significant  stenosis. 3. No hemodynamically significant stenosis in the neck. 4. Right-sided central venous catheter in place with a possible small amount of catheter associated thrombus in the right SVC. Recommend further evaluation with a right upper extremity DVT ultrasound. Electronically Signed   By: Lorenza Cambridge M.D.   On: 03/31/2023 13:38   DG Chest Portable 1 View  Result Date: 03/31/2023 CLINICAL DATA:  Altered mental status EXAM: PORTABLE CHEST 1 VIEW COMPARISON:  07/22/2022 FINDINGS: Perma catheter on the right with tip at the upper right atrium. Cardiomegaly. Mediastinal contours are distorted by rightward rotation, likely stable. There is no edema, consolidation, effusion, or pneumothorax. IMPRESSION: No evidence of active disease. Electronically Signed   By: Tiburcio Pea M.D.   On: 03/31/2023 12:51    Pending Labs Unresulted Labs (From admission, onward)     Start     Ordered   03/31/23 1217  CBC with Differential  Once,   STAT        03/31/23 1217            Vitals/Pain Today's Vitals   03/31/23 1330 03/31/23 1400 03/31/23 1430 03/31/23 1552  BP: (!) 122/57 130/67 (!) 136/91   Pulse: 81  84   Resp: 12 13 13    Temp:    98.6 F (37 C)  TempSrc:    Oral  SpO2: 100%  100%   Weight:        Isolation Precautions No active isolations  Medications Medications  levETIRAcetam (KEPPRA) IVPB 1500 mg/ 100 mL premix (0 mg Intravenous Stopped 03/31/23 1353)    And  levETIRAcetam (KEPPRA) IVPB 1500 mg/ 100 mL premix (0 mg Intravenous Stopped 03/31/23 1353)    And  levETIRAcetam (KEPPRA) IVPB 1000 mg/100 mL premix (0 mg Intravenous Stopped 03/31/23 1330)  iohexol (OMNIPAQUE) 350 MG/ML  injection 75 mL (75 mLs Intravenous Contrast Given 03/31/23 1315)    Mobility walks     Focused Assessments    R Recommendations: See Admitting Provider Note  Report given to:   Additional Notes: Call or epic message for any additional questions

## 2023-03-31 NOTE — ED Notes (Signed)
Patient transported to CT 

## 2023-03-31 NOTE — H&P (Signed)
History and Physical  [name] WUX:[id number] DOB: July 27, 1940 DOA: 03/31/2023  PCP: Norm Salt, PA Patient coming from: Home  I have personally briefly reviewed patient's old medical records in [facility]   Chief Complaint: Witnessed seizures  HPI: [name] is a 82 y.o. 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  Shortly after administration seizure activity ceased.  The patient remains somnolent postictal.  In the ED: Upon arrival to the ED she had a left gaze deviation, but decreased movement of left upper extremity and left lower extremity   Review of Systems: All systems reviewed and apart from history of presenting illness, are negative.  Past Medical History:  Diagnosis Date   CHF (congestive heart failure) (HCC)    Chronic kidney disease    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   Diabetes mellitus without complication (HCC)    type 2   Dyspnea    with exertion   Hypertension    Neuromuscular disorder (HCC)    neuropathy bil feet   PAF (paroxysmal atrial fibrillation) (HCC) 08/2020   Pneumonia    Pulmonary hypertension (HCC)    Past Surgical History:  Procedure Laterality Date   A/V FISTULAGRAM Right 07/07/2022   Procedure: A/V Fistulagram;  Surgeon: Victorino Sparrow, MD;  Location: [facility] INVASIVE CV LAB;  Service: Cardiovascular;  Laterality: Right;   A/V SHUNT INTERVENTION N/A 07/07/2022   Procedure: A/V SHUNT INTERVENTION;  Surgeon: Victorino Sparrow, MD;  Location: [facility] INVASIVE CV LAB;  Service: Cardiovascular;  Laterality: N/A;   AV FISTULA PLACEMENT Left 06/18/2021   Procedure: LEFT ARM ARTERIOVENOUS (AV) FISTULA CREATION;  Surgeon:  Maeola Harman, MD;  Location: [facility] OR;  Service: Vascular;  Laterality: Left;   AV FISTULA PLACEMENT Right 01/29/2022   Procedure: RIGHT ARM BRACHIOCEPHALIC ARTERIOVENOUS (AV) FISTULA CREATION;  Surgeon: Cephus Shelling, MD;  Location: [facility] OR;  Service: Vascular;  Laterality: Right;   AV FISTULA PLACEMENT Right 08/27/2022   Procedure: RIGHT ARM FIRST STAGE BASILIC ARTERIOVENOUS (AV) FISTULA CREATION;  Surgeon: Cephus Shelling, MD;  Location: [facility] OR;  Service: Vascular;  Laterality: Right;   BASCILIC VEIN TRANSPOSITION Left 08/28/2021   Procedure: LEFT ARM BASILIC VEIN TRANSPOSITION;  Surgeon: Maeola Harman, MD;  Location: [facility] OR;  Service: Vascular;  Laterality: Left;   BASCILIC VEIN TRANSPOSITION Right 12/29/2022   Procedure: RIGHT ARM ARTERIOVENOUS GRAFT INSERTION USING 4-13mm GORETEX STRETCH GRAFT;  Surgeon: Cephus Shelling, MD;  Location: [facility] OR;  Service: Vascular;  Laterality: Right;   CARDIOVERSION N/A 01/14/2021   Procedure: CARDIOVERSION;  Surgeon: Yates Decamp, MD;  Location: [facility] ENDOSCOPY;  Service: Cardiovascular;  Laterality: N/A;   IR FLUORO GUIDE CV LINE RIGHT  06/15/2021   IR US GUIDE VASC ACCESS RIGHT  06/15/2021   LIGATION OF ARTERIOVENOUS  FISTULA Right 08/27/2022   Procedure: LIGATE EXISTING RIGHT ARM BRACHIOCEPHALIC ARTERIOVENOUS FISTULA;  Surgeon: Cephus Shelling, MD;  Location: [facility] OR;  Service: Vascular;  Laterality: Right;   LIGATION OF ARTERIOVENOUS  FISTULA Right 12/29/2022   Procedure: LIGATION OF RIGHT ARM BASILIC ARTERIOVENOUS  FISTULA;  Surgeon: Cephus Shelling, MD;  Location: [facility] OR;  Service: Vascular;  Laterality: Right;  TONSILLECTOMY     Social History:  reports that she has never smoked. She has never been exposed to tobacco smoke. She has never used smokeless tobacco. She reports that she does not drink alcohol and does not use drugs.   Allergies  Allergen Reactions   Amlodipine Other (See Comments)    Patient has LE edema to  this med at higher doses (10 mg)    Family History  Family history unknown: Yes  No known family history  Prior to Admission medications   Not on File   Physical Exam: Vitals:   03/31/23 1330 03/31/23 1400 03/31/23 1430 03/31/23 1552  BP: (!) 122/57 130/67 (!) 136/91   Pulse: 81  84   Resp: 12 13 13    Temp:    98.6 F (37 C)  TempSrc:    Oral  SpO2: 100%  100%   Weight:        General exam: Moderately built and nourished patient, lying comfortably supine, cachectic appearing bald Head, eyes and ENT: Nontraumatic and normocephalic.  Bald Neck: Supple. No JVD. Lymphatics: No lymphadenopathy. Respiratory system: Clear to auscultation. No increased work of breathing. Cardiovascular system: Regular rate and rhythm with positive S1-S2. Gastrointestinal system: Abdomen is nondistended, soft and nontender. Normal bowel sounds heard. No organomegaly or masses appreciated. Central nervous system: Alert and oriented x 1 able to open eyes on command she has left neglect, right upper and lower extremity are 3 out of 5 Extremities: Peripheral pulses symmetrically felt.  Skin: No rashes or acute findings. Musculoskeletal system: Negative exam. Psychiatry: Pleasant and cooperative.   Labs on Admission:  Basic Metabolic Panel: Recent Labs  Lab 03/31/23 1240 03/31/23 1250  NA 135 138  K 3.5 3.4*  CL 97* 96*  CO2 24  --   GLUCOSE 203* 205*  BUN 15 18  CREATININE 2.31* 2.50*  CALCIUM 8.1*  --    Liver Function Tests: Recent Labs  Lab 03/31/23 1240  AST 24  ALT 11  ALKPHOS 83  BILITOT 0.5  PROT 7.3  ALBUMIN 3.4*   No results for input(s): "LIPASE", "AMYLASE" in the last 168 hours. No results for input(s): "AMMONIA" in the last 168 hours. CBC: Recent Labs  Lab 03/31/23 1248 03/31/23 1250  WBC 6.0  --   NEUTROABS 4.4  --   HGB 11.2* 13.3  HCT 36.3 39.0  MCV 93.1  --   PLT 166  --    Cardiac Enzymes: No results for input(s): "CKTOTAL", "CKMB", "CKMBINDEX",  "TROPONINI" in the last 168 hours.  BNP (last 3 results) No results for input(s): "PROBNP" in the last 8760 hours. CBG: Recent Labs  Lab 03/31/23 1218  GLUCAP 173*    Radiological Exams on Admission: CT ANGIO HEAD NECK W WO CM  Result Date: 03/31/2023 CLINICAL DATA:  Neuro deficit, acute, stroke suspected EXAM: CT ANGIOGRAPHY HEAD AND NECK WITH AND WITHOUT CONTRAST TECHNIQUE: Multidetector CT imaging of the head and neck was performed using the standard protocol during bolus administration of intravenous contrast. Multiplanar CT image reconstructions and MIPs were obtained to evaluate the vascular anatomy. Carotid stenosis measurements (when applicable) are obtained utilizing NASCET criteria, using the distal internal carotid diameter as the denominator. RADIATION DOSE REDUCTION: This exam was performed according to the departmental dose-optimization program which includes automated exposure control, adjustment of the mA and/or kV according to patient size and/or use of iterative reconstruction technique. CONTRAST:  75mL OMNIPAQUE IOHEXOL 350 MG/ML SOLN COMPARISON:  CT Head 07/22/22 FINDINGS: CT HEAD  FINDINGS Brain: No hemorrhage. No hydrocephalus. No extra-axial fluid collection. No CT evidence of an acute cortical infarct. 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 No mass effect. No mass lesion. There is sequela of severe chronic microvascular ischemic change. Generalized volume loss without lobar predominance. Mineralization of the basal ganglia bilaterally. No contrast-enhancing lesion is visualized. Vascular: No hyperdense vessel or unexpected calcification. Skull: Normal. Negative for fracture or focal lesion. Sinuses/Orbits: No middle ear or mastoid effusion. Paranasal sinuses are clear. Bilateral lens replacement. Orbits are otherwise unremarkable. Other: None. Review of the MIP images confirms the above findings CTA NECK FINDINGS Aortic arch: Standard  branching. Imaged portion shows no evidence of aneurysm or dissection. No significant stenosis of the major arch vessel origins. Right carotid system: No evidence of dissection, stenosis (50% or greater), or occlusion. Left carotid system: No evidence of dissection, stenosis (50% or greater), or occlusion. Vertebral arteries: Codominant. No evidence of dissection, stenosis (50% or greater), or occlusion. Skeleton: Negative. Other neck: Negative. Upper chest: Right-sided central venous catheter in place with a possible small amount of catheter associated thrombus in the right SVC (series 14, image 158). Recommend further evaluation with a right upper extremity DVT ultrasound Review of the MIP images confirms the above findings CTA HEAD FINDINGS Anterior circulation: No significant stenosis, proximal occlusion, aneurysm, or vascular malformation. Posterior circulation: No significant stenosis, proximal occlusion, aneurysm, or vascular malformation. Venous sinuses: As permitted by contrast timing, patent. Anatomic variants: None Review of the MIP images confirms the above findings IMPRESSION: 1. No hemorrhage or CT evidence of an acute cortical infarct. 2. No intracranial large vessel occlusion or significant stenosis. 3. No hemodynamically significant stenosis in the neck. 4. Right-sided central venous catheter in place with a possible small amount of catheter associated thrombus in the right SVC. Recommend further evaluation with a right upper extremity DVT ultrasound. Electronically Signed   By: Lorenza Cambridge M.D.   On: 03/31/2023 13:38   DG Chest Portable 1 View  Result Date: 03/31/2023 CLINICAL DATA:  Altered mental status EXAM: PORTABLE CHEST 1 VIEW COMPARISON:  07/22/2022 FINDINGS: Perma catheter on the right with tip at the upper right atrium. Cardiomegaly. Mediastinal contours are distorted by rightward rotation, likely stable. There is no edema, consolidation, effusion, or pneumothorax. IMPRESSION: No  evidence of active disease. Electronically Signed   By: Tiburcio Pea M.D.   On: 03/31/2023 12:51    EKG: Independently reviewed.  A-fib rate controlled  Assessment/Plan Seizure (HCC) She did have history of seizure in the past, according to daughter she was taking off anticonvulsive therapy about a year ago. There were no other loss close control of sphincters no biting of the tongue. Patient had a CTA of the head showed no acute infarct no hemorrhage no large vessel occlusion no significant stenosis. Neurology was consulted recommended admission for long-term EEG. She was loaded with Keppra in the ED. During my examination she has left upper and lower extremity weakness about a 3 out of 5 left-sided neglect. She is actually in a good mood saying that we always come into the room and wake her up when she is about to catch her sleep. She is not hypoglycemic on admission Long-term EEG ordered by neurology. Will get an MRI of the brain rule out CVA this could also be Todd's paralysis.  Essential hypertension Med rec not done, she was only on apixaban and oral hypoglycemic agents as an outpatient. Will ask pharmacy to do  med rec.  Chronic atrial fibrillation: According to daughter she is no longer on anticoagulation. She is currently rate controlled. Wise pharmacy to do med rec.  Hypertensive heart disease without congestive heart failure Blood pressure stable on no antihypertensive medication. Will continue to monitor.  End-stage renal disease on hemodialysis Tuesday Thursday and Saturdays: Will let nephrology know.  Type 2 diabetes mellitus without complications (HCC) Currently on oral hypoglycemic agents at home. Will keep her on sliding scale insulin while she is in the hospital.    DVT Prophylaxis: lovenox Code Status: Full  Family Communication: None Disposition Plan: Inpatient  Time spent: 75 min    It is my clinical opinion that admission to INPATIENT is  reasonable and necessary in this 82 y.o. female multiple medical problems with past medical history of seizures who was taken off AEDs about a year ago comes in with a seizure left side hemineglect left upper extremity and lower extremity weakness admitted for seizure rule out strokes  Given the aforementioned, the predictability of an adverse outcome is felt to be significant. I expect that the patient will require at least 2 midnights in the hospital to treat this condition.  Marinda Elk MD [facility]   03/31/2023, 4:10 PM

## 2023-04-01 DIAGNOSIS — N186 End stage renal disease: Secondary | ICD-10-CM | POA: Diagnosis not present

## 2023-04-01 DIAGNOSIS — I10 Essential (primary) hypertension: Secondary | ICD-10-CM | POA: Diagnosis not present

## 2023-04-01 DIAGNOSIS — R569 Unspecified convulsions: Secondary | ICD-10-CM | POA: Diagnosis not present

## 2023-04-01 LAB — BASIC METABOLIC PANEL
Anion gap: 11 (ref 5–15)
BUN: 20 mg/dL (ref 8–23)
CO2: 26 mmol/L (ref 22–32)
Calcium: 8.4 mg/dL — ABNORMAL LOW (ref 8.9–10.3)
Chloride: 100 mmol/L (ref 98–111)
Creatinine, Ser: 2.84 mg/dL — ABNORMAL HIGH (ref 0.44–1.00)
GFR, Estimated: 16 mL/min — ABNORMAL LOW (ref >60)
Glucose, Bld: 161 mg/dL — ABNORMAL HIGH (ref 70–99)
Potassium: 3.4 mmol/L — ABNORMAL LOW (ref 3.5–5.1)
Sodium: 137 mmol/L (ref 135–145)

## 2023-04-01 LAB — CBC
HCT: 33.5 % — ABNORMAL LOW (ref 36.0–46.0)
Hemoglobin: 10.5 g/dL — ABNORMAL LOW (ref 12.0–15.0)
MCH: 28.7 pg (ref 26.0–34.0)
MCHC: 31.3 g/dL (ref 30.0–36.0)
MCV: 91.5 fL (ref 80.0–100.0)
Platelets: 159 10*3/uL (ref 150–400)
RBC: 3.66 MIL/uL — ABNORMAL LOW (ref 3.87–5.11)
RDW: 14.9 % (ref 11.5–15.5)
WBC: 7.5 10*3/uL (ref 4.0–10.5)
nRBC: 0 % (ref 0.0–0.2)

## 2023-04-01 LAB — HEPATITIS B SURFACE ANTIGEN: Hepatitis B Surface Ag: NONREACTIVE

## 2023-04-01 MED ORDER — CHLORHEXIDINE GLUCONATE CLOTH 2 % EX PADS
6.0000 | MEDICATED_PAD | Freq: Every day | CUTANEOUS | Status: DC
  Administered 2023-04-02: 6 via TOPICAL

## 2023-04-01 MED ORDER — CHLORHEXIDINE GLUCONATE CLOTH 2 % EX PADS
6.0000 | MEDICATED_PAD | Freq: Every day | CUTANEOUS | Status: DC
  Administered 2023-04-01: 6 via TOPICAL

## 2023-04-01 NOTE — Evaluation (Signed)
Physical Therapy Evaluation Patient Details Name: [name] MRN: [id number] DOB: July 12, 1940 Today's Date: 04/01/2023  History of Present Illness  82 y.o. female 11/28 experienced witnessed seizures by her daughter. When paramedics arrived she was having tonic-clonic seizures was responded to Versed brought to hospital where she was noted to have left gaze deviation and decreased movement of L UE LLE MRI of the brain showed no acute intracranial abnormality stable encephalomalacia remote nonhemorrhagic infarct PMH: ESRD, DM2, TThS dialysis previous history of seizure d/o, pulm HTN, paroxysmal afib  Clinical Impression  Pt disoriented to time, place and situation, poor historian but reports ambulation with Rollator and independence with ADLs. Pt currently limited in safe mobility by poor cognition, decreased command follow, and likely poor eyesight, in presence of decreased strength, and balance. Pt is requiring modA for bed mobility and transfers, ambulation deferred due to being hooked up to EEG, but requires modA for lateral steps along EoB. Patient will benefit from continued inpatient follow up therapy, <3 hours/day. PT will continue to follow acutely.        If plan is discharge home, recommend the following: A little help with walking and/or transfers;A lot of help with bathing/dressing/bathroom;Assistance with cooking/housework;Direct supervision/assist for medications management;Direct supervision/assist for financial management;Assist for transportation;Help with stairs or ramp for entrance;Supervision due to cognitive status   Can travel by private vehicle   No    Equipment Recommendations None recommended by PT  Recommendations for Other Services  OT consult    Functional Status Assessment Patient has had a recent decline in their functional status and demonstrates the ability to make significant improvements in function in a reasonable and predictable amount of time.      Precautions / Restrictions Precautions Precautions: Fall Precaution Comments: seizure prior to admission Restrictions Weight Bearing Restrictions: No      Mobility  Bed Mobility Overal bed mobility: Needs Assistance Bed Mobility: Supine to Sit, Sit to Supine     Supine to sit: Mod assist Sit to supine: Min assist   General bed mobility comments: modA for coming to seated at EoB, unable to scoot to EoB without assist, requires min A for returning LE to bed    Transfers Overall transfer level: Needs assistance   Transfers: Sit to/from Stand, Bed to chair/wheelchair/BSC Sit to Stand: Mod assist           General transfer comment: modA for power up to standing, needs hand over hand placement of hands on RW and bed surface to push up, increased posterior lean and support of backs of LE on EoB, meeds power up and steadying assist, needs mod physical assist and maximal multimodal cuing for lateral stepping along EoB, did not attempt up to chair as still EEG equipment  still attached.                 Balance Overall balance assessment: Needs assistance Sitting-balance support: Feet supported, Bilateral upper extremity supported Sitting balance-Leahy Scale: Poor Sitting balance - Comments: requires support at her back due to increased posterior lean Postural control: Posterior lean Standing balance support: Bilateral upper extremity supported, During functional activity Standing balance-Leahy Scale: Poor Standing balance comment: requires mod-max A for steadying standing EoB                             Pertinent Vitals/Pain Pain Assessment Pain Assessment: No/denies pain    Home Living Family/patient expects to be discharged to:: Private residence Living  Arrangements: Children Available Help at Discharge: Family;Available 24 hours/day Type of Home: House Home Access: Stairs to enter Entrance Stairs-Rails: Can reach both Entrance Stairs-Number of  Steps: 3     Home Equipment: Rollator (4 wheels) Additional Comments: set up confirmed against homeset up from 10/24    Prior Function Prior Level of Function : Patient poor historian/Family not available (no family present)             Mobility Comments: reports that she walks with Rollator ADLs Comments: reports that she does her own bathing and dressing     Extremity/Trunk Assessment   Upper Extremity Assessment Upper Extremity Assessment: Defer to OT evaluation    Lower Extremity Assessment Lower Extremity Assessment: RLE deficits/detail;LLE deficits/detail RLE Deficits / Details: lacks full flex/ext in knees, able to achieve neutral dorsiflexion, strength grossly 3+/5 RLE Sensation: history of peripheral neuropathy (unable to feel feet) RLE Coordination: decreased fine motor LLE Deficits / Details: lacks full flex/ext in knees, able to achieve neutral dorsiflexion, strength grossly 3+/5 LLE Sensation: history of peripheral neuropathy (lacks feeling in feet) LLE Coordination: decreased fine motor    Cervical / Trunk Assessment Cervical / Trunk Assessment: Kyphotic  Communication   Communication Communication: No apparent difficulties  Cognition Arousal: Alert Behavior During Therapy: Flat affect Overall Cognitive Status: Impaired/Different from baseline (no family present) Area of Impairment: Orientation, Following commands, Awareness, Problem solving                 Orientation Level: Disoriented to, Place, Time, Situation     Following Commands: Follows one step commands with increased time, Follows multi-step commands inconsistently, Follows multi-step commands with increased time   Awareness: Intellectual Problem Solving: Slow processing, Requires verbal cues, Requires tactile cues, Decreased initiation, Difficulty sequencing General Comments: knows that she is in the hospital but not why, requires increased time and instruction,        General  Comments General comments (skin integrity, edema, etc.): eyesight is very poor, difficult to discern due to decreased command follow with visual screen, benefits from hand over hand assist for placement on bed rail, unable to determine if her feet are on the ground visually        Assessment/Plan    PT Assessment Patient needs continued PT services  PT Problem List Decreased strength;Decreased range of motion;Decreased activity tolerance;Decreased balance;Decreased mobility;Decreased coordination;Decreased cognition;Impaired sensation       PT Treatment Interventions DME instruction;Gait training;Stair training;Functional mobility training;Therapeutic activities;Therapeutic exercise;Balance training;Cognitive remediation;Patient/family education;Neuromuscular re-education    PT Goals (Current goals can be found in the Care Plan section)  Acute Rehab PT Goals PT Goal Formulation: With patient Time For Goal Achievement: 04/15/23 Potential to Achieve Goals: Fair    Frequency Min 1X/week        AM-PAC PT "6 Clicks" Mobility  Outcome Measure Help needed turning from your back to your side while in a flat bed without using bedrails?: A Little Help needed moving from lying on your back to sitting on the side of a flat bed without using bedrails?: A Lot Help needed moving to and from a bed to a chair (including a wheelchair)?: A Lot Help needed standing up from a chair using your arms (e.g., wheelchair or bedside chair)?: A Lot Help needed to walk in hospital room?: Total Help needed climbing 3-5 steps with a railing? : Total 6 Click Score: 11    End of Session Equipment Utilized During Treatment: Gait belt Activity Tolerance: Patient tolerated treatment well Patient left:  in bed;with call bell/phone within reach;with bed alarm set Nurse Communication: Mobility status;Precautions PT Visit Diagnosis: Unsteadiness on feet (R26.81);Other abnormalities of gait and mobility (R26.89);Muscle  weakness (generalized) (M62.81);History of falling (Z91.81);Difficulty in walking, not elsewhere classified (R26.2);Other symptoms and signs involving the nervous system (R29.898)    Time: 5638-7564 PT Time Calculation (min) (ACUTE ONLY): 19 min   Charges:   PT Evaluation $PT Eval Moderate Complexity: 1 Mod   PT General Charges $$ ACUTE PT VISIT: 1 Visit         Bridget Ozark B. Beverely Risen PT, DPT Acute Rehabilitation Services Please use secure chat or  Call Office [phone]   Elon Alas [facility] 04/01/2023, 9:38 AM

## 2023-04-01 NOTE — Evaluation (Signed)
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   Diabetes mellitus without complication (HCC)    type 2   Dyspnea    with exertion   Hypertension    Neuromuscular disorder (HCC)    neuropathy bil feet   PAF (paroxysmal atrial fibrillation) (HCC) 08/2020   Pneumonia    Pulmonary hypertension (HCC)    Past Surgical History:  Past Surgical History:  Procedure Laterality Date   A/V FISTULAGRAM Right 07/07/2022   Procedure: A/V Fistulagram;  Surgeon: Victorino Sparrow, MD;  Location: [facility] INVASIVE CV LAB;  Service: Cardiovascular;  Laterality: Right;   A/V SHUNT INTERVENTION N/A 07/07/2022   Procedure: A/V SHUNT INTERVENTION;  Surgeon: Victorino Sparrow, MD;  Location: [facility] INVASIVE CV LAB;  Service: Cardiovascular;  Laterality: N/A;   AV FISTULA PLACEMENT Left 06/18/2021   Procedure: LEFT ARM ARTERIOVENOUS (AV) FISTULA CREATION;  Surgeon: Maeola Harman, MD;  Location: [facility] OR;  Service: Vascular;  Laterality: Left;   AV FISTULA PLACEMENT Right 01/29/2022   Procedure: RIGHT ARM BRACHIOCEPHALIC ARTERIOVENOUS (AV) FISTULA CREATION;  Surgeon: Cephus Shelling, MD;  Location: [facility] OR;  Service: Vascular;  Laterality: Right;   AV FISTULA PLACEMENT Right 08/27/2022   Procedure: RIGHT ARM FIRST STAGE BASILIC ARTERIOVENOUS (AV) FISTULA CREATION;  Surgeon: Cephus Shelling, MD;  Location: [facility] OR;  Service: Vascular;  Laterality: Right;   BASCILIC VEIN TRANSPOSITION Left 08/28/2021   Procedure: LEFT ARM BASILIC VEIN TRANSPOSITION;  Surgeon: Maeola Harman, MD;  Location: [facility] OR;  Service: Vascular;  Laterality:  Left;   BASCILIC VEIN TRANSPOSITION Right 12/29/2022   Procedure: RIGHT ARM ARTERIOVENOUS GRAFT INSERTION USING 4-33mm GORETEX STRETCH GRAFT;  Surgeon: Cephus Shelling, MD;  Location: [facility] OR;  Service: Vascular;  Laterality: Right;   CARDIOVERSION N/A 01/14/2021   Procedure: CARDIOVERSION;  Surgeon: Yates Decamp, MD;  Location: [facility] ENDOSCOPY;  Service: Cardiovascular;  Laterality: N/A;   IR FLUORO GUIDE CV LINE RIGHT  06/15/2021   IR US GUIDE VASC ACCESS RIGHT  06/15/2021   LIGATION OF ARTERIOVENOUS  FISTULA Right 08/27/2022   Procedure: LIGATE EXISTING RIGHT ARM BRACHIOCEPHALIC ARTERIOVENOUS FISTULA;  Surgeon: Cephus Shelling, MD;  Location: [facility] OR;  Service: Vascular;  Laterality: Right;   LIGATION OF ARTERIOVENOUS  FISTULA Right 12/29/2022   Procedure: LIGATION OF RIGHT ARM BASILIC ARTERIOVENOUS  FISTULA;  Surgeon: Cephus Shelling, MD;  Location: [facility] OR;  Service: Vascular;  Laterality: Right;   TONSILLECTOMY     HPI:  Pt is an 82 yo female admitted with seizure like activity. MRI negative for acute changes but did show more remote findings (encephalomalacia R frontal lobe, infarct R parietal lobe, infarct L cerebellum, advanced atrophy and diffuse white matter disease). Previous swallow eval in 2022 demonstrated oral holding which pt said she had been told to use as a swallowing strategy. Otherwise, no overt signs of dysphagia or aspiration noted. PMH includes: ESRD, h/o seizures, DMII, afib, PNA    Assessment / Plan / Recommendation  Clinical Impression  Pt's swallowing function appears to be grossly functional, although note that she does not have her dentures at the moment. She  says she eats without them at home, but avoids certain things that are hard. She ate a saltine cracker with mastication that seemed to be appropriate and full oral clearance, needing a liquid wash but initiating the request with Mod I. No overt s/s of aspiration are observed but she does have frequent eructation.  Per her description, Dys 3 (mech soft) diet seems to be the most consistent with what she eats at home without her dentures. SLP will start this with thin liquids and f/u at least briefly. Pt would benefit from having assistance during meals considering difficulty seeing items on tray with SLP. SLP Visit Diagnosis: Dysphagia, unspecified (R13.10)    Aspiration Risk  Mild aspiration risk    Diet Recommendation Dysphagia 3 (Mech soft);Thin liquid    Liquid Administration via: Cup;Straw Medication Administration: Whole meds with liquid Supervision: Staff to assist with self feeding Compensations: Slow rate;Small sips/bites;Follow solids with liquid Postural Changes: Seated upright at 90 degrees;Remain upright for at least 30 minutes after po intake    Other  Recommendations Oral Care Recommendations: Oral care BID    Recommendations for follow up therapy are one component of a multi-disciplinary discharge planning process, led by the attending physician.  Recommendations may be updated based on patient status, additional functional criteria and insurance authorization.  Follow up Recommendations No SLP follow up      Assistance Recommended at Discharge    Functional Status Assessment Patient has had a recent decline in their functional status and demonstrates the ability to make significant improvements in function in a reasonable and predictable amount of time.  Frequency and Duration min 1 x/week  1 week       Prognosis Prognosis for improved oropharyngeal function: Good      Swallow Study   General HPI: Pt is an 82 yo female admitted with seizure like activity. MRI negative for acute changes but did show more remote findings (encephalomalacia R frontal lobe, infarct R parietal lobe, infarct L cerebellum, advanced atrophy and diffuse white matter disease). Previous swallow eval in 2022 demonstrated oral holding which pt said she had been told to use as a swallowing strategy.  Otherwise, no overt signs of dysphagia or aspiration noted. PMH includes: ESRD, h/o seizures, DMII, afib, PNA Type of Study: Bedside Swallow Evaluation Previous Swallow Assessment: see HPI Diet Prior to this Study: NPO Temperature Spikes Noted: No Respiratory Status: Room air History of Recent Intubation: No Behavior/Cognition: Alert;Cooperative Oral Cavity Assessment: Within Functional Limits Oral Care Completed by SLP: No Oral Cavity - Dentition: Edentulous;Dentures, not available (pt says she eats sometimes without her dentures) Vision: Impaired for self-feeding Self-Feeding Abilities: Needs assist Patient Positioning: Upright in bed Baseline Vocal Quality: Normal Volitional Cough: Strong Volitional Swallow: Able to elicit    Oral/Motor/Sensory Function Overall Oral Motor/Sensory Function: Within functional limits   Ice Chips Ice chips: Not tested   Thin Liquid Thin Liquid: Within functional limits Presentation: Cup;Self Fed;Straw    Nectar Thick Nectar Thick Liquid: Not tested   Honey Thick Honey Thick Liquid: Not tested   Puree Puree: Within functional limits Presentation: Spoon   Solid     Solid: Within functional limits Presentation: Self Fed      Mahala Menghini., M.A. CCC-SLP Acute Rehabilitation Services Office [phone]  Secure chat preferred  04/01/2023,10:19 AM

## 2023-04-01 NOTE — Progress Notes (Signed)
EEG D/C'd. No skin break down noted and [facility] was notified.

## 2023-04-01 NOTE — Procedures (Addendum)
[name]  MRN: [id number]  Epilepsy Attending: Charlsie Quest  Referring Physician/Provider: Erick Blinks, MD  Duration: 04/01/2023 0003 to 04/01/2023 0930  Patient history: a 82 y.o. female with hx of DM2, Pafibb not on AC, pulm HTN, seizures and supposed to be on Keppra but was discontinued in dec 2023 who was brought in by EMS for seizure like activity. Got 5mg  of Versed with resolution of clinical seizure but still persistent L gaze and L sided weakness. EEG to evaluate for seizure  Level of alertness: Awake, asleep  AEDs during EEG study: LEV  Technical aspects: This EEG study was done with scalp electrodes positioned according to the 10-20 International system of electrode placement. Electrical activity was reviewed with band pass filter of 1-70Hz , sensitivity of 7 uV/mm, display speed of 54mm/sec with a 60Hz  notched filter applied as appropriate. EEG data were recorded continuously and digitally stored.  Video monitoring was available and reviewed as appropriate.  Description: The posterior dominant rhythm consists of 9 Hz activity of moderate voltage (25-35 uV) seen predominantly in posterior head regions, symmetric and reactive to eye opening and eye closing. Sleep was characterized by vertex waves, sleep spindles (12 to 14 Hz), maximal frontocentral region. EEG showed continuous generalized and lateralized right hemisphere 3 to 6 Hz theta-delta slowing. Hyperventilation and photic stimulation were not performed.     ABNORMALITY - Continuous slow, generalized and lateralized right hemisphere   IMPRESSION: This study is  suggestive of cortical dysfunction arising from right hemisphere likely secondary to underlying stroke, post-ictal state. Additionally there is moderate diffuse encephalopathy. No seizures or epileptiform discharges were seen throughout the recording.  Derell Bruun Annabelle Harman

## 2023-04-01 NOTE — Consult Note (Signed)
ESRD Consult Note  Reason for consult: ESRD, provision of dialysis  Assessment/Recommendations:  ESRD -outpatient HD orders: [facility]. TTS. 4.0 hrs. EDW: 50 kg. F180. TDC. Flow rates: 400/autoflow 1.5. 3k, 2.5cal. Heparin: 1500 units bolus. Meds: Mircera every 2 weeks (last dose 11/27), venofer 50mg  qweekly (last dose 11/21), calcitriol 1.0 mcg three times per week -last outpt HD Wed (holiday sched) -HD planned for tomorrow per her TTS sched  Seizure -per primary and neuro  Volume/ hypertension  -UF as tolerated  Anemia of Chronic Kidney Disease Hemoglobin 10.5. Not due for ESA, monitor for now -Transfuse PRN for Hgb <7  Secondary Hyperparathyroidism/Hyperphosphatemia - not on binders as an outpatient, chk phos, will resume calcitriol   DM2 -per primary  Anthony Sar, MD [facility]  History of Present Illness: [name] is a/an 82 y.o. female with a past medical history of ESRD, DM2, previous history of seizure d/o, pulm HTN, paroxysmal afib who presents with witnessed seizure. Tonic clonic, responded to versed. Neuro consulte, started on keppra. CTA head and neck negative for any acute abnormality however did show a possible small amount of catheter associated thrombus in the right SVC. MRI brain negative. Patient seen and examined bedside. Currently undergoing EEG. No complaints currently. She reports that she hasn't had issues with dialysis, reports that her last treatment went fine. Denies any issues with her catheter. Denies chest pain, SOB. She does report that her legs swell quickly (chronic issue).   Medications:  Current Facility-Administered Medications  Medication Dose Route Frequency Provider Last Rate Last Admin   acetaminophen (TYLENOL) tablet 650 mg  650 mg Oral Q6H PRN Marinda Elk, MD       Or   acetaminophen (TYLENOL) suppository 650 mg  650 mg Rectal Q6H PRN Marinda Elk, MD       enoxaparin (LOVENOX) injection 30 mg   30 mg Subcutaneous Q24H Marinda Elk, MD   30 mg at 03/31/23 2035   levETIRAcetam (KEPPRA) 250 mg in sodium chloride 0.9 % 100 mL IVPB  250 mg Intravenous BID Erick Blinks, MD       polyethylene glycol (MIRALAX / GLYCOLAX) packet 17 g  17 g Oral Daily PRN Marinda Elk, MD         ALLERGIES Amlodipine  MEDICAL HISTORY Past Medical History:  Diagnosis Date   CHF (congestive heart failure) (HCC)    Chronic kidney disease    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   Diabetes mellitus without complication (HCC)    type 2   Dyspnea    with exertion   Hypertension    Neuromuscular disorder (HCC)    neuropathy bil feet   PAF (paroxysmal atrial fibrillation) (HCC) 08/2020   Pneumonia    Pulmonary hypertension (HCC)      SOCIAL HISTORY Social History   Socioeconomic History   Marital status: Divorced    Spouse name: Not on file   Number of children: 5   Years of education: Not on file   Highest education level: Not on file  Occupational History   Not on file  Tobacco Use   Smoking status: Never    Passive exposure: Never   Smokeless tobacco: Never  Vaping Use   Vaping status: Never Used  Substance and Sexual Activity   Alcohol use: No   Drug use: No   Sexual activity: Not Currently    Birth control/protection: Post-menopausal  Other Topics Concern   Not on file  Social History Narrative  4 living children   Social Determinants of Corporate investment banker Strain: Not on file  Food Insecurity: Not on file  Transportation Needs: Not on file  Physical Activity: Not on file  Stress: Not on file  Social Connections: Not on file  Intimate Partner Violence: Not on file     FAMILY HISTORY Family History  Family history unknown: Yes     Review of Systems: 12 systems were reviewed and negative except per HPI  Physical Exam: Vitals:   04/01/23 0031 04/01/23 0346  BP: 135/81 138/75  Pulse: 88 79  Resp: 18 18  Temp: 97.7 F (36.5 C) 97.8 F (36.6  C)  SpO2: 100% 100%   No intake/output data recorded. No intake or output data in the 24 hours ending 04/01/23 0704 General: tired-appearing, no acute distress, laying flat in bed HEENT: anicteric sclera, MMM CV: normal rate, no murmurs, no edema Lungs: bilateral chest rise, normal wob, cta bl Abd: soft, non-tender, non-distended Msk: no sig edema b/l LEs Neuro: tired appearing but interactive, awake/alert Dialysis access: right [facility]  Test Results Reviewed Lab Results  Component Value Date   NA 137 04/01/2023   K 3.4 (L) 04/01/2023   CL 100 04/01/2023   CO2 26 04/01/2023   BUN 20 04/01/2023   CREATININE 2.84 (H) 04/01/2023   CALCIUM 8.4 (L) 04/01/2023   ALBUMIN 3.4 (L) 03/31/2023   PHOS 2.1 (L) 07/23/2022    I have reviewed relevant outside healthcare records

## 2023-04-01 NOTE — Evaluation (Signed)
Occupational Therapy Evaluation Patient Details Name: [name] MRN: [id number] DOB: 05-31-1940 Today's Date: 04/01/2023   History of Present Illness 82 y.o. female 11/28 experienced witnessed seizures by her daughter. When paramedics arrived she was having tonic-clonic seizures was responded to Versed brought to hospital where she was noted to have left gaze deviation and decreased movement of L UE LLE MRI of the brain showed no acute intracranial abnormality stable encephalomalacia remote nonhemorrhagic infarct PMH: ESRD, DM2, TThS dialysis previous history of seizure d/o, pulm HTN, paroxysmal afib   Clinical Impression   Entered room with both IVs pulled out and blood on gown/sheet  - nsg made aware. Pt incontinent of BM and total A to clean up. Attempted to call daughter to confirm PLOF however No answer. Pt states she walked independently with a RW and was able to care for herself. Overall max A with mobility and max to total A with ADL tasks due to below listed deficits. Patient will benefit from continued inpatient follow up therapy, <3 hours/day to maximize functional level of independence. Acute OT to follow.       If plan is discharge home, recommend the following: Two people to help with walking and/or transfers;A lot of help with bathing/dressing/bathroom;Assistance with feeding;Direct supervision/assist for medications management;Direct supervision/assist for financial management;Assist for transportation;Help with stairs or ramp for entrance    Functional Status Assessment  Patient has had a recent decline in their functional status and demonstrates the ability to make significant improvements in function in a reasonable and predictable amount of time.  Equipment Recommendations  BSC/3in1    Recommendations for Other Services       Precautions / Restrictions Precautions Precautions: Fall Precaution Comments: seizure prior to admission Restrictions Weight Bearing  Restrictions: No      Mobility Bed Mobility Overal bed mobility: Needs Assistance Bed Mobility: Supine to Sit, Sit to Supine     Supine to sit: Mod assist Sit to supine: Mod assist        Transfers Overall transfer level: Needs assistance   Transfers: Sit to/from Stand, Bed to chair/wheelchair/BSC Sit to Stand: Mod assist           General transfer comment: significant posterior lean      Balance Overall balance assessment: Needs assistance Sitting-balance support: Feet supported, Bilateral upper extremity supported Sitting balance-Leahy Scale: Poor Sitting balance - Comments: requires support at her back due to increased posterior lean Postural control: Posterior lean Standing balance support: Bilateral upper extremity supported, During functional activity Standing balance-Leahy Scale: Poor Standing balance comment: requires mod-max A for steadying standing EoB                           ADL either performed or assessed with clinical judgement   ADL Overall ADL's : Needs assistance/impaired Eating/Feeding: Minimal assistance   Grooming: Minimal assistance   Upper Body Bathing: Moderate assistance   Lower Body Bathing: Maximal assistance;Sit to/from stand   Upper Body Dressing : Maximal assistance   Lower Body Dressing: Total assistance       Toileting- Clothing Manipulation and Hygiene: Total assistance (incontinent of BM)               Vision Baseline Vision/History: 1 Wears glasses Patient Visual Report: No change from baseline Vision Assessment?: Vision impaired- to be further tested in functional context Additional Comments: does not appear to see as well on the L     Perception Perception: Impaired Preception  Impairment Details: Inattention/Neglect (placing cup on top of potatoes)     Praxis         Pertinent Vitals/Pain Pain Assessment Pain Assessment: No/denies pain     Extremity/Trunk Assessment Upper Extremity  Assessment Upper Extremity Assessment: Generalized weakness;Right hand dominant (using B hands)   Lower Extremity Assessment Lower Extremity Assessment: Defer to PT evaluation RLE Deficits / Details: lacks full flex/ext in knees, able to achieve neutral dorsiflexion, strength grossly 3+/5 RLE Sensation:  (unable to feel feet) RLE Coordination: decreased fine motor LLE Deficits / Details: lacks full flex/ext in knees, able to achieve neutral dorsiflexion, strength grossly 3+/5 LLE Sensation:  (lacks feeling in feet) LLE Coordination: decreased fine motor   Cervical / Trunk Assessment Cervical / Trunk Assessment: Kyphotic   Communication Communication Communication: No apparent difficulties   Cognition Arousal: Alert Behavior During Therapy: Flat affect Overall Cognitive Status: Impaired/Different from baseline (no family present) Area of Impairment: Orientation, Following commands, Awareness, Problem solving, Attention, Memory, Safety/judgement                 Orientation Level: Disoriented to, Place, Time, Situation Current Attention Level: Sustained Memory: Decreased short-term memory Following Commands: Follows one step commands with increased time   Awareness: Intellectual Problem Solving: Slow processing, Requires verbal cues, Requires tactile cues, Decreased initiation, Difficulty sequencing       General Comments  eyesight is very poor, difficult to discern due to decreased command follow with visual screen, benefits from hand over hand assist for placement on bed rail, unable to determine if her feet are on the ground visually    Exercises     Shoulder Instructions      Home Living Family/patient expects to be discharged to:: Private residence Living Arrangements: Children Available Help at Discharge: Family;Available 24 hours/day Type of Home: House Home Access: Stairs to enter [employer] of Steps: 3 Entrance Stairs-Rails: Can reach both Home  Layout: Able to live on main level with bedroom/bathroom     Bathroom Shower/Tub: Producer, television/film/video: Standard     Home Equipment: Rollator (4 wheels)   Additional Comments: set up confirmed against homeset up from 10/24      Prior Functioning/Environment Prior Level of Function : Patient poor historian/Family not available (no family present)             Mobility Comments: reports that she walks with Rollator ADLs Comments: reports that she does her own bathing and dressing        OT Problem List: Decreased strength;Decreased range of motion;Decreased activity tolerance;Impaired balance (sitting and/or standing);Impaired vision/perception;Decreased coordination;Decreased cognition;Decreased safety awareness;Decreased knowledge of use of DME or AE      OT Treatment/Interventions: Self-care/ADL training;Therapeutic exercise;Neuromuscular education;DME and/or AE instruction;Therapeutic activities;Cognitive remediation/compensation;Visual/perceptual remediation/compensation;Patient/family education;Balance training    OT Goals(Current goals can be found in the care plan section) Acute Rehab OT Goals Patient Stated Goal: none stated OT Goal Formulation: Patient unable to participate in goal setting Time For Goal Achievement: 04/15/23 Potential to Achieve Goals: Fair  OT Frequency: Min 1X/week    Co-evaluation              AM-PAC OT "6 Clicks" Daily Activity     Outcome Measure Help from another person eating meals?: A Little Help from another person taking care of personal grooming?: A Little Help from another person toileting, which includes using toliet, bedpan, or urinal?: Total Help from another person bathing (including washing, rinsing, drying)?: A Lot Help from another person  to put on and taking off regular upper body clothing?: A Lot Help from another person to put on and taking off regular lower body clothing?: Total 6 Click Score: 12   End  of Session Equipment Utilized During Treatment: Gait belt;Rolling walker (2 wheels) Nurse Communication: Mobility status  Activity Tolerance: Patient tolerated treatment well Patient left: in bed;with call bell/phone within reach;with bed alarm set (chair position)  OT Visit Diagnosis: Unsteadiness on feet (R26.81);Other abnormalities of gait and mobility (R26.89);Muscle weakness (generalized) (M62.81);Low vision, both eyes (H54.2);Other symptoms and signs involving the nervous system (R29.898);Other symptoms and signs involving cognitive function                Time: 3086-5784 OT Time Calculation (min): 34 min Charges:  OT General Charges $OT Visit: 1 Visit OT Evaluation $OT Eval Moderate Complexity: 1 Mod OT Treatments $Self Care/Home Management : 8-22 mins  Luisa Dago, OT/L   Acute OT Clinical Specialist Acute Rehabilitation Services Pager [phone] Office [phone]   [facility] 04/01/2023, 11:27 AM

## 2023-04-01 NOTE — Progress Notes (Signed)
NEUROLOGY CONSULT FOLLOW UP NOTE   Date of service: April 01, 2023 [name] MRN:  [id number] DOB:  22-Oct-1940  Brief HPI  [name] is a 82 y.o. female with hx of DM2, Pafibb not on AC, pulm HTN, seizures and supposed to be on Keppra but was discontinued in dec 2023 who was brought in by EMS for seizure like activity. Got 5mg  of Versed with resolution of clinical seizure but still persistent L gaze and L sided weakness. Was given Keppra load in the ED with resolution of gaze deviation. MRI Brain w/o contrast with no stroke or other acute abnormality.  Plaved on cEEG overnight to monitor for any subclinical seizure.   Etiology of her seizures I suspect is likely taken off Keppra despite clear indication to continue.  Interval Hx/subjective   Left sided weakness and extinction improved. MRI brain with no stroke.  Vitals   Vitals:   03/31/23 1831 03/31/23 2026 04/01/23 0031 04/01/23 0346  BP: (!) 146/80 (!) 147/75 135/81 138/75  Pulse: (!) 101 84 88 79  Resp: 15 14 18 18   Temp: 99.8 F (37.7 C) 98.9 F (37.2 C) 97.7 F (36.5 C) 97.8 F (36.6 C)  TempSrc: Axillary Oral Oral Oral  SpO2: 100% 100% 100% 100%  Weight:         Body mass index is 25.75 kg/m.  Physical Exam   General: Laying comfortably in bed; in no acute distress.  HENT: Normal oropharynx and mucosa. Normal external appearance of ears and nose.  Neck: Supple, no pain or tenderness  CV: No JVD. No peripheral edema.  Pulmonary: Symmetric Chest rise. Normal respiratory effort.  Abdomen: Soft to touch, non-tender.  Ext: No cyanosis, edema, or deformity  Skin: No rash. Normal palpation of skin.   Musculoskeletal: Normal digits and nails by inspection. No clubbing.   Neurologic Examination  Mental status/Cognition: slightly drowsy but also waking her up from sleep, oriented to self, place, but not to month. Oriented to year, poor attention.  Speech/language: Fluent, comprehension intact,  object naming intact, repetition intact.  Cranial nerves:   CN II Pupils equal and reactive to light, no VF deficits    CN III,IV,VI EOM intact, no gaze preference or deviation, no nystagmus    CN V normal sensation in V1, V2, and V3 segments bilaterally    CN VII no asymmetry, no nasolabial fold flattening    CN VIII normal hearing to speech    CN IX & X normal palatal elevation, no uvular deviation    CN XI 5/5 head turn and 5/5 shoulder shrug bilaterally    CN XII midline tongue protrusion    Motor:  Muscle bulk: poor, tone normal Mvmt Root Nerve  Muscle Right Left Comments  SA C5/6 Ax Deltoid     EF C5/6 Mc Biceps 4+ 4+   EE C6/7/8 Rad Triceps 4+ 4+   WF C6/7 Med FCR     WE C7/8 PIN ECU     F Ab C8/T1 U ADM/FDI 4+ 4+   HF L1/2/3 Fem Illopsoas 4 4   KE L2/3/4 Fem Quad     DF L4/5 D Peron Tib Ant     PF S1/2 Tibial Grc/Sol      Sensation:  Light touch Intact throughout   Pin prick    Temperature    Vibration   Proprioception    Coordination/Complex Motor:  - Finger to Nose intact BL - Heel to shin unable to get her to do. -  Rapid alternating movement are slowed BL - Gait: deferred.   Medications  Current Facility-Administered Medications:    acetaminophen (TYLENOL) tablet 650 mg, 650 mg, Oral, Q6H PRN **OR** acetaminophen (TYLENOL) suppository 650 mg, 650 mg, Rectal, Q6H PRN, Marinda Elk, MD   enoxaparin (LOVENOX) injection 30 mg, 30 mg, Subcutaneous, Q24H, Marinda Elk, MD, 30 mg at 03/31/23 2035   levETIRAcetam (KEPPRA) 250 mg in sodium chloride 0.9 % 100 mL IVPB, 250 mg, Intravenous, BID, Erick Blinks, MD   polyethylene glycol (MIRALAX / GLYCOLAX) packet 17 g, 17 g, Oral, Daily PRN, Marinda Elk, MD [facility]   CBC:  Recent Labs  Lab 03/31/23 1248 03/31/23 1250 04/01/23 0507  WBC 6.0  --  7.5  NEUTROABS 4.4  --   --   HGB 11.2* 13.3 10.5*  HCT 36.3 39.0 33.5*  MCV 93.1  --  91.5  PLT 166  --  159     Basic Metabolic Panel:  Lab Results  Component Value Date   NA 137 04/01/2023   K 3.4 (L) 04/01/2023   CO2 26 04/01/2023   GLUCOSE 161 (H) 04/01/2023   BUN 20 04/01/2023   CREATININE 2.84 (H) 04/01/2023   CALCIUM 8.4 (L) 04/01/2023   GFRNONAA 16 (L) 04/01/2023   GFRAA 38 (L) 08/09/2019   Lipid Panel:  Lab Results  Component Value Date   LDLCALC 63 01/31/2022   HgbA1c:  Lab Results  Component Value Date   HGBA1C 6.1 (H) 07/23/2022   Urine Drug Screen: No results found for: "LABOPIA", "COCAINSCRNUR", "LABBENZ", "AMPHETMU", "THCU", "LABBARB"  Alcohol Level No results found for: "ETH" INR  Lab Results  Component Value Date   INR 1.0 03/31/2023   APTT  Lab Results  Component Value Date   APTT 32 04/24/2021   AED levels: No results found for: "PHENYTOIN", "ZONISAMIDE", "LAMOTRIGINE", "LEVETIRACETA"  CT Head without contrast(Personally reviewed): CTH was negative for a large hypodensity concerning for a large territory infarct or hyperdensity concerning for an ICH  CT angio Head and Neck with contrast(Personally reviewed): No LVO  MRI Brain(Personally reviewed): No acute stroke.  cEEG:  pending  Assessment  [name] is a 82 y.o. female with hx of DM2, Pafibb not on AC, pulm HTN, seizures and supposed to be on Keppra but was discontinued in dec 2023 who was brought in by EMS for seizure like activity. Got 5mg  of Versed with resolution of clinical seizure but still persistent L gaze and L sided weakness. Was given Keppra load in the ED with resolution of gaze deviation. MRI Brain w/o contrast with no stroke or other acute abnormality.  Exam today with resolution of L gaze and left sided weakness.  Recommendations  - continue Keppra 250mg  BID - Discontinue LTM EEG. - We will signoff. - follow up with neurology outpatient. ______________________________________________________________________   Signed, Erick Blinks, MD [facility]

## 2023-04-01 NOTE — Plan of Care (Signed)

## 2023-04-01 NOTE — Progress Notes (Addendum)
LTM EEG hooked up and running - no initial skin breakdown - push button tested - [facility] not monitored , will switch out EEG units, due to current unit not supporting ethernet.

## 2023-04-01 NOTE — Progress Notes (Signed)
[facility] PROGRESS NOTE    Progress Note  [name]  GNF:[id number] DOB: 08-Sep-1940 DOA: 03/31/2023 PCP: Norm Salt, PA     Brief Narrative:   [name] is an 82 y.o. female past medical history of end-stage renal disease on hemodialysis Tuesday Thursday and Saturdays previous history of seizures who was taking off her AEDs about a year ago, diabetes mellitus type 2 with a hemoglobin A1c of 6.1 comes in for witnessed seizures.  Assessment/Plan:   Seizure [facility]) Previously she was on Keppra 250 twice a day. 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 CT angio of the head and neck showed no acute CVA no hemorrhage no large vessel occlusion. Lower term EEG in process. 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 Neurology was consulted Ativan for breakthrough seizures. They also agree with Keppra.  Essential hypertension: Blood pressure stable, mild is about 130s.  Med rec still not done. Asked pharmacy to.  Chronic atrial fibrillation: According to the daughter she is no longer anticoagulation. She is currently rate controlled.  End-stage renal disease on hemodialysis Tuesday Thursday and Saturdays: Nephrology has been notified.  Diabetes mellitus type 2 without complications She was not hypoglycemic on admission, last A1c was 6 Blood glucose mildly elevated.  DVT prophylaxis: lovenox Family Communication:none Status is: Inpatient Remains inpatient appropriate because: Seizure disorder    Code Status:     Code Status Orders  (From admission, onward)           Start     Ordered   03/31/23 1624  Full code  Continuous       Question:  By:  Answer:  Consent: discussion documented in EHR   03/31/23 1628           Code Status History     Date Active Date Inactive Code Status Order ID Comments User Context    07/22/2022 1300 07/23/2022 1812 Full Code [id number]  Emeline General, MD ED   07/07/2022 1010 07/07/2022 1929 Full Code [id number]  Victorino Sparrow, MD Inpatient   01/30/2022 0546 02/06/2022 0339 Full Code [id number]  John Giovanni, MD ED   06/12/2021 1655 06/19/2021 2324 Full Code [id number]  Burnadette Pop, MD ED   05/22/2021 0022 05/28/2021 2206 Full Code [id number]  Eduard Clos, MD ED   04/25/2021 0151 05/02/2021 1900 Full Code [id number]  Carollee Herter, DO ED   04/24/2021 2147 04/25/2021 0151 Full Code [id number]  Carollee Herter, DO ED   09/06/2020 0524 09/08/2020 2352 Full Code [id number]  Chotiner, Claudean Severance, MD ED      Advance Directive Documentation    Flowsheet Row Most Recent Value  Type of Advance Directive Healthcare Power of Attorney  Pre-existing out of facility DNR order (yellow form or pink MOST form) --  "MOST" Form in Place? --         IV Access:   Peripheral IV   Procedures and diagnostic studies:   MR BRAIN WO CONTRAST  Result Date: 03/31/2023 CLINICAL DATA:  Neuro deficit, acute, stroke suspected. Seizure like activity. EXAM: MRI HEAD WITHOUT CONTRAST TECHNIQUE: Multiplanar, multiecho pulse sequences of the brain and surrounding structures were obtained without intravenous contrast. COMPARISON:  CT angio head and neck 03/31/2023.  MR head 02/01/2022. FINDINGS: Brain: Remote encephalomalacia of the anterior right frontal lobe is stable. Advanced atrophy and diffuse white matter disease is present otherwise. A remote nonhemorrhagic infarct of  the right parietal lobe is stable. No acute infarct, hemorrhage, or mass lesion is present. The ventricles are proportionate to the degree of atrophy. No significant extraaxial fluid collection is present. 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 Brainstem and cerebellum are otherwise within normal limits. The internal auditory canals are within normal limits. A relatively empty sella is present. Vascular: Flow is  present in the major intracranial arteries. Skull and upper cervical spine: The craniocervical junction is normal. Upper cervical spine is within normal limits. Marrow signal is unremarkable. Sinuses/Orbits: The paranasal sinuses and mastoid air cells are clear. Bilateral lens replacements are noted. Globes and orbits are otherwise unremarkable. IMPRESSION: 1. No acute intracranial abnormality or significant interval change. 2. 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 3. 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 4. 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 5. Advanced atrophy and diffuse white matter disease likely reflects the sequela of chronic microvascular ischemia. Electronically Signed   By: Marin Roberts M.D.   On: 03/31/2023 18:01   CT ANGIO HEAD NECK W WO CM  Result Date: 03/31/2023 CLINICAL DATA:  Neuro deficit, acute, stroke suspected EXAM: CT ANGIOGRAPHY HEAD AND NECK WITH AND WITHOUT CONTRAST TECHNIQUE: Multidetector CT imaging of the head and neck was performed using the standard protocol during bolus administration of intravenous contrast. Multiplanar CT image reconstructions and MIPs were obtained to evaluate the vascular anatomy. Carotid stenosis measurements (when applicable) are obtained utilizing NASCET criteria, using the distal internal carotid diameter as the denominator. RADIATION DOSE REDUCTION: This exam was performed according to the departmental dose-optimization program which includes automated exposure control, adjustment of the mA and/or kV according to patient size and/or use of iterative reconstruction technique. CONTRAST:  75mL OMNIPAQUE IOHEXOL 350 MG/ML SOLN COMPARISON:  CT Head 07/22/22 FINDINGS: CT HEAD FINDINGS Brain: No hemorrhage. No hydrocephalus. No extra-axial fluid collection. No CT evidence of an acute cortical infarct. There are chronic infarcts in the right frontal lobe in the medial left  cerebellum. No mass effect. No mass lesion. There is sequela of severe chronic microvascular ischemic change. Generalized volume loss without lobar predominance. Mineralization of the basal ganglia bilaterally. No contrast-enhancing lesion is visualized. Vascular: No hyperdense vessel or unexpected calcification. Skull: Normal. Negative for fracture or focal lesion. Sinuses/Orbits: No middle ear or mastoid effusion. Paranasal sinuses are clear. Bilateral lens replacement. Orbits are otherwise unremarkable. Other: None. Review of the MIP images confirms the above findings CTA NECK FINDINGS Aortic arch: Standard branching. Imaged portion shows no evidence of aneurysm or dissection. No significant stenosis of the major arch vessel origins. Right carotid system: No evidence of dissection, stenosis (50% or greater), or occlusion. Left carotid system: No evidence of dissection, stenosis (50% or greater), or occlusion. Vertebral arteries: Codominant. No evidence of dissection, stenosis (50% or greater), or occlusion. Skeleton: Negative. Other neck: Negative. Upper chest: Right-sided central venous catheter in place with a possible small amount of catheter associated thrombus in the right SVC (series 14, image 158). Recommend further evaluation with a right upper extremity DVT ultrasound Review of the MIP images confirms the above findings CTA HEAD FINDINGS Anterior circulation: No significant stenosis, proximal occlusion, aneurysm, or vascular malformation. Posterior circulation: No significant stenosis, proximal occlusion, aneurysm, or vascular malformation. Venous sinuses: As permitted by contrast timing, patent. Anatomic variants: None Review of the MIP images confirms the above findings IMPRESSION: 1. No hemorrhage or CT evidence of an acute cortical infarct. 2.  No intracranial large vessel occlusion or significant stenosis. 3. No hemodynamically significant stenosis in the neck. 4. Right-sided central venous catheter  in place with a possible small amount of catheter associated thrombus in the right SVC. Recommend further evaluation with a right upper extremity DVT ultrasound. Electronically Signed   By: Lorenza Cambridge M.D.   On: 03/31/2023 13:38   DG Chest Portable 1 View  Result Date: 03/31/2023 CLINICAL DATA:  Altered mental status EXAM: PORTABLE CHEST 1 VIEW COMPARISON:  07/22/2022 FINDINGS: Perma catheter on the right with tip at the upper right atrium. Cardiomegaly. Mediastinal contours are distorted by rightward rotation, likely stable. There is no edema, consolidation, effusion, or pneumothorax. IMPRESSION: No evidence of active disease. Electronically Signed   By: Tiburcio Pea M.D.   On: 03/31/2023 12:51     Medical Consultants:   None.   Subjective:    [name] no complaints  Objective:    Vitals:   03/31/23 1831 03/31/23 2026 04/01/23 0031 04/01/23 0346  BP: (!) 146/80 (!) 147/75 135/81 138/75  Pulse: (!) 101 84 88 79  Resp: 15 14 18 18   Temp: 99.8 F (37.7 C) 98.9 F (37.2 C) 97.7 F (36.5 C) 97.8 F (36.6 C)  TempSrc: Axillary Oral Oral Oral  SpO2: 100% 100% 100% 100%  Weight:       SpO2: 100 % O2 Flow Rate (L/min): 3 L/min  No intake or output data in the 24 hours ending 04/01/23 0726 Filed Weights   03/31/23 1229  Weight: 68 kg    Exam: General exam: In no acute distress. Respiratory system: Good air movement and clear to auscultation. Cardiovascular system: S1 & S2 heard, RRR. No JVD.  Gastrointestinal system: Abdomen is nondistended, soft and nontender.  Extremities: No pedal edema. Skin: No rashes, lesions or ulcers Psychiatry: Judgment and insight appear intact.   Data Reviewed:    Labs: Basic Metabolic Panel: Recent Labs  Lab 03/31/23 1240 03/31/23 1250 04/01/23 0507  NA 135 138 137  K 3.5 3.4* 3.4*  CL 97* 96* 100  CO2 24  --  26  GLUCOSE 203* 205* 161*  BUN 15 18 20   CREATININE 2.31* 2.50* 2.84*  CALCIUM 8.1*  --  8.4*    GFR Estimated Creatinine Clearance: 14.7 mL/min (A) (by C-G formula based on SCr of 2.84 mg/dL (H)). Liver Function Tests: Recent Labs  Lab 03/31/23 1240  AST 24  ALT 11  ALKPHOS 83  BILITOT 0.5  PROT 7.3  ALBUMIN 3.4*   No results for input(s): "LIPASE", "AMYLASE" in the last 168 hours. No results for input(s): "AMMONIA" in the last 168 hours. Coagulation profile Recent Labs  Lab 03/31/23 1240  INR 1.0   COVID-19 Labs  No results for input(s): "DDIMER", "FERRITIN", "LDH", "CRP" in the last 72 hours.  Lab Results  Component Value Date   SARSCOV2NAA NEGATIVE 07/22/2022   SARSCOV2NAA NEGATIVE 06/12/2021   SARSCOV2NAA NEGATIVE 05/21/2021   SARSCOV2NAA NEGATIVE 04/24/2021    CBC: Recent Labs  Lab 03/31/23 1248 03/31/23 1250 04/01/23 0507  WBC 6.0  --  7.5  NEUTROABS 4.4  --   --   HGB 11.2* 13.3 10.5*  HCT 36.3 39.0 33.5*  MCV 93.1  --  91.5  PLT 166  --  159   Cardiac Enzymes: No results for input(s): "CKTOTAL", "CKMB", "CKMBINDEX", "TROPONINI" in the last 168 hours. BNP (last 3 results) No results for input(s): "PROBNP" in the last 8760 hours. CBG: Recent Labs  Lab 03/31/23 1218  GLUCAP 173*   D-Dimer: No results for input(s): "DDIMER" in the last 72 hours. Hgb A1c: No results for input(s): "HGBA1C" in the last 72 hours. Lipid Profile: No results for input(s): "CHOL", "HDL", "LDLCALC", "TRIG", "CHOLHDL", "LDLDIRECT" in the last 72 hours. Thyroid function studies: No results for input(s): "TSH", "T4TOTAL", "T3FREE", "THYROIDAB" in the last 72 hours.  Invalid input(s): "FREET3" Anemia work up: No results for input(s): "VITAMINB12", "FOLATE", "FERRITIN", "TIBC", "IRON", "RETICCTPCT" in the last 72 hours. Sepsis Labs: Recent Labs  Lab 03/31/23 1248 04/01/23 0507  WBC 6.0 7.5   Microbiology No results found for this or any previous visit (from the past 240 hour(s)).   Medications:    enoxaparin (LOVENOX) injection  30 mg Subcutaneous Q24H    Continuous Infusions:  levETIRAcetam        LOS: 1 day   Marinda Elk  [facility]  04/01/2023, 7:26 AM

## 2023-04-01 NOTE — Plan of Care (Signed)
?  Problem: Coping: ?Goal: Level of anxiety will decrease ?Outcome: Progressing ?  ?Problem: Safety: ?Goal: Ability to remain free from injury will improve ?Outcome: Progressing ?  ?

## 2023-04-02 DIAGNOSIS — I10 Essential (primary) hypertension: Secondary | ICD-10-CM | POA: Diagnosis not present

## 2023-04-02 DIAGNOSIS — R569 Unspecified convulsions: Secondary | ICD-10-CM | POA: Diagnosis not present

## 2023-04-02 DIAGNOSIS — E119 Type 2 diabetes mellitus without complications: Secondary | ICD-10-CM | POA: Diagnosis not present

## 2023-04-02 LAB — CBC
HCT: 36.9 % (ref 36.0–46.0)
Hemoglobin: 11.7 g/dL — ABNORMAL LOW (ref 12.0–15.0)
MCH: 29.5 pg (ref 26.0–34.0)
MCHC: 31.7 g/dL (ref 30.0–36.0)
MCV: 92.9 fL (ref 80.0–100.0)
Platelets: 154 10*3/uL (ref 150–400)
RBC: 3.97 MIL/uL (ref 3.87–5.11)
RDW: 15 % (ref 11.5–15.5)
WBC: 7.8 10*3/uL (ref 4.0–10.5)
nRBC: 0 % (ref 0.0–0.2)

## 2023-04-02 LAB — RENAL FUNCTION PANEL
Albumin: 3.2 g/dL — ABNORMAL LOW (ref 3.5–5.0)
Anion gap: 13 (ref 5–15)
BUN: 24 mg/dL — ABNORMAL HIGH (ref 8–23)
CO2: 21 mmol/L — ABNORMAL LOW (ref 22–32)
Calcium: 8.6 mg/dL — ABNORMAL LOW (ref 8.9–10.3)
Chloride: 101 mmol/L (ref 98–111)
Creatinine, Ser: 3.32 mg/dL — ABNORMAL HIGH (ref 0.44–1.00)
GFR, Estimated: 13 mL/min — ABNORMAL LOW (ref >60)
Glucose, Bld: 150 mg/dL — ABNORMAL HIGH (ref 70–99)
Phosphorus: 5.4 mg/dL — ABNORMAL HIGH (ref 2.5–4.6)
Potassium: 3.7 mmol/L (ref 3.5–5.1)
Sodium: 135 mmol/L (ref 135–145)

## 2023-04-02 LAB — HEPATITIS B SURFACE ANTIBODY, QUANTITATIVE: Hep B S AB Quant (Post): 1762 m[IU]/mL

## 2023-04-02 MED ORDER — LIDOCAINE-PRILOCAINE 2.5-2.5 % EX CREA: 1.0000 | TOPICAL_CREAM | CUTANEOUS | Status: DC | PRN

## 2023-04-02 MED ORDER — ANTICOAGULANT SODIUM CITRATE 4% (200MG/5ML) IV SOLN: 5.0000 mL | Status: DC | PRN

## 2023-04-02 MED ORDER — HEPARIN SODIUM (PORCINE) 1000 UNIT/ML DIALYSIS
1000.0000 [IU] | INTRAMUSCULAR | Status: DC | PRN
  Administered 2023-04-02: 3200 [IU]
  Filled 2023-04-02: qty 1

## 2023-04-02 MED ORDER — LEVETIRACETAM 250 MG PO TABS: 250.0000 mg | ORAL_TABLET | Freq: Two times a day (BID) | ORAL | 1 refills | Status: AC

## 2023-04-02 MED ORDER — HEPARIN SODIUM (PORCINE) 1000 UNIT/ML DIALYSIS: 1000.0000 [IU] | INTRAMUSCULAR | Status: DC | PRN

## 2023-04-02 MED ORDER — PENTAFLUOROPROP-TETRAFLUOROETH EX AERO: 1.0000 | INHALATION_SPRAY | CUTANEOUS | Status: DC | PRN

## 2023-04-02 MED ORDER — LIDOCAINE HCL (PF) 1 % IJ SOLN: 5.0000 mL | INTRAMUSCULAR | Status: DC | PRN

## 2023-04-02 MED ORDER — ALTEPLASE 2 MG IJ SOLR: 2.0000 mg | Freq: Once | INTRAMUSCULAR | Status: DC | PRN

## 2023-04-02 NOTE — Discharge Summary (Signed)
Physician Discharge Summary  [name] NWG:[id number] DOB: 23-Jun-1940 DOA: 03/31/2023  PCP: Norm Salt, PA  Admit date: 03/31/2023 Discharge date: 04/02/2023  Admitted From: Home Disposition:  Home  Recommendations for Outpatient Follow-up:  Follow up with PCP in 1-2 weeks Please obtain BMP/CBC in one week Home Health:Yes Equipment/Devices:None  Discharge Condition:Stable CODE STATUS:Full Diet recommendation: Heart Healthy  Brief/Interim Summary: 82 y.o. female past medical history of end-stage renal disease on hemodialysis Tuesday Thursday and Saturdays previous history of seizures who was taking off her AEDs about a year ago, diabetes mellitus type 2 with a hemoglobin A1c of 6.1 comes in for witnessed seizures.   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 MRI of the brain showed no acute findings. CT angio of the head and neck showed no large cerebral hemorrhage. EEG showed no evidence of seizures. Neurology was consulted who agree with Keppra and Ativan for breakthrough seizures. PT evaluated the patient, related she needed to go to skilled nursing facility. The patient refused adamantly. I tried to explain to her the risk and benefits but she related she wanted to go home. Her son was also adamant and told the nurse that he was taking her home. Patient and family were unreasonable about the plan the patient was very demanding and not wanting to discuss further care.  Essential hypertension: Blood pressure is well-controlled she was not taking any antihypertensive medication at home.  Chronic atrial fibrillation: She is currently rate controlled, she relates her primary care  doctor took her off Eliquis, she has not refilled this in months.  End-stage renal disease on hemodialysis: Nephrology was consulted she was dialyzed in the hospital.  Diabetes mellitus type 2 without complications: A1c of 6.0.   Discharge Instructions  Discharge Instructions     Diet - low sodium heart healthy   Complete by: As directed    Increase activity slowly   Complete by: As directed       Allergies as of 04/02/2023       Reactions   Amlodipine Other (See Comments)   Patient has LE edema to this med at higher doses (10 mg)        Medication List     STOP taking these medications    Eliquis 5 MG Tabs tablet Generic drug: apixaban   fluticasone 50 MCG/ACT nasal spray Commonly known as: FLONASE   gabapentin 100 MG capsule Commonly known as: NEURONTIN   Insulin Lispro-aabc 100 UNIT/ML Soln   linagliptin 5 MG Tabs tablet Commonly known as: TRADJENTA       TAKE these medications    levETIRAcetam 250 MG tablet Commonly known as: KEPPRA Take 250 mg by mouth 2 (two) times daily. What changed: Another medication with the same name was added. Make sure you understand how and when to take each.   levETIRAcetam 250 MG tablet Commonly known as: Keppra Take 1 tablet (250 mg total) by mouth 2 (two) times daily. What changed: You were already taking a medication with the same name, and this prescription was added. Make sure you understand how and when to take each.        Allergies  Allergen Reactions   Amlodipine Other (See Comments)    Patient has LE edema  to this med at higher doses (10 mg)    Consultations: Neurology   Procedures/Studies: Overnight EEG with video  Result Date: 04/01/2023 Terry Quest, MD     04/01/2023 11:01 AM [name] MRN: [id number] Epilepsy Attending: Charlsie Marquez Referring Physician/Provider: Erick Blinks, MD Duration: 04/01/2023 0003 to 04/01/2023 0930 Patient history: a 82 y.o. female  with hx of DM2, Pafibb not on AC, pulm HTN, seizures and supposed to be on Keppra but was discontinued in dec 2023 who was brought in by EMS for seizure like activity. Got 5mg  of Versed with resolution of clinical seizure but still persistent L gaze and L sided weakness. EEG to evaluate for seizure Level of alertness: Awake, asleep AEDs during EEG study: LEV Technical aspects: This EEG study was done with scalp electrodes positioned according to the 10-20 International system of electrode placement. Electrical activity was reviewed with band pass filter of 1-70Hz , sensitivity of 7 uV/mm, display speed of 90mm/sec with a 60Hz  notched filter applied as appropriate. EEG data were recorded continuously and digitally stored.  Video monitoring was available and reviewed as appropriate. Description: The posterior dominant rhythm consists of 9 Hz activity of moderate voltage (25-35 uV) seen predominantly in posterior head regions, symmetric and reactive to eye opening and eye closing. Sleep was characterized by vertex waves, sleep spindles (12 to 14 Hz), maximal frontocentral region. EEG showed continuous generalized and lateralized right hemisphere 3 to 6 Hz theta-delta slowing. Hyperventilation and photic stimulation were not performed.   ABNORMALITY - Continuous slow, generalized and lateralized right hemisphere IMPRESSION: This study is  suggestive of cortical dysfunction arising from right hemisphere likely secondary to underlying stroke, post-ictal state. Additionally there is moderate diffuse encephalopathy. No seizures or epileptiform discharges were seen throughout the recording. [name]   MR BRAIN WO CONTRAST  Result Date: 03/31/2023 CLINICAL DATA:  Neuro deficit, acute, stroke suspected. Seizure like activity. EXAM: MRI HEAD WITHOUT CONTRAST TECHNIQUE: Multiplanar, multiecho pulse sequences of the brain and surrounding structures were obtained without intravenous contrast. COMPARISON:  CT angio head  and neck 03/31/2023.  MR head 02/01/2022. FINDINGS: Brain: Remote encephalomalacia of the anterior right frontal lobe is stable. Advanced atrophy and diffuse white matter disease is present otherwise. 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 No acute infarct, hemorrhage, or mass lesion is present. The ventricles are proportionate to the degree of atrophy. No significant extraaxial fluid collection is present. 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 Brainstem and cerebellum are otherwise within normal limits. The internal auditory canals are within normal limits. A relatively empty sella is present. Vascular: Flow is present in the major intracranial arteries. Skull and upper cervical spine: The craniocervical junction is normal. Upper cervical spine is within normal limits. Marrow signal is unremarkable. Sinuses/Orbits: The paranasal sinuses and mastoid air cells are clear. Bilateral lens replacements are noted. Globes and orbits are otherwise unremarkable. IMPRESSION: 1. No acute intracranial abnormality or significant interval change. 2. 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 3. 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 4. 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 5. Advanced atrophy and diffuse white matter disease likely reflects the sequela of chronic microvascular ischemia. Electronically Signed   By: Marin Roberts M.D.   On: 03/31/2023 18:01   CT ANGIO HEAD NECK W WO CM  Result Date: 03/31/2023 CLINICAL DATA:  Neuro deficit, acute, stroke suspected EXAM: CT ANGIOGRAPHY  HEAD AND NECK WITH AND WITHOUT CONTRAST TECHNIQUE: Multidetector CT imaging of the head and neck was performed using the standard protocol during bolus administration of intravenous contrast. Multiplanar CT image reconstructions and MIPs were obtained to evaluate the vascular anatomy. Carotid stenosis  measurements (when applicable) are obtained utilizing NASCET criteria, using the distal internal carotid diameter as the denominator. RADIATION DOSE REDUCTION: This exam was performed according to the departmental dose-optimization program which includes automated exposure control, adjustment of the mA and/or kV according to patient size and/or use of iterative reconstruction technique. CONTRAST:  75mL OMNIPAQUE IOHEXOL 350 MG/ML SOLN COMPARISON:  CT Head 07/22/22 FINDINGS: CT HEAD FINDINGS Brain: No hemorrhage. No hydrocephalus. No extra-axial fluid collection. No CT evidence of an acute cortical infarct. 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 No mass effect. No mass lesion. There is sequela of severe chronic microvascular ischemic change. Generalized volume loss without lobar predominance. Mineralization of the basal ganglia bilaterally. No contrast-enhancing lesion is visualized. Vascular: No hyperdense vessel or unexpected calcification. Skull: Normal. Negative for fracture or focal lesion. Sinuses/Orbits: No middle ear or mastoid effusion. Paranasal sinuses are clear. Bilateral lens replacement. Orbits are otherwise unremarkable. Other: None. Review of the MIP images confirms the above findings CTA NECK FINDINGS Aortic arch: Standard branching. Imaged portion shows no evidence of aneurysm or dissection. No significant stenosis of the major arch vessel origins. Right carotid system: No evidence of dissection, stenosis (50% or greater), or occlusion. Left carotid system: No evidence of dissection, stenosis (50% or greater), or occlusion. Vertebral arteries: Codominant. No evidence of dissection, stenosis (50% or greater), or occlusion. Skeleton: Negative. Other neck: Negative. Upper chest: Right-sided central venous catheter in place with a possible small amount of catheter associated thrombus in the right SVC (series 14, image 158). Recommend further evaluation with a  right upper extremity DVT ultrasound Review of the MIP images confirms the above findings CTA HEAD FINDINGS Anterior circulation: No significant stenosis, proximal occlusion, aneurysm, or vascular malformation. Posterior circulation: No significant stenosis, proximal occlusion, aneurysm, or vascular malformation. Venous sinuses: As permitted by contrast timing, patent. Anatomic variants: None Review of the MIP images confirms the above findings IMPRESSION: 1. No hemorrhage or CT evidence of an acute cortical infarct. 2. No intracranial large vessel occlusion or significant stenosis. 3. No hemodynamically significant stenosis in the neck. 4. Right-sided central venous catheter in place with a possible small amount of catheter associated thrombus in the right SVC. Recommend further evaluation with a right upper extremity DVT ultrasound. Electronically Signed   By: Lorenza Cambridge M.D.   On: 03/31/2023 13:38   DG Chest Portable 1 View  Result Date: 03/31/2023 CLINICAL DATA:  Altered mental status EXAM: PORTABLE CHEST 1 VIEW COMPARISON:  07/22/2022 FINDINGS: Perma catheter on the right with tip at the upper right atrium. Cardiomegaly. Mediastinal contours are distorted by rightward rotation, likely stable. There is no edema, consolidation, effusion, or pneumothorax. IMPRESSION: No evidence of active disease. Electronically Signed   By: Tiburcio Pea M.D.   On: 03/31/2023 12:51   (Echo, Carotid, EGD, Colonoscopy, ERCP)    Subjective: No complaints  Discharge Exam: Vitals:   04/02/23 1330 04/02/23 1507  BP: 111/64 124/65  Pulse: 88 96  Resp: 18 17  Temp: 98.3 F (36.8 C) 98.1 F (36.7 C)  SpO2: 94% 100%   Vitals:   04/02/23 1155 04/02/23 1202 04/02/23 1330 04/02/23 1507  BP: 102/67 123/66 111/64 124/65  Pulse: 94 88 88  96  Resp: 15 17 18 17   Temp:  98.3 F (36.8 C) 98.3 F (36.8 C) 98.1 F (36.7 C)  TempSrc:   Oral Oral  SpO2: 100% 100% 94% 100%  Weight:  50.8 kg      General: Pt is  alert, awake, not in acute distress Cardiovascular: RRR, S1/S2 +, no rubs, no gallops Respiratory: CTA bilaterally, no wheezing, no rhonchi Abdominal: Soft, NT, ND, bowel sounds + Extremities: no edema, no cyanosis    The results of significant diagnostics from this hospitalization (including imaging, microbiology, ancillary and laboratory) are listed below for reference.     Microbiology: No results found for this or any previous visit (from the past 240 hour(s)).   Labs: BNP (last 3 results) No results for input(s): "BNP" in the last 8760 hours. Basic Metabolic Panel: Recent Labs  Lab 03/31/23 1240 03/31/23 1250 04/01/23 0507 04/02/23 0716  NA 135 138 137 135  K 3.5 3.4* 3.4* 3.7  CL 97* 96* 100 101  CO2 24  --  26 21*  GLUCOSE 203* 205* 161* 150*  BUN 15 18 20  24*  CREATININE 2.31* 2.50* 2.84* 3.32*  CALCIUM 8.1*  --  8.4* 8.6*  PHOS  --   --   --  5.4*   Liver Function Tests: Recent Labs  Lab 03/31/23 1240 04/02/23 0716  AST 24  --   ALT 11  --   ALKPHOS 83  --   BILITOT 0.5  --   PROT 7.3  --   ALBUMIN 3.4* 3.2*   No results for input(s): "LIPASE", "AMYLASE" in the last 168 hours. No results for input(s): "AMMONIA" in the last 168 hours. CBC: Recent Labs  Lab 03/31/23 1248 03/31/23 1250 04/01/23 0507 04/02/23 0716  WBC 6.0  --  7.5 7.8  NEUTROABS 4.4  --   --   --   HGB 11.2* 13.3 10.5* 11.7*  HCT 36.3 39.0 33.5* 36.9  MCV 93.1  --  91.5 92.9  PLT 166  --  159 154   Cardiac Enzymes: No results for input(s): "CKTOTAL", "CKMB", "CKMBINDEX", "TROPONINI" in the last 168 hours. BNP: Invalid input(s): "POCBNP" CBG: Recent Labs  Lab 03/31/23 1218  GLUCAP 173*   D-Dimer No results for input(s): "DDIMER" in the last 72 hours. Hgb A1c No results for input(s): "HGBA1C" in the last 72 hours. Lipid Profile No results for input(s): "CHOL", "HDL", "LDLCALC", "TRIG", "CHOLHDL", "LDLDIRECT" in the last 72 hours. Thyroid function studies No results  for input(s): "TSH", "T4TOTAL", "T3FREE", "THYROIDAB" in the last 72 hours.  Invalid input(s): "FREET3" Anemia work up No results for input(s): "VITAMINB12", "FOLATE", "FERRITIN", "TIBC", "IRON", "RETICCTPCT" in the last 72 hours. Urinalysis    Component Value Date/Time   COLORURINE AMBER (A) 05/27/2022 2057   APPEARANCEUR HAZY (A) 05/27/2022 2057   LABSPEC 1.018 05/27/2022 2057   PHURINE 6.0 05/27/2022 2057   GLUCOSEU NEGATIVE 05/27/2022 2057   HGBUR SMALL (A) 05/27/2022 2057   BILIRUBINUR NEGATIVE 05/27/2022 2057   KETONESUR NEGATIVE 05/27/2022 2057   PROTEINUR 100 (A) 05/27/2022 2057   NITRITE NEGATIVE 05/27/2022 2057   LEUKOCYTESUR MODERATE (A) 05/27/2022 2057   Sepsis Labs Recent Labs  Lab 03/31/23 1248 04/01/23 0507 04/02/23 0716  WBC 6.0 7.5 7.8   Microbiology No results found for this or any previous visit (from the past 240 hour(s)).   Time coordinating discharge: Over 35 minutes  SIGNED:   Marinda Elk, MD  [facility] 04/02/2023, 4:37 PM Pager  If 7PM-7AM, please contact night-coverage [email or web address] Password [email or web address]

## 2023-04-02 NOTE — TOC Initial Note (Signed)
Transition of Care [facility]) - Initial/Assessment Note    Patient Details  Name: [name] MRN: [id number] Date of Birth: Aug 02, 1940  Transition of [facility]) CM/SW Contact:    [name], LCSWA Phone Number: 04/02/2023, 10:54 AM  Clinical Narrative:                  CSW received consult for possible SNF placement at time of discharge. Due to patients current orientation CSW tried to call patients [name] unable to leave voicemail,voicemail full. CSW Lvm for patients son [name]. CSW awaiting call back to discuss PT recommendation of SNF placement at time of discharge for patient.CSW to continue to follow and assist with discharge planning needs.   Expected Discharge Plan: Home/Self Care     Patient Goals and CMS Choice            Expected Discharge Plan and Services                                              Prior Living Arrangements/Services                       Activities of Daily Living      Permission Sought/Granted                  Emotional Assessment              Admission diagnosis:  Seizure [facility]) [R56.9] Patient Active Problem List   Diagnosis Date Noted   Seizure (HCC) 03/31/2023   Pain due to onychomycosis of toenails of both feet 01/12/2023   Hypoglycemia due to insulin 07/22/2022   Hypoglycemia 07/22/2022   Paroxysmal atrial fibrillation (HCC) 04/07/2022   Acute CVA (cerebrovascular accident) (HCC) 01/30/2022   Seizures (HCC) 01/30/2022   UTI (urinary tract infection) 01/30/2022   Acute urinary retention 01/30/2022   ESRD (end stage renal disease) (HCC) 01/30/2022   Acute encephalopathy 01/30/2022   Thrombocytopenia (HCC), chronic 06/18/2021   Pyuria 06/13/2021   Acute on chronic diastolic CHF (congestive heart failure) (HCC) 06/12/2021   Normocytic anemia 06/12/2021   CKD (chronic kidney disease), stage V (HCC) 05/22/2021   ARF (acute renal failure) (HCC) 05/21/2021   Malnutrition of moderate  degree 04/27/2021   Confusion    Wheezing    Bacteremia 04/25/2021   Sepsis with acute organ dysfunction without septic shock (HCC) 04/24/2021   Acute kidney injury superimposed on chronic kidney disease (HCC) 04/24/2021   Acute cystitis without hematuria 04/24/2021   Influenza A 09/06/2020   Persistent atrial fibrillation (HCC) 09/06/2020   DM (diabetes mellitus), type 2 with renal complications (HCC) 09/06/2020   Prolonged QT interval 09/06/2020   Abnormal gait 06/18/2020   Heart failure (HCC) 06/18/2020   Hyperglycemia due to type 2 diabetes mellitus (HCC) 06/18/2020   Hypertensive heart disease without congestive heart failure 06/18/2020   Mixed incontinence 06/18/2020   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HCC) 06/18/2020   Pure hypercholesterolemia 06/18/2020   Stage 3 chronic kidney disease (HCC) 06/18/2020   Type 2 diabetes mellitus without complications (HCC) 06/18/2020   Vitamin D deficiency 06/18/2020   Essential hypertension 05/08/2019   Hyperlipidemia 05/08/2019   Bilateral lower extremity edema 05/08/2019   Failed hearing screening 02/02/2018   Impacted cerumen of right ear 02/02/2018   Otalgia, right  02/02/2018   PCP:  Norm Salt, PA Pharmacy:   [facility] [phone] [place] Otto, [place] - [address] AT [facility] [address] [facility] [address] [place] [address] Phone: [phone] Fax: [phone]  [facility] [address] [place] [address] Phone: [phone] Fax: [phone]     Social Determinants of Health (SDOH) Social History: SDOH Screenings   Tobacco Use: Low Risk  (03/31/2023)   SDOH Interventions:     Readmission Risk Interventions     No data to display

## 2023-04-02 NOTE — Procedures (Signed)
I was present at this dialysis session. I have reviewed the session itself and made appropriate changes.   Filed Weights   03/31/23 1229 04/02/23 0803  Weight: 68 kg 52.8 kg    Recent Labs  Lab 04/02/23 0716  NA 135  K 3.7  CL 101  CO2 21*  GLUCOSE 150*  BUN 24*  CREATININE 3.32*  CALCIUM 8.6*  PHOS 5.4*    Recent Labs  Lab 03/31/23 1248 03/31/23 1250 04/01/23 0507 04/02/23 0716  WBC 6.0  --  7.5 7.8  NEUTROABS 4.4  --   --   --   HGB 11.2* 13.3 10.5* 11.7*  HCT 36.3 39.0 33.5* 36.9  MCV 93.1  --  91.5 92.9  PLT 166  --  159 154    Scheduled Meds:  Chlorhexidine Gluconate Cloth  6 each Topical Daily   Chlorhexidine Gluconate Cloth  6 each Topical Q0600   enoxaparin (LOVENOX) injection  30 mg Subcutaneous Q24H   Continuous Infusions:  anticoagulant sodium citrate     levETIRAcetam 250 mg (04/01/23 2150)   PRN Meds:.acetaminophen **OR** acetaminophen, alteplase, anticoagulant sodium citrate, heparin, heparin, lidocaine (PF), lidocaine-prilocaine, pentafluoroprop-tetrafluoroeth, polyethylene glycol   Anthony Sar, MD [facility] 04/02/2023, 11:28 AM

## 2023-04-02 NOTE — Progress Notes (Signed)
[facility] PROGRESS NOTE    Progress Note  [name]  ZOX:[id number] DOB: [birth year] DOA: 03/31/2023 PCP: Norm Salt, PA     Brief Narrative:   [name] is an 82 y.o. female past medical history of end-stage renal disease on hemodialysis Tuesday Thursday and Saturdays previous history of seizures who was taking off her AEDs about a year ago, diabetes mellitus type 2 with a hemoglobin A1c of 6.1 comes in for witnessed seizures.  Assessment/Plan:   Seizure (HCC)/Todd's paralysis Now Keppra 250 twice a day. MRI of the brain showed no acute intracranial abnormality.  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 CT angio of the head and neck showed no acute CVA no hemorrhage no large vessel occlusion. EEG showed no evidence of seizures. Neurology was consulted Ativan for breakthrough seizures. PT evaluated the patient will need skilled nursing facility placement.  She and I had a long conversation she is adamantly refused rehab she wants go home with home health PT. Will have PT evaluate her again.  Essential hypertension: Blood pressure stable, mild is about 130s.  Med rec still not done. Asked pharmacy to.  Chronic atrial fibrillation: According to the daughter and patient she is no longer anticoagulation. She is currently rate controlled.  End-stage renal disease on hemodialysis Tuesday Thursday and Saturdays: Nephrology has been notified.  She is on dialysis today feels better.  Diabetes mellitus type 2 without complications She was not hypoglycemic on admission, last A1c was 6 Blood glucose mildly elevated.  DVT prophylaxis: lovenox Family Communication:none Status is: Inpatient Remains inpatient appropriate because: Seizure disorder    Code Status:     Code Status Orders  (From admission, onward)           Start     Ordered   03/31/23 1624  Full code  Continuous       Question:  By:  Answer:  Consent: discussion documented in EHR   03/31/23 1628            Code Status History     Date Active Date Inactive Code Status Order ID Comments User Context   07/22/2022 1300 07/23/2022 1812 Full Code [id number]  Emeline General, MD ED   07/07/2022 1010 07/07/2022 1929 Full Code [id number]  Victorino Sparrow, MD Inpatient   01/30/2022 0546 02/06/2022 0339 Full Code [id number]  John Giovanni, MD ED   06/12/2021 1655 06/19/2021 2324 Full Code [id number]  Burnadette Pop, MD ED   05/22/2021 0022 05/28/2021 2206 Full Code [id number]  Eduard Clos, MD ED   04/25/2021 0151 05/02/2021 1900 Full Code [id number]  Carollee Herter, DO ED   04/24/2021 2147 04/25/2021 0151 Full Code [id number]  Carollee Herter, DO ED   09/06/2020 0524 09/08/2020 2352 Full Code [id number]  Chotiner, Claudean Severance, MD ED      Advance Directive Documentation    Flowsheet Row Most Recent Value  Type of Advance Directive Healthcare Power of Attorney  Pre-existing out of facility DNR order (yellow form or pink MOST form) --  "MOST" Form in Place? --         IV Access:   Peripheral IV   Procedures and diagnostic studies:   Overnight EEG with video  Result Date: 04/01/2023 Charlsie Quest, MD     04/01/2023 11:01 AM [name] MRN: [id number] Epilepsy Attending: Charlsie Quest Referring Physician/Provider: Erick Blinks, MD Duration: 04/01/2023 0003 to 04/01/2023 0930 Patient history: a 82 y.o. female with hx of DM2, Pafibb not on AC,  pulm HTN, seizures and supposed to be on Keppra but was discontinued in dec 2023 who was brought in by EMS for seizure like activity. Got 5mg  of Versed with resolution of clinical seizure but still persistent L gaze and L sided weakness. EEG to evaluate for seizure Level of alertness: Awake, asleep AEDs during EEG study: LEV Technical aspects: This EEG study was done with scalp electrodes positioned according to the 10-20 International system of electrode placement. Electrical activity was reviewed with band pass filter of 1-70Hz , sensitivity  of 7 uV/mm, display speed of 47mm/sec with a 60Hz  notched filter applied as appropriate. EEG data were recorded continuously and digitally stored.  Video monitoring was available and reviewed as appropriate. Description: The posterior dominant rhythm consists of 9 Hz activity of moderate voltage (25-35 uV) seen predominantly in posterior head regions, symmetric and reactive to eye opening and eye closing. Sleep was characterized by vertex waves, sleep spindles (12 to 14 Hz), maximal frontocentral region. EEG showed continuous generalized and lateralized right hemisphere 3 to 6 Hz theta-delta slowing. Hyperventilation and photic stimulation were not performed.   ABNORMALITY - Continuous slow, generalized and lateralized right hemisphere IMPRESSION: This study is  suggestive of cortical dysfunction arising from right hemisphere likely secondary to underlying stroke, post-ictal state. Additionally there is moderate diffuse encephalopathy. No seizures or epileptiform discharges were seen throughout the recording. Charlsie Quest   MR BRAIN WO CONTRAST  Result Date: 03/31/2023 CLINICAL DATA:  Neuro deficit, acute, stroke suspected. Seizure like activity. EXAM: MRI HEAD WITHOUT CONTRAST TECHNIQUE: Multiplanar, multiecho pulse sequences of the brain and surrounding structures were obtained without intravenous contrast. COMPARISON:  CT angio head and neck 03/31/2023.  MR head 02/01/2022. FINDINGS: Brain: Remote encephalomalacia of the anterior right frontal lobe is stable. Advanced atrophy and diffuse white matter disease is present otherwise. 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 No acute infarct, hemorrhage, or mass lesion is present. The ventricles are proportionate to the degree of atrophy. No significant extraaxial fluid collection is present. 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 Brainstem and cerebellum are otherwise within normal limits. The internal  auditory canals are within normal limits. A relatively empty sella is present. Vascular: Flow is present in the major intracranial arteries. Skull and upper cervical spine: The craniocervical junction is normal. Upper cervical spine is within normal limits. Marrow signal is unremarkable. Sinuses/Orbits: The paranasal sinuses and mastoid air cells are clear. Bilateral lens replacements are noted. Globes and orbits are otherwise unremarkable. IMPRESSION: 1. No acute intracranial abnormality or significant interval change. 2. 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 3. 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 4. 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 5. Advanced atrophy and diffuse white matter disease likely reflects the sequela of chronic microvascular ischemia. Electronically Signed   By: Marin Roberts M.D.   On: 03/31/2023 18:01   CT ANGIO HEAD NECK W WO CM  Result Date: 03/31/2023 CLINICAL DATA:  Neuro deficit, acute, stroke suspected EXAM: CT ANGIOGRAPHY HEAD AND NECK WITH AND WITHOUT CONTRAST TECHNIQUE: Multidetector CT imaging of the head and neck was performed using the standard protocol during bolus administration of intravenous contrast. Multiplanar CT image reconstructions and MIPs were obtained to evaluate the vascular anatomy. Carotid stenosis measurements (when applicable) are obtained utilizing NASCET criteria, using the distal internal carotid diameter as the denominator. RADIATION DOSE REDUCTION: This exam was performed according to the departmental  dose-optimization program which includes automated exposure control, adjustment of the mA and/or kV according to patient size and/or use of iterative reconstruction technique. CONTRAST:  75mL OMNIPAQUE IOHEXOL 350 MG/ML SOLN COMPARISON:  CT Head 07/22/22 FINDINGS: CT HEAD FINDINGS Brain: No hemorrhage. No hydrocephalus. No extra-axial fluid collection. No CT evidence of an  acute cortical infarct. 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 No mass effect. No mass lesion. There is sequela of severe chronic microvascular ischemic change. Generalized volume loss without lobar predominance. Mineralization of the basal ganglia bilaterally. No contrast-enhancing lesion is visualized. Vascular: No hyperdense vessel or unexpected calcification. Skull: Normal. Negative for fracture or focal lesion. Sinuses/Orbits: No middle ear or mastoid effusion. Paranasal sinuses are clear. Bilateral lens replacement. Orbits are otherwise unremarkable. Other: None. Review of the MIP images confirms the above findings CTA NECK FINDINGS Aortic arch: Standard branching. Imaged portion shows no evidence of aneurysm or dissection. No significant stenosis of the major arch vessel origins. Right carotid system: No evidence of dissection, stenosis (50% or greater), or occlusion. Left carotid system: No evidence of dissection, stenosis (50% or greater), or occlusion. Vertebral arteries: Codominant. No evidence of dissection, stenosis (50% or greater), or occlusion. Skeleton: Negative. Other neck: Negative. Upper chest: Right-sided central venous catheter in place with a possible small amount of catheter associated thrombus in the right SVC (series 14, image 158). Recommend further evaluation with a right upper extremity DVT ultrasound Review of the MIP images confirms the above findings CTA HEAD FINDINGS Anterior circulation: No significant stenosis, proximal occlusion, aneurysm, or vascular malformation. Posterior circulation: No significant stenosis, proximal occlusion, aneurysm, or vascular malformation. Venous sinuses: As permitted by contrast timing, patent. Anatomic variants: None Review of the MIP images confirms the above findings IMPRESSION: 1. No hemorrhage or CT evidence of an acute cortical infarct. 2. No intracranial large vessel occlusion or significant stenosis.  3. No hemodynamically significant stenosis in the neck. 4. Right-sided central venous catheter in place with a possible small amount of catheter associated thrombus in the right SVC. Recommend further evaluation with a right upper extremity DVT ultrasound. Electronically Signed   By: Lorenza Cambridge M.D.   On: 03/31/2023 13:38   DG Chest Portable 1 View  Result Date: 03/31/2023 CLINICAL DATA:  Altered mental status EXAM: PORTABLE CHEST 1 VIEW COMPARISON:  07/22/2022 FINDINGS: Perma catheter on the right with tip at the upper right atrium. Cardiomegaly. Mediastinal contours are distorted by rightward rotation, likely stable. There is no edema, consolidation, effusion, or pneumothorax. IMPRESSION: No evidence of active disease. Electronically Signed   By: Tiburcio Pea M.D.   On: 03/31/2023 12:51     Medical Consultants:   None.   Subjective:    [name] no complaints, she relates she is no longer on anticoagulation for atrial fibrillation.  Objective:    Vitals:   04/01/23 2007 04/01/23 2334 04/02/23 0348 04/02/23 0803  BP: 127/61 112/62 112/66 118/77  Pulse: 85 81 94 81  Resp: 18 17 18 12   Temp: 98 F (36.7 C) 97.8 F (36.6 C) 97.9 F (36.6 C) 98.2 F (36.8 C)  TempSrc:      SpO2: 100% 100% 99% 99%  Weight:    52.8 kg   SpO2: 99 % O2 Flow Rate (L/min): 3 L/min   Intake/Output Summary (Last 24 hours) at 04/02/2023 0815 Last data filed at 04/02/2023 0530 Gross per 24 hour  Intake 103 ml  Output 100 ml  Net 3 ml  Filed Weights   03/31/23 1229 04/02/23 0803  Weight: 68 kg 52.8 kg    Exam: General exam: In no acute distress. Respiratory system: Good air movement and clear to auscultation. Cardiovascular system: S1 & S2 heard, RRR. No JVD. Gastrointestinal system: Abdomen is nondistended, soft and nontender.  Extremities: No pedal edema. Skin: No rashes, lesions or ulcers Psychiatry: Judgement and insight appear normal. Mood & affect appropriate.   Data  Reviewed:    Labs: Basic Metabolic Panel: Recent Labs  Lab 03/31/23 1240 03/31/23 1250 04/01/23 0507 04/02/23 0716  NA 135 138 137 135  K 3.5 3.4* 3.4* 3.7  CL 97* 96* 100 101  CO2 24  --  26 21*  GLUCOSE 203* 205* 161* 150*  BUN 15 18 20  24*  CREATININE 2.31* 2.50* 2.84* 3.32*  CALCIUM 8.1*  --  8.4* 8.6*  PHOS  --   --   --  5.4*   GFR Estimated Creatinine Clearance: 11.1 mL/min (A) (by C-G formula based on SCr of 3.32 mg/dL (H)). Liver Function Tests: Recent Labs  Lab 03/31/23 1240 04/02/23 0716  AST 24  --   ALT 11  --   ALKPHOS 83  --   BILITOT 0.5  --   PROT 7.3  --   ALBUMIN 3.4* 3.2*   No results for input(s): "LIPASE", "AMYLASE" in the last 168 hours. No results for input(s): "AMMONIA" in the last 168 hours. Coagulation profile Recent Labs  Lab 03/31/23 1240  INR 1.0   COVID-19 Labs  No results for input(s): "DDIMER", "FERRITIN", "LDH", "CRP" in the last 72 hours.  Lab Results  Component Value Date   SARSCOV2NAA NEGATIVE 07/22/2022   SARSCOV2NAA NEGATIVE 06/12/2021   SARSCOV2NAA NEGATIVE 05/21/2021   SARSCOV2NAA NEGATIVE 04/24/2021    CBC: Recent Labs  Lab 03/31/23 1248 03/31/23 1250 04/01/23 0507 04/02/23 0716  WBC 6.0  --  7.5 7.8  NEUTROABS 4.4  --   --   --   HGB 11.2* 13.3 10.5* 11.7*  HCT 36.3 39.0 33.5* 36.9  MCV 93.1  --  91.5 92.9  PLT 166  --  159 154   Cardiac Enzymes: No results for input(s): "CKTOTAL", "CKMB", "CKMBINDEX", "TROPONINI" in the last 168 hours. BNP (last 3 results) No results for input(s): "PROBNP" in the last 8760 hours. CBG: Recent Labs  Lab 03/31/23 1218  GLUCAP 173*   D-Dimer: No results for input(s): "DDIMER" in the last 72 hours. Hgb A1c: No results for input(s): "HGBA1C" in the last 72 hours. Lipid Profile: No results for input(s): "CHOL", "HDL", "LDLCALC", "TRIG", "CHOLHDL", "LDLDIRECT" in the last 72 hours. Thyroid function studies: No results for input(s): "TSH", "T4TOTAL", "T3FREE",  "THYROIDAB" in the last 72 hours.  Invalid input(s): "FREET3" Anemia work up: No results for input(s): "VITAMINB12", "FOLATE", "FERRITIN", "TIBC", "IRON", "RETICCTPCT" in the last 72 hours. Sepsis Labs: Recent Labs  Lab 03/31/23 1248 04/01/23 0507 04/02/23 0716  WBC 6.0 7.5 7.8   Microbiology No results found for this or any previous visit (from the past 240 hour(s)).   Medications:    Chlorhexidine Gluconate Cloth  6 each Topical Daily   Chlorhexidine Gluconate Cloth  6 each Topical Q0600   enoxaparin (LOVENOX) injection  30 mg Subcutaneous Q24H   Continuous Infusions:  anticoagulant sodium citrate     levETIRAcetam 250 mg (04/01/23 2150)      LOS: 2 days   Marinda Elk  [facility]  04/02/2023, 8:15 AM

## 2023-04-02 NOTE — Progress Notes (Signed)
   04/02/23 1202  Vitals  Temp 98.3 F (36.8 C)  Pulse Rate 88  Resp 17  BP 123/66  SpO2 100 %  O2 Device Room Air  Weight 50.8 kg  Type of Weight Post-Dialysis  Oxygen Therapy  Patient Activity (if Appropriate) In bed  Pulse Oximetry Type Continuous  Oximetry Probe Site Changed No  Post Treatment  Dialyzer Clearance Lightly streaked  Hemodialysis Intake (mL) 0 mL  Liters Processed 84  Fluid Removed (mL) 2000 mL  Tolerated HD Treatment Yes   Received patient in bed to unit.  Alert and oriented.  Informed consent signed and in chart.   TX duration: 3.5 Patient tolerated well.  Transported back to the room  Alert, without acute distress.  Hand-off given to patient's nurse.   Access used: [facility] Access issues: lines reversed at the onset of the tx  Total UF removed: 2000 Medication(s) given: none   [facility]

## 2023-04-03 NOTE — Discharge Planning (Signed)
[facility] Patient Discharge Orders- [facility] CLINIC: [place]  Patient's name: [name] Admit/DC Dates: 03/31/2023 - 04/02/2023  Discharge Diagnoses: Seizures - Keppra resumed  Refused SNF    Recent Labs  Lab 04/02/23 0716  HGB 11.7*  K 3.7  CALCIUM 8.6*  PHOS 5.4*  ALBUMIN 3.2*    Aranesp: Given: --   Date of last dose/amount: --   PRBC's Given: -- Date/# of units: - ESA dose for discharge:  Mircera 75 mcg IV q 2 wks  Outpatient Dialysis Orders:  -Heparin: No change -EDW No change   -[place]: No change  Access intervention/Change:  --  IV Antibiotics: --  OTHER/APPTS/LABS   Completed by: Tomasa Blase PA-C   D/C Meds to be reconciled by nurse after every discharge.    Reviewed by: MD:______ RN_______

## 2023-04-04 ENCOUNTER — Encounter (HOSPITAL_COMMUNITY)
Admission: RE | Disposition: A | Discharge status: Home / Self Care | Payer: Self-pay | Source: Ambulatory Visit | Attending: Vascular Surgery

## 2023-04-04 ENCOUNTER — Telehealth: Payer: Self-pay | Admitting: Nephrology

## 2023-04-04 ENCOUNTER — Ambulatory Visit (HOSPITAL_COMMUNITY)
Admission: RE | Admit: 2023-04-04 | Discharge: 2023-04-04 | Disposition: A | Discharge status: Home / Self Care | Payer: Medicare HMO | Source: Ambulatory Visit | Attending: Vascular Surgery | Admitting: Vascular Surgery

## 2023-04-04 ENCOUNTER — Other Ambulatory Visit: Payer: Self-pay

## 2023-04-04 DIAGNOSIS — N186 End stage renal disease: Secondary | ICD-10-CM

## 2023-04-04 DIAGNOSIS — Z992 Dependence on renal dialysis: Secondary | ICD-10-CM | POA: Diagnosis not present

## 2023-04-04 DIAGNOSIS — T82898A Other specified complication of vascular prosthetic devices, implants and grafts, initial encounter: Secondary | ICD-10-CM | POA: Diagnosis not present

## 2023-04-04 HISTORY — PX: UPPER EXTREMITY VENOGRAPHY: CATH118272

## 2023-04-04 LAB — POCT I-STAT, CHEM 8
BUN: 28 mg/dL — ABNORMAL HIGH (ref 8–23)
Calcium, Ion: 1.14 mmol/L — ABNORMAL LOW (ref 1.15–1.40)
Chloride: 98 mmol/L (ref 98–111)
Creatinine, Ser: 3.7 mg/dL — ABNORMAL HIGH (ref 0.44–1.00)
Glucose, Bld: 184 mg/dL — ABNORMAL HIGH (ref 70–99)
HCT: 43 % (ref 36.0–46.0)
Hemoglobin: 14.6 g/dL (ref 12.0–15.0)
Potassium: 4 mmol/L (ref 3.5–5.1)
Sodium: 137 mmol/L (ref 135–145)
TCO2: 28 mmol/L (ref 22–32)

## 2023-04-04 SURGERY — UPPER EXTREMITY VENOGRAPHY
Anesthesia: LOCAL | Laterality: Bilateral

## 2023-04-04 MED ORDER — IODIXANOL 320 MG/ML IV SOLN
INTRAVENOUS | Status: DC | PRN
  Administered 2023-04-04: 35 mL via INTRAVENOUS

## 2023-04-04 SURGICAL SUPPLY — 4 items
KIT PV (KITS) ×1 IMPLANT
STOPCOCK MORSE 400PSI 3WAY (MISCELLANEOUS) IMPLANT
TRANSDUCER W/STOPCOCK (MISCELLANEOUS) ×1 IMPLANT
TRAY PV CATH (CUSTOM PROCEDURE TRAY) ×1 IMPLANT

## 2023-04-04 NOTE — Op Note (Signed)
    [name] MRN: [id number] DOB: Nov 12, 1940 Sex: female  04/04/2023 Pre-operative Diagnosis: ESRD on HD with failed accesses bilaterally Post-operative diagnosis:  Same Surgeon:  Daria Pastures, MD Procedure Performed:  Bilateral upper extremity and central venogram performed via bilateral IVs   Indications: [name] is a 82 year old female with ESRD on HD who has had bilateral failed upper extremity accesses.  Venogram was offered, risk and benefits were reviewed and patient elected to proceed.  Findings: Bilateral upper extremity brachial veins small and sclerotic.  Bilateral axillary veins widely patent without significant stenosis.  Bilateral innominate veins appear to be widely patent.  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   Procedure:  The patient was identified in the holding area and taken to the cath lab  The patient was then placed supine on the table and prepped and draped in the usual sterile fashion.  A time out was called.  Bilateral upper extremity venogram and central venogram was obtained which demonstrated the above findings.  Contrast: 35 cc Sedation: none    Daria Pastures MD [facility] [place] Office: [phone]

## 2023-04-04 NOTE — Telephone Encounter (Signed)
Transition of Care Contact from Inpatient Facility   Date of Discharge: 04/02/23 Date of Contact: 12//2/24 Method of contact: phone Talked to patient daughter   Patient contacted to discuss transition of care form recent hospitaliztion. Patient was admitted to [facility] from 11/28 to 04/02/23 with the discharge diagnosis of Seizure.    Medication changes were reviewed - has everything she needs.  Patient will follow up with is outpatient dialysis center 04/05/23.  Other follow up needs include none identified.   Virgina Norfolk, PA-C [employer]

## 2023-04-04 NOTE — H&P (Signed)
Patient seen and examined in preop holding.  No complaints. No changes to medication history or physical exam since last seen in clinic. After discussing the risks and benefits of bilateral UE venogram, [name] elected to proceed.   Terry Pastures MD   __________________________________________________________________________________         Postoperative Access Visit     History of Present Illness    [name] is a 82 y.o. year old female who presents with her daughter for postoperative follow-up for: right upper arm arteriovenous graft and ligation of right brachiobasilic AV fistula by Dr. Chestine Spore on 12/29/22. The patient's wounds are well healed.  The patient notes no steal symptoms.  The patient is able to complete their activities of daily living. She has history of prior left BV AV fistula that occluded and right BV fistula that was poorly functioning and ultimately was ligated at time of placement of her recent AV graft.    She currently dialyzes on TTS at right internal jugular [facility]   Physical Examination       Vitals:    02/25/23 0909  BP: (!) 94/59  Pulse: 64  Temp: 98.2 F (36.8 C)  SpO2: 95%  Weight: 140 lb (63.5 kg)  Height: 5\' 4"  (1.626 m)    Body mass index is 24.03 kg/m.   right arm Incisions are well healed, 2+ radial pulse, hand grip is 4/5, sensation in digits is  intact, no palpable thrill, no bruit can be auscultated       Medical Decision Making    [name] is a 82 y.o. year old female who presents s/p right upper arm arteriovenous graft and ligation of right brachiobasilic AV fistula by Dr. Chestine Spore on 12/29/22. The patient's wounds are well healed.  The patient notes no steal symptoms. Unfortunately her right upper arm graft is occluded on examination today. I discussed this with Dr. Chestine Spore and since she has had bilateral accesses that have failed his recommendation is a bilateral upper extremity Venogram to further evaluate her central veins  to determine next best access option - Will arrange bilateral upper extremity Venogram with Dr. Chestine Spore in the near future. We will try to schedule this on a non dialysis day     Terry Congress, PA-C [facility] [place] Office: [phone]

## 2023-04-04 NOTE — Discharge Instructions (Signed)

## 2023-04-05 ENCOUNTER — Encounter (HOSPITAL_COMMUNITY): Payer: Self-pay | Admitting: Vascular Surgery

## 2023-04-13 ENCOUNTER — Encounter (HOSPITAL_COMMUNITY): Payer: Medicare HMO

## 2023-04-13 ENCOUNTER — Ambulatory Visit: Payer: Medicare HMO | Admitting: Vascular Surgery

## 2023-05-13 ENCOUNTER — Ambulatory Visit: Payer: Medicare HMO | Admitting: Podiatry

## 2023-05-17 ENCOUNTER — Ambulatory Visit: Payer: Medicare HMO | Admitting: Vascular Surgery

## 2023-05-17 ENCOUNTER — Encounter (HOSPITAL_COMMUNITY): Payer: Medicare HMO

## 2023-05-20 ENCOUNTER — Ambulatory Visit: Payer: Medicare HMO | Admitting: Vascular Surgery

## 2023-05-20 ENCOUNTER — Ambulatory Visit (HOSPITAL_COMMUNITY): Payer: Medicare HMO

## 2023-06-02 ENCOUNTER — Emergency Department (HOSPITAL_COMMUNITY): Payer: Medicare HMO

## 2023-06-02 ENCOUNTER — Encounter (HOSPITAL_COMMUNITY): Payer: Self-pay | Admitting: Emergency Medicine

## 2023-06-02 ENCOUNTER — Other Ambulatory Visit: Payer: Self-pay

## 2023-06-02 ENCOUNTER — Emergency Department (HOSPITAL_COMMUNITY)
Admission: EM | Admit: 2023-06-02 | Discharge: 2023-06-03 | Disposition: A | Discharge status: Home / Self Care | Payer: Medicare HMO | Attending: Emergency Medicine | Admitting: Emergency Medicine

## 2023-06-02 DIAGNOSIS — I132 Hypertensive heart and chronic kidney disease with heart failure and with stage 5 chronic kidney disease, or end stage renal disease: Secondary | ICD-10-CM | POA: Insufficient documentation

## 2023-06-02 DIAGNOSIS — R404 Transient alteration of awareness: Secondary | ICD-10-CM | POA: Insufficient documentation

## 2023-06-02 DIAGNOSIS — N186 End stage renal disease: Secondary | ICD-10-CM | POA: Diagnosis not present

## 2023-06-02 DIAGNOSIS — I509 Heart failure, unspecified: Secondary | ICD-10-CM | POA: Diagnosis not present

## 2023-06-02 DIAGNOSIS — E1122 Type 2 diabetes mellitus with diabetic chronic kidney disease: Secondary | ICD-10-CM | POA: Diagnosis not present

## 2023-06-02 LAB — CBC WITH DIFFERENTIAL/PLATELET
Abs Immature Granulocytes: 0.03 10*3/uL (ref 0.00–0.07)
Basophils Absolute: 0 10*3/uL (ref 0.0–0.1)
Basophils Relative: 1 %
Eosinophils Absolute: 0.3 10*3/uL (ref 0.0–0.5)
Eosinophils Relative: 5 %
HCT: 39.7 % (ref 36.0–46.0)
Hemoglobin: 12.4 g/dL (ref 12.0–15.0)
Immature Granulocytes: 1 %
Lymphocytes Relative: 15 %
Lymphs Abs: 0.9 10*3/uL (ref 0.7–4.0)
MCH: 29 pg (ref 26.0–34.0)
MCHC: 31.2 g/dL (ref 30.0–36.0)
MCV: 93 fL (ref 80.0–100.0)
Monocytes Absolute: 0.5 10*3/uL (ref 0.1–1.0)
Monocytes Relative: 8 %
Neutro Abs: 4.1 10*3/uL (ref 1.7–7.7)
Neutrophils Relative %: 70 %
Platelets: 88 10*3/uL — ABNORMAL LOW (ref 150–400)
RBC: 4.27 MIL/uL (ref 3.87–5.11)
RDW: 15.7 % — ABNORMAL HIGH (ref 11.5–15.5)
WBC: 5.8 10*3/uL (ref 4.0–10.5)
nRBC: 0 % (ref 0.0–0.2)

## 2023-06-02 LAB — COMPREHENSIVE METABOLIC PANEL
ALT: 8 U/L (ref 0–44)
AST: 20 U/L (ref 15–41)
Albumin: 4 g/dL (ref 3.5–5.0)
Alkaline Phosphatase: 93 U/L (ref 38–126)
Anion gap: 13 (ref 5–15)
BUN: 5 mg/dL — ABNORMAL LOW (ref 8–23)
CO2: 25 mmol/L (ref 22–32)
Calcium: 9.5 mg/dL (ref 8.9–10.3)
Chloride: 97 mmol/L — ABNORMAL LOW (ref 98–111)
Creatinine, Ser: 1.61 mg/dL — ABNORMAL HIGH (ref 0.44–1.00)
GFR, Estimated: 32 mL/min — ABNORMAL LOW (ref >60)
Glucose, Bld: 205 mg/dL — ABNORMAL HIGH (ref 70–99)
Potassium: 3.8 mmol/L (ref 3.5–5.1)
Sodium: 135 mmol/L (ref 135–145)
Total Bilirubin: 0.7 mg/dL (ref 0.0–1.2)
Total Protein: 8.6 g/dL — ABNORMAL HIGH (ref 6.5–8.1)

## 2023-06-02 NOTE — ED Triage Notes (Signed)
Pt BIB [facility] with reports of hypotension and "acting different" per the family. Pt reports that 2 days ago she fell and hit her head. Denies pain at this time.

## 2023-06-02 NOTE — ED Provider Triage Note (Signed)
Emergency Medicine Provider Triage Evaluation Note  [name] , a 83 y.o. female  was evaluated in triage.  Pt complains of hypotension, recent head injury.  Review of Systems  Positive: ?AMS (per family /ems) Negative: Headache, chest pain, abdominal pain, vomiting, fevers  Physical Exam  BP (!) 119/55 (BP Location: Left Arm)   Pulse 75   Temp 98.2 F (36.8 C) (Oral)   Resp 16   SpO2 100%  Gen:   Awake, no distress   Resp:  Normal effort  MSK:   Moves extremities without difficulty  Other:  Responds to questions appropriately   Medical Decision Making  Medically screening exam initiated at 5:59 PM.  Appropriate orders placed.  [name] was informed that the remainder of the evaluation will be completed by another provider, this initial triage assessment does not replace that evaluation, and the importance of remaining in the ED until their evaluation is complete.     Lonell Grandchild, MD 06/02/23 1800

## 2023-06-02 NOTE — ED Provider Notes (Signed)
[place] [facility]GENC[facility] DEPARTMENT AT [facility] Provider Note   CSN: [id number] Arrival date & time: 06/02/23  1739     History  Chief Complaint  Patient presents with   Hypotension    Adelai Achey is a 83 y.o. female.  HPI     This is an 83 year old female who presents with concern for hypotension.  Per family she was sitting eating tonight when she had an episode where she appeared dazed.  Family reports that she was unresponsive for approximately 10 minutes.  However her eyes were open and she just was not responding to them.  She did not fully lose consciousness.  After becoming more aware, she was at her baseline.  No seizure activity noted.  No recent illnesses.  Patient does not have any complaints.  Family reports she is at her baseline.  EMS was called and noted her to be hypotensive.  Patient does report fall 2 days ago.  Home Medications Prior to Admission medications   Medication Sig Start Date End Date Taking? Authorizing Provider  levETIRAcetam (KEPPRA) 250 MG tablet Take 250 mg by mouth 2 (two) times daily. Patient not taking: Reported on 04/01/2023    [provider]  levETIRAcetam (KEPPRA) 250 MG tablet Take 1 tablet (250 mg total) by mouth 2 (two) times daily. 04/02/23   Marinda Elk, MD      Allergies    Amlodipine    Review of Systems   Review of Systems  Constitutional:  Negative for fever.       Altered mental status  Respiratory:  Negative for shortness of breath.   Cardiovascular:  Negative for chest pain.  Gastrointestinal:  Negative for abdominal pain.  All other systems reviewed and are negative.   Physical Exam Updated Vital Signs BP 122/67   Pulse 78   Temp 98 F (36.7 C) (Oral)   Resp 15   SpO2 100%  Physical Exam Vitals and nursing note reviewed.  Constitutional:      Appearance: She is well-developed.     Comments: Elderly, frail, no acute distress  HENT:     Head: Normocephalic and atraumatic.   Eyes:     Pupils: Pupils are equal, round, and reactive to light.  Cardiovascular:     Rate and Rhythm: Normal rate and regular rhythm.     Heart sounds: Normal heart sounds.  Pulmonary:     Effort: Pulmonary effort is normal. No respiratory distress.     Breath sounds: No wheezing.  Abdominal:     Palpations: Abdomen is soft.  Musculoskeletal:     Cervical back: Neck supple.  Skin:    General: Skin is warm and dry.  Neurological:     Mental Status: She is alert and oriented to person, place, and time.     Comments: Fluent speech, cranial nerves II to XII intact, 5 out of 5 strength in all 4 extremities  Psychiatric:        Mood and Affect: Mood normal.     ED Results / Procedures / Treatments   Labs (all labs ordered are listed, but only abnormal results are displayed) Labs Reviewed  COMPREHENSIVE METABOLIC PANEL - Abnormal; Notable for the following components:      Result Value   Chloride 97 (*)    Glucose, Bld 205 (*)    BUN 5 (*)    Creatinine, Ser 1.61 (*)    Total Protein 8.6 (*)    GFR, Estimated 32 (*)  All other components within normal limits  CBC WITH DIFFERENTIAL/PLATELET - Abnormal; Notable for the following components:   RDW 15.7 (*)    Platelets 88 (*)    All other components within normal limits  URINALYSIS, ROUTINE W REFLEX MICROSCOPIC    EKG EKG Interpretation Date/Time:  Thursday June 02 2023 23:47:37 EST Ventricular Rate:  82 PR Interval:    QRS Duration:  120 QT Interval:  421 QTC Calculation: 492 R Axis:   178  Text Interpretation: Atrial fibrillation IRBBB and LPFB Lateral infarct, age indeterminate Anteroseptal infarct, age indeterminate Confirmed by Ross Marcus ([id number]) on 06/03/2023 12:11:50 AM  Radiology CT Head Wo Contrast Result Date: 06/02/2023 CLINICAL DATA:  Recent falls with headache and neck pain, initial encounter EXAM: CT HEAD WITHOUT CONTRAST CT CERVICAL SPINE WITHOUT CONTRAST TECHNIQUE: Multidetector CT imaging  of the head and cervical spine was performed following the standard protocol without intravenous contrast. Multiplanar CT image reconstructions of the cervical spine were also generated. RADIATION DOSE REDUCTION: This exam was performed according to the departmental dose-optimization program which includes automated exposure control, adjustment of the mA and/or kV according to patient size and/or use of iterative reconstruction technique. COMPARISON:  03/31/2023 FINDINGS: CT HEAD FINDINGS Brain: No evidence of acute infarction, hemorrhage, hydrocephalus, extra-axial collection or mass lesion/mass effect. Chronic atrophic and ischemic changes are noted. 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 These changes are stable from the prior study. Vascular: No hyperdense vessel or unexpected calcification. Skull: Normal. Negative for fracture or focal lesion. Sinuses/Orbits: No acute finding. Other: None. CT CERVICAL SPINE FINDINGS Alignment: Within normal limits. Skull base and vertebrae: 7 cervical segments are well visualized. Vertebral body height is well maintained. No acute fracture or acute facet abnormality is noted. Mild facet hypertrophic changes and osteophytic changes are seen. The odontoid is within normal limits. Soft tissues and spinal canal: Surrounding soft tissue structures are within normal limits. Upper chest: Visualized lung apices are unremarkable. Other: None IMPRESSION: CT of the head: Chronic atrophic and ischemic changes without acute abnormality. CT of the cervical spine: No acute abnormality noted. Multilevel degenerative changes are noted. Electronically Signed   By: Alcide Clever M.D.   On: 06/02/2023 19:59   CT Cervical Spine Wo Contrast Result Date: 06/02/2023 CLINICAL DATA:  Recent falls with headache and neck pain, initial encounter EXAM: CT HEAD WITHOUT CONTRAST CT CERVICAL SPINE WITHOUT CONTRAST TECHNIQUE: Multidetector CT imaging  of the head and cervical spine was performed following the standard protocol without intravenous contrast. Multiplanar CT image reconstructions of the cervical spine were also generated. RADIATION DOSE REDUCTION: This exam was performed according to the departmental dose-optimization program which includes automated exposure control, adjustment of the mA and/or kV according to patient size and/or use of iterative reconstruction technique. COMPARISON:  03/31/2023 FINDINGS: CT HEAD FINDINGS Brain: No evidence of acute infarction, hemorrhage, hydrocephalus, extra-axial collection or mass lesion/mass effect. Chronic atrophic and ischemic changes are noted. 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 These changes are stable from the prior study. Vascular: No hyperdense vessel or unexpected calcification. Skull: Normal. Negative for fracture or focal lesion. Sinuses/Orbits: No acute finding. Other: None. CT CERVICAL SPINE FINDINGS Alignment: Within normal limits. Skull base and vertebrae: 7 cervical segments are well visualized. Vertebral body height is well maintained. No acute fracture or acute facet abnormality is noted. Mild facet hypertrophic changes and osteophytic changes are seen. The  odontoid is within normal limits. Soft tissues and spinal canal: Surrounding soft tissue structures are within normal limits. Upper chest: Visualized lung apices are unremarkable. Other: None IMPRESSION: CT of the head: Chronic atrophic and ischemic changes without acute abnormality. CT of the cervical spine: No acute abnormality noted. Multilevel degenerative changes are noted. Electronically Signed   By: Alcide Clever M.D.   On: 06/02/2023 19:59   DG Chest 1 View Result Date: 06/02/2023 CLINICAL DATA:  Recent fall with weakness, initial encounter EXAM: PORTABLE CHEST 1 VIEW COMPARISON:  03/31/2023 FINDINGS: Cardiac shadow is within normal limits. Aortic calcifications are  seen. Right jugular central line is noted in satisfactory position. The lungs are well aerated bilaterally. No focal infiltrate is seen. Minimal blunting of the right costophrenic angle is noted likely related to a small effusion. IMPRESSION: Minimal right-sided effusion. Electronically Signed   By: Alcide Clever M.D.   On: 06/02/2023 19:55    Procedures Procedures    Medications Ordered in ED Medications - No data to display  ED Course/ Medical Decision Making/ A&P                                 Medical Decision Making Amount and/or Complexity of Data Reviewed Labs: ordered.   This patient presents to the ED for concern of transient altered mental status, this involves an extensive number of treatment options, and is a complaint that carries with it a high risk of complications and morbidity.  I considered the following differential and admission for this acute, potentially life threatening condition.  The differential diagnosis includes seizure, arrhythmia, metabolic derangement, stroke, infection  MDM:    This is an 83 year old female who presents with a transient altered mental status.  She is back to baseline per family and the patient.  She is currently without any complaint.  Per EMS she was hypotensive although they did not give [place] a number.  She has not had any episodes of hypotension here.  She is not febrile.  Denies infectious symptoms.  She does have a history of seizure but reports compliance with Keppra.  Labs obtained and reviewed.  Largely reassuring.  CT head and cervical spine obtained and show no evidence of acute traumatic injury or obvious abnormality.  EKG shows no evidence of arrhythmia.  Patient was unable to provide urine.  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  (Labs, imaging, consults)  Labs: I Ordered, and personally interpreted labs.  The pertinent results include: CBC, BMP  Imaging Studies ordered: I ordered imaging  studies including CT head, cervical spine, chest x-ray I independently visualized and interpreted imaging. I agree with the radiologist interpretation  Additional history obtained from chart review and daughter at bedside.  External records from outside source obtained and reviewed including prior evaluations  Cardiac Monitoring: The patient was maintained on a cardiac monitor.  If on the cardiac monitor, I personally viewed and interpreted the cardiac monitored which showed an underlying rhythm of: Sinus  Reevaluation: After the interventions noted above, I reevaluated the patient and found that they have :resolved  Social Determinants of Health:  lives independently  Disposition: Discharge  Co morbidities that complicate the patient evaluation  Past Medical History:  Diagnosis Date   CHF (congestive heart failure) (HCC)    Chronic kidney disease    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   Diabetes mellitus without complication (HCC)  type 2   Dyspnea    with exertion   Hypertension    Neuromuscular disorder (HCC)    neuropathy bil feet   PAF (paroxysmal atrial fibrillation) (HCC) 08/2020   Pneumonia    Pulmonary hypertension (HCC)      Medicines No orders of the defined types were placed in this encounter.   I have reviewed the patients home medicines and have made adjustments as needed  Problem List / ED Course: Problem List Items Addressed This Visit   None Visit Diagnoses       Altered awareness, transient    -  Primary                   Final Clinical Impression(s) / ED Diagnoses Final diagnoses:  Altered awareness, transient    Rx / DC Orders ED Discharge Orders     None         Fuquan Wilson, Mayer Masker, MD 06/03/23 [phone]

## 2023-06-03 NOTE — Discharge Instructions (Signed)
You were seen today for brief altered level of awareness.  Your workup today is reassuring.  Continue your seizure medication, Keppra.  Otherwise follow-up closely with primary physician.

## 2023-09-29 IMAGING — US US ABDOMEN LIMITED
1 series · 15 of 25 positions shown · non-contrast
Comparison: CT 04/25/2021

CLINICAL DATA: Abnormal LFTs

EXAM:
ULTRASOUND ABDOMEN LIMITED RIGHT UPPER QUADRANT

[Series 1: us abdomen limited ruq mc & wl · 15 of 103 slices shown]
[im 1/103]
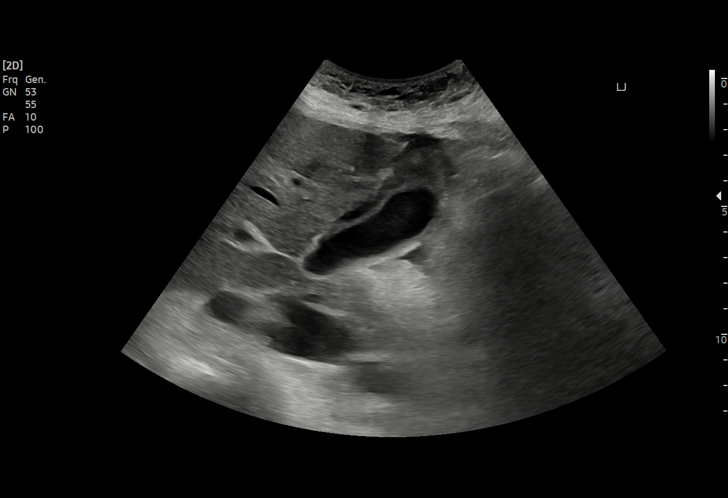
[im 9/103]
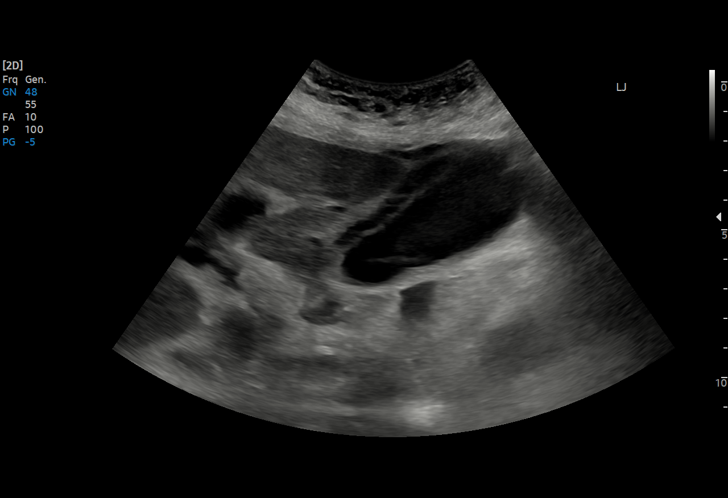
[im 18/103]
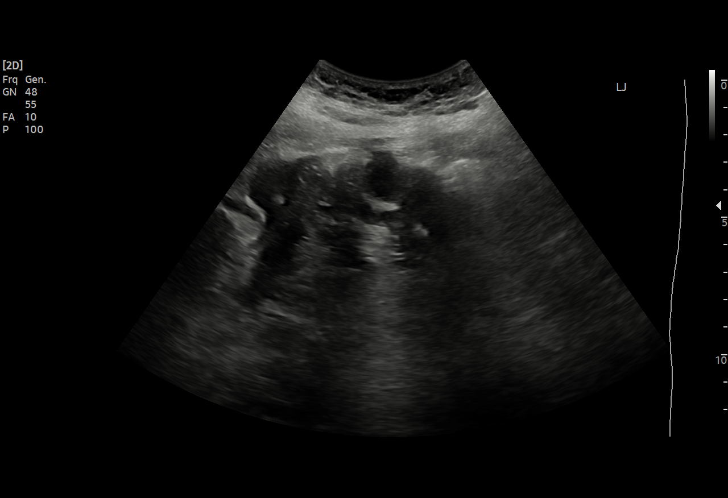
[im 22/103]
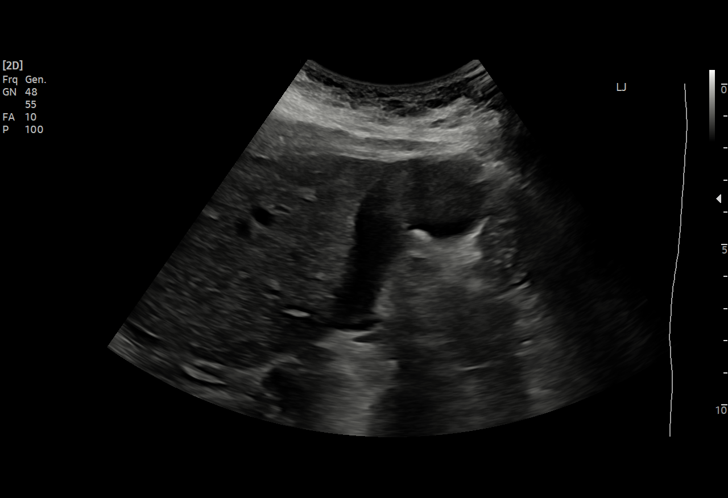
[im 30/103]
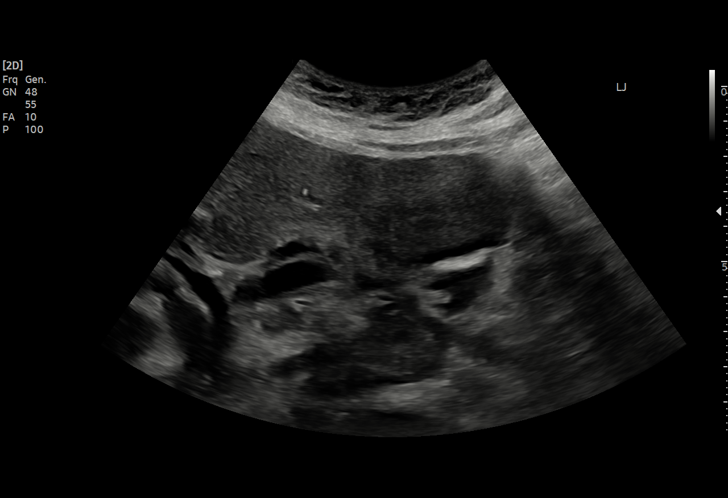
[im 39/103]
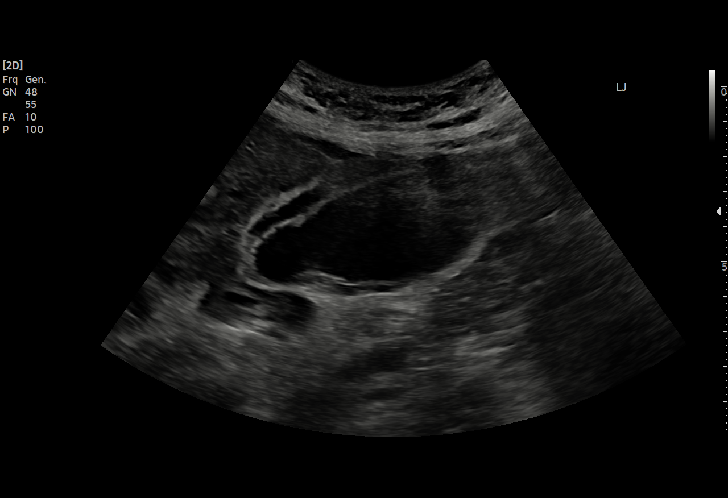
[im 43/103]
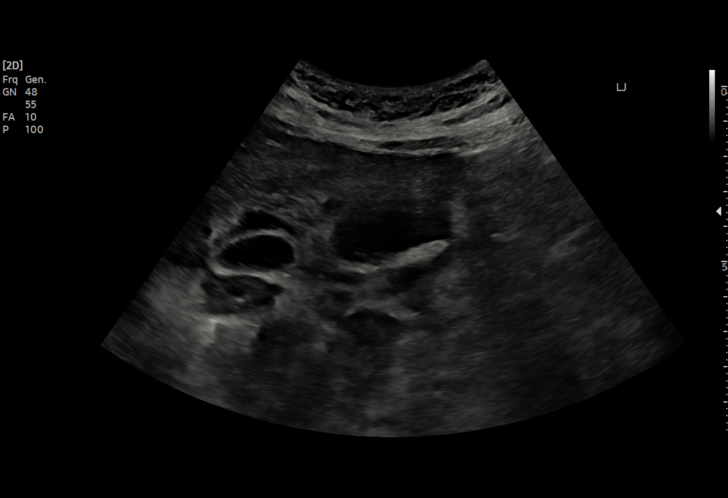
[im 52/103]
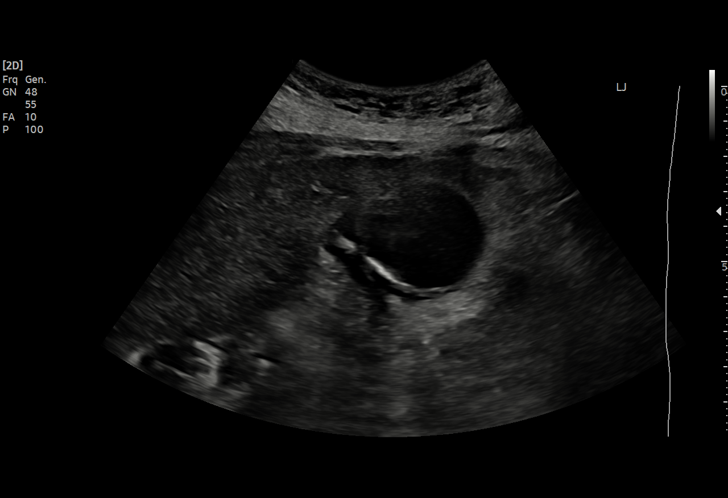
[im 60/103]
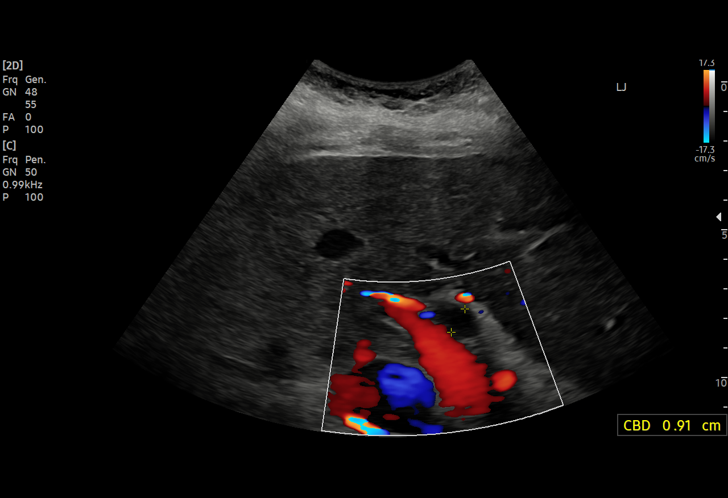
[im 64/103]
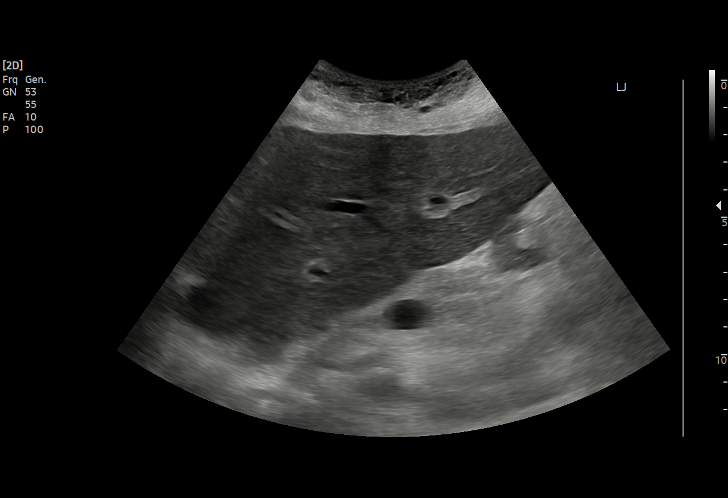
[im 73/103]
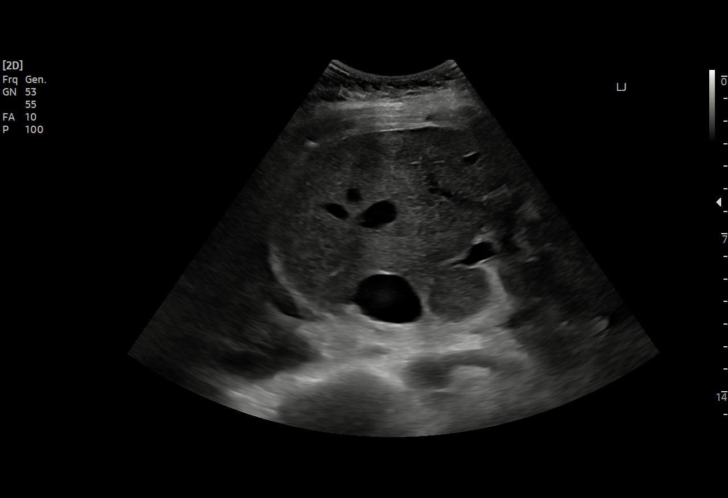
[im 81/103]
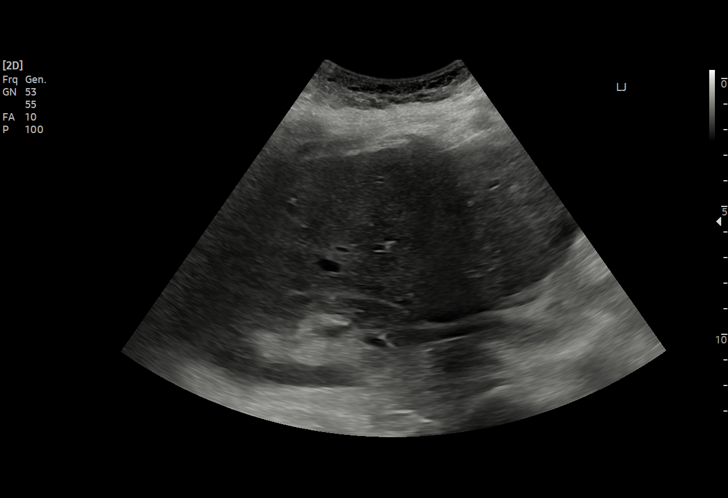
[im 86/103]
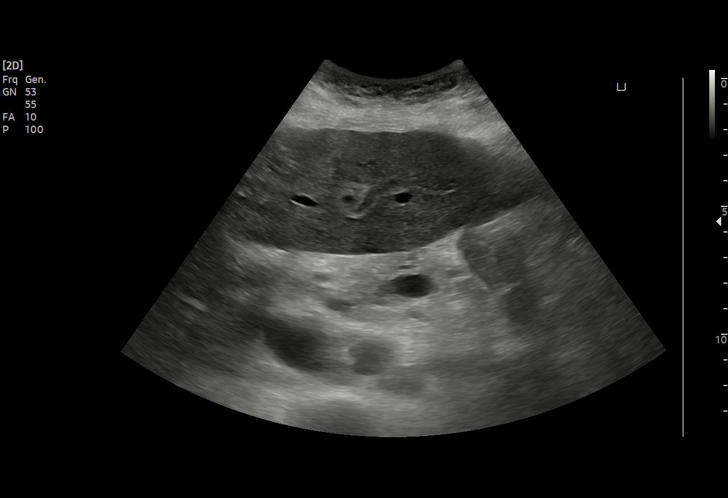
[im 94/103]
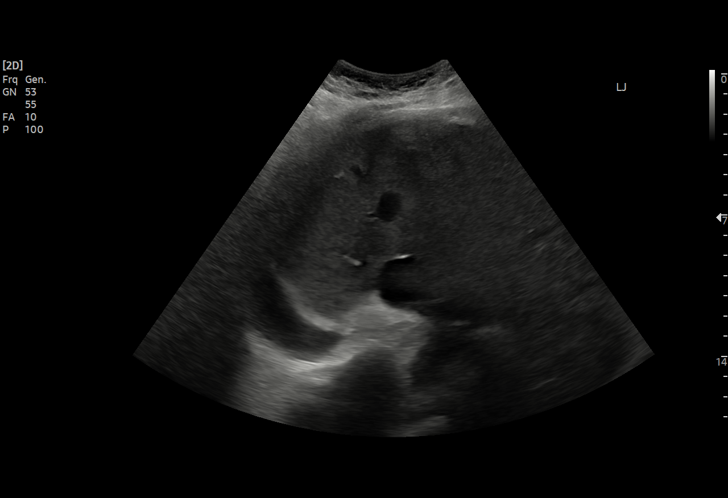
[im 103/103]
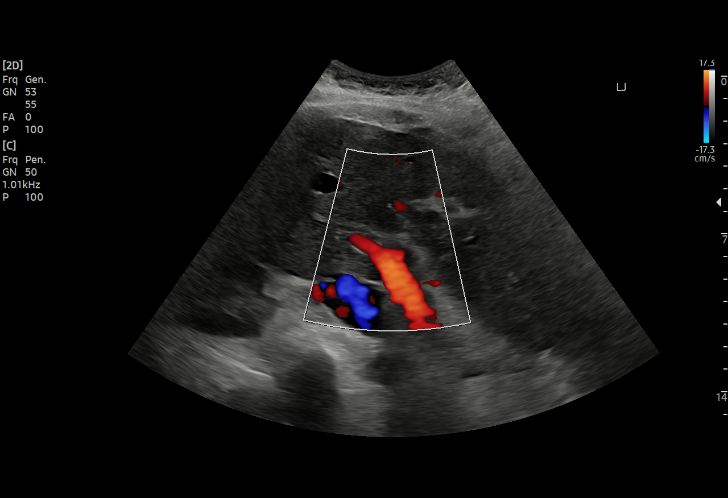

[15 of 25 positions shown; findings below may reference images not displayed]

FINDINGS: Gallbladder:

Gallbladder wall thickening up to 8.9 mm. Moderate pericholecystic
fluid. Sonographer describes no sonographic Murphy's sign however.
Layering echogenic material in the fundus possibly small stones or
sludge.

Common bile duct:

Diameter: 9 mm

Liver:

No focal lesion identified. Within normal limits in parenchymal
echogenicity. Portal vein is patent on color Doppler imaging with
normal direction of blood flow towards the liver.

Other: Small volume abdominal ascites. Right pleural effusion noted.
IMPRESSION: 1. Nonspecific but marked gallbladder wall thickening, with layering
small stones or sludge.
2. Dilated common bile duct.
3. Small volume abdominal ascites including pericholecystic fluid,
and right pleural effusion incidentally noted.

## 2023-10-02 IMAGING — NM NM HEPATO W/GB/PHARM/[PERSON_NAME]
2 series · 12 of 12 positions shown · non-contrast
Comparison: April 28, 2021.

CLINICAL DATA: Right upper quadrant abdominal pain.

EXAM:
NUCLEAR MEDICINE HEPATOBILIARY IMAGING WITH GALLBLADDER EF
TECHNIQUE: Sequential images of the abdomen were obtained [DATE] minutes
following intravenous administration of radiopharmaceutical. After
oral ingestion of Ensure, gallbladder ejection fraction was
determined. At 60 min, normal ejection fraction is greater than 33%.
RADIOPHARMACEUTICALS:  5.4 mCi Jc-44m  Choletec IV

[Series 1: hida scan 1`hr · 3.28mm/px · 6 of 60 frames shown]
[frame 6/60]
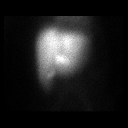
[frame 16/60]
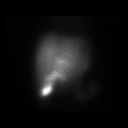
[frame 26/60]
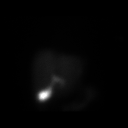
[frame 36/60]
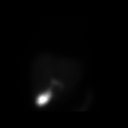
[frame 46/60]
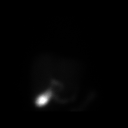
[frame 56/60]
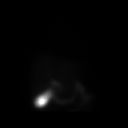

[Series 1: post ensure 2hr · 3.28mm/px · 6 of 60 frames shown]
[frame 6/60]
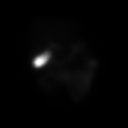
[frame 16/60]
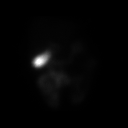
[frame 26/60]
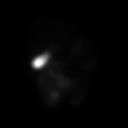
[frame 36/60]
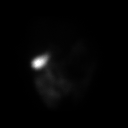
[frame 46/60]
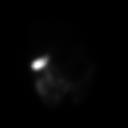
[frame 56/60]
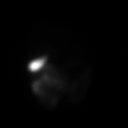

[12 of 12 positions shown; findings below may reference images not displayed]

FINDINGS: Prompt uptake and biliary excretion of activity by the liver is
seen. Gallbladder activity is visualized, consistent with patency of
cystic duct. Biliary activity passes into small bowel, consistent
with patent common bile duct.

Calculated gallbladder ejection fraction is 0%. No pain was reported
with Ensure administration. (Normal gallbladder ejection fraction
with Ensure is greater than 33%.)
IMPRESSION: No observed gallbladder ejection fraction is noted which is clearly
below normal and concerning for biliary dyskinesis.

## 2024-05-03 DEATH — deceased
